# Patient Record
Sex: Male | Born: 1940 | Race: White | Hispanic: No | Marital: Married | Smoking: Former smoker
Health system: Southern US, Community
[De-identification: ages and names within clinical notes are randomized; demographics above are authoritative.]

## PROBLEM LIST (undated history)

## (undated) DIAGNOSIS — M1990 Unspecified osteoarthritis, unspecified site: Secondary | ICD-10-CM

## (undated) DIAGNOSIS — H269 Unspecified cataract: Secondary | ICD-10-CM

## (undated) DIAGNOSIS — I219 Acute myocardial infarction, unspecified: Secondary | ICD-10-CM

## (undated) DIAGNOSIS — K635 Polyp of colon: Secondary | ICD-10-CM

## (undated) DIAGNOSIS — K5790 Diverticulosis of intestine, part unspecified, without perforation or abscess without bleeding: Secondary | ICD-10-CM

## (undated) DIAGNOSIS — I25810 Atherosclerosis of coronary artery bypass graft(s) without angina pectoris: Secondary | ICD-10-CM

## (undated) DIAGNOSIS — Z9289 Personal history of other medical treatment: Secondary | ICD-10-CM

## (undated) DIAGNOSIS — N529 Male erectile dysfunction, unspecified: Secondary | ICD-10-CM

## (undated) DIAGNOSIS — I255 Ischemic cardiomyopathy: Secondary | ICD-10-CM

## (undated) DIAGNOSIS — I38 Endocarditis, valve unspecified: Secondary | ICD-10-CM

## (undated) DIAGNOSIS — K219 Gastro-esophageal reflux disease without esophagitis: Secondary | ICD-10-CM

## (undated) DIAGNOSIS — K449 Diaphragmatic hernia without obstruction or gangrene: Secondary | ICD-10-CM

## (undated) DIAGNOSIS — E785 Hyperlipidemia, unspecified: Secondary | ICD-10-CM

## (undated) DIAGNOSIS — K250 Acute gastric ulcer with hemorrhage: Secondary | ICD-10-CM

## (undated) DIAGNOSIS — I10 Essential (primary) hypertension: Secondary | ICD-10-CM

## (undated) DIAGNOSIS — I517 Cardiomegaly: Secondary | ICD-10-CM

## (undated) DIAGNOSIS — E1121 Type 2 diabetes mellitus with diabetic nephropathy: Secondary | ICD-10-CM

## (undated) DIAGNOSIS — I252 Old myocardial infarction: Secondary | ICD-10-CM

## (undated) DIAGNOSIS — J449 Chronic obstructive pulmonary disease, unspecified: Secondary | ICD-10-CM

## (undated) HISTORY — DX: Unspecified osteoarthritis, unspecified site: M19.90

## (undated) HISTORY — DX: Unspecified cataract: H26.9

## (undated) HISTORY — DX: Gastro-esophageal reflux disease without esophagitis: K21.9

## (undated) HISTORY — DX: Male erectile dysfunction, unspecified: N52.9

## (undated) HISTORY — DX: Atherosclerosis of coronary artery bypass graft(s) without angina pectoris: I25.810

## (undated) HISTORY — DX: Essential (primary) hypertension: I10

## (undated) HISTORY — DX: Type 2 diabetes mellitus with diabetic nephropathy: E11.21

## (undated) HISTORY — DX: Diverticulosis of intestine, part unspecified, without perforation or abscess without bleeding: K57.90

## (undated) HISTORY — DX: Acute myocardial infarction, unspecified: I21.9

## (undated) HISTORY — DX: Personal history of other medical treatment: Z92.89

## (undated) HISTORY — DX: Old myocardial infarction: I25.2

## (undated) HISTORY — DX: Cardiomegaly: I51.7

## (undated) HISTORY — DX: Ischemic cardiomyopathy: I25.5

## (undated) HISTORY — DX: Hyperlipidemia, unspecified: E78.5

## (undated) HISTORY — DX: Chronic obstructive pulmonary disease, unspecified: J44.9

## (undated) HISTORY — DX: Diaphragmatic hernia without obstruction or gangrene: K44.9

---

## 1998-01-11 HISTORY — PX: CORONARY ARTERY BYPASS GRAFT: SHX141

## 1998-11-13 ENCOUNTER — Inpatient Hospital Stay (HOSPITAL_COMMUNITY): Admission: AD | Admit: 1998-11-13 | Discharge: 1998-11-20 | Payer: Self-pay | Admitting: *Deleted

## 1998-11-13 ENCOUNTER — Encounter: Payer: Self-pay | Admitting: *Deleted

## 1998-11-14 ENCOUNTER — Encounter: Payer: Self-pay | Admitting: Thoracic Surgery (Cardiothoracic Vascular Surgery)

## 1998-11-15 ENCOUNTER — Encounter: Payer: Self-pay | Admitting: Thoracic Surgery (Cardiothoracic Vascular Surgery)

## 1998-11-16 ENCOUNTER — Encounter: Payer: Self-pay | Admitting: Thoracic Surgery (Cardiothoracic Vascular Surgery)

## 1998-11-17 ENCOUNTER — Encounter: Payer: Self-pay | Admitting: Cardiothoracic Surgery

## 1998-12-16 ENCOUNTER — Encounter (HOSPITAL_COMMUNITY): Admission: RE | Admit: 1998-12-16 | Discharge: 1999-03-16 | Payer: Self-pay | Admitting: *Deleted

## 2000-07-16 ENCOUNTER — Emergency Department (HOSPITAL_COMMUNITY): Admission: EM | Admit: 2000-07-16 | Discharge: 2000-07-16 | Payer: Self-pay | Admitting: Emergency Medicine

## 2002-03-21 ENCOUNTER — Other Ambulatory Visit: Admission: RE | Admit: 2002-03-21 | Discharge: 2002-03-21 | Payer: Self-pay | Admitting: Family Medicine

## 2007-02-03 ENCOUNTER — Ambulatory Visit (HOSPITAL_BASED_OUTPATIENT_CLINIC_OR_DEPARTMENT_OTHER): Admission: RE | Admit: 2007-02-03 | Discharge: 2007-02-03 | Payer: Self-pay | Admitting: Urology

## 2008-01-12 LAB — HM COLONOSCOPY

## 2008-05-03 ENCOUNTER — Ambulatory Visit: Payer: Self-pay | Admitting: Gastroenterology

## 2008-05-11 DIAGNOSIS — K449 Diaphragmatic hernia without obstruction or gangrene: Secondary | ICD-10-CM

## 2008-05-11 DIAGNOSIS — K635 Polyp of colon: Secondary | ICD-10-CM

## 2008-05-11 HISTORY — DX: Diaphragmatic hernia without obstruction or gangrene: K44.9

## 2008-05-11 HISTORY — DX: Polyp of colon: K63.5

## 2008-05-24 ENCOUNTER — Encounter: Payer: Self-pay | Admitting: Gastroenterology

## 2008-05-24 ENCOUNTER — Ambulatory Visit: Payer: Self-pay | Admitting: Gastroenterology

## 2008-05-27 ENCOUNTER — Encounter: Payer: Self-pay | Admitting: Gastroenterology

## 2008-09-24 ENCOUNTER — Emergency Department (HOSPITAL_COMMUNITY): Admission: EM | Admit: 2008-09-24 | Discharge: 2008-09-25 | Payer: Self-pay | Admitting: Emergency Medicine

## 2010-05-26 NOTE — Op Note (Signed)
Donald Rios, Donald Rios                ACCOUNT NO.:  [id number]   MEDICAL RECORD NO.:  [id number]          PATIENT TYPE:  AMB   LOCATION:  [facility]                         FACILITY:  [facility]   PHYSICIAN:  Mark C. Vernie Ammons, M.D.  DATE OF BIRTH:  1940/12/27   DATE OF PROCEDURE:  02/03/2007  DATE OF DISCHARGE:                               OPERATIVE REPORT   PREOPERATIVE DIAGNOSIS:  Organic erectile dysfunction.   POSTOPERATIVE DIAGNOSIS:  Organic erectile dysfunction.   PROCEDURE:  Three-piece inflatable penile prosthesis placement.   SURGEON:  Mark C. Vernie Ammons, M.D.   ANESTHESIA:  General.   DRAIN:  A 16-French Foley catheter.   BLOOD LOSS:  Minimal.   SPECIMENS:  None.   COMPLICATIONS:  None.   INDICATIONS:  The patient is a 70 year old white male who has organic  erectile dysfunction and has tried the oral agents without improvement.  I have discussed all other treatment options with him and he has elected  to proceed with penile prosthesis.  He understands the risks,  complications and limitations.   DESCRIPTION OF OPERATION:  After informed consent, the patient was  brought to the major OR, placed on the table and administered general  anesthesia in the supine position.  His genitalia was scrubbed for 10  minutes and then prepped with Betadine.  He received Unasyn and  gentamicin prior to the incision.  Official time-out was then performed  and he was sterilely draped.  A 16-French Foley catheter was placed in  the bladder and the bladder was drained.   A midline median raphe scrotal incision was then made and carried down  to expose the corpus cavernosum on both right and left sides.  The ring  retractor was then placed surrounding the scrotum and the supplied hooks  were then used to retract the scrotal skin and exposed the corpora.  Corporotomy was then made on the left side and extended proximally and  distally with curved Mayo scissors.  I then dilated the left corpus  cavernosum with Hagar dilators starting at 12 and progressing up to 15  both proximally and distally without difficulty.  I then irrigated the  corpus and then measured this.  A stay suture was placed on the two  edges of the corporotomy and this was used to determine the location to  measure 2.  He measured 9 cm proximally and 11 cm distally.  The corpus  cavernosum was then again irrigated with antibiotic solution and  attention was directed to the right corpus cavernosum.  This was entered  and dilated as well as measured in an identical fashion.  The  measurements were identical on the right side as they were the left.   Attention was then directed to the external inguinal ring on the right-  hand side.  I could easily palpate the cord and manipulated this  laterally and then drained the bladder completely through the Foley  catheter.  A clamp was then placed against my finger and I used this to  pierce the medial aspect of the floor of the inguinal ring.  I then  used  my index finger to widen the opening and sweep the bladder medially and  clear a pocket against the symphysis pubis.  I then chose the 75 mL  reservoir and placed this in that location after irrigating the location  with antibiotic solution.  I then filled the reservoir with 75 mL of  sterile saline.   Attention was then directed to the penis again and a Titan OTR  prosthesis was chosen that had cylinders of 16 cm length.  I then  applied a 1 cm followed by a 3 cm rear tip extender on each of the two  cylinders prior to implantation.  The cylinders and pump had been  soaking in antibiotic solution.  Using a Mellody Dance needle and the furlough  inserter, the string affixed to the distal aspect of the prosthesis was  then used to place the Grayson needle through the distal aspect of the  glans on both right and left sides and then I inserted the proximal  aspect of the prosthesis into each of the corporotomies and down into   good proximal position.  The strings affixed to the distal aspect of the  prosthesis were then used to direct the remainder of the prosthesis into  the corpora distally.  I then inflated the prosthesis to its full  capacity and noted no kinking of the device, no bulging and good  straight erection with both of the cylinders being palpated just beneath  the glans in the equidistant in the corpus cavernosum.  I then deflated  the device and closed the corporotomies with running 2-0 Vicryl suture.  I also used the holding sutures and tied these to reinforce the  corporotomy closure.  No bleeding was noted from the corporotomies at  the end of the procedure with the device deflated.  I then developed a  scrotal pocket.   The scrotal pocket was developed on the right-hand side as the patient  is right-handed, and the pump was then placed in the scrotal pocket and  secured in that location with a 3-0 chromic suture by reapproximating  the tissues superior to the pump.  I then placed shod clamps on the  tubing near the external inguinal ring and near the pump and excised the  excess tubing.  I used the supplied connectors to connect the tubings  after irrigating these with sterile saline.  I tested the connection and  I noted it was secure.  I then cycled the device which cycled properly.  I then deflated the device and copiously irrigated the incision with  antibiotic solution.  I closed the incision in three layers using 3-0  chromic suture.  One layer was used to reapproximate the deep scrotal  tissue.  The superficial subcutaneous tissue was then reapproximated and  then the skin was finally closed with running 3-0 chromic.  I applied  collodion to the incision as well and left the Foley catheter indwelling  and connected this to close system drainage.  Fluffed Kerlix and scrotal  support were then applied with the penis directed cephalad and the  patient was awakened and taken to recovery  room in stable satisfactory  condition.  He tolerated the procedure well.  There were no  intraoperative complications.   He will be observed overnight and continued on Unasyn and gentamicin as  well as supplied with oral as well as parenteral pain medication.  Upon  discharge, he will be given a prescription for Tylox #40 and maintained  on Cipro 500 mg b.i.d. for  10 days.  He will follow up in my office in  one week.      Mark C. Vernie Ammons, M.D.  Electronically Signed     MCO/MEDQ  D:  02/03/2007  T:  02/03/2007  Job:  [id number]

## 2010-05-29 NOTE — Discharge Summary (Signed)
. [facility]  Patient:    Donald Rios                        MRN: [id number] Adm. Date:  [id number] Disc. Date: 11/20/98 Attending:  Lenise Herald Rios Dictator:   Donald Rios. CC:         Donald Rios. Donald Rios, M.D.             Donald Herald, M.D.             Elvina Sidle, M.D.                           Discharge Summary  HISTORY OF PRESENT ILLNESS:  [name] is a 70 year old male referred to Dr. Jenne Rios from Dr. Tommi Rios office.  The patient has a history of hypertension x 2 years as well as hyperlipidemia.  He recently underwent a screening physical examination by Dr. Milus Rios including a stress Cardiolite.  This was performed October 29, 1998, at Dr. Loraine Rios office and revealed a markedly positive Bruce  protocol exercise stress test with cinographic evidence of inferolateral scar and peri-infarct ischemia with a moderately depressed ejection fraction of 35%. The patient has no history of coronary artery disease.  He worked daily and has no congestive heart failure or anginal symptoms.  He has no significant family history of coronary artery disease.  There is remote tobacco use having quit cigarettes 13 years ago.  Dr. Milus Rios placed the patient on Toprol XL for his blood pressure as well as Lipitor.  He was admitted this hospitalization under the direction of Dr. Loraine Rios service for cardiac catheterization and further evaluation and treatment pending those results.  MEDICATIONS ON ADMISSION: 1. Lipitor 10 mg q.d. 2. Toprol XL 25 mg a day.  ALLERGIES:  No known drug allergies.  SOCIAL HISTORY, FAMILY HISTORY, REVIEW OF SYSTEMS, AND PHYSICAL EXAMINATION: Please see the History and Physical done at the time of admission.  HOSPITAL COURSE:  The patient was admitted for cardiac catheterization on a routine basis on November 13, 1998,  He was found to have a left ventricular function of 35 to 40%, inferior  akinesis, moderate global hypokinesis, and no significant mitral regurgitation.  The patient was noted to have a 60% left main coronary lesion, n 80% LAD ostial lesion, a 90% LAD mid artery lesion, a normal circumflex, a right coronary artery with 90% proximal PDA disease and evidence of less than 50% lesions in several regions of the artery.  A CVTS consultation was obtained with Donald Rios, M.D. who evaluated the patient and his studies and agreed with recommendations for surgical revascularization.  PROCEDURE:  On November 14, 1998, the patient underwent the following procedure:  Coronary artery bypass graft x 5.  The following grafts were placed. 1. Left internal mammary artery sequentially to the diagonal and LAD. 2. Saphenous vein graft sequentially to posterior descending and posterolateral. 3. Free right internal mammary artery to the obtuse marginal #2. Cross clamp time 72 minutes, pump time 136 minutes.  The patient tolerated the procedure well, required no inotropic agents.  He was noted intraoperatively to  have good targets and good conduit.  POSTOPERATIVE HOSPITAL COURSE:  The patient has done quite well.  He tolerated  routine extubation.  He hemodynamics have remained stable, although he did have a postoperative sinus tachycardia that did require several adjustments in medications.  His chest tubes  and other routine lines and catheters were discontinued in a routine manner.  He advanced to the cardiac telemetry unit on  postoperative day #2.  He tolerated routine advancement in postoperative cardiac rehabilitation phase 1 modalities.  His incisions were healing quite well without signs of infection.  He was tolerating diet.  His oxygen was weaned, and he maintained good saturations on room air.  He did have some mild difficulty with  some bronchospasm, but this normalized with routine use of both albuterol and Atrovent hand-held nebulizers with  conversion to a metered dose inhaler at the ime of discharge.  Overall, the patient was evaluated on November 20, 1998, and felt to be quite stable for discharge at that time.  DISCHARGE MEDICATIONS: 1. Combivent 2 puffs q.i.d. 2. Digoxin 0.125 mg q.d. 3. Enteric-coated aspirin 325 mg q.d. 4. Lopressor 50 mg b.i.d. 5. Darvocet-N 100 one to two q.4-6h. p.r.n.  FOLLOWUP:  It was arranged for patient to see Dr. Dorris Rios in three weeks, Dr. Jenne Rios in two weeks.  FINAL DIAGNOSIS: 1. Coronary artery disease.  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 2. Hypertension. 3. Hypercholesterolemia.  DISCHARGE LABORATORY DATA:   Laboratory values at the time of discharge were felt to be stable.  Most recent hemoglobin and hematocrit dated 19 November 1998 were 10 and 30, respectively, with a normal platelet and normal white blood cell count.  Electrolytes were normal also on 19 November 1998 with the exception of a mildly  elevated nonfasting glucose of 175.  BUN and creatinine were 15 and 0.9.  INSTRUCTIONS:  The patient received written instructions regarding medications,  activity, diet, wound care, and followup. DD:  01/14/99 TD:  01/14/99 Job: [id number]

## 2010-05-29 NOTE — Op Note (Signed)
. [facility]  Patient:    Donald Rios                        MRN: [id number] Proc. Date: 11/14/98 Adm. Date:  [id number] Attending:  Lenise Herald H CC:         Lenise Herald, M.D.             Elvina Sidle, M.D.                           Operative Report  PREOPERATIVE DIAGNOSIS:  Left main coronary disease.  POSTOPERATIVE DIAGNOSIS:  Left main coronary disease.  OPERATION PERFORMED:  Median sternotomy, coronary artery bypass grafting x 5 (sequential left internal mammary artery to first diagonal and left anterior descending, free right internal mammary artery to obtuse marginal 2, sequential  saphenous vein graft to posterior descending and posterolateral).  SURGEON:  Salvatore Decent. Dorris Fetch, M.D.  ASSISTANT:  Lynnda Shields, PA-C.  ANESTHESIA:  General.  FINDINGS:  Good quality target vessels, good quality conduits, overall preserved left ventricular function.  CLINICAL NOTE:  Mr. Bowns is a 70 year old gentleman who presented to Dr. Milus Glazier for physical examination.  He underwent an exercise tolerance test with a stress Cardiolite test which revealed significant inferior ischemia, inferior  scarring and an ejection fraction of 35 to 40%.  The patient was referred for cardiac catheterization which revealed an 80% distal left main stenosis as well as significant disease in the LAD system and significant disease in the right coronary system as well.  Of note, the patient had a dual system with a bifurcating vessel one of which gave off several large septal branches and the other gave off several small diagonal branches.  The patient was referred for coronary artery bypass grafting.  The indications, risks, and benefits of the procedure were discussed in detail with the patient.  The patient understood the risks and agreed to proceed.  DESCRIPTION OF PROCEDURE:  Mr. Ksiazek was brought to the preop holding area on November 14, 1998.  Lines were placed to monitor arterial central venous and pulmonary arterial pressure.  EKG leads were placed for continuous telemetry. he patient was taken to the operating room, anesthetized and intubated.  A Foley catheter was placed.  Intravenous antibiotics were administered.  The chest, abdomen and legs were prepped and draped in the usual fashion.  A median sternotomy was performed.  Simultaneously, an incision was made in the  medial aspect of the left leg and the greater saphenous vein was harvested from the ankle to the midcalf.  The greater saphenous vein was of good quality.  The left internal mammary artery was harvested in the standard fashion.  It was a good quality condiut, approximately 2 to 2.5 mm in diameter.  The patient was given 000 units of heparin prior to dividing the distal end of the mammary artery.  The right internal mammary artery then was harvested in the same fashion.  The remainder f the heparin was given before dividing the distal end of the right internal mammary artery.  It was likewise a good graft approximately 2 mm in diameter.  There was excellent flow through the cut end of the right mammary.  After ensuring that all branches had been clipped, the right internal mammary artery then was divided proximally.  A right ankle clamp was placed across the proximal mammary artery.  The graft was divided  and placed in a papaverine heparinized saline solution. he proximal stump then was ligated with a 2-0 silk suture ligature.  The pericardium was opened.  The ascending aorta was palpated.  It was normal size diameter and there was no palpable atherosclerotic disease.  The aorta was cannulated via concentric 2-0 Ethibond nonpledgeted pursestring sutures.  The dual stage venous cannula was place via pursestring suture in the right atrial appendage.  Cardiopulmonary bypass was instituted and the patient was cooled to 32 degrees Celsius.  The  coronary arteries were inspected and anastomotic sites chosen.  The conduits were inspected and cut to length.  A foam pad was placed n the pericardium to protect the left phrenic nerve.  A temperature probe was placed in the myocardial septum and a cardioplegia cannula was placed in the ascending  aorta.  The aorta was crossclamped.  The left ventricle was emptied via the aortic root  vent.  Cardiac arrest then was achieved with a combination of cold antegrade cardioplegia and topical iced saline.  After achieving a complete diastolic arrest and myocardial septal temperature of 90 degrees, the following distal anastomoses were performed.  First a reversed saphenous vein was placed sequentially to the posterior descending coronary artery and posterolateral branches of the right coronary artery.  The posterior descending was a 1.5 mm good quality vessel as was the posterolateral  branch.  The anastomosis to the posterior descending coronary artery was a side-to-side anastomosis performed off a side branch of the vein graft.  The anastomosis was performed with a running 7-0 Prolene suture.  An end-to-side anastomosis then was performed to the posterolateral branch also with a running 7-0 Prolene suture.  Both anastomoses were probed proximally and distally with 1.5 m probes prior to tying the sutures.  There was good flow down the vein graft. Additional cardioplegia was administered down the vein graft.  Next the free right internal mammary artery was prepared by spatulating its distal end.  It was anastomosed end-to-side to the second obtuse marginal branch of the left circumflex coronary artery.  This was compromised by the 80% distal left main stenosis.  This anastomosis was performed with a running 8-0 Prolene suture and  again was probed proximally and distally prior to tying the suture.  The second  obtuse marginal was a 1.5 mm good quality vessel.  Additional  cardioplegia was administered via the vein graft and via the aortic root.  Next, the left internal mammary artery was brought through a window in the  pericardium anterior to the left phrenic nerve and was prepared to be sequentially grafted to the first diagonal and LAD.  This was actually a dual LAD system with the first diagonal being a large branch which gave off several smaller diagonals and the LAD proper being an equal sized vessel that gave off septal perforators. It did not run completely to the apex of the heart.  There was a very small branch of the LAD which did run in the normal course along the ventricular septum to the apex of the heart.  This branch was not graftable to its extremely small size. The LAD then was anastomosed sequentially to the diagonal and LAD.  A side-to-side anastomosis was performed to the diagonal and an end-to-side anastomosis was perfomred to the LAD.  Both anastomoses were performed with running 8-0 Prolene  sutures.  Both were probed proximally and distally prior to tying the sutures. At completion of the mammary to LAD anastomosis, the bulldog clamp was  removed from the mammary artery.  Immediate and rapid septal rewarming was noted.  Both anastomoses were inspected for hemostasis.  The mammary pedicle was tacked to the epicardial surface of the heart with 6-0 Prolene sutures.  The aortic crossclamp was removed.  Total crossclamp time was 72 minutes.  The patient resumed contractions spontaneously and did not require defibrillation.  A partial occlusion clamp was placed on the ascending aorta.  The cardioplegia cannula was removed.  The proximal anastomoses were performed to 4.0 mm punch aortotomies.  The right internal mammary artery anastomosis was performed with  running 7-0 Prolene suture and the vein graft with a running 6-0 Prolene suture. As the vein graft proximal was completed, a bulldog clamp was removed from the ree right  internal mammary artery graft.  There was backbleeding into the aorta which allowed evacuation of all the air before removal of the partial occlusion clamp. All proximal and distal anastomoses were inspected for hemostasis.  Epicardial pacing wires were placed on the right ventricle and right atrium.  The patient as rewarmed.  Core temperature reached 37 degrees Celsius.  The patient was weaned  from bypass at that time.  He was in sinus tachycardia on no inotropic support t the time of separation from bypass.  Initial cardiac output was 6 liters per minute.  The patient remained hemodynamically stable throughout the postbypass period.  A test dose of protamine was administered and was well tolerated.  The atrial and aortic cannulae were removed.  The remainder of the protamine was administered without incident.  An addition dose of antibiotics was given.  The chest was irrigtaed with 1 liter of warm normal saline containing 1 gm of vancomycin. Hemostasis was achieved.  The chest was irrigated with 1 liter of warm normal saline containing 1 gm of vancomycin.  Bilateral pleural and two mediastinal chest tubes were placed through separate subcostal incisions.  The mediastinal fat was reapproximated over the heart to prevent adherence to the sternum.  The sternum was closed with stainless steel wires.  The pectoralis fascia was closed with a running #1 Vicryl suture.  Both the chest and leg subcutaneous tissues was closed with  running 2-0 Vicryl suture and the skin was closed with 3-0 Vicryl subcuticular suture.  All sponge, needle and instrument counts were correct at the completion of the procedure.  There were no intraoperative complications.  The patient was taken from the operating room to the surgical intensive care unit intubated in stable  condition. DD:  11/14/98 TD:  11/17/98 Job: [id number]

## 2010-05-29 NOTE — Cardiovascular Report (Signed)
[place]  Patient:    Donald Rios                        MRN: [id number] Proc. Date: 11/13/98 Adm. Date:  [id number] Attending:  Darlin Priestly CC:         Dr. Faustino Congress                        Cardiac Catheterization  PROCEDURES PERFORMED: 1. Left heart catheterization. 2. Coronary angiography. 3. Left ventriculogram.  COMPLICATIONS:  None.  INDICATIONS:  [name] is a 70 year old white male, patient of Dr. Faustino Congress, with a history of hypertension and hyperlipidemia.  He recently underwent a screening physical examination by Dr. Faustino Congress, including a stress Cardiolite  which revealed inferolateral scar with peri-infarct ischemia and a mildly depressed EF at 35%.  He was subsequently referred for cardiac catheterization.  DESCRIPTION OF PROCEDURE:  After giving informed written consent, the patient was brought to the cardiac catheterization laboratory where his right and left groins were shaved, prepped, and draped in the usual sterile fashion.  ECG monitoring as established.  Using the modified Seldinger technique, a 6-French arterial sheath was inserted into the right femoral artery.  Six French diagnostic catheters were then used to perform diagnostic angiography. This revealed a medium sized left main with a 60% distal stenotic lesion.  The LAD was a medium sized vessel which coursed the apex and gave rise to two diagonal branches.  The LAD was noted to have an 80% ostial stenotic lesion and a 90% mid vessel stenotic lesion after the takeoff of the first diagonal.  The first diagonal was a large vessel which bifurcated distally and had no significant disease.  The second diagonal was a small vessel with no significant disease.  The left circumflex was a medium sized vessel which coursed in the AV groove and gave rise to one large bifurcating obtuse marginal.  The circumflex proper had o significant disease.  The  first OM was a medium sized vessel with no significant disease.  The right coronary artery was a medium sized vessel which bifurcated into the PDA and posterolateral branch and was noted to be dominant.  The RCA had a 30% early mid and a 30% late mid stenotic lesion.  The PDA was a medium sized vessel with no significant disease.  The PLA was a large vessel with a 90% ostial and a 30% mid vessel stenotic lesion.  Left ventriculogram revealed moderately depressed LV systolic function with an estimated ejection fraction of 35% to 40%.  There is inferior akinesis and moderate global hypokinesis.  There is no significant mitral regurgitation.  HEMODYNAMICS:  Systemic pressure 163/75, LV systemic pressure 163/8, LV EDP 15.   CONCLUSIONS: 1. 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 2. Moderately depressed left ventricular systolic function with wall motion    abnormalities, as noted above.  DISCUSSION:  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  Other than a history of hypertension and hyperlipidemia, he has no other known risk factors.  I believe he would be best managed by coronary artery bypass graft surgery secondary to his left main disease.  We will begin him on IV heparin and ask CVTS to see him in consultation. DD:  11/13/98 TD:  11/15/98 Job: [id number]

## 2010-07-27 ENCOUNTER — Encounter: Payer: Self-pay | Admitting: Family Medicine

## 2010-07-27 DIAGNOSIS — I340 Nonrheumatic mitral (valve) insufficiency: Secondary | ICD-10-CM | POA: Insufficient documentation

## 2010-07-27 DIAGNOSIS — E119 Type 2 diabetes mellitus without complications: Secondary | ICD-10-CM | POA: Insufficient documentation

## 2010-07-27 DIAGNOSIS — I517 Cardiomegaly: Secondary | ICD-10-CM | POA: Insufficient documentation

## 2010-07-27 DIAGNOSIS — E782 Mixed hyperlipidemia: Secondary | ICD-10-CM | POA: Insufficient documentation

## 2010-07-27 DIAGNOSIS — K449 Diaphragmatic hernia without obstruction or gangrene: Secondary | ICD-10-CM | POA: Insufficient documentation

## 2010-07-27 DIAGNOSIS — I10 Essential (primary) hypertension: Secondary | ICD-10-CM | POA: Insufficient documentation

## 2010-07-27 DIAGNOSIS — I071 Rheumatic tricuspid insufficiency: Secondary | ICD-10-CM | POA: Insufficient documentation

## 2010-07-27 DIAGNOSIS — I25810 Atherosclerosis of coronary artery bypass graft(s) without angina pectoris: Secondary | ICD-10-CM | POA: Insufficient documentation

## 2010-07-27 DIAGNOSIS — K5790 Diverticulosis of intestine, part unspecified, without perforation or abscess without bleeding: Secondary | ICD-10-CM | POA: Insufficient documentation

## 2010-07-27 DIAGNOSIS — H9190 Unspecified hearing loss, unspecified ear: Secondary | ICD-10-CM | POA: Insufficient documentation

## 2010-07-27 DIAGNOSIS — K219 Gastro-esophageal reflux disease without esophagitis: Secondary | ICD-10-CM | POA: Insufficient documentation

## 2010-07-27 DIAGNOSIS — M109 Gout, unspecified: Secondary | ICD-10-CM | POA: Insufficient documentation

## 2010-08-07 DIAGNOSIS — Z9289 Personal history of other medical treatment: Secondary | ICD-10-CM

## 2010-08-07 HISTORY — DX: Personal history of other medical treatment: Z92.89

## 2010-09-07 HISTORY — PX: TRANSTHORACIC ECHOCARDIOGRAM: SHX275

## 2010-10-01 LAB — POCT I-STAT 4, (NA,K, GLUC, HGB,HCT)
Glucose, Bld: 119 — ABNORMAL HIGH
HCT: 46
Hemoglobin: 15.6
Operator id: 268271
Potassium: 4.4
Sodium: 141

## 2011-01-13 DIAGNOSIS — M109 Gout, unspecified: Secondary | ICD-10-CM | POA: Diagnosis not present

## 2011-01-13 DIAGNOSIS — E119 Type 2 diabetes mellitus without complications: Secondary | ICD-10-CM | POA: Diagnosis not present

## 2011-01-13 DIAGNOSIS — J449 Chronic obstructive pulmonary disease, unspecified: Secondary | ICD-10-CM | POA: Diagnosis not present

## 2011-01-18 DIAGNOSIS — N390 Urinary tract infection, site not specified: Secondary | ICD-10-CM | POA: Diagnosis not present

## 2011-02-02 DIAGNOSIS — E782 Mixed hyperlipidemia: Secondary | ICD-10-CM | POA: Diagnosis not present

## 2011-02-02 DIAGNOSIS — Z79899 Other long term (current) drug therapy: Secondary | ICD-10-CM | POA: Diagnosis not present

## 2011-02-05 DIAGNOSIS — E119 Type 2 diabetes mellitus without complications: Secondary | ICD-10-CM | POA: Diagnosis not present

## 2011-02-05 DIAGNOSIS — I2510 Atherosclerotic heart disease of native coronary artery without angina pectoris: Secondary | ICD-10-CM | POA: Diagnosis not present

## 2011-05-18 DIAGNOSIS — E785 Hyperlipidemia, unspecified: Secondary | ICD-10-CM | POA: Diagnosis not present

## 2011-05-18 DIAGNOSIS — R5381 Other malaise: Secondary | ICD-10-CM | POA: Diagnosis not present

## 2011-05-18 DIAGNOSIS — R5383 Other fatigue: Secondary | ICD-10-CM | POA: Diagnosis not present

## 2011-05-18 DIAGNOSIS — E119 Type 2 diabetes mellitus without complications: Secondary | ICD-10-CM | POA: Diagnosis not present

## 2011-06-28 ENCOUNTER — Encounter: Payer: Self-pay | Admitting: Dietician

## 2011-06-28 ENCOUNTER — Encounter: Payer: Medicare Other | Attending: Family Medicine | Admitting: Dietician

## 2011-06-28 DIAGNOSIS — E119 Type 2 diabetes mellitus without complications: Secondary | ICD-10-CM | POA: Insufficient documentation

## 2011-06-28 DIAGNOSIS — Z713 Dietary counseling and surveillance: Secondary | ICD-10-CM | POA: Insufficient documentation

## 2011-06-28 NOTE — Progress Notes (Signed)
  Medical Nutrition Therapy:  Appt start time: 1630 end time:  1730.   Assessment:  Primary concerns today: Learn why my blood glucose might be going low in the morning after breakfast and dropping to 76 at that time. Gives a history of type 2 diabetes for about 4-6 years.  DM education was provided by his MD and the resources that he has read and on-line self-education.  Notes that he limits himself to 45 gm of Carb per meal as a rule.  MEDICATIONS: Med review completed   BLOOD GLUCOSE: Checking fasting levels 2-3-4 days per week.  He is unsure of his current A1C level.  HYPOGLYCEMIA:  Reports some recent episodes of getting tired and somewhat weak mid morning while chopping wood. At one episode, he checked his blood glucose and found it to be 76.  He has found that if he eats lunch early, this takes away the tired feeling and he has the energy he needs to go about his afternoon.  DIETARY INTAKE:  Usual eating pattern includes 3 meals and rare snacks per day.  Everyday foods include meat/protein, vegetables, some fruit.  Avoided foods include bread and sweets.    24-hr recall:  B ( AM): 6:30 Oatmeal (1-1.5 packagespackage of instant and add a few raisins), coffee (1/2 and 1/2 nonfat)  OR cheerios (regular 1 cup  with skim milk 4 oz) OR sausage biscuit prepared sausage with biscuit OR waffle 1 regular with made at home, frozen mix with syrup (sugar free).  Every 2 weeks has eggs, bacon, and grits (get these at the restaurant). Snk ( AM): none  L ( PM): 12:00 PM- roast beef (3 oz), frito chips 5-6 and 2 bottles of water.  Uses leftovers and always has on hand, pork skins with pimento cheese on these and have water. Snk ( PM): none D ( PM): 6:00 PM Peppers, onions, chicken. Steak, salad and baked beans, or garlic bread.water or if out to dinner will have unsweetened tea. Snk ( PM): none Beverages:coffee, water, diet coke and bottled green tea,  occ beer. Tends to eat out Saturday and Sunday  nights and will bring back the leftovers.  Usual physical activity: working around the house and will walk on the treadmill 2-3 days per week for 20-30 minutes.  Estimated energy needs:HT: 68.5 in  WT: 159.1 lb  BMI: 23.9 kg/m2   1800-2000 calories (depending on activity level) 195-200 g carbohydrates 130-135 g protein 46-48 g fat  Progress Towards Goal(s):  In progress.   Nutritional Diagnosis:  [place]-2.1 Inpaired nutrition utilization As related to glucose.  As evidenced by diagnosis of type 2 diabetes, use of Glumetza, having episodes of low glucose with increased activity..    Intervention:  Nutrition Review of the carb restricted diet along with the other factors that influence blood glucose and how they along with diet influence blood glucose levels. Advised that on the days that he is doing more physical activity/labor, he Christohper Dube need to add another 15 gm of carbohydrate along with some protein to his breakfast or he Shayonna Ocampo need to plan to have a snack at the mid-morning time during his work to include at least 15 gm of CHO and some protein.  Handouts given during visit include:  Living Well with Diabetes  Carb Counting Reference  Controlling Blood Glucose  Monitoring/Evaluation:  Dietary intake, exercise, blood glucose levesl, and body weight as needed.

## 2011-06-28 NOTE — Patient Instructions (Addendum)
   On mornings you are going out to work, you might add a slice of whole wheat or whole grain toast, with the PB OR carry a pack of crackers for a mid-morning snack and plan to stop and have the snack.  (15 grams + protein).  Always [place] the higher fiber crackers, breads.  When at the house, check the blood glucose levels to make sure it is a low glucose reaction and not the caffeine from too much coffee.  Continue to stay as active as possible.  Use the stairs, keep up the outside work.  Continue to monitor your diet as you are doing.  Keep the carbs at about 45 grams of carb per meal and a snack as need of 15 grams of carb and a protein.

## 2011-06-29 ENCOUNTER — Encounter: Payer: Self-pay | Admitting: Dietician

## 2011-08-11 DIAGNOSIS — E782 Mixed hyperlipidemia: Secondary | ICD-10-CM | POA: Diagnosis not present

## 2011-08-11 DIAGNOSIS — E119 Type 2 diabetes mellitus without complications: Secondary | ICD-10-CM | POA: Diagnosis not present

## 2011-08-16 DIAGNOSIS — M6281 Muscle weakness (generalized): Secondary | ICD-10-CM | POA: Diagnosis not present

## 2011-08-16 DIAGNOSIS — I10 Essential (primary) hypertension: Secondary | ICD-10-CM | POA: Diagnosis not present

## 2011-08-16 DIAGNOSIS — E119 Type 2 diabetes mellitus without complications: Secondary | ICD-10-CM | POA: Diagnosis not present

## 2011-08-16 DIAGNOSIS — M353 Polymyalgia rheumatica: Secondary | ICD-10-CM | POA: Diagnosis not present

## 2011-08-30 DIAGNOSIS — Z125 Encounter for screening for malignant neoplasm of prostate: Secondary | ICD-10-CM | POA: Diagnosis not present

## 2011-08-30 DIAGNOSIS — E291 Testicular hypofunction: Secondary | ICD-10-CM | POA: Diagnosis not present

## 2011-09-23 DIAGNOSIS — M353 Polymyalgia rheumatica: Secondary | ICD-10-CM | POA: Diagnosis not present

## 2011-09-23 DIAGNOSIS — M109 Gout, unspecified: Secondary | ICD-10-CM | POA: Diagnosis not present

## 2011-12-06 DIAGNOSIS — E1139 Type 2 diabetes mellitus with other diabetic ophthalmic complication: Secondary | ICD-10-CM | POA: Diagnosis not present

## 2012-02-01 DIAGNOSIS — J019 Acute sinusitis, unspecified: Secondary | ICD-10-CM | POA: Diagnosis not present

## 2012-02-10 DIAGNOSIS — E119 Type 2 diabetes mellitus without complications: Secondary | ICD-10-CM | POA: Diagnosis not present

## 2012-02-10 DIAGNOSIS — Z79899 Other long term (current) drug therapy: Secondary | ICD-10-CM | POA: Diagnosis not present

## 2012-02-10 DIAGNOSIS — E782 Mixed hyperlipidemia: Secondary | ICD-10-CM | POA: Diagnosis not present

## 2012-02-15 DIAGNOSIS — E782 Mixed hyperlipidemia: Secondary | ICD-10-CM | POA: Diagnosis not present

## 2012-02-15 DIAGNOSIS — I10 Essential (primary) hypertension: Secondary | ICD-10-CM | POA: Diagnosis not present

## 2012-02-15 DIAGNOSIS — Z951 Presence of aortocoronary bypass graft: Secondary | ICD-10-CM | POA: Diagnosis not present

## 2012-04-11 ENCOUNTER — Ambulatory Visit (INDEPENDENT_AMBULATORY_CARE_PROVIDER_SITE_OTHER): Payer: Medicare Other | Admitting: Family Medicine

## 2012-04-11 ENCOUNTER — Encounter: Payer: Self-pay | Admitting: Family Medicine

## 2012-04-11 VITALS — BP 120/70 | HR 58 | Temp 97.7°F | Resp 14 | Wt 165.0 lb

## 2012-04-11 DIAGNOSIS — E785 Hyperlipidemia, unspecified: Secondary | ICD-10-CM | POA: Diagnosis not present

## 2012-04-11 DIAGNOSIS — I10 Essential (primary) hypertension: Secondary | ICD-10-CM

## 2012-04-11 DIAGNOSIS — M109 Gout, unspecified: Secondary | ICD-10-CM

## 2012-04-11 DIAGNOSIS — E119 Type 2 diabetes mellitus without complications: Secondary | ICD-10-CM | POA: Diagnosis not present

## 2012-04-11 LAB — HEMOGLOBIN A1C
Hgb A1c MFr Bld: 6 % — ABNORMAL HIGH (ref <5.7)
Mean Plasma Glucose: 126 mg/dL — ABNORMAL HIGH (ref <117)

## 2012-04-11 LAB — HEPATIC FUNCTION PANEL
ALT: 14 U/L (ref 0–53)
AST: 19 U/L (ref 0–37)
Bilirubin, Direct: 0.2 mg/dL (ref 0.0–0.3)
Indirect Bilirubin: 0.9 mg/dL (ref 0.0–0.9)
Total Bilirubin: 1.1 mg/dL (ref 0.3–1.2)

## 2012-04-11 LAB — LIPID PANEL
HDL: 51 mg/dL (ref >39)
Total CHOL/HDL Ratio: 2.4 Ratio
Triglycerides: 66 mg/dL (ref <150)

## 2012-04-11 LAB — BASIC METABOLIC PANEL
Chloride: 107 mEq/L (ref 96–112)
Creat: 1.06 mg/dL (ref 0.50–1.35)
Potassium: 4.8 mEq/L (ref 3.5–5.3)

## 2012-04-11 LAB — URIC ACID: Uric Acid, Serum: 4.6 mg/dL (ref 4.0–7.8)

## 2012-04-11 NOTE — Progress Notes (Signed)
Subjective:     Patient ID: Donald Rios, male   DOB: Nov 08, 1940, 72 y.o.   MRN: [id number]  HPI Patient is here for followup of his multiple medical problems.  #1 is diabetes mellitus type 2. He is currently taking metformin 500 mg by mouth every morning. He reports fasting blood sugars of 80-110, and two-hour postprandial sugars less than 130.  He denies any hypoglycemic episodes.  He is strictly adhering to a low carbohydrate diet.  #2 is hypertension- he is currently on Norvasc 10 mg by mouth daily, Lotensin 40 mg by mouth daily, Catapres 0.2 mg twice a day, and Lopressor 50 mg by mouth twice a day.  He denies any chest pain shortness of breath or dyspnea on exertion. He is followed by Dr. [place] Hilty at [facility] heart [facility] vascular.  He does have past medical history significant for mitral regurgitation.  He denies any palpitations.  #3 is hyperlipidemia-he is currently on Zocor40 mg by mouth daily. He denies any myalgias or right upper quadrant pain.  #4  gout he is currently on allopurinol 300 mg by mouth daily. He has not had a flare in 6 months.  He seldom needs to use colchicine.  He also has a well-circumscribed plaque on his knee medial left knee. It has a red raised serpiginous border with central scale. He described the counter cortisone cream without relief.  He has been there for approximately 2-3.PMH Current Outpatient Prescriptions on File Prior to Visit  Medication Sig Dispense Refill  . amLODipine (NORVASC) 10 MG tablet Take 10 mg by mouth daily.        Marland Kitchen aspirin 81 MG tablet Take 81 mg by mouth daily.        . benazepril (LOTENSIN) 40 MG tablet Take 40 mg by mouth daily.        . calcium carbonate (OS-CAL) 600 MG TABS Take 600 mg by mouth 2 (two) times daily with a meal.        . metFORMIN (GLUMETZA) 500 MG (MOD) 24 hr tablet Take 500 mg by mouth daily with breakfast.        . metoprolol (LOPRESSOR) 50 MG tablet Take 50 mg by mouth 2 (two) times daily.        .  simvastatin (ZOCOR) 80 MG tablet Take 80 mg by mouth at bedtime. Having some muscular soreness and has decreased the dose to 40 mg at night.       No current facility-administered medications on file prior to visit.   History   Social History  . Marital Status: Married    Spouse Name: N/A    Number of Children: N/A  . Years of Education: N/A   Occupational History  . Not on file.   Social History Main Topics  . Smoking status: Former Smoker    Quit date: 11/04/1985  . Smokeless tobacco: Never Used  . Alcohol Use: Yes  . Drug Use: Yes  . Sexually Active: Not on file   Other Topics Concern  . Not on file   Social History Narrative  . No narrative on file    Review of Systems Review of systems is otherwise negative    Objective:   Physical Exam  Constitutional: He appears well-developed and well-nourished.  HENT:  Head: Normocephalic.  Eyes: Conjunctivae are normal. Pupils are equal, round, and reactive to light.  Neck: Normal range of motion. Neck supple. No JVD present. No thyromegaly present.  Cardiovascular: Normal rate and regular rhythm.  Murmur (1/6 NEAR MITRAL VALVE.) heard. Pulmonary/Chest: Effort normal and breath sounds normal. No respiratory distress. He has no wheezes. He has no rales.  Abdominal: Soft. Bowel sounds are normal. He exhibits no distension. There is no tenderness. There is no rebound and no guarding.  Lymphadenopathy:    He has no cervical adenopathy.   he has a faint right carotid bruit.  There is a 5 cm, well-circumscribed red plaque with white scale on his medial left knee.  Clinically appears to be T. Corporis.  KOH performed in the office is negative     Assessment:     Diabetes mellitus type 2 Hypertension Hyperlipidemia Gout Tinea corporis    Plan:    1. Gout Well-controlled continue allopurinol 300 mg by mouth daily.  Ensure uric acid level is less than 6. - Uric Acid  2. Type II or unspecified type diabetes mellitus  without mention of complication, not stated as uncontrolled Appears well-controlled. Goal A1c is less than 6.5 - Hemoglobin A1c  3. Essential hypertension, benign Controlled.  Continue current medicines at current doses. - Basic Metabolic Panel  4. Other and unspecified hyperlipidemia Goal LDL is less than 70.  Titrate meds to achieve that - Basic Metabolic Panel - Hepatic Function Panel - Lipid Panel  #4 tinea corporis-lotrimin BID x 14 days. Biopsy if persistent.

## 2012-05-26 ENCOUNTER — Other Ambulatory Visit: Payer: Self-pay | Admitting: Family Medicine

## 2012-05-26 MED ORDER — METFORMIN HCL 500 MG PO TABS: 500.0000 mg | ORAL_TABLET | Freq: Every day | ORAL | Status: DC

## 2012-05-26 MED ORDER — METFORMIN HCL ER (MOD) 500 MG PO TB24: 500.0000 mg | ORAL_TABLET | Freq: Every day | ORAL | Status: DC

## 2012-05-26 NOTE — Telephone Encounter (Signed)
Refilled med

## 2012-05-26 NOTE — Telephone Encounter (Signed)
Rx Refilled  

## 2012-08-11 ENCOUNTER — Telehealth: Payer: Self-pay | Admitting: Internal Medicine

## 2012-08-11 NOTE — Telephone Encounter (Signed)
Message forwarded to J. Elkins, RN.  

## 2012-08-11 NOTE — Telephone Encounter (Signed)
Needing a lab order sent to him before his appt on 08/18/12 with Dr.Hilty

## 2012-08-14 ENCOUNTER — Encounter: Payer: Self-pay | Admitting: *Deleted

## 2012-08-14 ENCOUNTER — Other Ambulatory Visit: Payer: Self-pay | Admitting: *Deleted

## 2012-08-14 DIAGNOSIS — E785 Hyperlipidemia, unspecified: Secondary | ICD-10-CM

## 2012-08-14 DIAGNOSIS — Z79899 Other long term (current) drug therapy: Secondary | ICD-10-CM

## 2012-08-14 DIAGNOSIS — E119 Type 2 diabetes mellitus without complications: Secondary | ICD-10-CM

## 2012-08-14 NOTE — Telephone Encounter (Signed)
La orders placed & patient notified. Lab slips mailed to patient.

## 2012-08-17 ENCOUNTER — Telehealth: Payer: Self-pay | Admitting: Family Medicine

## 2012-08-17 DIAGNOSIS — E119 Type 2 diabetes mellitus without complications: Secondary | ICD-10-CM | POA: Diagnosis not present

## 2012-08-17 DIAGNOSIS — E785 Hyperlipidemia, unspecified: Secondary | ICD-10-CM | POA: Diagnosis not present

## 2012-08-17 DIAGNOSIS — Z79899 Other long term (current) drug therapy: Secondary | ICD-10-CM | POA: Diagnosis not present

## 2012-08-17 LAB — HEMOGLOBIN A1C
Hgb A1c MFr Bld: 5.6 % (ref <5.7)
Mean Plasma Glucose: 114 mg/dL (ref <117)

## 2012-08-17 LAB — COMPREHENSIVE METABOLIC PANEL
CO2: 26 mEq/L (ref 19–32)
Creat: 1.07 mg/dL (ref 0.50–1.35)
Glucose, Bld: 109 mg/dL — ABNORMAL HIGH (ref 70–99)
Total Bilirubin: 1.3 mg/dL — ABNORMAL HIGH (ref 0.3–1.2)

## 2012-08-17 MED ORDER — BENAZEPRIL HCL 40 MG PO TABS: 40.0000 mg | ORAL_TABLET | Freq: Every day | ORAL | Status: DC

## 2012-08-17 MED ORDER — AMLODIPINE BESYLATE 10 MG PO TABS: 10.0000 mg | ORAL_TABLET | Freq: Every day | ORAL | Status: DC

## 2012-08-17 NOTE — Telephone Encounter (Signed)
Amlodipine Besylate 10 mg tab 1 QD #90 Benazepril HCL 40 mg tab 1 QD #90

## 2012-08-17 NOTE — Telephone Encounter (Signed)
Rx Refilled  

## 2012-08-18 ENCOUNTER — Ambulatory Visit (INDEPENDENT_AMBULATORY_CARE_PROVIDER_SITE_OTHER): Payer: Medicare Other | Admitting: Internal Medicine

## 2012-08-18 ENCOUNTER — Encounter: Payer: Self-pay | Admitting: Internal Medicine

## 2012-08-18 VITALS — BP 120/74 | HR 55 | Ht 68.5 in | Wt 164.3 lb

## 2012-08-18 DIAGNOSIS — I25810 Atherosclerosis of coronary artery bypass graft(s) without angina pectoris: Secondary | ICD-10-CM | POA: Diagnosis not present

## 2012-08-18 DIAGNOSIS — I10 Essential (primary) hypertension: Secondary | ICD-10-CM

## 2012-08-18 DIAGNOSIS — I079 Rheumatic tricuspid valve disease, unspecified: Secondary | ICD-10-CM

## 2012-08-18 DIAGNOSIS — E119 Type 2 diabetes mellitus without complications: Secondary | ICD-10-CM

## 2012-08-18 DIAGNOSIS — E785 Hyperlipidemia, unspecified: Secondary | ICD-10-CM

## 2012-08-18 DIAGNOSIS — I340 Nonrheumatic mitral (valve) insufficiency: Secondary | ICD-10-CM

## 2012-08-18 DIAGNOSIS — I071 Rheumatic tricuspid insufficiency: Secondary | ICD-10-CM

## 2012-08-18 DIAGNOSIS — I059 Rheumatic mitral valve disease, unspecified: Secondary | ICD-10-CM

## 2012-08-18 LAB — NMR LIPOPROFILE WITH LIPIDS
HDL Particle Number: 39.8 umol/L (ref >=30.5)
HDL Size: 9.2 nm (ref >=9.2)
LDL (calc): 58 mg/dL (ref <100)
LDL Size: 19.9 nm — ABNORMAL LOW (ref >20.5)
LP-IR Score: 39 (ref <=45)
Large HDL-P: 7.1 umol/L (ref >=4.8)
Small LDL Particle Number: 672 nmol/L — ABNORMAL HIGH (ref <=527)

## 2012-08-18 MED ORDER — CLONIDINE HCL 0.2 MG PO TABS: 0.2000 mg | ORAL_TABLET | Freq: Two times a day (BID) | ORAL | Status: DC

## 2012-08-18 NOTE — Progress Notes (Signed)
OFFICE NOTE  Chief Complaint:  Routine followup  Primary Care Physician: Leo Grosser, MD  HPI:  Donald Rios  is a pleasant 72 year old gentleman who has a history of CABG in 2000 with a LIMA to the LAD, free IMA to the OM2 and sequential graft to the PLA and PLD. He has done pretty well. He had a prior MI so his EF is about 45-50% with inferior wall motion abnormality. He has got very borderline diabetes and has lost some weight and is taking low-dose metformin but is really not bothered by that. His cholesterol profile actually is excellent. I did obtain recent laboratory work including a CMP that showed an A1c of 5.6, down from 6.0 in January. NMR lipid profile showed LDL particle number of 1035 which is near goal, a calculated LDLC of 79, HDL 44, triglycerides 57, overall a very favorable profile and shows excellent control. He had a repeat lipid profile yesterday, which is pending. He did reduce his dose of simvastatin to 40 mg daily and has noted a marked difference in muscle aches. Otherwise he denies any chest pain, worsening shortness of breath, palpitations, presyncope or syncopal symptoms.  PMHx:  Past Medical History  Diagnosis Date  . Asthma   . 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 Mild tricuspid regurgitation   . Mitral regurgitation   . Atrial enlargement, left   . Hearing loss   . Diverticulosis   . ED (erectile dysfunction)   . Hiatal hernia   . Gout   . Diabetes mellitus   . GERD (gastroesophageal reflux disease)   . Hyperlipidemia   . Hypertension   . History of MI (myocardial infarction)   . Ischemic cardiomyopathy     EF 45%, with inferior wall motion abnormality   . History of nuclear stress test 08/07/2010    dipyridamole; EKG negative for ischemia, low risk scan     Past Surgical History  Procedure Laterality Date  . Coronary artery bypass graft  2000    LIMA to LAD, free IMA to OM2, sequential graft to PLA & PLD  . Transthoracic  echocardiogram  09/07/2010    EF 45-50%; LV systolic function mildly reduced; LA mildly dilated; mild-mod MR & mild-mod TR; aortic root sclerosis/calcification;     FAMHx:  Family History  Problem Relation Age of Onset  . Stroke Father   . Stroke Maternal Grandmother   . Stroke Maternal Grandfather   . Hypertension Father   . Diabetes Brother     SOCHx:   reports that he quit smoking about 26 years ago. He has never used smokeless tobacco. He reports that  drinks alcohol. He reports that he uses illicit drugs.  ALLERGIES:  No Known Allergies  ROS: A comprehensive review of systems was negative except for: Integument/breast: positive for rash  HOME MEDS: Current Outpatient Prescriptions  Medication Sig Dispense Refill  . allopurinol (ZYLOPRIM) 300 MG tablet Take 300 mg by mouth daily.      Marland Kitchen amLODipine (NORVASC) 10 MG tablet Take 1 tablet (10 mg total) by mouth daily.  90 tablet  1  . aspirin 81 MG tablet Take 81 mg by mouth daily.        . benazepril (LOTENSIN) 40 MG tablet Take 1 tablet (40 mg total) by mouth daily.  90 tablet  1  . cloNIDine (CATAPRES) 0.2 MG tablet Take 0.2 mg by mouth 2 (two) times daily.      . metFORMIN (GLUCOPHAGE) 500 MG  tablet Take 1 tablet (500 mg total) by mouth daily.  30 tablet  5  . metoprolol (LOPRESSOR) 50 MG tablet Take 75 mg by mouth 2 (two) times daily.       . simvastatin (ZOCOR) 80 MG tablet Take 40 mg by mouth at bedtime. Having some muscular soreness and has decreased the dose to 40 mg at night.       No current facility-administered medications for this visit.    LABS/IMAGING: Results for orders placed in visit on 08/14/12 (from the past 48 hour(s))  HEMOGLOBIN A1C     Status: None   Collection Time    08/17/12  8:08 AM      Result Value Range   Hemoglobin A1C 5.6  <5.7 %   Comment:                                                                            According to the ADA Clinical Practice Recommendations for 2011, when      HbA1c is used as a screening test:             >=6.5%   Diagnostic of Diabetes Mellitus                (if abnormal result is confirmed)           5.7-6.4%   Increased risk of developing Diabetes Mellitus           References:Diagnosis and Classification of Diabetes Mellitus,Diabetes     Care,2011,34(Suppl 1):S62-S69 and Standards of Medical Care in             Diabetes - 2011,Diabetes Care,2011,34 (Suppl 1):S11-S61.         Mean Plasma Glucose 114  <117 mg/dL  COMPREHENSIVE METABOLIC PANEL     Status: Abnormal   Collection Time    08/17/12  8:10 AM      Result Value Range   Sodium 141  135 - 145 mEq/L   Potassium 4.9  3.5 - 5.3 mEq/L   Chloride 109  96 - 112 mEq/L   CO2 26  19 - 32 mEq/L   Glucose, Bld 109 (*) 70 - 99 mg/dL   BUN 20  6 - 23 mg/dL   Creat 1.61  0.96 - 0.45 mg/dL   Total Bilirubin 1.3 (*) 0.3 - 1.2 mg/dL   Alkaline Phosphatase 63  39 - 117 U/L   AST 16  0 - 37 U/L   ALT 13  0 - 53 U/L   Total Protein 6.4  6.0 - 8.3 g/dL   Albumin 4.2  3.5 - 5.2 g/dL   Calcium 9.5  8.4 - 40.9 mg/dL   No results found.  VITALS: BP 120/74  Pulse 55  Ht 5' 8.5" (1.74 m)  Wt 164 lb 4.8 oz (74.526 kg)  BMI 24.62 kg/m2  EXAM: General appearance: alert and no distress Neck: no adenopathy, no carotid bruit, no JVD, supple, symmetrical, trachea midline and thyroid not enlarged, symmetric, no tenderness/mass/nodules Lungs: clear to auscultation bilaterally Heart: regular rate and rhythm, S1, S2 normal, no murmur, click, rub or gallop Abdomen: soft, non-tender; bowel sounds normal; no masses,  no organomegaly Extremities: extremities normal, atraumatic, no  cyanosis or edema Pulses: 2+ and symmetric Skin: Skin color, texture, turgor normal. No rashes or lesions Neurologic: Grossly normal  EKG: Sinus bradycardia at 55, with repolarization changes previously seen inferiorly and laterally with T wave inversions  ASSESSMENT: 1. Coronary disease status post three-vessel CABG in  2000 2. History of inferior MI 3. Ischemic cardiomyopathy EF 45% 4. Hypertension 5. Dyslipidemia 6. Diabetes type 2 with a recent A1c of 5.6  PLAN: 1.   Donald Rios is doing very well and continues to exercise several times a week. He recently had blood work which reveals a much improved A1c. He is also changing his dietary habits which could result in lower cluster all. He did however decrease the dose of simvastatin to 40 mg due to complaints about muscle aches which he says have remarkably improved. He was previously on 40 mg daily. However, at the 80 mg doses particle number was close to 1000. A repeat lipid profile is pending.  We'll plan to see him back in 6 months or sooner if necessary.  Chrystie Nose, MD, [facility] Attending Cardiologist [facility]  HILTY,Kenneth C 08/18/2012, 8:30 AM

## 2012-08-18 NOTE — Telephone Encounter (Signed)
[employer]

## 2012-08-18 NOTE — Patient Instructions (Addendum)
Your physician wants you to follow-up in:  6 months. You will receive a reminder letter in the mail two months in advance. If you don't receive a letter, please call our office to schedule the follow-up appointment.   

## 2012-08-18 NOTE — Telephone Encounter (Signed)
?   OK to Refill  

## 2012-08-18 NOTE — Telephone Encounter (Signed)
ok 

## 2012-08-21 ENCOUNTER — Telehealth: Payer: Self-pay | Admitting: Family Medicine

## 2012-08-21 NOTE — Telephone Encounter (Signed)
Metoprolol Tartrate 50 mg tab 1 1/2 BID #270

## 2012-08-22 MED ORDER — METOPROLOL TARTRATE 50 MG PO TABS: 75.0000 mg | ORAL_TABLET | Freq: Two times a day (BID) | ORAL | Status: DC

## 2012-08-22 NOTE — Telephone Encounter (Signed)
Rx Refilled  

## 2012-10-23 ENCOUNTER — Encounter: Payer: Self-pay | Admitting: Internal Medicine

## 2012-10-24 ENCOUNTER — Telehealth: Payer: Self-pay | Admitting: Family Medicine

## 2012-10-24 MED ORDER — ALLOPURINOL 300 MG PO TABS: 300.0000 mg | ORAL_TABLET | Freq: Every day | ORAL | Status: DC

## 2012-10-24 NOTE — Telephone Encounter (Signed)
Allopurinol 300mg   QD

## 2012-11-02 ENCOUNTER — Ambulatory Visit (INDEPENDENT_AMBULATORY_CARE_PROVIDER_SITE_OTHER): Payer: Medicare Other | Admitting: Family Medicine

## 2012-11-02 ENCOUNTER — Encounter: Payer: Self-pay | Admitting: Family Medicine

## 2012-11-02 VITALS — BP 112/60 | HR 78 | Temp 97.1°F | Resp 18 | Wt 170.0 lb

## 2012-11-02 DIAGNOSIS — L538 Other specified erythematous conditions: Secondary | ICD-10-CM

## 2012-11-02 DIAGNOSIS — Z125 Encounter for screening for malignant neoplasm of prostate: Secondary | ICD-10-CM | POA: Diagnosis not present

## 2012-11-02 DIAGNOSIS — E119 Type 2 diabetes mellitus without complications: Secondary | ICD-10-CM | POA: Diagnosis not present

## 2012-11-02 DIAGNOSIS — E785 Hyperlipidemia, unspecified: Secondary | ICD-10-CM

## 2012-11-02 DIAGNOSIS — Z23 Encounter for immunization: Secondary | ICD-10-CM

## 2012-11-02 DIAGNOSIS — L304 Erythema intertrigo: Secondary | ICD-10-CM

## 2012-11-02 MED ORDER — CLOTRIMAZOLE-BETAMETHASONE 1-0.05 % EX CREA: TOPICAL_CREAM | Freq: Two times a day (BID) | CUTANEOUS | Status: DC

## 2012-11-02 NOTE — Progress Notes (Signed)
Subjective:    Patient ID: Donald Rios, male    DOB: 07-Apr-1940, 72 y.o.   MRN: [id number]  HPI This is a very pleasant 72 year old Caucasian man with history of coronary artery disease status post bypass graft, hypertension, hyperlipidemia, and diet-controlled diabetes mellitus type 2. He presents today for followup of his medical conditions. I reviewed his lab work from August when he saw his cardiologist. His hemoglobin A1c was excellent at 5.6. His LDL C. was 58 particle number less than 1000. Otherwise his labs were normal. He denies any chest pain, shortness of breath, dyspnea on exertion. He denies any polyuria, polydipsia, or blurred vision. He denies any neuropathy in the feet. He is taking aspirin on a daily basis. He is sitting on exam. He denies any myalgia or right quadrant pain. He was having substantial myalgia but she was unsure whether it was due to the statin or his PMR. He reduced his Zocor to 40 mg per day. On 40 mg a day his myalgia improved dramatically. Furthermore his cholesterol is still excellent at the lower dose. Past Medical History  Diagnosis Date  . Asthma   . 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 Mild tricuspid regurgitation   . Mitral regurgitation   . Atrial enlargement, left   . Hearing loss   . Diverticulosis   . ED (erectile dysfunction)   . Hiatal hernia   . Gout   . Diabetes mellitus   . GERD (gastroesophageal reflux disease)   . Hyperlipidemia   . Hypertension   . History of MI (myocardial infarction)   . Ischemic cardiomyopathy     EF 45%, with inferior wall motion abnormality   . History of nuclear stress test 08/07/2010    dipyridamole; EKG negative for ischemia, low risk scan    Past Surgical History  Procedure Laterality Date  . Coronary artery bypass graft  2000    LIMA to LAD, free IMA to OM2, sequential graft to PLA & PLD  . Transthoracic echocardiogram  09/07/2010    EF 45-50%; LV systolic function mildly reduced; LA  mildly dilated; mild-mod MR & mild-mod TR; aortic root sclerosis/calcification;    Current Outpatient Prescriptions on File Prior to Visit  Medication Sig Dispense Refill  . allopurinol (ZYLOPRIM) 300 MG tablet Take 1 tablet (300 mg total) by mouth daily.  30 tablet  2  . amLODipine (NORVASC) 10 MG tablet Take 1 tablet (10 mg total) by mouth daily.  90 tablet  1  . aspirin 81 MG tablet Take 81 mg by mouth daily.        . benazepril (LOTENSIN) 40 MG tablet Take 1 tablet (40 mg total) by mouth daily.  90 tablet  1  . cloNIDine (CATAPRES) 0.2 MG tablet Take 1 tablet (0.2 mg total) by mouth 2 (two) times daily.  180 tablet  1  . metFORMIN (GLUCOPHAGE) 500 MG tablet Take 1 tablet (500 mg total) by mouth daily.  30 tablet  5  . metoprolol (LOPRESSOR) 50 MG tablet Take 1.5 tablets (75 mg total) by mouth 2 (two) times daily.  270 tablet  1  . simvastatin (ZOCOR) 80 MG tablet Take 40 mg by mouth at bedtime. Having some muscular soreness and has decreased the dose to 40 mg at night.       No current facility-administered medications on file prior to visit.   No Known Allergies History   Social History  . Marital Status: Married    Spouse  Name: N/A    Number of Children: N/A  . Years of Education: N/A   Occupational History  . Not on file.   Social History Main Topics  . Smoking status: Former Smoker    Quit date: 11/04/1985  . Smokeless tobacco: Never Used  . Alcohol Use: Yes  . Drug Use: Yes  . Sexual Activity: Not on file   Other Topics Concern  . Not on file   Social History Narrative  . No narrative on file      Review of Systems  All other systems reviewed and are negative.       Objective:   Physical Exam  Vitals reviewed. Constitutional: He appears well-developed and well-nourished. No distress.  Eyes: Conjunctivae are normal. Pupils are equal, round, and reactive to light. No scleral icterus.  Neck: Neck supple. No JVD present. No thyromegaly present.   Cardiovascular: Normal rate, regular rhythm, normal heart sounds and intact distal pulses.  Exam reveals no gallop and no friction rub.   No murmur heard. Pulmonary/Chest: Effort normal and breath sounds normal. No respiratory distress. He has no wheezes. He has no rales. He exhibits no tenderness.  Abdominal: Soft. Bowel sounds are normal. He exhibits no distension and no mass. There is no tenderness. There is no rebound and no guarding.  Lymphadenopathy:    He has no cervical adenopathy.  Skin: He is not diaphoretic.   patient does have an intertriginous rash between his gluteal folds consistent of an erythematous patch with white scale.         Assessment & Plan:  1. Need for prophylactic vaccination and inoculation against influenza - Flu Vaccine QUAD 36+ mos PF IM (Fluarix)  2. Intertrigo - clotrimazole-betamethasone (LOTRISONE) cream; Apply topically 2 (two) times daily.  Dispense: 45 g; Refill: 0  3. Prostate cancer screening - PSA, Medicare  4. Type II or unspecified type diabetes mellitus without mention of complication, not stated as uncontrolled most recent hemoglobin A1c was excellent. Recheck hemoglobin A1c in 6 months. His diabetes seems well controlled.   5. HLD (hyperlipidemia) His LDL is less than his goal of 70. The cholesterol seems well controlled. Recheck fasting lipid panel in 6 months.

## 2012-11-23 DIAGNOSIS — T1500XA Foreign body in cornea, unspecified eye, initial encounter: Secondary | ICD-10-CM | POA: Diagnosis not present

## 2012-11-28 ENCOUNTER — Telehealth: Payer: Self-pay | Admitting: *Deleted

## 2012-11-28 MED ORDER — METFORMIN HCL 500 MG PO TABS: 500.0000 mg | ORAL_TABLET | Freq: Every day | ORAL | Status: DC

## 2012-11-28 NOTE — Telephone Encounter (Signed)
Metformin 500mg  take one tablet by mouth daily #60  Med refilled

## 2012-11-29 DIAGNOSIS — E119 Type 2 diabetes mellitus without complications: Secondary | ICD-10-CM | POA: Diagnosis not present

## 2012-11-29 DIAGNOSIS — T1500XA Foreign body in cornea, unspecified eye, initial encounter: Secondary | ICD-10-CM | POA: Diagnosis not present

## 2012-11-30 ENCOUNTER — Telehealth: Payer: Self-pay | Admitting: Family Medicine

## 2012-11-30 NOTE — Telephone Encounter (Signed)
Donald Rios called today stating that Dr Tanya Nones gave him a cream for his yeast infection and it is not helping it seems to be aggravating it more causing a rash. Rett best call back number is [phone]

## 2012-12-01 MED ORDER — MOMETASONE FUROATE 0.1 % EX OINT: TOPICAL_OINTMENT | Freq: Every day | CUTANEOUS | Status: DC

## 2012-12-01 NOTE — Telephone Encounter (Signed)
Patient aware.

## 2012-12-01 NOTE — Telephone Encounter (Signed)
I thought it was intertrigo.  Try elocon ointment qd for 1 week and if no better NTBS.

## 2012-12-15 ENCOUNTER — Encounter: Payer: Self-pay | Admitting: Family Medicine

## 2012-12-15 ENCOUNTER — Ambulatory Visit (INDEPENDENT_AMBULATORY_CARE_PROVIDER_SITE_OTHER): Payer: Medicare Other | Admitting: Family Medicine

## 2012-12-15 VITALS — BP 110/64 | HR 76 | Temp 97.6°F | Resp 16 | Ht 68.5 in | Wt 168.0 lb

## 2012-12-15 DIAGNOSIS — L98499 Non-pressure chronic ulcer of skin of other sites with unspecified severity: Secondary | ICD-10-CM | POA: Diagnosis not present

## 2012-12-15 DIAGNOSIS — D485 Neoplasm of uncertain behavior of skin: Secondary | ICD-10-CM | POA: Diagnosis not present

## 2012-12-15 NOTE — Progress Notes (Signed)
Subjective:    Patient ID: Donald Rios, male    DOB: Nov 24, 1940, 72 y.o.   MRN: [id number]  HPI  At the patient's last visit, he showed me an intertriginous rash between the gluteal clefts near the rectum. I believe the patient to have intertrigo and prescribe Lotrisone cream to be applied twice a day for 2 weeks. The patient called back after 6 weeks with no improvement. I then called out the patient Elocon ointment to be applied daily for one week and the patient experienced no improvement in his symptoms. He has tenderness and pain in that area. He is here today for reevaluation.  He continues to have throughout on both gluteal folds near the rectum. On the right gluteal fold there are two, 4 mm ulcerations within the purpura. There is surrounding erythema consistent with possible intertrigo. Past Medical History  Diagnosis Date  . Asthma   . 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 Mild tricuspid regurgitation   . Mitral regurgitation   . Atrial enlargement, left   . Hearing loss   . Diverticulosis   . ED (erectile dysfunction)   . Hiatal hernia   . Gout   . Diabetes mellitus   . GERD (gastroesophageal reflux disease)   . Hyperlipidemia   . Hypertension   . History of MI (myocardial infarction)   . Ischemic cardiomyopathy     EF 45%, with inferior wall motion abnormality   . History of nuclear stress test 08/07/2010    dipyridamole; EKG negative for ischemia, low risk scan    Current Outpatient Prescriptions on File Prior to Visit  Medication Sig Dispense Refill  . allopurinol (ZYLOPRIM) 300 MG tablet Take 1 tablet (300 mg total) by mouth daily.  30 tablet  2  . amLODipine (NORVASC) 10 MG tablet Take 1 tablet (10 mg total) by mouth daily.  90 tablet  1  . aspirin 81 MG tablet Take 81 mg by mouth daily.        . benazepril (LOTENSIN) 40 MG tablet Take 1 tablet (40 mg total) by mouth daily.  90 tablet  1  . cloNIDine (CATAPRES) 0.2 MG tablet Take 1 tablet (0.2 mg  total) by mouth 2 (two) times daily.  180 tablet  1  . metFORMIN (GLUCOPHAGE) 500 MG tablet Take 1 tablet (500 mg total) by mouth daily.  30 tablet  1  . metoprolol (LOPRESSOR) 50 MG tablet Take 1.5 tablets (75 mg total) by mouth 2 (two) times daily.  270 tablet  1  . simvastatin (ZOCOR) 80 MG tablet Take 40 mg by mouth at bedtime. Having some muscular soreness and has decreased the dose to 40 mg at night.      . clotrimazole-betamethasone (LOTRISONE) cream Apply topically 2 (two) times daily.  45 g  0  . mometasone (ELOCON) 0.1 % ointment Apply topically daily. X 1 week  45 g  0   No current facility-administered medications on file prior to visit.   No Known Allergies History   Social History  . Marital Status: Married    Spouse Name: N/A    Number of Children: N/A  . Years of Education: N/A   Occupational History  . Not on file.   Social History Main Topics  . Smoking status: Former Smoker    Quit date: 11/04/1985  . Smokeless tobacco: Never Used  . Alcohol Use: Yes  . Drug Use: Yes  . Sexual Activity: Not on file  Other Topics Concern  . Not on file   Social History Narrative  . No narrative on file     Review of Systems  All other systems reviewed and are negative.       Objective:   Physical Exam  Vitals reviewed. Cardiovascular: Normal rate and regular rhythm.   Pulmonary/Chest: Effort normal and breath sounds normal.  Skin: Rash noted. There is erythema.   please see the description in the history of present illness. They are to 4 cm x 3 cm purpura on each side of the gluteal cleft. On the right gluteal cleft there are 2 small superficial ulcerations approximate 4 mm in diameter.        Assessment & Plan:  Neoplasm of uncertain behavior of skin - Plan: Pathology  Believe the patient has intertrigo but he is still conventional therapy. Therefore I performed a shave biopsy of one of the ulcerated lesions to rule out malignancy. The area was  anesthetized with 0.1% lidocaine and a shave biopsy was sent to pathology. Hemostasis was achieved with Drysol. The wound was then dressed. Wound care was discussed. Further treatment will depend on the biopsy results.

## 2012-12-19 LAB — PATHOLOGY

## 2013-01-20 ENCOUNTER — Other Ambulatory Visit: Payer: Self-pay | Admitting: Family Medicine

## 2013-01-25 ENCOUNTER — Other Ambulatory Visit: Payer: Self-pay | Admitting: Family Medicine

## 2013-01-25 NOTE — Telephone Encounter (Signed)
Medication refilled per protocol. 

## 2013-01-26 ENCOUNTER — Encounter: Payer: Self-pay | Admitting: Family Medicine

## 2013-01-26 ENCOUNTER — Ambulatory Visit (INDEPENDENT_AMBULATORY_CARE_PROVIDER_SITE_OTHER): Payer: Medicare Other | Admitting: Family Medicine

## 2013-01-26 VITALS — BP 110/68 | HR 80 | Temp 97.8°F | Resp 16 | Ht 68.5 in | Wt 169.0 lb

## 2013-01-26 DIAGNOSIS — D485 Neoplasm of uncertain behavior of skin: Secondary | ICD-10-CM

## 2013-01-26 DIAGNOSIS — Z23 Encounter for immunization: Secondary | ICD-10-CM

## 2013-01-26 DIAGNOSIS — L304 Erythema intertrigo: Secondary | ICD-10-CM

## 2013-01-26 DIAGNOSIS — L538 Other specified erythematous conditions: Secondary | ICD-10-CM | POA: Diagnosis not present

## 2013-01-26 NOTE — Progress Notes (Signed)
Subjective:    Patient ID: Donald Rios, male    DOB: 1940-07-17, 73 y.o.   MRN: [id number]  HPI  12/15/12 At the patient's last visit, he showed me an intertriginous rash between the gluteal clefts near the rectum. I believe the patient to have intertrigo and prescribe Lotrisone cream to be applied twice a day for 2 weeks. The patient called back after 6 weeks with no improvement. I then called out the patient Elocon ointment to be applied daily for one week and the patient experienced no improvement in his symptoms. He has tenderness and pain in that area. He is here today for reevaluation.  He continues to have throughout on both gluteal folds near the rectum. On the right gluteal fold there are two, 4 mm ulcerations within the purpura. There is surrounding erythema consistent with possible intertrigo.  At that time, my plan was: Believe the patient has intertrigo but he has failed conventional therapy. Therefore I performed a shave biopsy of one of the ulcerated lesions to rule out malignancy. The area was anesthetized with 0.1% lidocaine and a shave biopsy was sent to pathology. Hemostasis was achieved with Drysol. The wound was then dressed. Wound care was discussed. Further treatment will depend on the biopsy results.  The biopsy revealed: No visits with results within 1 Month(s) from this visit. Latest known visit with results is:  Office Visit on 12/15/2012  Component Date Value Range Status  . Report 12/15/2012    Final   Comment: FINAL DIAGNOSIS:                          A. Skin-Biopsy, right gluteal cleft:                                 Benign skin with focal ulceration and granulation tissue.                               Negative for fungal hyphae.                                Negative for atypia or malignancy.                                See comment.                            COMMENT:                          Original and recut sections were evaluated. A PAS stain  with good positive control was                          performed and is negative for fungal hyphae.                           Coralee North, MD, [place]                          Electronically Signed  [address] [place], [place] [address]                          CLINICAL HISTORY:                          238.2                          SOURCE OF SPECIMEN:                          A: Skin-Biopsy, right gluteal cleft                          GROSS DESCRIPTION:                          A. Received in a single container of formalin labeled with two patient identifiers,                          patient name and date of birth, as well as "right gluteal cleft" is a shave biopsy                          measuring 1.0 x 0.9 x 0.1 cm displaying a well circumscribed, flat and light tan lesion                          measuring 0.5 x 0.5 cm.  The surgical margin is inked blue and the specimen is                          quadrisected and submitted entirely in cassette A.  [place]                          Grossing performed at [facility], [address], [place], [place]. CLIA# [id number], Medical Director, Jackolyn Confer, M.D., Ph.D., Phone [phone]. This report contains confidential medical information. If you have received this                          report, in error, please contact [phone]   at that point I started the patient on DuoDERM dressings, and instructed him to change the dressing every 5 days.  The patient did this for more than 2 weeks. He did notice some improvement in the rash and irritation in his gluteal cleft but the lesion did not totally resolve. He is here today for reevaluation. In my estimation the lesions look worse they're now proximally 6 cm x 3 cm. They're essentially purpura with chronic irritation. I see no scale or sign of infection.  Past  Medical History  Diagnosis Date  . Asthma   . 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 Mild tricuspid regurgitation   .  Mitral regurgitation   . Atrial enlargement, left   . Hearing loss   . Diverticulosis   . ED (erectile dysfunction)   . Hiatal hernia   . Gout   . Diabetes mellitus   . GERD (gastroesophageal reflux disease)   . Hyperlipidemia   . Hypertension   . History of MI (myocardial infarction)   . Ischemic cardiomyopathy     EF 45%, with inferior wall motion abnormality   . History of nuclear stress test 08/07/2010    dipyridamole; EKG negative for ischemia, low risk scan    Current Outpatient Prescriptions on File Prior to Visit  Medication Sig Dispense Refill  . allopurinol (ZYLOPRIM) 300 MG tablet TAKE 1 TABLET (300 MG TOTAL) BY MOUTH DAILY.  90 tablet  1  . amLODipine (NORVASC) 10 MG tablet Take 1 tablet (10 mg total) by mouth daily.  90 tablet  1  . aspirin 81 MG tablet Take 81 mg by mouth daily.        . benazepril (LOTENSIN) 40 MG tablet Take 1 tablet (40 mg total) by mouth daily.  90 tablet  1  . cloNIDine (CATAPRES) 0.2 MG tablet Take 1 tablet (0.2 mg total) by mouth 2 (two) times daily.  180 tablet  1  . metFORMIN (GLUCOPHAGE) 500 MG tablet TAKE 1 TABLET (500 MG TOTAL) BY MOUTH DAILY.  60 tablet  3  . metoprolol (LOPRESSOR) 50 MG tablet Take 1.5 tablets (75 mg total) by mouth 2 (two) times daily.  270 tablet  1  . simvastatin (ZOCOR) 80 MG tablet Take 40 mg by mouth at bedtime. Having some muscular soreness and has decreased the dose to 40 mg at night.      . clotrimazole-betamethasone (LOTRISONE) cream Apply topically 2 (two) times daily.  45 g  0  . mometasone (ELOCON) 0.1 % ointment Apply topically daily. X 1 week  45 g  0   No current facility-administered medications on file prior to visit.   No Known Allergies History   Social History  . Marital Status: Married    Spouse Name: N/A    Number of Children: N/A  . Years of Education: N/A    Occupational History  . Not on file.   Social History Main Topics  . Smoking status: Former Smoker    Quit date: 11/04/1985  . Smokeless tobacco: Never Used  . Alcohol Use: Yes  . Drug Use: Yes  . Sexual Activity: Not on file   Other Topics Concern  . Not on file   Social History Narrative  . No narrative on file     Review of Systems  All other systems reviewed and are negative.       Objective:   Physical Exam  Vitals reviewed. Cardiovascular: Normal rate and regular rhythm.   Pulmonary/Chest: Effort normal and breath sounds normal.  Skin: Rash noted. There is erythema.   please see the description in the history of present illness. They are to 6cm x 3 cm purpura on each side of the gluteal cleft.        Assessment & Plan:  1. Need for prophylactic vaccination against Streptococcus pneumoniae (pneumococcus) - Pneumococcal conjugate vaccine 13-valent IM  2. Intertrigo - Ambulatory referral to Dermatology  3. Neoplasm of uncertain behavior of skin - Ambulatory referral to Dermatology  Clinically I believe the patient developed intertrigo and now has chronic irritation and inflammation due to moisture and pressure. However I am not sure as the patient has failed  conventional therapy including topical antifungals, topical corticosteroids, and occlusive dressings.  I recommended he continue DuoDERM, changing the dressing every 5 days.  However I am also going to consult dermatology for second opinion.  Patient is amenable to this plan.

## 2013-02-01 ENCOUNTER — Telehealth: Payer: Self-pay | Admitting: Internal Medicine

## 2013-02-01 NOTE — Telephone Encounter (Signed)
Need a lab order sent before his appt on 02/15/13    Thanks

## 2013-02-01 NOTE — Telephone Encounter (Signed)
No labs were ordered at last OV in August 2014. Called patient and left message with this info with his wife.   Also noted: Dr. Dennard Schaumann ordered labs to be done sometime in March(?) per OV in EPIC (lipid included)

## 2013-02-01 NOTE — Telephone Encounter (Signed)
No labs ordered at last OV.  Message forwarded to J. Arelia Sneddon, RN to review and order if appropriate.

## 2013-02-06 ENCOUNTER — Encounter: Payer: Self-pay | Admitting: Family Medicine

## 2013-02-06 ENCOUNTER — Other Ambulatory Visit: Payer: Self-pay | Admitting: Family Medicine

## 2013-02-06 MED ORDER — AMOXICILLIN 875 MG PO TABS: 875.0000 mg | ORAL_TABLET | Freq: Two times a day (BID) | ORAL | Status: DC

## 2013-02-14 ENCOUNTER — Ambulatory Visit (INDEPENDENT_AMBULATORY_CARE_PROVIDER_SITE_OTHER): Payer: Medicare Other | Admitting: Internal Medicine

## 2013-02-14 ENCOUNTER — Encounter (HOSPITAL_COMMUNITY): Payer: Self-pay | Admitting: *Deleted

## 2013-02-14 ENCOUNTER — Encounter: Payer: Self-pay | Admitting: Internal Medicine

## 2013-02-14 VITALS — BP 128/66 | HR 57 | Ht 68.5 in | Wt 164.1 lb

## 2013-02-14 DIAGNOSIS — I25810 Atherosclerosis of coronary artery bypass graft(s) without angina pectoris: Secondary | ICD-10-CM | POA: Diagnosis not present

## 2013-02-14 DIAGNOSIS — Z79899 Other long term (current) drug therapy: Secondary | ICD-10-CM

## 2013-02-14 DIAGNOSIS — E785 Hyperlipidemia, unspecified: Secondary | ICD-10-CM

## 2013-02-14 DIAGNOSIS — Z951 Presence of aortocoronary bypass graft: Secondary | ICD-10-CM

## 2013-02-14 DIAGNOSIS — I252 Old myocardial infarction: Secondary | ICD-10-CM | POA: Diagnosis not present

## 2013-02-14 DIAGNOSIS — E119 Type 2 diabetes mellitus without complications: Secondary | ICD-10-CM

## 2013-02-14 DIAGNOSIS — I2510 Atherosclerotic heart disease of native coronary artery without angina pectoris: Secondary | ICD-10-CM

## 2013-02-14 DIAGNOSIS — I10 Essential (primary) hypertension: Secondary | ICD-10-CM

## 2013-02-14 DIAGNOSIS — R9431 Abnormal electrocardiogram [ECG] [EKG]: Secondary | ICD-10-CM

## 2013-02-14 NOTE — Progress Notes (Signed)
OFFICE NOTE  Chief Complaint:  Routine followup  Primary Care Physician: Odette Fraction, MD  HPI:  Donald Rios  is a pleasant 73 year old gentleman who has a history of CABG in 2000 with a LIMA to the LAD, free IMA to the OM2 and sequential graft to the PLA and PLD. He has done pretty well. He had a prior MI so his EF is about 45-50% with inferior wall motion abnormality. He has got very borderline diabetes and has lost some weight and is taking low-dose metformin but is really not bothered by that. His cholesterol profile actually is excellent. I did obtain recent laboratory work including a CMP that showed an A1c of 5.6, down from 6.0 in January. NMR lipid profile showed LDL particle number of 1035 which is near goal, a calculated LDLC of 79, HDL 44, triglycerides 57, overall a very favorable profile and shows excellent control. He did reduce his dose of simvastatin to 40 mg daily and has noted a marked difference in muscle aches. Otherwise he denies any chest pain, worsening shortness of breath, palpitations, presyncope or syncopal symptoms.  PMHx:  Past Medical History  Diagnosis Date  . Asthma   . 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 Mild tricuspid regurgitation   . Mitral regurgitation   . Atrial enlargement, left   . Hearing loss   . Diverticulosis   . ED (erectile dysfunction)   . Hiatal hernia   . Gout   . Diabetes mellitus   . GERD (gastroesophageal reflux disease)   . Hyperlipidemia   . Hypertension   . History of MI (myocardial infarction)   . Ischemic cardiomyopathy     EF 45%, with inferior wall motion abnormality   . History of nuclear stress test 08/07/2010    dipyridamole; EKG negative for ischemia, low risk scan     Past Surgical History  Procedure Laterality Date  . Coronary artery bypass graft  2000    LIMA to LAD, free IMA to OM2, sequential graft to PLA & PLD  . Transthoracic echocardiogram  09/07/2010    EF 02-54%; LV systolic  function mildly reduced; LA mildly dilated; mild-mod MR & mild-mod TR; aortic root sclerosis/calcification;     FAMHx:  Family History  Problem Relation Age of Onset  . Stroke Father   . Stroke Maternal Grandmother   . Stroke Maternal Grandfather   . Hypertension Father   . Diabetes Brother     SOCHx:   reports that he quit smoking about 27 years ago. He has never used smokeless tobacco. He reports that he drinks alcohol. He reports that he uses illicit drugs.  ALLERGIES:  No Known Allergies  ROS: A comprehensive review of systems was negative except for: Ears, nose, mouth, throat, and face: positive for nasal congestion  HOME MEDS: Current Outpatient Prescriptions  Medication Sig Dispense Refill  . allopurinol (ZYLOPRIM) 300 MG tablet TAKE 1 TABLET (300 MG TOTAL) BY MOUTH DAILY.  90 tablet  1  . amLODipine (NORVASC) 10 MG tablet Take 1 tablet (10 mg total) by mouth daily.  90 tablet  1  . amoxicillin (AMOXIL) 875 MG tablet Take 1 tablet (875 mg total) by mouth 2 (two) times daily.  20 tablet  0  . aspirin 81 MG tablet Take 81 mg by mouth daily.        . benazepril (LOTENSIN) 40 MG tablet Take 1 tablet (40 mg total) by mouth daily.  90 tablet  1  .  cloNIDine (CATAPRES) 0.2 MG tablet Take 1 tablet (0.2 mg total) by mouth 2 (two) times daily.  180 tablet  1  . metFORMIN (GLUCOPHAGE) 500 MG tablet TAKE 1 TABLET (500 MG TOTAL) BY MOUTH DAILY.  60 tablet  3  . metoprolol (LOPRESSOR) 50 MG tablet Take 1.5 tablets (75 mg total) by mouth 2 (two) times daily.  270 tablet  1  . simvastatin (ZOCOR) 80 MG tablet Take 40 mg by mouth at bedtime. Having some muscular soreness and has decreased the dose to 40 mg at night.       No current facility-administered medications for this visit.    LABS/IMAGING: No results found for this or any previous visit (from the past 48 hour(s)). No results found.  VITALS: BP 128/66  Pulse 57  Ht 5' 8.5" (1.74 m)  Wt 164 lb 1.6 oz (74.435 kg)  BMI 24.59  kg/m2  EXAM: General appearance: alert and no distress Neck: no adenopathy, no carotid bruit, no JVD, supple, symmetrical, trachea midline and thyroid not enlarged, symmetric, no tenderness/mass/nodules Lungs: clear to auscultation bilaterally Heart: regular rate and rhythm, S1, S2 normal, no murmur, click, rub or gallop Abdomen: soft, non-tender; bowel sounds normal; no masses,  no organomegaly Extremities: extremities normal, atraumatic, no cyanosis or edema Pulses: 2+ and symmetric Skin: Skin color, texture, turgor normal. No rashes or lesions Neurologic: Grossly normal  EKG: Sinus bradycardia at 57, more prounounced repolarization changes inferiorly and laterally with T wave inversions, ?ischemia  ASSESSMENT: 1. Coronary disease status post three-vessel CABG in 2000 2. History of inferior MI 3. Ischemic cardiomyopathy EF 45% 4. Hypertension 5. Dyslipidemia 6. Diabetes type 2 with a recent A1c of 5.6 7. Abnormal EKG with inferior and lateral TWI's, ST depression  PLAN: 1.   [name] is doing very well and continues to exercise several times a week. He denies any chest pain with exertion, his EKG does show worsening EKG changes in the inferior and lateral leads with T-wave inversions concerning for possible ischemia. His last stress test was in 2012 and his bypass grafts are now 72 years old. I would like to repeat a nuclear stress test to reevaluate for possible bypass graft obstruction.  In addition he is due for repeat lipid profile and CMP. I will adjust his cholesterol medication accordingly contacted with the results of the stress test when they're available.  Pixie Casino, MD, [facility] Attending Cardiologist [facility] 02/14/2013, 12:51 PM

## 2013-02-14 NOTE — Patient Instructions (Signed)
Your physician has requested that you have a lexiscan myoview. For further information please visit [email or web address]. Please follow instruction sheet, as given.  Your physician recommends that you return for lab work at your earliest convenience.   Your physician wants you to follow-up in: 6 months. You will receive a reminder letter in the mail two months in advance. If you don't receive a letter, please call our office to schedule the follow-up appointment. Please have fasting blood work prior to this visit.

## 2013-02-17 ENCOUNTER — Other Ambulatory Visit: Payer: Self-pay | Admitting: Family Medicine

## 2013-02-19 ENCOUNTER — Other Ambulatory Visit: Payer: Self-pay | Admitting: Family Medicine

## 2013-02-19 MED ORDER — CLONIDINE HCL 0.2 MG PO TABS: 0.2000 mg | ORAL_TABLET | Freq: Two times a day (BID) | ORAL | Status: DC

## 2013-02-19 NOTE — Telephone Encounter (Signed)
Rx Refilled  

## 2013-02-21 DIAGNOSIS — L259 Unspecified contact dermatitis, unspecified cause: Secondary | ICD-10-CM | POA: Diagnosis not present

## 2013-02-22 ENCOUNTER — Ambulatory Visit (HOSPITAL_COMMUNITY)
Admission: RE | Admit: 2013-02-22 | Discharge: 2013-02-22 | Disposition: A | Discharge status: Home / Self Care | Payer: Medicare Other | Source: Ambulatory Visit | Attending: Internal Medicine | Admitting: Internal Medicine

## 2013-02-22 DIAGNOSIS — I059 Rheumatic mitral valve disease, unspecified: Secondary | ICD-10-CM | POA: Diagnosis not present

## 2013-02-22 DIAGNOSIS — I252 Old myocardial infarction: Secondary | ICD-10-CM

## 2013-02-22 DIAGNOSIS — Z8249 Family history of ischemic heart disease and other diseases of the circulatory system: Secondary | ICD-10-CM | POA: Insufficient documentation

## 2013-02-22 DIAGNOSIS — Z87891 Personal history of nicotine dependence: Secondary | ICD-10-CM | POA: Insufficient documentation

## 2013-02-22 DIAGNOSIS — E119 Type 2 diabetes mellitus without complications: Secondary | ICD-10-CM | POA: Insufficient documentation

## 2013-02-22 DIAGNOSIS — Z951 Presence of aortocoronary bypass graft: Secondary | ICD-10-CM | POA: Insufficient documentation

## 2013-02-22 DIAGNOSIS — I2510 Atherosclerotic heart disease of native coronary artery without angina pectoris: Secondary | ICD-10-CM | POA: Insufficient documentation

## 2013-02-22 DIAGNOSIS — E785 Hyperlipidemia, unspecified: Secondary | ICD-10-CM | POA: Insufficient documentation

## 2013-02-22 DIAGNOSIS — R9431 Abnormal electrocardiogram [ECG] [EKG]: Secondary | ICD-10-CM

## 2013-02-22 DIAGNOSIS — J449 Chronic obstructive pulmonary disease, unspecified: Secondary | ICD-10-CM | POA: Diagnosis not present

## 2013-02-22 DIAGNOSIS — I10 Essential (primary) hypertension: Secondary | ICD-10-CM | POA: Insufficient documentation

## 2013-02-22 DIAGNOSIS — J4489 Other specified chronic obstructive pulmonary disease: Secondary | ICD-10-CM | POA: Insufficient documentation

## 2013-02-22 MED ORDER — REGADENOSON 0.4 MG/5ML IV SOLN
0.4000 mg | Freq: Once | INTRAVENOUS | Status: AC
  Administered 2013-02-22: 0.4 mg via INTRAVENOUS

## 2013-02-22 MED ORDER — TECHNETIUM TC 99M SESTAMIBI GENERIC - CARDIOLITE
10.3000 | Freq: Once | INTRAVENOUS | Status: AC | PRN
  Administered 2013-02-22: 10 via INTRAVENOUS

## 2013-02-22 MED ORDER — TECHNETIUM TC 99M SESTAMIBI GENERIC - CARDIOLITE
31.0000 | Freq: Once | INTRAVENOUS | Status: AC | PRN
  Administered 2013-02-22: 31 via INTRAVENOUS

## 2013-02-22 NOTE — Procedures (Addendum)
[place] [address] [place] [address] [phone]  Cardiology Nuclear Med Study  Donald Rios is a 73 y.o. male     MRN : [id number]     DOB: [birth year]  Procedure Date: 02/22/2013  Nuclear Med Background Indication for Stress Test:  Graft Patency History:  Asthma, COPD and CAD;MI;CABG X4-2000;TVR;MVR;LAE;ICM Cardiac Risk Factors: Family History - CAD, History of Smoking, Hypertension, Lipids and NIDDM  Symptoms:  Patient denies symptoms at this time.   Nuclear Pre-Procedure Caffeine/Decaff Intake:  7:00pm NPO After: 5:00am   IV Site: R Forearm  IV 0.9% NS with Angio Cath:  22g  Chest Size (in):  38"  IV Started by: Azucena Cecil, RN  Height: 5' 8.5" (1.74 m)  Cup Size: n/a  BMI:  Body mass index is 24.57 kg/(m^2). Weight:  164 lb (74.39 kg)   Tech Comments:  n/a    Nuclear Med Study 1 or 2 day study: 1 day  Stress Test Type:  [place] Authorizing Provider:  Lyman Bishop, MD   Resting Radionuclide: Technetium 40m Sestamibi  Resting Radionuclide Dose: 10.3 mCi   Stress Radionuclide:  Technetium 59m Sestamibi  Stress Radionuclide Dose: 31.0 mCi           Stress Protocol Rest HR: 55 Stress HR: 71  Rest BP: 129/57 Stress BP:137/65  Exercise Time (min): n/a METS: n/a          Dose of Adenosine (mg):  n/a Dose of Lexiscan: 0.4 mg  Dose of Atropine (mg): n/a Dose of Dobutamine: n/a mcg/kg/min (at max HR)  Stress Test Technologist: Mellody Memos, CCT Nuclear Technologist: Imagene Riches, CNMT   Rest Procedure:  Myocardial perfusion imaging was performed at rest 45 minutes following the intravenous administration of Technetium 79m Sestamibi. Stress Procedure:  The patient received IV Lexiscan 0.4 mg over 15-seconds.  Technetium 29m Sestamibi injected at 30-seconds.  There were no significant changes with Lexiscan.  Quantitative spect images were obtained after a 45 minute delay.  Transient Ischemic  Dilatation (Normal <1.22):  1.08 Lung/Heart Ratio (Normal <0.45):  0.30 QGS EDV:  60 ml QGS ESV:  49 ml LV Ejection Fraction: 49%  Rest ECG: NSR with non-specific ST-T wave changes  Stress ECG: No significant change from baseline ECG  QPS Raw Data Images:  Normal; no motion artifact; normal heart/lung ratio. Stress Images:  decreased uptake in the basal to mid inferolateral walls Rest Images:  decreased uptake in the basal to mid inferolateral walls Subtraction (SDS):  No evidence of ischemia.  Impression Exercise Capacity:  Lexiscan with no exercise. BP Response:  Normal blood pressure response. Clinical Symptoms:  No significant symptoms noted. ECG Impression:  No significant ST segment change suggestive of ischemia. Comparison with Prior Nuclear Study: No significant change from previous study  Overall Impression:  Intermediate risk stress nuclear study with moderate-sized inferolateral scar. No ischemia.  LV Wall Motion:  Inferolateral hypokinesis - EF 49%.  Pixie Casino, MD, [facility] Board Certified in Nuclear Cardiology Attending Cardiologist [place], MD  02/22/2013 1:19 PM

## 2013-02-26 ENCOUNTER — Telehealth: Payer: Self-pay | Admitting: Family Medicine

## 2013-02-26 ENCOUNTER — Encounter: Payer: Self-pay | Admitting: Family Medicine

## 2013-02-26 DIAGNOSIS — I10 Essential (primary) hypertension: Secondary | ICD-10-CM

## 2013-02-26 MED ORDER — CLONIDINE HCL 0.2 MG PO TABS: 0.2000 mg | ORAL_TABLET | Freq: Two times a day (BID) | ORAL | Status: DC

## 2013-02-26 NOTE — Telephone Encounter (Signed)
Medication refill for one time only.  Patient needs to be seen.  Letter sent for patient to call and schedule 

## 2013-03-02 ENCOUNTER — Other Ambulatory Visit: Payer: Self-pay | Admitting: Family Medicine

## 2013-03-02 ENCOUNTER — Telehealth: Payer: Self-pay | Admitting: Family Medicine

## 2013-03-02 DIAGNOSIS — I10 Essential (primary) hypertension: Secondary | ICD-10-CM

## 2013-03-02 MED ORDER — CLONIDINE HCL 0.2 MG PO TABS: 0.2000 mg | ORAL_TABLET | Freq: Two times a day (BID) | ORAL | Status: DC

## 2013-03-02 NOTE — Telephone Encounter (Signed)
Done

## 2013-03-02 NOTE — Telephone Encounter (Signed)
Call back number [phone] Pt is suppose to have this prescription for 90 days not 30 days and he would like it changed  cloNIDine (CATAPRES) 0.2 MG tablet  Pharmacy is [facility] on [address]

## 2013-03-14 DIAGNOSIS — L259 Unspecified contact dermatitis, unspecified cause: Secondary | ICD-10-CM | POA: Diagnosis not present

## 2013-03-26 ENCOUNTER — Other Ambulatory Visit: Payer: Self-pay | Admitting: Family Medicine

## 2013-03-26 ENCOUNTER — Other Ambulatory Visit: Payer: Medicare Other

## 2013-03-26 DIAGNOSIS — E119 Type 2 diabetes mellitus without complications: Secondary | ICD-10-CM

## 2013-03-26 DIAGNOSIS — E785 Hyperlipidemia, unspecified: Secondary | ICD-10-CM | POA: Diagnosis not present

## 2013-03-26 LAB — COMPLETE METABOLIC PANEL WITH GFR
ALK PHOS: 56 U/L (ref 39–117)
ALT: 9 U/L (ref 0–53)
AST: 14 U/L (ref 0–37)
Albumin: 4.2 g/dL (ref 3.5–5.2)
BILIRUBIN TOTAL: 0.8 mg/dL (ref 0.2–1.2)
BUN: 20 mg/dL (ref 6–23)
CO2: 26 mEq/L (ref 19–32)
Calcium: 9.3 mg/dL (ref 8.4–10.5)
Chloride: 109 mEq/L (ref 96–112)
Creat: 1.04 mg/dL (ref 0.50–1.35)
GFR, EST NON AFRICAN AMERICAN: 72 mL/min
GFR, Est African American: 83 mL/min
Glucose, Bld: 113 mg/dL — ABNORMAL HIGH (ref 70–99)
Potassium: 4.5 mEq/L (ref 3.5–5.3)
Sodium: 141 mEq/L (ref 135–145)
Total Protein: 6.1 g/dL (ref 6.0–8.3)

## 2013-03-26 LAB — LIPID PANEL
CHOL/HDL RATIO: 2.4 ratio
CHOLESTEROL: 128 mg/dL (ref 0–200)
HDL: 54 mg/dL (ref >39)
LDL Cholesterol: 59 mg/dL (ref 0–99)
Triglycerides: 73 mg/dL (ref <150)
VLDL: 15 mg/dL (ref 0–40)

## 2013-03-26 LAB — HEMOGLOBIN A1C
Hgb A1c MFr Bld: 5.8 % — ABNORMAL HIGH (ref <5.7)
MEAN PLASMA GLUCOSE: 120 mg/dL — AB (ref <117)

## 2013-03-26 NOTE — Telephone Encounter (Signed)
Refill appropriate and filled per protocol. 

## 2013-03-30 ENCOUNTER — Encounter: Payer: Self-pay | Admitting: Family Medicine

## 2013-03-30 ENCOUNTER — Ambulatory Visit (INDEPENDENT_AMBULATORY_CARE_PROVIDER_SITE_OTHER): Payer: Medicare Other | Admitting: Family Medicine

## 2013-03-30 VITALS — BP 132/78 | HR 68 | Temp 97.0°F | Resp 16 | Ht 68.5 in | Wt 167.0 lb

## 2013-03-30 DIAGNOSIS — E119 Type 2 diabetes mellitus without complications: Secondary | ICD-10-CM | POA: Diagnosis not present

## 2013-03-30 DIAGNOSIS — I10 Essential (primary) hypertension: Secondary | ICD-10-CM

## 2013-03-30 DIAGNOSIS — I2510 Atherosclerotic heart disease of native coronary artery without angina pectoris: Secondary | ICD-10-CM | POA: Diagnosis not present

## 2013-03-30 MED ORDER — SIMVASTATIN 80 MG PO TABS: 40.0000 mg | ORAL_TABLET | Freq: Every day | ORAL | Status: DC

## 2013-03-30 MED ORDER — METFORMIN HCL 500 MG PO TABS: ORAL_TABLET | ORAL | Status: DC

## 2013-03-30 MED ORDER — AMLODIPINE BESYLATE 10 MG PO TABS: ORAL_TABLET | ORAL | Status: DC

## 2013-03-30 MED ORDER — CLONIDINE HCL 0.2 MG PO TABS: ORAL_TABLET | ORAL | Status: DC

## 2013-03-30 MED ORDER — ALLOPURINOL 300 MG PO TABS: ORAL_TABLET | ORAL | Status: DC

## 2013-03-30 MED ORDER — BENAZEPRIL HCL 40 MG PO TABS: ORAL_TABLET | ORAL | Status: DC

## 2013-03-30 MED ORDER — METOPROLOL TARTRATE 50 MG PO TABS: ORAL_TABLET | ORAL | Status: DC

## 2013-03-30 MED ORDER — SIMVASTATIN 40 MG PO TABS: 40.0000 mg | ORAL_TABLET | Freq: Every day | ORAL | Status: DC

## 2013-03-30 NOTE — Progress Notes (Signed)
Subjective:    Patient ID: Donald Rios, male    DOB: [birth year], 73 y.o.   MRN: [id number]  HPI Patient is here for followup of his multiple medical conditions. He has a history of coronary artery disease, diabetes mellitus type 2, hyperlipidemia, and hypertension. He denies any chest pain, shortness of breath, dyspnea on exertion. He denies any edema. He denies any symptoms of claudication or peripheral vascular disease. He denies any blurry vision or neuropathy in the feet. He is very compliant with the low carbohydrate diet and getting regular daily aerobic exercise. He denies any myalgias or right upper quadrant pain on his statin medication. He gets his eyes examined on a regular basis. He is currently taking an aspirin. His most recent labwork as listed below: Appointment on 03/26/2013  Component Date Value Ref Range Status  . Sodium 03/26/2013 141  135 - 145 mEq/L Final  . Potassium 03/26/2013 4.5  3.5 - 5.3 mEq/L Final  . Chloride 03/26/2013 109  96 - 112 mEq/L Final  . CO2 03/26/2013 26  19 - 32 mEq/L Final  . Glucose, Bld 03/26/2013 113* 70 - 99 mg/dL Final  . BUN 03/26/2013 20  6 - 23 mg/dL Final  . Creat 03/26/2013 1.04  0.50 - 1.35 mg/dL Final  . Total Bilirubin 03/26/2013 0.8  0.2 - 1.2 mg/dL Final  . Alkaline Phosphatase 03/26/2013 56  39 - 117 U/L Final  . AST 03/26/2013 14  0 - 37 U/L Final  . ALT 03/26/2013 9  0 - 53 U/L Final  . Total Protein 03/26/2013 6.1  6.0 - 8.3 g/dL Final  . Albumin 03/26/2013 4.2  3.5 - 5.2 g/dL Final  . Calcium 03/26/2013 9.3  8.4 - 10.5 mg/dL Final  . GFR, Est African American 03/26/2013 83   Final  . GFR, Est Non African American 03/26/2013 72   Final   Comment:                            The estimated GFR is a calculation valid for adults (>=92 years old)                          that uses the CKD-EPI algorithm to adjust for age and sex. It is                            not to be used for children, pregnant women, hospitalized patients,                              patients on dialysis, or with rapidly changing kidney function.                          According to the NKDEP, eGFR >89 is normal, 60-89 shows mild                          impairment, 30-59 shows moderate impairment, 15-29 shows severe                          impairment and <15 is ESRD.                             Marland Kitchen  Cholesterol 03/26/2013 128  0 - 200 mg/dL Final   Comment: ATP III Classification:                                < 200        mg/dL        Desirable                               200 - 239     mg/dL        Borderline High                               >= 240        mg/dL        High                             . Triglycerides 03/26/2013 73  <150 mg/dL Final  . HDL 03/26/2013 54  >39 mg/dL Final  . Total CHOL/HDL Ratio 03/26/2013 2.4   Final  . VLDL 03/26/2013 15  0 - 40 mg/dL Final  . LDL Cholesterol 03/26/2013 59  0 - 99 mg/dL Final   Comment:                            Total Cholesterol/HDL Ratio:CHD Risk                                                 Coronary Heart Disease Risk Table                                                                 Men       Women                                   1/2 Average Risk              3.4        3.3                                       Average Risk              5.0        4.4                                    2X Average Risk              9.6        7.1  3X Average Risk             23.4       11.0                          Use the calculated Patient Ratio above and the CHD Risk table                           to determine the patient's CHD Risk.                          ATP III Classification (LDL):                                < 100        mg/dL         Optimal                               100 - 129     mg/dL         Near or Above Optimal                               130 - 159     mg/dL         Borderline High                               160 - 189     mg/dL          High                                > 190        mg/dL         Very High                             . Hemoglobin A1C 03/26/2013 5.8* <5.7 % Final   Comment:                                                                                                 According to the ADA Clinical Practice Recommendations for 2011, when                          HbA1c is used as a screening test:                                                       >=6.5%  Diagnostic of Diabetes Mellitus                                     (if abnormal result is confirmed)                                                     5.7-6.4%   Increased risk of developing Diabetes Mellitus                                                     References:Diagnosis and Classification of Diabetes Mellitus,Diabetes                          RWER,1540,08(QPYPP 1):S62-S69 and Standards of Medical Care in                                  Diabetes - 2011,Diabetes Care,2011,34 (Suppl 1):S11-S61.                             . Mean Plasma Glucose 03/26/2013 120* <117 mg/dL Final   Past Medical History  Diagnosis Date  . Asthma   . 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 Mild tricuspid regurgitation   . Mitral regurgitation   . Atrial enlargement, left   . Hearing loss   . Diverticulosis   . ED (erectile dysfunction)   . Hiatal hernia   . Gout   . Diabetes mellitus   . GERD (gastroesophageal reflux disease)   . Hyperlipidemia   . Hypertension   . History of MI (myocardial infarction)   . Ischemic cardiomyopathy     EF 45%, with inferior wall motion abnormality   . History of nuclear stress test 08/07/2010    dipyridamole; EKG negative for ischemia, low risk scan    Past Surgical History  Procedure Laterality Date  . Coronary artery bypass graft  2000    LIMA to LAD, free IMA to OM2, sequential graft to PLA & PLD  . Transthoracic echocardiogram  09/07/2010    EF 50-93%; LV systolic function mildly reduced; LA  mildly dilated; mild-mod MR & mild-mod TR; aortic root sclerosis/calcification;    Current Outpatient Prescriptions on File Prior to Visit  Medication Sig Dispense Refill  . aspirin 81 MG tablet Take 81 mg by mouth daily.         No current facility-administered medications on file prior to visit.   No Known Allergies History   Social History  . Marital Status: Married    Spouse Name: N/A    Number of Children: N/A  . Years of Education: N/A   Occupational History  . Not on file.   Social History Main Topics  . Smoking status: Former Smoker    Quit date: 11/04/1985  . Smokeless tobacco: Never Used  . Alcohol Use: Yes  . Drug Use: Yes  . Sexual Activity: Not on file   Other Topics Concern  . Not on file  Social History Narrative  . No narrative on file      Review of Systems  All other systems reviewed and are negative.       Objective:   Physical Exam  Vitals reviewed. Constitutional: He appears well-developed and well-nourished. No distress.  HENT:  Mouth/Throat: Oropharynx is clear and moist.  Eyes: Conjunctivae and EOM are normal. Pupils are equal, round, and reactive to light. No scleral icterus.  Neck: Neck supple. No JVD present. No thyromegaly present.  Cardiovascular: Normal rate, regular rhythm, normal heart sounds and intact distal pulses.  Exam reveals no gallop and no friction rub.   No murmur heard. Pulmonary/Chest: Effort normal and breath sounds normal. No respiratory distress. He has no wheezes. He has no rales. He exhibits no tenderness.  Abdominal: Soft. Bowel sounds are normal. He exhibits no distension and no mass. There is no tenderness. There is no rebound and no guarding.  Musculoskeletal: He exhibits no edema.  Lymphadenopathy:    He has no cervical adenopathy.  Skin: Skin is warm. No rash noted. He is not diaphoretic. No erythema. No pallor.   he does have a cerumen impaction in his right ear which we removed with  irrigation        Assessment & Plan:  1. Type II or unspecified type diabetes mellitus without mention of complication, not stated as uncontrolled Diabetes is well controlled. I recommended the patient discontinue metformin. I will check a urine microalbumin. - Microalbumin, urine  2. HTN (hypertension) Pressure is outstanding. Continue current medications at the present dosages.  3. CAD (coronary artery disease) Patient is asymptomatic. He is getting regular aerobic exercise. His cholesterol is well below the goal LDL of 70. He is taking a daily aspirin. He denies any angina. Continue current medications at the present dosages.

## 2013-05-06 ENCOUNTER — Inpatient Hospital Stay (HOSPITAL_COMMUNITY)
Admission: EM | Admit: 2013-05-06 | Discharge: 2013-05-08 | DRG: 379 | Disposition: A | Discharge status: Home / Self Care | Payer: Medicare Other | Attending: Internal Medicine | Admitting: Internal Medicine

## 2013-05-06 ENCOUNTER — Encounter (HOSPITAL_COMMUNITY): Payer: Self-pay | Admitting: Emergency Medicine

## 2013-05-06 DIAGNOSIS — Z833 Family history of diabetes mellitus: Secondary | ICD-10-CM

## 2013-05-06 DIAGNOSIS — Z823 Family history of stroke: Secondary | ICD-10-CM | POA: Diagnosis not present

## 2013-05-06 DIAGNOSIS — K5730 Diverticulosis of large intestine without perforation or abscess without bleeding: Secondary | ICD-10-CM | POA: Diagnosis not present

## 2013-05-06 DIAGNOSIS — J45909 Unspecified asthma, uncomplicated: Secondary | ICD-10-CM | POA: Diagnosis not present

## 2013-05-06 DIAGNOSIS — K219 Gastro-esophageal reflux disease without esophagitis: Secondary | ICD-10-CM

## 2013-05-06 DIAGNOSIS — I079 Rheumatic tricuspid valve disease, unspecified: Secondary | ICD-10-CM | POA: Diagnosis not present

## 2013-05-06 DIAGNOSIS — K8020 Calculus of gallbladder without cholecystitis without obstruction: Secondary | ICD-10-CM | POA: Diagnosis not present

## 2013-05-06 DIAGNOSIS — Z7982 Long term (current) use of aspirin: Secondary | ICD-10-CM

## 2013-05-06 DIAGNOSIS — Z860100 Personal history of colon polyps, unspecified: Secondary | ICD-10-CM

## 2013-05-06 DIAGNOSIS — Z8249 Family history of ischemic heart disease and other diseases of the circulatory system: Secondary | ICD-10-CM | POA: Diagnosis not present

## 2013-05-06 DIAGNOSIS — I071 Rheumatic tricuspid insufficiency: Secondary | ICD-10-CM

## 2013-05-06 DIAGNOSIS — M109 Gout, unspecified: Secondary | ICD-10-CM | POA: Diagnosis present

## 2013-05-06 DIAGNOSIS — I059 Rheumatic mitral valve disease, unspecified: Secondary | ICD-10-CM | POA: Diagnosis not present

## 2013-05-06 DIAGNOSIS — E119 Type 2 diabetes mellitus without complications: Secondary | ICD-10-CM | POA: Diagnosis not present

## 2013-05-06 DIAGNOSIS — K449 Diaphragmatic hernia without obstruction or gangrene: Secondary | ICD-10-CM | POA: Diagnosis present

## 2013-05-06 DIAGNOSIS — E785 Hyperlipidemia, unspecified: Secondary | ICD-10-CM | POA: Diagnosis present

## 2013-05-06 DIAGNOSIS — K920 Hematemesis: Secondary | ICD-10-CM | POA: Insufficient documentation

## 2013-05-06 DIAGNOSIS — I252 Old myocardial infarction: Secondary | ICD-10-CM

## 2013-05-06 DIAGNOSIS — I2589 Other forms of chronic ischemic heart disease: Secondary | ICD-10-CM | POA: Diagnosis present

## 2013-05-06 DIAGNOSIS — I10 Essential (primary) hypertension: Secondary | ICD-10-CM | POA: Diagnosis present

## 2013-05-06 DIAGNOSIS — K250 Acute gastric ulcer with hemorrhage: Secondary | ICD-10-CM | POA: Diagnosis not present

## 2013-05-06 DIAGNOSIS — H9190 Unspecified hearing loss, unspecified ear: Secondary | ICD-10-CM | POA: Diagnosis present

## 2013-05-06 DIAGNOSIS — Z79899 Other long term (current) drug therapy: Secondary | ICD-10-CM

## 2013-05-06 DIAGNOSIS — K5790 Diverticulosis of intestine, part unspecified, without perforation or abscess without bleeding: Secondary | ICD-10-CM

## 2013-05-06 DIAGNOSIS — I25810 Atherosclerosis of coronary artery bypass graft(s) without angina pectoris: Secondary | ICD-10-CM | POA: Diagnosis not present

## 2013-05-06 DIAGNOSIS — I340 Nonrheumatic mitral (valve) insufficiency: Secondary | ICD-10-CM

## 2013-05-06 DIAGNOSIS — I517 Cardiomegaly: Secondary | ICD-10-CM

## 2013-05-06 DIAGNOSIS — Z87891 Personal history of nicotine dependence: Secondary | ICD-10-CM | POA: Diagnosis not present

## 2013-05-06 DIAGNOSIS — I2510 Atherosclerotic heart disease of native coronary artery without angina pectoris: Secondary | ICD-10-CM | POA: Diagnosis present

## 2013-05-06 HISTORY — DX: Acute gastric ulcer with hemorrhage: K25.0

## 2013-05-06 HISTORY — DX: Endocarditis, valve unspecified: I38

## 2013-05-06 HISTORY — DX: Polyp of colon: K63.5

## 2013-05-06 LAB — CBC WITH DIFFERENTIAL/PLATELET
BASOS ABS: 0 10*3/uL (ref 0.0–0.1)
Basophils Absolute: 0 10*3/uL (ref 0.0–0.1)
Basophils Relative: 0 % (ref 0–1)
Basophils Relative: 0 % (ref 0–1)
Eosinophils Absolute: 0 10*3/uL (ref 0.0–0.7)
Eosinophils Absolute: 0 10*3/uL (ref 0.0–0.7)
Eosinophils Relative: 0 % (ref 0–5)
Eosinophils Relative: 0 % (ref 0–5)
HCT: 39 % (ref 39.0–52.0)
HCT: 36.3 % — ABNORMAL LOW (ref 39.0–52.0)
Hemoglobin: 12.5 g/dL — ABNORMAL LOW (ref 13.0–17.0)
Hemoglobin: 11.8 g/dL — ABNORMAL LOW (ref 13.0–17.0)
LYMPHS PCT: 15 % (ref 12–46)
Lymphocytes Relative: 11 % — ABNORMAL LOW (ref 12–46)
Lymphs Abs: 1.5 10*3/uL (ref 0.7–4.0)
Lymphs Abs: 1.5 10*3/uL (ref 0.7–4.0)
MCH: 30.3 pg (ref 26.0–34.0)
MCH: 30.2 pg (ref 26.0–34.0)
MCHC: 32.1 g/dL (ref 30.0–36.0)
MCHC: 32.5 g/dL (ref 30.0–36.0)
MCV: 94.4 fL (ref 78.0–100.0)
MCV: 92.8 fL (ref 78.0–100.0)
MONO ABS: 0.4 10*3/uL (ref 0.1–1.0)
MONOS PCT: 3 % (ref 3–12)
Monocytes Absolute: 0.4 K/uL (ref 0.1–1.0)
Monocytes Relative: 3 % (ref 3–12)
NEUTROS ABS: 8.5 10*3/uL — AB (ref 1.7–7.7)
Neutro Abs: 12.1 K/uL — ABNORMAL HIGH (ref 1.7–7.7)
Neutrophils Relative %: 86 % — ABNORMAL HIGH (ref 43–77)
Neutrophils Relative %: 82 % — ABNORMAL HIGH (ref 43–77)
Platelets: 224 10*3/uL (ref 150–400)
Platelets: 211 10*3/uL (ref 150–400)
RBC: 4.13 MIL/uL — ABNORMAL LOW (ref 4.22–5.81)
RBC: 3.91 MIL/uL — AB (ref 4.22–5.81)
RDW: 13.6 % (ref 11.5–15.5)
RDW: 13.5 % (ref 11.5–15.5)
WBC: 14.1 10*3/uL — ABNORMAL HIGH (ref 4.0–10.5)
WBC: 10.4 10*3/uL (ref 4.0–10.5)

## 2013-05-06 LAB — COMPREHENSIVE METABOLIC PANEL
ALT: 11 U/L (ref 0–53)
AST: 15 U/L (ref 0–37)
CO2: 23 mEq/L (ref 19–32)
Calcium: 9.2 mg/dL (ref 8.4–10.5)
Creatinine, Ser: 1.13 mg/dL (ref 0.50–1.35)
Sodium: 143 mEq/L (ref 137–147)
Total Protein: 6.2 g/dL (ref 6.0–8.3)

## 2013-05-06 LAB — COMPREHENSIVE METABOLIC PANEL WITH GFR
Albumin: 3.6 g/dL (ref 3.5–5.2)
Alkaline Phosphatase: 58 U/L (ref 39–117)
BUN: 32 mg/dL — ABNORMAL HIGH (ref 6–23)
Chloride: 107 meq/L (ref 96–112)
GFR calc Af Amer: 74 mL/min — ABNORMAL LOW (ref >90)
GFR calc non Af Amer: 64 mL/min — ABNORMAL LOW (ref >90)
Glucose, Bld: 165 mg/dL — ABNORMAL HIGH (ref 70–99)
Potassium: 4.7 meq/L (ref 3.7–5.3)
Total Bilirubin: 0.6 mg/dL (ref 0.3–1.2)

## 2013-05-06 LAB — URINALYSIS, ROUTINE W REFLEX MICROSCOPIC
Bilirubin Urine: NEGATIVE
Glucose, UA: NEGATIVE mg/dL
Hgb urine dipstick: NEGATIVE
Ketones, ur: 15 mg/dL — AB
Leukocytes, UA: NEGATIVE
Nitrite: NEGATIVE
Protein, ur: NEGATIVE mg/dL
Specific Gravity, Urine: 1.024 (ref 1.005–1.030)
Urobilinogen, UA: 0.2 mg/dL (ref 0.0–1.0)
pH: 5 (ref 5.0–8.0)

## 2013-05-06 LAB — LIPASE, BLOOD: Lipase: 46 U/L (ref 11–59)

## 2013-05-06 LAB — POC OCCULT BLOOD, ED: Fecal Occult Bld: NEGATIVE

## 2013-05-06 MED ORDER — PANTOPRAZOLE SODIUM 40 MG IV SOLR
80.0000 mg | Freq: Once | INTRAVENOUS | Status: AC
  Administered 2013-05-07: 80 mg via INTRAVENOUS
  Filled 2013-05-06: qty 80

## 2013-05-06 NOTE — ED Provider Notes (Signed)
CSN: [id number]     Arrival date & time 05/06/13  1825 History   First MD Initiated Contact with Patient 05/06/13 2022     Chief Complaint  Patient presents with  . Hemoptysis  . Abdominal Pain     (Consider location/radiation/quality/duration/timing/severity/associated sxs/prior Treatment) The history is provided by the patient.   history of present illness: 73 year old male who presents with chief complaint of hemoptysis. Onset of symptoms was this evening just prior to arrival. Patient reports that he had "gas". He took some Gas-X with minimal relief. He then became very nauseous and felt diaphoretic. He had a bowel movement that was normal but it did not relieve his symptoms. He then had a large episode of emesis that he states was "all blood". It is described as bright red. Patient had significant relief of symptoms after vomiting. At time of examination he denies any nausea or abdominal pain. No lightheadedness, dizziness, chest pain, shortness of breath. He has not had any melena or hematochezia.  Past Medical History  Diagnosis Date  . Asthma   . 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 Mild tricuspid regurgitation   . Mitral regurgitation   . Atrial enlargement, left   . Hearing loss   . Diverticulosis   . ED (erectile dysfunction)   . Hiatal hernia   . Gout   . Diabetes mellitus   . GERD (gastroesophageal reflux disease)   . Hyperlipidemia   . Hypertension   . History of MI (myocardial infarction)   . Ischemic cardiomyopathy     EF 45%, with inferior wall motion abnormality   . History of nuclear stress test 08/07/2010    dipyridamole; EKG negative for ischemia, low risk scan    Past Surgical History  Procedure Laterality Date  . Coronary artery bypass graft  2000    LIMA to LAD, free IMA to OM2, sequential graft to PLA & PLD  . Transthoracic echocardiogram  09/07/2010    EF 79-15%; LV systolic function mildly reduced; LA mildly dilated; mild-mod MR &  mild-mod TR; aortic root sclerosis/calcification;    Family History  Problem Relation Age of Onset  . Stroke Father   . Stroke Maternal Grandmother   . Stroke Maternal Grandfather   . Hypertension Father   . Diabetes Brother    History  Substance Use Topics  . Smoking status: Former Smoker    Quit date: 11/04/1985  . Smokeless tobacco: Never Used  . Alcohol Use: Yes    Review of Systems  Constitutional: Negative for fever and chills.  HENT: Negative.   Eyes: Negative.   Respiratory: Negative for cough and shortness of breath.   Cardiovascular: Negative for chest pain and palpitations.  Gastrointestinal: Positive for nausea and vomiting. Negative for diarrhea, constipation and blood in stool.  Genitourinary: Negative.   Musculoskeletal: Negative.   Skin: Negative.   Neurological: Negative.   All other systems reviewed and are negative.     Allergies  Review of patient's allergies indicates no known allergies.  Home Medications   Prior to Admission medications   Medication Sig Start Date End Date Taking? Authorizing Provider  allopurinol (ZYLOPRIM) 300 MG tablet Take 300 mg by mouth daily.   Yes Historical Provider, MD  amLODipine (NORVASC) 10 MG tablet Take 10 mg by mouth daily.   Yes Historical Provider, MD  aspirin 81 MG tablet Take 81 mg by mouth daily.   Yes Historical Provider, MD  benazepril (LOTENSIN) 40 MG tablet Take 40  mg by mouth daily.   Yes Historical Provider, MD  cloNIDine (CATAPRES) 0.2 MG tablet Take 0.2 mg by mouth 2 (two) times daily.   Yes Historical Provider, MD  metFORMIN (GLUCOPHAGE) 500 MG tablet Take 500 mg by mouth daily.   Yes Historical Provider, MD  metoprolol (LOPRESSOR) 50 MG tablet Take 75 mg by mouth 2 (two) times daily.   Yes Historical Provider, MD  simvastatin (ZOCOR) 40 MG tablet Take 40 mg by mouth daily.   Yes Historical Provider, MD   BP 138/71  Pulse 120  Temp(Src) 97.7 F (36.5 C) (Oral)  Resp 19  Ht 5\' 8"  (1.727 m)  Wt  160 lb (72.576 kg)  BMI 24.33 kg/m2  SpO2 98% Physical Exam  Nursing note and vitals reviewed. Constitutional: He is oriented to person, place, and time. He appears well-developed and well-nourished. No distress.  HENT:  Head: Normocephalic and atraumatic.  Eyes: Conjunctivae are normal.  Neck: Neck supple.  Cardiovascular: Normal rate, regular rhythm, normal heart sounds and intact distal pulses.   Pulmonary/Chest: Effort normal and breath sounds normal. He has no wheezes. He has no rales.  Abdominal: Soft. He exhibits no distension. There is no tenderness.  Musculoskeletal: Normal range of motion.  Neurological: He is alert and oriented to person, place, and time.  Skin: Skin is warm and dry.    ED Course  Procedures (including critical care time) Labs Review Labs Reviewed  CBC WITH DIFFERENTIAL - Abnormal; Notable for the following:    WBC 14.1 (*)    RBC 4.13 (*)    Hemoglobin 12.5 (*)    Neutrophils Relative % 86 (*)    Neutro Abs 12.1 (*)    Lymphocytes Relative 11 (*)    All other components within normal limits  COMPREHENSIVE METABOLIC PANEL - Abnormal; Notable for the following:    Glucose, Bld 165 (*)    BUN 32 (*)    GFR calc non Af Amer 64 (*)    GFR calc Af Amer 74 (*)    All other components within normal limits  URINALYSIS, ROUTINE W REFLEX MICROSCOPIC - Abnormal; Notable for the following:    Ketones, ur 15 (*)    All other components within normal limits  CBC WITH DIFFERENTIAL - Abnormal; Notable for the following:    RBC 3.91 (*)    Hemoglobin 11.8 (*)    HCT 36.3 (*)    Neutrophils Relative % 82 (*)    Neutro Abs 8.5 (*)    All other components within normal limits  LIPASE, BLOOD  PROTIME-INR  APTT  POC OCCULT BLOOD, ED  TYPE AND SCREEN    Imaging Review No results found.   EKG Interpretation None      MDM   Final diagnoses:  Bloody emesis    73 year old male with history of coronary artery disease, hypertension, diabetes, GERD,  hyperlipidemia who presents for an episode of hemoptysis. AF VSS. No abnormal pain or tenderness to palpation on exam. Brown stool, Hemoccult negative.  Labs remarkable for hemoglobin of 12.5 down from previous of 15. This was repeated in the emergency department and continued to trend down to 11.8. Patient also had leukocytosis of 14 and elevated BUN of 32 concerning for GI bleed. Patient given protonic 80 mg IV in the emergency department.  With worsening anemia, hospitalist was consult it and will admit for further management.  Renaldo Reel, MD 05/06/13 2340

## 2013-05-06 NOTE — H&P (Signed)
[facility] History and Physical  Patient: Donald Rios  BMW:[id number]  DOB: [birth year]  DOS: the patient was seen and examined on 05/06/2013 PCP: Odette Fraction, MD  Chief Complaint: Hematemesis  HPI: Donald Rios is a 73 y.o. male with Past medical history of coronary artery disease, hiatal hernia, gout, diabetes, hypertension, 45% EF, The patient presented with the complaint of bloody vomiting. He mentions that he was at his baseline during the day. In the afternoon at around 5 PM he started having complaint of abdominal discomfort with a sense that his abdomen is bloating with gas. He was diaphoretic and weak with that. He had a bowel movement after which she felt better in his abdomen, but continues to remain weak and diaphoretic. After a few minutes of that he had a sudden sense of nausea which was followed by an episode of vomiting, he found that there was bright red blood in his vomiting. After the vomiting he felt better but due to his bed red blood he presented to the ED. At present he denies any complaint of fever, chills, heartburn, nausea, vomiting, abdominal pain, abdominal discomfort, burning urination, active bleeding, leg swelling. He denies any similar episode in the past. He denies any weight loss, lymphadenopathy. He mentions he has easy bruising. But no significant uncontrolled bleeding. He denies any outside food.  The patient is coming from home. And at his baseline independent for most of his ADL.  Review of Systems: as mentioned in the history of present illness.  A Comprehensive review of the other systems is negative.  Past Medical History  Diagnosis Date  . Asthma   . 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 Mild tricuspid regurgitation   . Mitral regurgitation   . Atrial enlargement, left   . Hearing loss   . Diverticulosis   . ED (erectile dysfunction)   . Hiatal hernia   . Gout   . Diabetes mellitus   . GERD (gastroesophageal  reflux disease)   . Hyperlipidemia   . Hypertension   . History of MI (myocardial infarction)   . Ischemic cardiomyopathy     EF 45%, with inferior wall motion abnormality   . History of nuclear stress test 08/07/2010    dipyridamole; EKG negative for ischemia, low risk scan    Past Surgical History  Procedure Laterality Date  . Coronary artery bypass graft  2000    LIMA to LAD, free IMA to OM2, sequential graft to PLA & PLD  . Transthoracic echocardiogram  09/07/2010    EF 27-25%; LV systolic function mildly reduced; LA mildly dilated; mild-mod MR & mild-mod TR; aortic root sclerosis/calcification;    Social History:  reports that he quit smoking about 27 years ago. He has never used smokeless tobacco. He reports that he drinks alcohol. He reports that he uses illicit drugs.  No Known Allergies  Family History  Problem Relation Age of Onset  . Stroke Father   . Stroke Maternal Grandmother   . Stroke Maternal Grandfather   . Hypertension Father   . Diabetes Brother     Prior to Admission medications   Medication Sig Start Date End Date Taking? Authorizing Provider  allopurinol (ZYLOPRIM) 300 MG tablet Take 300 mg by mouth daily.   Yes Historical Provider, MD  amLODipine (NORVASC) 10 MG tablet Take 10 mg by mouth daily.   Yes Historical Provider, MD  aspirin 81 MG tablet Take 81 mg by mouth daily.   Yes Historical  Provider, MD  benazepril (LOTENSIN) 40 MG tablet Take 40 mg by mouth daily.   Yes Historical Provider, MD  cloNIDine (CATAPRES) 0.2 MG tablet Take 0.2 mg by mouth 2 (two) times daily.   Yes Historical Provider, MD  metFORMIN (GLUCOPHAGE) 500 MG tablet Take 500 mg by mouth daily.   Yes Historical Provider, MD  metoprolol (LOPRESSOR) 50 MG tablet Take 75 mg by mouth 2 (two) times daily.   Yes Historical Provider, MD  simvastatin (ZOCOR) 40 MG tablet Take 40 mg by mouth daily.   Yes Historical Provider, MD    Physical Exam: Filed Vitals:   05/06/13 2307 05/06/13 2308  05/06/13 2309 05/06/13 2315  BP: 132/58 148/65 138/71 128/61  Pulse: 91 105 120 88  Temp:      TempSrc:      Resp:    17  Height:      Weight:      SpO2:    98%    General: Alert, Awake and Oriented to Time, Place and Person. Appear in mild distress Eyes: PERRL ENT: Oral Mucosa clear moist. Neck: No JVD Cardiovascular: S1 and S2 Present, no Murmur, Peripheral Pulses Present Respiratory: Bilateral Air entry equal and Decreased, Clear to Auscultation,  No Crackles, no wheezes Abdomen: Bowel Sound Present, Soft and Non tender Skin: No Rash Extremities: No Pedal edema, no calf tenderness Neurologic: Grossly no focal neuro deficit.  Labs on Admission:  CBC:  Recent Labs Lab 05/06/13 1850 05/06/13 2207  WBC 14.1* 10.4  NEUTROABS 12.1* 8.5*  HGB 12.5* 11.8*  HCT 39.0 36.3*  MCV 94.4 92.8  PLT 224 211    CMP     Component Value Date/Time   NA 143 05/06/2013 1850   K 4.7 05/06/2013 1850   CL 107 05/06/2013 1850   CO2 23 05/06/2013 1850   GLUCOSE 165* 05/06/2013 1850   BUN 32* 05/06/2013 1850   CREATININE 1.13 05/06/2013 1850   CREATININE 1.04 03/26/2013 0846   CALCIUM 9.2 05/06/2013 1850   PROT 6.2 05/06/2013 1850   ALBUMIN 3.6 05/06/2013 1850   AST 15 05/06/2013 1850   ALT 11 05/06/2013 1850   ALKPHOS 58 05/06/2013 1850   BILITOT 0.6 05/06/2013 1850   GFRNONAA 64* 05/06/2013 1850   GFRNONAA 72 03/26/2013 0846   GFRAA 74* 05/06/2013 1850   GFRAA 83 03/26/2013 0846     Recent Labs Lab 05/06/13 1850  LIPASE 46   No results found for this basename: AMMONIA,  in the last 168 hours  No results found for this basename: CKTOTAL, CKMB, CKMBINDEX, TROPONINI,  in the last 168 hours BNP (last 3 results) No results found for this basename: PROBNP,  in the last 8760 hours  Radiological Exams on Admission: No results found.  EKG: Independently reviewed. unchanged from previous tracings, normal sinus rhythm, nonspecific ST and T waves changes.  Assessment/Plan Principal  Problem:   Upper GI bleed 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   Hiatal hernia   Gout   DM2 (diabetes mellitus, type 2)   GERD (gastroesophageal reflux disease)   Hypertension   1. Upper GI bleed The patient is presenting with complaints of bright red blood and vomiting. This is only one episode. He does not have any significant abdominal discomfort. But he was significantly diaphoretic and weak prior to this episode. His Hemoccult is negative which suggests he does not appear to have chronic ongoing occult blood loss. Would check his INR and APTT and lactic  acid. Would also check his x-ray of abdomen. Hemoglobin has dropped from his baseline therefore I would continue to monitor his CBC every 6 hours. Protonix 80 mg bolus followed by Protonix 40 mg IV every 12 hours. GI consultation will be considered if his hemoglobin drops down further or he becomes hemodynamically unstable or he has further GI bleeding. Patient will be kept n.p.o. except medications. I would hold his aspirin at present.  2. Coronary artery disease At present holding aspirin due to ongoing GI bleed EKG does not show any acute ischemia Troponin negative  3. Diabetes mellitus Holding metformin and placing the patient on sliding scale  4. Gout Continue allopurinol  DVT Prophylaxis: mechanical compression device Nutrition: N.p.o. except medications  Code Status: Full  Family Communication: Wife was present at bedside, opportunity was given to ask question and all questions were answered satisfactorily at the time of interview. Disposition: Admitted to inpatient in telemetry unit.  Author: Berle Mull, MD [facility] Pager: [phone] 05/06/2013, 11:55 PM    If 7PM-7AM, please contact night-coverage [email or web address] Password [email or web address]

## 2013-05-06 NOTE — ED Notes (Signed)
Admitting MD at bedside.

## 2013-05-06 NOTE — ED Notes (Signed)
Pt presents to department for evaluation of hemoptysis and abdominal pain. Onset this evening. States diffuse abdominal pain and gas. Pt also reports becoming sweaty and vomiting bright red blood. Denies pain at the time. Pt is alert and oriented x4.

## 2013-05-06 NOTE — ED Provider Notes (Signed)
I saw and evaluated the patient, reviewed the resident's note and I agree with the findings and plan.   EKG Interpretation None      Pt had some gas and lower abd pain earlier today, had eaten only some trail mix for lunch, was going to go eat dinner with family, had an episode of intense nausea and diaphoresis.  He vomited once, significant blood when he vomited, didn't eat anything red that he knows of.  Here, pt is hemodynamically stable, not dizzy, Hgb is anemic, dropped a little further on recheck after a few hours.  Abd is soft, no guard or rebound. hemoccult is neg, but concern for possible UGI bleed, will consult for admission and can have GI evaluated pt in the AM and watch serial H&H's.    Saddie Benders. Orabelle Rylee, MD 05/06/13 2321

## 2013-05-07 ENCOUNTER — Inpatient Hospital Stay (HOSPITAL_COMMUNITY): Payer: Medicare Other

## 2013-05-07 ENCOUNTER — Encounter (HOSPITAL_COMMUNITY): Payer: Self-pay | Admitting: Emergency Medicine

## 2013-05-07 ENCOUNTER — Encounter (HOSPITAL_COMMUNITY)
Admission: EM | Disposition: A | Discharge status: Home / Self Care | Payer: Self-pay | Source: Home / Self Care | Attending: Internal Medicine

## 2013-05-07 DIAGNOSIS — I25810 Atherosclerosis of coronary artery bypass graft(s) without angina pectoris: Secondary | ICD-10-CM

## 2013-05-07 DIAGNOSIS — I059 Rheumatic mitral valve disease, unspecified: Secondary | ICD-10-CM | POA: Diagnosis not present

## 2013-05-07 DIAGNOSIS — E119 Type 2 diabetes mellitus without complications: Secondary | ICD-10-CM | POA: Diagnosis not present

## 2013-05-07 DIAGNOSIS — K920 Hematemesis: Secondary | ICD-10-CM | POA: Diagnosis not present

## 2013-05-07 DIAGNOSIS — K8020 Calculus of gallbladder without cholecystitis without obstruction: Secondary | ICD-10-CM | POA: Diagnosis not present

## 2013-05-07 DIAGNOSIS — I517 Cardiomegaly: Secondary | ICD-10-CM | POA: Diagnosis not present

## 2013-05-07 DIAGNOSIS — I079 Rheumatic tricuspid valve disease, unspecified: Secondary | ICD-10-CM | POA: Diagnosis not present

## 2013-05-07 DIAGNOSIS — K5730 Diverticulosis of large intestine without perforation or abscess without bleeding: Secondary | ICD-10-CM

## 2013-05-07 DIAGNOSIS — J45909 Unspecified asthma, uncomplicated: Secondary | ICD-10-CM | POA: Diagnosis not present

## 2013-05-07 DIAGNOSIS — K250 Acute gastric ulcer with hemorrhage: Secondary | ICD-10-CM | POA: Diagnosis not present

## 2013-05-07 HISTORY — PX: ESOPHAGOGASTRODUODENOSCOPY: SHX5428

## 2013-05-07 LAB — COMPREHENSIVE METABOLIC PANEL
ALBUMIN: 3.1 g/dL — AB (ref 3.5–5.2)
ALK PHOS: 49 U/L (ref 39–117)
ALT: 10 U/L (ref 0–53)
AST: 13 U/L (ref 0–37)
BUN: 45 mg/dL — ABNORMAL HIGH (ref 6–23)
CO2: 22 mEq/L (ref 19–32)
Calcium: 8.8 mg/dL (ref 8.4–10.5)
Chloride: 110 mEq/L (ref 96–112)
Creatinine, Ser: 0.92 mg/dL (ref 0.50–1.35)
GFR calc Af Amer: >90 mL/min (ref >90)
GFR calc non Af Amer: 83 mL/min — ABNORMAL LOW (ref >90)
Glucose, Bld: 102 mg/dL — ABNORMAL HIGH (ref 70–99)
POTASSIUM: 4.2 meq/L (ref 3.7–5.3)
SODIUM: 142 meq/L (ref 137–147)
Total Bilirubin: 0.6 mg/dL (ref 0.3–1.2)
Total Protein: 5.4 g/dL — ABNORMAL LOW (ref 6.0–8.3)

## 2013-05-07 LAB — CBC WITH DIFFERENTIAL/PLATELET
BASOS PCT: 0 % (ref 0–1)
Basophils Absolute: 0 10*3/uL (ref 0.0–0.1)
EOS PCT: 1 % (ref 0–5)
Eosinophils Absolute: 0.1 10*3/uL (ref 0.0–0.7)
HCT: 33.6 % — ABNORMAL LOW (ref 39.0–52.0)
Hemoglobin: 11 g/dL — ABNORMAL LOW (ref 13.0–17.0)
Lymphocytes Relative: 25 % (ref 12–46)
Lymphs Abs: 1.8 10*3/uL (ref 0.7–4.0)
MCH: 30.6 pg (ref 26.0–34.0)
MCHC: 32.7 g/dL (ref 30.0–36.0)
MCV: 93.3 fL (ref 78.0–100.0)
Monocytes Absolute: 0.6 10*3/uL (ref 0.1–1.0)
Monocytes Relative: 9 % (ref 3–12)
NEUTROS PCT: 65 % (ref 43–77)
Neutro Abs: 4.8 10*3/uL (ref 1.7–7.7)
Platelets: 188 10*3/uL (ref 150–400)
RBC: 3.6 MIL/uL — ABNORMAL LOW (ref 4.22–5.81)
RDW: 13.6 % (ref 11.5–15.5)
WBC: 7.4 10*3/uL (ref 4.0–10.5)

## 2013-05-07 LAB — GLUCOSE, CAPILLARY
GLUCOSE-CAPILLARY: 116 mg/dL — AB (ref 70–99)
GLUCOSE-CAPILLARY: 142 mg/dL — AB (ref 70–99)
Glucose-Capillary: 112 mg/dL — ABNORMAL HIGH (ref 70–99)
Glucose-Capillary: 87 mg/dL (ref 70–99)

## 2013-05-07 LAB — PROTIME-INR
INR: 1.05 (ref 0.00–1.49)
Prothrombin Time: 13.5 seconds (ref 11.6–15.2)

## 2013-05-07 LAB — TROPONIN I: Troponin I: <0.3 ng/mL (ref <0.30)

## 2013-05-07 LAB — LACTIC ACID, PLASMA: LACTIC ACID, VENOUS: 1.1 mmol/L (ref 0.5–2.2)

## 2013-05-07 LAB — CBG MONITORING, ED: Glucose-Capillary: 110 mg/dL — ABNORMAL HIGH (ref 70–99)

## 2013-05-07 LAB — TYPE AND SCREEN
ABO/RH(D): A POS
Antibody Screen: NEGATIVE

## 2013-05-07 LAB — ABO/RH: ABO/RH(D): A POS

## 2013-05-07 LAB — APTT: aPTT: 25 s (ref 24–37)

## 2013-05-07 SURGERY — EGD (ESOPHAGOGASTRODUODENOSCOPY)
Anesthesia: Moderate Sedation

## 2013-05-07 MED ORDER — AMLODIPINE BESYLATE 10 MG PO TABS
10.0000 mg | ORAL_TABLET | Freq: Every day | ORAL | Status: DC
  Administered 2013-05-07: 10 mg via ORAL
  Filled 2013-05-07 (×2): qty 1

## 2013-05-07 MED ORDER — PANTOPRAZOLE SODIUM 40 MG PO TBEC
40.0000 mg | DELAYED_RELEASE_TABLET | Freq: Every day | ORAL | Status: DC
  Administered 2013-05-07 – 2013-05-08 (×2): 40 mg via ORAL
  Filled 2013-05-07 (×2): qty 1

## 2013-05-07 MED ORDER — INSULIN ASPART 100 UNIT/ML ~~LOC~~ SOLN
0.0000 [IU] | Freq: Three times a day (TID) | SUBCUTANEOUS | Status: DC
  Administered 2013-05-08: 1 [IU] via SUBCUTANEOUS

## 2013-05-07 MED ORDER — MIDAZOLAM HCL 10 MG/2ML IJ SOLN
INTRAMUSCULAR | Status: DC | PRN
  Administered 2013-05-07 (×2): 2 mg via INTRAVENOUS

## 2013-05-07 MED ORDER — BUTAMBEN-TETRACAINE-BENZOCAINE 2-2-14 % EX AERO
INHALATION_SPRAY | CUTANEOUS | Status: DC | PRN
  Administered 2013-05-07: 2 via TOPICAL

## 2013-05-07 MED ORDER — ONDANSETRON HCL 4 MG PO TABS: 4.0000 mg | ORAL_TABLET | Freq: Four times a day (QID) | ORAL | Status: DC | PRN

## 2013-05-07 MED ORDER — PANTOPRAZOLE SODIUM 40 MG IV SOLR
40.0000 mg | Freq: Two times a day (BID) | INTRAVENOUS | Status: DC
  Filled 2013-05-07 (×3): qty 40

## 2013-05-07 MED ORDER — METOPROLOL TARTRATE 50 MG PO TABS
75.0000 mg | ORAL_TABLET | Freq: Two times a day (BID) | ORAL | Status: DC
  Administered 2013-05-07 (×2): 75 mg via ORAL
  Filled 2013-05-07 (×5): qty 1

## 2013-05-07 MED ORDER — FENTANYL CITRATE 0.05 MG/ML IJ SOLN
INTRAMUSCULAR | Status: DC | PRN
Start: 2013-05-07 — End: 2013-05-07
  Administered 2013-05-07 (×2): 25 ug via INTRAVENOUS

## 2013-05-07 MED ORDER — SODIUM CHLORIDE 0.9 % IV SOLN: INTRAVENOUS | Status: DC

## 2013-05-07 MED ORDER — ONDANSETRON HCL 4 MG/2ML IJ SOLN: 4.0000 mg | Freq: Four times a day (QID) | INTRAMUSCULAR | Status: DC | PRN

## 2013-05-07 MED ORDER — SODIUM CHLORIDE 0.9 % IJ SOLN: 3.0000 mL | Freq: Two times a day (BID) | INTRAMUSCULAR | Status: DC

## 2013-05-07 MED ORDER — CLONIDINE HCL 0.2 MG PO TABS
0.2000 mg | ORAL_TABLET | Freq: Two times a day (BID) | ORAL | Status: DC
  Administered 2013-05-07 (×2): 0.2 mg via ORAL
  Filled 2013-05-07 (×4): qty 1

## 2013-05-07 MED ORDER — MIDAZOLAM HCL 5 MG/ML IJ SOLN
INTRAMUSCULAR | Status: AC
  Filled 2013-05-07: qty 2

## 2013-05-07 MED ORDER — BENAZEPRIL HCL 40 MG PO TABS
40.0000 mg | ORAL_TABLET | Freq: Every day | ORAL | Status: DC
  Administered 2013-05-07: 40 mg via ORAL
  Filled 2013-05-07 (×2): qty 1

## 2013-05-07 MED ORDER — SODIUM CHLORIDE 0.9 % IV SOLN
INTRAVENOUS | Status: DC
  Administered 2013-05-07 (×2): via INTRAVENOUS

## 2013-05-07 MED ORDER — SIMVASTATIN 40 MG PO TABS
40.0000 mg | ORAL_TABLET | Freq: Every day | ORAL | Status: DC
  Administered 2013-05-07: 40 mg via ORAL
  Filled 2013-05-07 (×2): qty 1

## 2013-05-07 MED ORDER — PANTOPRAZOLE SODIUM 40 MG IV SOLR
40.0000 mg | Freq: Two times a day (BID) | INTRAVENOUS | Status: DC
  Filled 2013-05-07: qty 40

## 2013-05-07 MED ORDER — FENTANYL CITRATE 0.05 MG/ML IJ SOLN
INTRAMUSCULAR | Status: AC
  Filled 2013-05-07: qty 2

## 2013-05-07 MED ORDER — PANTOPRAZOLE SODIUM 40 MG IV SOLR
40.0000 mg | Freq: Once | INTRAVENOUS | Status: DC
  Filled 2013-05-07: qty 40

## 2013-05-07 NOTE — Op Note (Signed)
[facility] [place], [id number]   ENDOSCOPY PROCEDURE REPORT  PATIENT: Donald, Rios  MR#: [id number] BIRTHDATE: 1940/04/29 , 72  yrs. old GENDER: Male ENDOSCOPIST: Gatha Mayer, MD, [facility] PROCEDURE DATE:  05/07/2013 PROCEDURE:  EGD w/ biopsy for H.pylori ASA CLASS:     Class III INDICATIONS:  Hematemesis. MEDICATIONS: Fentanyl 50 mcg IV and Versed 4 mg IV TOPICAL ANESTHETIC: Cetacaine Spray  DESCRIPTION OF PROCEDURE: After the risks benefits and alternatives of the procedure were thoroughly explained, informed consent was obtained.  The Pentax Gastroscope [id number] endoscope was introduced through the mouth and advanced to the second portion of the duodenum. Without limitations.  The instrument was slowly withdrawn as the mucosa was fully examined.        STOMACH: A medium sized (8-10 mm) non-bleeding round ulcer with surrounding edema and a pigmented changes w/o protuberances or visible vessels was seen in the cardia.  The remainder of the upper endoscopy exam was otherwise normal.  BX x 2 fropm antrum for CLOtest to look for H. pylori was performed. The scope was then withdrawn from the patient and the procedure completed.  COMPLICATIONS: There were no complications. ENDOSCOPIC IMPRESSION: 1.   Medium sized non-bleeding ulcer was found in the cardia - looks benign 2.   The remainder of the upper endoscopy exam was otherwise normal  RECOMMENDATIONS: 1.  PPI qam  - always 2.  Rx CLO if positive - I will check this - if negative then would do a serology 3. Home late tomorrow or 4/29 depending upon course 4.  Outpatient f/u Dr. Oretha Caprice in 4-6 weeks - will most likely need s repeat EGD to show healing 5. Am inclined to leave on 81 mg ASA for cardio-protective effects in him  eSigned:  Gatha Mayer, MD, [facility] 05/07/2013 4:47 PM

## 2013-05-07 NOTE — Consult Note (Signed)
[facility] Consult: 8:53 AM 05/07/2013  LOS: 1 day    Referring Provider: Dr Clearance Coots. Primary Care Physician:  Odette Fraction, MD Primary Gastroenterologist:  Dr. Ardis Hughs  Reason for Consultation:   Hematemesis   HPI: Donald Rios is a 73 y.o. male.  Past medical history of coronary artery disease, hiatal hernia, gout, type 2 DM (oral agents), hypertension, 45% EF, GI hx of hyperplastic polyp, reflux esophagitis and HH on studies in 2010.    Presented 4/26 with abdominal bloating/discomfort associated with sweating, weakness.  Only the discomfort improved after BM. Developed nausea, vomiting shortly post BM and from the start there was hematemesis.  Hgb has gone from 12.5 to 11.o since last PMs admission.  The BUN has gone from 32 to 45 since admission. Creatinine not elevated.   No recurrent hematemesis or nausea.   Had 2 brown BMs yesterday, before the vomiting.  One BM this AM was tarry.   Has not been significantly hypotensive but was tachycardic overnight (108/89 and rate 120 are the outliers)  Takes 81 ASA but no PPI. Said he never took a PPI, was not informed to do so.  Rare heartburn he treats prn with antacids.  No dysphagia, no early satiety.  No abd pain.  No bloody stools, no constipation or diarrhea.   Drinks about one beer per day a few times a week.   No NSAIDs     Past Medical History  Diagnosis Date  . Asthma   . 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 Atrial enlargement, left   . Hearing loss   . Diverticulosis   . ED (erectile dysfunction)   . Hiatal hernia 05/2008    EGD with HH and reflux esophagitis.   . Gout   . Diabetes mellitus   . GERD (gastroesophageal reflux disease)   . Hyperlipidemia   . Hypertension   . History of MI (myocardial infarction)   .  Ischemic cardiomyopathy     EF 45%, with inferior wall motion abnormality   . History of nuclear stress test 08/07/2010    dipyridamole; EKG negative for ischemia, low risk scan   . Valvular regurgitation     mitral and tricuspid (mild)  . Hyperplastic colon polyp 05/2008    Past Surgical History  Procedure Laterality Date  . Coronary artery bypass graft  2000    LIMA to LAD, free IMA to OM2, sequential graft to PLA & PLD  . Transthoracic echocardiogram  09/07/2010    EF [id number]; LV systolic function mildly reduced; LA mildly dilated; mild-mod MR & mild-mod TR; aortic root sclerosis/calcification;     Prior to Admission medications   Medication Sig Start Date End Date Taking? Authorizing Provider  allopurinol (ZYLOPRIM) 300 MG tablet Take 300 mg by mouth daily.   Yes Historical Provider, MD  amLODipine (NORVASC) 10 MG tablet Take 10 mg by mouth daily.   Yes Historical Provider, MD  aspirin 81 MG tablet Take 81 mg by mouth daily.   Yes Historical Provider, MD  benazepril (LOTENSIN)  40 MG tablet Take 40 mg by mouth daily.   Yes Historical Provider, MD  cloNIDine (CATAPRES) 0.2 MG tablet Take 0.2 mg by mouth 2 (two) times daily.   Yes Historical Provider, MD  metFORMIN (GLUCOPHAGE) 500 MG tablet Take 500 mg by mouth daily.   Yes Historical Provider, MD  metoprolol (LOPRESSOR) 50 MG tablet Take 75 mg by mouth 2 (two) times daily.   Yes Historical Provider, MD  simvastatin (ZOCOR) 40 MG tablet Take 40 mg by mouth daily.   Yes Historical Provider, MD    Scheduled Meds: . amLODipine  10 mg Oral Daily  . benazepril  40 mg Oral Daily  . cloNIDine  0.2 mg Oral BID  . insulin aspart  0-9 Units Subcutaneous TID WC  . metoprolol  75 mg Oral BID  . pantoprazole (PROTONIX) IV  40 mg Intravenous Q12H  . pantoprazole (PROTONIX) IV  40 mg Intravenous Once  . simvastatin  40 mg Oral q1800  . sodium chloride  3 mL Intravenous Q12H   Infusions: . sodium chloride 50 mL/hr at 05/07/13 0137   PRN  Meds: ondansetron (ZOFRAN) IV, ondansetron   Allergies as of 05/06/2013  . (No Known Allergies)    Family History  Problem Relation Age of Onset  . Stroke Father   . Stroke Maternal Grandmother   . Stroke Maternal Grandfather   . Hypertension Father   . Diabetes Brother     History   Social History  . Marital Status: Married    Spouse Name: N/A    Number of Children: N/A  . Years of Education: N/A   Occupational History  . 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     Social History Main Topics  . Smoking status: Former Smoker    Quit date: 11/04/1985  . Smokeless tobacco: Never Used  . Alcohol Use: Yes  . Drug Use: Yes  . Sexual Activity: Not on file     REVIEW OF SYSTEMS: Constitutional:  Stable weight. No exertional weakness.  Able to saw and hand chop wood without issue ENT:  No nose bleeds Pulm:  + cough, sputum is not purulent.  No DOE unless he runs for moderate distances. CV:  No palpitations, no LE edema. No chest pain.  Occasional dependent pedal/ankle edema.  GU:  No hematuria, no frequency, no nocturia GI:  Per HPI Heme:  easy bruising. But no significant uncontrolled bleeding. Transfusions:  None ever.  MS:  Says his gout was triggered by a diuretic.  No gout flares for > 2 years.  Neuro:  No headaches, no peripheral tingling or numbness Derm:  No itching, no rash or sores.  Endocrine:  No sweats or chills.  No polyuria or dysuria Immunization:  Flu and pneumovax up to date.  Travel:  None beyond local counties in last few months.    PHYSICAL EXAM: Vital signs in last 24 hours: Filed Vitals:   05/07/13 0639  BP: 138/72  Pulse: 100  Temp: 98.9 F (37.2 C)  Resp: 16   Wt Readings from Last 3 Encounters:  05/07/13 72.848 kg (160 lb 9.6 oz)  03/30/13 75.751 kg (167 lb)  02/22/13 74.39 kg (164 lb)   General: Healthy and comfortable WM who appears his age.   Head:  No asymmetry or swellilng  Eyes:  No icterus or  pallor Ears:  Slightly HOH  Nose:  No discharge or congestion Mouth:  Clear, moist.  Good dentition. Neck:  No mass or  JVD.  No TMG Lungs:  Clear, + cough.  No dyspnea Heart: RRR.  No MRG Abdomen:  Soft, ND, NT.  No bruits, mass, hernias, organomegaly.   Rectal: deferred   Musc/Skeltl: no joint swelling or deformity Extremities:  No CCE  Neurologic:  Oriented x 3.  Fully engaged and alert.  No tremor or limb weakness Skin:  No telangectasia, sores or rash Tattoos:  none Nodes:  No cervical adenopathy Heme:  A few purpura on forearms.    Psych:  Pleasant, relaxed, appropriate.      LAB RESULTS:  Recent Labs  05/06/13 1850 05/06/13 2207 05/07/13 0426  WBC 14.1* 10.4 7.4  HGB 12.5* 11.8* 11.0*  HCT 39.0 36.3* 33.6*  PLT 224 211 188   BMET Lab Results  Component Value Date   NA 142 05/07/2013   NA 143 05/06/2013   NA 141 03/26/2013   K 4.2 05/07/2013   K 4.7 05/06/2013   K 4.5 03/26/2013   CL 110 05/07/2013   CL 107 05/06/2013   CL 109 03/26/2013   CO2 22 05/07/2013   CO2 23 05/06/2013   CO2 26 03/26/2013   GLUCOSE 102* 05/07/2013   GLUCOSE 165* 05/06/2013   GLUCOSE 113* 03/26/2013   BUN 45* 05/07/2013   BUN 32* 05/06/2013   BUN 20 03/26/2013   CREATININE 0.92 05/07/2013   CREATININE 1.13 05/06/2013   CREATININE 1.04 03/26/2013   CALCIUM 8.8 05/07/2013   CALCIUM 9.2 05/06/2013   CALCIUM 9.3 03/26/2013   LFT  Recent Labs  05/06/13 1850 05/07/13 0426  PROT 6.2 5.4*  ALBUMIN 3.6 3.1*  AST 15 13  ALT 11 10  ALKPHOS 58 49  BILITOT 0.6 0.6   PT/INR Lab Results  Component Value Date   INR 1.05 05/06/2013   Lipase     Component Value Date/Time   LIPASE 46 05/06/2013 1850    RADIOLOGY STUDIES: Dg Abd Acute W/chest 05/07/2013  COMPARISON:  None.  FINDINGS: The lungs are well-aerated and clear. There is no evidence of focal opacification, pleural effusion or pneumothorax. The cardiomediastinal silhouette is normal in size. The patient is status post median sternotomy, with  evidence of prior CABG.  The visualized bowel gas pattern is unremarkable. Scattered stool and air are seen within the colon; there is no evidence of small bowel dilatation to suggest obstruction. No free intra-abdominal air is identified on the provided upright view. Stones are seen layering dependently within the gallbladder. 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  No acute osseous abnormalities are seen; the sacroiliac joints are unremarkable in appearance.  IMPRESSION: 1. Unremarkable bowel gas pattern; no free intra-abdominal air seen. 2. No acute cardiopulmonary process identified. 3. Cholelithiasis noted.   Electronically Signed   By: Garald Balding M.D.   On: 05/07/2013 00:50    ENDOSCOPIC STUDIES: 05/2008  EGD  Dr Ardis Hughs INDICATIONS: chronic GERD symptoms (takes TUMS periodically), brother and cousin had esophageal cancer ENDOSCOPIC IMPRESSION:  1) Hiatal hernia, small  2) Reflux appearing esophagitis, biopsied  3) Otherwise normal examination  RECOMMENDATIONS:  Erosive esophagitis, I doubt underlying neoplasia but biopsies were taken. Rather than intermittent TUMS, I recommend that he take daily or at least as needed over the counter prilosec (best taken 20-30 min prior to breakfast meal). Pathology: 2. ESOPHAGUS, BIOPSY: - GASTROESOPHAGEAL JUNCTION MUCOSA WITH MILD INFLAMMATION CONSISTENT WITH GASTROESOPHAGEAL REFLUX. - THERE IS NO EVIDENCE OF INTESTINAL METAPLASIA, DYSPLASIA, OR MALIGNANCY.   2010  Colonoscopy   Unable to find report in  Epic. Pt recalls diverticulosis.  But 05/2008 pathology report: 1. COLON, ASCENDING, BIOPSY: - HYPERPLASTIC POLYP. - THERE IS NO EVIDENCE OF MALIGNANCY.     IMPRESSION:   *  Single episode of hematemesis without significant anemia, or drop in Hgb. Rule out MW tear, r/o recurrent GERD or PUD.   *  Hx CAD.  CABG in 2000.  No sxs to suggest Angina or symptomatic heart failure. Troponin I normal x one.   *  Hx HP colon polyp and  diverticulosis.   *  Incidental gallstones on KUB. LFTs and Lipase normal.     PLAN:     *  Clears before noon.  EGD late this afternoon.  PO Protonix. Stop tele monitor.    Vena Rua  05/07/2013, 8:53 AM Pager: [phone]     [facility] Attending  I have also seen and assessed the patient and agree with the above note. EGD to investigate hematemesis.  The risks and benefits as well as alternatives of endoscopic procedure(s) have been discussed and reviewed. All questions answered. The patient agrees to proceed.  Gatha Mayer, MD, [facility] [phone] (pager) 05/07/2013 4:17 PM'

## 2013-05-07 NOTE — Progress Notes (Signed)
Pt heart rate up to 150s nonsustained when up to use bathroom.  Pt asymptomatic, heart rate now 95.  Will continue to monitor.

## 2013-05-07 NOTE — Care Management Note (Unsigned)
    Page 1 of 1   05/07/2013     3:02:02 PM CARE MANAGEMENT NOTE 05/07/2013  Patient:  Donald Rios, Donald Rios   Account Number:  [id number]  Date Initiated:  05/07/2013  Documentation initiated by:  GRAVES-BIGELOW,[employer]ion [employer]  Subjective/Objective Assessment:   Pt initiated for  Upper GI bleed; reports as bright red blood vomiting. Per MD plan GI consulted, EGD today and to Continue  PPI.     Action/Plan:   CM will continue to monitor for disposition needs.   Anticipated DC Date:  05/09/2013   Anticipated DC Plan:  [place]  CM consult      Choice offered to / List presented to:             Status of service:  In process, will continue to follow Medicare Important Message given?   (If response is "NO", the following Medicare IM given date fields will be blank) Date Medicare IM given:   Date Additional Medicare IM given:    Discharge Disposition:    Per UR Regulation:  Reviewed for med. necessity/level of care/duration of stay  If discussed at [place] of Stay Meetings, dates discussed:    Comments:

## 2013-05-07 NOTE — Progress Notes (Signed)
Patient ID: Donald Rios  male  CBJ:[id number]    DOB: April 26, 1940    DOA: 05/06/2013  PCP: Odette Fraction, MD  Assessment/Plan: Principal Problem:   Upper GI bleed; reports as bright red blood vomiting - GI consulted, EGD today - Continue IV PPI - CBC in a.m.   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 - No chest pain or cardiac symptoms, troponins negative, hold aspirin due to GI bleed - Otherwise continue amlodipine, benazepril, clonidine, beta blocker, statin    Gout - Continue allopurinol    DM2 (diabetes mellitus, type 2) - Continue sliding scale insulin    Hiatal hernia with GERD (gastroesophageal reflux disease) - Continue PPI    Hypertension: Stable   DVT Prophylaxis:SCDs  Code Status:Full code  Family Communication:Discussed with patient and his wife at the bedside  Disposition:Hopefully tomorrow stable  Consultants:  Gastroenterology  Procedures:  None  Antibiotics:  None    Subjective: No further bleeding since hospitalization, no nausea vomiting   Objective: Weight change:   Intake/Output Summary (Last 24 hours) at 05/07/13 1156 Last data filed at 05/07/13 0900  Gross per 24 hour  Intake    240 ml  Output    300 ml  Net    -60 ml   Blood pressure 143/61, pulse 100, temperature 98.9 F (37.2 C), temperature source Oral, resp. rate 16, height 5\' 8"  (1.727 m), weight 72.848 kg (160 lb 9.6 oz), SpO2 98.00%.  Physical Exam: General: Alert and awake, oriented x3, not in any acute distress. CVS: S1-S2 clear, no murmur rubs or gallops Chest: clear to auscultation bilaterally, no wheezing, rales or rhonchi Abdomen: soft nontender, nondistended, normal bowel sounds  Extremities: no cyanosis, clubbing or edema noted bilaterally Neuro: Cranial nerves II-XII intact, no focal neurological deficits  Lab Results: Basic Metabolic Panel:  Recent Labs Lab 05/06/13 1850 05/07/13 0426  NA 143 142  K 4.7 4.2  CL  107 110  CO2 23 22  GLUCOSE 165* 102*  BUN 32* 45*  CREATININE 1.13 0.92  CALCIUM 9.2 8.8   Liver Function Tests:  Recent Labs Lab 05/06/13 1850 05/07/13 0426  AST 15 13  ALT 11 10  ALKPHOS 58 49  BILITOT 0.6 0.6  PROT 6.2 5.4*  ALBUMIN 3.6 3.1*    Recent Labs Lab 05/06/13 1850  LIPASE 46   No results found for this basename: AMMONIA,  in the last 168 hours CBC:  Recent Labs Lab 05/06/13 2207 05/07/13 0426  WBC 10.4 7.4  NEUTROABS 8.5* 4.8  HGB 11.8* 11.0*  HCT 36.3* 33.6*  MCV 92.8 93.3  PLT 211 188   Cardiac Enzymes:  Recent Labs Lab 05/06/13 2207  TROPONINI <0.30   BNP: No components found with this basename: POCBNP,  CBG:  Recent Labs Lab 05/07/13 0112 05/07/13 0733 05/07/13 1141  GLUCAP 110* 116* 112*     Micro Results: No results found for this or any previous visit (from the past 240 hour(s)).  Studies/Results: Dg Abd Acute W/chest  05/07/2013   CLINICAL DATA:  Hematemesis.  History of asthma.  EXAM: ACUTE ABDOMEN SERIES (ABDOMEN 2 VIEW & CHEST 1 VIEW)  COMPARISON:  None.  FINDINGS: The lungs are well-aerated and clear. There is no evidence of focal opacification, pleural effusion or pneumothorax. The cardiomediastinal silhouette is normal in size. The patient is status post median sternotomy, with evidence of prior CABG.  The visualized bowel gas pattern is unremarkable. Scattered stool and air are seen  within the colon; there is no evidence of small bowel dilatation to suggest obstruction. No free intra-abdominal air is identified on the provided upright view. Stones are seen layering dependently within the gallbladder. 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  No acute osseous abnormalities are seen; the sacroiliac joints are unremarkable in appearance.  IMPRESSION: 1. Unremarkable bowel gas pattern; no free intra-abdominal air seen. 2. No acute cardiopulmonary process identified. 3. Cholelithiasis noted.   Electronically Signed    By: Garald Balding M.D.   On: 05/07/2013 00:50    Medications: Scheduled Meds: . amLODipine  10 mg Oral Daily  . benazepril  40 mg Oral Daily  . cloNIDine  0.2 mg Oral BID  . insulin aspart  0-9 Units Subcutaneous TID WC  . metoprolol  75 mg Oral BID  . pantoprazole  40 mg Oral Q0600  . simvastatin  40 mg Oral q1800  . sodium chloride  3 mL Intravenous Q12H      LOS: 1 day   Sofia Jaquith Krystal Eaton M.D. [facility] 05/07/2013, 11:56 AM Pager: [phone]  If 7PM-7AM, please contact night-coverage [email or web address] Password [email or web address]  **Disclaimer: This note was dictated with voice recognition software. Similar sounding words can inadvertently be transcribed and this note may contain transcription errors which may not have been corrected upon publication of note.**

## 2013-05-07 NOTE — Progress Notes (Signed)
UR Completed Liliana Brentlinger Graves-Bigelow, RN,BSN [phone]  

## 2013-05-08 ENCOUNTER — Encounter (HOSPITAL_COMMUNITY): Payer: Self-pay | Admitting: Internal Medicine

## 2013-05-08 DIAGNOSIS — K219 Gastro-esophageal reflux disease without esophagitis: Secondary | ICD-10-CM

## 2013-05-08 DIAGNOSIS — K250 Acute gastric ulcer with hemorrhage: Secondary | ICD-10-CM | POA: Diagnosis not present

## 2013-05-08 DIAGNOSIS — E119 Type 2 diabetes mellitus without complications: Secondary | ICD-10-CM | POA: Diagnosis not present

## 2013-05-08 DIAGNOSIS — K920 Hematemesis: Secondary | ICD-10-CM | POA: Diagnosis not present

## 2013-05-08 DIAGNOSIS — I25810 Atherosclerosis of coronary artery bypass graft(s) without angina pectoris: Secondary | ICD-10-CM | POA: Diagnosis not present

## 2013-05-08 LAB — GLUCOSE, CAPILLARY: GLUCOSE-CAPILLARY: 125 mg/dL — AB (ref 70–99)

## 2013-05-08 LAB — CBC
HCT: 32 % — ABNORMAL LOW (ref 39.0–52.0)
HEMOGLOBIN: 10.3 g/dL — AB (ref 13.0–17.0)
MCH: 30.2 pg (ref 26.0–34.0)
MCHC: 32.2 g/dL (ref 30.0–36.0)
MCV: 93.8 fL (ref 78.0–100.0)
Platelets: 199 10*3/uL (ref 150–400)
RBC: 3.41 MIL/uL — ABNORMAL LOW (ref 4.22–5.81)
RDW: 14.2 % (ref 11.5–15.5)
WBC: 10.2 10*3/uL (ref 4.0–10.5)

## 2013-05-08 LAB — CLOTEST (H. PYLORI), BIOPSY: HELICOBACTER SCREEN: NEGATIVE

## 2013-05-08 MED ORDER — ATORVASTATIN CALCIUM 20 MG PO TABS
20.0000 mg | ORAL_TABLET | Freq: Every day | ORAL | Status: DC
  Filled 2013-05-08: qty 1

## 2013-05-08 MED ORDER — CLONIDINE HCL 0.2 MG PO TABS
0.2000 mg | ORAL_TABLET | Freq: Two times a day (BID) | ORAL | Status: DC
  Administered 2013-05-08: 0.2 mg via ORAL
  Filled 2013-05-08 (×2): qty 1

## 2013-05-08 MED ORDER — PANTOPRAZOLE SODIUM 40 MG PO TBEC: 40.0000 mg | DELAYED_RELEASE_TABLET | Freq: Every day | ORAL | Status: DC

## 2013-05-08 MED ORDER — AMLODIPINE BESYLATE 10 MG PO TABS
10.0000 mg | ORAL_TABLET | Freq: Every day | ORAL | Status: DC
  Administered 2013-05-08: 10 mg via ORAL
  Filled 2013-05-08: qty 1

## 2013-05-08 NOTE — Discharge Instructions (Signed)
Peptic Ulcer A peptic ulcer is a sore in the lining of in your esophagus (esophageal ulcer), stomach (gastric ulcer), or in the first part of your small intestine (duodenal ulcer). The ulcer causes erosion into the deeper tissue. CAUSES  Normally, the lining of the stomach and the small intestine protects itself from the acid that digests food. The protective lining can be damaged by:  An infection caused by a bacterium called Helicobacter pylori (H. pylori).  Regular use of nonsteroidal anti-inflammatory drugs (NSAIDs), such as ibuprofen or aspirin.  Smoking tobacco. Other risk factors include being older than 50, drinking alcohol excessively, and having a family history of ulcer disease.  SYMPTOMS   Burning pain or gnawing in the area between the chest and the belly button.  Heartburn.  Nausea and vomiting.  Bloating. The pain can be worse on an empty stomach and at night. If the ulcer results in bleeding, it can cause:  Black, tarry stools.  Vomiting of bright red blood.  Vomiting of coffee ground looking materials. DIAGNOSIS  A diagnosis is usually made based upon your history and an exam. Other tests and procedures may be performed to find the cause of the ulcer. Finding a cause will help determine the best treatment. Tests and procedures may include:  Blood tests, stool tests, or breath tests to check for the bacterium H. pylori.  An upper gastrointestinal (GI) series of the esophagus, stomach, and small intestine.  An endoscopy to examine the esophagus, stomach, and small intestine.  A biopsy. TREATMENT  Treatment may include:  Eliminating the cause of the ulcer, such as smoking, NSAIDs, or alcohol.  Medicines to reduce the amount of acid in your digestive tract.  Antibiotic medicines if the ulcer is caused by the H. pylori bacterium.  An upper endoscopy to treat a bleeding ulcer.  Surgery if the bleeding is severe or if the ulcer created a hole somewhere in the  digestive system. HOME CARE INSTRUCTIONS   Avoid tobacco, alcohol, and caffeine. Smoking can increase the acid in the stomach, and continued smoking will impair the healing of ulcers.  Avoid foods and drinks that seem to cause discomfort or aggravate your ulcer.  Only take medicines as directed by your caregiver. Do not substitute over-the-counter medicines for prescription medicines without talking to your caregiver.  Keep any follow-up appointments and tests as directed. SEEK MEDICAL CARE IF:   Your do not improve within 7 days of starting treatment.  You have ongoing indigestion or heartburn. SEEK IMMEDIATE MEDICAL CARE IF:   You have sudden, sharp, or persistent abdominal pain.  You have bloody or dark black, tarry stools.  You vomit blood or vomit that looks like coffee grounds.  You become light headed, weak, or feel faint.  You become sweaty or clammy. MAKE SURE YOU:   Understand these instructions.  Will watch your condition.  Will get help right away if you are not doing well or get worse. Document Released: 12/26/1999 Document Revised: 09/22/2011 Document Reviewed: 07/28/2011 [facility] Patient Information 2014 [place], [place].   Diets for Diabetes, Food Labeling Look at food labels to help you decide how much of a product you can eat. You will want to check the amount of total carbohydrate in a serving to see how the food fits into your meal plan. In the list of ingredients, the ingredient present in the largest amount by weight must be listed first, followed by the other ingredients in descending order. STANDARD OF IDENTITY Most products have a  list of ingredients. However, foods that the Food and Drug Administration (FDA) has given a standard of identity do not need a list of ingredients. A standard of identity means that a food must contain certain ingredients if it is called a particular name. Examples are mayonnaise, peanut butter, ketchup, jelly, and  cheese. LABELING TERMS There are many terms found on food labels. Some of these terms have specific definitions. Some terms are regulated by the FDA, and the FDA has clearly specified how they can be used. Others are not regulated or well-defined and can be misleading and confusing. SPECIFICALLY DEFINED TERMS Nutritive Sweetener.  A sweetener that contains calories,such as table sugar or honey. Nonnutritive Sweetener.  A sweetener with few or no calories,such as saccharin, aspartame, sucralose, and cyclamate. LABELING TERMS REGULATED BY THE FDA Free.  The product contains only a tiny or small amount of fat, cholesterol, sodium, sugar, or calories. For example, a "fat-free" product will contain less than 0.5 g of fat per serving. Low.  A food described as "low" in fat, saturated fat, cholesterol, sodium, or calories could be eaten fairly often without exceeding dietary guidelines. For example, "low in fat" means no more than 3 g of fat per serving. Lean.  "Lean" and "extra lean" are U.S. Department of Agriculture Architect(USDA) terms for use on meat and poultry products. "Lean" means the product contains less than 10 g of fat, 4 g of saturated fat, and 95 mg of cholesterol per serving. "Lean" is not as low in fat as a product labeled "low." Extra Lean.  "Extra lean" means the product contains less than 5 g of fat, 2 g of saturated fat, and 95 mg of cholesterol per serving. While "extra lean" has less fat than "lean," it is still higher in fat than a product labeled "low." Reduced, Less, Fewer.  A diet product that contains 25% less of a nutrient or calories than the regular version. For example, hot dogs might be labeled "25% less fat than our regular hot dogs." Light/Lite.  A diet product that contains  fewer calories or  the fat of the original. For example, "light in sodium" means a product with  the usual sodium. More.  One serving contains at least 10% more of the daily value of a  vitamin, mineral, or fiber than usual. Good Source Of.  One serving contains 10% to 19% of the daily value for a particular vitamin, mineral, or fiber. Excellent Source Of.  One serving contains 20% or more of the daily value for a particular nutrient. Other terms used might be "high in" or "rich in." Enriched or Fortified.  The product contains added vitamins, minerals, or protein. Nutrition labeling must be used on enriched or fortified foods. Imitation.  The product has been altered so that it is lower in protein, vitamins, or minerals than the usual food,such as imitation peanut butter. Total Fat.  The number listed is the total of all fat found in a serving of the product. Under total fat, food labels must list saturated fat and trans fat, which are associated with raising bad cholesterol and an increased risk of heart blood vessel disease. Saturated Fat.  Mainly fats from animal-based sources. Some examples are red meat, cheese, cream, whole milk, and coconut oil. Trans Fat.  Found in some fried snack foods, packaged foods, and fried restaurant foods. It is recommended you eat as close to 0 g of trans fat as possible, since it raises bad cholesterol and lowers good  cholesterol. Polyunsaturated and Monounsaturated Fats.  More healthful fats. These fats are from plant sources. Total Carbohydrate.  The number of carbohydrate grams in a serving of the product. Under total carbohydrate are listed the other carbohydrate sources, such as dietary fiber and sugars. Dietary Fiber.  A carbohydrate from plant sources. Sugars.  Sugars listed on the label contain all naturally occurring sugars as well as added sugars. LABELING TERMS NOT REGULATED BY THE FDA Sugarless.  Table sugar (sucrose) has not been added. However, the manufacturer may use another form of sugar in place of sucrose to sweeten the product. For example, sugar alcohols are used to sweeten foods. Sugar alcohols are a form  of sugar but are not table sugar. If a product contains sugar alcohols in place of sucrose, it can still be labeled "sugarless." Low Salt, Salt-Free, Unsalted, No Salt, No Salt Added, Without Added Salt.  Food that is usually processed with salt has been made without salt. However, the food may contain sodium-containing additives, such as preservatives, leavening agents, or flavorings. Natural.  This term has no legal meaning. Organic.  Foods that are certified as organic have been inspected and approved by the USDA to ensure they are produced without pesticides, fertilizers containing synthetic ingredients, bioengineering, or ionizing radiation. Document Released: 12/31/2002 Document Revised: 03/22/2011 Document Reviewed: 07/18/2008 [facility] Patient Information 2014 [place], [place].   Diabetes Meal Planning Guide The diabetes meal planning guide is a tool to help you plan your meals and snacks. It is important for people with diabetes to manage their blood glucose (sugar) levels. Choosing the right foods and the right amounts throughout your day will help control your blood glucose. Eating right can even help you improve your blood pressure and reach or maintain a healthy weight. CARBOHYDRATE COUNTING MADE EASY When you eat carbohydrates, they turn to sugar. This raises your blood glucose level. Counting carbohydrates can help you control this level so you feel better. When you plan your meals by counting carbohydrates, you can have more flexibility in what you eat and balance your medicine with your food intake. Carbohydrate counting simply means adding up the total amount of carbohydrate grams in your meals and snacks. Try to eat about the same amount at each meal. Foods with carbohydrates are listed below. Each portion below is 1 carbohydrate serving or 15 grams of carbohydrates. Ask your dietician how many grams of carbohydrates you should eat at each meal or snack. Grains and Starches  1  slice bread.   English muffin or hotdog/hamburger bun.   cup cold cereal (unsweetened).   cup cooked pasta or rice.   cup starchy vegetables (corn, potatoes, peas, beans, winter squash).  1 tortilla (6 inches).   bagel.  1 waffle or pancake (size of a CD).   cup cooked cereal.  4 to 6 small crackers. *Whole grain is recommended. Fruit  1 cup fresh unsweetened berries, melon, papaya, pineapple.  1 small fresh fruit.   banana or mango.   cup fruit juice (4 oz unsweetened).   cup canned fruit in natural juice or water.  2 tbs dried fruit.  12 to 15 grapes or cherries. Milk and Yogurt  1 cup fat-free or 1% milk.  1 cup soy milk.  6 oz light yogurt with sugar-free sweetener.  6 oz low-fat soy yogurt.  6 oz plain yogurt. Vegetables  1 cup raw or  cup cooked is counted as 0 carbohydrates or a "free" food.  If you eat 3 or more servings  at 1 meal, count them as 1 carbohydrate serving. Other Carbohydrates   oz chips or pretzels.   cup ice cream or frozen yogurt.   cup sherbet or sorbet.  2 inch square cake, no frosting.  1 tbs honey, sugar, jam, jelly, or syrup.  2 small cookies.  3 squares of graham crackers.  3 cups popcorn.  6 crackers.  1 cup broth-based soup.  Count 1 cup casserole or other mixed foods as 2 carbohydrate servings.  Foods with less than 20 calories in a serving may be counted as 0 carbohydrates or a "free" food. You may want to purchase a book or computer software that lists the carbohydrate gram counts of different foods. In addition, the nutrition facts panel on the labels of the foods you eat are a good source of this information. The label will tell you how big the serving size is and the total number of carbohydrate grams you will be eating per serving. Divide this number by 15 to obtain the number of carbohydrate servings in a portion. Remember, 1 carbohydrate serving equals 15 grams of carbohydrate. SERVING  SIZES Measuring foods and serving sizes helps you make sure you are getting the right amount of food. The list below tells how big or small some common serving sizes are.  1 oz.........4 stacked dice.  3 oz........Marland KitchenDeck of cards.  1 tsp.......Marland KitchenTip of little finger.  1 tbs......Marland KitchenMarland KitchenThumb.  2 tbs.......Marland KitchenGolf ball.   cup......Marland KitchenHalf of a fist.  1 cup.......Marland KitchenA fist. SAMPLE DIABETES MEAL PLAN Below is a sample meal plan that includes foods from the grain and starches, dairy, vegetable, fruit, and meat groups. A dietician can individualize a meal plan to fit your calorie needs and tell you the number of servings needed from each food group. However, controlling the total amount of carbohydrates in your meal or snack is more important than making sure you include all of the food groups at every meal. You may interchange carbohydrate containing foods (dairy, starches, and fruits). The meal plan below is an example of a 2000 calorie diet using carbohydrate counting. This meal plan has 17 carbohydrate servings. Breakfast  1 cup oatmeal (2 carb servings).   cup light yogurt (1 carb serving).  1 cup blueberries (1 carb serving).   cup almonds. Snack  1 large apple (2 carb servings).  1 low-fat string cheese stick. Lunch  Chicken breast salad.  1 cup spinach.   cup chopped tomatoes.  2 oz chicken breast, sliced.  2 tbs low-fat [place] dressing.  12 whole-wheat crackers (2 carb servings).  12 to 15 grapes (1 carb serving).  1 cup low-fat milk (1 carb serving). Snack  1 cup carrots.   cup hummus (1 carb serving). Dinner  3 oz broiled salmon.  1 cup brown rice (3 carb servings). Snack  1  cups steamed broccoli (1 carb serving) drizzled with 1 tsp olive oil and lemon juice.  1 cup light pudding (2 carb servings). DIABETES MEAL PLANNING WORKSHEET Your dietician can use this worksheet to help you decide how many servings of foods and what types of foods are right for  you.  BREAKFAST Food Group and Servings / Carb Servings Grain/Starches __________________________________ Dairy __________________________________________ Vegetable ______________________________________ Fruit ___________________________________________ Meat __________________________________________ Fat ____________________________________________ LUNCH Food Group and Servings / Carb Servings Grain/Starches ___________________________________ Dairy ___________________________________________ Fruit ____________________________________________ Meat ___________________________________________ Fat _____________________________________________ Wonda [employer] Food Group and Servings / Carb Servings Grain/Starches ___________________________________ Dairy ___________________________________________ Fruit ____________________________________________ Meat ___________________________________________ Fat _____________________________________________ SNACKS Food Group and Servings / Carb  Servings Grain/Starches ___________________________________ Dairy ___________________________________________ Vegetable _______________________________________ Fruit ____________________________________________ Meat ___________________________________________ Fat _____________________________________________ DAILY TOTALS Starches _________________________ Vegetable ________________________ Fruit ____________________________ Dairy ____________________________ Meat ____________________________ Fat ______________________________ Document Released: 09/24/2004 Document Revised: 03/22/2011 Document Reviewed: 08/05/2008 ExitCare Patient Information 2014 [place], [place].

## 2013-05-08 NOTE — Progress Notes (Signed)
Daily Rounding Note  05/08/2013, 9:12 AM  LOS: 2 days   SUBJECTIVE:       No further nausea/vomiting.  Brown stool.  Tolerating solids.  No complaints  OBJECTIVE:         Vital signs in last 24 hours:    Temp:  [97.6 F (36.4 C)-98.8 F (37.1 C)] 98.2 F (36.8 C) (04/28 0500) Pulse Rate:  [63-82] 70 (04/28 0500) Resp:  [14-19] 16 (04/28 0500) BP: (97-175)/(30-88) 97/30 mmHg (04/28 0500) SpO2:  [92 %-100 %] 98 % (04/28 0500)   General: looks well, NAD   Heart: RRR Chest: clear bil Abdomen: soft, NT, ND.  Active BS  Extremities: no CCE Neuro/Psych:  Pleasant, no gross deficits.  Oriented x 3.   Intake/Output from previous day: 04/27 0701 - 04/28 0700 In: 720 [P.O.:720] Out: 500 [Urine:500]  Intake/Output this shift:    Lab Results:  Recent Labs  05/06/13 2207 05/07/13 0426 05/08/13 0544  WBC 10.4 7.4 10.2  HGB 11.8* 11.0* 10.3*  HCT 36.3* 33.6* 32.0*  PLT 211 188 199   BMET  Recent Labs  05/06/13 1850 05/07/13 0426  NA 143 142  K 4.7 4.2  CL 107 110  CO2 23 22  GLUCOSE 165* 102*  BUN 32* 45*  CREATININE 1.13 0.92  CALCIUM 9.2 8.8   LFT  Recent Labs  05/06/13 1850 05/07/13 0426  PROT 6.2 5.4*  ALBUMIN 3.6 3.1*  AST 15 13  ALT 11 10  ALKPHOS 58 49  BILITOT 0.6 0.6   PT/INR  Recent Labs  05/06/13 2323  LABPROT 13.5  INR 1.05    Studies/Results: Dg Abd Acute W/chest  05/07/2013   CLINICAL DATA:  Hematemesis.  History of asthma.  EXAM: ACUTE ABDOMEN SERIES (ABDOMEN 2 VIEW & CHEST 1 VIEW)  COMPARISON:  None.  FINDINGS: The lungs are well-aerated and clear. There is no evidence of focal opacification, pleural effusion or pneumothorax. The cardiomediastinal silhouette is normal in size. The patient is status post median sternotomy, with evidence of prior CABG.  The visualized bowel gas pattern is unremarkable. Scattered stool and air are seen within the colon; there is no evidence  of small bowel dilatation to suggest obstruction. No free intra-abdominal air is identified on the provided upright view. Stones are seen layering dependently within the gallbladder. 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  No acute osseous abnormalities are seen; the sacroiliac joints are unremarkable in appearance.  IMPRESSION: 1. Unremarkable bowel gas pattern; no free intra-abdominal air seen. 2. No acute cardiopulmonary process identified. 3. Cholelithiasis noted.   Electronically Signed   By: Garald Balding M.D.   On: 05/07/2013 00:50    ASSESMENT:   * Single episode of hematemesis without significant anemia, or drop in Hgb.  2010 EGD with GERD, HH. Not on PPI at home. EGD 05/07/13: benign appearing, non-bleeding ulcer in gastric cardia.  Now on daily PPI.   * Hx CAD. CABG in 2000. No sxs to suggest Angina or symptomatic heart failure. Troponin I normal x one.  * Hx HP colon polyp and diverticulosis.  * Incidental gallstones on KUB. LFTs and Lipase normal.     PLAN   *  Has ROV with Dr Ardis Hughs at GI for 6/16 at 1:45 PM.  *  Daily PPI.  Will add abx if clo/H pylori is +.  This d/w pt.  *  Ok to take 52 ASA daily.   *  Will sign off, ok to discharge.    Charlynne Cousins Jian Hodgman  05/08/2013, 9:12 AM Pager: [phone]

## 2013-05-08 NOTE — Discharge Summary (Signed)
Physician Discharge Summary  Patient ID: DEVRIN MONFORTE MRN: [id number] DOB/AGE: January 05, 1941 73 y.o.  Admit date: 05/06/2013 Discharge date: 05/08/2013  Primary Care Physician:  Jenna Luo TOM, MD  Discharge Diagnoses:    . Upper GI bleed due to acute gastric ulcer  . Acute gastric ulcer with hemorrhage . 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 Hiatal hernia . DM2 (diabetes mellitus, type 2) . Gout . GERD (gastroesophageal reflux disease) . Hypertension  Consults: Gastroenterology, Dr. Carlean Purl  Recommendations for Outpatient Follow-up:  Patient was recommended to continue daily PPI, patient will be called by gastroenterology for biopsy results.  Allergies:  No Known Allergies   Discharge Medications:   Medication List         allopurinol 300 MG tablet  Commonly known as:  ZYLOPRIM  Take 300 mg by mouth daily.     amLODipine 10 MG tablet  Commonly known as:  NORVASC  Take 10 mg by mouth daily.     aspirin 81 MG tablet  Take 81 mg by mouth daily.     benazepril 40 MG tablet  Commonly known as:  LOTENSIN  Take 40 mg by mouth daily.     cloNIDine 0.2 MG tablet  Commonly known as:  CATAPRES  Take 0.2 mg by mouth 2 (two) times daily.     metFORMIN 500 MG tablet  Commonly known as:  GLUCOPHAGE  Take 500 mg by mouth daily.     metoprolol 50 MG tablet  Commonly known as:  LOPRESSOR  Take 75 mg by mouth 2 (two) times daily.     pantoprazole 40 MG tablet  Commonly known as:  PROTONIX  Take 1 tablet (40 mg total) by mouth daily.     simvastatin 40 MG tablet  Commonly known as:  ZOCOR  Take 40 mg by mouth daily.         Brief H and P: For complete details please refer to admission H and P, but in briefJames W Valone is a 73 y.o. male with Past medical history of coronary artery disease, hiatal hernia, gout, diabetes, hypertension, 45% EF,  The patient presented with the complaint of bloody vomiting. He mentioned that he was at his baseline during  the day. In the afternoon at around 5 PM he started having complaint of abdominal discomfort with a sense that his abdomen is bloating with gas. He was diaphoretic and weak with that. He had a bowel movement after which she felt better in his abdomen, but continues to remain weak and diaphoretic. After a few minutes of that he had a sudden sense of nausea which was followed by an episode of vomiting, he found that there was bright red blood in his vomiting. After the vomiting he felt better but due to his bed red blood he presented to the ED.   Hospital Course:  Upper GI bleed with bright red blood vomiting due to acute gastric ulcer with hemorrhage: Patient was admitted and based on IV Protonix, gastroenterology was consulted. Stool occult test was negative. Patient was seen by Dr. Carlean Purl, who recommended EGD. EGD showed medium sized non-in ulcer, found in cardia, looks benign otherwise normal. Patient was recommended daily PPI indefinitely. Pathology/CLO test is still pending which will be followed up by gastroenterology. He has appointment with Dr. Ardis Hughs in May.    DM2 (diabetes mellitus, type 2)- remained stable    GERD (gastroesophageal reflux disease) continue PPI indefinitely    Hypertension: Continue oral antihypertensives.   Day of  Discharge BP 126/60  Pulse 81  Temp(Src) 98.2 F (36.8 C) (Oral)  Resp 16  Ht 5\' 8"  (1.727 m)  Wt 72.848 kg (160 lb 9.6 oz)  BMI 24.42 kg/m2  SpO2 98%  Physical Exam: General: Alert and awake oriented x3 not in any acute distress. HEENT: anicteric sclera, pupils reactive to light and accommodation CVS: S1-S2 clear no murmur rubs or gallops Chest: clear to auscultation bilaterally, no wheezing rales or rhonchi Abdomen: soft nontender, nondistended, normal bowel sounds Extremities: no cyanosis, clubbing or edema noted bilaterally Neuro: Cranial nerves II-XII intact, no focal neurological deficits   The results of significant diagnostics from this  hospitalization (including imaging, microbiology, ancillary and laboratory) are listed below for reference.    LAB RESULTS: Basic Metabolic Panel:  Recent Labs Lab 05/06/13 1850 05/07/13 0426  NA 143 142  K 4.7 4.2  CL 107 110  CO2 23 22  GLUCOSE 165* 102*  BUN 32* 45*  CREATININE 1.13 0.92  CALCIUM 9.2 8.8   Liver Function Tests:  Recent Labs Lab 05/06/13 1850 05/07/13 0426  AST 15 13  ALT 11 10  ALKPHOS 58 49  BILITOT 0.6 0.6  PROT 6.2 5.4*  ALBUMIN 3.6 3.1*    Recent Labs Lab 05/06/13 1850  LIPASE 46   No results found for this basename: AMMONIA,  in the last 168 hours CBC:  Recent Labs Lab 05/07/13 0426 05/08/13 0544  WBC 7.4 10.2  NEUTROABS 4.8  --   HGB 11.0* 10.3*  HCT 33.6* 32.0*  MCV 93.3 93.8  PLT 188 199   Cardiac Enzymes:  Recent Labs Lab 05/06/13 2207  TROPONINI <0.30   BNP: No components found with this basename: POCBNP,  CBG:  Recent Labs Lab 05/07/13 2011 05/08/13 0729  GLUCAP 142* 125*    Significant Diagnostic Studies:  Dg Abd Acute W/chest  05/07/2013   CLINICAL DATA:  Hematemesis.  History of asthma.  EXAM: ACUTE ABDOMEN SERIES (ABDOMEN 2 VIEW & CHEST 1 VIEW)  COMPARISON:  None.  FINDINGS: The lungs are well-aerated and clear. There is no evidence of focal opacification, pleural effusion or pneumothorax. The cardiomediastinal silhouette is normal in size. The patient is status post median sternotomy, with evidence of prior CABG.  The visualized bowel gas pattern is unremarkable. Scattered stool and air are seen within the colon; there is no evidence of small bowel dilatation to suggest obstruction. No free intra-abdominal air is identified on the provided upright view. Stones are seen layering dependently within the gallbladder. 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  No acute osseous abnormalities are seen; the sacroiliac joints are unremarkable in appearance.  IMPRESSION: 1. Unremarkable bowel gas pattern; no  free intra-abdominal air seen. 2. No acute cardiopulmonary process identified. 3. Cholelithiasis noted.   Electronically Signed   By: Garald Balding M.D.   On: 05/07/2013 00:50       Disposition and Follow-up:     Discharge Orders   Future Appointments Provider Department Dept Phone   06/26/2013 1:45 PM Milus Banister, MD [facility] [phone]   10/01/2013 8:00 AM Susy Frizzle, MD [facility] [phone]   Future Orders Complete By Expires   Diet Carb Modified  As directed    Discharge instructions  As directed    Increase activity slowly  As directed        DISPOSITION: Home  DIET: Carb modified  DISCHARGE FOLLOW-UP Follow-up Information   Follow up with Milus Banister, MD On  06/26/2013. ( at 1:45PM for hospital follow-up)    Specialty:  Gastroenterology   Contact information:   [address] [place] [address] [phone]       Follow up with [facility] TOM, MD. Schedule an appointment as soon as possible for a visit in 2 weeks. (for hospital follow-up)    Specialty:  Family Medicine   Contact information:   [address] [place] [address] [phone]       Time spent on Discharge: 40 Minutes  Signed:   Mendel Corning M.D. [facility] 05/08/2013, 11:43 AM Pager: [phone]   **Disclaimer: This note was dictated with voice recognition software. Similar sounding words can inadvertently be transcribed and this note may contain transcription errors which may not have been corrected upon publication of note.**

## 2013-05-09 ENCOUNTER — Encounter: Payer: Self-pay | Admitting: Internal Medicine

## 2013-05-09 NOTE — Progress Notes (Signed)
Agree w/ Donald Rios 

## 2013-05-14 DIAGNOSIS — L8990 Pressure ulcer of unspecified site, unspecified stage: Secondary | ICD-10-CM | POA: Diagnosis not present

## 2013-05-18 ENCOUNTER — Encounter: Payer: Self-pay | Admitting: Family Medicine

## 2013-05-18 ENCOUNTER — Ambulatory Visit (INDEPENDENT_AMBULATORY_CARE_PROVIDER_SITE_OTHER): Payer: Medicare Other | Admitting: Family Medicine

## 2013-05-18 VITALS — BP 118/68 | HR 60 | Temp 97.6°F | Resp 18 | Wt 168.0 lb

## 2013-05-18 DIAGNOSIS — Z09 Encounter for follow-up examination after completed treatment for conditions other than malignant neoplasm: Secondary | ICD-10-CM

## 2013-05-18 DIAGNOSIS — I2510 Atherosclerotic heart disease of native coronary artery without angina pectoris: Secondary | ICD-10-CM

## 2013-05-18 NOTE — Progress Notes (Signed)
Subjective:    Patient ID: Donald Rios, male    DOB: [birth year], 73 y.o.   MRN: [id number]  HPI Patient was recently admitted to the hospital with an upper GI bleed. I have included his discharge summary below for my reference: Admit date: 05/06/2013  Discharge date: 05/08/2013  Primary Care Physician: Jenna Luo TOM, MD  Discharge Diagnoses:  . Upper GI bleed due to acute gastric ulcer  . Acute gastric ulcer with hemorrhage . 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 Hiatal hernia . DM2 (diabetes mellitus, type 2) . Gout . GERD (gastroesophageal reflux disease) . Hypertension  Consults: Gastroenterology, Dr. Carlean Purl  Recommendations for Outpatient Follow-up:  Patient was recommended to continue daily PPI, patient will be called by gastroenterology for biopsy results.  Allergies:  No Known Allergies  Discharge Medications:    Medication List         allopurinol 300 MG tablet    Commonly known as: ZYLOPRIM    Take 300 mg by mouth daily.    amLODipine 10 MG tablet    Commonly known as: NORVASC    Take 10 mg by mouth daily.    aspirin 81 MG tablet    Take 81 mg by mouth daily.    benazepril 40 MG tablet    Commonly known as: LOTENSIN    Take 40 mg by mouth daily.    cloNIDine 0.2 MG tablet    Commonly known as: CATAPRES    Take 0.2 mg by mouth 2 (two) times daily.    metFORMIN 500 MG tablet    Commonly known as: GLUCOPHAGE    Take 500 mg by mouth daily.    metoprolol 50 MG tablet    Commonly known as: LOPRESSOR    Take 75 mg by mouth 2 (two) times daily.    pantoprazole 40 MG tablet    Commonly known as: PROTONIX    Take 1 tablet (40 mg total) by mouth daily.    simvastatin 40 MG tablet    Commonly known as: ZOCOR    Take 40 mg by mouth daily.     Brief H and P:  For complete details please refer to admission H and P, but in briefJames W Rios is a 73 y.o. male with Past medical history of coronary artery disease, hiatal hernia, gout, diabetes,  hypertension, 45% EF,  The patient presented with the complaint of bloody vomiting. He mentioned that he was at his baseline during the day. In the afternoon at around 5 PM he started having complaint of abdominal discomfort with a sense that his abdomen is bloating with gas. He was diaphoretic and weak with that. He had a bowel movement after which she felt better in his abdomen, but continues to remain weak and diaphoretic. After a few minutes of that he had a sudden sense of nausea which was followed by an episode of vomiting, he found that there was bright red blood in his vomiting. After the vomiting he felt better but due to his bed red blood he presented to the ED.  Hospital Course:  Upper GI bleed with bright red blood vomiting due to acute gastric ulcer with hemorrhage: Patient was admitted and based on IV Protonix, gastroenterology was consulted. Stool occult test was negative. Patient was seen by Dr. Carlean Purl, who recommended EGD. EGD showed medium sized non-in ulcer, found in cardia, looks benign otherwise normal. Patient was recommended daily PPI indefinitely. Pathology/CLO test is still pending which will be followed  up by gastroenterology. He has appointment with Dr. Ardis Hughs in May.  DM2 (diabetes mellitus, type 2)- remained stable  GERD (gastroesophageal reflux disease) continue PPI indefinitely  Hypertension: Continue oral antihypertensives.  Day of Discharge  BP 126/60  Pulse 81  Temp(Src) 98.2 F (36.8 C) (Oral)  Resp 16  Ht 5\' 8"  (1.727 m)  Wt 72.848 kg (160 lb 9.6 oz)  BMI 24.42 kg/m2  SpO2 98%  Physical Exam:  General: Alert and awake oriented x3 not in any acute distress.  HEENT: anicteric sclera, pupils reactive to light and accommodation  CVS: S1-S2 clear no murmur rubs or gallops  Chest: clear to auscultation bilaterally, no wheezing rales or rhonchi  Abdomen: soft nontender, nondistended, normal bowel sounds  Extremities: no cyanosis, clubbing or edema noted  bilaterally  Neuro: Cranial nerves II-XII intact, no focal neurological deficits  The results of significant diagnostics from this hospitalization (including imaging, microbiology, ancillary and laboratory) are listed below for reference.   LAB RESULTS:  Basic Metabolic Panel:  Recent Labs  Lab  05/06/13 1850  05/07/13 0426   NA  143  142   K  4.7  4.2   CL  107  110   CO2  23  22   GLUCOSE  165*  102*   BUN  32*  45*   CREATININE  1.13  0.92   CALCIUM  9.2  8.8   Liver Function Tests:  Recent Labs  Lab  05/06/13 1850  05/07/13 0426   AST  15  13   ALT  11  10   ALKPHOS  58  49   BILITOT  0.6  0.6   PROT  6.2  5.4*   ALBUMIN  3.6  3.1*   Recent Labs  Lab  05/06/13 1850   LIPASE  46   No results found for this basename: AMMONIA, in the last 168 hours  CBC:  Recent Labs  Lab  05/07/13 0426  05/08/13 0544   WBC  7.4  10.2   NEUTROABS  4.8  --   HGB  11.0*  10.3*   HCT  33.6*  32.0*   MCV  93.3  93.8   PLT  188  199   Cardiac Enzymes:  Recent Labs  Lab  05/06/13 2207   TROPONINI  <0.30   BNP:  No components found with this basename: POCBNP,  CBG:  Recent Labs  Lab  05/07/13 2011  05/08/13 0729   GLUCAP  142*  125*   Significant Diagnostic Studies:  Dg Abd Acute W/chest  05/07/2013 CLINICAL DATA: Hematemesis. History of asthma. EXAM: ACUTE ABDOMEN SERIES (ABDOMEN 2 VIEW & CHEST 1 VIEW) COMPARISON: None. FINDINGS: The lungs are well-aerated and clear. There is no evidence of focal opacification, pleural effusion or pneumothorax. The cardiomediastinal silhouette is normal in size. The patient is status post median sternotomy, with evidence of prior CABG. The visualized bowel gas pattern is unremarkable. Scattered stool and air are seen within the colon; there is no evidence of small bowel dilatation to suggest obstruction. No free intra-abdominal air is identified on the provided upright view. Stones are seen layering dependently within the gallbladder. An apparent  penile prosthesis is partially imaged. No acute osseous abnormalities are seen; the sacroiliac joints are unremarkable in appearance. IMPRESSION: 1. Unremarkable bowel gas pattern; no free intra-abdominal air seen. 2. No acute cardiopulmonary process identified. 3. Cholelithiasis noted. Electronically Signed By: Garald Balding M.D. On: 05/07/2013 00:50  Disposition and Follow-up:  Discharge Orders    Future Appointments  Provider  Department  Dept Phone    06/26/2013 1:45 PM  Milus Banister, MD  [facility]  [phone]    10/01/2013 8:00 AM  Susy Frizzle, MD  [facility]  [phone]    Future Orders  Complete By  Expires    Diet Carb Modified  As directed     Discharge instructions  As directed     Increase activity slowly  As directed        patient has done extremely well since discharge. In fact he's been cutting down trees and clearing brush on his property. He denies any fatigue or chest pain or shortness of breath or dyspnea on exertion. He denies any melena or hematochezia. He is had no further nausea or vomiting or hematemesis. He is still taking his aspirin. He is also still taking his PPI been recommended to be continued indefinitely. He is scheduled to see GI later this month. His CLOtest was Urease negative. Past Medical History  Diagnosis Date  . Asthma   . 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 Atrial enlargement, left   . Hearing loss   . Diverticulosis   . ED (erectile dysfunction)   . Hiatal hernia 05/2008    EGD with HH and reflux esophagitis.   . Gout   . Diabetes mellitus   . GERD (gastroesophageal reflux disease)   . Hyperlipidemia   . Hypertension   . History of MI (myocardial infarction)   . Ischemic cardiomyopathy     EF 45%, with inferior wall motion abnormality   . History of nuclear stress test 08/07/2010    dipyridamole; EKG negative for ischemia, low risk scan   . Valvular regurgitation      mitral and tricuspid (mild)  . Hyperplastic colon polyp 05/2008  . Acute gastric ulcer with hemorrhage 05/06/2013   Past Surgical History  Procedure Laterality Date  . Coronary artery bypass graft  2000    LIMA to LAD, free IMA to OM2, sequential graft to PLA & PLD  . Transthoracic echocardiogram  09/07/2010    EF 85-46%; LV systolic function mildly reduced; LA mildly dilated; mild-mod MR & mild-mod TR; aortic root sclerosis/calcification;   . Esophagogastroduodenoscopy N/A 05/07/2013    Procedure: ESOPHAGOGASTRODUODENOSCOPY (EGD);  Surgeon: Gatha Mayer, MD;  Location: [facility] ENDOSCOPY;  Service: Endoscopy;  Laterality: N/A;   Current Outpatient Prescriptions on File Prior to Visit  Medication Sig Dispense Refill  . allopurinol (ZYLOPRIM) 300 MG tablet Take 300 mg by mouth daily.      Marland Kitchen amLODipine (NORVASC) 10 MG tablet Take 10 mg by mouth daily.      Marland Kitchen aspirin 81 MG tablet Take 81 mg by mouth daily.      . benazepril (LOTENSIN) 40 MG tablet Take 40 mg by mouth daily.      . cloNIDine (CATAPRES) 0.2 MG tablet Take 0.2 mg by mouth 2 (two) times daily.      . metoprolol (LOPRESSOR) 50 MG tablet Take 75 mg by mouth 2 (two) times daily.      . pantoprazole (PROTONIX) 40 MG tablet Take 1 tablet (40 mg total) by mouth daily.  90 tablet  3  . simvastatin (ZOCOR) 40 MG tablet Take 40 mg by mouth daily.       No current facility-administered medications on file prior to visit.   No Known Allergies History   Social History  .  Marital Status: Married    Spouse Name: N/A    Number of Children: N/A  . Years of Education: N/A   Occupational History  . Not on file.   Social History Main Topics  . Smoking status: Former Smoker    Quit date: 11/04/1985  . Smokeless tobacco: Never Used  . Alcohol Use: Yes  . Drug Use: Yes  . Sexual Activity: Not on file   Other Topics Concern  . Not on file   Social History Narrative  . No narrative on file      Review of Systems  All other systems  reviewed and are negative.      Objective:   Physical Exam  Vitals reviewed. Constitutional: He appears well-developed and well-nourished. No distress.  Eyes: Conjunctivae are normal. No scleral icterus.  Neck: Neck supple. No JVD present. No thyromegaly present.  Cardiovascular: Normal rate, regular rhythm, normal heart sounds and intact distal pulses.   No murmur heard. Pulmonary/Chest: Breath sounds normal. No respiratory distress. He has no wheezes. He has no rales. He exhibits no tenderness.  Abdominal: Soft. Bowel sounds are normal. He exhibits no distension. There is no tenderness. There is no rebound and no guarding.  Musculoskeletal: He exhibits no edema.  Lymphadenopathy:    He has no cervical adenopathy.  Skin: He is not diaphoretic.          Assessment & Plan:  1. Hospital discharge follow-up Is doing well post hospital as a Cardiologic standpoint he has been stable for some time. His blood pressure, cholesterol, and diabetes had been well controlled. He has been asymptomatic. Therefore I believe the risk of him taking an aspirin recently after an upper GI bleed outweighs its short-term benefit. I recommended he temporarily hold his aspirin for one to 2 more weeks to allow the ulcer to heal. Afterwards I would like him to resume his aspirin.  I'll recheck a CBC to ensure that he has had no further drop in his hemoglobin. Otherwise followup according to his regular schedule. - CBC with Differential - COMPLETE METABOLIC PANEL WITH GFR

## 2013-05-19 LAB — CBC WITH DIFFERENTIAL/PLATELET
BASOS ABS: 0 10*3/uL (ref 0.0–0.1)
BASOS PCT: 0 % (ref 0–1)
EOS PCT: 3 % (ref 0–5)
Eosinophils Absolute: 0.2 10*3/uL (ref 0.0–0.7)
HCT: 30.1 % — ABNORMAL LOW (ref 39.0–52.0)
Hemoglobin: 9.7 g/dL — ABNORMAL LOW (ref 13.0–17.0)
Lymphocytes Relative: 25 % (ref 12–46)
Lymphs Abs: 1.6 10*3/uL (ref 0.7–4.0)
MCH: 29.3 pg (ref 26.0–34.0)
MCHC: 32.2 g/dL (ref 30.0–36.0)
MCV: 90.9 fL (ref 78.0–100.0)
Monocytes Absolute: 0.6 10*3/uL (ref 0.1–1.0)
Monocytes Relative: 9 % (ref 3–12)
Neutro Abs: 4 10*3/uL (ref 1.7–7.7)
Neutrophils Relative %: 63 % (ref 43–77)
Platelets: 288 10*3/uL (ref 150–400)
RBC: 3.31 MIL/uL — ABNORMAL LOW (ref 4.22–5.81)
RDW: 14.1 % (ref 11.5–15.5)
WBC: 6.4 10*3/uL (ref 4.0–10.5)

## 2013-05-19 LAB — COMPLETE METABOLIC PANEL WITH GFR
ALK PHOS: 53 U/L (ref 39–117)
ALT: 10 U/L (ref 0–53)
AST: 16 U/L (ref 0–37)
Albumin: 3.9 g/dL (ref 3.5–5.2)
BILIRUBIN TOTAL: 0.6 mg/dL (ref 0.2–1.2)
BUN: 16 mg/dL (ref 6–23)
CO2: 25 mEq/L (ref 19–32)
CREATININE: 1.11 mg/dL (ref 0.50–1.35)
Calcium: 8.4 mg/dL (ref 8.4–10.5)
Chloride: 107 mEq/L (ref 96–112)
GFR, Est African American: 77 mL/min
GFR, Est Non African American: 66 mL/min
Glucose, Bld: 116 mg/dL — ABNORMAL HIGH (ref 70–99)
Potassium: 4.3 mEq/L (ref 3.5–5.3)
Sodium: 143 mEq/L (ref 135–145)
Total Protein: 5.5 g/dL — ABNORMAL LOW (ref 6.0–8.3)

## 2013-05-22 ENCOUNTER — Telehealth: Payer: Self-pay | Admitting: Family Medicine

## 2013-05-22 DIAGNOSIS — D539 Nutritional anemia, unspecified: Secondary | ICD-10-CM

## 2013-05-22 MED ORDER — FERROUS SULFATE 325 (65 FE) MG PO TABS: 325.0000 mg | ORAL_TABLET | Freq: Two times a day (BID) | ORAL | Status: DC

## 2013-05-22 NOTE — Telephone Encounter (Signed)
Message copied by Olena Mater on Tue May 22, 2013  9:08 AM ------      Message from: Jenna Luo      Created: Mon May 21, 2013  7:09 AM       hgb dropped slightly.  Stop aspirin and repeat cbc this Thursday.  Start feso4 325 pobid to help replace lost blood. ------

## 2013-05-22 NOTE — Telephone Encounter (Signed)
Spoke to wife about lab results.  Aware of Iron supplement needed.  Rx to pharmacy.  Need repeat CBC in one week, order placed.

## 2013-05-28 ENCOUNTER — Other Ambulatory Visit: Payer: Medicare Other

## 2013-05-28 DIAGNOSIS — D539 Nutritional anemia, unspecified: Secondary | ICD-10-CM | POA: Diagnosis not present

## 2013-05-28 LAB — CBC WITH DIFFERENTIAL/PLATELET
Basophils Absolute: 0 10*3/uL (ref 0.0–0.1)
Basophils Relative: 0 % (ref 0–1)
Eosinophils Absolute: 0.3 10*3/uL (ref 0.0–0.7)
Eosinophils Relative: 6 % — ABNORMAL HIGH (ref 0–5)
HEMATOCRIT: 31.9 % — AB (ref 39.0–52.0)
Hemoglobin: 10.2 g/dL — ABNORMAL LOW (ref 13.0–17.0)
LYMPHS ABS: 1.2 10*3/uL (ref 0.7–4.0)
LYMPHS PCT: 24 % (ref 12–46)
MCH: 28.6 pg (ref 26.0–34.0)
MCHC: 32 g/dL (ref 30.0–36.0)
MCV: 89.4 fL (ref 78.0–100.0)
Monocytes Absolute: 0.5 10*3/uL (ref 0.1–1.0)
Monocytes Relative: 10 % (ref 3–12)
Neutro Abs: 2.9 10*3/uL (ref 1.7–7.7)
Neutrophils Relative %: 60 % (ref 43–77)
Platelets: 269 10*3/uL (ref 150–400)
RBC: 3.57 MIL/uL — AB (ref 4.22–5.81)
RDW: 14.4 % (ref 11.5–15.5)
WBC: 4.9 10*3/uL (ref 4.0–10.5)

## 2013-06-26 ENCOUNTER — Ambulatory Visit: Payer: Medicare Other | Admitting: Gastroenterology

## 2013-06-28 ENCOUNTER — Encounter: Payer: Self-pay | Admitting: Physician Assistant

## 2013-06-28 ENCOUNTER — Ambulatory Visit (INDEPENDENT_AMBULATORY_CARE_PROVIDER_SITE_OTHER): Payer: Medicare Other | Admitting: Physician Assistant

## 2013-06-28 VITALS — BP 110/52 | HR 68 | Temp 98.1°F | Resp 14 | Ht 68.5 in | Wt 161.0 lb

## 2013-06-28 DIAGNOSIS — K250 Acute gastric ulcer with hemorrhage: Secondary | ICD-10-CM

## 2013-06-28 DIAGNOSIS — L259 Unspecified contact dermatitis, unspecified cause: Secondary | ICD-10-CM | POA: Diagnosis not present

## 2013-06-28 DIAGNOSIS — L239 Allergic contact dermatitis, unspecified cause: Secondary | ICD-10-CM

## 2013-06-28 DIAGNOSIS — I2510 Atherosclerotic heart disease of native coronary artery without angina pectoris: Secondary | ICD-10-CM | POA: Diagnosis not present

## 2013-06-28 LAB — CBC WITH DIFFERENTIAL/PLATELET
Basophils Absolute: 0 10*3/uL (ref 0.0–0.1)
Basophils Relative: 0 % (ref 0–1)
EOS ABS: 0.1 10*3/uL (ref 0.0–0.7)
Eosinophils Relative: 1 % (ref 0–5)
HEMATOCRIT: 41.4 % (ref 39.0–52.0)
Hemoglobin: 13.3 g/dL (ref 13.0–17.0)
Lymphocytes Relative: 26 % (ref 12–46)
Lymphs Abs: 1.5 10*3/uL (ref 0.7–4.0)
MCH: 28.9 pg (ref 26.0–34.0)
MCHC: 32.1 g/dL (ref 30.0–36.0)
MCV: 90 fL (ref 78.0–100.0)
MONOS PCT: 11 % (ref 3–12)
Monocytes Absolute: 0.6 10*3/uL (ref 0.1–1.0)
NEUTROS PCT: 62 % (ref 43–77)
Neutro Abs: 3.7 10*3/uL (ref 1.7–7.7)
Platelets: 192 10*3/uL (ref 150–400)
RBC: 4.6 MIL/uL (ref 4.22–5.81)
RDW: 14.9 % (ref 11.5–15.5)
WBC: 5.9 10*3/uL (ref 4.0–10.5)

## 2013-06-28 MED ORDER — PREDNISONE 20 MG PO TABS: ORAL_TABLET | ORAL | Status: DC

## 2013-06-28 NOTE — Progress Notes (Signed)
Patient ID: RYKIN ROUTE MRN: [id number], DOB: [birth year], 73 y.o. Date of Encounter: 06/28/2013, 3:06 PM    Chief Complaint:  Chief Complaint  Patient presents with  . Right >left foot swelling    Subsequently got bitten by something on right foot     HPI: 73 y.o. year old white male says that for many years he has had some swelling but it would always resolve when he would lay down to sleep at night. He thinks that since he was in the hospital in May with a bleeding ulcer that since then he has been having a little bit more swelling than prior to that.  Says yesterday morning he was outside watering some plants. Was wearing crocs. He felt something bite his right foot. However did not see what it was. Since then has developed redness and swelling of the right foot.So here for evaluation of this.    Home Meds:   Outpatient Prescriptions Prior to Visit  Medication Sig Dispense Refill  . allopurinol (ZYLOPRIM) 300 MG tablet Take 300 mg by mouth daily.      Marland Kitchen amLODipine (NORVASC) 10 MG tablet Take 10 mg by mouth daily.      Marland Kitchen aspirin 81 MG tablet Take 81 mg by mouth daily.      . benazepril (LOTENSIN) 40 MG tablet Take 40 mg by mouth daily.      . cloNIDine (CATAPRES) 0.2 MG tablet Take 0.2 mg by mouth 2 (two) times daily.      . ferrous sulfate 325 (65 FE) MG tablet Take 1 tablet (325 mg total) by mouth 2 (two) times daily with a meal.  60 tablet  2  . metoprolol (LOPRESSOR) 50 MG tablet Take 75 mg by mouth 2 (two) times daily.      . pantoprazole (PROTONIX) 40 MG tablet Take 1 tablet (40 mg total) by mouth daily.  90 tablet  3  . simvastatin (ZOCOR) 40 MG tablet Take 40 mg by mouth daily.       No facility-administered medications prior to visit.    Allergies: No Known Allergies    Review of Systems: See HPI for pertinent ROS. All other ROS negative.    Physical Exam: Blood pressure 110/52, pulse 68, temperature 98.1 F (36.7 C), temperature source Oral, resp. rate 14,  height 5' 8.5" (1.74 m), weight 161 lb (73.029 kg)., Body mass index is 24.12 kg/(m^2). General: WNWD WM.  Appears in no acute distress. Neck: Supple. No thyromegaly. No lymphadenopathy. Lungs: Clear bilaterally to auscultation without wheezes, rales, or rhonchi. Breathing is unlabored. Heart: Regular rhythm. No murmurs, rubs, or gallops. Msk:  Strength and tone normal for age. Extremities/Skin: LeftFoot: No swelling.  Right Foot: Entire Dorsal Surface of the Right Foot is swollen and pink in color. Swelling up to level of medial and lateral malleolus, so that these are not well visualized. Inferior to the medial malleolus is a approx 1/2 cm pink blister--he says this is site where he felt the "bite"/sting. Plantar  Surface of the foot is normal. Pink color and swelling only extend to level of ankle, then resolves.   Neuro: Alert and oriented X 3. Moves all extremities spontaneously. Gait is normal. CNII-XII grossly in tact. Psych:  Responds to questions appropriately with a normal affect.     ASSESSMENT AND PLAN:  73 y.o. year old male with  1. 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 Can use prednisone without having to void without significant hyperglycemia. Told him  to take this as directed. Told him to take Benadryl with this. Told him to followup if the site worsens or if it is not improving over the next 48 hours. - predniSONE (DELTASONE) 20 MG tablet; Take 3 daily for 2 days, then 2 daily for 2 days, then 1 daily for 2 days.  Dispense: 12 tablet; Refill: 0  2. H/O Acute gastric ulcer with hemorrhage He asks to  get a followup H/H while he is here. Says he is eating PPI and iron as directed. - CBC with Differential   Signed, [address] [place], [place], [facility] 06/28/2013 3:06 PM

## 2013-07-10 ENCOUNTER — Ambulatory Visit (INDEPENDENT_AMBULATORY_CARE_PROVIDER_SITE_OTHER): Payer: Medicare Other | Admitting: Gastroenterology

## 2013-07-10 ENCOUNTER — Encounter: Payer: Self-pay | Admitting: Gastroenterology

## 2013-07-10 VITALS — BP 112/50 | HR 60 | Ht 66.5 in | Wt 162.0 lb

## 2013-07-10 DIAGNOSIS — I2510 Atherosclerotic heart disease of native coronary artery without angina pectoris: Secondary | ICD-10-CM

## 2013-07-10 DIAGNOSIS — K257 Chronic gastric ulcer without hemorrhage or perforation: Secondary | ICD-10-CM | POA: Diagnosis not present

## 2013-07-10 NOTE — Progress Notes (Signed)
Review of pertinent gastrointestinal problems: 1. Gastric ulcer: hematemesis; Hb 9.7, EGD [facility] as inpatient, gastric ulcer noted (3mm, cardia), CLOtest negative.  Was on 81 ASA daily without PPI> 2. Routine risk for colon cancer:  Screening colonoscopy [facility] 05/2008 found diverticulosis, single small HP polyp, recommened recall at 10 years.  HPI: This is a   very pleasant 73 year old man whom I have not seen in several years however he was seen by one of my partners about 6-8 weeks ago when he was hospitalized for GI bleeding.  Was hospitalized for 2 days for hematesis, underwent EGD (see above).    He has had constipation since hospitalization (started iron BID and PPI).   Past Medical History  Diagnosis Date  . Asthma   . 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 Atrial enlargement, left   . Hearing loss   . Diverticulosis   . ED (erectile dysfunction)   . Hiatal hernia 05/2008    EGD with HH and reflux esophagitis.   . Gout   . Diabetes mellitus   . GERD (gastroesophageal reflux disease)   . Hyperlipidemia   . Hypertension   . History of MI (myocardial infarction)   . Ischemic cardiomyopathy     EF 45%, with inferior wall motion abnormality   . History of nuclear stress test 08/07/2010    dipyridamole; EKG negative for ischemia, low risk scan   . Valvular regurgitation     mitral and tricuspid (mild)  . Hyperplastic colon polyp 05/2008  . Acute gastric ulcer with hemorrhage 05/06/2013    Past Surgical History  Procedure Laterality Date  . Coronary artery bypass graft  2000    LIMA to LAD, free IMA to OM2, sequential graft to PLA & PLD  . Transthoracic echocardiogram  09/07/2010    EF 96-29%; LV systolic function mildly reduced; LA mildly dilated; mild-mod MR & mild-mod TR; aortic root sclerosis/calcification;   . Esophagogastroduodenoscopy N/A 05/07/2013    Procedure: ESOPHAGOGASTRODUODENOSCOPY (EGD);  Surgeon: Gatha Mayer, MD;  Location: [facility] ENDOSCOPY;   Service: Endoscopy;  Laterality: N/A;    Current Outpatient Prescriptions  Medication Sig Dispense Refill  . allopurinol (ZYLOPRIM) 300 MG tablet Take 300 mg by mouth daily.      Marland Kitchen amLODipine (NORVASC) 10 MG tablet Take 10 mg by mouth daily.      Marland Kitchen aspirin 81 MG tablet Take 81 mg by mouth daily.      . benazepril (LOTENSIN) 40 MG tablet Take 40 mg by mouth daily.      . cloNIDine (CATAPRES) 0.2 MG tablet Take 0.2 mg by mouth 2 (two) times daily.      . ferrous sulfate 325 (65 FE) MG tablet Take 1 tablet (325 mg total) by mouth 2 (two) times daily with a meal.  60 tablet  2  . metoprolol (LOPRESSOR) 50 MG tablet Take 75 mg by mouth 2 (two) times daily.      . pantoprazole (PROTONIX) 40 MG tablet Take 1 tablet (40 mg total) by mouth daily.  90 tablet  3  . simvastatin (ZOCOR) 40 MG tablet Take 40 mg by mouth daily.       No current facility-administered medications for this visit.    Allergies as of 07/10/2013  . (No Known Allergies)    Family History  Problem Relation Age of Onset  . Stroke Father   . Stroke Maternal Grandmother   . Stroke Maternal Grandfather   . Hypertension Father   .  Diabetes Brother     History   Social History  . Marital Status: Married    Spouse Name: N/A    Number of Children: 1  . Years of Education: N/A   Occupational History  . Not on file.   Social History Main Topics  . Smoking status: Former Smoker    Quit date: 11/04/1985  . Smokeless tobacco: Never Used  . Alcohol Use: Yes  . Drug Use: Yes  . Sexual Activity: Not on file   Other Topics Concern  . Not on file   Social History Narrative  . No narrative on file      Physical Exam: BP 112/50  Pulse 60  Ht 5' 6.5" (1.689 m)  Wt 162 lb (73.483 kg)  BMI 25.76 kg/m2 Constitutional: generally well-appearing Psychiatric: alert and oriented x3 Abdomen: soft, nontender, nondistended, no obvious ascites, no peritoneal signs, normal bowel sounds     Assessment and plan: 73  y.o. male with recent gastric ulcer  His constipation and dark greenish stools are almost certainly from iron supplements. I recommended that he stop the iron supplements since his blood counts have normalized, his RDW is back to normal as well. He will stand Protonix once daily or other PPI for as long as he requires daily aspirin therapy. I recommende EGD at his soonest convenience to check for gastric ulcer healing.d we proceed with

## 2013-07-10 NOTE — Patient Instructions (Signed)
You will be set up for an upper endoscopy (for gastric ulcer). Stop taking iron supplements. Stay on protonix one pill once daily.

## 2013-07-11 ENCOUNTER — Ambulatory Visit (AMBULATORY_SURGERY_CENTER): Payer: Medicare Other | Admitting: Gastroenterology

## 2013-07-11 ENCOUNTER — Encounter: Payer: Self-pay | Admitting: Gastroenterology

## 2013-07-11 VITALS — BP 110/49 | HR 50 | Temp 96.3°F | Resp 35 | Ht 66.5 in | Wt 162.0 lb

## 2013-07-11 DIAGNOSIS — K257 Chronic gastric ulcer without hemorrhage or perforation: Secondary | ICD-10-CM | POA: Diagnosis not present

## 2013-07-11 DIAGNOSIS — I2510 Atherosclerotic heart disease of native coronary artery without angina pectoris: Secondary | ICD-10-CM | POA: Diagnosis not present

## 2013-07-11 DIAGNOSIS — J45909 Unspecified asthma, uncomplicated: Secondary | ICD-10-CM | POA: Diagnosis not present

## 2013-07-11 DIAGNOSIS — K259 Gastric ulcer, unspecified as acute or chronic, without hemorrhage or perforation: Secondary | ICD-10-CM | POA: Diagnosis not present

## 2013-07-11 LAB — GLUCOSE, CAPILLARY
GLUCOSE-CAPILLARY: 126 mg/dL — AB (ref 70–99)
Glucose-Capillary: 106 mg/dL — ABNORMAL HIGH (ref 70–99)

## 2013-07-11 MED ORDER — SODIUM CHLORIDE 0.9 % IV SOLN: 500.0000 mL | INTRAVENOUS | Status: DC

## 2013-07-11 NOTE — Progress Notes (Signed)
A/ox3 pleased with MAC, report to Jane RN 

## 2013-07-11 NOTE — Patient Instructions (Signed)
YOU HAD AN ENDOSCOPIC PROCEDURE TODAY AT THE [facility]: Refer to the procedure report that was given to you for any specific questions about what was found during the examination.  If the procedure report does not answer your questions, please call your gastroenterologist to clarify.  If you requested that your care partner not be given the details of your procedure findings, then the procedure report has been included in a sealed envelope for you to review at your convenience later.  YOU SHOULD EXPECT: Some feelings of bloating in the abdomen. Passage of more gas than usual.  Walking can help get rid of the air that was put into your GI tract during the procedure and reduce the bloating. If you had a lower endoscopy (such as a colonoscopy or flexible sigmoidoscopy) you may notice spotting of blood in your stool or on the toilet paper. If you underwent a bowel prep for your procedure, then you may not have a normal bowel movement for a few days.  DIET: Your first meal following the procedure should be a light meal and then it is ok to progress to your normal diet.  A half-sandwich or bowl of soup is an example of a good first meal.  Heavy or fried foods are harder to digest and may make you feel nauseous or bloated.  Likewise meals heavy in dairy and vegetables can cause extra gas to form and this can also increase the bloating.  Drink plenty of fluids but you should avoid alcoholic beverages for 24 hours.  ACTIVITY: Your care partner should take you home directly after the procedure.  You should plan to take it easy, moving slowly for the rest of the day.  You can resume normal activity the day after the procedure however you should NOT DRIVE or use heavy machinery for 24 hours (because of the sedation medicines used during the test).    SYMPTOMS TO REPORT IMMEDIATELY: A gastroenterologist can be reached at any hour.  During normal business hours, 8:30 AM to 5:00 PM Monday through Friday,  call [phone].  After hours and on weekends, please call the GI answering service at [phone] who will take a message and have the physician on call contact you.   Following lower endoscopy (colonoscopy or flexible sigmoidoscopy):  Excessive amounts of blood in the stool  Significant tenderness or worsening of abdominal pains  Swelling of the abdomen that is new, acute  Fever of 100F or higher  Following upper endoscopy (EGD)  Vomiting of blood or coffee ground material  New chest pain or pain under the shoulder blades  Painful or persistently difficult swallowing  New shortness of breath  Fever of 100F or higher  Black, tarry-looking stools  FOLLOW UP: If any biopsies were taken you will be contacted by phone or by letter within the next 1-3 weeks.  Call your gastroenterologist if you have not heard about the biopsies in 3 weeks.  Our staff will call the home number listed on your records the next business day following your procedure to check on you and address any questions or concerns that you may have at that time regarding the information given to you following your procedure. This is a courtesy call and so if there is no answer at the home number and we have not heard from you through the emergency physician on call, we will assume that you have returned to your regular daily activities without incident.  SIGNATURES/CONFIDENTIALITY: You and/or your care   partner have signed paperwork which will be entered into your electronic medical record.  These signatures attest to the fact that that the information above on your After Visit Summary has been reviewed and is understood.  Full responsibility of the confidentiality of this discharge information lies with you and/or your care-partner.  Stay on Protonix as long as you are taking Aspirin.

## 2013-07-11 NOTE — Op Note (Signed)
[place]  [address] [place], [address]   ENDOSCOPY PROCEDURE REPORT  PATIENT: Donald, Rios  MR#: [id number] BIRTHDATE: [birth year] , 72  yrs. old GENDER: Male ENDOSCOPIST: Milus Banister, MD PROCEDURE DATE:  07/11/2013 PROCEDURE:  EGD, diagnostic ASA CLASS:     Class II INDICATIONS:  Gastric ulcer: hematemesis; Hb 9.7, EGD [facility] as inpatient, gastric ulcer noted (12mm, cardia), CLOtest negative. Was on 81 ASA daily without PPI. MEDICATIONS: MAC sedation, administered by CRNA and Propofol (Diprivan) 120 mg IV TOPICAL ANESTHETIC: none  DESCRIPTION OF PROCEDURE: After the risks benefits and alternatives of the procedure were thoroughly explained, informed consent was obtained.  The LB [id number] endoscope was introduced through the mouth and advanced to the second portion of the duodenum. Without limitations.  The instrument was slowly withdrawn as the mucosa was fully examined.   The upper, middle and distal third of the esophagus were carefully inspected and no abnormalities were noted.  The z-line was well seen at the GEJ.  The endoscope was pushed into the fundus which was normal including a retroflexed view.  The antrum, gastric body, first and second part of the duodenum were unremarkable. Retroflexed views revealed no abnormalities.     The scope was then withdrawn from the patient and the procedure completed.  COMPLICATIONS: There were no complications. ENDOSCOPIC IMPRESSION: Normal EGD The previously noted gastric ulcer has completely healed  RECOMMENDATIONS: Continue recommendations discussed yesterday in office (stay on PPI such as protonix for as long as you take daily aspirin).   eSigned:  Milus Banister, MD 07/11/2013 9:57 AM

## 2013-07-12 ENCOUNTER — Telehealth: Payer: Self-pay | Admitting: *Deleted

## 2013-07-12 NOTE — Telephone Encounter (Signed)
  Follow up Call-  Call back number 07/11/2013  Post procedure Call Back phone  # [phone]  Permission to leave phone message Yes     Patient questions:  Do you have a fever, pain , or abdominal swelling? No. Pain Score  0 *  Have you tolerated food without any problems? Yes.    Have you been able to return to your normal activities? Yes.    Do you have any questions about your discharge instructions: Diet   No. Medications  No. Follow up visit  No.  Do you have questions or concerns about your Care? No.  Actions: * If pain score is 4 or above: No action needed, pain <4.

## 2013-08-23 ENCOUNTER — Encounter: Payer: Self-pay | Admitting: Gastroenterology

## 2013-10-01 ENCOUNTER — Encounter: Payer: Self-pay | Admitting: Family Medicine

## 2013-10-01 ENCOUNTER — Ambulatory Visit (INDEPENDENT_AMBULATORY_CARE_PROVIDER_SITE_OTHER): Payer: Medicare Other | Admitting: Family Medicine

## 2013-10-01 VITALS — BP 142/78 | HR 68 | Temp 98.1°F | Resp 18 | Wt 161.0 lb

## 2013-10-01 DIAGNOSIS — I10 Essential (primary) hypertension: Secondary | ICD-10-CM | POA: Diagnosis not present

## 2013-10-01 DIAGNOSIS — Z23 Encounter for immunization: Secondary | ICD-10-CM | POA: Diagnosis not present

## 2013-10-01 DIAGNOSIS — I2510 Atherosclerotic heart disease of native coronary artery without angina pectoris: Secondary | ICD-10-CM | POA: Diagnosis not present

## 2013-10-01 DIAGNOSIS — E785 Hyperlipidemia, unspecified: Secondary | ICD-10-CM | POA: Diagnosis not present

## 2013-10-01 DIAGNOSIS — E119 Type 2 diabetes mellitus without complications: Secondary | ICD-10-CM

## 2013-10-01 LAB — COMPLETE METABOLIC PANEL WITH GFR
ALT: 10 U/L (ref 0–53)
AST: 15 U/L (ref 0–37)
Albumin: 4.2 g/dL (ref 3.5–5.2)
Alkaline Phosphatase: 64 U/L (ref 39–117)
BUN: 14 mg/dL (ref 6–23)
CALCIUM: 9.5 mg/dL (ref 8.4–10.5)
CO2: 26 meq/L (ref 19–32)
Chloride: 109 mEq/L (ref 96–112)
Creat: 1.13 mg/dL (ref 0.50–1.35)
GFR, Est African American: 75 mL/min
GFR, Est Non African American: 65 mL/min
Glucose, Bld: 123 mg/dL — ABNORMAL HIGH (ref 70–99)
Potassium: 4.1 mEq/L (ref 3.5–5.3)
Sodium: 142 mEq/L (ref 135–145)
Total Bilirubin: 1.3 mg/dL — ABNORMAL HIGH (ref 0.2–1.2)
Total Protein: 6.1 g/dL (ref 6.0–8.3)

## 2013-10-01 LAB — LIPID PANEL
Cholesterol: 121 mg/dL (ref 0–200)
HDL: 55 mg/dL (ref >39)
LDL Cholesterol: 51 mg/dL (ref 0–99)
Total CHOL/HDL Ratio: 2.2 Ratio
Triglycerides: 77 mg/dL (ref <150)
VLDL: 15 mg/dL (ref 0–40)

## 2013-10-01 LAB — HEMOGLOBIN A1C
HEMOGLOBIN A1C: 6.4 % — AB (ref <5.7)
MEAN PLASMA GLUCOSE: 137 mg/dL — AB (ref <117)

## 2013-10-01 MED ORDER — ALPRAZOLAM 0.5 MG PO TABS: 0.5000 mg | ORAL_TABLET | Freq: Every evening | ORAL | Status: DC | PRN

## 2013-10-01 NOTE — Progress Notes (Signed)
Subjective:    Patient ID: Donald Rios, male    DOB: 1940/12/02, 73 y.o.   MRN: [id number]  HPI  Patient is here today for his follow his diabetes, hypertension, and hyperlipidemia. He has a history of coronary artery disease. He is currently taking an aspirin. He denies any chest pain shortness of breath or dyspnea on exertion. He has a faint 1/6 systolic ejection murmur. He does have a history of mitral regurgitation. He denies any syncope or presyncope. His blood sugars in the morning fasting range between 130 and 140. He denies any hypoglycemia. He denies any polyuria, polydipsia, or blurred vision. He denies any myalgias or right upper quadrant pain. He is due for his flu shot.  His diabetic eye exam is due this probably schedule this on his own. Past Medical History  Diagnosis Date  . Asthma   . 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 Atrial enlargement, left   . Hearing loss   . Diverticulosis   . ED (erectile dysfunction)   . Hiatal hernia 05/2008    EGD with HH and reflux esophagitis.   . Gout   . Diabetes mellitus   . GERD (gastroesophageal reflux disease)   . Hyperlipidemia   . Hypertension   . History of MI (myocardial infarction)   . Ischemic cardiomyopathy     EF 45%, with inferior wall motion abnormality   . History of nuclear stress test 08/07/2010    dipyridamole; EKG negative for ischemia, low risk scan   . Valvular regurgitation     mitral and tricuspid (mild)  . Hyperplastic colon polyp 05/2008  . Acute gastric ulcer with hemorrhage 05/06/2013   Past Surgical History  Procedure Laterality Date  . Coronary artery bypass graft  2000    LIMA to LAD, free IMA to OM2, sequential graft to PLA & PLD  . Transthoracic echocardiogram  09/07/2010    EF 71-69%; LV systolic function mildly reduced; LA mildly dilated; mild-mod MR & mild-mod TR; aortic root sclerosis/calcification;   . Esophagogastroduodenoscopy N/A 05/07/2013    Procedure:  ESOPHAGOGASTRODUODENOSCOPY (EGD);  Surgeon: Gatha Mayer, MD;  Location: [facility] ENDOSCOPY;  Service: Endoscopy;  Laterality: N/A;   Current Outpatient Prescriptions on File Prior to Visit  Medication Sig Dispense Refill  . allopurinol (ZYLOPRIM) 300 MG tablet Take 300 mg by mouth daily.      Marland Kitchen amLODipine (NORVASC) 10 MG tablet Take 10 mg by mouth daily.      Marland Kitchen aspirin 81 MG tablet Take 81 mg by mouth daily.      . benazepril (LOTENSIN) 40 MG tablet Take 40 mg by mouth daily.      . cloNIDine (CATAPRES) 0.2 MG tablet Take 0.2 mg by mouth 2 (two) times daily.      . metoprolol (LOPRESSOR) 50 MG tablet Take 75 mg by mouth 2 (two) times daily.      . pantoprazole (PROTONIX) 40 MG tablet Take 1 tablet (40 mg total) by mouth daily.  90 tablet  3  . simvastatin (ZOCOR) 40 MG tablet Take 40 mg by mouth daily.      . ferrous sulfate 325 (65 FE) MG tablet Take 1 tablet (325 mg total) by mouth 2 (two) times daily with a meal.  60 tablet  2   No current facility-administered medications on file prior to visit.   No Known Allergies History   Social History  . Marital Status: Married    Spouse Name: N/A  Number of Children: 1  . Years of Education: N/A   Occupational History  . Not on file.   Social History Main Topics  . Smoking status: Former Smoker    Quit date: 11/04/1985  . Smokeless tobacco: Never Used  . Alcohol Use: 1.2 - 1.8 oz/week    2-3 Cans of beer per week  . Drug Use: No  . Sexual Activity: Not on file   Other Topics Concern  . Not on file   Social History Narrative  . No narrative on file     Review of Systems  All other systems reviewed and are negative.      Objective:   Physical Exam  Vitals reviewed. Constitutional: He appears well-developed and well-nourished.  Neck: Neck supple. No JVD present. No thyromegaly present.  Cardiovascular: Normal rate and regular rhythm.   Murmur heard. Pulmonary/Chest: Effort normal and breath sounds normal. No respiratory  distress. He has no wheezes. He has no rales.  Abdominal: Soft. Bowel sounds are normal. He exhibits no distension. There is no tenderness. There is no rebound and no guarding.  Musculoskeletal: He exhibits no edema.  Lymphadenopathy:    He has no cervical adenopathy.          Assessment & Plan:  Type II or unspecified type diabetes mellitus without mention of complication, not stated as uncontrolled - Plan: COMPLETE METABOLIC PANEL WITH GFR, Hemoglobin A1c, Lipid panel, Microalbumin, urine  Essential hypertension  HLD (hyperlipidemia)   Check the patient's hemoglobin A1c. If his hemoglobin A1c is greater than 6.5, we will resume metformin. Blood pressure is borderline today but patient has been racing around to get here. Usually it ranges between 110 and 120. Patient's diabetic foot exam is normal. His diabetic eye exam is up to date. Check a fasting lipid panel as well as a urine microalbumin. Goal LDL cholesterol is less than 70.  Patient is having occasional insomnia. He frequently wakes up one morning and is unable to go back to sleep. Again the patient Xanax 0.5 mg 1 by mouth each bedtime when necessary insomnia. Counseled the patient to use that medication sparingly.

## 2013-10-02 ENCOUNTER — Encounter: Payer: Self-pay | Admitting: *Deleted

## 2013-10-02 ENCOUNTER — Other Ambulatory Visit: Payer: Medicare Other

## 2013-10-02 DIAGNOSIS — E119 Type 2 diabetes mellitus without complications: Secondary | ICD-10-CM | POA: Diagnosis not present

## 2013-10-02 LAB — MICROALBUMIN, URINE: MICROALB UR: 0.2 mg/dL (ref <2.0)

## 2013-12-11 DIAGNOSIS — E119 Type 2 diabetes mellitus without complications: Secondary | ICD-10-CM | POA: Diagnosis not present

## 2013-12-28 ENCOUNTER — Telehealth: Payer: Self-pay | Admitting: Family Medicine

## 2013-12-28 MED ORDER — AMOXICILLIN-POT CLAVULANATE 875-125 MG PO TABS: 1.0000 | ORAL_TABLET | Freq: Two times a day (BID) | ORAL | Status: DC

## 2013-12-28 NOTE — Telephone Encounter (Signed)
Sure, augmentin 875 pobid for 10 days

## 2013-12-28 NOTE — Telephone Encounter (Signed)
Pt called and antibiotic to pharmacy

## 2013-12-28 NOTE — Telephone Encounter (Signed)
[place]  Patient was calling to see if he could get an appointment for today, and I said dr pickard did not have anything available, would like to know if dr pickard could call in an antibiotic for a sinus infection

## 2014-03-11 ENCOUNTER — Encounter: Payer: Self-pay | Admitting: Family Medicine

## 2014-03-11 ENCOUNTER — Ambulatory Visit (INDEPENDENT_AMBULATORY_CARE_PROVIDER_SITE_OTHER): Payer: Medicare Other | Admitting: Family Medicine

## 2014-03-11 VITALS — BP 118/74 | HR 66 | Temp 97.7°F | Resp 16 | Ht 68.5 in | Wt 165.0 lb

## 2014-03-11 DIAGNOSIS — I10 Essential (primary) hypertension: Secondary | ICD-10-CM

## 2014-03-11 DIAGNOSIS — I872 Venous insufficiency (chronic) (peripheral): Secondary | ICD-10-CM

## 2014-03-11 DIAGNOSIS — I8311 Varicose veins of right lower extremity with inflammation: Secondary | ICD-10-CM

## 2014-03-11 MED ORDER — MOMETASONE FUROATE 0.1 % EX OINT: TOPICAL_OINTMENT | Freq: Every day | CUTANEOUS | Status: DC

## 2014-03-11 MED ORDER — HYDROCHLOROTHIAZIDE 25 MG PO TABS: 25.0000 mg | ORAL_TABLET | Freq: Every day | ORAL | Status: DC

## 2014-03-11 NOTE — Progress Notes (Signed)
Subjective:    Patient ID: Donald Rios, male    DOB: 08/29/1940, 74 y.o.   MRN: [id number]  HPI  Patient has an erythematous eczema-like rash on his medial right ankle and right shin. There is cracked fissures and the skin. It is a large erythematous patch extending from the medial malleolus to midway up the shin. Coincidentally the patient has +1 pitting edema in his legs and also numerous varicosities in his legs and has a long-standing history of venous insufficiency. Past Medical History  Diagnosis Date  . Asthma   . 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 Atrial enlargement, left   . Hearing loss   . Diverticulosis   . ED (erectile dysfunction)   . Hiatal hernia 05/2008    EGD with HH and reflux esophagitis.   . Gout   . Diabetes mellitus   . GERD (gastroesophageal reflux disease)   . Hyperlipidemia   . Hypertension   . History of MI (myocardial infarction)   . Ischemic cardiomyopathy     EF 45%, with inferior wall motion abnormality   . History of nuclear stress test 08/07/2010    dipyridamole; EKG negative for ischemia, low risk scan   . Valvular regurgitation     mitral and tricuspid (mild)  . Hyperplastic colon polyp 05/2008  . Acute gastric ulcer with hemorrhage 05/06/2013   Past Surgical History  Procedure Laterality Date  . Coronary artery bypass graft  2000    LIMA to LAD, free IMA to OM2, sequential graft to PLA & PLD  . Transthoracic echocardiogram  09/07/2010    EF 27-78%; LV systolic function mildly reduced; LA mildly dilated; mild-mod MR & mild-mod TR; aortic root sclerosis/calcification;   . Esophagogastroduodenoscopy N/A 05/07/2013    Procedure: ESOPHAGOGASTRODUODENOSCOPY (EGD);  Surgeon: Gatha Mayer, MD;  Location: [facility] ENDOSCOPY;  Service: Endoscopy;  Laterality: N/A;   Current Outpatient Prescriptions on File Prior to Visit  Medication Sig Dispense Refill  . allopurinol (ZYLOPRIM) 300 MG tablet Take 300 mg by mouth daily.    Marland Kitchen ALPRAZolam  (XANAX) 0.5 MG tablet Take 1 tablet (0.5 mg total) by mouth at bedtime as needed for sleep. 30 tablet 0  . amLODipine (NORVASC) 10 MG tablet Take 10 mg by mouth daily.    Marland Kitchen amoxicillin-clavulanate (AUGMENTIN) 875-125 MG per tablet Take 1 tablet by mouth 2 (two) times daily. 20 tablet 0  . aspirin 81 MG tablet Take 81 mg by mouth daily.    . benazepril (LOTENSIN) 40 MG tablet Take 40 mg by mouth daily.    . cloNIDine (CATAPRES) 0.2 MG tablet Take 0.2 mg by mouth 2 (two) times daily.    . metoprolol (LOPRESSOR) 50 MG tablet Take 75 mg by mouth 2 (two) times daily.    . pantoprazole (PROTONIX) 40 MG tablet Take 1 tablet (40 mg total) by mouth daily. 90 tablet 3  . simvastatin (ZOCOR) 40 MG tablet Take 40 mg by mouth daily.     No current facility-administered medications on file prior to visit.   No Known Allergies History   Social History  . Marital Status: Married    Spouse Name: N/A  . Number of Children: 1  . Years of Education: N/A   Occupational History  . Not on file.   Social History Main Topics  . Smoking status: Former Smoker    Quit date: 11/04/1985  . Smokeless tobacco: Never Used  . Alcohol Use: 1.2 - 1.8 oz/week  2-3 Cans of beer per week  . Drug Use: No  . Sexual Activity: Not on file   Other Topics Concern  . Not on file   Social History Narrative     Review of Systems  All other systems reviewed and are negative.      Objective:   Physical Exam  Cardiovascular: Normal rate and regular rhythm.   Pulmonary/Chest: Effort normal and breath sounds normal.  Musculoskeletal: He exhibits edema.  Skin: Skin is warm. Rash noted. There is erythema.  Vitals reviewed.         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 Replace with hydrochlorothiazide 25 mg by mouth daily. Monitor  for gout exacerbation. I will treat the rash with Elocon ointment applied daily to the rash for 2 weeks. I explained to the patient that the rash is related to the swelling and that we need to control the swelling to help prevent the rash in the future. Patient can also use compression stockings.

## 2014-04-01 ENCOUNTER — Encounter: Payer: Self-pay | Admitting: Family Medicine

## 2014-04-01 ENCOUNTER — Ambulatory Visit (INDEPENDENT_AMBULATORY_CARE_PROVIDER_SITE_OTHER): Payer: Medicare Other | Admitting: Family Medicine

## 2014-04-01 VITALS — BP 100/56 | HR 58 | Temp 97.6°F | Resp 16 | Ht 68.5 in | Wt 160.0 lb

## 2014-04-01 DIAGNOSIS — E785 Hyperlipidemia, unspecified: Secondary | ICD-10-CM

## 2014-04-01 DIAGNOSIS — E119 Type 2 diabetes mellitus without complications: Secondary | ICD-10-CM

## 2014-04-01 DIAGNOSIS — I10 Essential (primary) hypertension: Secondary | ICD-10-CM | POA: Diagnosis not present

## 2014-04-01 DIAGNOSIS — Z125 Encounter for screening for malignant neoplasm of prostate: Secondary | ICD-10-CM

## 2014-04-01 LAB — COMPLETE METABOLIC PANEL WITH GFR
AST: 12 U/L (ref 0–37)
Albumin: 4.2 g/dL (ref 3.5–5.2)
Alkaline Phosphatase: 71 U/L (ref 39–117)
BUN: 29 mg/dL — AB (ref 6–23)
CO2: 23 meq/L (ref 19–32)
CREATININE: 1.32 mg/dL (ref 0.50–1.35)
Calcium: 9.8 mg/dL (ref 8.4–10.5)
Chloride: 105 mEq/L (ref 96–112)
GFR, EST AFRICAN AMERICAN: 62 mL/min
GFR, Est Non African American: 54 mL/min — ABNORMAL LOW
Glucose, Bld: 140 mg/dL — ABNORMAL HIGH (ref 70–99)
Potassium: 4.7 mEq/L (ref 3.5–5.3)
Sodium: 140 mEq/L (ref 135–145)
Total Bilirubin: 0.8 mg/dL (ref 0.2–1.2)
Total Protein: 6.4 g/dL (ref 6.0–8.3)

## 2014-04-01 LAB — LIPID PANEL
CHOLESTEROL: 130 mg/dL (ref 0–200)
HDL: 47 mg/dL (ref >=40)
LDL Cholesterol: 70 mg/dL (ref 0–99)
Total CHOL/HDL Ratio: 2.8 Ratio
Triglycerides: 66 mg/dL (ref <150)
VLDL: 13 mg/dL (ref 0–40)

## 2014-04-01 LAB — HEMOGLOBIN A1C
Hgb A1c MFr Bld: 6 % — ABNORMAL HIGH (ref <5.7)
Mean Plasma Glucose: 126 mg/dL — ABNORMAL HIGH (ref <117)

## 2014-04-01 LAB — MICROALBUMIN, URINE: Microalb, Ur: 0.4 mg/dL (ref <2.0)

## 2014-04-01 NOTE — Progress Notes (Signed)
Subjective:    Patient ID: Donald Rios, male    DOB: 11/03/1940, 74 y.o.   MRN: [id number]  HPI  Patient is a very pleasant 74 year old white male here today for regular medical checkup. He denies any complaints. Please see my last office visit. At that time, I wanted the patient to discontinue amlodipine and replace it with hydrochlorothiazide due to some swelling he was having in his legs. He did start hydrochlorothiazide. The swelling has improved. He is also wearing compression stockings. The patient is still taking amlodipine by mistake. This may explain his low blood pressure today. He is asymptomatic with this number but this is lower than I would prefer his blood pressure to be running. He is due for PSA. The last time I checked the PSA was in October 2014. He denies any symptoms or problems. He denies any chest pain or shortness of breath or dyspnea on exertion. Diabetic foot exam is performed today and is completely normal except for a slightly diminished pulse in the left posterior tibialis. The patient denies any neuropathy. His last diabetic eye exam was in December. He is taking an aspirin every day. His blood sugars are typically running 100-115 which is higher than normal for this patient but still excellent. He did discontinue metformin after his last office visit. At the present time he is managing his sugars with diet alone. Past Medical History  Diagnosis Date  . Asthma   . 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 Atrial enlargement, left   . Hearing loss   . Diverticulosis   . ED (erectile dysfunction)   . Hiatal hernia 05/2008    EGD with HH and reflux esophagitis.   . Gout   . Diabetes mellitus   . GERD (gastroesophageal reflux disease)   . Hyperlipidemia   . Hypertension   . History of MI (myocardial infarction)   . Ischemic cardiomyopathy     EF 45%, with inferior wall motion abnormality   . History of nuclear stress test 08/07/2010    dipyridamole; EKG  negative for ischemia, low risk scan   . Valvular regurgitation     mitral and tricuspid (mild)  . Hyperplastic colon polyp 05/2008  . Acute gastric ulcer with hemorrhage 05/06/2013   Past Surgical History  Procedure Laterality Date  . Coronary artery bypass graft  2000    LIMA to LAD, free IMA to OM2, sequential graft to PLA & PLD  . Transthoracic echocardiogram  09/07/2010    EF 68-12%; LV systolic function mildly reduced; LA mildly dilated; mild-mod MR & mild-mod TR; aortic root sclerosis/calcification;   . Esophagogastroduodenoscopy N/A 05/07/2013    Procedure: ESOPHAGOGASTRODUODENOSCOPY (EGD);  Surgeon: Gatha Mayer, MD;  Location: [facility] ENDOSCOPY;  Service: Endoscopy;  Laterality: N/A;   Current Outpatient Prescriptions on File Prior to Visit  Medication Sig Dispense Refill  . allopurinol (ZYLOPRIM) 300 MG tablet Take 300 mg by mouth daily.    Marland Kitchen ALPRAZolam (XANAX) 0.5 MG tablet Take 1 tablet (0.5 mg total) by mouth at bedtime as needed for sleep. 30 tablet 0  . amLODipine (NORVASC) 10 MG tablet Take 10 mg by mouth daily.    Marland Kitchen aspirin 81 MG tablet Take 81 mg by mouth daily.    . benazepril (LOTENSIN) 40 MG tablet Take 40 mg by mouth daily.    . cloNIDine (CATAPRES) 0.2 MG tablet Take 0.2 mg by mouth 2 (two) times daily.    . hydrochlorothiazide (HYDRODIURIL) 25 MG  tablet Take 1 tablet (25 mg total) by mouth daily. 90 tablet 3  . metoprolol (LOPRESSOR) 50 MG tablet Take 75 mg by mouth 2 (two) times daily.    . mometasone (ELOCON) 0.1 % ointment Apply topically daily. 45 g 0  . pantoprazole (PROTONIX) 40 MG tablet Take 1 tablet (40 mg total) by mouth daily. 90 tablet 3  . simvastatin (ZOCOR) 40 MG tablet Take 40 mg by mouth daily.     No current facility-administered medications on file prior to visit.   No Known Allergies History   Social History  . Marital Status: Married    Spouse Name: N/A  . Number of Children: 1  . Years of Education: N/A   Occupational History  . Not on  file.   Social History Main Topics  . Smoking status: Former Smoker    Quit date: 11/04/1985  . Smokeless tobacco: Never Used  . Alcohol Use: 1.2 - 1.8 oz/week    2-3 Cans of beer per week  . Drug Use: No  . Sexual Activity: Not on file   Other Topics Concern  . Not on file   Social History Narrative     Review of Systems  All other systems reviewed and are negative.      Objective:   Physical Exam  Constitutional: He appears well-developed and well-nourished.  Neck: Neck supple. No JVD present. No thyromegaly present.  Cardiovascular: Normal rate, regular rhythm, normal heart sounds and intact distal pulses.   No murmur heard. Pulmonary/Chest: Effort normal and breath sounds normal. No respiratory distress. He has no wheezes. He has no rales. He exhibits no tenderness.  Abdominal: Soft. Bowel sounds are normal. He exhibits no distension and no mass. There is no tenderness. There is no rebound and no guarding.  Musculoskeletal: He exhibits no edema.  Lymphadenopathy:    He has no cervical adenopathy.  Skin: No rash noted.  Vitals reviewed.         Assessment & Plan:  Benign essential HTN  HLD (hyperlipidemia)  Diabetes mellitus type II, controlled - Plan: COMPLETE METABOLIC PANEL WITH GFR, Lipid panel, Hemoglobin A1c, Microalbumin, urine  Prostate cancer screening - Plan: PSA, Medicare  Discontinue amlodipine. Continue hydrochlorothiazide. Otherwise blood pressure is excellent. I will check a fasting lipid panel. Goal LDL cholesterol is less than 70 given his history of coronary artery disease. I will also check a hemoglobin A1c as well as a urine microalbumin. I'll hemoglobin A1c is less than 6.5. I will also check a PSA to update his prostate cancer screening. The remainder of his preventative care is up-to-date. His diabetic eye exam, diabetic foot exam are both up-to-date.

## 2014-04-02 ENCOUNTER — Encounter: Payer: Self-pay | Admitting: *Deleted

## 2014-04-02 LAB — PSA, MEDICARE: PSA: 1.06 ng/mL (ref <=4.00)

## 2014-04-23 ENCOUNTER — Other Ambulatory Visit: Payer: Self-pay | Admitting: Family Medicine

## 2014-04-24 NOTE — Telephone Encounter (Signed)
Refill appropriate and filled per protocol. 

## 2014-05-15 ENCOUNTER — Other Ambulatory Visit: Payer: Self-pay | Admitting: Family Medicine

## 2014-05-15 MED ORDER — PANTOPRAZOLE SODIUM 40 MG PO TBEC: 40.0000 mg | DELAYED_RELEASE_TABLET | Freq: Every day | ORAL | Status: DC

## 2014-05-20 ENCOUNTER — Telehealth: Payer: Self-pay | Admitting: Family Medicine

## 2014-05-20 NOTE — Telephone Encounter (Signed)
[phone] Pt is wanting to speak to you about his medication pantoprazole (PROTONIX) 40 MG tablet

## 2014-05-20 NOTE — Telephone Encounter (Signed)
Pt was needing a refill on the protonix - this was sent over on 05/15/14 with confirmation from pharm that they received it. If they do not have it inform pt to call us back and i will be glad to resend.

## 2014-05-22 ENCOUNTER — Other Ambulatory Visit: Payer: Self-pay | Admitting: Family Medicine

## 2014-05-22 NOTE — Telephone Encounter (Signed)
Refill appropriate and filled per protocol. 

## 2014-07-08 ENCOUNTER — Other Ambulatory Visit: Payer: Self-pay

## 2014-08-08 ENCOUNTER — Telehealth: Payer: Self-pay | Admitting: Family Medicine

## 2014-08-08 MED ORDER — CLONIDINE HCL 0.2 MG PO TABS: ORAL_TABLET | ORAL | Status: DC

## 2014-08-08 NOTE — Telephone Encounter (Signed)
[phone] Patient lost his clonidine and would like to know if he can get another rx sent to pharmacy  He is also calling pharmacy for them to call us  [facility]

## 2014-08-08 NOTE — Telephone Encounter (Signed)
Medication called/sent to requested pharmacy with ok to refill early

## 2014-10-04 ENCOUNTER — Ambulatory Visit (INDEPENDENT_AMBULATORY_CARE_PROVIDER_SITE_OTHER): Payer: Medicare Other | Admitting: Family Medicine

## 2014-10-04 ENCOUNTER — Encounter: Payer: Self-pay | Admitting: Family Medicine

## 2014-10-04 ENCOUNTER — Other Ambulatory Visit: Payer: Medicare Other

## 2014-10-04 VITALS — BP 110/62 | HR 58 | Temp 98.3°F | Resp 14 | Ht 68.5 in | Wt 162.0 lb

## 2014-10-04 DIAGNOSIS — E785 Hyperlipidemia, unspecified: Secondary | ICD-10-CM

## 2014-10-04 DIAGNOSIS — Z8679 Personal history of other diseases of the circulatory system: Secondary | ICD-10-CM

## 2014-10-04 DIAGNOSIS — Z23 Encounter for immunization: Secondary | ICD-10-CM

## 2014-10-04 DIAGNOSIS — E119 Type 2 diabetes mellitus without complications: Secondary | ICD-10-CM | POA: Diagnosis not present

## 2014-10-04 DIAGNOSIS — I10 Essential (primary) hypertension: Secondary | ICD-10-CM

## 2014-10-04 LAB — COMPLETE METABOLIC PANEL WITH GFR
ALT: 10 U/L (ref 9–46)
AST: 14 U/L (ref 10–35)
Albumin: 4.1 g/dL (ref 3.6–5.1)
Alkaline Phosphatase: 60 U/L (ref 40–115)
BILIRUBIN TOTAL: 0.8 mg/dL (ref 0.2–1.2)
BUN: 25 mg/dL (ref 7–25)
CHLORIDE: 108 mmol/L (ref 98–110)
CO2: 27 mmol/L (ref 20–31)
CREATININE: 1.31 mg/dL — AB (ref 0.70–1.18)
Calcium: 9.2 mg/dL (ref 8.6–10.3)
GFR, Est African American: 62 mL/min (ref >=60)
GFR, Est Non African American: 54 mL/min — ABNORMAL LOW (ref >=60)
Glucose, Bld: 140 mg/dL — ABNORMAL HIGH (ref 70–99)
Potassium: 4.3 mmol/L (ref 3.5–5.3)
Sodium: 141 mmol/L (ref 135–146)
Total Protein: 6.1 g/dL (ref 6.1–8.1)

## 2014-10-04 LAB — CBC WITH DIFFERENTIAL/PLATELET
BASOS ABS: 0 10*3/uL (ref 0.0–0.1)
Basophils Relative: 0 % (ref 0–1)
EOS PCT: 5 % (ref 0–5)
Eosinophils Absolute: 0.3 10*3/uL (ref 0.0–0.7)
HCT: 43.1 % (ref 39.0–52.0)
Hemoglobin: 14.3 g/dL (ref 13.0–17.0)
LYMPHS PCT: 22 % (ref 12–46)
Lymphs Abs: 1.3 10*3/uL (ref 0.7–4.0)
MCH: 31 pg (ref 26.0–34.0)
MCHC: 33.2 g/dL (ref 30.0–36.0)
MCV: 93.3 fL (ref 78.0–100.0)
MONO ABS: 0.4 10*3/uL (ref 0.1–1.0)
MONOS PCT: 7 % (ref 3–12)
MPV: 11.6 fL (ref 8.6–12.4)
Neutro Abs: 4 10*3/uL (ref 1.7–7.7)
Neutrophils Relative %: 66 % (ref 43–77)
Platelets: 188 10*3/uL (ref 150–400)
RBC: 4.62 MIL/uL (ref 4.22–5.81)
RDW: 14.8 % (ref 11.5–15.5)
WBC: 6 10*3/uL (ref 4.0–10.5)

## 2014-10-04 LAB — LIPID PANEL
Cholesterol: 125 mg/dL (ref 125–200)
HDL: 58 mg/dL (ref >=40)
LDL Cholesterol: 57 mg/dL (ref <130)
TRIGLYCERIDES: 50 mg/dL (ref <150)
Total CHOL/HDL Ratio: 2.2 Ratio (ref <=5.0)
VLDL: 10 mg/dL (ref <30)

## 2014-10-04 LAB — HEMOGLOBIN A1C
Hgb A1c MFr Bld: 6 % — ABNORMAL HIGH (ref <5.7)
Mean Plasma Glucose: 126 mg/dL — ABNORMAL HIGH (ref <117)

## 2014-10-04 MED ORDER — ALPRAZOLAM 0.5 MG PO TABS: 0.5000 mg | ORAL_TABLET | Freq: Every evening | ORAL | Status: DC | PRN

## 2014-10-04 NOTE — Addendum Note (Signed)
Addended by: Shary Decamp B on: 10/04/2014 09:20 AM   Modules accepted: Orders

## 2014-10-04 NOTE — Progress Notes (Signed)
Subjective:    Patient ID: Donald Rios, male    DOB: 09-10-1940, 74 y.o.   MRN: [id number]  HPI  04/01/14 Patient is a very pleasant 74 year old white male here today for regular medical checkup. He denies any complaints. Please see my last office visit. At that time, I wanted the patient to discontinue amlodipine and replace it with hydrochlorothiazide due to some swelling he was having in his legs. He did start hydrochlorothiazide. The swelling has improved. He is also wearing compression stockings. The patient is still taking amlodipine by mistake. This may explain his low blood pressure today. He is asymptomatic with this number but this is lower than I would prefer his blood pressure to be running. He is due for PSA. The last time I checked the PSA was in October 2014. He denies any symptoms or problems. He denies any chest pain or shortness of breath or dyspnea on exertion. Diabetic foot exam is performed today and is completely normal except for a slightly diminished pulse in the left posterior tibialis. The patient denies any neuropathy. His last diabetic eye exam was in December. He is taking an aspirin every day. His blood sugars are typically running 100-115 which is higher than normal for this patient but still excellent. He did discontinue metformin after his last office visit. At the present time he is managing his sugars with diet alone.  At that time, my plan was: Discontinue amlodipine. Continue hydrochlorothiazide. Otherwise blood pressure is excellent. I will check a fasting lipid panel. Goal LDL cholesterol is less than 70 given his history of coronary artery disease. I will also check a hemoglobin A1c as well as a urine microalbumin. I'll hemoglobin A1c is less than 6.5. I will also check a PSA to update his prostate cancer screening. The remainder of his preventative care is up-to-date. His diabetic eye exam, diabetic foot exam are both up-to-date.  10/04/14 He is here today for  follow up.  Blood pressure is running slightly low at home. Occasionally he will see his systolic blood pressures in the 80s. I'm concerned he may be overmedicated. He denies any chest pain shortness of breath or dyspnea on exertion. He denies any numbness or tingling or burning in his feet. He denies any vision changes. Diabetic eye exam was performed last November and is up-to-date. Immunizations are up-to-date. He would like to get his flu shot today in office. He denies any myalgias or right upper quadrant pain. He does work [facility] a refill on his sleeping medication. 30 Xanax has lasted this patient over a year. He uses it sparingly only when necessary.  Past Medical History  Diagnosis Date  . Asthma   . 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 Atrial enlargement, left   . Hearing loss   . Diverticulosis   . ED (erectile dysfunction)   . Hiatal hernia 05/2008    EGD with HH and reflux esophagitis.   . Gout   . Diabetes mellitus   . GERD (gastroesophageal reflux disease)   . Hyperlipidemia   . Hypertension   . History of MI (myocardial infarction)   . Ischemic cardiomyopathy     EF 45%, with inferior wall motion abnormality   . History of nuclear stress test 08/07/2010    dipyridamole; EKG negative for ischemia, low risk scan   . Valvular regurgitation     mitral and tricuspid (mild)  . Hyperplastic colon polyp 05/2008  . Acute gastric ulcer with hemorrhage  05/06/2013   Past Surgical History  Procedure Laterality Date  . Coronary artery bypass graft  2000    LIMA to LAD, free IMA to OM2, sequential graft to PLA & PLD  . Transthoracic echocardiogram  09/07/2010    EF 00-86%; LV systolic function mildly reduced; LA mildly dilated; mild-mod MR & mild-mod TR; aortic root sclerosis/calcification;   . Esophagogastroduodenoscopy N/A 05/07/2013    Procedure: ESOPHAGOGASTRODUODENOSCOPY (EGD);  Surgeon: Gatha Mayer, MD;  Location: [facility] ENDOSCOPY;  Service: Endoscopy;  Laterality: N/A;     Current Outpatient Prescriptions on File Prior to Visit  Medication Sig Dispense Refill  . allopurinol (ZYLOPRIM) 300 MG tablet TAKE 1 TABLET (300 MG TOTAL) BY MOUTH DAILY. 90 tablet 3  . ALPRAZolam (XANAX) 0.5 MG tablet Take 1 tablet (0.5 mg total) by mouth at bedtime as needed for sleep. 30 tablet 0  . amLODipine (NORVASC) 10 MG tablet TAKE 1 TABLET (10 MG TOTAL) BY MOUTH DAILY. 90 tablet 3  . aspirin 81 MG tablet Take 81 mg by mouth daily.    . benazepril (LOTENSIN) 40 MG tablet TAKE 1 TABLET (40 MG TOTAL) BY MOUTH DAILY. 90 tablet 3  . cloNIDine (CATAPRES) 0.2 MG tablet TAKE 1 TABLET (0.2 MG TOTAL) BY MOUTH 2 (TWO) TIMES DAILY. 180 tablet 3  . hydrochlorothiazide (HYDRODIURIL) 25 MG tablet Take 1 tablet (25 mg total) by mouth daily. 90 tablet 3  . metoprolol (LOPRESSOR) 50 MG tablet TAKE ONE AND ONE-HALF TABLETS BY MOUTH TWICE DAILY 270 tablet 3  . mometasone (ELOCON) 0.1 % ointment Apply topically daily. 45 g 0  . pantoprazole (PROTONIX) 40 MG tablet Take 1 tablet (40 mg total) by mouth daily. 90 tablet 3  . simvastatin (ZOCOR) 40 MG tablet TAKE 1 TABLET (40 MG TOTAL) BY MOUTH DAILY. 90 tablet 3   No current facility-administered medications on file prior to visit.   No Known Allergies Social History   Social History  . Marital Status: Married    Spouse Name: N/A  . Number of Children: 1  . Years of Education: N/A   Occupational History  . Not on file.   Social History Main Topics  . Smoking status: Former Smoker    Quit date: 11/04/1985  . Smokeless tobacco: Never Used  . Alcohol Use: 1.2 - 1.8 oz/week    2-3 Cans of beer per week  . Drug Use: No  . Sexual Activity: Not on file   Other Topics Concern  . Not on file   Social History Narrative     Review of Systems  All other systems reviewed and are negative.      Objective:   Physical Exam  Constitutional: He appears well-developed and well-nourished.  Neck: Neck supple. No JVD present. No thyromegaly  present.  Cardiovascular: Normal rate, regular rhythm, normal heart sounds and intact distal pulses.   No murmur heard. Pulmonary/Chest: Effort normal and breath sounds normal. No respiratory distress. He has no wheezes. He has no rales. He exhibits no tenderness.  Abdominal: Soft. Bowel sounds are normal. He exhibits no distension and no mass. There is no tenderness. There is no rebound and no guarding.  Musculoskeletal: He exhibits no edema.  Lymphadenopathy:    He has no cervical adenopathy.  Skin: No rash noted.  Vitals reviewed.         Assessment & Plan:  Benign essential HTN - Plan: COMPLETE METABOLIC PANEL WITH GFR, Lipid panel  HLD (hyperlipidemia) - Plan: COMPLETE METABOLIC PANEL WITH GFR,  Lipid panel  Diabetes mellitus type II, controlled - Plan: CBC with Differential/Platelet, COMPLETE METABOLIC PANEL WITH GFR, Lipid panel, Hemoglobin A1c, Microalbumin, urine  History of ASCVD  Diabetic foot exam is performed today and is normal. He will receive his flu shot today. He denies any chest pain or shortness of breath or dyspnea on exertion. There is no signs of unstable angina. Blood sugars at home are running between 100 1210 fasting and less than 152 hours postprandial. The sound excellent. Diabetic eye exam is up-to-date. Immunizations are up-to-date. I will check lab work above. Goal hemoglobin A1c is less than 6.5. Goal LDL cholesterol is less than 70. Decrease clonidine to 0.1 mg by mouth twice a day and monitor blood pressure closely

## 2014-10-05 LAB — MICROALBUMIN, URINE: Microalb, Ur: 24.9 mg/dL — ABNORMAL HIGH (ref <2.0)

## 2014-10-07 ENCOUNTER — Encounter: Payer: Self-pay | Admitting: Family Medicine

## 2014-11-06 ENCOUNTER — Telehealth: Payer: Self-pay | Admitting: Internal Medicine

## 2014-11-07 NOTE — Telephone Encounter (Signed)
Close encounter 

## 2014-11-29 ENCOUNTER — Encounter: Payer: Self-pay | Admitting: Internal Medicine

## 2014-11-29 ENCOUNTER — Ambulatory Visit (INDEPENDENT_AMBULATORY_CARE_PROVIDER_SITE_OTHER): Payer: Medicare Other | Admitting: Internal Medicine

## 2014-11-29 VITALS — BP 120/64 | HR 52 | Ht 66.0 in | Wt 163.1 lb

## 2014-11-29 DIAGNOSIS — I10 Essential (primary) hypertension: Secondary | ICD-10-CM | POA: Diagnosis not present

## 2014-11-29 DIAGNOSIS — E785 Hyperlipidemia, unspecified: Secondary | ICD-10-CM

## 2014-11-29 DIAGNOSIS — I25812 Atherosclerosis of bypass graft of coronary artery of transplanted heart without angina pectoris: Secondary | ICD-10-CM | POA: Diagnosis not present

## 2014-11-29 NOTE — Progress Notes (Signed)
OFFICE NOTE  Chief Complaint:  Routine followup  Primary Care Physician: Odette Fraction, MD  HPI:  Donald Rios  is a pleasant 74 year old gentleman who has a history of CABG in 2000 with a LIMA to the LAD, free IMA to the OM2 and sequential graft to the PLA and PLD. He has done pretty well. He had a prior MI so his EF is about 45-50% with inferior wall motion abnormality. He has got very borderline diabetes and has lost some weight and is taking low-dose metformin but is really not bothered by that. His cholesterol profile actually is excellent. I did obtain recent laboratory work including a CMP that showed an A1c of 5.6, down from 6.0 in January. NMR lipid profile showed LDL particle number of 1035 which is near goal, a calculated LDLC of 79, HDL 44, triglycerides 57, overall a very favorable profile and shows excellent control. He did reduce his dose of simvastatin to 40 mg daily and has noted a marked difference in muscle aches. Otherwise he denies any chest pain, worsening shortness of breath, palpitations, presyncope or syncopal symptoms.  Donald Rios returns today for follow-up. Overall he seems to be doing very well. Last year we performed a nuclear stress test which showed no evidence of ischemia but there was a fixed inferior defect and EF was 49%. He denies any angina and stays fairly active. Blood pressure control is been excellent. He had a repeat cholesterol profile recently which is very favorable with LDL around 50 and total cholesterol about 120. He reports no side effects on pravastatin. He is on daily aspirin.  PMHx:  Past Medical History  Diagnosis Date  . Asthma   . 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 Atrial enlargement, left   . Hearing loss   . Diverticulosis   . ED (erectile dysfunction)   . Hiatal hernia 05/2008    EGD with HH and reflux esophagitis.   . Gout   . Diabetes mellitus   . GERD (gastroesophageal reflux disease)   . Hyperlipidemia    . Hypertension   . History of MI (myocardial infarction)   . Ischemic cardiomyopathy     EF 45%, with inferior wall motion abnormality   . History of nuclear stress test 08/07/2010    dipyridamole; EKG negative for ischemia, low risk scan   . Valvular regurgitation     mitral and tricuspid (mild)  . Hyperplastic colon polyp 05/2008  . Acute gastric ulcer with hemorrhage 05/06/2013  . Diabetic nephropathy [facility])     Past Surgical History  Procedure Laterality Date  . Coronary artery bypass graft  2000    LIMA to LAD, free IMA to OM2, sequential graft to PLA & PLD  . Transthoracic echocardiogram  09/07/2010    EF [id number]; LV systolic function mildly reduced; LA mildly dilated; mild-mod MR & mild-mod TR; aortic root sclerosis/calcification;   . Esophagogastroduodenoscopy N/A 05/07/2013    Procedure: ESOPHAGOGASTRODUODENOSCOPY (EGD);  Surgeon: Gatha Mayer, MD;  Location: [facility] ENDOSCOPY;  Service: Endoscopy;  Laterality: N/A;    FAMHx:  Family History  Problem Relation Age of Onset  . Stroke Father   . Hypertension Father   . Stroke Maternal Grandmother   . Stroke Maternal Grandfather   . Diabetes Brother   . Esophageal cancer Brother   . Stomach cancer Maternal Aunt     SOCHx:   reports that he quit smoking about 29 years ago. He has never used smokeless tobacco.  He reports that he drinks about 1.2 - 1.8 oz of alcohol per week. He reports that he does not use illicit drugs.  ALLERGIES:  No Known Allergies  ROS: A comprehensive review of systems was negative.  HOME MEDS: Current Outpatient Prescriptions  Medication Sig Dispense Refill  . allopurinol (ZYLOPRIM) 300 MG tablet TAKE 1 TABLET (300 MG TOTAL) BY MOUTH DAILY. 90 tablet 3  . ALPRAZolam (XANAX) 0.5 MG tablet Take 1 tablet (0.5 mg total) by mouth at bedtime as needed for sleep. 30 tablet 0  . aspirin 81 MG tablet Take 81 mg by mouth daily.    . benazepril (LOTENSIN) 40 MG tablet TAKE 1 TABLET (40 MG TOTAL) BY MOUTH  DAILY. 90 tablet 3  . cloNIDine (CATAPRES) 0.2 MG tablet TAKE 1 TABLET (0.2 MG TOTAL) BY MOUTH 2 (TWO) TIMES DAILY. 180 tablet 3  . hydrochlorothiazide (HYDRODIURIL) 25 MG tablet Take 1 tablet (25 mg total) by mouth daily. 90 tablet 3  . metoprolol (LOPRESSOR) 50 MG tablet TAKE ONE AND ONE-HALF TABLETS BY MOUTH TWICE DAILY 270 tablet 3  . simvastatin (ZOCOR) 40 MG tablet TAKE 1 TABLET (40 MG TOTAL) BY MOUTH DAILY. 90 tablet 3   No current facility-administered medications for this visit.    LABS/IMAGING: No results found for this or any previous visit (from the past 48 hour(s)). No results found.  VITALS: BP 120/64 mmHg  Pulse 52  Ht 5\' 6"  (1.676 m)  Wt 163 lb 1.6 oz (73.982 kg)  BMI 26.34 kg/m2  EXAM: General appearance: alert and no distress Neck: no adenopathy, no carotid bruit, no JVD, supple, symmetrical, trachea midline and thyroid not enlarged, symmetric, no tenderness/mass/nodules Lungs: clear to auscultation bilaterally Heart: regular rate and rhythm, S1, S2 normal, no murmur, click, rub or gallop Abdomen: soft, non-tender; bowel sounds normal; no masses,  no organomegaly Extremities: extremities normal, atraumatic, no cyanosis or edema Pulses: 2+ and symmetric Skin: Skin color, texture, turgor normal. No rashes or lesions Neurologic: Grossly normal  EKG: Sinus bradycardia 52, inferior infarct with ST and T wave abnormalities laterally (unchanged from prior EKG)  ASSESSMENT: 1. Coronary disease status post three-vessel CABG in 2000 2. History of inferior MI 3. Ischemic cardiomyopathy EF 49% 4. Hypertension 5. Dyslipidemia 6. Diabetes type 2 with a recent A1c of 6.0 (diet controlled)  PLAN: 1.   Donald Rios is doing very well and continues to exercise several times a week. He is asymptomatic and his nuclear stress test last year was reassuring without any evidence of ischemia but there is an inferior infarct which is evident on his EKG. Blood pressure is  well-controlled. His cholesterol is excellent. Overall I feel like he is at very low risk and continues to do well. We'll plan to see him back annually or sooner as necessary. No changes to his medicines at this time.  Pixie Casino, MD, [facility] Attending Cardiologist [facility] 11/29/2014, 8:24 AM

## 2014-11-29 NOTE — Patient Instructions (Signed)
Your physician wants you to follow-up in: 1 year with Dr. Hilty. You will receive a reminder letter in the mail two months in advance. If you don't receive a letter, please call our office to schedule the follow-up appointment.  

## 2015-01-20 DIAGNOSIS — E119 Type 2 diabetes mellitus without complications: Secondary | ICD-10-CM | POA: Diagnosis not present

## 2015-01-20 LAB — HM DIABETES EYE EXAM

## 2015-01-27 ENCOUNTER — Encounter: Payer: Self-pay | Admitting: Family Medicine

## 2015-02-20 ENCOUNTER — Other Ambulatory Visit: Payer: Self-pay | Admitting: Family Medicine

## 2015-03-18 ENCOUNTER — Encounter: Payer: Self-pay | Admitting: *Deleted

## 2015-03-21 ENCOUNTER — Ambulatory Visit: Payer: Medicare Other | Admitting: Family Medicine

## 2015-03-24 ENCOUNTER — Encounter: Payer: Self-pay | Admitting: Family Medicine

## 2015-03-31 ENCOUNTER — Ambulatory Visit (INDEPENDENT_AMBULATORY_CARE_PROVIDER_SITE_OTHER): Payer: Medicare Other | Admitting: Family Medicine

## 2015-03-31 ENCOUNTER — Encounter: Payer: Self-pay | Admitting: Family Medicine

## 2015-03-31 VITALS — BP 112/60 | HR 62 | Temp 98.4°F | Resp 16 | Ht 68.0 in | Wt 166.0 lb

## 2015-03-31 DIAGNOSIS — Z125 Encounter for screening for malignant neoplasm of prostate: Secondary | ICD-10-CM | POA: Diagnosis not present

## 2015-03-31 DIAGNOSIS — Z23 Encounter for immunization: Secondary | ICD-10-CM

## 2015-03-31 DIAGNOSIS — R0989 Other specified symptoms and signs involving the circulatory and respiratory systems: Secondary | ICD-10-CM

## 2015-03-31 DIAGNOSIS — E785 Hyperlipidemia, unspecified: Secondary | ICD-10-CM | POA: Diagnosis not present

## 2015-03-31 DIAGNOSIS — Z8679 Personal history of other diseases of the circulatory system: Secondary | ICD-10-CM

## 2015-03-31 DIAGNOSIS — I10 Essential (primary) hypertension: Secondary | ICD-10-CM | POA: Diagnosis not present

## 2015-03-31 DIAGNOSIS — E119 Type 2 diabetes mellitus without complications: Secondary | ICD-10-CM | POA: Diagnosis not present

## 2015-03-31 LAB — CBC WITH DIFFERENTIAL/PLATELET
BASOS PCT: 0 % (ref 0–1)
Basophils Absolute: 0 10*3/uL (ref 0.0–0.1)
EOS ABS: 0.3 10*3/uL (ref 0.0–0.7)
Eosinophils Relative: 5 % (ref 0–5)
HCT: 44 % (ref 39.0–52.0)
HEMOGLOBIN: 14.3 g/dL (ref 13.0–17.0)
LYMPHS ABS: 1.7 10*3/uL (ref 0.7–4.0)
Lymphocytes Relative: 30 % (ref 12–46)
MCH: 30.6 pg (ref 26.0–34.0)
MCHC: 32.5 g/dL (ref 30.0–36.0)
MCV: 94.2 fL (ref 78.0–100.0)
MONO ABS: 0.4 10*3/uL (ref 0.1–1.0)
MONOS PCT: 8 % (ref 3–12)
MPV: 11.7 fL (ref 8.6–12.4)
Neutro Abs: 3.2 10*3/uL (ref 1.7–7.7)
Neutrophils Relative %: 57 % (ref 43–77)
Platelets: 192 10*3/uL (ref 150–400)
RBC: 4.67 MIL/uL (ref 4.22–5.81)
RDW: 13.9 % (ref 11.5–15.5)
WBC: 5.6 10*3/uL (ref 4.0–10.5)

## 2015-03-31 LAB — COMPLETE METABOLIC PANEL WITH GFR
ALBUMIN: 3.9 g/dL (ref 3.6–5.1)
ALK PHOS: 50 U/L (ref 40–115)
ALT: 9 U/L (ref 9–46)
AST: 13 U/L (ref 10–35)
BILIRUBIN TOTAL: 0.8 mg/dL (ref 0.2–1.2)
BUN: 27 mg/dL — ABNORMAL HIGH (ref 7–25)
CO2: 28 mmol/L (ref 20–31)
Calcium: 8.8 mg/dL (ref 8.6–10.3)
Chloride: 105 mmol/L (ref 98–110)
Creat: 1.22 mg/dL — ABNORMAL HIGH (ref 0.70–1.18)
GFR, EST AFRICAN AMERICAN: 68 mL/min (ref >=60)
GFR, EST NON AFRICAN AMERICAN: 58 mL/min — AB (ref >=60)
GLUCOSE: 130 mg/dL — AB (ref 70–99)
POTASSIUM: 4.4 mmol/L (ref 3.5–5.3)
SODIUM: 141 mmol/L (ref 135–146)
Total Protein: 5.9 g/dL — ABNORMAL LOW (ref 6.1–8.1)

## 2015-03-31 LAB — HEMOGLOBIN A1C
HEMOGLOBIN A1C: 6 % — AB (ref <5.7)
Mean Plasma Glucose: 126 mg/dL — ABNORMAL HIGH (ref <117)

## 2015-03-31 LAB — LIPID PANEL
CHOL/HDL RATIO: 2.3 ratio (ref <=5.0)
CHOLESTEROL: 123 mg/dL — AB (ref 125–200)
HDL: 54 mg/dL (ref >=40)
LDL Cholesterol: 60 mg/dL (ref <130)
TRIGLYCERIDES: 47 mg/dL (ref <150)
VLDL: 9 mg/dL (ref <30)

## 2015-03-31 MED ORDER — ALPRAZOLAM 0.5 MG PO TABS: 0.5000 mg | ORAL_TABLET | Freq: Every evening | ORAL | Status: DC | PRN

## 2015-03-31 NOTE — Progress Notes (Signed)
Subjective:    Patient ID: Donald Rios, male    DOB: Nov 25, 1940, 75 y.o.   MRN: [id number]  HPI   He is here today for follow up.  He denies any chest pain shortness of breath or dyspnea on exertion. He denies any numbness or tingling or burning in his feet. He denies any vision changes. Diabetic eye exam was performed last November and is up-to-date. Immunizations are up-to-date. He would like to get his flu shot today in office. He denies any myalgias or right upper quadrant pain. He does work quest a refill on his sleeping medication. 30 Xanax has lasted this patient over a year. He uses it sparingly only when necessary. He reports fasting blood sugars less than 120 in the morning. Two-hour postprandial sugars are less than 140. He denies any hypoglycemic episodes. He is due for a booster on Pneumovax 23 along with a prostate exam/PSA. He would like to proceed with a PSA today. On examination he has a coincidental finding of a right carotid bruit Past Medical History  Diagnosis Date  . Asthma   . 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 Atrial enlargement, left   . Hearing loss   . Diverticulosis   . ED (erectile dysfunction)   . Hiatal hernia 05/2008    EGD with HH and reflux esophagitis.   . Gout   . Diabetes mellitus   . GERD (gastroesophageal reflux disease)   . Hyperlipidemia   . Hypertension   . History of MI (myocardial infarction)   . Ischemic cardiomyopathy     EF 45%, with inferior wall motion abnormality   . History of nuclear stress test 08/07/2010    dipyridamole; EKG negative for ischemia, low risk scan   . Valvular regurgitation     mitral and tricuspid (mild)  . Hyperplastic colon polyp 05/2008  . Acute gastric ulcer with hemorrhage 05/06/2013  . Diabetic nephropathy [facility])    Past Surgical History  Procedure Laterality Date  . Coronary artery bypass graft  2000    LIMA to LAD, free IMA to OM2, sequential graft to PLA & PLD  . Transthoracic  echocardiogram  09/07/2010    EF Q000111Q; LV systolic function mildly reduced; LA mildly dilated; mild-mod MR & mild-mod TR; aortic root sclerosis/calcification;   . Esophagogastroduodenoscopy N/A 05/07/2013    Procedure: ESOPHAGOGASTRODUODENOSCOPY (EGD);  Surgeon: Gatha Mayer, MD;  Location: [facility] ENDOSCOPY;  Service: Endoscopy;  Laterality: N/A;   Current Outpatient Prescriptions on File Prior to Visit  Medication Sig Dispense Refill  . allopurinol (ZYLOPRIM) 300 MG tablet TAKE 1 TABLET (300 MG TOTAL) BY MOUTH DAILY. 90 tablet 3  . aspirin 81 MG tablet Take 81 mg by mouth daily.    . benazepril (LOTENSIN) 40 MG tablet TAKE 1 TABLET (40 MG TOTAL) BY MOUTH DAILY. 90 tablet 3  . cloNIDine (CATAPRES) 0.2 MG tablet TAKE 1 TABLET (0.2 MG TOTAL) BY MOUTH 2 (TWO) TIMES DAILY. 180 tablet 3  . hydrochlorothiazide (HYDRODIURIL) 25 MG tablet TAKE 1 TABLET (25 MG TOTAL) BY MOUTH DAILY. 90 tablet 4  . metoprolol (LOPRESSOR) 50 MG tablet TAKE ONE AND ONE-HALF TABLETS BY MOUTH TWICE DAILY 270 tablet 3  . simvastatin (ZOCOR) 40 MG tablet TAKE 1 TABLET (40 MG TOTAL) BY MOUTH DAILY. 90 tablet 3   No current facility-administered medications on file prior to visit.   No Known Allergies Social History   Social History  . Marital Status: Married  Spouse Name: N/A  . Number of Children: 1  . Years of Education: N/A   Occupational History  . Not on file.   Social History Main Topics  . Smoking status: Former Smoker    Quit date: 11/04/1985  . Smokeless tobacco: Never Used  . Alcohol Use: 1.2 - 1.8 oz/week    2-3 Cans of beer per week  . Drug Use: No  . Sexual Activity: Not on file   Other Topics Concern  . Not on file   Social History Narrative     Review of Systems  All other systems reviewed and are negative.      Objective:   Physical Exam  Constitutional: He appears well-developed and well-nourished.  Neck: Neck supple. No JVD present. No thyromegaly present.  Cardiovascular:  Normal rate, regular rhythm, normal heart sounds and intact distal pulses.   No murmur heard. Pulmonary/Chest: Effort normal and breath sounds normal. No respiratory distress. He has no wheezes. He has no rales. He exhibits no tenderness.  Abdominal: Soft. Bowel sounds are normal. He exhibits no distension and no mass. There is no tenderness. There is no rebound and no guarding.  Musculoskeletal: He exhibits no edema.  Lymphadenopathy:    He has no cervical adenopathy.  Skin: No rash noted.  Vitals reviewed.         Assessment & Plan:  Benign essential HTN  HLD (hyperlipidemia)  Controlled type 2 diabetes mellitus without complication, without long-term current use of insulin ([place]) - Plan: CBC with Differential/Platelet, COMPLETE METABOLIC PANEL WITH GFR, Lipid panel, Microalbumin, urine, Hemoglobin A1c  History of ASCVD  Prostate cancer screening - Plan: PSA  Right carotid bruit - Plan: Carotid  I'm very happy with his blood pressure. I will make no changes in his blood pressure medication. I will check a fasting lipid panel. Goal LDL cholesterol is less than 70. I will check a CBC, CMP, fasting lipid panel, urine microalbumin, and hemoglobin A1c. However his blood sugars sound well controlled. I will also check a PSA while withdrawing lab work to screen for prostate cancer. I will schedule the patient for carotid Doppler given the right carotid bruit that I have not appreciated on exam but is there today.

## 2015-03-31 NOTE — Addendum Note (Signed)
Addended by: Shary Decamp B on: 03/31/2015 09:32 AM   Modules accepted: Orders

## 2015-04-01 ENCOUNTER — Encounter: Payer: Self-pay | Admitting: Family Medicine

## 2015-04-01 LAB — PSA: PSA: 0.98 ng/mL (ref <=4.00)

## 2015-04-01 LAB — MICROALBUMIN, URINE: MICROALB UR: 0.3 mg/dL

## 2015-04-11 ENCOUNTER — Ambulatory Visit (HOSPITAL_COMMUNITY)
Admission: RE | Admit: 2015-04-11 | Discharge: 2015-04-11 | Disposition: A | Discharge status: Home / Self Care | Payer: Medicare Other | Source: Ambulatory Visit | Attending: Family Medicine | Admitting: Family Medicine

## 2015-04-11 DIAGNOSIS — I10 Essential (primary) hypertension: Secondary | ICD-10-CM | POA: Diagnosis not present

## 2015-04-11 DIAGNOSIS — I255 Ischemic cardiomyopathy: Secondary | ICD-10-CM | POA: Insufficient documentation

## 2015-04-11 DIAGNOSIS — K219 Gastro-esophageal reflux disease without esophagitis: Secondary | ICD-10-CM | POA: Diagnosis not present

## 2015-04-11 DIAGNOSIS — R0989 Other specified symptoms and signs involving the circulatory and respiratory systems: Secondary | ICD-10-CM

## 2015-04-11 DIAGNOSIS — I6523 Occlusion and stenosis of bilateral carotid arteries: Secondary | ICD-10-CM | POA: Insufficient documentation

## 2015-04-11 DIAGNOSIS — E1140 Type 2 diabetes mellitus with diabetic neuropathy, unspecified: Secondary | ICD-10-CM | POA: Insufficient documentation

## 2015-04-11 DIAGNOSIS — E785 Hyperlipidemia, unspecified: Secondary | ICD-10-CM | POA: Diagnosis not present

## 2015-04-15 ENCOUNTER — Encounter: Payer: Self-pay | Admitting: Family Medicine

## 2015-04-29 ENCOUNTER — Encounter: Payer: Self-pay | Admitting: Family Medicine

## 2015-05-20 ENCOUNTER — Other Ambulatory Visit: Payer: Self-pay | Admitting: *Deleted

## 2015-05-20 MED ORDER — SIMVASTATIN 40 MG PO TABS: ORAL_TABLET | ORAL | Status: DC

## 2015-05-20 MED ORDER — ALLOPURINOL 300 MG PO TABS: ORAL_TABLET | ORAL | Status: DC

## 2015-05-20 MED ORDER — BENAZEPRIL HCL 40 MG PO TABS: ORAL_TABLET | ORAL | Status: DC

## 2015-05-20 MED ORDER — METOPROLOL TARTRATE 50 MG PO TABS: 75.0000 mg | ORAL_TABLET | Freq: Two times a day (BID) | ORAL | Status: DC

## 2015-05-20 NOTE — Telephone Encounter (Signed)
Received fax requesting refill on routine medications.   Refill appropriate and filled per protocol.  

## 2015-06-05 ENCOUNTER — Telehealth: Payer: Self-pay | Admitting: Family Medicine

## 2015-06-05 NOTE — Telephone Encounter (Signed)
Requesting a refill on Xanax - Ok to refill??       

## 2015-06-06 MED ORDER — ALPRAZOLAM 0.5 MG PO TABS: 0.5000 mg | ORAL_TABLET | Freq: Every evening | ORAL | Status: DC | PRN

## 2015-06-06 NOTE — Telephone Encounter (Signed)
Medication called/sent to requested pharmacy  

## 2015-06-06 NOTE — Telephone Encounter (Signed)
ok 

## 2015-06-09 ENCOUNTER — Other Ambulatory Visit: Payer: Self-pay | Admitting: Family Medicine

## 2015-09-10 ENCOUNTER — Telehealth: Payer: Self-pay | Admitting: *Deleted

## 2015-09-10 NOTE — Telephone Encounter (Signed)
Received fax requesting refill on Xanax.   Ok to refill??  Last office visit 03/31/2015.  Last refill 06/06/2015, #2 refills.

## 2015-09-11 MED ORDER — ALPRAZOLAM 0.5 MG PO TABS: 0.5000 mg | ORAL_TABLET | Freq: Every evening | ORAL | 2 refills | Status: DC | PRN

## 2015-09-11 NOTE — Telephone Encounter (Signed)
Medication called to pharmacy. 

## 2015-09-11 NOTE — Telephone Encounter (Signed)
ok 

## 2015-10-03 ENCOUNTER — Ambulatory Visit (INDEPENDENT_AMBULATORY_CARE_PROVIDER_SITE_OTHER): Payer: Medicare Other | Admitting: Family Medicine

## 2015-10-03 ENCOUNTER — Encounter: Payer: Self-pay | Admitting: Family Medicine

## 2015-10-03 VITALS — BP 126/64 | HR 58 | Temp 98.6°F | Resp 16 | Ht 68.5 in | Wt 167.0 lb

## 2015-10-03 DIAGNOSIS — I10 Essential (primary) hypertension: Secondary | ICD-10-CM | POA: Diagnosis not present

## 2015-10-03 DIAGNOSIS — E785 Hyperlipidemia, unspecified: Secondary | ICD-10-CM

## 2015-10-03 DIAGNOSIS — Z23 Encounter for immunization: Secondary | ICD-10-CM | POA: Diagnosis not present

## 2015-10-03 DIAGNOSIS — E119 Type 2 diabetes mellitus without complications: Secondary | ICD-10-CM | POA: Diagnosis not present

## 2015-10-03 DIAGNOSIS — Z8679 Personal history of other diseases of the circulatory system: Secondary | ICD-10-CM

## 2015-10-03 LAB — CBC WITH DIFFERENTIAL/PLATELET
BASOS ABS: 0 {cells}/uL (ref 0–200)
BASOS PCT: 0 %
EOS ABS: 335 {cells}/uL (ref 15–500)
Eosinophils Relative: 5 %
HCT: 41.7 % (ref 38.5–50.0)
Hemoglobin: 13.8 g/dL (ref 13.0–17.0)
LYMPHS PCT: 20 %
Lymphs Abs: 1340 cells/uL (ref 850–3900)
MCH: 31.1 pg (ref 27.0–33.0)
MCHC: 33.1 g/dL (ref 32.0–36.0)
MCV: 93.9 fL (ref 80.0–100.0)
MONOS PCT: 7 %
MPV: 11.5 fL (ref 7.5–12.5)
Monocytes Absolute: 469 cells/uL (ref 200–950)
NEUTROS PCT: 68 %
Neutro Abs: 4556 cells/uL (ref 1500–7800)
PLATELETS: 173 10*3/uL (ref 140–400)
RBC: 4.44 MIL/uL (ref 4.20–5.80)
RDW: 14.1 % (ref 11.0–15.0)
WBC: 6.7 10*3/uL (ref 3.8–10.8)

## 2015-10-03 NOTE — Progress Notes (Signed)
Subjective:    Patient ID: Donald Rios, male    DOB: May 11, 1940, 75 y.o.   MRN: [id number]  HPI 03/31/15 He is here today for follow up.  He denies any chest pain shortness of breath or dyspnea on exertion. He denies any numbness or tingling or burning in his feet. He denies any vision changes. Diabetic eye exam was performed last November and is up-to-date. Immunizations are up-to-date. He would like to get his flu shot today in office. He denies any myalgias or right upper quadrant pain. He does work quest a refill on his sleeping medication. 30 Xanax has lasted this patient over a year. He uses it sparingly only when necessary. He reports fasting blood sugars less than 120 in the morning. Two-hour postprandial sugars are less than 140. He denies any hypoglycemic episodes. He is due for a booster on Pneumovax 23 along with a prostate exam/PSA. He would like to proceed with a PSA today. On examination he has a coincidental finding of a right carotid bruit.  At that time, my plan was: I'm very happy with his blood pressure. I will make no changes in his blood pressure medication. I will check a fasting lipid panel. Goal LDL cholesterol is less than 70. I will check a CBC, CMP, fasting lipid panel, urine microalbumin, and hemoglobin A1c. However his blood sugars sound well controlled. I will also check a PSA while withdrawing lab work to screen for prostate cancer. I will schedule the patient for carotid Doppler given the right carotid bruit that I have not appreciated on exam but is there today.  10/03/15 Patient is here today for follow-up.  He would like to receive his flu shot. He denies any chest pain shortness of breath or dyspnea on exertion. At his last visit, I appreciated a right carotid bruit. Today I cannot hear the bruit as well. Fortunately his carotid Doppler showed no significant blockage. He denies any myalgias or right upper quadrant pain. He denies any hyperglycemia or hypoglycemia.  His blood pressure today is well controlled. He denies any orthostatic dizziness. He is due for fasting lab work  Past Medical History:  Diagnosis Date  . Acute gastric ulcer with hemorrhage 05/06/2013  . Asthma   . Atrial enlargement, left   . 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 Diabetes mellitus   . Diabetic nephropathy ([place])   . Diverticulosis   . ED (erectile dysfunction)   . GERD (gastroesophageal reflux disease)   . Gout   . Hearing loss   . Hiatal hernia 05/2008   EGD with HH and reflux esophagitis.   Marland Kitchen History of MI (myocardial infarction)   . History of nuclear stress test 08/07/2010   dipyridamole; EKG negative for ischemia, low risk scan   . Hyperlipidemia   . Hyperplastic colon polyp 05/2008  . Hypertension   . Ischemic cardiomyopathy    EF 45%, with inferior wall motion abnormality   . Valvular regurgitation    mitral and tricuspid (mild)   Past Surgical History:  Procedure Laterality Date  . CORONARY ARTERY BYPASS GRAFT  2000   LIMA to LAD, free IMA to OM2, sequential graft to PLA & PLD  . ESOPHAGOGASTRODUODENOSCOPY N/A 05/07/2013   Procedure: ESOPHAGOGASTRODUODENOSCOPY (EGD);  Surgeon: Gatha Mayer, MD;  Location: [facility] ENDOSCOPY;  Service: Endoscopy;  Laterality: N/A;  . TRANSTHORACIC ECHOCARDIOGRAM  09/07/2010   EF Q000111Q; LV systolic function mildly reduced; LA mildly dilated; mild-mod MR & mild-mod TR;  aortic root sclerosis/calcification;    Current Outpatient Prescriptions on File Prior to Visit  Medication Sig Dispense Refill  . allopurinol (ZYLOPRIM) 300 MG tablet TAKE 1 TABLET (300 MG TOTAL) BY MOUTH DAILY. 90 tablet 3  . ALPRAZolam (XANAX) 0.5 MG tablet Take 1 tablet (0.5 mg total) by mouth at bedtime as needed for sleep. 30 tablet 2  . aspirin 81 MG tablet Take 81 mg by mouth daily.    . benazepril (LOTENSIN) 40 MG tablet TAKE 1 TABLET (40 MG TOTAL) BY MOUTH DAILY. 90 tablet 3  . cloNIDine (CATAPRES) 0.2 MG tablet TAKE 1 TABLET (0.2 MG TOTAL)  BY MOUTH 2 (TWO) TIMES DAILY. 180 tablet 3  . hydrochlorothiazide (HYDRODIURIL) 25 MG tablet TAKE 1 TABLET (25 MG TOTAL) BY MOUTH DAILY. 90 tablet 4  . metoprolol (LOPRESSOR) 50 MG tablet Take 1.5 tablets (75 mg total) by mouth 2 (two) times daily. 270 tablet 3  . pantoprazole (PROTONIX) 40 MG tablet TAKE 1 TABLET (40 MG TOTAL) BY MOUTH DAILY. 90 tablet 4  . simvastatin (ZOCOR) 40 MG tablet TAKE 1 TABLET (40 MG TOTAL) BY MOUTH DAILY. 90 tablet 3   No current facility-administered medications on file prior to visit.    No Known Allergies Social History   Social History  . Marital status: Married    Spouse name: N/A  . Number of children: 1  . Years of education: N/A   Occupational History  . Not on file.   Social History Main Topics  . Smoking status: Former Smoker    Quit date: 11/04/1985  . Smokeless tobacco: Never Used  . Alcohol use 1.2 - 1.8 oz/week    2 - 3 Cans of beer per week  . Drug use: No  . Sexual activity: Not on file   Other Topics Concern  . Not on file   Social History Narrative  . No narrative on file     Review of Systems  All other systems reviewed and are negative.      Objective:   Physical Exam  Constitutional: He appears well-developed and well-nourished.  Neck: Neck supple. No JVD present. No thyromegaly present.  Cardiovascular: Normal rate, regular rhythm, normal heart sounds and intact distal pulses.   No murmur heard. Pulmonary/Chest: Effort normal and breath sounds normal. No respiratory distress. He has no wheezes. He has no rales. He exhibits no tenderness.  Abdominal: Soft. Bowel sounds are normal. He exhibits no distension and no mass. There is no tenderness. There is no rebound and no guarding.  Musculoskeletal: He exhibits no edema.  Lymphadenopathy:    He has no cervical adenopathy.  Skin: No rash noted.  Vitals reviewed.         Assessment & Plan:  Benign essential HTN  HLD (hyperlipidemia)  Controlled type 2  diabetes mellitus without complication, without long-term current use of insulin ([facility]) - Plan: CBC with Differential/Platelet, COMPLETE METABOLIC PANEL WITH GFR, Lipid panel, Hemoglobin A1c, Microalbumin, urine  History of ASCVD  Blood pressure is excellent.  Check fasting lipid panel goal LDL cholesterol is less than 70 check hemoglobin A1c. Goal hemoglobin A1c is less than 6.5. Patient will receive his flu shot today. Marland Kitchen

## 2015-10-03 NOTE — Addendum Note (Signed)
Addended by: Shary Decamp B on: 10/03/2015 11:40 AM   Modules accepted: Orders

## 2015-10-04 LAB — COMPLETE METABOLIC PANEL WITH GFR
ALT: 8 U/L — ABNORMAL LOW (ref 9–46)
AST: 15 U/L (ref 10–35)
Albumin: 3.8 g/dL (ref 3.6–5.1)
Alkaline Phosphatase: 48 U/L (ref 40–115)
BUN: 25 mg/dL (ref 7–25)
CHLORIDE: 106 mmol/L (ref 98–110)
CO2: 25 mmol/L (ref 20–31)
Calcium: 9.2 mg/dL (ref 8.6–10.3)
Creat: 1.14 mg/dL (ref 0.70–1.18)
GFR, EST AFRICAN AMERICAN: 73 mL/min (ref >=60)
GFR, EST NON AFRICAN AMERICAN: 63 mL/min (ref >=60)
Glucose, Bld: 133 mg/dL — ABNORMAL HIGH (ref 70–99)
Potassium: 4.4 mmol/L (ref 3.5–5.3)
Sodium: 142 mmol/L (ref 135–146)
Total Bilirubin: 1.2 mg/dL (ref 0.2–1.2)
Total Protein: 5.6 g/dL — ABNORMAL LOW (ref 6.1–8.1)

## 2015-10-04 LAB — LIPID PANEL
Cholesterol: 106 mg/dL — ABNORMAL LOW (ref 125–200)
HDL: 50 mg/dL
LDL Cholesterol: 46 mg/dL
Total CHOL/HDL Ratio: 2.1 ratio
Triglycerides: 49 mg/dL
VLDL: 10 mg/dL

## 2015-10-04 LAB — MICROALBUMIN, URINE: Microalb, Ur: 0.2 mg/dL

## 2015-10-04 LAB — HEMOGLOBIN A1C
Hgb A1c MFr Bld: 5.8 % — ABNORMAL HIGH
Mean Plasma Glucose: 120 mg/dL

## 2015-10-06 ENCOUNTER — Encounter: Payer: Self-pay | Admitting: Family Medicine

## 2015-10-18 ENCOUNTER — Other Ambulatory Visit: Payer: Self-pay | Admitting: Family Medicine

## 2015-12-17 ENCOUNTER — Telehealth: Payer: Self-pay | Admitting: Family Medicine

## 2015-12-17 NOTE — Telephone Encounter (Signed)
Requesting a refill on Xanax - Ok to refill??       

## 2015-12-18 ENCOUNTER — Other Ambulatory Visit: Payer: Self-pay | Admitting: Family Medicine

## 2015-12-18 NOTE — Telephone Encounter (Signed)
ok 

## 2015-12-19 ENCOUNTER — Ambulatory Visit: Payer: Medicare Other | Admitting: Internal Medicine

## 2015-12-19 ENCOUNTER — Other Ambulatory Visit: Payer: Self-pay

## 2015-12-19 MED ORDER — ALPRAZOLAM 0.5 MG PO TABS: 0.5000 mg | ORAL_TABLET | Freq: Every evening | ORAL | 2 refills | Status: DC | PRN

## 2015-12-19 NOTE — Telephone Encounter (Signed)
Medication called/sent to requested pharmacy  

## 2015-12-23 ENCOUNTER — Ambulatory Visit: Payer: Medicare Other | Admitting: Internal Medicine

## 2015-12-23 ENCOUNTER — Encounter: Payer: Self-pay | Admitting: Internal Medicine

## 2015-12-23 ENCOUNTER — Ambulatory Visit (INDEPENDENT_AMBULATORY_CARE_PROVIDER_SITE_OTHER): Payer: Medicare Other | Admitting: Internal Medicine

## 2015-12-23 VITALS — BP 130/62 | HR 51 | Ht 68.5 in | Wt 166.0 lb

## 2015-12-23 DIAGNOSIS — I255 Ischemic cardiomyopathy: Secondary | ICD-10-CM | POA: Diagnosis not present

## 2015-12-23 DIAGNOSIS — I5022 Chronic systolic (congestive) heart failure: Secondary | ICD-10-CM | POA: Diagnosis not present

## 2015-12-23 DIAGNOSIS — I2589 Other forms of chronic ischemic heart disease: Secondary | ICD-10-CM

## 2015-12-23 DIAGNOSIS — I10 Essential (primary) hypertension: Secondary | ICD-10-CM

## 2015-12-23 DIAGNOSIS — E782 Mixed hyperlipidemia: Secondary | ICD-10-CM | POA: Diagnosis not present

## 2015-12-23 DIAGNOSIS — I25812 Atherosclerosis of bypass graft of coronary artery of transplanted heart without angina pectoris: Secondary | ICD-10-CM

## 2015-12-23 NOTE — Patient Instructions (Signed)
Your physician wants you to follow-up in: 6 months with Dr. Hilty. You will receive a reminder letter in the mail two months in advance. If you don't receive a letter, please call our office to schedule the follow-up appointment.    

## 2015-12-23 NOTE — Progress Notes (Signed)
OFFICE NOTE  Chief Complaint:  Routine followup  Primary Care Physician: Odette Fraction, MD  HPI:  Donald Rios  is a pleasant 75 year old gentleman who has a history of CABG in 2000 with a LIMA to the LAD, free IMA to the OM2 and sequential graft to the PLA and PLD. He has done pretty well. He had a prior MI so his EF is about 45-50% with inferior wall motion abnormality. He has got very borderline diabetes and has lost some weight and is taking low-dose metformin but is really not bothered by that. His cholesterol profile actually is excellent. I did obtain recent laboratory work including a CMP that showed an A1c of 5.6, down from 6.0 in January. NMR lipid profile showed LDL particle number of 1035 which is near goal, a calculated LDLC of 79, HDL 44, triglycerides 57, overall a very favorable profile and shows excellent control. He did reduce his dose of simvastatin to 40 mg daily and has noted a marked difference in muscle aches. Otherwise he denies any chest pain, worsening shortness of breath, palpitations, presyncope or syncopal symptoms.  Donald Rios returns today for follow-up. Overall he seems to be doing very well. Last year we performed a nuclear stress test which showed no evidence of ischemia but there was a fixed inferior defect and EF was 49%. He denies any angina and stays fairly active. Blood pressure control is been excellent. He had a repeat cholesterol profile recently which is very favorable with LDL around 50 and total cholesterol about 120. He reports no side effects on pravastatin. He is on daily aspirin.  12/23/2015  I saw Donald Rios today in follow-up. He seems to be doing really well. Blood work recently showed a hemoglobin A1c of 5.8. He's also had a reduction in LDL cholesterol down to 46, with total cholesterol 106 and HDL of 50. Triglycerides are very low at 49 and correlate with his dietary changes. I think this is excellent control and according to new  guidelines would significantly decrease his risk of future cardiac events.  PMHx:  Past Medical History:  Diagnosis Date  . Acute gastric ulcer with hemorrhage 05/06/2013  . Asthma   . Atrial enlargement, left   . 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 Diabetes mellitus   . Diabetic nephropathy ([place])   . Diverticulosis   . ED (erectile dysfunction)   . GERD (gastroesophageal reflux disease)   . Gout   . Hearing loss   . Hiatal hernia 05/2008   EGD with HH and reflux esophagitis.   Marland Kitchen History of MI (myocardial infarction)   . History of nuclear stress test 08/07/2010   dipyridamole; EKG negative for ischemia, low risk scan   . Hyperlipidemia   . Hyperplastic colon polyp 05/2008  . Hypertension   . Ischemic cardiomyopathy    EF 45%, with inferior wall motion abnormality   . Valvular regurgitation    mitral and tricuspid (mild)    Past Surgical History:  Procedure Laterality Date  . CORONARY ARTERY BYPASS GRAFT  2000   LIMA to LAD, free IMA to OM2, sequential graft to PLA & PLD  . ESOPHAGOGASTRODUODENOSCOPY N/A 05/07/2013   Procedure: ESOPHAGOGASTRODUODENOSCOPY (EGD);  Surgeon: Gatha Mayer, MD;  Location: [facility] ENDOSCOPY;  Service: Endoscopy;  Laterality: N/A;  . TRANSTHORACIC ECHOCARDIOGRAM  09/07/2010   EF Q000111Q; LV systolic function mildly reduced; LA mildly dilated; mild-mod MR & mild-mod TR; aortic root sclerosis/calcification;     FAMHx:  Family History  Problem Relation Age of Onset  . Stroke Father   . Hypertension Father   . Stroke Maternal Grandmother   . Stroke Maternal Grandfather   . Diabetes Brother   . Esophageal cancer Brother   . Stomach cancer Maternal Aunt     SOCHx:   reports that he quit smoking about 30 years ago. He has never used smokeless tobacco. He reports that he drinks about 1.2 - 1.8 oz of alcohol per week . He reports that he does not use drugs.  ALLERGIES:  No Known Allergies  ROS: A comprehensive review of systems was  negative.  HOME MEDS: Current Outpatient Prescriptions  Medication Sig Dispense Refill  . allopurinol (ZYLOPRIM) 300 MG tablet TAKE 1 TABLET (300 MG TOTAL) BY MOUTH DAILY. 90 tablet 3  . ALPRAZolam (XANAX) 0.5 MG tablet Take 1 tablet (0.5 mg total) by mouth at bedtime as needed for sleep. 30 tablet 2  . aspirin 81 MG tablet Take 81 mg by mouth daily.    . benazepril (LOTENSIN) 40 MG tablet TAKE 1 TABLET (40 MG TOTAL) BY MOUTH DAILY. 90 tablet 3  . cloNIDine (CATAPRES) 0.2 MG tablet TAKE 1 TABLET (0.2 MG TOTAL) BY MOUTH 2 (TWO) TIMES DAILY. 180 tablet 2  . hydrochlorothiazide (HYDRODIURIL) 25 MG tablet TAKE 1 TABLET (25 MG TOTAL) BY MOUTH DAILY. 90 tablet 4  . metoprolol (LOPRESSOR) 50 MG tablet Take 1.5 tablets (75 mg total) by mouth 2 (two) times daily. 270 tablet 3  . pantoprazole (PROTONIX) 40 MG tablet TAKE 1 TABLET (40 MG TOTAL) BY MOUTH DAILY. 90 tablet 4  . simvastatin (ZOCOR) 40 MG tablet TAKE 1 TABLET (40 MG TOTAL) BY MOUTH DAILY. 90 tablet 3   No current facility-administered medications for this visit.     LABS/IMAGING: No results found for this or any previous visit (from the past 48 hour(s)). No results found.  VITALS: BP 130/62   Pulse (!) 51   Ht 5' 8.5" (1.74 m)   Wt 166 lb (75.3 kg)   BMI 24.87 kg/m   EXAM: General appearance: alert and no distress Neck: no adenopathy, no carotid bruit, no JVD, supple, symmetrical, trachea midline and thyroid not enlarged, symmetric, no tenderness/mass/nodules Lungs: clear to auscultation bilaterally Heart: regular rate and rhythm, S1, S2 normal, no murmur, click, rub or gallop Abdomen: soft, non-tender; bowel sounds normal; no masses,  no organomegaly Extremities: extremities normal, atraumatic, no cyanosis or edema Pulses: 2+ and symmetric Skin: Skin color, texture, turgor normal. No rashes or lesions Neurologic: Grossly normal  EKG: Sinus bradycardia 51, inferior infarct with ST and T wave abnormalities laterally  (unchanged from prior EKG)  ASSESSMENT: 1. Coronary disease status post three-vessel CABG in 2000 2. History of inferior MI 3. Ischemic cardiomyopathy EF 49% (2015) 4. Hypertension 5. Dyslipidemia 6. Diabetes type 2 with a recent A1c of 5.8 (diet controlled)  PLAN: 1.   Donald Rios is doing very well and continues to exercise several times a week. He is asymptomatic and his nuclear stress test in 2015 that was reassuring without any evidence of ischemia but there is an inferior infarct which is evident on his EKG. Blood pressure is well-controlled. His cholesterol is excellent. Overall I feel like he is at very low risk and continues to do well. We'll plan to see him back annually or sooner as necessary. No changes to his medicines at this time.  Pixie Casino, MD, [facility] Attending Cardiologist [place]  12/23/2015, 1:11 PM

## 2015-12-25 ENCOUNTER — Ambulatory Visit: Payer: Medicare Other | Admitting: Internal Medicine

## 2016-01-22 ENCOUNTER — Ambulatory Visit (HOSPITAL_COMMUNITY)
Admission: EM | Admit: 2016-01-22 | Discharge: 2016-01-22 | Disposition: A | Discharge status: Home / Self Care | Payer: Medicare Other | Attending: Family Medicine | Admitting: Family Medicine

## 2016-01-22 ENCOUNTER — Encounter (HOSPITAL_COMMUNITY): Payer: Self-pay | Admitting: Family Medicine

## 2016-01-22 DIAGNOSIS — J0100 Acute maxillary sinusitis, unspecified: Secondary | ICD-10-CM

## 2016-01-22 DIAGNOSIS — R05 Cough: Secondary | ICD-10-CM

## 2016-01-22 DIAGNOSIS — R059 Cough, unspecified: Secondary | ICD-10-CM

## 2016-01-22 MED ORDER — AMOXICILLIN 875 MG PO TABS: 875.0000 mg | ORAL_TABLET | Freq: Two times a day (BID) | ORAL | 0 refills | Status: DC

## 2016-01-22 MED ORDER — BENZONATATE 100 MG PO CAPS: 200.0000 mg | ORAL_CAPSULE | Freq: Three times a day (TID) | ORAL | 0 refills | Status: DC | PRN

## 2016-01-22 NOTE — ED Triage Notes (Signed)
Pt here for sinus congestion and nasal drainage that started last night. Slight cough.

## 2016-02-03 ENCOUNTER — Encounter: Payer: Self-pay | Admitting: Family Medicine

## 2016-02-03 ENCOUNTER — Ambulatory Visit (INDEPENDENT_AMBULATORY_CARE_PROVIDER_SITE_OTHER): Payer: Medicare Other | Admitting: Family Medicine

## 2016-02-03 VITALS — BP 136/60 | HR 60 | Temp 98.6°F | Resp 14 | Ht 68.5 in | Wt 163.0 lb

## 2016-02-03 DIAGNOSIS — J019 Acute sinusitis, unspecified: Secondary | ICD-10-CM | POA: Diagnosis not present

## 2016-02-03 MED ORDER — PREDNISONE 20 MG PO TABS: ORAL_TABLET | ORAL | 0 refills | Status: DC

## 2016-02-03 NOTE — Progress Notes (Signed)
Subjective:    Patient ID: Donald Rios, male    DOB: [birth year], 76 y.o.   MRN: [id number]  HPI  Patient has been sick for approximately 12 days. He was seen in urgent care and was given amoxicillin for 10 days. Symptoms improved dramatically although he continues to have sinus pressure, postnasal drip, throat congestion, and a nonproductive cough. He denies any chest pain shortness of breath pleurisy hemoptysis or fever Past Medical History:  Diagnosis Date  . Acute gastric ulcer with hemorrhage 05/06/2013  . Asthma   . Atrial enlargement, left   . 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 Diabetes mellitus   . Diabetic nephropathy ([place])   . Diverticulosis   . ED (erectile dysfunction)   . GERD (gastroesophageal reflux disease)   . Gout   . Hearing loss   . Hiatal hernia 05/2008   EGD with HH and reflux esophagitis.   Marland Kitchen History of MI (myocardial infarction)   . History of nuclear stress test 08/07/2010   dipyridamole; EKG negative for ischemia, low risk scan   . Hyperlipidemia   . Hyperplastic colon polyp 05/2008  . Hypertension   . Ischemic cardiomyopathy    EF 45%, with inferior wall motion abnormality   . Valvular regurgitation    mitral and tricuspid (mild)   Past Surgical History:  Procedure Laterality Date  . CORONARY ARTERY BYPASS GRAFT  2000   LIMA to LAD, free IMA to OM2, sequential graft to PLA & PLD  . ESOPHAGOGASTRODUODENOSCOPY N/A 05/07/2013   Procedure: ESOPHAGOGASTRODUODENOSCOPY (EGD);  Surgeon: Gatha Mayer, MD;  Location: [facility] ENDOSCOPY;  Service: Endoscopy;  Laterality: N/A;  . TRANSTHORACIC ECHOCARDIOGRAM  09/07/2010   EF [id number]; LV systolic function mildly reduced; LA mildly dilated; mild-mod MR & mild-mod TR; aortic root sclerosis/calcification;    Current Outpatient Prescriptions on File Prior to Visit  Medication Sig Dispense Refill  . ALPRAZolam (XANAX) 0.5 MG tablet Take 1 tablet (0.5 mg total) by mouth at bedtime as needed for sleep. 30  tablet 2  . aspirin 81 MG tablet Take 81 mg by mouth daily.    . benazepril (LOTENSIN) 40 MG tablet TAKE 1 TABLET (40 MG TOTAL) BY MOUTH DAILY. 90 tablet 3  . cloNIDine (CATAPRES) 0.2 MG tablet TAKE 1 TABLET (0.2 MG TOTAL) BY MOUTH 2 (TWO) TIMES DAILY. 180 tablet 2  . hydrochlorothiazide (HYDRODIURIL) 25 MG tablet TAKE 1 TABLET (25 MG TOTAL) BY MOUTH DAILY. 90 tablet 4  . metoprolol (LOPRESSOR) 50 MG tablet Take 1.5 tablets (75 mg total) by mouth 2 (two) times daily. 270 tablet 3  . pantoprazole (PROTONIX) 40 MG tablet TAKE 1 TABLET (40 MG TOTAL) BY MOUTH DAILY. 90 tablet 4  . simvastatin (ZOCOR) 40 MG tablet TAKE 1 TABLET (40 MG TOTAL) BY MOUTH DAILY. 90 tablet 3  . allopurinol (ZYLOPRIM) 300 MG tablet TAKE 1 TABLET (300 MG TOTAL) BY MOUTH DAILY. 90 tablet 3   No current facility-administered medications on file prior to visit.    No Known Allergies Social History   Social History  . Marital status: Married    Spouse name: N/A  . Number of children: 1  . Years of education: N/A   Occupational History  . Not on file.   Social History Main Topics  . Smoking status: Former Smoker    Quit date: 11/04/1985  . Smokeless tobacco: Never Used  . Alcohol use 1.2 - 1.8 oz/week    2 - 3 Cans  of beer per week  . Drug use: No  . Sexual activity: Not on file   Other Topics Concern  . Not on file   Social History Narrative  . No narrative on file     Review of Systems  All other systems reviewed and are negative.      Objective:   Physical Exam  Constitutional: He appears well-developed and well-nourished.  HENT:  Head: Normocephalic and atraumatic.  Right Ear: External ear normal.  Left Ear: External ear normal.  Nose: Mucosal edema and rhinorrhea present. Right sinus exhibits no maxillary sinus tenderness and no frontal sinus tenderness. Left sinus exhibits no maxillary sinus tenderness and no frontal sinus tenderness.  Mouth/Throat: Oropharynx is clear and moist. No  oropharyngeal exudate.  Eyes: Conjunctivae are normal.  Cardiovascular: Normal rate, regular rhythm and normal heart sounds.   Pulmonary/Chest: Effort normal and breath sounds normal.  Lymphadenopathy:    He has no cervical adenopathy.  Vitals reviewed.         Assessment & Plan:  Acute rhinosinusitis  Patient has rhinosinusitis and has improved dramatically after taking amoxicillin. I recommended tincture of time. Patient would like something to try to improve the head congestion and swelling and postnasal drip and therefore I gave him a prednisone taper pack. Should symptoms worsen, we can switch the patient on Omnicef but at the present time he is clinically improving

## 2016-02-16 DIAGNOSIS — E119 Type 2 diabetes mellitus without complications: Secondary | ICD-10-CM | POA: Diagnosis not present

## 2016-02-16 LAB — HM DIABETES EYE EXAM

## 2016-02-19 ENCOUNTER — Encounter: Payer: Self-pay | Admitting: Family Medicine

## 2016-02-19 ENCOUNTER — Other Ambulatory Visit: Payer: Self-pay | Admitting: Family Medicine

## 2016-02-19 NOTE — Telephone Encounter (Signed)
Medication called to pharmacy. 

## 2016-02-19 NOTE — Telephone Encounter (Signed)
ok 

## 2016-02-19 NOTE — Telephone Encounter (Signed)
Ok to refill 

## 2016-04-05 ENCOUNTER — Ambulatory Visit (INDEPENDENT_AMBULATORY_CARE_PROVIDER_SITE_OTHER): Payer: Medicare Other | Admitting: Family Medicine

## 2016-04-05 ENCOUNTER — Encounter: Payer: Self-pay | Admitting: Family Medicine

## 2016-04-05 VITALS — BP 110/58 | HR 68 | Temp 98.4°F | Resp 16 | Ht 68.5 in | Wt 164.0 lb

## 2016-04-05 DIAGNOSIS — Z8679 Personal history of other diseases of the circulatory system: Secondary | ICD-10-CM | POA: Diagnosis not present

## 2016-04-05 DIAGNOSIS — E119 Type 2 diabetes mellitus without complications: Secondary | ICD-10-CM

## 2016-04-05 DIAGNOSIS — I10 Essential (primary) hypertension: Secondary | ICD-10-CM | POA: Diagnosis not present

## 2016-04-05 DIAGNOSIS — M353 Polymyalgia rheumatica: Secondary | ICD-10-CM

## 2016-04-05 DIAGNOSIS — E7800 Pure hypercholesterolemia, unspecified: Secondary | ICD-10-CM | POA: Diagnosis not present

## 2016-04-05 LAB — CBC WITH DIFFERENTIAL/PLATELET
Basophils Absolute: 0 cells/uL (ref 0–200)
Basophils Relative: 0 %
EOS PCT: 2 %
Eosinophils Absolute: 194 cells/uL (ref 15–500)
HCT: 42.4 % (ref 38.5–50.0)
Hemoglobin: 13.4 g/dL (ref 13.0–17.0)
LYMPHS PCT: 14 %
Lymphs Abs: 1358 cells/uL (ref 850–3900)
MCH: 29.3 pg (ref 27.0–33.0)
MCHC: 31.6 g/dL — ABNORMAL LOW (ref 32.0–36.0)
MCV: 92.8 fL (ref 80.0–100.0)
MPV: 11.3 fL (ref 7.5–12.5)
Monocytes Absolute: 582 cells/uL (ref 200–950)
Monocytes Relative: 6 %
NEUTROS PCT: 78 %
Neutro Abs: 7566 cells/uL (ref 1500–7800)
PLATELETS: 293 10*3/uL (ref 140–400)
RBC: 4.57 MIL/uL (ref 4.20–5.80)
RDW: 14.1 % (ref 11.0–15.0)
WBC: 9.7 10*3/uL (ref 3.8–10.8)

## 2016-04-05 LAB — LIPID PANEL
CHOL/HDL RATIO: 2.5 ratio (ref <5.0)
CHOLESTEROL: 126 mg/dL (ref <200)
HDL: 50 mg/dL (ref >40)
LDL CALC: 64 mg/dL (ref <100)
Triglycerides: 61 mg/dL (ref <150)
VLDL: 12 mg/dL (ref <30)

## 2016-04-05 LAB — COMPLETE METABOLIC PANEL WITH GFR
ALT: 8 U/L — ABNORMAL LOW (ref 9–46)
AST: 12 U/L (ref 10–35)
Albumin: 3.7 g/dL (ref 3.6–5.1)
Alkaline Phosphatase: 52 U/L (ref 40–115)
BUN: 26 mg/dL — AB (ref 7–25)
CHLORIDE: 107 mmol/L (ref 98–110)
CO2: 22 mmol/L (ref 20–31)
Calcium: 9.6 mg/dL (ref 8.6–10.3)
Creat: 1.25 mg/dL — ABNORMAL HIGH (ref 0.70–1.18)
GFR, Est African American: 65 mL/min (ref >=60)
GFR, Est Non African American: 56 mL/min — ABNORMAL LOW (ref >=60)
GLUCOSE: 151 mg/dL — AB (ref 70–99)
POTASSIUM: 4.3 mmol/L (ref 3.5–5.3)
SODIUM: 142 mmol/L (ref 135–146)
Total Bilirubin: 0.7 mg/dL (ref 0.2–1.2)
Total Protein: 6.2 g/dL (ref 6.1–8.1)

## 2016-04-05 MED ORDER — PREDNISONE 10 MG PO TABS: 10.0000 mg | ORAL_TABLET | Freq: Every day | ORAL | 0 refills | Status: DC

## 2016-04-05 NOTE — Progress Notes (Signed)
Subjective:    Patient ID: Donald Rios, male    DOB: 01-06-1941, 76 y.o.   MRN: [id number]  Medication Refill   Here today for recheck of his medical problems. His blood pressure today is well controlled. He denies any chest pain shortness of breath or dyspnea on exertion. He is currently taking simvastatin for hyperlipidemia and given his history of ASCVD. He does report myalgias. They're mainly in his shoulders and in his hips. They're debilitating. He is hardly able to get out of bed in the morning. Takes several hours before he can even hardly walk. He believes his PMR he was diagnosed with in 2011 is starting to flareup again. Recently went to give him prednisone for a sinus infection the pain completely went away. Regarding his diabetes, his fasting blood sugars are all below 130. He denies any polyuria, polydipsia, or blurry vision  Past Medical History:  Diagnosis Date  . Acute gastric ulcer with hemorrhage 05/06/2013  . Asthma   . Atrial enlargement, left   . 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 Diabetes mellitus   . Diabetic nephropathy ([place])   . Diverticulosis   . ED (erectile dysfunction)   . GERD (gastroesophageal reflux disease)   . Gout   . Hearing loss   . Hiatal hernia 05/2008   EGD with HH and reflux esophagitis.   Marland Kitchen History of MI (myocardial infarction)   . History of nuclear stress test 08/07/2010   dipyridamole; EKG negative for ischemia, low risk scan   . Hyperlipidemia   . Hyperplastic colon polyp 05/2008  . Hypertension   . Ischemic cardiomyopathy    EF 45%, with inferior wall motion abnormality   . Valvular regurgitation    mitral and tricuspid (mild)   Past Surgical History:  Procedure Laterality Date  . CORONARY ARTERY BYPASS GRAFT  2000   LIMA to LAD, free IMA to OM2, sequential graft to PLA & PLD  . ESOPHAGOGASTRODUODENOSCOPY N/A 05/07/2013   Procedure: ESOPHAGOGASTRODUODENOSCOPY (EGD);  Surgeon: Gatha Mayer, MD;  Location: [facility]  ENDOSCOPY;  Service: Endoscopy;  Laterality: N/A;  . TRANSTHORACIC ECHOCARDIOGRAM  09/07/2010   EF 50-53%; LV systolic function mildly reduced; LA mildly dilated; mild-mod MR & mild-mod TR; aortic root sclerosis/calcification;    Current Outpatient Prescriptions on File Prior to Visit  Medication Sig Dispense Refill  . allopurinol (ZYLOPRIM) 300 MG tablet TAKE 1 TABLET (300 MG TOTAL) BY MOUTH DAILY. 90 tablet 3  . ALPRAZolam (XANAX) 0.5 MG tablet TAKE 1 TABLET BY MOUTH EVERY NIGHT AT BEDTIME AS NEEDED FOR ANXIETY/ SLEEP 30 tablet 0  . aspirin 81 MG tablet Take 81 mg by mouth daily.    . benazepril (LOTENSIN) 40 MG tablet TAKE 1 TABLET (40 MG TOTAL) BY MOUTH DAILY. 90 tablet 3  . cloNIDine (CATAPRES) 0.2 MG tablet TAKE 1 TABLET (0.2 MG TOTAL) BY MOUTH 2 (TWO) TIMES DAILY. 180 tablet 2  . hydrochlorothiazide (HYDRODIURIL) 25 MG tablet TAKE 1 TABLET (25 MG TOTAL) BY MOUTH DAILY. 90 tablet 4  . metoprolol (LOPRESSOR) 50 MG tablet Take 1.5 tablets (75 mg total) by mouth 2 (two) times daily. 270 tablet 3  . pantoprazole (PROTONIX) 40 MG tablet TAKE 1 TABLET (40 MG TOTAL) BY MOUTH DAILY. 90 tablet 4  . predniSONE (DELTASONE) 20 MG tablet 3 tabs poqday 1-2, 2 tabs poqday 3-4, 1 tab poqday 5-6 12 tablet 0  . simvastatin (ZOCOR) 40 MG tablet TAKE 1 TABLET (40 MG TOTAL) BY  MOUTH DAILY. 90 tablet 3   No current facility-administered medications on file prior to visit.    No Known Allergies Social History   Social History  . Marital status: Married    Spouse name: N/A  . Number of children: 1  . Years of education: N/A   Occupational History  . Not on file.   Social History Main Topics  . Smoking status: Former Smoker    Quit date: 11/04/1985  . Smokeless tobacco: Never Used  . Alcohol use 1.2 - 1.8 oz/week    2 - 3 Cans of beer per week  . Drug use: No  . Sexual activity: Not on file   Other Topics Concern  . Not on file   Social History Narrative  . No narrative on file     Review  of Systems  All other systems reviewed and are negative.      Objective:   Physical Exam  Constitutional: He appears well-developed and well-nourished.  Neck: Neck supple. No JVD present. No thyromegaly present.  Cardiovascular: Normal rate, regular rhythm, normal heart sounds and intact distal pulses.   No murmur heard. Pulmonary/Chest: Effort normal and breath sounds normal. No respiratory distress. He has no wheezes. He has no rales. He exhibits no tenderness.  Abdominal: Soft. Bowel sounds are normal. He exhibits no distension and no mass. There is no tenderness. There is no rebound and no guarding.  Musculoskeletal: He exhibits no edema.  Lymphadenopathy:    He has no cervical adenopathy.  Skin: No rash noted.  Vitals reviewed.         Assessment & Plan:  Controlled type 2 diabetes mellitus without complication, without long-term current use of insulin ([place]) - Plan: CBC with Differential/Platelet, COMPLETE METABOLIC PANEL WITH GFR, Lipid panel, Microalbumin, urine, Hemoglobin A1c  Benign essential HTN  Pure hypercholesterolemia  History of ASCVD  PMR (polymyalgia rheumatica) (HCC) - Plan: predniSONE (DELTASONE) 10 MG tablet  I will check a hemoglobin A1c. Goal hemoglobin A1c is less than 6.5. Also check a fasting lipid panel. Goal LDL cholesterol is less than 70. Blood pressure is at goal. Patient is compliant with his antiplatelet agent. I do believe his PMR is reactivated. I will start patient on prednisone 10 mg a day and recheck in one week to see if his pain is improving.

## 2016-04-06 LAB — HEMOGLOBIN A1C
HEMOGLOBIN A1C: 6.3 % — AB (ref <5.7)
MEAN PLASMA GLUCOSE: 134 mg/dL

## 2016-04-06 LAB — MICROALBUMIN, URINE: Microalb, Ur: 0.3 mg/dL

## 2016-04-07 ENCOUNTER — Encounter: Payer: Self-pay | Admitting: Family Medicine

## 2016-04-15 ENCOUNTER — Other Ambulatory Visit: Payer: Self-pay | Admitting: Family Medicine

## 2016-04-15 MED ORDER — ALPRAZOLAM 0.5 MG PO TABS: 0.5000 mg | ORAL_TABLET | Freq: Every evening | ORAL | 0 refills | Status: DC | PRN

## 2016-04-15 NOTE — Telephone Encounter (Signed)
LRF 02/19/16 #30  LOV 04/05/16  OK refill?

## 2016-04-15 NOTE — Telephone Encounter (Signed)
Rx called in 

## 2016-04-15 NOTE — Telephone Encounter (Signed)
ok 

## 2016-05-07 ENCOUNTER — Encounter: Payer: Self-pay | Admitting: Family Medicine

## 2016-05-07 ENCOUNTER — Ambulatory Visit (INDEPENDENT_AMBULATORY_CARE_PROVIDER_SITE_OTHER): Payer: Medicare Other | Admitting: Family Medicine

## 2016-05-07 VITALS — BP 100/54 | HR 60 | Temp 98.4°F | Resp 14 | Ht 68.5 in | Wt 162.0 lb

## 2016-05-07 DIAGNOSIS — M353 Polymyalgia rheumatica: Secondary | ICD-10-CM

## 2016-05-07 MED ORDER — PREDNISONE 5 MG PO TABS: 10.0000 mg | ORAL_TABLET | Freq: Every day | ORAL | 2 refills | Status: DC

## 2016-05-07 NOTE — Progress Notes (Signed)
Subjective:    Patient ID: Donald Rios, male    DOB: 1940/03/20, 76 y.o.   MRN: [id number]  Medication Refill   03/31/16 Here today for recheck of his medical problems. His blood pressure today is well controlled. He denies any chest pain shortness of breath or dyspnea on exertion. He is currently taking simvastatin for hyperlipidemia and given his history of ASCVD. He does report myalgias. They're mainly in his shoulders and in his hips. They're debilitating. He is hardly able to get out of bed in the morning. Takes several hours before he can even hardly walk. He believes his PMR he was diagnosed with in 2011 is starting to flareup again. Recently went to give him prednisone for a sinus infection the pain completely went away. Regarding his diabetes, his fasting blood sugars are all below 130. He denies any polyuria, polydipsia, or blurry vision At that time, my plan was:  I will check a hemoglobin A1c. Goal hemoglobin A1c is less than 6.5. Also check a fasting lipid panel. Goal LDL cholesterol is less than 70. Blood pressure is at goal. Patient is compliant with his antiplatelet agent. I do believe his PMR is reactivated. I will start patient on prednisone 10 mg a day and recheck in one week to see if his pain is improving.  05/07/16 Patient took the prednisone and felt almost instantaneously better. The pain in his shoulders and in his hips and thighs completely subsided. 2 days after discontinuing prednisone however the pain came back with just as much severity as before. He is here today to discuss options. Pain seems to be limited to his proximal muscles including his shoulders, his neck, hips and thighs. Past Medical History:  Diagnosis Date  . Acute gastric ulcer with hemorrhage 05/06/2013  . Asthma   . Atrial enlargement, left   . 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 Diabetes mellitus   . Diabetic nephropathy ([place])   . Diverticulosis   . ED (erectile dysfunction)   .  GERD (gastroesophageal reflux disease)   . Gout   . Hearing loss   . Hiatal hernia 05/2008   EGD with HH and reflux esophagitis.   Marland Kitchen History of MI (myocardial infarction)   . History of nuclear stress test 08/07/2010   dipyridamole; EKG negative for ischemia, low risk scan   . Hyperlipidemia   . Hyperplastic colon polyp 05/2008  . Hypertension   . Ischemic cardiomyopathy    EF 45%, with inferior wall motion abnormality   . Valvular regurgitation    mitral and tricuspid (mild)   Past Surgical History:  Procedure Laterality Date  . CORONARY ARTERY BYPASS GRAFT  2000   LIMA to LAD, free IMA to OM2, sequential graft to PLA & PLD  . ESOPHAGOGASTRODUODENOSCOPY N/A 05/07/2013   Procedure: ESOPHAGOGASTRODUODENOSCOPY (EGD);  Surgeon: Gatha Mayer, MD;  Location: [facility] ENDOSCOPY;  Service: Endoscopy;  Laterality: N/A;  . TRANSTHORACIC ECHOCARDIOGRAM  09/07/2010   EF 71-06%; LV systolic function mildly reduced; LA mildly dilated; mild-mod MR & mild-mod TR; aortic root sclerosis/calcification;    Current Outpatient Prescriptions on File Prior to Visit  Medication Sig Dispense Refill  . allopurinol (ZYLOPRIM) 300 MG tablet TAKE 1 TABLET (300 MG TOTAL) BY MOUTH DAILY. 90 tablet 3  . ALPRAZolam (XANAX) 0.5 MG tablet Take 1 tablet (0.5 mg total) by mouth at bedtime as needed for anxiety. 30 tablet 0  . aspirin 81 MG tablet Take 81 mg by mouth daily.    Marland Kitchen  benazepril (LOTENSIN) 40 MG tablet TAKE 1 TABLET (40 MG TOTAL) BY MOUTH DAILY. 90 tablet 3  . cloNIDine (CATAPRES) 0.2 MG tablet TAKE 1 TABLET (0.2 MG TOTAL) BY MOUTH 2 (TWO) TIMES DAILY. 180 tablet 2  . hydrochlorothiazide (HYDRODIURIL) 25 MG tablet TAKE 1 TABLET (25 MG TOTAL) BY MOUTH DAILY. 90 tablet 4  . metoprolol (LOPRESSOR) 50 MG tablet Take 1.5 tablets (75 mg total) by mouth 2 (two) times daily. 270 tablet 3  . pantoprazole (PROTONIX) 40 MG tablet TAKE 1 TABLET (40 MG TOTAL) BY MOUTH DAILY. 90 tablet 4  . simvastatin (ZOCOR) 40 MG tablet TAKE 1  TABLET (40 MG TOTAL) BY MOUTH DAILY. 90 tablet 3   No current facility-administered medications on file prior to visit.    No Known Allergies Social History   Social History  . Marital status: Married    Spouse name: N/A  . Number of children: 1  . Years of education: N/A   Occupational History  . Not on file.   Social History Main Topics  . Smoking status: Former Smoker    Quit date: 11/04/1985  . Smokeless tobacco: Never Used  . Alcohol use 1.2 - 1.8 oz/week    2 - 3 Cans of beer per week  . Drug use: No  . Sexual activity: Not on file   Other Topics Concern  . Not on file   Social History Narrative  . No narrative on file     Review of Systems  All other systems reviewed and are negative.      Objective:   Physical Exam  Constitutional: He appears well-developed and well-nourished.  Neck: Neck supple. No JVD present. No thyromegaly present.  Cardiovascular: Normal rate, regular rhythm, normal heart sounds and intact distal pulses.   No murmur heard. Pulmonary/Chest: Effort normal and breath sounds normal. No respiratory distress. He has no wheezes. He has no rales. He exhibits no tenderness.  Abdominal: Soft. Bowel sounds are normal. He exhibits no distension and no mass. There is no tenderness. There is no rebound and no guarding.  Musculoskeletal: He exhibits no edema.  Lymphadenopathy:    He has no cervical adenopathy.  Skin: No rash noted.  Vitals reviewed.         Assessment & Plan:  PMR (polymyalgia rheumatica) (HCC) Resume prednisone 10 mg a day. In 2 weeks, decreased to 7.5 mg a day, and 2 weeks decrease to 5 mg a day, and 2 weeks decrease to 2.5 mg a day, and so on until successfully weaned off the medication hopefully.

## 2016-05-12 ENCOUNTER — Other Ambulatory Visit: Payer: Self-pay | Admitting: Family Medicine

## 2016-05-15 ENCOUNTER — Other Ambulatory Visit: Payer: Self-pay | Admitting: Family Medicine

## 2016-05-25 ENCOUNTER — Telehealth: Payer: Self-pay | Admitting: Family Medicine

## 2016-05-25 NOTE — Telephone Encounter (Signed)
Requesting refill on Xanax - Ok to refill??      LRF - 04/15/16

## 2016-05-25 NOTE — Telephone Encounter (Signed)
ok 

## 2016-05-26 MED ORDER — ALPRAZOLAM 0.5 MG PO TABS: 0.5000 mg | ORAL_TABLET | Freq: Every evening | ORAL | 2 refills | Status: DC | PRN

## 2016-05-26 NOTE — Telephone Encounter (Signed)
Medication called/sent to requested pharmacy  

## 2016-06-12 ENCOUNTER — Other Ambulatory Visit: Payer: Self-pay | Admitting: Family Medicine

## 2016-06-25 ENCOUNTER — Ambulatory Visit (INDEPENDENT_AMBULATORY_CARE_PROVIDER_SITE_OTHER): Payer: Medicare Other | Admitting: Family Medicine

## 2016-06-25 VITALS — BP 120/60 | HR 80 | Temp 97.8°F | Resp 14 | Ht 68.5 in | Wt 164.0 lb

## 2016-06-25 DIAGNOSIS — S76311A Strain of muscle, fascia and tendon of the posterior muscle group at thigh level, right thigh, initial encounter: Secondary | ICD-10-CM

## 2016-06-25 MED ORDER — CYCLOBENZAPRINE HCL 10 MG PO TABS: 10.0000 mg | ORAL_TABLET | Freq: Three times a day (TID) | ORAL | 0 refills | Status: DC | PRN

## 2016-06-25 NOTE — Progress Notes (Signed)
Subjective:    Patient ID: Donald Rios, male    DOB: 01-11-1941, 76 y.o.   MRN: [id number]  HPI  One week ago, the patient strained his right hamstring while pushing a wheelbarrow full of scleral. He now has pain radiating into his right gluteus from the mid hamstring. He has pain with hip extension and knee flexion. It hurts to walk. He denies any neuropathic symptoms. He denies any low back pain. He is tender to palpation over the ischial spine. Past Medical History:  Diagnosis Date  . Acute gastric ulcer with hemorrhage 05/06/2013  . Asthma   . Atrial enlargement, left   . 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 Diabetes mellitus   . Diabetic nephropathy ([place])   . Diverticulosis   . ED (erectile dysfunction)   . GERD (gastroesophageal reflux disease)   . Gout   . Hearing loss   . Hiatal hernia 05/2008   EGD with HH and reflux esophagitis.   Marland Kitchen History of MI (myocardial infarction)   . History of nuclear stress test 08/07/2010   dipyridamole; EKG negative for ischemia, low risk scan   . Hyperlipidemia   . Hyperplastic colon polyp 05/2008  . Hypertension   . Ischemic cardiomyopathy    EF 45%, with inferior wall motion abnormality   . Valvular regurgitation    mitral and tricuspid (mild)   Past Surgical History:  Procedure Laterality Date  . CORONARY ARTERY BYPASS GRAFT  2000   LIMA to LAD, free IMA to OM2, sequential graft to PLA & PLD  . ESOPHAGOGASTRODUODENOSCOPY N/A 05/07/2013   Procedure: ESOPHAGOGASTRODUODENOSCOPY (EGD);  Surgeon: Gatha Mayer, MD;  Location: [facility] ENDOSCOPY;  Service: Endoscopy;  Laterality: N/A;  . TRANSTHORACIC ECHOCARDIOGRAM  09/07/2010   EF 22-48%; LV systolic function mildly reduced; LA mildly dilated; mild-mod MR & mild-mod TR; aortic root sclerosis/calcification;    Current Outpatient Prescriptions on File Prior to Visit  Medication Sig Dispense Refill  . allopurinol (ZYLOPRIM) 300 MG tablet TAKE ONE TABLET BY MOUTH DAILY 90 tablet 2    . ALPRAZolam (XANAX) 0.5 MG tablet Take 1 tablet (0.5 mg total) by mouth at bedtime as needed for anxiety. 30 tablet 2  . aspirin 81 MG tablet Take 81 mg by mouth daily.    . benazepril (LOTENSIN) 40 MG tablet TAKE ONE TABLET BY MOUTH DAILY 90 tablet 2  . cloNIDine (CATAPRES) 0.2 MG tablet TAKE 1 TABLET (0.2 MG TOTAL) BY MOUTH 2 (TWO) TIMES DAILY. 180 tablet 2  . hydrochlorothiazide (HYDRODIURIL) 25 MG tablet TAKE 1 TABLET (25 MG TOTAL) BY MOUTH DAILY. 90 tablet 3  . metoprolol (LOPRESSOR) 50 MG tablet TAKE ONE AND ONE-HALF (1 & 1/2) TABLET BY MOUTH TWO TIMES A DAY 270 tablet 2  . pantoprazole (PROTONIX) 40 MG tablet TAKE 1 TABLET (40 MG TOTAL) BY MOUTH DAILY. 90 tablet 3  . predniSONE (DELTASONE) 5 MG tablet Take 2 tablets (10 mg total) by mouth daily with breakfast. 60 tablet 2  . simvastatin (ZOCOR) 40 MG tablet TAKE ONE TABLET BY MOUTH DAILY 90 tablet 2   No current facility-administered medications on file prior to visit.    No Known Allergies Social History   Social History  . Marital status: Married    Spouse name: N/A  . Number of children: 1  . Years of education: N/A   Occupational History  . Not on file.   Social History Main Topics  . Smoking status: Former Smoker  Quit date: 11/04/1985  . Smokeless tobacco: Never Used  . Alcohol use 1.2 - 1.8 oz/week    2 - 3 Cans of beer per week  . Drug use: No  . Sexual activity: Not on file   Other Topics Concern  . Not on file   Social History Narrative  . No narrative on file     Review of Systems  All other systems reviewed and are negative.      Objective:   Physical Exam  Constitutional: He appears well-developed and well-nourished.  Cardiovascular: Normal rate, regular rhythm and normal heart sounds.   Pulmonary/Chest: Effort normal and breath sounds normal.  Musculoskeletal:       Right upper leg: He exhibits tenderness and bony tenderness. He exhibits no swelling, no edema and no deformity.        Legs: Vitals reviewed.         Assessment & Plan:  Strain of right hamstring  Recommended heat, tincture of time, Flexeril 5-10 mg every 8 hours as needed, and range of motion stretching. Reassess if no better in 2-3 weeks or sooner if worse

## 2016-07-15 ENCOUNTER — Ambulatory Visit (INDEPENDENT_AMBULATORY_CARE_PROVIDER_SITE_OTHER): Payer: Medicare Other | Admitting: Internal Medicine

## 2016-07-15 ENCOUNTER — Encounter: Payer: Self-pay | Admitting: Internal Medicine

## 2016-07-15 VITALS — BP 142/68 | HR 57 | Ht 68.5 in | Wt 163.6 lb

## 2016-07-15 DIAGNOSIS — I10 Essential (primary) hypertension: Secondary | ICD-10-CM | POA: Diagnosis not present

## 2016-07-15 DIAGNOSIS — E782 Mixed hyperlipidemia: Secondary | ICD-10-CM | POA: Diagnosis not present

## 2016-07-15 DIAGNOSIS — I2589 Other forms of chronic ischemic heart disease: Secondary | ICD-10-CM | POA: Diagnosis not present

## 2016-07-15 DIAGNOSIS — I255 Ischemic cardiomyopathy: Secondary | ICD-10-CM | POA: Diagnosis not present

## 2016-07-15 DIAGNOSIS — I5022 Chronic systolic (congestive) heart failure: Secondary | ICD-10-CM

## 2016-07-15 DIAGNOSIS — I25812 Atherosclerosis of bypass graft of coronary artery of transplanted heart without angina pectoris: Secondary | ICD-10-CM

## 2016-07-15 NOTE — Progress Notes (Signed)
OFFICE NOTE  Chief Complaint:  Pulled hamstring recently  Primary Care Physician: Susy Frizzle, MD  HPI:  Donald Rios  is a pleasant 76 year old gentleman who has a history of CABG in 2000 with a LIMA to the LAD, free IMA to the OM2 and sequential graft to the PLA and PLD. He has done pretty well. He had a prior MI so his EF is about 45-50% with inferior wall motion abnormality. He has got very borderline diabetes and has lost some weight and is taking low-dose metformin but is really not bothered by that. His cholesterol profile actually is excellent. I did obtain recent laboratory work including a CMP that showed an A1c of 5.6, down from 6.0 in January. NMR lipid profile showed LDL particle number of 1035 which is near goal, a calculated LDLC of 79, HDL 44, triglycerides 57, overall a very favorable profile and shows excellent control. He did reduce his dose of simvastatin to 40 mg daily and has noted a marked difference in muscle aches. Otherwise he denies any chest pain, worsening shortness of breath, palpitations, presyncope or syncopal symptoms.  Mr. Rho returns today for follow-up. Overall he seems to be doing very well. Last year we performed a nuclear stress test which showed no evidence of ischemia but there was a fixed inferior defect and EF was 49%. He denies any angina and stays fairly active. Blood pressure control is been excellent. He had a repeat cholesterol profile recently which is very favorable with LDL around 50 and total cholesterol about 120. He reports no side effects on pravastatin. He is on daily aspirin.  12/23/2015  I saw Mr. Stankey today in follow-up. He seems to be doing really well. Blood work recently showed a hemoglobin A1c of 5.8. He's also had a reduction in LDL cholesterol down to 46, with total cholesterol 106 and HDL of 50. Triglycerides are very low at 49 and correlate with his dietary changes. I think this is excellent control and according to  new guidelines would significantly decrease his risk of future cardiac events.  07/15/2016  Mr. Agner returns today for follow-up. He recently pulled a hamstring however from a cardiac standpoint is asymptomatic. Denies a chest pain or worsening shortness of breath. He's done some heavy yard work recently without any chest pain. 3 months ago he had a lipid profile which shows excellent control. Total cholesterol 126, triglycerides 61, HL-C 50, LDL-C 64. Blood pressure slightly elevated today however recheck Mrs. to indulging too much yesterday for the Fourth of July.  PMHx:  Past Medical History:  Diagnosis Date  . Acute gastric ulcer with hemorrhage 05/06/2013  . Asthma   . Atrial enlargement, left   . 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 Diabetes mellitus   . Diabetic nephropathy ([place])   . Diverticulosis   . ED (erectile dysfunction)   . GERD (gastroesophageal reflux disease)   . Gout   . Hearing loss   . Hiatal hernia 05/2008   EGD with HH and reflux esophagitis.   Marland Kitchen History of MI (myocardial infarction)   . History of nuclear stress test 08/07/2010   dipyridamole; EKG negative for ischemia, low risk scan   . Hyperlipidemia   . Hyperplastic colon polyp 05/2008  . Hypertension   . Ischemic cardiomyopathy    EF 45%, with inferior wall motion abnormality   . Valvular regurgitation    mitral and tricuspid (mild)    Past Surgical History:  Procedure  Laterality Date  . CORONARY ARTERY BYPASS GRAFT  2000   LIMA to LAD, free IMA to OM2, sequential graft to PLA & PLD  . ESOPHAGOGASTRODUODENOSCOPY N/A 05/07/2013   Procedure: ESOPHAGOGASTRODUODENOSCOPY (EGD);  Surgeon: Gatha Mayer, MD;  Location: [facility] ENDOSCOPY;  Service: Endoscopy;  Laterality: N/A;  . TRANSTHORACIC ECHOCARDIOGRAM  09/07/2010   EF 17-40%; LV systolic function mildly reduced; LA mildly dilated; mild-mod MR & mild-mod TR; aortic root sclerosis/calcification;     FAMHx:  Family History  Problem Relation Age  of Onset  . Stroke Father   . Hypertension Father   . Stroke Maternal Grandmother   . Stroke Maternal Grandfather   . Diabetes Brother   . Esophageal cancer Brother   . Stomach cancer Maternal Aunt     SOCHx:   reports that he quit smoking about 30 years ago. He has never used smokeless tobacco. He reports that he drinks about 1.2 - 1.8 oz of alcohol per week . He reports that he does not use drugs.  ALLERGIES:  No Known Allergies  ROS: Pertinent items noted in HPI and remainder of comprehensive ROS otherwise negative.  HOME MEDS: Current Outpatient Prescriptions  Medication Sig Dispense Refill  . allopurinol (ZYLOPRIM) 300 MG tablet TAKE ONE TABLET BY MOUTH DAILY 90 tablet 2  . ALPRAZolam (XANAX) 0.5 MG tablet Take 1 tablet (0.5 mg total) by mouth at bedtime as needed for anxiety. 30 tablet 2  . aspirin 81 MG tablet Take 81 mg by mouth daily.    . benazepril (LOTENSIN) 40 MG tablet TAKE ONE TABLET BY MOUTH DAILY 90 tablet 2  . cloNIDine (CATAPRES) 0.2 MG tablet TAKE 1 TABLET (0.2 MG TOTAL) BY MOUTH 2 (TWO) TIMES DAILY. 180 tablet 2  . cyclobenzaprine (FLEXERIL) 10 MG tablet Take 1 tablet (10 mg total) by mouth 3 (three) times daily as needed for muscle spasms. 30 tablet 0  . hydrochlorothiazide (HYDRODIURIL) 25 MG tablet TAKE 1 TABLET (25 MG TOTAL) BY MOUTH DAILY. 90 tablet 3  . metoprolol (LOPRESSOR) 50 MG tablet TAKE ONE AND ONE-HALF (1 & 1/2) TABLET BY MOUTH TWO TIMES A DAY 270 tablet 2  . pantoprazole (PROTONIX) 40 MG tablet TAKE 1 TABLET (40 MG TOTAL) BY MOUTH DAILY. 90 tablet 3  . predniSONE (DELTASONE) 2.5 MG tablet Take 2.5 mg by mouth daily with breakfast.    . simvastatin (ZOCOR) 40 MG tablet TAKE ONE TABLET BY MOUTH DAILY 90 tablet 2   No current facility-administered medications for this visit.     LABS/IMAGING: No results found for this or any previous visit (from the past 48 hour(s)). No results found.  VITALS: BP (!) 142/68   Pulse (!) 57   Ht 5' 8.5" (1.74  m)   Wt 163 lb 9.6 oz (74.2 kg)   BMI 24.51 kg/m   EXAM: General appearance: alert and no distress Neck: no carotid bruit, no JVD and thyroid not enlarged, symmetric, no tenderness/mass/nodules Lungs: clear to auscultation bilaterally Heart: regular rate and rhythm, S1, S2 normal, no murmur, click, rub or gallop Abdomen: soft, non-tender; bowel sounds normal; no masses,  no organomegaly Extremities: extremities normal, atraumatic, no cyanosis or edema Pulses: 2+ and symmetric Skin: Skin color, texture, turgor normal. No rashes or lesions Neurologic: Grossly normal Psych: Pleasant  EKG: Sinus bradycardia 57, inferior infarct pattern, PVCs, ST and T wave abdomen minus laterally-personally reviewed, unchanged from prior EKG  ASSESSMENT: 1. Coronary disease status post three-vessel CABG in 2000 2. History of inferior MI  3. Ischemic cardiomyopathy EF 49% (2015) 4. Hypertension 5. Dyslipidemia 6. Diabetes type 2 with a recent A1c of 5.8 (diet controlled)  PLAN: 1.   Mr. Hollern remains adjacent to LAD from a coronary standpoint. Recent lipid profile 3 months ago shows excellent control. Hemoglobin A1c is up somewhat at 6.3 but was better controlled by months ago at 5.8. He is working with his family physician with this. He is asymptomatic and he said no worsening chest pain or shortness of breath. Blood pressure is up slightly today however he says has been well controlled. No changes to his medicines today. Follow-up with me annually or sooner as necessary.  Pixie Casino, MD, [facility] Attending Cardiologist [place] Erandi Lemma 07/15/2016, 9:13 AM

## 2016-07-15 NOTE — Patient Instructions (Signed)
Your physician wants you to follow-up in: ONE YEAR with Dr. Hilty. You will receive a reminder letter in the mail two months in advance. If you don't receive a letter, please call our office to schedule the follow-up appointment.  

## 2016-07-20 ENCOUNTER — Other Ambulatory Visit: Payer: Self-pay | Admitting: Family Medicine

## 2016-10-08 ENCOUNTER — Other Ambulatory Visit: Payer: Medicare Other

## 2016-10-08 ENCOUNTER — Other Ambulatory Visit: Payer: Self-pay | Admitting: Family Medicine

## 2016-10-08 ENCOUNTER — Ambulatory Visit: Payer: Self-pay | Admitting: Family Medicine

## 2016-10-08 DIAGNOSIS — Z79899 Other long term (current) drug therapy: Secondary | ICD-10-CM | POA: Diagnosis not present

## 2016-10-08 DIAGNOSIS — E1165 Type 2 diabetes mellitus with hyperglycemia: Secondary | ICD-10-CM | POA: Diagnosis not present

## 2016-10-08 DIAGNOSIS — I10 Essential (primary) hypertension: Secondary | ICD-10-CM | POA: Diagnosis not present

## 2016-10-08 DIAGNOSIS — E782 Mixed hyperlipidemia: Secondary | ICD-10-CM

## 2016-10-09 LAB — COMPLETE METABOLIC PANEL WITHOUT GFR
AG Ratio: 1.8 (calc) (ref 1.0–2.5)
ALT: 7 U/L — ABNORMAL LOW (ref 9–46)
AST: 11 U/L (ref 10–35)
Albumin: 3.5 g/dL — ABNORMAL LOW (ref 3.6–5.1)
Alkaline phosphatase (APISO): 53 U/L (ref 40–115)
BUN/Creatinine Ratio: 17 (calc) (ref 6–22)
BUN: 21 mg/dL (ref 7–25)
CO2: 26 mmol/L (ref 20–32)
Calcium: 9 mg/dL (ref 8.6–10.3)
Chloride: 106 mmol/L (ref 98–110)
Creat: 1.27 mg/dL — ABNORMAL HIGH (ref 0.70–1.18)
GFR, Est African American: 64 mL/min/1.73m2
GFR, Est Non African American: 55 mL/min/1.73m2 — ABNORMAL LOW
Globulin: 1.9 g/dL (ref 1.9–3.7)
Glucose, Bld: 149 mg/dL — ABNORMAL HIGH (ref 65–99)
Potassium: 4.1 mmol/L (ref 3.5–5.3)
Sodium: 140 mmol/L (ref 135–146)
Total Bilirubin: 0.9 mg/dL (ref 0.2–1.2)
Total Protein: 5.4 g/dL — ABNORMAL LOW (ref 6.1–8.1)

## 2016-10-09 LAB — LIPID PANEL
Cholesterol: 107 mg/dL (ref <200)
HDL: 48 mg/dL (ref >40)
LDL Cholesterol (Calc): 46 mg/dL (calc)
Non-HDL Cholesterol (Calc): 59 mg/dL (calc) (ref <130)
TRIGLYCERIDES: 57 mg/dL (ref <150)
Total CHOL/HDL Ratio: 2.2 (calc) (ref <5.0)

## 2016-10-09 LAB — HEMOGLOBIN A1C
Hgb A1c MFr Bld: 5.7 % of total Hgb — ABNORMAL HIGH (ref <5.7)
Mean Plasma Glucose: 117 (calc)
eAG (mmol/L): 6.5 (calc)

## 2016-10-11 ENCOUNTER — Ambulatory Visit (INDEPENDENT_AMBULATORY_CARE_PROVIDER_SITE_OTHER): Payer: Medicare Other | Admitting: Family Medicine

## 2016-10-11 ENCOUNTER — Encounter: Payer: Self-pay | Admitting: Family Medicine

## 2016-10-11 VITALS — BP 108/60 | HR 60 | Temp 97.7°F | Resp 18 | Wt 164.0 lb

## 2016-10-11 DIAGNOSIS — E119 Type 2 diabetes mellitus without complications: Secondary | ICD-10-CM | POA: Diagnosis not present

## 2016-10-11 DIAGNOSIS — I10 Essential (primary) hypertension: Secondary | ICD-10-CM | POA: Diagnosis not present

## 2016-10-11 DIAGNOSIS — Z23 Encounter for immunization: Secondary | ICD-10-CM | POA: Diagnosis not present

## 2016-10-11 DIAGNOSIS — Z8679 Personal history of other diseases of the circulatory system: Secondary | ICD-10-CM | POA: Diagnosis not present

## 2016-10-11 DIAGNOSIS — E782 Mixed hyperlipidemia: Secondary | ICD-10-CM | POA: Diagnosis not present

## 2016-10-11 DIAGNOSIS — I255 Ischemic cardiomyopathy: Secondary | ICD-10-CM

## 2016-10-11 DIAGNOSIS — I5022 Chronic systolic (congestive) heart failure: Secondary | ICD-10-CM

## 2016-10-11 NOTE — Progress Notes (Signed)
Subjective:    Patient ID: Donald Rios, male    DOB: April 07, 1940, 76 y.o.   MRN: [id number]  Medication Refill   Here today for recheck of his medical problems. His blood pressure today is well controlled. He denies any chest pain shortness of breath or dyspnea on exertion. In his record as documented history of congestive heart failure. Previous ejection fraction is documented 45%. However the patient had a Myoview performed in 2012 that showed an ejection fraction about 50%. He is completely asymptomatic. He denies any orthopnea or paroxysmal nocturnal dyspnea. He has no limitation as far as exercise capacity. Therefore I do not believe the patient would benefit from switching to [facility].  Regarding his diabetes, his most recent hemoglobin A1c was 5.7. He denies any neuropathy in his feet. He denies any numbness or tingling. He denies any polyuria, polydipsia, or blurry vision.  Diabetic foot exam is performed today and is normal although he does have a slightly weaker pulse on the dorsalis pedis in his left foot is still palpable. He also has a history of hyperlipidemia. He is currently on simvastatin. He denies any significant myalgias although he does have stiffness in the joints on his hands bilaterally. His most recent lab work shows an LDL cholesterol of 45 which is well below his goal of 70.  Past Medical History:  Diagnosis Date  . Acute gastric ulcer with hemorrhage 05/06/2013  . Asthma   . Atrial enlargement, left   . 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 Diabetes mellitus   . Diabetic nephropathy ([place])   . Diverticulosis   . ED (erectile dysfunction)   . GERD (gastroesophageal reflux disease)   . Gout   . Hearing loss   . Hiatal hernia 05/2008   EGD with HH and reflux esophagitis.   Marland Kitchen History of MI (myocardial infarction)   . History of nuclear stress test 08/07/2010   dipyridamole; EKG negative for ischemia, low risk scan   . Hyperlipidemia   . Hyperplastic  colon polyp 05/2008  . Hypertension   . Ischemic cardiomyopathy    EF 45%, with inferior wall motion abnormality   . Valvular regurgitation    mitral and tricuspid (mild)   Past Surgical History:  Procedure Laterality Date  . CORONARY ARTERY BYPASS GRAFT  2000   LIMA to LAD, free IMA to OM2, sequential graft to PLA & PLD  . ESOPHAGOGASTRODUODENOSCOPY N/A 05/07/2013   Procedure: ESOPHAGOGASTRODUODENOSCOPY (EGD);  Surgeon: Gatha Mayer, MD;  Location: [facility] ENDOSCOPY;  Service: Endoscopy;  Laterality: N/A;  . TRANSTHORACIC ECHOCARDIOGRAM  09/07/2010   EF 97-67%; LV systolic function mildly reduced; LA mildly dilated; mild-mod MR & mild-mod TR; aortic root sclerosis/calcification;    Current Outpatient Prescriptions on File Prior to Visit  Medication Sig Dispense Refill  . allopurinol (ZYLOPRIM) 300 MG tablet TAKE ONE TABLET BY MOUTH DAILY 90 tablet 2  . ALPRAZolam (XANAX) 0.5 MG tablet Take 1 tablet (0.5 mg total) by mouth at bedtime as needed for anxiety. 30 tablet 2  . aspirin 81 MG tablet Take 81 mg by mouth daily.    . benazepril (LOTENSIN) 40 MG tablet TAKE ONE TABLET BY MOUTH DAILY 90 tablet 2  . cloNIDine (CATAPRES) 0.2 MG tablet TAKE 1 TABLET (0.2 MG TOTAL) BY MOUTH 2 (TWO) TIMES DAILY. 180 tablet 1  . cyclobenzaprine (FLEXERIL) 10 MG tablet Take 1 tablet (10 mg total) by mouth 3 (three) times daily as needed for muscle spasms. [place]  tablet 0  . hydrochlorothiazide (HYDRODIURIL) 25 MG tablet TAKE 1 TABLET (25 MG TOTAL) BY MOUTH DAILY. 90 tablet 3  . metoprolol (LOPRESSOR) 50 MG tablet TAKE ONE AND ONE-HALF (1 & 1/2) TABLET BY MOUTH TWO TIMES A DAY 270 tablet 2  . pantoprazole (PROTONIX) 40 MG tablet TAKE 1 TABLET (40 MG TOTAL) BY MOUTH DAILY. 90 tablet 3  . simvastatin (ZOCOR) 40 MG tablet TAKE ONE TABLET BY MOUTH DAILY 90 tablet 2   No current facility-administered medications on file prior to visit.    No Known Allergies Social History   Social History  . Marital status: Married      Spouse name: N/A  . Number of children: 1  . Years of education: N/A   Occupational History  . Not on file.   Social History Main Topics  . Smoking status: Former Smoker    Quit date: 11/04/1985  . Smokeless tobacco: Never Used  . Alcohol use 1.2 - 1.8 oz/week    2 - 3 Cans of beer per week  . Drug use: No  . Sexual activity: Not on file   Other Topics Concern  . Not on file   Social History Narrative  . No narrative on file     Review of Systems  All other systems reviewed and are negative.      Objective:   Physical Exam  Constitutional: He appears well-developed and well-nourished.  Neck: Neck supple. No JVD present. No thyromegaly present.  Cardiovascular: Normal rate, regular rhythm, normal heart sounds and intact distal pulses.   No murmur heard. Pulmonary/Chest: Effort normal and breath sounds normal. No respiratory distress. He has no wheezes. He has no rales. He exhibits no tenderness.  Abdominal: Soft. Bowel sounds are normal. He exhibits no distension and no mass. There is no tenderness. There is no rebound and no guarding.  Musculoskeletal: He exhibits no edema.  Lymphadenopathy:    He has no cervical adenopathy.  Skin: No rash noted.  Vitals reviewed.         Assessment & Plan:  Chronic systolic congestive heart failure, NYHA class 1 (HCC)  Type 2 diabetes mellitus without complication, without long-term current use of insulin (HCC)  Mixed hyperlipidemia  Benign essential HTN  History of ASCVD  Appointment on 10/08/2016  Component Date Value Ref Range Status  . Hgb A1c MFr Bld 10/08/2016 5.7* <5.7 % of total Hgb Final   Comment: For someone without known diabetes, a hemoglobin  A1c value between 5.7% and 6.4% is consistent with prediabetes and should be confirmed with a  follow-up test. . For someone with known diabetes, a value <7% indicates that their diabetes is well controlled. A1c targets should be individualized based on  duration of diabetes, age, comorbid conditions, and other considerations. . This assay result is consistent with an increased risk of diabetes. . Currently, no consensus exists regarding use of hemoglobin A1c for diagnosis of diabetes for children. .   . Mean Plasma Glucose 10/08/2016 117  (calc) Final  . eAG (mmol/L) 10/08/2016 6.5  (calc) Final  . Cholesterol 10/08/2016 107  <200 mg/dL Final  . HDL 10/08/2016 48  >40 mg/dL Final  . Triglycerides 10/08/2016 57  <150 mg/dL Final  . LDL Cholesterol (Calc) 10/08/2016 46  mg/dL (calc) Final   Comment: Reference range: <100 . Desirable range <100 mg/dL for primary prevention;   <70 mg/dL for patients with CHD or diabetic patients  with > or = 2 CHD risk factors. Marland Kitchen  LDL-C is now calculated using the Martin-Hopkins  calculation, which is a validated novel method providing  better accuracy than the Friedewald equation in the  estimation of LDL-C.  Cresenciano Genre et al. Annamaria Helling. 8413;244(01): 2061-2068  (http://education.[employer].com/faq/FAQ164)   . Total CHOL/HDL Ratio 10/08/2016 2.2  <5.0 (calc) Final  . Non-HDL Cholesterol (Calc) 10/08/2016 59  <130 mg/dL (calc) Final   Comment: For patients with diabetes plus 1 major ASCVD risk  factor, treating to a non-HDL-C goal of <100 mg/dL  (LDL-C of <70 mg/dL) is considered a therapeutic  option.   . Glucose, Bld 10/08/2016 149* 65 - 99 mg/dL Final   Comment: .            Fasting reference interval . For someone without known diabetes, a glucose value >125 mg/dL indicates that they may have diabetes and this should be confirmed with a follow-up test. .   . BUN 10/08/2016 21  7 - 25 mg/dL Final  . Creat 10/08/2016 1.27* 0.70 - 1.18 mg/dL Final   Comment: For patients >34 years of age, the reference limit for Creatinine is approximately 13% higher for people identified as African-American. .   . GFR, Est Non African American 10/08/2016 55* > OR = 60 mL/min/1.34m2 Final  . GFR,  Est African American 10/08/2016 64  > OR = 60 mL/min/1.97m2 Final  . BUN/Creatinine Ratio 10/08/2016 17  6 - 22 (calc) Final  . Sodium 10/08/2016 140  135 - 146 mmol/L Final  . Potassium 10/08/2016 4.1  3.5 - 5.3 mmol/L Final  . Chloride 10/08/2016 106  98 - 110 mmol/L Final  . CO2 10/08/2016 26  20 - 32 mmol/L Final  . Calcium 10/08/2016 9.0  8.6 - 10.3 mg/dL Final  . Total Protein 10/08/2016 5.4* 6.1 - 8.1 g/dL Final  . Albumin 10/08/2016 3.5* 3.6 - 5.1 g/dL Final  . Globulin 10/08/2016 1.9  1.9 - 3.7 g/dL (calc) Final  . AG Ratio 10/08/2016 1.8  1.0 - 2.5 (calc) Final  . Total Bilirubin 10/08/2016 0.9  0.2 - 1.2 mg/dL Final  . Alkaline phosphatase (APISO) 10/08/2016 53  40 - 115 U/L Final  . AST 10/08/2016 11  10 - 35 U/L Final  . ALT 10/08/2016 7* 9 - 46 U/L Final   Blood pressure today is well controlled. Therefore I'll make no changes in his antihypertensives. Even though the patient has a documented history of congestive heart failure, his most recent ejection fraction as above 45%. Furthermore he is asymptomatic. Therefore I see no reason to switch the patient to [facility].  He denies any myalgias. His LDL cholesterol is well controlled. Therefore I will keep the patient on simvastatin. Diabetic foot exam today is normal. Recommended annual diabetic eye exam. Patient received his flu shot. The remainder of his lab work is excellent. Follow-up in 6 months or as needed. His only complaint today is some stiffness in the joints on both hands both the DIP and PIP joints. This is mild. At the present time he has no interest in arthritis drugs nor would I recommend him given his mild chronic kidney disease. We discussed possibly proceeding with x-rays but I suspect osteoarthritis. At the present time he will just simply like to monitor it and if it gets worse then proceed with x-rays

## 2016-10-11 NOTE — Addendum Note (Signed)
Addended by: Vonna Kotyk A on: 10/11/2016 08:46 AM   Modules accepted: Orders

## 2016-12-09 ENCOUNTER — Encounter: Payer: Self-pay | Admitting: Podiatry

## 2016-12-09 ENCOUNTER — Ambulatory Visit (INDEPENDENT_AMBULATORY_CARE_PROVIDER_SITE_OTHER): Payer: Medicare Other | Admitting: Podiatry

## 2016-12-09 VITALS — BP 140/67 | HR 59 | Ht 68.0 in | Wt 157.0 lb

## 2016-12-09 DIAGNOSIS — B351 Tinea unguium: Secondary | ICD-10-CM

## 2016-12-09 DIAGNOSIS — M2042 Other hammer toe(s) (acquired), left foot: Secondary | ICD-10-CM

## 2016-12-09 DIAGNOSIS — M2041 Other hammer toe(s) (acquired), right foot: Secondary | ICD-10-CM | POA: Diagnosis not present

## 2016-12-09 DIAGNOSIS — E1151 Type 2 diabetes mellitus with diabetic peripheral angiopathy without gangrene: Secondary | ICD-10-CM

## 2016-12-09 NOTE — Progress Notes (Signed)
Subjective:  Patient ID: Donald Rios, male    DOB: 05-25-1940,  MRN: [id number]  Chief Complaint  Patient presents with  . Diabetes    diabetic foot exam - last A1C 5.7   76 y.o. male returns for diabetic foot care. Last A1C was 5.7.  Denies numbness and tingling in their feet. Denies cramping in legs and thighs.  Past Medical History:  Diagnosis Date  . Acute gastric ulcer with hemorrhage 05/06/2013  . Asthma   . Atrial enlargement, left   . 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 Diabetes mellitus   . Diabetic nephropathy ([place])   . Diverticulosis   . ED (erectile dysfunction)   . GERD (gastroesophageal reflux disease)   . Gout   . Hearing loss   . Hiatal hernia 05/2008   EGD with HH and reflux esophagitis.   Marland Kitchen History of MI (myocardial infarction)   . History of nuclear stress test 08/07/2010   dipyridamole; EKG negative for ischemia, low risk scan   . Hyperlipidemia   . Hyperplastic colon polyp 05/2008  . Hypertension   . Ischemic cardiomyopathy    EF 45%, with inferior wall motion abnormality   . Valvular regurgitation    mitral and tricuspid (mild)   Past Surgical History:  Procedure Laterality Date  . CORONARY ARTERY BYPASS GRAFT  2000   LIMA to LAD, free IMA to OM2, sequential graft to PLA & PLD  . ESOPHAGOGASTRODUODENOSCOPY N/A 05/07/2013   Procedure: ESOPHAGOGASTRODUODENOSCOPY (EGD);  Surgeon: Gatha Mayer, MD;  Location: [facility] ENDOSCOPY;  Service: Endoscopy;  Laterality: N/A;  . TRANSTHORACIC ECHOCARDIOGRAM  09/07/2010   EF 29-51%; LV systolic function mildly reduced; LA mildly dilated; mild-mod MR & mild-mod TR; aortic root sclerosis/calcification;     Current Outpatient Medications:  .  allopurinol (ZYLOPRIM) 300 MG tablet, TAKE ONE TABLET BY MOUTH DAILY, Disp: 90 tablet, Rfl: 2 .  ALPRAZolam (XANAX) 0.5 MG tablet, Take 1 tablet (0.5 mg total) by mouth at bedtime as needed for anxiety., Disp: 30 tablet, Rfl: 2 .  aspirin 81 MG tablet, Take 81 mg  by mouth daily., Disp: , Rfl:  .  benazepril (LOTENSIN) 40 MG tablet, TAKE ONE TABLET BY MOUTH DAILY, Disp: 90 tablet, Rfl: 2 .  cloNIDine (CATAPRES) 0.2 MG tablet, TAKE 1 TABLET (0.2 MG TOTAL) BY MOUTH 2 (TWO) TIMES DAILY., Disp: 180 tablet, Rfl: 1 .  cyclobenzaprine (FLEXERIL) 10 MG tablet, Take 1 tablet (10 mg total) by mouth 3 (three) times daily as needed for muscle spasms., Disp: 30 tablet, Rfl: 0 .  hydrochlorothiazide (HYDRODIURIL) 25 MG tablet, TAKE 1 TABLET (25 MG TOTAL) BY MOUTH DAILY., Disp: 90 tablet, Rfl: 3 .  metoprolol (LOPRESSOR) 50 MG tablet, TAKE ONE AND ONE-HALF (1 & 1/2) TABLET BY MOUTH TWO TIMES A DAY, Disp: 270 tablet, Rfl: 2 .  pantoprazole (PROTONIX) 40 MG tablet, TAKE 1 TABLET (40 MG TOTAL) BY MOUTH DAILY., Disp: 90 tablet, Rfl: 3 .  simvastatin (ZOCOR) 40 MG tablet, TAKE ONE TABLET BY MOUTH DAILY, Disp: 90 tablet, Rfl: 2  No Known Allergies  Objective:   Vitals:   12/09/16 1012  BP: 140/67  Pulse: (!) 59   General AA&O x3. Normal mood and affect.  Vascular Dorsalis pedis pulses present 1+ bilaterally  Posterior tibial pulses absent bilaterally  Capillary refill normal to all digits. Pedal hair growth diminished.  Neurologic Epicritic sensation present bilaterally. Protective sensation with 5.07 monofilament  present bilaterally. Vibratory sensation present bilaterally.  Dermatologic No open lesions. Interspaces clear of maceration.  Normal skin temperature and turgor. Hyperkeratotic lesions: none bilaterally. Nails: brittle, discoloration yellow, onychomycosis, thickening  Orthopedic: No history of amputation. MMT 5/5 in dorsiflexion, plantarflexion, inversion, and eversion. Normal lower extremity joint ROM without pain or crepitus. Lesser digital contractures bilat.   Assessment & Plan:  Patient was evaluated and treated and all questions answered.  Diabetes with PAD, Onychomycosis -Educated on diabetic footcare. Diabetic risk level 1 -At risk foot  care provided as below. -Will fabricate DM shoes and inserts. Rx filled out.  Procedure: Nail Debridement Rationale: Patient meets criteria for routine foot care due to Class B findings Type of Debridement: manual, sharp debridement. Instrumentation: Nail nipper, rotary burr. Number of Nails: 10  Return in about 3 months (around 03/10/2017) for Diabetic Foot Care.

## 2017-01-06 ENCOUNTER — Encounter: Payer: Self-pay | Admitting: Family Medicine

## 2017-01-06 ENCOUNTER — Ambulatory Visit (INDEPENDENT_AMBULATORY_CARE_PROVIDER_SITE_OTHER): Payer: Medicare Other | Admitting: Family Medicine

## 2017-01-06 VITALS — BP 120/60 | HR 78 | Temp 98.4°F | Resp 14 | Ht 68.5 in | Wt 161.0 lb

## 2017-01-06 DIAGNOSIS — I255 Ischemic cardiomyopathy: Secondary | ICD-10-CM | POA: Diagnosis not present

## 2017-01-06 DIAGNOSIS — M2550 Pain in unspecified joint: Secondary | ICD-10-CM | POA: Diagnosis not present

## 2017-01-06 NOTE — Progress Notes (Signed)
Subjective:    Patient ID: Donald Rios, male    DOB: 02/11/1940, 76 y.o.   MRN: [id number]  Medication Refill   Arthritis   10/11/16 Here today for recheck of his medical problems. His blood pressure today is well controlled. He denies any chest pain shortness of breath or dyspnea on exertion. In his record as documented history of congestive heart failure. Previous ejection fraction is documented 45%. However the patient had a Myoview performed in 2012 that showed an ejection fraction about 50%. He is completely asymptomatic. He denies any orthopnea or paroxysmal nocturnal dyspnea. He has no limitation as far as exercise capacity. Therefore I do not believe the patient would benefit from switching to [facility].  Regarding his diabetes, his most recent hemoglobin A1c was 5.7. He denies any neuropathy in his feet. He denies any numbness or tingling. He denies any polyuria, polydipsia, or blurry vision.  Diabetic foot exam is performed today and is normal although he does have a slightly weaker pulse on the dorsalis pedis in his left foot is still palpable. He also has a history of hyperlipidemia. He is currently on simvastatin. He denies any significant myalgias although he does have stiffness in the joints on his hands bilaterally. His most recent lab work shows an LDL cholesterol of 45 which is well below his goal of 70.  At that time, my plan was: Blood pressure today is well controlled. Therefore I'll make no changes in his antihypertensives. Even though the patient has a documented history of congestive heart failure, his most recent ejection fraction as above 45%. Furthermore he is asymptomatic. Therefore I see no reason to switch the patient to [facility].  He denies any myalgias. His LDL cholesterol is well controlled. Therefore I will keep the patient on simvastatin. Diabetic foot exam today is normal. Recommended annual diabetic eye exam. Patient received his flu shot. The remainder of his lab  work is excellent. Follow-up in 6 months or as needed. His only complaint today is some stiffness in the joints on both hands both the DIP and PIP joints. This is mild. At the present time he has no interest in arthritis drugs nor would I recommend him given his mild chronic kidney disease. We discussed possibly proceeding with x-rays but I suspect osteoarthritis. At the present time he will just simply like to monitor it and if it gets worse then proceed with x-rays   01/06/17 Patient continues to complain of severe pain all over his body. Pain is mainly in the joints on both hands. It is the PIP joints and MCP joints in particular. It is worse first thing in the morning but gradually improves throughout the day. He also has pain in his elbows right greater than left. He is noticed a decrease in his grip strength and is having a difficult time performing movements that require dexterity such as opening a bottle. He also complains of pain in his shoulders and in his knees. He denies any hip pain. 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 He denies any fevers or chills or headaches or blurry vision. He does have chronic pain in both shoulders however he denies any hip or lower back pain. However his joint pains and muscle pain seemed to be symmetric and bilateral that are primarily focused on the small joints in his hands and in his elbows. He does have mild chronic kidney disease with a GFR 54 and also a  history of an acute GI bleed from a gastric ulcer making NSAIDs problematic Past Medical History:  Diagnosis Date  . Acute gastric ulcer with hemorrhage 05/06/2013  . Asthma   . Atrial enlargement, left   . 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 Diabetes mellitus   . Diabetic nephropathy ([place])   . Diverticulosis   . ED (erectile dysfunction)   . GERD (gastroesophageal reflux disease)   . Gout   . Hearing loss   . Hiatal hernia 05/2008   EGD with  HH and reflux esophagitis.   Marland Kitchen History of MI (myocardial infarction)   . History of nuclear stress test 08/07/2010   dipyridamole; EKG negative for ischemia, low risk scan   . Hyperlipidemia   . Hyperplastic colon polyp 05/2008  . Hypertension   . Ischemic cardiomyopathy    EF 45%, with inferior wall motion abnormality   . Valvular regurgitation    mitral and tricuspid (mild)   Past Surgical History:  Procedure Laterality Date  . CORONARY ARTERY BYPASS GRAFT  2000   LIMA to LAD, free IMA to OM2, sequential graft to PLA & PLD  . ESOPHAGOGASTRODUODENOSCOPY N/A 05/07/2013   Procedure: ESOPHAGOGASTRODUODENOSCOPY (EGD);  Surgeon: Gatha Mayer, MD;  Location: [facility] ENDOSCOPY;  Service: Endoscopy;  Laterality: N/A;  . TRANSTHORACIC ECHOCARDIOGRAM  09/07/2010   EF 18-84%; LV systolic function mildly reduced; LA mildly dilated; mild-mod MR & mild-mod TR; aortic root sclerosis/calcification;    Current Outpatient Medications on File Prior to Visit  Medication Sig Dispense Refill  . allopurinol (ZYLOPRIM) 300 MG tablet TAKE ONE TABLET BY MOUTH DAILY 90 tablet 2  . ALPRAZolam (XANAX) 0.5 MG tablet Take 1 tablet (0.5 mg total) by mouth at bedtime as needed for anxiety. 30 tablet 2  . aspirin 81 MG tablet Take 81 mg by mouth daily.    . benazepril (LOTENSIN) 40 MG tablet TAKE ONE TABLET BY MOUTH DAILY 90 tablet 2  . cloNIDine (CATAPRES) 0.2 MG tablet TAKE 1 TABLET (0.2 MG TOTAL) BY MOUTH 2 (TWO) TIMES DAILY. 180 tablet 1  . hydrochlorothiazide (HYDRODIURIL) 25 MG tablet TAKE 1 TABLET (25 MG TOTAL) BY MOUTH DAILY. 90 tablet 3  . metoprolol (LOPRESSOR) 50 MG tablet TAKE ONE AND ONE-HALF (1 & 1/2) TABLET BY MOUTH TWO TIMES A DAY 270 tablet 2  . pantoprazole (PROTONIX) 40 MG tablet TAKE 1 TABLET (40 MG TOTAL) BY MOUTH DAILY. 90 tablet 3  . simvastatin (ZOCOR) 40 MG tablet TAKE ONE TABLET BY MOUTH DAILY 90 tablet 2   No current facility-administered medications on file prior to visit.    No Known  Allergies Social History   Socioeconomic History  . Marital status: Married    Spouse name: Not on file  . Number of children: 1  . Years of education: Not on file  . Highest education level: Not on file  Social Needs  . Financial resource strain: Not on file  . Food insecurity - worry: Not on file  . Food insecurity - inability: Not on file  . Transportation needs - medical: Not on file  . Transportation needs - non-medical: Not on file  Occupational History  . Not on file  Tobacco Use  . Smoking status: Former Smoker    Last attempt to quit: 11/04/1985    Years since quitting: 31.1  . Smokeless tobacco: Never Used  Substance and Sexual Activity  . Alcohol use: Yes    Alcohol/week: 1.2 - 1.8 oz  Types: 2 - 3 Cans of beer per week  . Drug use: No  . Sexual activity: Not on file  Other Topics Concern  . Not on file  Social History Narrative  . Not on file     Review of Systems  Musculoskeletal: Positive for arthritis.  All other systems reviewed and are negative.      Objective:   Physical Exam  Constitutional: He appears well-developed and well-nourished.  Neck: Neck supple. No JVD present. No thyromegaly present.  Cardiovascular: Normal rate, regular rhythm, normal heart sounds and intact distal pulses.  No murmur heard. Pulmonary/Chest: Effort normal and breath sounds normal. No respiratory distress. He has no wheezes. He has no rales. He exhibits no tenderness.  Abdominal: Soft. Bowel sounds are normal. He exhibits no distension and no mass. There is no tenderness. There is no rebound and no guarding.  Musculoskeletal: He exhibits no edema.  Lymphadenopathy:    He has no cervical adenopathy.  Skin: No rash noted.  Vitals reviewed.         Assessment & Plan:  Polyarthralgia - Plan: Sedimentation rate, Rheumatoid factor, CK, ANA  I see no active erythema or warmth or swelling in any joint today. Subjectively he is having pain and decreased range of  motion primarily in his hands. I will evaluate for rheumatoid arthritis with rheumatoid factor. I will evaluate for evidence of PMR with sedimentation rates or other autoimmune diseases with a sedimentation rate and an ANA. I will also check a CK for stent-induced myopathy. If lab work is normal, I will consider Voltaren gel for his hands versus putting him on diclofenac and using it sparingly. We did have a long discussion about the risk of taking NSAID particular GI bleed and also renal toxicity. Await the results of the lab work before making further decision

## 2017-01-07 ENCOUNTER — Other Ambulatory Visit: Payer: Self-pay | Admitting: Family Medicine

## 2017-01-07 LAB — ANA: ANA: NEGATIVE

## 2017-01-07 LAB — CK: CK TOTAL: 47 U/L (ref 44–196)

## 2017-01-07 LAB — RHEUMATOID FACTOR: Rhuematoid fact SerPl-aCnc: <14 IU/mL (ref <14)

## 2017-01-07 LAB — SEDIMENTATION RATE: Sed Rate: 22 mm/h — ABNORMAL HIGH (ref 0–20)

## 2017-01-07 MED ORDER — DICLOFENAC SODIUM 1 % TD GEL: TRANSDERMAL | 5 refills | Status: DC

## 2017-01-22 ENCOUNTER — Other Ambulatory Visit: Payer: Self-pay | Admitting: Family Medicine

## 2017-02-05 ENCOUNTER — Other Ambulatory Visit: Payer: Self-pay | Admitting: Family Medicine

## 2017-02-26 ENCOUNTER — Other Ambulatory Visit: Payer: Self-pay | Admitting: Family Medicine

## 2017-03-08 DIAGNOSIS — E119 Type 2 diabetes mellitus without complications: Secondary | ICD-10-CM | POA: Diagnosis not present

## 2017-03-08 LAB — HM DIABETES EYE EXAM

## 2017-03-11 ENCOUNTER — Ambulatory Visit: Payer: Medicare Other | Admitting: Podiatry

## 2017-03-17 ENCOUNTER — Ambulatory Visit (INDEPENDENT_AMBULATORY_CARE_PROVIDER_SITE_OTHER): Payer: Medicare Other | Admitting: Podiatry

## 2017-03-17 DIAGNOSIS — B351 Tinea unguium: Secondary | ICD-10-CM | POA: Diagnosis not present

## 2017-03-17 DIAGNOSIS — E1151 Type 2 diabetes mellitus with diabetic peripheral angiopathy without gangrene: Secondary | ICD-10-CM

## 2017-03-17 NOTE — Progress Notes (Signed)
  Subjective:  Patient ID: Donald Rios, male    DOB: Apr 07, 1940,  MRN: [id number]  Chief Complaint  Patient presents with  . debride    B/l debride Pt. stated,: diabetes type 2 sugar" controlled" A1C: 5.6 ."   77 y.o. male returns for diabetic foot care. Last A1c was 5.7. Denies numbness and tingling in their feet. Denies cramping in legs and thighs.  Objective:   General AA&O x3. Normal mood and affect.  Vascular Dorsalis pedis pulses present 1+ bilaterally  Posterior tibial pulses absent bilaterally  Capillary refill normal to all digits. Pedal hair growth normal.  Neurologic Epicritic sensation present bilaterally. Protective sensation with 5.07 monofilament  present bilaterally. Vibratory sensation present bilaterally.  Dermatologic No open lesions. Interspaces clear of maceration.  Normal skin temperature and turgor. Hyperkeratotic lesions: None bilaterally. Nails: brittle, onychomycosis, thickening, elongation  Orthopedic: No history of amputation. MMT 5/5 in dorsiflexion, plantarflexion, inversion, and eversion. Normal lower extremity joint ROM without pain or crepitus.    Assessment & Plan:  Patient was evaluated and treated and all questions answered.  Diabetes with PAD, Onychomycosis -Educated on diabetic footcare. Diabetic risk level 1 -At risk foot care provided as below.  Procedure: Nail Debridement Rationale: Patient meets criteria for routine foot care due to Class B findings. Type of Debridement: manual, sharp debridement. Instrumentation: Nail nipper, rotary burr. Number of Nails: 10  No Follow-up on file.

## 2017-03-23 ENCOUNTER — Encounter: Payer: Self-pay | Admitting: *Deleted

## 2017-04-08 ENCOUNTER — Other Ambulatory Visit: Payer: Medicare Other

## 2017-04-08 DIAGNOSIS — E782 Mixed hyperlipidemia: Secondary | ICD-10-CM

## 2017-04-08 DIAGNOSIS — Z79899 Other long term (current) drug therapy: Secondary | ICD-10-CM

## 2017-04-08 DIAGNOSIS — E119 Type 2 diabetes mellitus without complications: Secondary | ICD-10-CM

## 2017-04-08 DIAGNOSIS — Z125 Encounter for screening for malignant neoplasm of prostate: Secondary | ICD-10-CM

## 2017-04-08 DIAGNOSIS — I10 Essential (primary) hypertension: Secondary | ICD-10-CM

## 2017-04-09 LAB — MICROALBUMIN / CREATININE URINE RATIO
Creatinine, Urine: 87 mg/dL (ref 20–320)
Microalb Creat Ratio: 5 mcg/mg creat (ref <30)
Microalb, Ur: 0.4 mg/dL

## 2017-04-09 LAB — HEMOGLOBIN A1C
EAG (MMOL/L): 7 (calc)
Hgb A1c MFr Bld: 6 % of total Hgb — ABNORMAL HIGH (ref <5.7)
Mean Plasma Glucose: 126 (calc)

## 2017-04-09 LAB — COMPLETE METABOLIC PANEL WITH GFR
AG RATIO: 1.9 (calc) (ref 1.0–2.5)
ALBUMIN MSPROF: 3.9 g/dL (ref 3.6–5.1)
ALT: 8 U/L — ABNORMAL LOW (ref 9–46)
AST: 15 U/L (ref 10–35)
Alkaline phosphatase (APISO): 62 U/L (ref 40–115)
BILIRUBIN TOTAL: 1.1 mg/dL (ref 0.2–1.2)
BUN: 18 mg/dL (ref 7–25)
CHLORIDE: 109 mmol/L (ref 98–110)
CO2: 27 mmol/L (ref 20–32)
Calcium: 9.3 mg/dL (ref 8.6–10.3)
Creat: 1.16 mg/dL (ref 0.70–1.18)
GFR, EST AFRICAN AMERICAN: 71 mL/min/{1.73_m2} (ref >=60)
GFR, Est Non African American: 61 mL/min/{1.73_m2} (ref >=60)
GLOBULIN: 2.1 g/dL (ref 1.9–3.7)
GLUCOSE: 125 mg/dL — AB (ref 65–99)
Potassium: 4.7 mmol/L (ref 3.5–5.3)
SODIUM: 143 mmol/L (ref 135–146)
Total Protein: 6 g/dL — ABNORMAL LOW (ref 6.1–8.1)

## 2017-04-09 LAB — CBC WITH DIFFERENTIAL/PLATELET
BASOS ABS: 21 {cells}/uL (ref 0–200)
Basophils Relative: 0.3 %
EOS ABS: 249 {cells}/uL (ref 15–500)
Eosinophils Relative: 3.5 %
HEMATOCRIT: 38.2 % — AB (ref 38.5–50.0)
HEMOGLOBIN: 12.8 g/dL — AB (ref 13.2–17.1)
Lymphs Abs: 1569 cells/uL (ref 850–3900)
MCH: 30.7 pg (ref 27.0–33.0)
MCHC: 33.5 g/dL (ref 32.0–36.0)
MCV: 91.6 fL (ref 80.0–100.0)
MONOS PCT: 7.5 %
MPV: 12 fL (ref 7.5–12.5)
NEUTROS ABS: 4729 {cells}/uL (ref 1500–7800)
Neutrophils Relative %: 66.6 %
Platelets: 204 10*3/uL (ref 140–400)
RBC: 4.17 10*6/uL — ABNORMAL LOW (ref 4.20–5.80)
RDW: 13.3 % (ref 11.0–15.0)
Total Lymphocyte: 22.1 %
WBC: 7.1 10*3/uL (ref 3.8–10.8)
WBCMIX: 533 {cells}/uL (ref 200–950)

## 2017-04-09 LAB — LIPID PANEL
CHOL/HDL RATIO: 2.2 (calc) (ref <5.0)
Cholesterol: 115 mg/dL (ref <200)
HDL: 52 mg/dL (ref >40)
LDL Cholesterol (Calc): 49 mg/dL (calc)
NON-HDL CHOLESTEROL (CALC): 63 mg/dL (ref <130)
TRIGLYCERIDES: 55 mg/dL (ref <150)

## 2017-04-09 LAB — PSA: PSA: 1.1 ng/mL (ref <=4.0)

## 2017-04-11 ENCOUNTER — Encounter: Payer: Self-pay | Admitting: Family Medicine

## 2017-04-11 ENCOUNTER — Ambulatory Visit (INDEPENDENT_AMBULATORY_CARE_PROVIDER_SITE_OTHER): Payer: Medicare Other | Admitting: Family Medicine

## 2017-04-11 VITALS — BP 120/58 | HR 58 | Temp 97.9°F | Resp 14 | Ht 68.5 in | Wt 161.0 lb

## 2017-04-11 DIAGNOSIS — E782 Mixed hyperlipidemia: Secondary | ICD-10-CM | POA: Diagnosis not present

## 2017-04-11 DIAGNOSIS — I10 Essential (primary) hypertension: Secondary | ICD-10-CM

## 2017-04-11 DIAGNOSIS — E119 Type 2 diabetes mellitus without complications: Secondary | ICD-10-CM

## 2017-04-11 MED ORDER — CLOTRIMAZOLE-BETAMETHASONE 1-0.05 % EX CREA: 1.0000 "application " | TOPICAL_CREAM | Freq: Two times a day (BID) | CUTANEOUS | 0 refills | Status: DC

## 2017-04-11 NOTE — Progress Notes (Signed)
Subjective:    Patient ID: Donald Rios, male    DOB: 08-Jul-1940, 77 y.o.   MRN: [id number]  Medication Refill   Here today for recheck of his medical problems. His blood pressure today is well controlled at 120/58.  He denies any chest pain shortness of breath or dyspnea on exertion. In his record as documented history of congestive heart failure. Previous ejection fraction is documented 45%. However the patient had a Myoview performed in 2012 that showed an ejection fraction about 50%. He is completely asymptomatic. He denies any orthopnea or paroxysmal nocturnal dyspnea. He has no limitation as far as exercise capacity. His most recent hemoglobin A1c was 6.0. He denies any neuropathy in his feet. He denies any numbness or tingling. He is now seeing a podiatrist.  He denies any polyuria, polydipsia, or blurry vision.  He also has a history of hyperlipidemia. He is currently on simvastatin. He denies any significant myalgias. His most recent lab work shows an LDL cholesterol of 45 which is well below his goal of 70. Lab on 04/08/2017  Component Date Value Ref Range Status  . WBC 04/08/2017 7.1  3.8 - 10.8 Thousand/uL Final  . RBC 04/08/2017 4.17* 4.20 - 5.80 Million/uL Final  . Hemoglobin 04/08/2017 12.8* 13.2 - 17.1 g/dL Final  . HCT 04/08/2017 38.2* 38.5 - 50.0 % Final  . MCV 04/08/2017 91.6  80.0 - 100.0 fL Final  . MCH 04/08/2017 30.7  27.0 - 33.0 pg Final  . MCHC 04/08/2017 33.5  32.0 - 36.0 g/dL Final  . RDW 04/08/2017 13.3  11.0 - 15.0 % Final  . Platelets 04/08/2017 204  140 - 400 Thousand/uL Final  . MPV 04/08/2017 12.0  7.5 - 12.5 fL Final  . Neutro Abs 04/08/2017 4,729  1,500 - 7,800 cells/uL Final  . Lymphs Abs 04/08/2017 1,569  850 - 3,900 cells/uL Final  . WBC mixed population 04/08/2017 533  200 - 950 cells/uL Final  . Eosinophils Absolute 04/08/2017 249  15 - 500 cells/uL Final  . Basophils Absolute 04/08/2017 21  0 - 200 cells/uL Final  . Neutrophils Relative %  04/08/2017 66.6  % Final  . Total Lymphocyte 04/08/2017 22.1  % Final  . Monocytes Relative 04/08/2017 7.5  % Final  . Eosinophils Relative 04/08/2017 3.5  % Final  . Basophils Relative 04/08/2017 0.3  % Final  . Glucose, Bld 04/08/2017 125* 65 - 99 mg/dL Final   Comment: .            Fasting reference interval . For someone without known diabetes, a glucose value between 100 and 125 mg/dL is consistent with prediabetes and should be confirmed with a follow-up test. .   . BUN 04/08/2017 18  7 - 25 mg/dL Final  . Creat 04/08/2017 1.16  0.70 - 1.18 mg/dL Final   Comment: For patients >4 years of age, the reference limit for Creatinine is approximately 13% higher for people identified as African-American. .   . GFR, Est Non African American 04/08/2017 61  > OR = 60 mL/min/1.53m2 Final  . GFR, Est African American 04/08/2017 71  > OR = 60 mL/min/1.54m2 Final  . BUN/Creatinine Ratio 09/32/3557 NOT APPLICABLE  6 - 22 (calc) Final  . Sodium 04/08/2017 143  135 - 146 mmol/L Final  . Potassium 04/08/2017 4.7  3.5 - 5.3 mmol/L Final  . Chloride 04/08/2017 109  98 - 110 mmol/L Final  . CO2 04/08/2017 27  20 - 32 mmol/L Final  .  Calcium 04/08/2017 9.3  8.6 - 10.3 mg/dL Final  . Total Protein 04/08/2017 6.0* 6.1 - 8.1 g/dL Final  . Albumin 04/08/2017 3.9  3.6 - 5.1 g/dL Final  . Globulin 04/08/2017 2.1  1.9 - 3.7 g/dL (calc) Final  . AG Ratio 04/08/2017 1.9  1.0 - 2.5 (calc) Final  . Total Bilirubin 04/08/2017 1.1  0.2 - 1.2 mg/dL Final  . Alkaline phosphatase (APISO) 04/08/2017 62  40 - 115 U/L Final  . AST 04/08/2017 15  10 - 35 U/L Final  . ALT 04/08/2017 8* 9 - 46 U/L Final  . Hgb A1c MFr Bld 04/08/2017 6.0* <5.7 % of total Hgb Final   Comment: For someone without known diabetes, a hemoglobin  A1c value between 5.7% and 6.4% is consistent with prediabetes and should be confirmed with a  follow-up test. . For someone with known diabetes, a value <7% indicates that their diabetes  is well controlled. A1c targets should be individualized based on duration of diabetes, age, comorbid conditions, and other considerations. . This assay result is consistent with an increased risk of diabetes. . Currently, no consensus exists regarding use of hemoglobin A1c for diagnosis of diabetes for children. .   . Mean Plasma Glucose 04/08/2017 126  (calc) Final  . eAG (mmol/L) 04/08/2017 7.0  (calc) Final  . Cholesterol 04/08/2017 115  <200 mg/dL Final  . HDL 04/08/2017 52  >40 mg/dL Final  . Triglycerides 04/08/2017 55  <150 mg/dL Final  . LDL Cholesterol (Calc) 04/08/2017 49  mg/dL (calc) Final   Comment: Reference range: <100 . Desirable range <100 mg/dL for primary prevention;   <70 mg/dL for patients with CHD or diabetic patients  with > or = 2 CHD risk factors. Marland Kitchen LDL-C is now calculated using the Martin-Hopkins  calculation, which is a validated novel method providing  better accuracy than the Friedewald equation in the  estimation of LDL-C.  Cresenciano Genre et al. Annamaria Helling. 9518;841(66): 2061-2068  (http://education.[facility].com/faq/FAQ164)   . Total CHOL/HDL Ratio 04/08/2017 2.2  <5.0 (calc) Final  . Non-HDL Cholesterol (Calc) 04/08/2017 63  <130 mg/dL (calc) Final   Comment: For patients with diabetes plus 1 major ASCVD risk  factor, treating to a non-HDL-C goal of <100 mg/dL  (LDL-C of <70 mg/dL) is considered a therapeutic  option.   Marland Kitchen PSA 04/08/2017 1.1  < OR = 4.0 ng/mL Final   Comment: The total PSA value from this assay system is  standardized against the WHO standard. The test  result will be approximately 20% lower when compared  to the equimolar-standardized total PSA (Beckman  Coulter). Comparison of serial PSA results should be  interpreted with this fact in mind. . This test was performed using the Siemens  chemiluminescent method. Values obtained from  different assay methods cannot be used interchangeably. PSA levels, regardless of value,  should not be interpreted as absolute evidence of the presence or absence of disease.   . Creatinine, Urine 04/08/2017 87  20 - 320 mg/dL Final  . Microalb, Ur 04/08/2017 0.4  mg/dL Final   Comment: Reference Range Not established   . Microalb Creat Ratio 04/08/2017 5  <30 mcg/mg creat Final   Comment: . The ADA defines abnormalities in albumin excretion as follows: Marland Kitchen Category         Result (mcg/mg creatinine) . Normal                    <30 Microalbuminuria  30-299  Clinical albuminuria   > OR = 300 . The ADA recommends that at least two of three specimens collected within a 3-6 month period be abnormal before considering a patient to be within a diagnostic category.   Abstract on 03/23/2017  Component Date Value Ref Range Status  . HM Diabetic Eye Exam 03/08/2017 No Retinopathy  No Retinopathy Final    Past Medical History:  Diagnosis Date  . Acute gastric ulcer with hemorrhage 05/06/2013  . Asthma   . Atrial enlargement, left   . 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 Diabetes mellitus   . Diabetic nephropathy ([place])   . Diverticulosis   . ED (erectile dysfunction)   . GERD (gastroesophageal reflux disease)   . Gout   . Hearing loss   . Hiatal hernia 05/2008   EGD with HH and reflux esophagitis.   Marland Kitchen History of MI (myocardial infarction)   . History of nuclear stress test 08/07/2010   dipyridamole; EKG negative for ischemia, low risk scan   . Hyperlipidemia   . Hyperplastic colon polyp 05/2008  . Hypertension   . Ischemic cardiomyopathy    EF 45%, with inferior wall motion abnormality   . Valvular regurgitation    mitral and tricuspid (mild)   Past Surgical History:  Procedure Laterality Date  . CORONARY ARTERY BYPASS GRAFT  2000   LIMA to LAD, free IMA to OM2, sequential graft to PLA & PLD  . ESOPHAGOGASTRODUODENOSCOPY N/A 05/07/2013   Procedure: ESOPHAGOGASTRODUODENOSCOPY (EGD);  Surgeon: Gatha Mayer, MD;  Location: [facility] ENDOSCOPY;   Service: Endoscopy;  Laterality: N/A;  . TRANSTHORACIC ECHOCARDIOGRAM  09/07/2010   EF 41-93%; LV systolic function mildly reduced; LA mildly dilated; mild-mod MR & mild-mod TR; aortic root sclerosis/calcification;    Current Outpatient Medications on File Prior to Visit  Medication Sig Dispense Refill  . allopurinol (ZYLOPRIM) 300 MG tablet TAKE ONE TABLET BY MOUTH DAILY 90 tablet 1  . ALPRAZolam (XANAX) 0.5 MG tablet Take 1 tablet (0.5 mg total) by mouth at bedtime as needed for anxiety. 30 tablet 2  . aspirin 81 MG tablet Take 81 mg by mouth daily.    . benazepril (LOTENSIN) 40 MG tablet TAKE ONE TABLET BY MOUTH DAILY 90 tablet 1  . cloNIDine (CATAPRES) 0.2 MG tablet TAKE 1 TABLET (0.2 MG TOTAL) BY MOUTH 2 (TWO) TIMES DAILY. 180 tablet 1  . diclofenac sodium (VOLTAREN) 1 % GEL Apply 2 grams qid to hands, elbow and knees 400 g 5  . hydrochlorothiazide (HYDRODIURIL) 25 MG tablet TAKE 1 TABLET (25 MG TOTAL) BY MOUTH DAILY. 90 tablet 3  . metoprolol tartrate (LOPRESSOR) 50 MG tablet TAKE ONE AND ONE-HALF (1 & 1/2) TABLET BY MOUTH TWO TIMES A DAY 237 tablet 3  . pantoprazole (PROTONIX) 40 MG tablet TAKE 1 TABLET (40 MG TOTAL) BY MOUTH DAILY. 90 tablet 3  . simvastatin (ZOCOR) 40 MG tablet TAKE ONE TABLET BY MOUTH DAILY 90 tablet 1   No current facility-administered medications on file prior to visit.    No Known Allergies Social History   Socioeconomic History  . Marital status: Married    Spouse name: Not on file  . Number of children: 1  . Years of education: Not on file  . Highest education level: Not on file  Occupational History  . Not on file  Social Needs  . Financial resource strain: Not on file  . Food insecurity:    Worry: Not on file  Inability: Not on file  . Transportation needs:    Medical: Not on file    Non-medical: Not on file  Tobacco Use  . Smoking status: Former Smoker    Last attempt to quit: 11/04/1985    Years since quitting: 31.4  . Smokeless tobacco:  Never Used  Substance and Sexual Activity  . Alcohol use: Yes    Alcohol/week: 1.2 - 1.8 oz    Types: 2 - 3 Cans of beer per week  . Drug use: No  . Sexual activity: Not on file  Lifestyle  . Physical activity:    Days per week: Not on file    Minutes per session: Not on file  . Stress: Not on file  Relationships  . Social connections:    Talks on phone: Not on file    Gets together: Not on file    Attends religious service: Not on file    Active member of club or organization: Not on file    Attends meetings of clubs or organizations: Not on file    Relationship status: Not on file  . Intimate partner violence:    Fear of current or ex partner: Not on file    Emotionally abused: Not on file    Physically abused: Not on file    Forced sexual activity: Not on file  Other Topics Concern  . Not on file  Social History Narrative  . Not on file     Review of Systems  All other systems reviewed and are negative.      Objective:   Physical Exam  Constitutional: He appears well-developed and well-nourished.  Neck: Neck supple. No JVD present. No thyromegaly present.  Cardiovascular: Normal rate, regular rhythm, normal heart sounds and intact distal pulses.  No murmur heard. Pulmonary/Chest: Effort normal and breath sounds normal. No respiratory distress. He has no wheezes. He has no rales. He exhibits no tenderness.  Abdominal: Soft. Bowel sounds are normal. He exhibits no distension and no mass. There is no tenderness. There is no rebound and no guarding.  Musculoskeletal: He exhibits no edema.  Lymphadenopathy:    He has no cervical adenopathy.  Skin: No rash noted.  Vitals reviewed.         Assessment & Plan:  Type 2 diabetes mellitus without complication, without long-term current use of insulin (HCC)  Mixed hyperlipidemia  Benign essential HTN   Blood pressure today is well controlled. Therefore I'll make no changes in his antihypertensives. His LDL  cholesterol is well controlled. Therefore I will keep the patient on simvastatin. Diabetic foot exam today is normal.  The remainder of his lab work is excellent. Follow-up in 6 months or as needed. His only complaint today is a rash on both gluteal cheeks in the intertriginous zone right at the crack.  The rash is erythematous, slightly violaceous, and seems to show chronic inflammation.  We have performed a punch biopsy of this in 2014:  A. Skin-Biopsy, right gluteal cleft:    Benign skin with focal ulceration and granulation tissue.   Negative for fungal hyphae.    Negative for atypia or malignancy. We even referred him to a skin doctor who treated him with different anti-inflammatory creams without relief.  I have recommended that he try Lotrisone for possible intertrigo given the persistent inflammation.  There is no ulceration seen today or signs of malignancy.

## 2017-05-08 ENCOUNTER — Other Ambulatory Visit: Payer: Self-pay | Admitting: Family Medicine

## 2017-05-22 ENCOUNTER — Other Ambulatory Visit: Payer: Self-pay | Admitting: Family Medicine

## 2017-05-26 ENCOUNTER — Other Ambulatory Visit: Payer: Self-pay | Admitting: Family Medicine

## 2017-06-17 ENCOUNTER — Ambulatory Visit (INDEPENDENT_AMBULATORY_CARE_PROVIDER_SITE_OTHER): Payer: Medicare Other | Admitting: Podiatry

## 2017-06-17 DIAGNOSIS — E1151 Type 2 diabetes mellitus with diabetic peripheral angiopathy without gangrene: Secondary | ICD-10-CM | POA: Diagnosis not present

## 2017-06-17 DIAGNOSIS — B351 Tinea unguium: Secondary | ICD-10-CM | POA: Diagnosis not present

## 2017-06-17 NOTE — Progress Notes (Signed)
  Subjective:  Patient ID: Donald Rios, male    DOB: [birth year],  MRN: [id number]  Chief Complaint  Patient presents with  . Nail Problem    3 month debride   77 y.o. male returns for diabetic foot care. Denies new issues. Denies numbness and tingling in their feet. Denies cramping in legs and thighs.  Objective:   General AA&O x3. Normal mood and affect.  Vascular Dorsalis pedis pulses present 1+ bilaterally  Posterior tibial pulses absent bilaterally  Capillary refill normal to all digits. Pedal hair growth normal.  Neurologic Epicritic sensation present bilaterally. Protective sensation with 5.07 monofilament  present bilaterally. Vibratory sensation present bilaterally.  Dermatologic No open lesions. Interspaces clear of maceration.  Normal skin temperature and turgor. Hyperkeratotic lesions: None bilaterally. Nails: brittle, onychomycosis, thickening, elongation  Orthopedic: No history of amputation. MMT 5/5 in dorsiflexion, plantarflexion, inversion, and eversion. Normal lower extremity joint ROM without pain or crepitus.    Assessment & Plan:  Patient was evaluated and treated and all questions answered.  Diabetes with PAD, Onychomycosis -Educated on diabetic footcare. Diabetic risk level 1 -At risk foot care provided as below.  Procedure: Nail Debridement Rationale: Patient meets criteria for routine foot care due to class b findings. Type of Debridement: manual, sharp debridement. Instrumentation: Nail nipper, rotary burr. Number of Nails: 10     No follow-ups on file.

## 2017-07-08 ENCOUNTER — Other Ambulatory Visit: Payer: Self-pay | Admitting: *Deleted

## 2017-07-08 NOTE — Telephone Encounter (Signed)
Received fax requesting refill on xanax.   Ok to refill??  Last office visit 04/11/2017.  Last refill 05/26/2016, #2 refills.

## 2017-07-11 MED ORDER — ALPRAZOLAM 0.5 MG PO TABS: 0.5000 mg | ORAL_TABLET | Freq: Every evening | ORAL | 2 refills | Status: DC | PRN

## 2017-08-30 ENCOUNTER — Ambulatory Visit (INDEPENDENT_AMBULATORY_CARE_PROVIDER_SITE_OTHER): Payer: Medicare Other | Admitting: Internal Medicine

## 2017-08-30 ENCOUNTER — Encounter: Payer: Self-pay | Admitting: Internal Medicine

## 2017-08-30 VITALS — BP 134/70 | HR 52 | Ht 68.5 in | Wt 161.6 lb

## 2017-08-30 DIAGNOSIS — I255 Ischemic cardiomyopathy: Secondary | ICD-10-CM

## 2017-08-30 DIAGNOSIS — Z951 Presence of aortocoronary bypass graft: Secondary | ICD-10-CM | POA: Insufficient documentation

## 2017-08-30 DIAGNOSIS — I10 Essential (primary) hypertension: Secondary | ICD-10-CM | POA: Diagnosis not present

## 2017-08-30 DIAGNOSIS — I25812 Atherosclerosis of bypass graft of coronary artery of transplanted heart without angina pectoris: Secondary | ICD-10-CM | POA: Diagnosis not present

## 2017-08-30 DIAGNOSIS — E782 Mixed hyperlipidemia: Secondary | ICD-10-CM

## 2017-08-30 NOTE — Patient Instructions (Addendum)
Dr. Debara Pickett has ordered a Lexiscan Myocardial Perfusion Imaging Study. This is due July 2020  Please arrive 15 minutes prior to your appointment time for registration and insurance purposes.   The test will take approximately 3 to 4 hours to complete; you may bring reading material.  If someone comes with you to your appointment, they will need to remain in the main lobby due to limited space in the testing area.    How to prepare for your Myocardial Perfusion Test:  Do not eat or drink 3 hours prior to your test, except you may have water.  Do not consume products containing caffeine (regular or decaffeinated) 12 hours prior to your test. (ex: coffee, chocolate, sodas, tea).  Do wear comfortable clothes (no dresses or overalls) and walking shoes, tennis shoes preferred (No heels or open toe shoes are allowed).  Do NOT wear cologne, perfume, aftershave, or lotions (deodorant is allowed).  If you use an inhaler, use it the AM of your test and bring it with you.   If you use a nebulizer, use it the AM of your test.   If these instructions are not followed, your test will have to be rescheduled.   Your physician wants you to follow-up in: ONE YEAR with Dr. Debara Pickett. You will receive a reminder letter in the mail two months in advance. If you don't receive a letter, please call our office to schedule the follow-up appointment.
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
## 2017-09-23 ENCOUNTER — Ambulatory Visit (INDEPENDENT_AMBULATORY_CARE_PROVIDER_SITE_OTHER): Payer: Medicare Other | Admitting: Podiatry

## 2017-09-23 DIAGNOSIS — B351 Tinea unguium: Secondary | ICD-10-CM

## 2017-09-23 DIAGNOSIS — E1151 Type 2 diabetes mellitus with diabetic peripheral angiopathy without gangrene: Secondary | ICD-10-CM

## 2017-09-23 NOTE — Progress Notes (Signed)
Subjective:  Patient ID: Donald Rios, male    DOB: [birth year],  MRN: [id number]  Chief Complaint  Patient presents with  . Nail Problem    3 month nail trim    77 y.o. male presents  for diabetic foot care. Doesn't know last AMBS but last A1c 5.9. Denies numbness and tingling in their feet. Denies cramping in legs and thighs.  Review of Systems: Negative except as noted in the HPI. Denies N/V/F/Ch.  Past Medical History:  Diagnosis Date  . Acute gastric ulcer with hemorrhage 05/06/2013  . Asthma   . Atrial enlargement, left   . 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 Diabetes mellitus   . Diabetic nephropathy ([place])   . Diverticulosis   . ED (erectile dysfunction)   . GERD (gastroesophageal reflux disease)   . Gout   . Hearing loss   . Hiatal hernia 05/2008   EGD with HH and reflux esophagitis.   Marland Kitchen History of MI (myocardial infarction)   . History of nuclear stress test 08/07/2010   dipyridamole; EKG negative for ischemia, low risk scan   . Hyperlipidemia   . Hyperplastic colon polyp 05/2008  . Hypertension   . Ischemic cardiomyopathy    EF 45%, with inferior wall motion abnormality   . Valvular regurgitation    mitral and tricuspid (mild)    Current Outpatient Medications:  .  allopurinol (ZYLOPRIM) 300 MG tablet, TAKE ONE TABLET BY MOUTH DAILY, Disp: 90 tablet, Rfl: 3 .  ALPRAZolam (XANAX) 0.5 MG tablet, Take 1 tablet (0.5 mg total) by mouth at bedtime as needed for anxiety., Disp: 30 tablet, Rfl: 2 .  aspirin 81 MG tablet, Take 81 mg by mouth daily., Disp: , Rfl:  .  benazepril (LOTENSIN) 40 MG tablet, TAKE ONE TABLET BY MOUTH DAILY, Disp: 90 tablet, Rfl: 3 .  cloNIDine (CATAPRES) 0.2 MG tablet, TAKE 1 TABLET (0.2 MG TOTAL) BY MOUTH 2 (TWO) TIMES DAILY., Disp: 180 tablet, Rfl: 3 .  clotrimazole-betamethasone (LOTRISONE) cream, APPLY ONE APPLICATION TOPICALLY TWICE A DAY, Disp: 45 g, Rfl: 0 .  diclofenac sodium (VOLTAREN) 1 % GEL, Apply 2 grams qid to  hands, elbow and knees, Disp: 400 g, Rfl: 5 .  hydrochlorothiazide (HYDRODIURIL) 25 MG tablet, TAKE 1 TABLET (25 MG TOTAL) BY MOUTH DAILY., Disp: 90 tablet, Rfl: 2 .  metoprolol tartrate (LOPRESSOR) 50 MG tablet, TAKE ONE AND ONE-HALF (1 & 1/2) TABLET BY MOUTH TWO TIMES A DAY, Disp: 237 tablet, Rfl: 3 .  pantoprazole (PROTONIX) 40 MG tablet, TAKE 1 TABLET (40 MG TOTAL) BY MOUTH DAILY., Disp: 81 tablet, Rfl: 2 .  simvastatin (ZOCOR) 40 MG tablet, TAKE ONE TABLET BY MOUTH DAILY, Disp: 90 tablet, Rfl: 3  Social History   Tobacco Use  Smoking Status Former Smoker  . Last attempt to quit: 11/04/1985  . Years since quitting: 31.9  Smokeless Tobacco Never Used    No Known Allergies Objective:  There were no vitals filed for this visit. There is no height or weight on file to calculate BMI. Constitutional Well developed. Well nourished.  Vascular Dorsalis pedis pulses present 1+ bilaterally  Posterior tibial pulses absent bilaterally  Pedal hair growth absent. Capillary refill normal to all digits.  No cyanosis or clubbing noted.  Neurologic Normal speech. Oriented to person, place, and time. Epicritic sensation to light touch grossly present bilaterally. Protective sensation with 5.07 monofilament  present bilaterally. Vibratory sensation present bilaterally.  Dermatologic Nails elongated, thickened, dystrophic. No open  wounds. No skin lesions.  Orthopedic: Normal joint ROM without pain or crepitus bilaterally. No visible deformities. No bony tenderness.   Assessment:   1. Diabetes mellitus type 2 with peripheral artery disease ([place])   2. Onychomycosis    Plan:  Patient was evaluated and treated and all questions answered.  Diabetes with PAD, Onychomycosis -Educated on diabetic footcare. Diabetic risk level 1 -Nails x10 debrided sharply and manually with large nail nipper and rotary burr.   Procedure: Nail Debridement Rationale: Patient meets criteria for routine foot care  due to PAD Type of Debridement: manual, sharp debridement. Instrumentation: Nail nipper, rotary burr. Number of Nails: 10  Return in about 3 months (around 12/23/2017) for Diabetic Foot Care.

## 2017-10-12 ENCOUNTER — Other Ambulatory Visit: Payer: Medicare Other

## 2017-10-12 DIAGNOSIS — I10 Essential (primary) hypertension: Secondary | ICD-10-CM

## 2017-10-12 DIAGNOSIS — E782 Mixed hyperlipidemia: Secondary | ICD-10-CM

## 2017-10-12 DIAGNOSIS — E119 Type 2 diabetes mellitus without complications: Secondary | ICD-10-CM | POA: Diagnosis not present

## 2017-10-13 LAB — LIPID PANEL
CHOL/HDL RATIO: 2.1 (calc) (ref <5.0)
Cholesterol: 112 mg/dL (ref <200)
HDL: 54 mg/dL (ref >40)
LDL CHOLESTEROL (CALC): 45 mg/dL
NON-HDL CHOLESTEROL (CALC): 58 mg/dL (ref <130)
TRIGLYCERIDES: 52 mg/dL (ref <150)

## 2017-10-13 LAB — CBC WITH DIFFERENTIAL/PLATELET
BASOS PCT: 0.3 %
Basophils Absolute: 29 cells/uL (ref 0–200)
Eosinophils Absolute: 107 cells/uL (ref 15–500)
Eosinophils Relative: 1.1 %
HCT: 40.2 % (ref 38.5–50.0)
HEMOGLOBIN: 13.4 g/dL (ref 13.2–17.1)
Lymphs Abs: 1610 cells/uL (ref 850–3900)
MCH: 30.7 pg (ref 27.0–33.0)
MCHC: 33.3 g/dL (ref 32.0–36.0)
MCV: 92.2 fL (ref 80.0–100.0)
MPV: 12.5 fL (ref 7.5–12.5)
Monocytes Relative: 7.7 %
NEUTROS ABS: 7207 {cells}/uL (ref 1500–7800)
Neutrophils Relative %: 74.3 %
PLATELETS: 186 10*3/uL (ref 140–400)
RBC: 4.36 10*6/uL (ref 4.20–5.80)
RDW: 12.7 % (ref 11.0–15.0)
TOTAL LYMPHOCYTE: 16.6 %
WBC: 9.7 10*3/uL (ref 3.8–10.8)
WBCMIX: 747 {cells}/uL (ref 200–950)

## 2017-10-13 LAB — COMPREHENSIVE METABOLIC PANEL
AG Ratio: 2.4 (calc) (ref 1.0–2.5)
ALKALINE PHOSPHATASE (APISO): 64 U/L (ref 40–115)
ALT: 9 U/L (ref 9–46)
AST: 13 U/L (ref 10–35)
Albumin: 4.3 g/dL (ref 3.6–5.1)
BUN/Creatinine Ratio: 19 (calc) (ref 6–22)
BUN: 23 mg/dL (ref 7–25)
CHLORIDE: 105 mmol/L (ref 98–110)
CO2: 25 mmol/L (ref 20–32)
Calcium: 9.7 mg/dL (ref 8.6–10.3)
Creat: 1.19 mg/dL — ABNORMAL HIGH (ref 0.70–1.18)
GLOBULIN: 1.8 g/dL — AB (ref 1.9–3.7)
GLUCOSE: 132 mg/dL — AB (ref 65–99)
Potassium: 4.8 mmol/L (ref 3.5–5.3)
Sodium: 139 mmol/L (ref 135–146)
Total Bilirubin: 1.6 mg/dL — ABNORMAL HIGH (ref 0.2–1.2)
Total Protein: 6.1 g/dL (ref 6.1–8.1)

## 2017-10-13 LAB — HEMOGLOBIN A1C
HEMOGLOBIN A1C: 6 %{Hb} — AB (ref <5.7)
MEAN PLASMA GLUCOSE: 126 (calc)
eAG (mmol/L): 7 (calc)

## 2017-10-17 ENCOUNTER — Ambulatory Visit (INDEPENDENT_AMBULATORY_CARE_PROVIDER_SITE_OTHER): Payer: Medicare Other | Admitting: Family Medicine

## 2017-10-17 ENCOUNTER — Encounter: Payer: Self-pay | Admitting: Family Medicine

## 2017-10-17 VITALS — BP 100/50 | HR 60 | Temp 97.8°F | Resp 14 | Ht 68.5 in | Wt 152.0 lb

## 2017-10-17 DIAGNOSIS — E782 Mixed hyperlipidemia: Secondary | ICD-10-CM

## 2017-10-17 DIAGNOSIS — I255 Ischemic cardiomyopathy: Secondary | ICD-10-CM

## 2017-10-17 DIAGNOSIS — R17 Unspecified jaundice: Secondary | ICD-10-CM

## 2017-10-17 DIAGNOSIS — Z23 Encounter for immunization: Secondary | ICD-10-CM | POA: Diagnosis not present

## 2017-10-17 DIAGNOSIS — I5022 Chronic systolic (congestive) heart failure: Secondary | ICD-10-CM | POA: Diagnosis not present

## 2017-10-17 DIAGNOSIS — E119 Type 2 diabetes mellitus without complications: Secondary | ICD-10-CM | POA: Diagnosis not present

## 2017-10-17 DIAGNOSIS — L309 Dermatitis, unspecified: Secondary | ICD-10-CM | POA: Diagnosis not present

## 2017-10-17 DIAGNOSIS — Z8679 Personal history of other diseases of the circulatory system: Secondary | ICD-10-CM | POA: Diagnosis not present

## 2017-10-17 DIAGNOSIS — I10 Essential (primary) hypertension: Secondary | ICD-10-CM | POA: Diagnosis not present

## 2017-10-17 NOTE — Progress Notes (Signed)
Subjective:    Patient ID: Donald Rios, male    DOB: Jan 05, 1941, 77 y.o.   MRN: [id number]  Medication Refill    04/2017 Here today for recheck of his medical problems. His blood pressure today is well controlled at 120/58.  He denies any chest pain shortness of breath or dyspnea on exertion. In his record as documented history of congestive heart failure. Previous ejection fraction is documented 45%. However the patient had a Myoview performed in 2012 that showed an ejection fraction about 50%. He is completely asymptomatic. He denies any orthopnea or paroxysmal nocturnal dyspnea. He has no limitation as far as exercise capacity. His most recent hemoglobin A1c was 6.0. He denies any neuropathy in his feet. He denies any numbness or tingling. He is now seeing a podiatrist.  He denies any polyuria, polydipsia, or blurry vision.  He also has a history of hyperlipidemia. He is currently on simvastatin. He denies any significant myalgias. His most recent lab work shows an LDL cholesterol of 45 which is well below his goal of 70.  At that time, my plan was: Blood pressure today is well controlled. Therefore I'll make no changes in his antihypertensives. His LDL cholesterol is well controlled. Therefore I will keep the patient on simvastatin. Diabetic foot exam today is normal.  The remainder of his lab work is excellent. Follow-up in 6 months or as needed. His only complaint today is a rash on both gluteal cheeks in the intertriginous zone right at the crack.  The rash is erythematous, slightly violaceous, and seems to show chronic inflammation.  We have performed a punch biopsy of this in 2014:  A. Skin-Biopsy, right gluteal cleft:    Benign skin with focal ulceration and granulation tissue.   Negative for fungal hyphae.    Negative for atypia or malignancy. We even referred him to a skin doctor who treated him with different anti-inflammatory creams without relief.  I have recommended that  he try Lotrisone for possible intertrigo given the persistent inflammation.  There is no ulceration seen today or signs of malignancy.  10/17/17 Patient is due for a flu shot.  He continues to battle the rash in his intergluteal cleft and onto his buttocks bilaterally.  Today on exam there is a butterfly pattern patch of skin that is erythematous to violaceous in color.  The skin there is indurated and firm suggesting chronic inflammation.  There is no plaque.  There is superficial ulceration in the skin now approximate 1 cm diameter on each side.  He has been applying Lotrisone and there daily since I last saw him.  He states that it hurts.  Otherwise he is here today for his regular follow-up.  Blood pressure is well controlled at 100/50.  He states that is usually a little bit higher than this.  He denies any chest pain shortness of breath or dyspnea on exertion.  He denies any myalgias or right upper quadrant pain.  He denies any polyuria, polydipsia, blurry vision.  He would like to receive his flu shot today.  His bilirubin was slightly elevated on his lab work.  Previously was 1.1.  Today is 1.6.  He denies any gastrointestinal problems.  He still has his gallbladder.  He denies any abdominal discomfort Most recent labs are listed below: Lab on 10/12/2017  Component Date Value Ref Range Status  . Cholesterol 10/12/2017 112  <200 mg/dL Final  . HDL 10/12/2017 54  >40 mg/dL Final  . Triglycerides 10/12/2017  52  <150 mg/dL Final  . LDL Cholesterol (Calc) 10/12/2017 45  mg/dL (calc) Final   Comment: Reference range: <100 . Desirable range <100 mg/dL for primary prevention;   <70 mg/dL for patients with CHD or diabetic patients  with > or = 2 CHD risk factors. Marland Kitchen LDL-C is now calculated using the Martin-Hopkins  calculation, which is a validated novel method providing  better accuracy than the Friedewald equation in the  estimation of LDL-C.  Cresenciano Genre et al. Annamaria Helling. 6144;315(40): 2061-2068    (http://education.[facility].com/faq/FAQ164)   . Total CHOL/HDL Ratio 10/12/2017 2.1  <5.0 (calc) Final  . Non-HDL Cholesterol (Calc) 10/12/2017 58  <130 mg/dL (calc) Final   Comment: For patients with diabetes plus 1 major ASCVD risk  factor, treating to a non-HDL-C goal of <100 mg/dL  (LDL-C of <70 mg/dL) is considered a therapeutic  option.   . Hgb A1c MFr Bld 10/12/2017 6.0* <5.7 % of total Hgb Final   Comment: For someone without known diabetes, a hemoglobin  A1c value between 5.7% and 6.4% is consistent with prediabetes and should be confirmed with a  follow-up test. . For someone with known diabetes, a value <7% indicates that their diabetes is well controlled. A1c targets should be individualized based on duration of diabetes, age, comorbid conditions, and other considerations. . This assay result is consistent with an increased risk of diabetes. . Currently, no consensus exists regarding use of hemoglobin A1c for diagnosis of diabetes for children. .   . Mean Plasma Glucose 10/12/2017 126  (calc) Final  . eAG (mmol/L) 10/12/2017 7.0  (calc) Final  . Glucose, Bld 10/12/2017 132* 65 - 99 mg/dL Final   Comment: .            Fasting reference interval . For someone without known diabetes, a glucose value >125 mg/dL indicates that they may have diabetes and this should be confirmed with a follow-up test. .   . BUN 10/12/2017 23  7 - 25 mg/dL Final  . Creat 10/12/2017 1.19* 0.70 - 1.18 mg/dL Final   Comment: For patients >13 years of age, the reference limit for Creatinine is approximately 13% higher for people identified as African-American. .   Havery Moros Ratio 10/12/2017 19  6 - 22 (calc) Final  . Sodium 10/12/2017 139  135 - 146 mmol/L Final  . Potassium 10/12/2017 4.8  3.5 - 5.3 mmol/L Final  . Chloride 10/12/2017 105  98 - 110 mmol/L Final  . CO2 10/12/2017 25  20 - 32 mmol/L Final  . Calcium 10/12/2017 9.7  8.6 - 10.3 mg/dL Final  . Total  Protein 10/12/2017 6.1  6.1 - 8.1 g/dL Final  . Albumin 10/12/2017 4.3  3.6 - 5.1 g/dL Final  . Globulin 10/12/2017 1.8* 1.9 - 3.7 g/dL (calc) Final  . AG Ratio 10/12/2017 2.4  1.0 - 2.5 (calc) Final  . Total Bilirubin 10/12/2017 1.6* 0.2 - 1.2 mg/dL Final  . Alkaline phosphatase (APISO) 10/12/2017 64  40 - 115 U/L Final  . AST 10/12/2017 13  10 - 35 U/L Final  . ALT 10/12/2017 9  9 - 46 U/L Final  . WBC 10/12/2017 9.7  3.8 - 10.8 Thousand/uL Final  . RBC 10/12/2017 4.36  4.20 - 5.80 Million/uL Final  . Hemoglobin 10/12/2017 13.4  13.2 - 17.1 g/dL Final  . HCT 10/12/2017 40.2  38.5 - 50.0 % Final  . MCV 10/12/2017 92.2  80.0 - 100.0 fL Final  . MCH 10/12/2017 30.7  27.0 -  33.0 pg Final  . MCHC 10/12/2017 33.3  32.0 - 36.0 g/dL Final  . RDW 10/12/2017 12.7  11.0 - 15.0 % Final  . Platelets 10/12/2017 186  140 - 400 Thousand/uL Final  . MPV 10/12/2017 12.5  7.5 - 12.5 fL Final  . Neutro Abs 10/12/2017 7,207  1,500 - 7,800 cells/uL Final  . Lymphs Abs 10/12/2017 1,610  850 - 3,900 cells/uL Final  . WBC mixed population 10/12/2017 747  200 - 950 cells/uL Final  . Eosinophils Absolute 10/12/2017 107  15 - 500 cells/uL Final  . Basophils Absolute 10/12/2017 29  0 - 200 cells/uL Final  . Neutrophils Relative % 10/12/2017 74.3  % Final  . Total Lymphocyte 10/12/2017 16.6  % Final  . Monocytes Relative 10/12/2017 7.7  % Final  . Eosinophils Relative 10/12/2017 1.1  % Final  . Basophils Relative 10/12/2017 0.3  % Final    Past Medical History:  Diagnosis Date  . Acute gastric ulcer with hemorrhage 05/06/2013  . Asthma   . Atrial enlargement, left   . 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 Diabetes mellitus   . Diabetic nephropathy ([place])   . Diverticulosis   . ED (erectile dysfunction)   . GERD (gastroesophageal reflux disease)   . Gout   . Hearing loss   . Hiatal hernia 05/2008   EGD with HH and reflux esophagitis.   Marland Kitchen History of MI (myocardial infarction)   . History of  nuclear stress test 08/07/2010   dipyridamole; EKG negative for ischemia, low risk scan   . Hyperlipidemia   . Hyperplastic colon polyp 05/2008  . Hypertension   . Ischemic cardiomyopathy    EF 45%, with inferior wall motion abnormality   . Valvular regurgitation    mitral and tricuspid (mild)   Past Surgical History:  Procedure Laterality Date  . CORONARY ARTERY BYPASS GRAFT  2000   LIMA to LAD, free IMA to OM2, sequential graft to PLA & PLD  . ESOPHAGOGASTRODUODENOSCOPY N/A 05/07/2013   Procedure: ESOPHAGOGASTRODUODENOSCOPY (EGD);  Surgeon: Gatha Mayer, MD;  Location: [facility] ENDOSCOPY;  Service: Endoscopy;  Laterality: N/A;  . TRANSTHORACIC ECHOCARDIOGRAM  09/07/2010   EF 62-69%; LV systolic function mildly reduced; LA mildly dilated; mild-mod MR & mild-mod TR; aortic root sclerosis/calcification;    Current Outpatient Medications on File Prior to Visit  Medication Sig Dispense Refill  . allopurinol (ZYLOPRIM) 300 MG tablet TAKE ONE TABLET BY MOUTH DAILY 90 tablet 3  . ALPRAZolam (XANAX) 0.5 MG tablet Take 1 tablet (0.5 mg total) by mouth at bedtime as needed for anxiety. 30 tablet 2  . aspirin 81 MG tablet Take 81 mg by mouth daily.    . benazepril (LOTENSIN) 40 MG tablet TAKE ONE TABLET BY MOUTH DAILY 90 tablet 3  . cloNIDine (CATAPRES) 0.2 MG tablet TAKE 1 TABLET (0.2 MG TOTAL) BY MOUTH 2 (TWO) TIMES DAILY. 180 tablet 3  . clotrimazole-betamethasone (LOTRISONE) cream APPLY ONE APPLICATION TOPICALLY TWICE A DAY 45 g 0  . diclofenac sodium (VOLTAREN) 1 % GEL Apply 2 grams qid to hands, elbow and knees 400 g 5  . hydrochlorothiazide (HYDRODIURIL) 25 MG tablet TAKE 1 TABLET (25 MG TOTAL) BY MOUTH DAILY. 90 tablet 2  . metoprolol tartrate (LOPRESSOR) 50 MG tablet TAKE ONE AND ONE-HALF (1 & 1/2) TABLET BY MOUTH TWO TIMES A DAY 237 tablet 3  . pantoprazole (PROTONIX) 40 MG tablet TAKE 1 TABLET (40 MG TOTAL) BY MOUTH DAILY. 81 tablet 2  .  simvastatin (ZOCOR) 40 MG tablet TAKE ONE TABLET BY  MOUTH DAILY 90 tablet 3   No current facility-administered medications on file prior to visit.    No Known Allergies Social History   Socioeconomic History  . Marital status: Married    Spouse name: Not on file  . Number of children: 1  . Years of education: Not on file  . Highest education level: Not on file  Occupational History  . Not on file  Social Needs  . Financial resource strain: Not on file  . Food insecurity:    Worry: Not on file    Inability: Not on file  . Transportation needs:    Medical: Not on file    Non-medical: Not on file  Tobacco Use  . Smoking status: Former Smoker    Last attempt to quit: 11/04/1985    Years since quitting: 31.9  . Smokeless tobacco: Never Used  Substance and Sexual Activity  . Alcohol use: Yes    Alcohol/week: 2.0 - 3.0 standard drinks    Types: 2 - 3 Cans of beer per week  . Drug use: No  . Sexual activity: Not on file  Lifestyle  . Physical activity:    Days per week: Not on file    Minutes per session: Not on file  . Stress: Not on file  Relationships  . Social connections:    Talks on phone: Not on file    Gets together: Not on file    Attends religious service: Not on file    Active member of club or organization: Not on file    Attends meetings of clubs or organizations: Not on file    Relationship status: Not on file  . Intimate partner violence:    Fear of current or ex partner: Not on file    Emotionally abused: Not on file    Physically abused: Not on file    Forced sexual activity: Not on file  Other Topics Concern  . Not on file  Social History Narrative  . Not on file     Review of Systems  All other systems reviewed and are negative.      Objective:   Physical Exam  Constitutional: He appears well-developed and well-nourished.  Neck: Neck supple. No JVD present. No thyromegaly present.  Cardiovascular: Normal rate, regular rhythm, normal heart sounds and intact distal pulses.  No murmur  heard. Pulmonary/Chest: Effort normal and breath sounds normal. No respiratory distress. He has no wheezes. He has no rales. He exhibits no tenderness.  Abdominal: Soft. Bowel sounds are normal. He exhibits no distension and no mass. There is no tenderness. There is no rebound and no guarding.  Musculoskeletal: He exhibits no edema.       Legs: Lymphadenopathy:    He has no cervical adenopathy.  Skin: Rash noted. Rash is macular. There is erythema.  Vitals reviewed.  Faint right carotid bruit.  This is chronic.  Carotid Dopplers in 2017 revealed 1 to 39% ICA stenosis bilaterally.       Assessment & Plan:  Dermatitis - Plan: Ambulatory referral to Dermatology  Elevated bilirubin - Plan: Bilirubin, fractionated(tot/dir/indir)  Type 2 diabetes mellitus without complication, without long-term current use of insulin (HCC)  Mixed hyperlipidemia  Benign essential HTN  Chronic systolic congestive heart failure, NYHA class 1 ([place])  History of ASCVD  I am very happy with his lab work.  Diabetes remains well controlled.  He denies any chest pain shortness of breath  or dyspnea on exertion.  Blood pressure is adequately controlled.  He denies any symptoms of uncontrolled congestive heart failure.  I believe his elevated bilirubin is a fluke.  I have recommended rechecking his bilirubin level in 2 weeks but get a fractionated bilirubin.  If elevation persists, I will proceed with a right upper quadrant ultrasound.  I am concerned by the persistent rash on his gluteus and in the intergluteal cleft.  I recommended discontinuation of Lotrisone as I believe the corticosteroid cream may be causing thinning of the skin leading to the superficial ulceration represented by the black dots on the diagram.  The rash appears to show chronic inflammation.  I recommended a second referral to dermatology.  For comfort he can use a and D ointment daily as a barrier cream.  Cholesterol is excellent.  He received his  flu shot today

## 2017-11-02 DIAGNOSIS — L8990 Pressure ulcer of unspecified site, unspecified stage: Secondary | ICD-10-CM | POA: Diagnosis not present

## 2017-11-08 ENCOUNTER — Other Ambulatory Visit: Payer: Medicare Other

## 2017-11-08 DIAGNOSIS — R17 Unspecified jaundice: Secondary | ICD-10-CM | POA: Diagnosis not present

## 2017-11-09 LAB — BILIRUBIN, FRACTIONATED(TOT/DIR/INDIR)
BILIRUBIN DIRECT: 0.3 mg/dL — AB (ref 0.0–0.2)
Indirect Bilirubin: 0.7 mg/dL (calc) (ref 0.2–1.2)
Total Bilirubin: 1 mg/dL (ref 0.2–1.2)

## 2017-11-18 ENCOUNTER — Other Ambulatory Visit: Payer: Self-pay | Admitting: Family Medicine

## 2017-12-23 ENCOUNTER — Ambulatory Visit: Payer: Medicare Other | Admitting: Podiatry

## 2017-12-23 ENCOUNTER — Encounter: Payer: Self-pay | Admitting: Podiatry

## 2017-12-23 ENCOUNTER — Ambulatory Visit (INDEPENDENT_AMBULATORY_CARE_PROVIDER_SITE_OTHER): Payer: Medicare Other | Admitting: Podiatry

## 2017-12-23 DIAGNOSIS — E1151 Type 2 diabetes mellitus with diabetic peripheral angiopathy without gangrene: Secondary | ICD-10-CM

## 2017-12-23 DIAGNOSIS — M79674 Pain in right toe(s): Secondary | ICD-10-CM

## 2017-12-23 DIAGNOSIS — M79675 Pain in left toe(s): Secondary | ICD-10-CM

## 2017-12-23 DIAGNOSIS — B351 Tinea unguium: Secondary | ICD-10-CM

## 2017-12-23 NOTE — Progress Notes (Addendum)
Subjective: Donald Rios presents today with history of diabetes with cc of painful, mycotic toenails.  Pain is aggravated when wearing enclosed shoe gear and relieved with periodic professional debridement.  Patient voices no new problems on today's visit.  Cletus Gash T. Dennard Schaumann is his PCP and last date seen 10/17/2017.  Current Outpatient Medications:  .  allopurinol (ZYLOPRIM) 300 MG tablet, TAKE ONE TABLET BY MOUTH DAILY, Disp: 90 tablet, Rfl: 3 .  ALPRAZolam (XANAX) 0.5 MG tablet, Take 1 tablet (0.5 mg total) by mouth at bedtime as needed for anxiety., Disp: 30 tablet, Rfl: 2 .  aspirin 81 MG tablet, Take 81 mg by mouth daily., Disp: , Rfl:  .  benazepril (LOTENSIN) 40 MG tablet, TAKE ONE TABLET BY MOUTH DAILY, Disp: 90 tablet, Rfl: 3 .  cloNIDine (CATAPRES) 0.2 MG tablet, TAKE 1 TABLET (0.2 MG TOTAL) BY MOUTH 2 (TWO) TIMES DAILY., Disp: 180 tablet, Rfl: 3 .  clotrimazole-betamethasone (LOTRISONE) cream, APPLY ONE APPLICATION TOPICALLY TWICE A DAY, Disp: 45 g, Rfl: 0 .  diclofenac sodium (VOLTAREN) 1 % GEL, Apply 2 grams qid to hands, elbow and knees, Disp: 400 g, Rfl: 5 .  hydrochlorothiazide (HYDRODIURIL) 25 MG tablet, TAKE 1 TABLET (25 MG TOTAL) BY MOUTH DAILY., Disp: 90 tablet, Rfl: 2 .  metoprolol tartrate (LOPRESSOR) 50 MG tablet, TAKE ONE AND ONE-HALF (1 & 1/2) TABLET BY MOUTH TWO TIMES A DAY, Disp: 270 tablet, Rfl: 2 .  pantoprazole (PROTONIX) 40 MG tablet, TAKE ONE TABLET BY MOUTH DAILY, Disp: 90 tablet, Rfl: 1 .  simvastatin (ZOCOR) 40 MG tablet, TAKE ONE TABLET BY MOUTH DAILY, Disp: 90 tablet, Rfl: 3  No Known Allergies  Objective:  Vascular Examination: Capillary refill time immediate x 10 digits Dorsalis pedis 1/4 b/l Posterior tibial pulses absent b/l Digital hair x 10 digits was absent Skin temperature gradient WNL b/l  Dermatological Examination: Skin with normal turgor, texture and tone b/l  Toenails 1-5 b/l discolored, thick, dystrophic with subungual debris and  pain with palpation to nailbeds due to thickness of nails.  No open wounds b/l.   No interdigital macerations b/l  Musculoskeletal: Muscle strength 5/5 to all muscle groups b/l  Neurological: Sensation with 10 gram monofilament is absent b/l Vibratory sensation absent b/l  Assessment: 1. Painful onychomycosis toenails 1-5 b/l 2. NIDDM with PAD  Plan: 1. Toenails 1-5 b/l were debrided in length and girth without iatrogenic bleeding. 2. Patient to continue soft, supportive shoe gear 3. Patient to report any pedal injuries to medical professional  4. Follow up 3 months. Patient/POA to call should there be a concern in the interim.

## 2017-12-23 NOTE — Patient Instructions (Signed)

## 2018-01-20 ENCOUNTER — Encounter: Payer: Self-pay | Admitting: Family Medicine

## 2018-01-20 ENCOUNTER — Ambulatory Visit (INDEPENDENT_AMBULATORY_CARE_PROVIDER_SITE_OTHER): Payer: Medicare Other | Admitting: Family Medicine

## 2018-01-20 VITALS — BP 120/72 | HR 56 | Temp 97.6°F | Resp 15 | Ht 68.5 in | Wt 159.2 lb

## 2018-01-20 DIAGNOSIS — J209 Acute bronchitis, unspecified: Secondary | ICD-10-CM | POA: Diagnosis not present

## 2018-01-20 DIAGNOSIS — J329 Chronic sinusitis, unspecified: Secondary | ICD-10-CM

## 2018-01-20 MED ORDER — BENZONATATE 100 MG PO CAPS: 100.0000 mg | ORAL_CAPSULE | Freq: Three times a day (TID) | ORAL | 0 refills | Status: DC | PRN

## 2018-01-20 MED ORDER — IPRATROPIUM-ALBUTEROL 0.5-2.5 (3) MG/3ML IN SOLN
3.0000 mL | Freq: Once | RESPIRATORY_TRACT | Status: AC
  Administered 2018-01-20: 3 mL via RESPIRATORY_TRACT

## 2018-01-20 MED ORDER — ALBUTEROL SULFATE HFA 108 (90 BASE) MCG/ACT IN AERS: 2.0000 | INHALATION_SPRAY | RESPIRATORY_TRACT | 0 refills | Status: DC | PRN

## 2018-01-20 MED ORDER — AZITHROMYCIN 250 MG PO TABS: ORAL_TABLET | ORAL | 0 refills | Status: DC

## 2018-01-20 MED ORDER — PREDNISONE 20 MG PO TABS: 40.0000 mg | ORAL_TABLET | Freq: Every day | ORAL | 0 refills | Status: AC

## 2018-01-20 NOTE — Progress Notes (Signed)
Patient ID: Donald Rios, male    DOB: May 03, 1940, 78 y.o.   MRN: [id number]  PCP: Susy Frizzle, MD  Chief Complaint  Patient presents with  . Sinusitis    Patient in with c/o sinus pressure. Onset a few weeks ago    Subjective:   Donald Rios is a 78 y.o. male, presents to clinic with CC of sinus congestion and chest congestion with productive cough, onset 1.5 weeks ago.  Sinus pain and pressure is worsening, more severe, associated with congestion, postnasal drip, scratchy throat.  He has associated worsening cough that is intermittently productive, sometimes associated with some chest tightness and wheeze.  He has taken over-the-counter cough syrups without any improvement.  He denies any fever, sweats, chills, chest pain, nausea, vomiting, diarrhea, rash  Have history of heart disease, CABG, ischemic cardiomyopathy, he also reports history of lung disease  Ports history of chronic sinusitis and he believes that since he was outside working with a leaves and doing yard work that the weather changes and exposure to the plants irritated his sinuses and that is why he has been worse for the past couple weeks  He denies any recent chest pain or pressure associated with his illness, denies any near syncope, weakness, palpitations, orthopnea, PND, lower extremity edema.  Patient Active Problem List   Diagnosis Date Noted  . Hx of CABG 08/30/2017  . 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 Cardiomyopathy, ischemic 12/23/2015  . Chronic systolic congestive heart failure, NYHA class 1 ([place]) 12/23/2015  . Acute gastric ulcer with hemorrhage 05/06/2013  . 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 Mild tricuspid regurgitation   . Mitral regurgitation   . Atrial enlargement, left   . Hearing loss   . Diverticulosis   . Hiatal hernia   . Gout   . DM2 (diabetes mellitus, type 2) ([place])   . GERD  (gastroesophageal reflux disease)   . Mixed hyperlipidemia   . Essential hypertension      Prior to Admission medications   Medication Sig Start Date End Date Taking? Authorizing Provider  allopurinol (ZYLOPRIM) 300 MG tablet TAKE ONE TABLET BY MOUTH DAILY 05/23/17  Yes Susy Frizzle, MD  ALPRAZolam Duanne Moron) 0.5 MG tablet Take 1 tablet (0.5 mg total) by mouth at bedtime as needed for anxiety. 07/11/17  Yes Susy Frizzle, MD  aspirin 81 MG tablet Take 81 mg by mouth daily.   Yes [provider]  benazepril (LOTENSIN) 40 MG tablet TAKE ONE TABLET BY MOUTH DAILY 05/23/17  Yes Susy Frizzle, MD  cloNIDine (CATAPRES) 0.2 MG tablet TAKE 1 TABLET (0.2 MG TOTAL) BY MOUTH 2 (TWO) TIMES DAILY. 05/23/17  Yes Susy Frizzle, MD  clotrimazole-betamethasone (LOTRISONE) cream APPLY ONE APPLICATION TOPICALLY TWICE A DAY 05/26/17  Yes Susy Frizzle, MD  diclofenac sodium (VOLTAREN) 1 % GEL Apply 2 grams qid to hands, elbow and knees 01/07/17  Yes Susy Frizzle, MD  hydrochlorothiazide (HYDRODIURIL) 25 MG tablet TAKE 1 TABLET (25 MG TOTAL) BY MOUTH DAILY. 05/09/17  Yes Susy Frizzle, MD  metoprolol tartrate (LOPRESSOR) 50 MG tablet TAKE ONE AND ONE-HALF (1 & 1/2) TABLET BY MOUTH TWO TIMES A DAY 11/18/17  Yes Susy Frizzle, MD  pantoprazole (PROTONIX) 40 MG tablet TAKE ONE TABLET BY MOUTH DAILY 11/18/17  Yes Susy Frizzle, MD  simvastatin (ZOCOR) 40 MG tablet TAKE ONE TABLET BY MOUTH  DAILY 05/23/17  Yes Susy Frizzle, MD     No Known Allergies   Family History  Problem Relation Age of Onset  . Stroke Father   . Hypertension Father   . Stroke Maternal Grandmother   . Stroke Maternal Grandfather   . Diabetes Brother   . Esophageal cancer Brother   . Stomach cancer Maternal Aunt      Social History   Socioeconomic History  . Marital status: Married    Spouse name: Not on file  . Number of children: 1  . Years of education: Not on file  . Highest education  level: Not on file  Occupational History  . Not on file  Social Needs  . Financial resource strain: Not on file  . Food insecurity:    Worry: Not on file    Inability: Not on file  . Transportation needs:    Medical: Not on file    Non-medical: Not on file  Tobacco Use  . Smoking status: Former Smoker    Last attempt to quit: 11/04/1985    Years since quitting: 32.2  . Smokeless tobacco: Never Used  Substance and Sexual Activity  . Alcohol use: Yes    Alcohol/week: 2.0 - 3.0 standard drinks    Types: 2 - 3 Cans of beer per week  . Drug use: No  . Sexual activity: Not on file  Lifestyle  . Physical activity:    Days per week: Not on file    Minutes per session: Not on file  . Stress: Not on file  Relationships  . Social connections:    Talks on phone: Not on file    Gets together: Not on file    Attends religious service: Not on file    Active member of club or organization: Not on file    Attends meetings of clubs or organizations: Not on file    Relationship status: Not on file  . Intimate partner violence:    Fear of current or ex partner: Not on file    Emotionally abused: Not on file    Physically abused: Not on file    Forced sexual activity: Not on file  Other Topics Concern  . Not on file  Social History Narrative  . Not on file     Review of Systems  Constitutional: Negative.   HENT: Negative.   Eyes: Negative.   Respiratory: Negative.   Cardiovascular: Negative.   Gastrointestinal: Negative.   Endocrine: Negative.   Genitourinary: Negative.   Musculoskeletal: Negative.   Skin: Negative.   Allergic/Immunologic: Negative.   Neurological: Negative.   Hematological: Negative.   Psychiatric/Behavioral: Negative.   All other systems reviewed and are negative.      Objective:    Vitals:   01/20/18 1208  BP: 120/72  Pulse: (!) 56  Resp: 15  Temp: 97.6 F (36.4 C)  TempSrc: Oral  SpO2: 99%  Weight: 159 lb 4 oz (72.2 kg)  Height: 5' 8.5"  (1.74 m)      Physical Exam Vitals signs and nursing note reviewed.  Constitutional:      General: He is not in acute distress.    Appearance: Normal appearance. He is well-developed. He is not toxic-appearing or diaphoretic.  HENT:     Head: Normocephalic and atraumatic.     Jaw: No trismus.     Right Ear: Tympanic membrane, ear canal and external ear normal.     Left Ear: Tympanic membrane, ear canal and external  ear normal.     Nose: Mucosal edema, congestion and rhinorrhea present.     Right Sinus: No maxillary sinus tenderness or frontal sinus tenderness.     Left Sinus: No maxillary sinus tenderness or frontal sinus tenderness.     Mouth/Throat:     Mouth: Mucous membranes are moist. Mucous membranes are not pale, not dry and not cyanotic.     Pharynx: Uvula midline. No oropharyngeal exudate, posterior oropharyngeal erythema or uvula swelling.     Tonsils: No tonsillar exudate or tonsillar abscesses.  Eyes:     General: Lids are normal.        Right eye: No discharge.        Left eye: No discharge.     Conjunctiva/sclera: Conjunctivae normal.     Pupils: Pupils are equal, round, and reactive to light.  Neck:     Musculoskeletal: Normal range of motion and neck supple.     Trachea: Trachea and phonation normal. No tracheal deviation.  Cardiovascular:     Rate and Rhythm: Normal rate and regular rhythm.     Pulses: Normal pulses.          Radial pulses are 2+ on the right side and 2+ on the left side.     Heart sounds: Normal heart sounds. No murmur. No friction rub. No gallop.   Pulmonary:     Effort: Pulmonary effort is normal. No tachypnea, accessory muscle usage or respiratory distress.     Breath sounds: No stridor. Wheezing and rhonchi present. No decreased breath sounds or rales.  Abdominal:     General: Bowel sounds are normal. There is no distension.     Palpations: Abdomen is soft.     Tenderness: There is no abdominal tenderness.  Musculoskeletal: Normal range  of motion.     Right lower leg: No edema.     Left lower leg: No edema.  Skin:    General: Skin is warm and dry.     Capillary Refill: Capillary refill takes less than 2 seconds.     Coloration: Skin is not pale.     Findings: No rash.     Nails: There is no clubbing.   Neurological:     Mental Status: He is alert and oriented to person, place, and time.     Motor: No abnormal muscle tone.     Coordination: Coordination normal.     Gait: Gait normal.  Psychiatric:        Speech: Speech normal.        Behavior: Behavior normal. Behavior is cooperative.           Assessment & Plan:      ICD-10-CM   1. Acute bronchitis, unspecified organism J20.9 predniSONE (DELTASONE) 20 MG tablet    albuterol (PROVENTIL HFA;VENTOLIN HFA) 108 (90 Base) MCG/ACT inhaler    benzonatate (TESSALON) 100 MG capsule    azithromycin (ZITHROMAX) 250 MG tablet    ipratropium-albuterol (DUONEB) 0.5-2.5 (3) MG/3ML nebulizer solution 3 mL  2. Chronic sinusitis, unspecified location J32.9 predniSONE (DELTASONE) 20 MG tablet     On exam diffuse inspiratory and expiratory wheeze in all lung fields, no retractions or increased WOB.  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    Reviewed treatment with Z-Pak, steroids, inhalers cough suppressants Mucinex and symptomatic treatment for  his nasal symptoms.  He believes that since he has been working outside with a leaves that is what is because his continued clear nasal drainage so he was encouraged to avoid this while recovering.  VS repeated his heart rate was 70-80 and pulse ox was 96%  Delsa Grana, PA-C 01/20/18 12:23 PM

## 2018-01-20 NOTE — Patient Instructions (Addendum)
I would do the prescribed medicines and also start using some saline rinses for your nose, steroid nasal spray and an antihistamine like zyrtec, claritin or allegra.  Take the steroids, Zpak and mucinex daily for the cough, and use inhaler as needed for coughing fits, wheeze or shortness of breath.  I prescribed tessalon for cough as well and you can continue other over the counter cough meds as tolerated.  I would want your breathing and coughing to feel significantly improved in the next 5-7 days, and it is normal for some runny nose and cough to linger for a few weeks  F/up if not improving in the next couple weeks.

## 2018-01-26 ENCOUNTER — Encounter: Payer: Self-pay | Admitting: Podiatry

## 2018-01-26 ENCOUNTER — Encounter: Payer: Self-pay | Admitting: Family Medicine

## 2018-02-16 ENCOUNTER — Other Ambulatory Visit: Payer: Self-pay | Admitting: Family Medicine

## 2018-03-24 ENCOUNTER — Encounter: Payer: Self-pay | Admitting: Podiatry

## 2018-03-24 ENCOUNTER — Other Ambulatory Visit: Payer: Self-pay

## 2018-03-24 ENCOUNTER — Ambulatory Visit (INDEPENDENT_AMBULATORY_CARE_PROVIDER_SITE_OTHER): Payer: Medicare Other | Admitting: Podiatry

## 2018-03-24 DIAGNOSIS — E1151 Type 2 diabetes mellitus with diabetic peripheral angiopathy without gangrene: Secondary | ICD-10-CM

## 2018-03-24 DIAGNOSIS — B351 Tinea unguium: Secondary | ICD-10-CM | POA: Diagnosis not present

## 2018-03-24 DIAGNOSIS — M79675 Pain in left toe(s): Secondary | ICD-10-CM | POA: Diagnosis not present

## 2018-03-24 DIAGNOSIS — M79674 Pain in right toe(s): Secondary | ICD-10-CM | POA: Diagnosis not present

## 2018-03-24 NOTE — Patient Instructions (Signed)
Diabetes Mellitus and Foot Care Foot care is an important part of your health, especially when you have diabetes. Diabetes may cause you to have problems because of poor blood flow (circulation) to your feet and legs, which can cause your skin to:  Become thinner and drier.  Break more easily.  Heal more slowly.  Peel and crack. You may also have nerve damage (neuropathy) in your legs and feet, causing decreased feeling in them. This means that you may not notice minor injuries to your feet that could lead to more serious problems. Noticing and addressing any potential problems early is the best way to prevent future foot problems. How to care for your feet Foot hygiene  Wash your feet daily with warm water and mild soap. Do not use hot water. Then, pat your feet and the areas between your toes until they are completely dry. Do not soak your feet as this can dry your skin.  Trim your toenails straight across. Do not dig under them or around the cuticle. File the edges of your nails with an emery board or nail file.  Apply a moisturizing lotion or petroleum jelly to the skin on your feet and to dry, brittle toenails. Use lotion that does not contain alcohol and is unscented. Do not apply lotion between your toes. Shoes and socks  Wear clean socks or stockings every day. Make sure they are not too tight. Do not wear knee-high stockings since they may decrease blood flow to your legs.  Wear shoes that fit properly and have enough cushioning. Always look in your shoes before you put them on to be sure there are no objects inside.  To break in new shoes, wear them for just a few hours a day. This prevents injuries on your feet. Wounds, scrapes, corns, and calluses  Check your feet daily for blisters, cuts, bruises, sores, and redness. If you cannot see the bottom of your feet, use a mirror or ask someone for help.  Do not cut corns or calluses or try to remove them with medicine.  If you  find a minor scrape, cut, or break in the skin on your feet, keep it and the skin around it clean and dry. You may clean these areas with mild soap and water. Do not clean the area with peroxide, alcohol, or iodine.  If you have a wound, scrape, corn, or callus on your foot, look at it several times a day to make sure it is healing and not infected. Check for: ? Redness, swelling, or pain. ? Fluid or blood. ? Warmth. ? Pus or a bad smell. General instructions  Do not cross your legs. This may decrease blood flow to your feet.  Do not use heating pads or hot water bottles on your feet. They may burn your skin. If you have lost feeling in your feet or legs, you may not know this is happening until it is too late.  Protect your feet from hot and cold by wearing shoes, such as at the beach or on hot pavement.  Schedule a complete foot exam at least once a year (annually) or more often if you have foot problems. If you have foot problems, report any cuts, sores, or bruises to your health care provider immediately. Contact a health care provider if:  You have a medical condition that increases your risk of infection and you have any cuts, sores, or bruises on your feet.  You have an injury that is not   healing.  You have redness on your legs or feet.  You feel burning or tingling in your legs or feet.  You have pain or cramps in your legs and feet.  Your legs or feet are numb.  Your feet always feel cold.  You have pain around a toenail. Get help right away if:  You have a wound, scrape, corn, or callus on your foot and: ? You have pain, swelling, or redness that gets worse. ? You have fluid or blood coming from the wound, scrape, corn, or callus. ? Your wound, scrape, corn, or callus feels warm to the touch. ? You have pus or a bad smell coming from the wound, scrape, corn, or callus. ? You have a fever. ? You have a red line going up your leg. Summary  Check your feet every day  for cuts, sores, red spots, swelling, and blisters.  Moisturize feet and legs daily.  Wear shoes that fit properly and have enough cushioning.  If you have foot problems, report any cuts, sores, or bruises to your health care provider immediately.  Schedule a complete foot exam at least once a year (annually) or more often if you have foot problems. This information is not intended to replace advice given to you by your health care provider. Make sure you discuss any questions you have with your health care provider. Document Released: 12/26/1999 Document Revised: 02/09/2017 Document Reviewed: 01/30/2016 Elsevier Interactive Patient Education  2019 [employer]  Onychomycosis/Fungal Toenails  WHAT IS IT? An infection that lies within the keratin of your nail plate that is caused by a fungus.  WHY ME? Fungal infections affect all ages, sexes, races, and creeds.  There may be many factors that predispose you to a fungal infection such as age, coexisting medical conditions such as diabetes, or an autoimmune disease; stress, medications, fatigue, genetics, etc.  Bottom line: fungus thrives in a warm, moist environment and your shoes offer such a location.  IS IT CONTAGIOUS? Theoretically, yes.  You do not want to share shoes, nail clippers or files with someone who has fungal toenails.  Walking around barefoot in the same room or sleeping in the same bed is unlikely to transfer the organism.  It is important to realize, however, that fungus can spread easily from one nail to the next on the same foot.  HOW DO WE TREAT THIS?  There are several ways to treat this condition.  Treatment may depend on many factors such as age, medications, pregnancy, liver and kidney conditions, etc.  It is best to ask your doctor which options are available to you.  1. No treatment.   Unlike many other medical concerns, you can live with this condition.  However for many people this can be a painful condition and  may lead to ingrown toenails or a bacterial infection.  It is recommended that you keep the nails cut short to help reduce the amount of fungal nail. 2. Topical treatment.  These range from herbal remedies to prescription strength nail lacquers.  About 40-50% effective, topicals require twice daily application for approximately 9 to 12 months or until an entirely new nail has grown out.  The most effective topicals are medical grade medications available through physicians offices. 3. Oral antifungal medications.  With an 80-90% cure rate, the most common oral medication requires 3 to 4 months of therapy and stays in your system for a year as the new nail grows out.  Oral antifungal medications do require   blood work to make sure it is a safe drug for you.  A liver function panel will be performed prior to starting the medication and after the first month of treatment.  It is important to have the blood work performed to avoid any harmful side effects.  In general, this medication safe but blood work is required. 4. Laser Therapy.  This treatment is performed by applying a specialized laser to the affected nail plate.  This therapy is noninvasive, fast, and non-painful.  It is not covered by insurance and is therefore, out of pocket.  The results have been very good with a 80-95% cure rate.  [facility] is the only practice in the area to offer this therapy. 5. Permanent Nail Avulsion.  Removing the entire nail so that a new nail will not grow back. 

## 2018-04-03 NOTE — Progress Notes (Signed)
Subjective: Patient presents today with diabetes and cc of painful, discolored, thick toenails which interfere with daily activities. Pain is aggravated when wearing enclosed shoe gear. Pain is getting progressively worse and relieved with periodic professional debridement.  Donald Frizzle, MD is his PCP. Last visit was 10/17/2017.   Current Outpatient Medications:  .  albuterol (PROVENTIL HFA;VENTOLIN HFA) 108 (90 Base) MCG/ACT inhaler, Inhale 2 puffs into the lungs every 4 (four) hours as needed for wheezing or shortness of breath., Disp: 1 Inhaler, Rfl: 0 .  allopurinol (ZYLOPRIM) 300 MG tablet, TAKE ONE TABLET BY MOUTH DAILY, Disp: 90 tablet, Rfl: 3 .  ALPRAZolam (XANAX) 0.5 MG tablet, Take 1 tablet (0.5 mg total) by mouth at bedtime as needed for anxiety., Disp: 30 tablet, Rfl: 2 .  aspirin 81 MG tablet, Take 81 mg by mouth daily., Disp: , Rfl:  .  azithromycin (ZITHROMAX) 250 MG tablet, Take 2 tabs (500 mg) PO q d for 1d, then take 1 tab (250 mg) PO q d for day 2-5, Disp: 6 each, Rfl: 0 .  benazepril (LOTENSIN) 40 MG tablet, TAKE ONE TABLET BY MOUTH DAILY, Disp: 90 tablet, Rfl: 3 .  benzonatate (TESSALON) 100 MG capsule, Take 1 capsule (100 mg total) by mouth 3 (three) times daily as needed for cough., Disp: 30 capsule, Rfl: 0 .  cloNIDine (CATAPRES) 0.2 MG tablet, TAKE 1 TABLET (0.2 MG TOTAL) BY MOUTH 2 (TWO) TIMES DAILY., Disp: 180 tablet, Rfl: 3 .  clotrimazole-betamethasone (LOTRISONE) cream, APPLY ONE APPLICATION TOPICALLY TWICE A DAY, Disp: 45 g, Rfl: 0 .  diclofenac sodium (VOLTAREN) 1 % GEL, Apply 2 grams qid to hands, elbow and knees, Disp: 400 g, Rfl: 5 .  hydrochlorothiazide (HYDRODIURIL) 25 MG tablet, TAKE 1 TABLET (25 MG TOTAL) BY MOUTH DAILY., Disp: 90 tablet, Rfl: 1 .  metoprolol tartrate (LOPRESSOR) 50 MG tablet, TAKE ONE AND ONE-HALF (1 & 1/2) TABLET BY MOUTH TWO TIMES A DAY, Disp: 270 tablet, Rfl: 2 .  pantoprazole (PROTONIX) 40 MG tablet, TAKE ONE TABLET BY MOUTH DAILY,  Disp: 90 tablet, Rfl: 1 .  simvastatin (ZOCOR) 40 MG tablet, TAKE ONE TABLET BY MOUTH DAILY, Disp: 90 tablet, Rfl: 3   No Known Allergies   Objective:  Vascular Examination: Capillary refill time immediate x 10 digits.  Dorsalis pedis pulses 1/4 b/l.  Posterior tibial pulses 0/4 b/l.  Digital hair absent x 10 digits.  Skin temperature gradient WNL  b/l  Dermatological Examination: Skin with normal turgor, texture and tone b/l.  Toenails 1-5 b/l discolored, thick, dystrophic with subungual debris and pain with palpation to nailbeds due to thickness of nails.  Musculoskeletal: Muscle strength 5/5 to all LE muscle groups.  Neurological: Sensation absent with 10 gram monofilament.  Assessment: 1. Painful onychomycosis toenails 1-5 b/l 2. NIDDM with Peripheral arterial disease  Plan: 1. Toenails 1-5 b/l were debrided in length and girth without iatrogenic bleeding. 2. Patient to continue soft, supportive shoe gear daily. 3. Patient to report any pedal injuries to medical professional. immediately. 4. Follow up 3 months. 5. Patient/POA to call should there be a concern in the interim.

## 2018-04-13 ENCOUNTER — Other Ambulatory Visit: Payer: Self-pay

## 2018-04-13 ENCOUNTER — Other Ambulatory Visit: Payer: Medicare Other

## 2018-04-13 DIAGNOSIS — E782 Mixed hyperlipidemia: Secondary | ICD-10-CM | POA: Diagnosis not present

## 2018-04-13 DIAGNOSIS — E119 Type 2 diabetes mellitus without complications: Secondary | ICD-10-CM | POA: Diagnosis not present

## 2018-04-13 DIAGNOSIS — I10 Essential (primary) hypertension: Secondary | ICD-10-CM

## 2018-04-14 LAB — COMPREHENSIVE METABOLIC PANEL
AG Ratio: 2.3 (calc) (ref 1.0–2.5)
ALT: 7 U/L — ABNORMAL LOW (ref 9–46)
AST: 12 U/L (ref 10–35)
Albumin: 3.9 g/dL (ref 3.6–5.1)
Alkaline phosphatase (APISO): 56 U/L (ref 35–144)
BUN/Creatinine Ratio: 19 (calc) (ref 6–22)
BUN: 28 mg/dL — ABNORMAL HIGH (ref 7–25)
CO2: 24 mmol/L (ref 20–32)
Calcium: 9.4 mg/dL (ref 8.6–10.3)
Chloride: 109 mmol/L (ref 98–110)
Creat: 1.46 mg/dL — ABNORMAL HIGH (ref 0.70–1.18)
Globulin: 1.7 g/dL (calc) — ABNORMAL LOW (ref 1.9–3.7)
Glucose, Bld: 133 mg/dL — ABNORMAL HIGH (ref 65–99)
Potassium: 5.2 mmol/L (ref 3.5–5.3)
Sodium: 142 mmol/L (ref 135–146)
Total Bilirubin: 0.9 mg/dL (ref 0.2–1.2)
Total Protein: 5.6 g/dL — ABNORMAL LOW (ref 6.1–8.1)

## 2018-04-14 LAB — CBC WITH DIFFERENTIAL/PLATELET
Absolute Monocytes: 444 cells/uL (ref 200–950)
Basophils Absolute: 30 cells/uL (ref 0–200)
Basophils Relative: 0.5 %
Eosinophils Absolute: 180 cells/uL (ref 15–500)
Eosinophils Relative: 3 %
HCT: 36.8 % — ABNORMAL LOW (ref 38.5–50.0)
Hemoglobin: 12.3 g/dL — ABNORMAL LOW (ref 13.2–17.1)
Lymphs Abs: 1530 cells/uL (ref 850–3900)
MCH: 31.9 pg (ref 27.0–33.0)
MCHC: 33.4 g/dL (ref 32.0–36.0)
MCV: 95.6 fL (ref 80.0–100.0)
MPV: 13.2 fL — ABNORMAL HIGH (ref 7.5–12.5)
Monocytes Relative: 7.4 %
Neutro Abs: 3816 cells/uL (ref 1500–7800)
Neutrophils Relative %: 63.6 %
Platelets: 183 10*3/uL (ref 140–400)
RBC: 3.85 10*6/uL — ABNORMAL LOW (ref 4.20–5.80)
RDW: 12.8 % (ref 11.0–15.0)
Total Lymphocyte: 25.5 %
WBC: 6 10*3/uL (ref 3.8–10.8)

## 2018-04-14 LAB — LIPID PANEL
Cholesterol: 105 mg/dL (ref <200)
HDL: 45 mg/dL (ref >=40)
LDL Cholesterol (Calc): 47 mg/dL (calc)
Non-HDL Cholesterol (Calc): 60 mg/dL (calc) (ref <130)
Total CHOL/HDL Ratio: 2.3 (calc) (ref <5.0)
Triglycerides: 52 mg/dL (ref <150)

## 2018-04-14 LAB — HEMOGLOBIN A1C
Hgb A1c MFr Bld: 5.9 % of total Hgb — ABNORMAL HIGH (ref <5.7)
Mean Plasma Glucose: 123 (calc)
eAG (mmol/L): 6.8 (calc)

## 2018-05-17 ENCOUNTER — Other Ambulatory Visit: Payer: Self-pay | Admitting: Family Medicine

## 2018-05-26 ENCOUNTER — Encounter: Payer: Self-pay | Admitting: Gastroenterology

## 2018-06-01 ENCOUNTER — Other Ambulatory Visit: Payer: Self-pay

## 2018-06-06 ENCOUNTER — Other Ambulatory Visit: Payer: Self-pay

## 2018-06-06 ENCOUNTER — Ambulatory Visit (AMBULATORY_SURGERY_CENTER): Payer: Medicare Other

## 2018-06-06 VITALS — Ht 68.5 in | Wt 156.0 lb

## 2018-06-06 DIAGNOSIS — Z1211 Encounter for screening for malignant neoplasm of colon: Secondary | ICD-10-CM

## 2018-06-06 MED ORDER — PEG 3350-KCL-NA BICARB-NACL 420 G PO SOLR: 4000.0000 mL | Freq: Once | ORAL | 0 refills | Status: AC

## 2018-06-06 NOTE — Progress Notes (Signed)
Denies allergies to eggs or soy products. Denies complication of anesthesia or sedation. Denies use of weight loss medication. Denies use of O2.   Emmi instructions given for colonoscopy.   Pre-Visit was conducted by phone due to Covid 19. Instructions were reviewed and mailed to the patients confirmed home address. Patient was encouraged to call if he had any questions or concerns regarding instructions.

## 2018-06-19 ENCOUNTER — Telehealth: Payer: Self-pay | Admitting: *Deleted

## 2018-06-19 NOTE — Telephone Encounter (Signed)

## 2018-06-20 ENCOUNTER — Other Ambulatory Visit: Payer: Self-pay

## 2018-06-20 ENCOUNTER — Encounter: Payer: Self-pay | Admitting: Gastroenterology

## 2018-06-20 ENCOUNTER — Ambulatory Visit (AMBULATORY_SURGERY_CENTER): Payer: Medicare Other | Admitting: Gastroenterology

## 2018-06-20 ENCOUNTER — Encounter: Payer: Medicare Other | Admitting: Gastroenterology

## 2018-06-20 VITALS — BP 122/64 | HR 52 | Temp 97.8°F | Resp 54 | Ht 68.0 in | Wt 156.0 lb

## 2018-06-20 DIAGNOSIS — Z86010 Personal history of colonic polyps: Secondary | ICD-10-CM | POA: Diagnosis not present

## 2018-06-20 DIAGNOSIS — Z1211 Encounter for screening for malignant neoplasm of colon: Secondary | ICD-10-CM | POA: Diagnosis not present

## 2018-06-20 DIAGNOSIS — K5730 Diverticulosis of large intestine without perforation or abscess without bleeding: Secondary | ICD-10-CM

## 2018-06-20 MED ORDER — SODIUM CHLORIDE 0.9 % IV SOLN: 500.0000 mL | Freq: Once | INTRAVENOUS | Status: DC

## 2018-06-20 NOTE — Progress Notes (Signed)
Pt's states no medical or surgical changes since previsit or office visit. 

## 2018-06-20 NOTE — Patient Instructions (Signed)
YOU HAD AN ENDOSCOPIC PROCEDURE TODAY AT THE [facility]:   Refer to the procedure report that was given to you for any specific questions about what was found during the examination.  If the procedure report does not answer your questions, please call your gastroenterologist to clarify.  If you requested that your care partner not be given the details of your procedure findings, then the procedure report has been included in a sealed envelope for you to review at your convenience later.  YOU SHOULD EXPECT: Some feelings of bloating in the abdomen. Passage of more gas than usual.  Walking can help get rid of the air that was put into your GI tract during the procedure and reduce the bloating. If you had a lower endoscopy (such as a colonoscopy or flexible sigmoidoscopy) you may notice spotting of blood in your stool or on the toilet paper. If you underwent a bowel prep for your procedure, you may not have a normal bowel movement for a few days.  Please Note:  You might notice some irritation and congestion in your nose or some drainage.  This is from the oxygen used during your procedure.  There is no need for concern and it should clear up in a day or so.  SYMPTOMS TO REPORT IMMEDIATELY:   Following lower endoscopy (colonoscopy or flexible sigmoidoscopy):  Excessive amounts of blood in the stool  Significant tenderness or worsening of abdominal pains  Swelling of the abdomen that is new, acute  Fever of 100F or higher  For urgent or emergent issues, a gastroenterologist can be reached at any hour by calling [phone].   DIET:  We do recommend a small meal at first, but then you may proceed to your regular diet.  Drink plenty of fluids but you should avoid alcoholic beverages for 24 hours.  ACTIVITY:  You should plan to take it easy for the rest of today and you should NOT DRIVE or use heavy machinery until tomorrow (because of the sedation medicines used during the test).     FOLLOW UP: Our staff will call the number listed on your records 48-72 hours following your procedure to check on you and address any questions or concerns that you may have regarding the information given to you following your procedure. If we do not reach you, we will leave a message.  We will attempt to reach you two times.  During this call, we will ask if you have developed any symptoms of COVID 19. If you develop any symptoms (ie: fever, flu-like symptoms, shortness of breath, cough etc.) before then, please call [phone].  If you test positive for Covid 19 in the 2 weeks post procedure, please call and report this information to us.    If any biopsies were taken you will be contacted by phone or by letter within the next 1-3 weeks.  Please call us at [phone] if you have not heard about the biopsies in 3 weeks.    SIGNATURES/CONFIDENTIALITY: You and/or your care partner have signed paperwork which will be entered into your electronic medical record.  These signatures attest to the fact that that the information above on your After Visit Summary has been reviewed and is understood.  Full responsibility of the confidentiality of this discharge information lies with you and/or your care-partner. 

## 2018-06-20 NOTE — Op Note (Signed)
[facility] Patient Name: Donald Rios Procedure Date: 06/20/2018 8:23 AM MRN: [id number] Endoscopist: Milus Banister , MD Age: 78 Referring MD:  Date of Birth: 11-30-1940 Gender: Male Account #: [id number] Procedure:                Colonoscopy Indications:              Screening for colorectal malignant neoplasm;                            colonoscopy 2010 single small HP removed Medicines:                Monitored Anesthesia Care Procedure:                Pre-Anesthesia Assessment:                           - Prior to the procedure, a History and Physical                            was performed, and patient medications and                            allergies were reviewed. The patient's tolerance of                            previous anesthesia was also reviewed. The risks                            and benefits of the procedure and the sedation                            options and risks were discussed with the patient.                            All questions were answered, and informed consent                            was obtained. Prior Anticoagulants: The patient has                            taken no previous anticoagulant or antiplatelet                            agents. ASA Grade Assessment: II - A patient with                            mild systemic disease. After reviewing the risks                            and benefits, the patient was deemed in                            satisfactory condition to undergo the procedure.  After obtaining informed consent, the colonoscope                            was passed under direct vision. Throughout the                            procedure, the patient's blood pressure, pulse, and                            oxygen saturations were monitored continuously. The                            Colonoscope was introduced through the anus and                            advanced to the the cecum,  identified by                            appendiceal orifice and ileocecal valve. The                            colonoscopy was performed without difficulty. The                            patient tolerated the procedure well. The quality                            of the bowel preparation was good. The ileocecal                            valve, appendiceal orifice, and rectum were                            photographed. Scope In: 8:36:40 AM Scope Out: 7:78:24 AM Scope Withdrawal Time: 0 hours 6 minutes 43 seconds  Total Procedure Duration: 0 hours 9 minutes 38 seconds  Findings:                 Multiple small and large-mouthed diverticula were                            found in the left colon.                           The exam was otherwise without abnormality on                            direct and retroflexion views. Complications:            No immediate complications. Estimated blood loss:                            None. Estimated Blood Loss:     Estimated blood loss: none. Impression:               - Diverticulosis in the left colon.                           -  The examination was otherwise normal on direct                            and retroflexion views.                           - No polyps or cancers. Recommendation:           - Patient has a contact number available for                            emergencies. The signs and symptoms of potential                            delayed complications were discussed with the                            patient. Return to normal activities tomorrow.                            Written discharge instructions were provided to the                            patient.                           - Resume previous diet.                           - Continue present medications.                           You do not need any further colon cancer screening                            tests (including stool testing). These types of                             tests generally stop around age 77-80. Milus Banister, MD 06/20/2018 8:48:53 AM This report has been signed electronically.

## 2018-06-20 NOTE — Progress Notes (Signed)
To PACU,VSs. Report to [email or web address] 

## 2018-06-22 ENCOUNTER — Telehealth: Payer: Self-pay | Admitting: *Deleted

## 2018-06-22 NOTE — Telephone Encounter (Signed)
  Follow up Call-  Call back number 06/20/2018  Post procedure Call Back phone  # [phone]  Permission to leave phone message Yes  Some recent data might be hidden     Patient questions:  Do you have a fever, pain , or abdominal swelling? No. Pain Score  0 *  Have you tolerated food without any problems? Yes.    Have you been able to return to your normal activities? Yes.    Do you have any questions about your discharge instructions: Diet   No. Medications  No. Follow up visit  No.  Do you have questions or concerns about your Care? No.  Actions: * If pain score is 4 or above: No action needed, pain <4.  1. Have you developed a fever since your procedure? no  2.   Have you had an respiratory symptoms (SOB or cough) since your procedure? no  3.   Have you tested positive for COVID 19 since your procedure no  4.   Have you had any family members/close contacts diagnosed with the COVID 19 since your procedure?  no   If yes to any of these questions please route to Joylene John, RN and Alphonsa Gin, Therapist, sports.

## 2018-06-23 ENCOUNTER — Ambulatory Visit: Payer: Medicare Other | Admitting: Podiatry

## 2018-07-07 ENCOUNTER — Encounter (HOSPITAL_COMMUNITY): Payer: Self-pay | Admitting: Internal Medicine

## 2018-07-12 ENCOUNTER — Telehealth (HOSPITAL_COMMUNITY): Payer: Self-pay | Admitting: *Deleted

## 2018-07-12 NOTE — Telephone Encounter (Signed)
Left message on voicemail per DPR in reference to upcoming appointment scheduled on 07/18/18 at 7:30 with detailed instructions given per Myocardial Perfusion Study Information Sheet for the test. LM to arrive 15 minutes early, and that it is imperative to arrive on time for appointment to keep from having the test rescheduled. If you need to cancel or reschedule your appointment, please call the office within 24 hours of your appointment. Failure to do so may result in a cancellation of your appointment, and a $50 no show fee. Phone number given for call back for any questions.

## 2018-07-18 ENCOUNTER — Other Ambulatory Visit: Payer: Self-pay

## 2018-07-18 ENCOUNTER — Encounter (HOSPITAL_COMMUNITY): Payer: Self-pay

## 2018-07-18 ENCOUNTER — Ambulatory Visit (HOSPITAL_COMMUNITY): Payer: Medicare Other | Attending: Internal Medicine

## 2018-07-18 DIAGNOSIS — Z951 Presence of aortocoronary bypass graft: Secondary | ICD-10-CM | POA: Insufficient documentation

## 2018-07-18 MED ORDER — TECHNETIUM TC 99M TETROFOSMIN IV KIT
9.6000 | PACK | Freq: Once | INTRAVENOUS | Status: AC | PRN
  Administered 2018-07-18: 9.6 via INTRAVENOUS
  Filled 2018-07-18: qty 10

## 2018-07-18 MED ORDER — TECHNETIUM TC 99M TETROFOSMIN IV KIT
32.2000 | PACK | Freq: Once | INTRAVENOUS | Status: AC | PRN
  Administered 2018-07-18: 32.2 via INTRAVENOUS
  Filled 2018-07-18: qty 33

## 2018-07-18 MED ORDER — REGADENOSON 0.4 MG/5ML IV SOLN
0.4000 mg | Freq: Once | INTRAVENOUS | Status: AC
  Administered 2018-07-18: 0.4 mg via INTRAVENOUS

## 2018-07-19 LAB — MYOCARDIAL PERFUSION IMAGING
LV dias vol: 121 mL (ref 62–150)
LV sys vol: 59 mL
Peak HR: 79 {beats}/min
Rest HR: 50 {beats}/min
SDS: 2
SRS: 12
SSS: 14
TID: 1.08

## 2018-07-25 ENCOUNTER — Other Ambulatory Visit: Payer: Self-pay

## 2018-07-25 ENCOUNTER — Ambulatory Visit (INDEPENDENT_AMBULATORY_CARE_PROVIDER_SITE_OTHER): Payer: Medicare Other | Admitting: Family Medicine

## 2018-07-25 ENCOUNTER — Encounter: Payer: Self-pay | Admitting: Family Medicine

## 2018-07-25 VITALS — BP 102/56 | HR 60 | Temp 98.1°F | Resp 14 | Ht 68.5 in | Wt 156.0 lb

## 2018-07-25 DIAGNOSIS — A6920 Lyme disease, unspecified: Secondary | ICD-10-CM | POA: Diagnosis not present

## 2018-07-25 MED ORDER — DOXYCYCLINE HYCLATE 100 MG PO TABS: 100.0000 mg | ORAL_TABLET | Freq: Two times a day (BID) | ORAL | 0 refills | Status: DC

## 2018-07-25 NOTE — Progress Notes (Signed)
Subjective:    Patient ID: Donald Rios, male    DOB: 11-27-1940, 78 y.o.   MRN: [id number]  HPI  Recently, patient found a tick located behind his left knee on the lateral side.  The tick had been adherent for quite some time.  The tick appeared to be a [employer] tick.  Tick was removed.  Now the patient has developed a red ring around the location of the tick bite with central clearing.  The red ring is approximately 4 cm in diameter around the scab where the tick was located.  It has an irregular border but is circular in nature.  Patient denies any fever or chills or nausea or vomiting or joint pain or systemic symptoms. Past Medical History:  Diagnosis Date  . Acute gastric ulcer with hemorrhage 05/06/2013  . Arthritis   . Asthma   . Atrial enlargement, left   . 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 Cataract   . COPD (chronic obstructive pulmonary disease) ([place])   . Diabetes mellitus   . Diabetic nephropathy ([place])   . Diverticulosis   . ED (erectile dysfunction)   . GERD (gastroesophageal reflux disease)   . Gout   . Hearing loss   . Hiatal hernia 05/2008   EGD with HH and reflux esophagitis.   Marland Kitchen History of MI (myocardial infarction)   . History of nuclear stress test 08/07/2010   dipyridamole; EKG negative for ischemia, low risk scan   . Hyperlipidemia   . Hyperplastic colon polyp 05/2008  . Hypertension   . Ischemic cardiomyopathy    EF 45%, with inferior wall motion abnormality   . Myocardial infarction ([place])   . Valvular regurgitation    mitral and tricuspid (mild)   Past Surgical History:  Procedure Laterality Date  . CORONARY ARTERY BYPASS GRAFT  2000   LIMA to LAD, free IMA to OM2, sequential graft to PLA & PLD  . ESOPHAGOGASTRODUODENOSCOPY N/A 05/07/2013   Procedure: ESOPHAGOGASTRODUODENOSCOPY (EGD);  Surgeon: Gatha Mayer, MD;  Location[facility];  Service: Endoscopy;  Laterality: N/A;  . TRANSTHORACIC ECHOCARDIOGRAM  09/07/2010   EF 48-25%;  LV systolic function mildly reduced; LA mildly dilated; mild-mod MR & mild-mod TR; aortic root sclerosis/calcification;    Current Outpatient Medications on File Prior to Visit  Medication Sig Dispense Refill  . allopurinol (ZYLOPRIM) 300 MG tablet TAKE ONE TABLET BY MOUTH DAILY 90 tablet 2  . ALPRAZolam (XANAX) 0.5 MG tablet Take 1 tablet (0.5 mg total) by mouth at bedtime as needed for anxiety. 30 tablet 2  . aspirin 81 MG tablet Take 81 mg by mouth daily.    . benazepril (LOTENSIN) 40 MG tablet TAKE ONE TABLET BY MOUTH DAILY 90 tablet 2  . cloNIDine (CATAPRES) 0.2 MG tablet TAKE ONE TABLET BY MOUTH TWICE A DAY 180 tablet 2  . hydrochlorothiazide (HYDRODIURIL) 25 MG tablet TAKE 1 TABLET (25 MG TOTAL) BY MOUTH DAILY. 90 tablet 1  . metoprolol tartrate (LOPRESSOR) 50 MG tablet TAKE ONE AND ONE-HALF (1 & 1/2) TABLET BY MOUTH TWO TIMES A DAY 270 tablet 2  . pantoprazole (PROTONIX) 40 MG tablet TAKE ONE TABLET BY MOUTH DAILY 90 tablet 0  . simvastatin (ZOCOR) 40 MG tablet TAKE ONE TABLET BY MOUTH DAILY 90 tablet 2  . albuterol (PROVENTIL HFA;VENTOLIN HFA) 108 (90 Base) MCG/ACT inhaler Inhale 2 puffs into the lungs every 4 (four) hours as needed for wheezing or shortness of breath. (Patient not taking:  Reported on 06/06/2018) 1 Inhaler 0   No current facility-administered medications on file prior to visit.    No Known Allergies Social History   Socioeconomic History  . Marital status: Married    Spouse name: Not on file  . Number of children: 1  . Years of education: Not on file  . Highest education level: Not on file  Occupational History  . Not on file  Social Needs  . Financial resource strain: Not on file  . Food insecurity    Worry: Not on file    Inability: Not on file  . Transportation needs    Medical: Not on file    Non-medical: Not on file  Tobacco Use  . Smoking status: Former Smoker    Quit date: 11/04/1985    Years since quitting: 32.7  . Smokeless tobacco: Never Used   Substance and Sexual Activity  . Alcohol use: Yes    Alcohol/week: 2.0 - 3.0 standard drinks    Types: 2 - 3 Cans of beer per week  . Drug use: No  . Sexual activity: Not on file  Lifestyle  . Physical activity    Days per week: Not on file    Minutes per session: Not on file  . Stress: Not on file  Relationships  . Social Herbalist on phone: Not on file    Gets together: Not on file    Attends religious service: Not on file    Active member of club or organization: Not on file    Attends meetings of clubs or organizations: Not on file    Relationship status: Not on file  . Intimate partner violence    Fear of current or ex partner: Not on file    Emotionally abused: Not on file    Physically abused: Not on file    Forced sexual activity: Not on file  Other Topics Concern  . Not on file  Social History Narrative  . Not on file     Review of Systems  All other systems reviewed and are negative.      Objective:   Physical Exam Vitals signs reviewed.  Constitutional:      Appearance: Normal appearance.  Cardiovascular:     Rate and Rhythm: Normal rate and regular rhythm.  Pulmonary:     Effort: Pulmonary effort is normal.     Breath sounds: Normal breath sounds.  Musculoskeletal:     Left knee: He exhibits erythema.       Legs:  Skin:    Findings: Erythema and rash present.  Neurological:     Mental Status: He is alert.           Assessment & Plan:  1. Erythema migrans (Lyme disease) I will treat the patient with doxycycline 100 mg p.o. twice daily for 10 days.

## 2018-08-15 ENCOUNTER — Other Ambulatory Visit: Payer: Self-pay | Admitting: Family Medicine

## 2018-10-12 ENCOUNTER — Other Ambulatory Visit: Payer: Self-pay

## 2018-10-13 ENCOUNTER — Other Ambulatory Visit: Payer: Medicare Other

## 2018-10-13 DIAGNOSIS — E119 Type 2 diabetes mellitus without complications: Secondary | ICD-10-CM

## 2018-10-13 DIAGNOSIS — Z8679 Personal history of other diseases of the circulatory system: Secondary | ICD-10-CM

## 2018-10-13 DIAGNOSIS — I10 Essential (primary) hypertension: Secondary | ICD-10-CM | POA: Diagnosis not present

## 2018-10-13 DIAGNOSIS — E782 Mixed hyperlipidemia: Secondary | ICD-10-CM | POA: Diagnosis not present

## 2018-10-13 DIAGNOSIS — I5022 Chronic systolic (congestive) heart failure: Secondary | ICD-10-CM | POA: Diagnosis not present

## 2018-10-14 LAB — COMPLETE METABOLIC PANEL WITHOUT GFR
AG Ratio: 2.2 (calc) (ref 1.0–2.5)
ALT: 9 U/L (ref 9–46)
AST: 13 U/L (ref 10–35)
Albumin: 3.9 g/dL (ref 3.6–5.1)
Alkaline phosphatase (APISO): 56 U/L (ref 35–144)
BUN: 12 mg/dL (ref 7–25)
CO2: 27 mmol/L (ref 20–32)
Calcium: 9.6 mg/dL (ref 8.6–10.3)
Chloride: 102 mmol/L (ref 98–110)
Creat: 1.03 mg/dL (ref 0.70–1.18)
GFR, Est African American: 81 mL/min/1.73m2 (ref >=60)
GFR, Est Non African American: 70 mL/min/1.73m2 (ref >=60)
Globulin: 1.8 g/dL — ABNORMAL LOW (ref 1.9–3.7)
Glucose, Bld: 129 mg/dL — ABNORMAL HIGH (ref 65–99)
Potassium: 4 mmol/L (ref 3.5–5.3)
Sodium: 137 mmol/L (ref 135–146)
Total Bilirubin: 0.9 mg/dL (ref 0.2–1.2)
Total Protein: 5.7 g/dL — ABNORMAL LOW (ref 6.1–8.1)

## 2018-10-14 LAB — CBC WITH DIFFERENTIAL/PLATELET
Absolute Monocytes: 566 cells/uL (ref 200–950)
Basophils Absolute: 28 cells/uL (ref 0–200)
Basophils Relative: 0.4 %
Eosinophils Absolute: 242 cells/uL (ref 15–500)
Eosinophils Relative: 3.5 %
HCT: 38.2 % — ABNORMAL LOW (ref 38.5–50.0)
Hemoglobin: 12.5 g/dL — ABNORMAL LOW (ref 13.2–17.1)
Lymphs Abs: 1553 cells/uL (ref 850–3900)
MCH: 31.4 pg (ref 27.0–33.0)
MCHC: 32.7 g/dL (ref 32.0–36.0)
MCV: 96 fL (ref 80.0–100.0)
MPV: 12.2 fL (ref 7.5–12.5)
Monocytes Relative: 8.2 %
Neutro Abs: 4513 cells/uL (ref 1500–7800)
Neutrophils Relative %: 65.4 %
Platelets: 178 10*3/uL (ref 140–400)
RBC: 3.98 10*6/uL — ABNORMAL LOW (ref 4.20–5.80)
RDW: 13.4 % (ref 11.0–15.0)
Total Lymphocyte: 22.5 %
WBC: 6.9 10*3/uL (ref 3.8–10.8)

## 2018-10-14 LAB — LIPID PANEL
Cholesterol: 109 mg/dL (ref <200)
HDL: 52 mg/dL (ref >=40)
LDL Cholesterol (Calc): 43 mg/dL (calc)
Non-HDL Cholesterol (Calc): 57 mg/dL (calc) (ref <130)
Total CHOL/HDL Ratio: 2.1 (calc) (ref <5.0)
Triglycerides: 60 mg/dL (ref <150)

## 2018-10-14 LAB — HEMOGLOBIN A1C
Hgb A1c MFr Bld: 5.8 %{Hb} — ABNORMAL HIGH (ref <5.7)
Mean Plasma Glucose: 120 (calc)
eAG (mmol/L): 6.6 (calc)

## 2018-10-17 ENCOUNTER — Other Ambulatory Visit: Payer: Self-pay

## 2018-10-17 ENCOUNTER — Encounter: Payer: Self-pay | Admitting: Family Medicine

## 2018-10-17 ENCOUNTER — Ambulatory Visit (INDEPENDENT_AMBULATORY_CARE_PROVIDER_SITE_OTHER): Payer: Medicare Other | Admitting: Family Medicine

## 2018-10-17 VITALS — BP 134/68 | HR 88 | Temp 97.9°F | Resp 12 | Ht 68.5 in | Wt 152.0 lb

## 2018-10-17 DIAGNOSIS — R634 Abnormal weight loss: Secondary | ICD-10-CM

## 2018-10-17 DIAGNOSIS — E782 Mixed hyperlipidemia: Secondary | ICD-10-CM | POA: Diagnosis not present

## 2018-10-17 DIAGNOSIS — E119 Type 2 diabetes mellitus without complications: Secondary | ICD-10-CM | POA: Diagnosis not present

## 2018-10-17 DIAGNOSIS — Z8679 Personal history of other diseases of the circulatory system: Secondary | ICD-10-CM

## 2018-10-17 DIAGNOSIS — Z23 Encounter for immunization: Secondary | ICD-10-CM | POA: Diagnosis not present

## 2018-10-17 DIAGNOSIS — I10 Essential (primary) hypertension: Secondary | ICD-10-CM | POA: Diagnosis not present

## 2018-10-17 MED ORDER — MIRTAZAPINE 30 MG PO TABS: 30.0000 mg | ORAL_TABLET | Freq: Every day | ORAL | 5 refills | Status: DC

## 2018-10-17 NOTE — Progress Notes (Signed)
Subjective:    Patient ID: Donald Rios, male    DOB: 03-03-1940, 78 y.o.   MRN: [id number]  Medication Refill   Wt Readings from Last 3 Encounters:  10/17/18 152 lb (68.9 kg)  07/25/18 156 lb (70.8 kg)  07/18/18 156 lb (70.8 kg)    Patient is here today for recheck of his chronic medical problems.  However he reports poor appetite.  He also recently got dentures which make it hard for him to chew and as result he has not been eating well.  He has lost weight.  He believes that he is lost close to 10 pounds since early this spring.  He also reports rumbling bowel sounds.  He denies any nausea.  He denies any vomiting.  He denies any melena.  He denies any hematochezia.  He denies any GERD.  He denies any indigestion or stomach pain.  However he states that his stomach is always rumbling.  He denies any fevers or chills.  He denies any chest pain or shortness of breath or dyspnea on exertion. Lab on 10/13/2018  Component Date Value Ref Range Status  . Hgb A1c MFr Bld 10/13/2018 5.8* <5.7 % of total Hgb Final   Comment: For someone without known diabetes, a hemoglobin  A1c value between 5.7% and 6.4% is consistent with prediabetes and should be confirmed with a  follow-up test. . For someone with known diabetes, a value <7% indicates that their diabetes is well controlled. A1c targets should be individualized based on duration of diabetes, age, comorbid conditions, and other considerations. . This assay result is consistent with an increased risk of diabetes. . Currently, no consensus exists regarding use of hemoglobin A1c for diagnosis of diabetes for children. .   . Mean Plasma Glucose 10/13/2018 120  (calc) Final  . eAG (mmol/L) 10/13/2018 6.6  (calc) Final  . Cholesterol 10/13/2018 109  <200 mg/dL Final  . HDL 10/13/2018 52  > OR = 40 mg/dL Final  . Triglycerides 10/13/2018 60  <150 mg/dL Final  . LDL Cholesterol (Calc) 10/13/2018 43  mg/dL (calc) Final   Comment:  Reference range: <100 . Desirable range <100 mg/dL for primary prevention;   <70 mg/dL for patients with CHD or diabetic patients  with > or = 2 CHD risk factors. Marland Kitchen LDL-C is now calculated using the Martin-Hopkins  calculation, which is a validated novel method providing  better accuracy than the Friedewald equation in the  estimation of LDL-C.  Cresenciano Genre et al. Annamaria Helling. [id number]): 2061-2068  (http://education.[facility].com/faq/FAQ164)   . Total CHOL/HDL Ratio 10/13/2018 2.1  <5.0 (calc) Final  . Non-HDL Cholesterol (Calc) 10/13/2018 57  <130 mg/dL (calc) Final   Comment: For patients with diabetes plus 1 major ASCVD risk  factor, treating to a non-HDL-C goal of <100 mg/dL  (LDL-C of <70 mg/dL) is considered a therapeutic  option.   . Glucose, Bld 10/13/2018 129* 65 - 99 mg/dL Final   Comment: .            Fasting reference interval . For someone without known diabetes, a glucose value >125 mg/dL indicates that they may have diabetes and this should be confirmed with a follow-up test. .   . BUN 10/13/2018 12  7 - 25 mg/dL Final  . Creat 10/13/2018 1.03  0.70 - 1.18 mg/dL Final   Comment: For patients >4 years of age, the reference limit for Creatinine is approximately 13% higher for people identified as African-American. Marland Kitchen   Marland Kitchen  GFR, Est Non African American 10/13/2018 70  > OR = 60 mL/min/1.96m2 Final  . GFR, Est African American 10/13/2018 81  > OR = 60 mL/min/1.34m2 Final  . BUN/Creatinine Ratio [id number] NOT APPLICABLE  6 - 22 (calc) Final  . Sodium 10/13/2018 137  135 - 146 mmol/L Final  . Potassium 10/13/2018 4.0  3.5 - 5.3 mmol/L Final  . Chloride 10/13/2018 102  98 - 110 mmol/L Final  . CO2 10/13/2018 27  20 - 32 mmol/L Final  . Calcium 10/13/2018 9.6  8.6 - 10.3 mg/dL Final  . Total Protein 10/13/2018 5.7* 6.1 - 8.1 g/dL Final  . Albumin 10/13/2018 3.9  3.6 - 5.1 g/dL Final  . Globulin 10/13/2018 1.8* 1.9 - 3.7 g/dL (calc) Final  . AG Ratio 10/13/2018  2.2  1.0 - 2.5 (calc) Final  . Total Bilirubin 10/13/2018 0.9  0.2 - 1.2 mg/dL Final  . Alkaline phosphatase (APISO) 10/13/2018 56  35 - 144 U/L Final  . AST 10/13/2018 13  10 - 35 U/L Final  . ALT 10/13/2018 9  9 - 46 U/L Final  . WBC 10/13/2018 6.9  3.8 - 10.8 Thousand/uL Final  . RBC 10/13/2018 3.98* 4.20 - 5.80 Million/uL Final  . Hemoglobin 10/13/2018 12.5* 13.2 - 17.1 g/dL Final  . HCT 10/13/2018 38.2* 38.5 - 50.0 % Final  . MCV 10/13/2018 96.0  80.0 - 100.0 fL Final  . MCH 10/13/2018 31.4  27.0 - 33.0 pg Final  . MCHC 10/13/2018 32.7  32.0 - 36.0 g/dL Final  . RDW 10/13/2018 13.4  11.0 - 15.0 % Final  . Platelets 10/13/2018 178  140 - 400 Thousand/uL Final  . MPV 10/13/2018 12.2  7.5 - 12.5 fL Final  . Neutro Abs 10/13/2018 4,513  1,500 - 7,800 cells/uL Final  . Lymphs Abs 10/13/2018 1,553  850 - 3,900 cells/uL Final  . Absolute Monocytes 10/13/2018 566  200 - 950 cells/uL Final  . Eosinophils Absolute 10/13/2018 242  15 - 500 cells/uL Final  . Basophils Absolute 10/13/2018 28  0 - 200 cells/uL Final  . Neutrophils Relative % 10/13/2018 65.4  % Final  . Total Lymphocyte 10/13/2018 22.5  % Final  . Monocytes Relative 10/13/2018 8.2  % Final  . Eosinophils Relative 10/13/2018 3.5  % Final  . Basophils Relative 10/13/2018 0.4  % Final    Past Medical History:  Diagnosis Date  . Acute gastric ulcer with hemorrhage 05/06/2013  . Arthritis   . Asthma   . Atrial enlargement, left   . 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 Cataract   . COPD (chronic obstructive pulmonary disease) ([place])   . Diabetes mellitus   . Diabetic nephropathy ([place])   . Diverticulosis   . ED (erectile dysfunction)   . GERD (gastroesophageal reflux disease)   . Gout   . Hearing loss   . Hiatal hernia 05/2008   EGD with HH and reflux esophagitis.   Marland Kitchen History of MI (myocardial infarction)   . History of nuclear stress test 08/07/2010   dipyridamole; EKG negative for ischemia, low risk scan   .  Hyperlipidemia   . Hyperplastic colon polyp 05/2008  . Hypertension   . Ischemic cardiomyopathy    EF 45%, with inferior wall motion abnormality   . Myocardial infarction ([place])   . Valvular regurgitation    mitral and tricuspid (mild)   Past Surgical History:  Procedure Laterality Date  . CORONARY ARTERY BYPASS GRAFT  2000  LIMA to LAD, free IMA to OM2, sequential graft to PLA & PLD  . ESOPHAGOGASTRODUODENOSCOPY N/A 05/07/2013   Procedure: ESOPHAGOGASTRODUODENOSCOPY (EGD);  Surgeon: Gatha Mayer, MD;  Location: [facility] ENDOSCOPY;  Service: Endoscopy;  Laterality: N/A;  . TRANSTHORACIC ECHOCARDIOGRAM  09/07/2010   EF [id number]; LV systolic function mildly reduced; LA mildly dilated; mild-mod MR & mild-mod TR; aortic root sclerosis/calcification;    Current Outpatient Medications on File Prior to Visit  Medication Sig Dispense Refill  . allopurinol (ZYLOPRIM) 300 MG tablet TAKE ONE TABLET BY MOUTH DAILY 90 tablet 2  . ALPRAZolam (XANAX) 0.5 MG tablet Take 1 tablet (0.5 mg total) by mouth at bedtime as needed for anxiety. 30 tablet 2  . aspirin 81 MG tablet Take 81 mg by mouth daily.    . benazepril (LOTENSIN) 40 MG tablet TAKE ONE TABLET BY MOUTH DAILY 90 tablet 2  . cloNIDine (CATAPRES) 0.2 MG tablet TAKE ONE TABLET BY MOUTH TWICE A DAY 180 tablet 2  . hydrochlorothiazide (HYDRODIURIL) 25 MG tablet TAKE ONE TABLET BY MOUTH DAILY 90 tablet 2  . metoprolol tartrate (LOPRESSOR) 50 MG tablet TAKE ONE AND ONE-HALF (1 & 1/2) TABLET BY MOUTH TWO TIMES A DAY 270 tablet 3  . simvastatin (ZOCOR) 40 MG tablet TAKE ONE TABLET BY MOUTH DAILY 90 tablet 2  . pantoprazole (PROTONIX) 40 MG tablet TAKE ONE TABLET BY MOUTH DAILY (Patient not taking: Reported on 10/17/2018) 90 tablet 3   No current facility-administered medications on file prior to visit.    No Known Allergies Social History   Socioeconomic History  . Marital status: Married    Spouse name: Not on file  . Number of children: 1  . Years of  education: Not on file  . Highest education level: Not on file  Occupational History  . Not on file  Social Needs  . Financial resource strain: Not on file  . Food insecurity    Worry: Not on file    Inability: Not on file  . Transportation needs    Medical: Not on file    Non-medical: Not on file  Tobacco Use  . Smoking status: Former Smoker    Quit date: 11/04/1985    Years since quitting: 32.9  . Smokeless tobacco: Never Used  Substance and Sexual Activity  . Alcohol use: Yes    Alcohol/week: 2.0 - 3.0 standard drinks    Types: 2 - 3 Cans of beer per week  . Drug use: No  . Sexual activity: Not on file  Lifestyle  . Physical activity    Days per week: Not on file    Minutes per session: Not on file  . Stress: Not on file  Relationships  . Social Herbalist on phone: Not on file    Gets together: Not on file    Attends religious service: Not on file    Active member of club or organization: Not on file    Attends meetings of clubs or organizations: Not on file    Relationship status: Not on file  . Intimate partner violence    Fear of current or ex partner: Not on file    Emotionally abused: Not on file    Physically abused: Not on file    Forced sexual activity: Not on file  Other Topics Concern  . Not on file  Social History Narrative  . Not on file     Review of Systems  All other systems reviewed and  are negative.      Objective:   Physical Exam  Constitutional: He appears well-developed and well-nourished.  Neck: Neck supple. No JVD present. No thyromegaly present.  Cardiovascular: Normal rate, regular rhythm, normal heart sounds and intact distal pulses.  No murmur heard. Pulmonary/Chest: Effort normal and breath sounds normal. No respiratory distress. He has no wheezes. He has no rales. He exhibits no tenderness.  Abdominal: Soft. Bowel sounds are normal. He exhibits no distension and no mass. There is no abdominal tenderness. There is no  rebound and no guarding.  Musculoskeletal:        General: No edema.  Lymphadenopathy:    He has no cervical adenopathy.  Skin: No rash noted.  Vitals reviewed.         Assessment & Plan:  Weight loss - Plan: Lipase, Sedimentation rate  Needs flu shot - Plan: Flu Vaccine QUAD High Dose(Fluad)  Type 2 diabetes mellitus without complication, without long-term current use of insulin (HCC)  Mixed hyperlipidemia  Benign essential HTN  History of ASCVD  Patient's lab work is excellent.  His A1c is well controlled at 5.8.  His LDL cholesterol is well below 70.  His blood pressures well controlled today.  He received his flu shot.  I believe his weight loss is likely due to his poor appetite and inadequate calorie consumption.  Therefore I recommended adding an appetite stimulant such as Remeron 30 mg p.o. nightly and supplementing his diet with 1 can of Glucerna a day.  Meanwhile we will check a lipase as well as a sedimentation rate.

## 2018-10-18 LAB — SEDIMENTATION RATE: Sed Rate: 2 mm/h (ref 0–20)

## 2018-10-18 LAB — LIPASE: Lipase: 46 U/L (ref 7–60)

## 2018-11-18 ENCOUNTER — Other Ambulatory Visit: Payer: Self-pay | Admitting: Family Medicine

## 2018-11-21 ENCOUNTER — Other Ambulatory Visit: Payer: Self-pay

## 2018-11-21 ENCOUNTER — Encounter: Payer: Self-pay | Admitting: Family Medicine

## 2018-11-21 ENCOUNTER — Ambulatory Visit (INDEPENDENT_AMBULATORY_CARE_PROVIDER_SITE_OTHER): Payer: Medicare Other | Admitting: Family Medicine

## 2018-11-21 DIAGNOSIS — E440 Moderate protein-calorie malnutrition: Secondary | ICD-10-CM

## 2018-11-21 DIAGNOSIS — E46 Unspecified protein-calorie malnutrition: Secondary | ICD-10-CM | POA: Insufficient documentation

## 2018-11-21 DIAGNOSIS — I10 Essential (primary) hypertension: Secondary | ICD-10-CM

## 2018-11-21 NOTE — Progress Notes (Signed)
Subjective:    Patient ID: Donald Rios, male    DOB: 07-Aug-1940, 78 y.o.   MRN: [id number]  Patient presents for BP elevated (Pt checking BP 10-15 minutes after waking up) Patient here with concerns about his blood pressure.  States that he checks his blood pressure his heart rate and blood pressure up he typically checks it about 10 to 15 minutes after he wakes up before he has had his morning meds.  When he does take his medication his blood pressure comes down.  He states the first thing in the morning his top number may be in the 150s the bottom number is fine his heart rate may be 85-90.  After he eats breakfast and takes his meds and his blood pressure comes down to normal and his heart rate is in the 80s to 70s and it stays normal when he keeps checking it a couple times later into the evening.  He does not have any chest pain shortness of breath associated no dizziness no headache.  States that he was concerned that his blood pressure changes were related to him not having a good appetite which is why he was checking it so much.  He was seen by his PCP recently who added mirtazapine to help with his appetite at bedtime and also added Glucerna shakes.  He has been drinking 1 shake the typically drinks this before bedtime.  His 24-hour food recall he had a small bowl of cereal in the morning he had a handful of corn chips midday and he had a half a cup of chili and a beer and then he drank Ensure and coffee before bed.  He states that his appetite has not been right as well as his taste buds since he had dental work performed.  His dentures still do not fit and now he is considering getting implants.  He denies any pain with eating feels well otherwise with the exception of feeling fatigued when he is trying to do outdoor work.  Weight 161lbs  August  2019  , Weight 156 July 2020    Oct 152  , today 151  Review Of Systems:  GEN- denies fatigue, fever, weight loss,weakness, recent illness HEENT-  denies eye drainage, change in vision, nasal discharge, CVS- denies chest pain, palpitations RESP- denies SOB, cough, wheeze ABD- denies N/V, change in stools, abd pain GU- denies dysuria, hematuria, dribbling, incontinence MSK- denies joint pain, muscle aches, injury Neuro- denies headache, dizziness, syncope, seizure activity       Objective:    BP 130/64   Pulse 60   Temp (!) 97.5 F (36.4 C) (Oral)   Resp 16   Ht 5' 8.5" (1.74 m)   Wt 151 lb (68.5 kg)   SpO2 97%   BMI 22.63 kg/m  GEN- NAD, alert and oriented x3 HEENT- PERRL, EOMI, non injected sclera, pink conjunctiva, MMM, oropharynx clear Neck- Supple, no thyromegaly CVS- RRR, no murmur RESP-CTAB ABD-NABS,soft,NT,ND EXT- No edema Pulses- Radial 2+        Assessment & Plan:      Problem List Items Addressed This Visit      Unprioritized   Essential hypertension    Try to give patient reassurance today that his blood pressure was normal as well as his heart rate.  The can be arranged with the heart rate.  He is often checking his blood pressure before he is taking his morning meds at that point his beta-blocker has decrease in  his system therefore his heart rate is in the 80s.  He is not having any actual symptoms from this.  Advised him he also does not need to take his blood pressure multiple times a day.  No changes were made to his medications. Follow-up with his PCP in 4 weeks.  If he does become symptomatic or his heart rate does not improve after he takes his blood pressure medications then we can get his cardiologist involved.   His protein calorie malnutrition.  I think that this is multifactorial.  With his age his taste buds have changed he is having problems with his teeth due to his dentures not fitting and he is she is eating very small quantities.  Recommend that he increase his protein supplement to twice a day drinking 1 midday when he typically does not eat hardly anything.  Continue with the  mirtazapine another month and follow-up with his PCP for weight check.  His recent labs were fairly unremarkable with the exception of the low protein albumin      Protein-calorie malnutrition ([place])      Note: This dictation was prepared with Dragon dictation along with smaller phrase technology. Any transcriptional errors that result from this process are unintentional.

## 2018-11-21 NOTE — Patient Instructions (Signed)
Take remeron for another month Increase your protein shake to twice a day  Your blood pressure and heart rate are good, no change to the medications F/U 4 weeks with Dr. Dennard Schaumann to recheck Weight and blood pressure

## 2018-11-21 NOTE — Assessment & Plan Note (Signed)
Try to give patient reassurance today that his blood pressure was normal as well as his heart rate.  The can be arranged with the heart rate.  He is often checking his blood pressure before he is taking his morning meds at that point his beta-blocker has decrease in his system therefore his heart rate is in the 80s.  He is not having any actual symptoms from this.  Advised him he also does not need to take his blood pressure multiple times a day.  No changes were made to his medications. Follow-up with his PCP in 4 weeks.  If he does become symptomatic or his heart rate does not improve after he takes his blood pressure medications then we can get his cardiologist involved.   His protein calorie malnutrition.  I think that this is multifactorial.  With his age his taste buds have changed he is having problems with his teeth due to his dentures not fitting and he is she is eating very small quantities.  Recommend that he increase his protein supplement to twice a day drinking 1 midday when he typically does not eat hardly anything.  Continue with the mirtazapine another month and follow-up with his PCP for weight check.  His recent labs were fairly unremarkable with the exception of the low protein albumin

## 2018-11-24 ENCOUNTER — Other Ambulatory Visit: Payer: Self-pay

## 2018-11-24 ENCOUNTER — Ambulatory Visit
Admission: RE | Admit: 2018-11-24 | Discharge: 2018-11-24 | Disposition: A | Discharge status: Home / Self Care | Payer: Medicare Other | Source: Ambulatory Visit | Attending: Internal Medicine | Admitting: Internal Medicine

## 2018-11-24 ENCOUNTER — Encounter: Payer: Self-pay | Admitting: Internal Medicine

## 2018-11-24 ENCOUNTER — Ambulatory Visit (INDEPENDENT_AMBULATORY_CARE_PROVIDER_SITE_OTHER): Payer: Medicare Other | Admitting: Internal Medicine

## 2018-11-24 VITALS — BP 148/71 | HR 74 | Temp 97.0°F | Ht 68.5 in | Wt 151.0 lb

## 2018-11-24 DIAGNOSIS — Z951 Presence of aortocoronary bypass graft: Secondary | ICD-10-CM | POA: Diagnosis not present

## 2018-11-24 DIAGNOSIS — R634 Abnormal weight loss: Secondary | ICD-10-CM | POA: Diagnosis not present

## 2018-11-24 DIAGNOSIS — Z87891 Personal history of nicotine dependence: Secondary | ICD-10-CM

## 2018-11-24 DIAGNOSIS — I25812 Atherosclerosis of bypass graft of coronary artery of transplanted heart without angina pectoris: Secondary | ICD-10-CM

## 2018-11-24 DIAGNOSIS — E782 Mixed hyperlipidemia: Secondary | ICD-10-CM | POA: Diagnosis not present

## 2018-11-24 DIAGNOSIS — J449 Chronic obstructive pulmonary disease, unspecified: Secondary | ICD-10-CM | POA: Diagnosis not present

## 2018-11-24 DIAGNOSIS — I10 Essential (primary) hypertension: Secondary | ICD-10-CM | POA: Diagnosis not present

## 2018-11-24 NOTE — Patient Instructions (Signed)
Medication Instructions:  Your physician recommends that you continue on your current medications as directed. Please refer to the Current Medication list given to you today.  *If you need a refill on your cardiac medications before your next appointment, please call your pharmacy*  Lab Work: NONE If you have labs (blood work) drawn today and your tests are completely normal, you will receive your results only by: Marland Kitchen MyChart Message (if you have MyChart) OR . A paper copy in the mail If you have any lab test that is abnormal or we need to change your treatment, we will call you to review the results.  Testing/Procedures: CXR at [address] - this is a walk-in - no appointment needed  Follow-Up: At [facility], you and your health needs are our priority.  As part of our continuing mission to provide you with exceptional heart care, we have created designated Provider Care Teams.  These Care Teams include your primary Cardiologist (physician) and Advanced Practice Providers (APPs -  Physician Assistants and Nurse Practitioners) who all work together to provide you with the care you need, when you need it.  Your next appointment:   12 months  The format for your next appointment:   In Person  Provider:   You may see Dr. Debara Pickett or one of the following Advanced Practice Providers on your designated Care Team:    Almyra Deforest, PA-C  Fabian Sharp, [place] or   Roby Lofts, [place]   Other Instructions
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
## 2018-12-06 ENCOUNTER — Other Ambulatory Visit: Payer: Self-pay

## 2018-12-18 ENCOUNTER — Other Ambulatory Visit: Payer: Self-pay

## 2018-12-19 ENCOUNTER — Ambulatory Visit (INDEPENDENT_AMBULATORY_CARE_PROVIDER_SITE_OTHER): Payer: Medicare Other | Admitting: Family Medicine

## 2018-12-19 ENCOUNTER — Encounter: Payer: Self-pay | Admitting: Family Medicine

## 2018-12-19 VITALS — BP 118/62 | HR 60 | Temp 98.2°F | Resp 16 | Ht 68.5 in | Wt 152.0 lb

## 2018-12-19 DIAGNOSIS — E440 Moderate protein-calorie malnutrition: Secondary | ICD-10-CM

## 2018-12-19 DIAGNOSIS — I25812 Atherosclerosis of bypass graft of coronary artery of transplanted heart without angina pectoris: Secondary | ICD-10-CM

## 2018-12-19 NOTE — Progress Notes (Signed)
Subjective:    Patient ID: Donald Rios, male    DOB: 1940-06-17, 78 y.o.   MRN: [id number]  Medication Refill   Wt Readings from Last 3 Encounters:  12/19/18 152 lb (68.9 kg)  11/24/18 151 lb (68.5 kg)  11/21/18 151 lb (68.5 kg)    Patient is here today for recheck of his chronic medical problems.  However he reports poor appetite.  He also recently got dentures which make it hard for him to chew and as result he has not been eating well.  He has lost weight.  He believes that he is lost close to 10 pounds since early this spring.  He also reports rumbling bowel sounds.  He denies any nausea.  He denies any vomiting.  He denies any melena.  He denies any hematochezia.  He denies any GERD.  He denies any indigestion or stomach pain.  However he states that his stomach is always rumbling.  He denies any fevers or chills.  He denies any chest pain or shortness of breath or dyspnea on exertion.  At that time, my plan was: Patient's lab work is excellent.  His A1c is well controlled at 5.8.  His LDL cholesterol is well below 70.  His blood pressures well controlled today.  He received his flu shot.  I believe his weight loss is likely due to his poor appetite and inadequate calorie consumption.  Therefore I recommended adding an appetite stimulant such as Remeron 30 mg p.o. nightly and supplementing his diet with 1 can of Glucerna a day.  Meanwhile we will check a lipase as well as a sedimentation rate.   12/19/18 All of the patient's labs in October were normal including his sed rate.  Patient has not gained any weight since October despite taking Remeron however he has not lost any weight since October.  Therefore he is remained stable for more than 2 months.  He continues to attribute the fact that he is not eating well to his dentures not performing properly.  He is also had a change in his taste buds and therefore his desire and his appetite have been affected.  He is not taking a protein  supplement.  He denies any blood in his stool or melena.  He denies any abdominal pain.  Overall the patient feels comfortable with his weight now.  His prior concern was possible cancer causing weight loss Past Medical History:  Diagnosis Date  . Acute gastric ulcer with hemorrhage 05/06/2013  . Arthritis   . Asthma   . Atrial enlargement, left   . 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 Cataract   . COPD (chronic obstructive pulmonary disease) ([place])   . Diabetes mellitus   . Diabetic nephropathy ([place])   . Diverticulosis   . ED (erectile dysfunction)   . GERD (gastroesophageal reflux disease)   . Gout   . Hearing loss   . Hiatal hernia 05/2008   EGD with HH and reflux esophagitis.   Marland Kitchen History of MI (myocardial infarction)   . History of nuclear stress test 08/07/2010   dipyridamole; EKG negative for ischemia, low risk scan   . Hyperlipidemia   . Hyperplastic colon polyp 05/2008  . Hypertension   . Ischemic cardiomyopathy    EF 45%, with inferior wall motion abnormality   . Myocardial infarction ([place])   . Valvular regurgitation    mitral and tricuspid (mild)   Past Surgical History:  Procedure Laterality Date  .  CORONARY ARTERY BYPASS GRAFT  2000   LIMA to LAD, free IMA to OM2, sequential graft to PLA & PLD  . ESOPHAGOGASTRODUODENOSCOPY N/A 05/07/2013   Procedure: ESOPHAGOGASTRODUODENOSCOPY (EGD);  Surgeon: Gatha Mayer, MD;  Location[facility] ENDOSCOPY;  Service: Endoscopy;  Laterality: N/A;  . TRANSTHORACIC ECHOCARDIOGRAM  09/07/2010   EF Q000111Q; LV systolic function mildly reduced; LA mildly dilated; mild-mod MR & mild-mod TR; aortic root sclerosis/calcification;    Current Outpatient Medications on File Prior to Visit  Medication Sig Dispense Refill  . allopurinol (ZYLOPRIM) 300 MG tablet TAKE ONE TABLET BY MOUTH DAILY 90 tablet 1  . aspirin 81 MG tablet Take 81 mg by mouth daily.    . benazepril (LOTENSIN) 40 MG tablet TAKE ONE TABLET BY MOUTH DAILY 90 tablet 1  .  cloNIDine (CATAPRES) 0.2 MG tablet TAKE ONE TABLET BY MOUTH TWICE A DAY 180 tablet 2  . hydrochlorothiazide (HYDRODIURIL) 25 MG tablet TAKE ONE TABLET BY MOUTH DAILY 90 tablet 2  . metoprolol tartrate (LOPRESSOR) 50 MG tablet TAKE ONE AND ONE-HALF (1 & 1/2) TABLET BY MOUTH TWO TIMES A DAY 270 tablet 3  . mirtazapine (REMERON) 30 MG tablet Take 1 tablet (30 mg total) by mouth at bedtime. For appetite 30 tablet 5  . simvastatin (ZOCOR) 40 MG tablet TAKE ONE TABLET BY MOUTH DAILY 90 tablet 1   No current facility-administered medications on file prior to visit.    No Known Allergies Social History   Socioeconomic History  . Marital status: Married    Spouse name: Not on file  . Number of children: 1  . Years of education: Not on file  . Highest education level: Not on file  Occupational History  . Not on file  Social Needs  . Financial resource strain: Not on file  . Food insecurity    Worry: Not on file    Inability: Not on file  . Transportation needs    Medical: Not on file    Non-medical: Not on file  Tobacco Use  . Smoking status: Former Smoker    Quit date: 11/04/1985    Years since quitting: 33.1  . Smokeless tobacco: Never Used  Substance and Sexual Activity  . Alcohol use: Yes    Alcohol/week: 2.0 - 3.0 standard drinks    Types: 2 - 3 Cans of beer per week  . Drug use: No  . Sexual activity: Not on file  Lifestyle  . Physical activity    Days per week: Not on file    Minutes per session: Not on file  . Stress: Not on file  Relationships  . Social Herbalist on phone: Not on file    Gets together: Not on file    Attends religious service: Not on file    Active member of club or organization: Not on file    Attends meetings of clubs or organizations: Not on file    Relationship status: Not on file  . Intimate partner violence    Fear of current or ex partner: Not on file    Emotionally abused: Not on file    Physically abused: Not on file    Forced  sexual activity: Not on file  Other Topics Concern  . Not on file  Social History Narrative  . Not on file     Review of Systems  All other systems reviewed and are negative.      Objective:   Physical Exam  Constitutional: He  appears well-developed and well-nourished.  Neck: Neck supple. No JVD present. No thyromegaly present.  Cardiovascular: Normal rate, regular rhythm, normal heart sounds and intact distal pulses.  No murmur heard. Pulmonary/Chest: Effort normal and breath sounds normal. No respiratory distress. He has no wheezes. He has no rales. He exhibits no tenderness.  Abdominal: Soft. Bowel sounds are normal. He exhibits no distension and no mass. There is no abdominal tenderness. There is no rebound and no guarding.  Musculoskeletal:        General: No edema.  Lymphadenopathy:    He has no cervical adenopathy.  Skin: No rash noted.  Vitals reviewed.         Assessment & Plan:  Moderate protein-calorie malnutrition ([place]) - Plan: CBC with Differential, COMPLETE METABOLIC PANEL WITH GFR, Sedimentation Rate, TSH Patient does demonstrate protein calorie malnutrition and low total protein levels in the blood.  However I do not feel that he has a cancer fear.  I feel that his change in appetite and change in intake are the causes of this.  I have recommended taking Glucerna and mixing it with a milkshake 2-3 times a week so that he gets more calories and protein.  Otherwise he could use a protein/muscle milk supplement from [facility] store.  I will repeat his lab work to be thorough but I see no evidence today on his exam of any hidden underlying malignancy.  Did recommend that we discontinue Remeron as he is not gaining weight

## 2018-12-20 LAB — COMPLETE METABOLIC PANEL WITH GFR
AG Ratio: 2.1 (calc) (ref 1.0–2.5)
ALT: 15 U/L (ref 9–46)
AST: 17 U/L (ref 10–35)
Albumin: 3.6 g/dL (ref 3.6–5.1)
Alkaline phosphatase (APISO): 54 U/L (ref 35–144)
BUN/Creatinine Ratio: 17 (calc) (ref 6–22)
BUN: 20 mg/dL (ref 7–25)
CO2: 25 mmol/L (ref 20–32)
Calcium: 9.3 mg/dL (ref 8.6–10.3)
Chloride: 108 mmol/L (ref 98–110)
Creat: 1.2 mg/dL — ABNORMAL HIGH (ref 0.70–1.18)
GFR, Est African American: 67 mL/min/{1.73_m2} (ref >=60)
GFR, Est Non African American: 58 mL/min/{1.73_m2} — ABNORMAL LOW (ref >=60)
Globulin: 1.7 g/dL (calc) — ABNORMAL LOW (ref 1.9–3.7)
Glucose, Bld: 140 mg/dL — ABNORMAL HIGH (ref 65–99)
Potassium: 4.3 mmol/L (ref 3.5–5.3)
Sodium: 141 mmol/L (ref 135–146)
Total Bilirubin: 0.6 mg/dL (ref 0.2–1.2)
Total Protein: 5.3 g/dL — ABNORMAL LOW (ref 6.1–8.1)

## 2018-12-20 LAB — CBC WITH DIFFERENTIAL/PLATELET
Absolute Monocytes: 499 cells/uL (ref 200–950)
Basophils Absolute: 29 cells/uL (ref 0–200)
Basophils Relative: 0.5 %
Eosinophils Absolute: 394 cells/uL (ref 15–500)
Eosinophils Relative: 6.8 %
HCT: 35.4 % — ABNORMAL LOW (ref 38.5–50.0)
Hemoglobin: 12 g/dL — ABNORMAL LOW (ref 13.2–17.1)
Lymphs Abs: 1438 cells/uL (ref 850–3900)
MCH: 32.2 pg (ref 27.0–33.0)
MCHC: 33.9 g/dL (ref 32.0–36.0)
MCV: 94.9 fL (ref 80.0–100.0)
MPV: 12.4 fL (ref 7.5–12.5)
Monocytes Relative: 8.6 %
Neutro Abs: 3439 cells/uL (ref 1500–7800)
Neutrophils Relative %: 59.3 %
Platelets: 183 10*3/uL (ref 140–400)
RBC: 3.73 10*6/uL — ABNORMAL LOW (ref 4.20–5.80)
RDW: 13 % (ref 11.0–15.0)
Total Lymphocyte: 24.8 %
WBC: 5.8 10*3/uL (ref 3.8–10.8)

## 2018-12-20 LAB — TSH: TSH: 3.24 mIU/L (ref 0.40–4.50)

## 2018-12-20 LAB — SEDIMENTATION RATE: Sed Rate: 6 mm/h (ref 0–20)

## 2018-12-21 ENCOUNTER — Encounter: Payer: Self-pay | Admitting: *Deleted

## 2019-02-10 ENCOUNTER — Other Ambulatory Visit: Payer: Self-pay | Admitting: Family Medicine

## 2019-02-20 ENCOUNTER — Ambulatory Visit: Payer: Medicare Other | Attending: Internal Medicine

## 2019-02-20 DIAGNOSIS — Z23 Encounter for immunization: Secondary | ICD-10-CM

## 2019-02-20 NOTE — Progress Notes (Signed)
   Covid-19 Vaccination Clinic  Name:  Donald Rios    MRN: [id number] DOB: [birth year]  02/20/2019  [name] was observed post Covid-19 immunization for 15 minutes without incidence. He was provided with Vaccine Information Sheet and instruction to access the V-Safe system.   [name] was instructed to call 911 with any severe reactions post vaccine: Marland Kitchen Difficulty breathing  . Swelling of your face and throat  . A fast heartbeat  . A bad rash all over your body  . Dizziness and weakness    Immunizations Administered    Name Date Dose VIS Date Route   Pfizer COVID-19 Vaccine 02/20/2019  1:02 PM 0.3 mL 12/22/2018 Intramuscular   Manufacturer: [place]   Lot: [id number]   [place]: [id number]

## 2019-02-22 ENCOUNTER — Ambulatory Visit: Payer: Self-pay

## 2019-03-06 DIAGNOSIS — H524 Presbyopia: Secondary | ICD-10-CM | POA: Diagnosis not present

## 2019-03-06 DIAGNOSIS — E119 Type 2 diabetes mellitus without complications: Secondary | ICD-10-CM | POA: Diagnosis not present

## 2019-03-06 DIAGNOSIS — H2513 Age-related nuclear cataract, bilateral: Secondary | ICD-10-CM | POA: Diagnosis not present

## 2019-03-06 LAB — HM DIABETES EYE EXAM

## 2019-03-17 ENCOUNTER — Ambulatory Visit: Payer: Medicare Other | Attending: Family Medicine

## 2019-03-17 DIAGNOSIS — Z23 Encounter for immunization: Secondary | ICD-10-CM

## 2019-03-17 NOTE — Progress Notes (Signed)
   Covid-19 Vaccination Clinic  Name:  AMEEN CHICO    MRN: [id number] DOB: 06-Feb-1940  03/17/2019  Mr. Deorio was observed post Covid-19 immunization for 15 minutes without incident. He was provided with Vaccine Information Sheet and instruction to access the V-Safe system.   Mr. Patriarca was instructed to call 911 with any severe reactions post vaccine: Marland Kitchen Difficulty breathing  . Swelling of face and throat  . A fast heartbeat  . A bad rash all over body  . Dizziness and weakness   Immunizations Administered    Name Date Dose VIS Date Route   Pfizer COVID-19 Vaccine 03/17/2019 12:27 PM 0.3 mL 12/22/2018 Intramuscular   Manufacturer: [place]   Lot: [id number]   [place]: [id number]

## 2019-04-05 ENCOUNTER — Encounter: Payer: Self-pay | Admitting: *Deleted

## 2019-05-15 ENCOUNTER — Other Ambulatory Visit: Payer: Self-pay | Admitting: Family Medicine

## 2019-05-17 ENCOUNTER — Other Ambulatory Visit: Payer: Self-pay | Admitting: Family Medicine

## 2019-08-17 ENCOUNTER — Other Ambulatory Visit: Payer: Self-pay

## 2019-08-17 MED ORDER — SIMVASTATIN 40 MG PO TABS: 40.0000 mg | ORAL_TABLET | Freq: Every day | ORAL | 0 refills | Status: DC

## 2019-08-17 MED ORDER — CLONIDINE HCL 0.2 MG PO TABS: 0.2000 mg | ORAL_TABLET | Freq: Two times a day (BID) | ORAL | 0 refills | Status: DC

## 2019-08-17 MED ORDER — HYDROCHLOROTHIAZIDE 25 MG PO TABS: 25.0000 mg | ORAL_TABLET | Freq: Every day | ORAL | 0 refills | Status: DC

## 2019-08-17 MED ORDER — METOPROLOL TARTRATE 50 MG PO TABS: ORAL_TABLET | ORAL | 0 refills | Status: DC

## 2019-08-23 ENCOUNTER — Other Ambulatory Visit: Payer: Self-pay | Admitting: Family Medicine

## 2019-08-23 MED ORDER — CLONIDINE HCL 0.2 MG PO TABS: 0.2000 mg | ORAL_TABLET | Freq: Two times a day (BID) | ORAL | 0 refills | Status: DC

## 2019-08-23 MED ORDER — BENAZEPRIL HCL 40 MG PO TABS: 40.0000 mg | ORAL_TABLET | Freq: Every day | ORAL | 0 refills | Status: DC

## 2019-08-23 NOTE — Telephone Encounter (Signed)
Medication filled x1 with no refills.   Requires office visit before any further refills can be given.  

## 2019-08-23 NOTE — Telephone Encounter (Signed)
Refill Clonidine, Benazepril  [facility], [place] [facility] [place]

## 2019-09-11 ENCOUNTER — Ambulatory Visit (INDEPENDENT_AMBULATORY_CARE_PROVIDER_SITE_OTHER): Payer: Medicare Other | Admitting: Family Medicine

## 2019-09-11 ENCOUNTER — Other Ambulatory Visit: Payer: Self-pay

## 2019-09-11 VITALS — BP 130/78 | HR 63 | Temp 97.0°F | Ht 68.0 in | Wt 155.0 lb

## 2019-09-11 DIAGNOSIS — R0600 Dyspnea, unspecified: Secondary | ICD-10-CM

## 2019-09-11 DIAGNOSIS — Z8679 Personal history of other diseases of the circulatory system: Secondary | ICD-10-CM | POA: Diagnosis not present

## 2019-09-11 DIAGNOSIS — R0609 Other forms of dyspnea: Secondary | ICD-10-CM

## 2019-09-11 DIAGNOSIS — E119 Type 2 diabetes mellitus without complications: Secondary | ICD-10-CM

## 2019-09-11 DIAGNOSIS — E782 Mixed hyperlipidemia: Secondary | ICD-10-CM | POA: Diagnosis not present

## 2019-09-11 DIAGNOSIS — I10 Essential (primary) hypertension: Secondary | ICD-10-CM

## 2019-09-11 NOTE — Progress Notes (Signed)
Subjective:    Patient ID: Donald Rios, male    DOB: April 06, 1940, 79 y.o.   MRN: [id number]  Patient is a very pleasant 79 year old Caucasian male here today for a checkup.  Of note, he has considerable swelling in his legs.  He has +2 pitting edema in both feet and +1 pitting edema in both legs up to his knees.  He denies any chest pain.  He does have some mild shortness of breath with activity.  He denies any orthopnea or paroxysmal nocturnal dyspnea.  He has a history of a Myoview performed last year that showed an ejection fraction of 51% and no evidence of ischemia.  He denies any change in his functional status however the swelling is quite pronounced today on exam.  He denies any increased salt intake.  He denies any polyuria, polydipsia, blurry vision.  Blood pressure today is well controlled.  Lungs are completely clear to auscultation bilaterally.  He denies any neuropathy in his feet.  He denies any myalgias or right upper quadrant pain.  He denies any angina. Past Medical History:  Diagnosis Date  . Acute gastric ulcer with hemorrhage 05/06/2013  . Arthritis   . Asthma   . Atrial enlargement, left   . 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 Cataract   . COPD (chronic obstructive pulmonary disease) ([place])   . Diabetes mellitus   . Diabetic nephropathy ([place])   . Diverticulosis   . ED (erectile dysfunction)   . GERD (gastroesophageal reflux disease)   . Gout   . Hearing loss   . Hiatal hernia 05/2008   EGD with HH and reflux esophagitis.   Marland Kitchen History of MI (myocardial infarction)   . History of nuclear stress test 08/07/2010   dipyridamole; EKG negative for ischemia, low risk scan   . Hyperlipidemia   . Hyperplastic colon polyp 05/2008  . Hypertension   . Ischemic cardiomyopathy    EF 45%, with inferior wall motion abnormality   . Myocardial infarction ([place])   . Valvular regurgitation    mitral and tricuspid (mild)   Past Surgical History:  Procedure Laterality  Date  . CORONARY ARTERY BYPASS GRAFT  2000   LIMA to LAD, free IMA to OM2, sequential graft to PLA & PLD  . ESOPHAGOGASTRODUODENOSCOPY N/A 05/07/2013   Procedure: ESOPHAGOGASTRODUODENOSCOPY (EGD);  Surgeon: Gatha Mayer, MD;  Location: [facility] ENDOSCOPY;  Service: Endoscopy;  Laterality: N/A;  . TRANSTHORACIC ECHOCARDIOGRAM  09/07/2010   EF 95-28%; LV systolic function mildly reduced; LA mildly dilated; mild-mod MR & mild-mod TR; aortic root sclerosis/calcification;    Current Outpatient Medications on File Prior to Visit  Medication Sig Dispense Refill  . allopurinol (ZYLOPRIM) 300 MG tablet TAKE ONE TABLET BY MOUTH DAILY 90 tablet 1  . aspirin 81 MG tablet Take 81 mg by mouth daily.    . benazepril (LOTENSIN) 40 MG tablet Take 1 tablet (40 mg total) by mouth daily. 30 tablet 0  . cloNIDine (CATAPRES) 0.2 MG tablet Take 1 tablet (0.2 mg total) by mouth 2 (two) times daily. 30 tablet 0  . hydrochlorothiazide (HYDRODIURIL) 25 MG tablet Take 1 tablet (25 mg total) by mouth daily. 90 tablet 0  . metoprolol tartrate (LOPRESSOR) 50 MG tablet TAKE ONE AND ONE-HALF (1 & 1/2) TABLET BY MOUTH TWO TIMES A DAY 270 tablet 0  . mirtazapine (REMERON) 30 MG tablet TAKE ONE TABLET BY MOUTH AT BEDTIME FOR APPETITE 30 tablet 4  . simvastatin (ZOCOR)  40 MG tablet Take 1 tablet (40 mg total) by mouth daily. 90 tablet 0   No current facility-administered medications on file prior to visit.   No Known Allergies Social History   Socioeconomic History  . Marital status: Married    Spouse name: Not on file  . Number of children: 1  . Years of education: Not on file  . Highest education level: Not on file  Occupational History  . Not on file  Tobacco Use  . Smoking status: Former Smoker    Quit date: 11/04/1985    Years since quitting: 33.8  . Smokeless tobacco: Never Used  Substance and Sexual Activity  . Alcohol use: Yes    Alcohol/week: 2.0 - 3.0 standard drinks    Types: 2 - 3 Cans of beer per week  .  Drug use: No  . Sexual activity: Not on file  Other Topics Concern  . Not on file  Social History Narrative  . Not on file   Social Determinants of Health   Financial Resource Strain:   . Difficulty of Paying Living Expenses: Not on file  Food Insecurity:   . Worried About Charity fundraiser in the Last Year: Not on file  . Ran Out of Food in the Last Year: Not on file  Transportation Needs:   . Lack of Transportation (Medical): Not on file  . Lack of Transportation (Non-Medical): Not on file  Physical Activity:   . Days of Exercise per Week: Not on file  . Minutes of Exercise per Session: Not on file  Stress:   . Feeling of Stress : Not on file  Social Connections:   . Frequency of Communication with Friends and Family: Not on file  . Frequency of Social Gatherings with Friends and Family: Not on file  . Attends Religious Services: Not on file  . Active Member of Clubs or Organizations: Not on file  . Attends Archivist Meetings: Not on file  . Marital Status: Not on file  Intimate Partner Violence:   . Fear of Current or Ex-Partner: Not on file  . Emotionally Abused: Not on file  . Physically Abused: Not on file  . Sexually Abused: Not on file     Review of Systems  All other systems reviewed and are negative.      Objective:   Physical Exam Vitals reviewed.  Constitutional:      Appearance: He is well-developed.  Neck:     Thyroid: No thyromegaly.     Vascular: No JVD.  Cardiovascular:     Rate and Rhythm: Normal rate and regular rhythm.     Heart sounds: Normal heart sounds. No murmur heard.   Pulmonary:     Effort: Pulmonary effort is normal. No respiratory distress.     Breath sounds: Normal breath sounds. No wheezing or rales.  Chest:     Chest wall: No tenderness.  Abdominal:     General: Bowel sounds are normal. There is no distension.     Palpations: Abdomen is soft. There is no mass.     Tenderness: There is no abdominal tenderness.  There is no guarding or rebound.  Musculoskeletal:     Cervical back: Neck supple.     Right lower leg: Edema present.     Left lower leg: Edema present.  Lymphadenopathy:     Cervical: No cervical adenopathy.  Skin:    Findings: No rash.  Assessment & Plan:  Type 2 diabetes mellitus without complication, without long-term current use of insulin (HCC) - Plan: Hemoglobin A1c, CBC with Differential/Platelet, COMPLETE METABOLIC PANEL WITH GFR, Lipid panel  History of ASCVD - Plan: ECHOCARDIOGRAM COMPLETE  Mixed hyperlipidemia  Essential hypertension  Dyspnea on exertion - Plan: ECHOCARDIOGRAM COMPLETE Given his history of coronary artery disease, and the significant swelling on exam, I have recommended an echocardiogram to evaluate for any evidence of congestive heart failure.  I will also check a CBC, CMP, fasting lipid panel.  Goal hemoglobin A1c is less than 6.5.  Goal LDL cholesterol is less than 70.  Monitor for any kidney or liver dysfunction.  Blood pressure today is excellent.  If echocardiogram is normal and if lab work is normal I would recommend weaning off the mirtazapine and switching his hydrochlorothiazide to Lasix.

## 2019-09-13 ENCOUNTER — Other Ambulatory Visit: Payer: Self-pay

## 2019-09-13 ENCOUNTER — Other Ambulatory Visit: Payer: Medicare Other

## 2019-09-13 ENCOUNTER — Telehealth: Payer: Self-pay | Admitting: Family Medicine

## 2019-09-13 DIAGNOSIS — E119 Type 2 diabetes mellitus without complications: Secondary | ICD-10-CM | POA: Diagnosis not present

## 2019-09-13 MED ORDER — BENAZEPRIL HCL 40 MG PO TABS: 40.0000 mg | ORAL_TABLET | Freq: Every day | ORAL | 3 refills | Status: DC

## 2019-09-13 NOTE — Progress Notes (Signed)
  Chronic Care Management   Note  09/13/2019 Name: KAELON WEEKES MRN: [id number] DOB: July 14, 1940  LYDON VANSICKLE is a 79 y.o. year old male who is a primary care patient of Susy Frizzle, MD. I reached out to [name] by phone today in response to a referral sent by Mr. Izaan Kingbird Evette Cristal PCP, Susy Frizzle, MD.   Mr. Milstein was given information about Chronic Care Management services today including:  1. CCM service includes personalized support from designated clinical staff supervised by his physician, including individualized plan of care and coordination with other care providers 2. 24/7 contact phone numbers for assistance for urgent and routine care needs. 3. Service will only be billed when office clinical staff spend 20 minutes or more in a month to coordinate care. 4. Only one practitioner may furnish and bill the service in a calendar month. 5. The patient may stop CCM services at any time (effective at the end of the month) by phone call to the office staff.   Patient agreed to services and verbal consent obtained.   Follow up plan:   Carley Perdue [employer] Scheduler

## 2019-09-14 LAB — LIPID PANEL
Cholesterol: 114 mg/dL (ref <200)
HDL: 58 mg/dL (ref >=40)
LDL Cholesterol (Calc): 44 mg/dL (calc)
Non-HDL Cholesterol (Calc): 56 mg/dL (calc) (ref <130)
Total CHOL/HDL Ratio: 2 (calc) (ref <5.0)
Triglycerides: 41 mg/dL (ref <150)

## 2019-09-14 LAB — CBC WITH DIFFERENTIAL/PLATELET
Absolute Monocytes: 390 cells/uL (ref 200–950)
Basophils Absolute: 30 cells/uL (ref 0–200)
Basophils Relative: 0.6 %
Eosinophils Absolute: 260 cells/uL (ref 15–500)
Eosinophils Relative: 5.2 %
HCT: 35.4 % — ABNORMAL LOW (ref 38.5–50.0)
Hemoglobin: 11.7 g/dL — ABNORMAL LOW (ref 13.2–17.1)
Lymphs Abs: 1650 cells/uL (ref 850–3900)
MCH: 31.4 pg (ref 27.0–33.0)
MCHC: 33.1 g/dL (ref 32.0–36.0)
MCV: 94.9 fL (ref 80.0–100.0)
MPV: 12.4 fL (ref 7.5–12.5)
Monocytes Relative: 7.8 %
Neutro Abs: 2670 cells/uL (ref 1500–7800)
Neutrophils Relative %: 53.4 %
Platelets: 154 10*3/uL (ref 140–400)
RBC: 3.73 10*6/uL — ABNORMAL LOW (ref 4.20–5.80)
RDW: 13.6 % (ref 11.0–15.0)
Total Lymphocyte: 33 %
WBC: 5 10*3/uL (ref 3.8–10.8)

## 2019-09-14 LAB — COMPLETE METABOLIC PANEL WITH GFR
AG Ratio: 2.2 (calc) (ref 1.0–2.5)
ALT: 10 U/L (ref 9–46)
AST: 15 U/L (ref 10–35)
Albumin: 4 g/dL (ref 3.6–5.1)
Alkaline phosphatase (APISO): 54 U/L (ref 35–144)
BUN: 15 mg/dL (ref 7–25)
CO2: 28 mmol/L (ref 20–32)
Calcium: 9.2 mg/dL (ref 8.6–10.3)
Chloride: 105 mmol/L (ref 98–110)
Creat: 1.13 mg/dL (ref 0.70–1.18)
GFR, Est African American: 72 mL/min/{1.73_m2} (ref >=60)
GFR, Est Non African American: 62 mL/min/{1.73_m2} (ref >=60)
Globulin: 1.8 g/dL (calc) — ABNORMAL LOW (ref 1.9–3.7)
Glucose, Bld: 116 mg/dL — ABNORMAL HIGH (ref 65–99)
Potassium: 4.6 mmol/L (ref 3.5–5.3)
Sodium: 139 mmol/L (ref 135–146)
Total Bilirubin: 1 mg/dL (ref 0.2–1.2)
Total Protein: 5.8 g/dL — ABNORMAL LOW (ref 6.1–8.1)

## 2019-09-14 LAB — HEMOGLOBIN A1C
Hgb A1c MFr Bld: 5.6 % of total Hgb (ref <5.7)
Mean Plasma Glucose: 114 (calc)
eAG (mmol/L): 6.3 (calc)

## 2019-10-02 ENCOUNTER — Other Ambulatory Visit: Payer: Self-pay

## 2019-10-02 ENCOUNTER — Ambulatory Visit (HOSPITAL_COMMUNITY): Payer: Medicare Other | Attending: Family Medicine

## 2019-10-02 DIAGNOSIS — R0600 Dyspnea, unspecified: Secondary | ICD-10-CM

## 2019-10-02 DIAGNOSIS — Z8679 Personal history of other diseases of the circulatory system: Secondary | ICD-10-CM | POA: Diagnosis not present

## 2019-10-02 DIAGNOSIS — R0609 Other forms of dyspnea: Secondary | ICD-10-CM

## 2019-10-02 LAB — ECHOCARDIOGRAM COMPLETE
Area-P 1/2: 3.48 cm2
MV M vel: 6.02 m/s
MV Peak grad: 144.7 mmHg
S' Lateral: 3.3 cm

## 2019-10-09 DIAGNOSIS — L814 Other melanin hyperpigmentation: Secondary | ICD-10-CM | POA: Diagnosis not present

## 2019-10-09 DIAGNOSIS — D0359 Melanoma in situ of other part of trunk: Secondary | ICD-10-CM | POA: Diagnosis not present

## 2019-10-09 DIAGNOSIS — L304 Erythema intertrigo: Secondary | ICD-10-CM | POA: Diagnosis not present

## 2019-10-09 DIAGNOSIS — R238 Other skin changes: Secondary | ICD-10-CM | POA: Diagnosis not present

## 2019-10-09 DIAGNOSIS — L821 Other seborrheic keratosis: Secondary | ICD-10-CM | POA: Diagnosis not present

## 2019-10-09 DIAGNOSIS — D1801 Hemangioma of skin and subcutaneous tissue: Secondary | ICD-10-CM | POA: Diagnosis not present

## 2019-10-09 DIAGNOSIS — L570 Actinic keratosis: Secondary | ICD-10-CM | POA: Diagnosis not present

## 2019-10-09 DIAGNOSIS — D485 Neoplasm of uncertain behavior of skin: Secondary | ICD-10-CM | POA: Diagnosis not present

## 2019-10-09 DIAGNOSIS — D225 Melanocytic nevi of trunk: Secondary | ICD-10-CM | POA: Diagnosis not present

## 2019-10-18 DIAGNOSIS — L989 Disorder of the skin and subcutaneous tissue, unspecified: Secondary | ICD-10-CM | POA: Diagnosis not present

## 2019-10-18 DIAGNOSIS — C4359 Malignant melanoma of other part of trunk: Secondary | ICD-10-CM | POA: Diagnosis not present

## 2019-10-24 ENCOUNTER — Other Ambulatory Visit: Payer: Self-pay | Admitting: *Deleted

## 2019-10-24 DIAGNOSIS — E440 Moderate protein-calorie malnutrition: Secondary | ICD-10-CM

## 2019-10-24 DIAGNOSIS — E119 Type 2 diabetes mellitus without complications: Secondary | ICD-10-CM

## 2019-10-24 DIAGNOSIS — I10 Essential (primary) hypertension: Secondary | ICD-10-CM

## 2019-10-24 DIAGNOSIS — Z8679 Personal history of other diseases of the circulatory system: Secondary | ICD-10-CM

## 2019-10-24 DIAGNOSIS — E782 Mixed hyperlipidemia: Secondary | ICD-10-CM

## 2019-10-24 NOTE — Chronic Care Management (AMB) (Addendum)
Chronic Care Management Pharmacy  Name: Donald Rios  MRN: [id number] DOB: 09/04/1940  Chief Complaint/ HPI  Donald Rios,  79 y.o. , male presents for their Initial CCM visit with the clinical pharmacist In office.  PCP : Susy Frizzle, MD  Their chronic conditions include: CAD, HTN, HF, Type II DM, HLD, Gout.  Office Visits: 09/11/2019 ([place]) - Checkup Presents with considerable swelling in both legs, denies chest pain, last EF was 51%, his lungs are completely clear and states it has not effected his activity.  ECG ordered to check for congestive HF, goal A1c < 6.5 and LDL < 70.  If ECG was normal plans were to switch HCTZ to Lasix and wean off of the mirtazapine.  ECG came back relatively normal with exception of hypertrophy, he was switched from HCTZ to Lasix 82m daily.  Consult Visit: none recent  Medications: Outpatient Encounter Medications as of 10/25/2019  Medication Sig   allopurinol (ZYLOPRIM) 300 MG tablet TAKE ONE TABLET BY MOUTH DAILY   aspirin 81 MG tablet Take 81 mg by mouth daily.   benazepril (LOTENSIN) 40 MG tablet Take 1 tablet (40 mg total) by mouth daily.   cloNIDine (CATAPRES) 0.2 MG tablet Take 1 tablet (0.2 mg total) by mouth 2 (two) times daily.   hydrochlorothiazide (HYDRODIURIL) 25 MG tablet Take 1 tablet (25 mg total) by mouth daily.   metoprolol tartrate (LOPRESSOR) 50 MG tablet TAKE ONE AND ONE-HALF (1 & 1/2) TABLET BY MOUTH TWO TIMES A DAY   mirtazapine (REMERON) 30 MG tablet TAKE ONE TABLET BY MOUTH AT BEDTIME FOR APPETITE   simvastatin (ZOCOR) 40 MG tablet Take 1 tablet (40 mg total) by mouth daily.   No facility-administered encounter medications on file as of 10/25/2019.     Current Diagnosis/Assessment:  Goals Addressed             This Visit's Progress    Pharmacy Care Plan:       CARE PLAN ENTRY (see longitudinal plan of care for additional care plan information)  Current Barriers:  Chronic Disease Management  support, education, and care coordination needs related to Hypertension, Hyperlipidemia, Diabetes, and Heart Failure   Hypertension BP Readings from Last 3 Encounters:  10/25/19 (!) 150/82  09/11/19 130/78  12/19/18 118/62   Pharmacist Clinical Goal(s): Over the next 180 days, patient will work with PharmD and providers to maintain BP goal <130/80 Current regimen:  Benazepril 4105mdaily Clonidine 0.2m47mwice daily Metoprolol tartrate 73m28me and one-half tablet po bid HCTZ 25mg83merventions: Recommended continued home monitoring Comprehensive medication review Patient self care activities - Over the next 180 days, patient will: Check BP daily, document, and provide at future appointments Ensure daily salt intake < 2300 mg/day  Hyperlipidemia Lab Results  Component Value Date/Time   LDLCALC 44 09/13/2019 09:07 AM   LDLCALC 58 08/17/2012 08:13 AM   Pharmacist Clinical Goal(s): Over the next 180 days, patient will work with PharmD and providers to maintain LDL goal < 70 Current regimen:  Simvastatin 40mg 59mrventions: Reviewed most recent lipid panel Patient self care activities - Over the next 180 days, patient will: Continue to focus on medication adherence by pill count  Diabetes Lab Results  Component Value Date/Time   HGBA1C 5.6 09/13/2019 09:07 AM   HGBA1C 5.8 (H) 10/13/2018 08:41 AM   Pharmacist Clinical Goal(s): Over the next 180 days, patient will work with PharmD and providers to maintain A1c goal <6.5% Current regimen:  No medications  Interventions: Discussed limiting carbohydrates and sweets from diet Counseled on s/sx of hyperglycemia Patient self care activities - Over the next 180 days, patient will: Check blood sugar as needed, document, and provide at future appointments Contact provider with any episodes of hypoglycemia  HF Pharmacist Clinical Goal(s) Over the next 180 days, patient will work with PharmD and providers to optimize medication  and minimize symptoms of HF. Current regimen:  Metoprolol tartrate 84m one-half tablet by mouth twice daily Interventions: Discussed swelling in lower extremities Comprehensive medication review Mentioned Dr. PSamella Parrrecommendation of Lasix instead of HCTZ Patient self care activities - Over the next 180 days, patient will: Continue medication as directed Follow up with PCP next week to discuss medication changes  Initial goal documentation         Heart Failure   Type: Systolic  Last ejection fraction: 51% NYHA Class: I (no actitivty limitation)  Patient has failed these meds in past: none noted Patient is currently controlled on the following medications:  Metoprolol tartrate 557mone and one-half tablet po bid  We discussed He never started the Lasix - reports minimal swelling in his legs, he feels this has gotten better Very active doing yard work and various other activities. Encouraged him to contact providers if swelling returns or worsens.  Plan  Continue current medications  Hypertension   BP goal is:  <130/80  Office blood pressures are  BP Readings from Last 3 Encounters:  10/25/19 (!) 150/82  09/11/19 130/78  12/19/18 118/62   Patient checks BP at home daily Patient home BP readings are ranging: no logs, he reports "normal"  Patient has failed these meds in the past: none noted Patient is currently controlled on the following medications:  Benazepril 4072maily Clonidine 0.2mg5mice daily Metoprolol tartrate 50mg67m and one-half tablet po bid HCTZ 25mg 7mdiscussed He checks his BP daily Denies dizziness, headaches Reports 100% adherence  Plan  Continue current medications     Hyperlipidemia   LDL goal < 70  Lipid Panel     Component Value Date/Time   CHOL 114 09/13/2019 0907   CHOL 128 08/17/2012 0813   TRIG 41 09/13/2019 0907   TRIG 69 08/17/2012 0813   HDL 58 09/13/2019 0907   HDL 56 08/17/2012 0813   LDLCALC 44  09/13/2019 0907   LDLCALC 58 08/17/2012 0813    Hepatic Function Latest Ref Rng & Units 09/13/2019 12/19/2018 10/13/2018  Total Protein 6.1 - 8.1 g/dL 5.8(L) 5.3(L) 5.7(L)  Albumin 3.6 - 5.1 g/dL - - -  AST 10 - 35 U/L 15 17 13   ALT 9 - 46 U/L 10 15 9   Alk Phosphatase 40 - 115 U/L - - -  Total Bilirubin 0.2 - 1.2 mg/dL 1.0 0.6 0.9  Bilirubin, Direct 0.0 - 0.2 mg/dL - - -     The ASCVD Risk score (Goff Mikey Bussing., et al., 2013) failed to calculate for the following reasons:   The valid total cholesterol range is 130 to 320 mg/dL   Patient has failed these meds in past: none noted Patient is currently controlled on the following medications:  Simvastatin 40mg  33miscussed:  LDL is within normal limits, denies myalgias  Plan  Continue current medications Diabetes   A1c goal <6.5%  Recent Relevant Labs: Lab Results  Component Value Date/Time   HGBA1C 5.6 09/13/2019 09:07 AM   HGBA1C 5.8 (H) 10/13/2018 08:41 AM   MICROALBUR 0.4 04/08/2017 08:23 AM   MICROALBUR 0.3  04/05/2016 08:18 AM    Last diabetic Eye exam:  Lab Results  Component Value Date/Time   HMDIABEYEEXA No Retinopathy 03/06/2019 12:00 AM    Last diabetic Foot exam: No results found for: HMDIABFOOTEX   Checking BG: Never  Patient has failed these meds in past: none noted Patient is currently controlled on the following medications: No medications at this time  We discussed:  He is watching what he eats as far as carbohydrates and sweets A1c was well controlled in September Denies symptoms of hyperglycemia  Plan  Continue current medications Gout   Patient has failed these meds in past: none noted Patient is currently controlled on the following medications:  Allopurinol 368m  We discussed:  He takes medication as directed Has not had any gout flares recently  Plan  Continue current medications CAD   Patient has failed these meds in past: none noted Patient is currently controlled on the  following medications:  ASA 8105m We discussed:  Controlling risk factors Denies abnormal bleeding or bruising  Plan  Continue current medications  Vaccines   Reviewed and discussed patient's vaccination history.    Immunization History  Administered Date(s) Administered   Fluad Quad(high Dose 65+) 10/17/2018   Influenza Whole 11/22/2002, 11/07/2003, 10/06/2009   Influenza, High Dose Seasonal PF 10/17/2017   Influenza,inj,Quad PF,6+ Mos 11/02/2012, 10/01/2013, 10/04/2014, 10/03/2015, 10/11/2016   PFIZER SARS-COV-2 Vaccination 02/20/2019, 03/17/2019   Pneumococcal Conjugate-13 01/26/2013   Pneumococcal Polysaccharide-23 10/27/1998, 03/31/2015   Td 03/27/2009   Tdap 05/24/2011   Zoster 01/03/2012    Plan  Recommended patient receive Shingrix vaccine in pharmacy.  Medication Management   Miscellaneous medications: Mirtazapine 3055maily OTC's: ASA 49m60mtient currently uses [facility] #  [phone] reports no specific method to organize medications and promote adherence. Patient denies missed doses of medication.   ChriBeverly MilcharmD Clinical Pharmacist [phone] collaborated with the care management provider regarding care management and care coordination activities outlined in this encounter and have reviewed this encounter including documentation in the note and care plan. I am certifying that I agree with the content of this note and encounter as supervising physician.

## 2019-10-25 ENCOUNTER — Ambulatory Visit: Payer: Medicare Other | Admitting: Pharmacist

## 2019-10-25 ENCOUNTER — Other Ambulatory Visit: Payer: Self-pay

## 2019-10-25 VITALS — BP 150/82 | HR 68 | Temp 98.4°F | Ht 68.0 in | Wt 212.0 lb

## 2019-10-25 DIAGNOSIS — E119 Type 2 diabetes mellitus without complications: Secondary | ICD-10-CM

## 2019-10-25 DIAGNOSIS — I5022 Chronic systolic (congestive) heart failure: Secondary | ICD-10-CM

## 2019-10-25 DIAGNOSIS — I10 Essential (primary) hypertension: Secondary | ICD-10-CM

## 2019-10-25 DIAGNOSIS — E782 Mixed hyperlipidemia: Secondary | ICD-10-CM

## 2019-10-25 NOTE — Patient Instructions (Addendum)
Visit Information Thank you for meeting with me today!  I look forward to working with you to help you meet all of your healthcare goals and answer any questions you may have.  Feel free to contact me anytime!  Goals Addressed            This Visit's Progress   . Pharmacy Care Plan:       CARE PLAN ENTRY (see longitudinal plan of care for additional care plan information)  Current Barriers:  . Chronic Disease Management support, education, and care coordination needs related to Hypertension, Hyperlipidemia, Diabetes, and Heart Failure   Hypertension BP Readings from Last 3 Encounters:  10/25/19 (!) 150/82  09/11/19 130/78  12/19/18 118/62   . Pharmacist Clinical Goal(s): o Over the next 180 days, patient will work with PharmD and providers to maintain BP goal <130/80 . Current regimen:  . Benazepril 40mg  daily . Clonidine 0.2mg  twice daily . Metoprolol tartrate 50mg  one and one-half tablet po bid . HCTZ 25mg  . Interventions: o Recommended continued home monitoring o Comprehensive medication review . Patient self care activities - Over the next 180 days, patient will: o Check BP daily, document, and provide at future appointments o Ensure daily salt intake < 2300 mg/day  Hyperlipidemia Lab Results  Component Value Date/Time   LDLCALC 44 09/13/2019 09:07 AM   LDLCALC 58 08/17/2012 08:13 AM   . Pharmacist Clinical Goal(s): o Over the next 180 days, patient will work with PharmD and providers to maintain LDL goal < 70 . Current regimen:  o Simvastatin 40mg  . Interventions: o Reviewed most recent lipid panel . Patient self care activities - Over the next 180 days, patient will: o Continue to focus on medication adherence by pill count  Diabetes Lab Results  Component Value Date/Time   HGBA1C 5.6 09/13/2019 09:07 AM   HGBA1C 5.8 (H) 10/13/2018 08:41 AM   . Pharmacist Clinical Goal(s): o Over the next 180 days, patient will work with PharmD and providers to maintain  A1c goal <6.5% . Current regimen:  o No medications . Interventions: o Discussed limiting carbohydrates and sweets from diet o Counseled on s/sx of hyperglycemia . Patient self care activities - Over the next 180 days, patient will: o Check blood sugar as needed, document, and provide at future appointments o Contact provider with any episodes of hypoglycemia  HF . Pharmacist Clinical Goal(s) o Over the next 180 days, patient will work with PharmD and providers to optimize medication and minimize symptoms of HF. . Current regimen:  o Metoprolol tartrate 50mg  one-half tablet by mouth twice daily . Interventions: o Discussed swelling in lower extremities o Comprehensive medication review o Mentioned Dr. Samella Parr recommendation of Lasix instead of HCTZ . Patient self care activities - Over the next 180 days, patient will: o Continue medication as directed o Follow up with PCP next week to discuss medication changes  Initial goal documentation        Donald Rios was given information about Chronic Care Management services today including:  1. CCM service includes personalized support from designated clinical staff supervised by his physician, including individualized plan of care and coordination with other care providers 2. 24/7 contact phone numbers for assistance for urgent and routine care needs. 3. Standard insurance, coinsurance, copays and deductibles apply for chronic care management only during months in which we provide at least 20 minutes of these services. Most insurances cover these services at 100%, however patients may be responsible for any copay, coinsurance  and/or deductible if applicable. This service may help you avoid the need for more expensive face-to-face services. 4. Only one practitioner may furnish and bill the service in a calendar month. 5. The patient may stop CCM services at any time (effective at the end of the month) by phone call to the office  staff.  Patient agreed to services and verbal consent obtained.   The patient verbalized understanding of instructions provided today and agreed to receive a mailed copy of patient instruction and/or educational materials. Telephone follow up appointment with pharmacy team member scheduled for:  46 months  Arin Peral, PharmD Clinical Pharmacist [facility] [phone]   Diabetes Mellitus and Nutrition, Adult When you have diabetes (diabetes mellitus), it is very important to have healthy eating habits because your blood sugar (glucose) levels are greatly affected by what you eat and drink. Eating healthy foods in the appropriate amounts, at about the same times every day, can help you:  Control your blood glucose.  Lower your risk of heart disease.  Improve your blood pressure.  Reach or maintain a healthy weight. Every person with diabetes is different, and each person has different needs for a meal plan. Your health care provider may recommend that you work with a diet and nutrition specialist (dietitian) to make a meal plan that is best for you. Your meal plan may vary depending on factors such as:  The calories you need.  The medicines you take.  Your weight.  Your blood glucose, blood pressure, and cholesterol levels.  Your activity level.  Other health conditions you have, such as heart or kidney disease. How do carbohydrates affect me? Carbohydrates, also called carbs, affect your blood glucose level more than any other type of food. Eating carbs naturally raises the amount of glucose in your blood. Carb counting is a method for keeping track of how many carbs you eat. Counting carbs is important to keep your blood glucose at a healthy level, especially if you use insulin or take certain oral diabetes medicines. It is important to know how many carbs you can safely have in each meal. This is different for every person. Your dietitian can help you  calculate how many carbs you should have at each meal and for each snack. Foods that contain carbs include:  Bread, cereal, rice, pasta, and crackers.  Potatoes and corn.  Peas, beans, and lentils.  Milk and yogurt.  Fruit and juice.  Desserts, such as cakes, cookies, ice cream, and candy. How does alcohol affect me? Alcohol can cause a sudden decrease in blood glucose (hypoglycemia), especially if you use insulin or take certain oral diabetes medicines. Hypoglycemia can be a life-threatening condition. Symptoms of hypoglycemia (sleepiness, dizziness, and confusion) are similar to symptoms of having too much alcohol. If your health care provider says that alcohol is safe for you, follow these guidelines:  Limit alcohol intake to no more than 1 drink per day for nonpregnant women and 2 drinks per day for men. One drink equals 12 oz of beer, 5 oz of wine, or 1 oz of hard liquor.  Do not drink on an empty stomach.  Keep yourself hydrated with water, diet soda, or unsweetened iced tea.  Keep in mind that regular soda, juice, and other mixers may contain a lot of sugar and must be counted as carbs. What are tips for following this plan?  Reading food labels  Start by checking the serving size on the "Nutrition Facts" label of  packaged foods and drinks. The amount of calories, carbs, fats, and other nutrients listed on the label is based on one serving of the item. Many items contain more than one serving per package.  Check the total grams (g) of carbs in one serving. You can calculate the number of servings of carbs in one serving by dividing the total carbs by 15. For example, if a food has 30 g of total carbs, it would be equal to 2 servings of carbs.  Check the number of grams (g) of saturated and trans fats in one serving. Choose foods that have low or no amount of these fats.  Check the number of milligrams (mg) of salt (sodium) in one serving. Most people should limit total  sodium intake to less than 2,300 mg per day.  Always check the nutrition information of foods labeled as "low-fat" or "nonfat". These foods may be higher in added sugar or refined carbs and should be avoided.  Talk to your dietitian to identify your daily goals for nutrients listed on the label. Shopping  Avoid buying canned, premade, or processed foods. These foods tend to be high in fat, sodium, and added sugar.  Shop around the outside edge of the grocery store. This includes fresh fruits and vegetables, bulk grains, fresh meats, and fresh dairy. Cooking  Use low-heat cooking methods, such as baking, instead of high-heat cooking methods like deep frying.  Cook using healthy oils, such as olive, canola, or sunflower oil.  Avoid cooking with butter, cream, or high-fat meats. Meal planning  Eat meals and snacks regularly, preferably at the same times every day. Avoid going long periods of time without eating.  Eat foods high in fiber, such as fresh fruits, vegetables, beans, and whole grains. Talk to your dietitian about how many servings of carbs you can eat at each meal.  Eat 4-6 ounces (oz) of lean protein each day, such as lean meat, chicken, fish, eggs, or tofu. One oz of lean protein is equal to: ? 1 oz of meat, chicken, or fish. ? 1 egg. ?  cup of tofu.  Eat some foods each day that contain healthy fats, such as avocado, nuts, seeds, and fish. Lifestyle  Check your blood glucose regularly.  Exercise regularly as told by your health care provider. This may include: ? 150 minutes of moderate-intensity or vigorous-intensity exercise each week. This could be brisk walking, biking, or water aerobics. ? Stretching and doing strength exercises, such as yoga or weightlifting, at least 2 times a week.  Take medicines as told by your health care provider.  Do not use any products that contain nicotine or tobacco, such as cigarettes and e-cigarettes. If you need help quitting, ask  your health care provider.  Work with a Social worker or diabetes educator to identify strategies to manage stress and any emotional and social challenges. Questions to ask a health care provider  Do I need to meet with a diabetes educator?  Do I need to meet with a dietitian?  What number can I call if I have questions?  When are the best times to check my blood glucose? Where to find more information:  American Diabetes Association: diabetes.org  Academy of Nutrition and Dietetics: www.eatright.[employer] of Diabetes and Digestive and Kidney Diseases (NIH): [employer] Summary  A healthy meal plan will help you control your blood glucose and maintain a healthy lifestyle.  Working with a diet and nutrition specialist (dietitian) can help you make a meal  plan that is best for you.  Keep in mind that carbohydrates (carbs) and alcohol have immediate effects on your blood glucose levels. It is important to count carbs and to use alcohol carefully. This information is not intended to replace advice given to you by your health care provider. Make sure you discuss any questions you have with your health care provider. Document Revised: 12/10/2016 Document Reviewed: 02/02/2016 Elsevier Patient Education  2020 [employer].

## 2019-10-26 ENCOUNTER — Other Ambulatory Visit: Payer: Self-pay | Admitting: Family Medicine

## 2019-10-26 MED ORDER — ALLOPURINOL 300 MG PO TABS
300.0000 mg | ORAL_TABLET | Freq: Every day | ORAL | 1 refills | Status: DC
Start: 2019-10-26 — End: 2019-11-02

## 2019-10-26 NOTE — Telephone Encounter (Signed)
5th attempt to get prescription of Allopurinol 300MG  from [facility] [place]

## 2019-10-26 NOTE — Telephone Encounter (Signed)
Med sent.

## 2019-10-29 ENCOUNTER — Ambulatory Visit (INDEPENDENT_AMBULATORY_CARE_PROVIDER_SITE_OTHER): Payer: Medicare Other | Admitting: Family Medicine

## 2019-10-29 ENCOUNTER — Other Ambulatory Visit: Payer: Self-pay

## 2019-10-29 VITALS — BP 142/58 | HR 62 | Temp 97.6°F | Ht 68.5 in | Wt 156.6 lb

## 2019-10-29 DIAGNOSIS — D649 Anemia, unspecified: Secondary | ICD-10-CM

## 2019-10-29 DIAGNOSIS — I10 Essential (primary) hypertension: Secondary | ICD-10-CM | POA: Diagnosis not present

## 2019-10-29 DIAGNOSIS — E782 Mixed hyperlipidemia: Secondary | ICD-10-CM

## 2019-10-29 DIAGNOSIS — E119 Type 2 diabetes mellitus without complications: Secondary | ICD-10-CM

## 2019-10-29 DIAGNOSIS — Z8679 Personal history of other diseases of the circulatory system: Secondary | ICD-10-CM | POA: Diagnosis not present

## 2019-10-29 DIAGNOSIS — Z23 Encounter for immunization: Secondary | ICD-10-CM | POA: Diagnosis not present

## 2019-10-29 DIAGNOSIS — I5022 Chronic systolic (congestive) heart failure: Secondary | ICD-10-CM | POA: Diagnosis not present

## 2019-10-29 NOTE — Patient Instructions (Signed)
° ° ° °  If you have lab work done today you will be contacted with your lab results within the next 2 weeks.  If you have not heard from us then please contact us. The fastest way to get your results is to register for My Chart. ° ° °IF you received an x-ray today, you will receive an invoice from [facility]. Please contact [facility] at [phone] with questions or concerns regarding your invoice.  ° °IF you received labwork today, you will receive an invoice from [facility]. Please contact [facility] at [phone] with questions or concerns regarding your invoice.  ° °Our billing staff will not be able to assist you with questions regarding bills from these companies. ° °You will be contacted with the lab results as soon as they are available. The fastest way to get your results is to activate your My Chart account. Instructions are located on the last page of this paperwork. If you have not heard from us regarding the results in 2 weeks, please contact this office. °  ° ° ° °

## 2019-10-29 NOTE — Progress Notes (Signed)
Subjective:    Patient ID: Donald Rios, male    DOB: 08/04/1940, 79 y.o.   MRN: [id number]  Patient is a very pleasant 79 year old Caucasian male here today for a checkup.  At his last visit, there was considerable swelling noticed in his legs.  At that time we discontinued hydrochlorothiazide and replaced it with Lasix 40 mg a day.  The swelling has improved dramatically in his leg since doing this although he still does have trace bipedal edema.  The swelling does not bother him however.  He denies any orthopnea, dyspnea, or paroxysmal nocturnal dyspnea.  Recently had an echocardiogram that showed an ejection fraction of 65% but he did have grade 2 diastolic dysfunction.  This is most likely source for his leg swelling.  However he is not fluid overloaded on exam today, he is asymptomatic and has no evidence of pulmonary edema.  His most recent lab work is listed below.  Cholesterol is well controlled with an LDL cholesterol well below 70.  A1c is outstanding at 5.6.  Liver and kidney test are normal. Appointment on 10/02/2019  Component Date Value Ref Range Status  . Area-P 1/2 10/02/2019 3.48  cm2 Final  . S' Lateral 10/02/2019 3.30  cm Final  . MV M vel 10/02/2019 6.02  m/s Final  . MV Peak grad 10/02/2019 144.7  mmHg Final  Office Visit on 09/11/2019  Component Date Value Ref Range Status  . Hgb A1c MFr Bld 09/13/2019 5.6  <5.7 % of total Hgb Final   Comment: For the purpose of screening for the presence of diabetes: . <5.7%       Consistent with the absence of diabetes 5.7-6.4%    Consistent with increased risk for diabetes             (prediabetes) > or =6.5%  Consistent with diabetes . This assay result is consistent with a decreased risk of diabetes. . Currently, no consensus exists regarding use of hemoglobin A1c for diagnosis of diabetes in children. . According to American Diabetes Association (ADA) guidelines, hemoglobin A1c <7.0% represents optimal control in  non-pregnant diabetic patients. Different metrics may apply to specific patient populations.  Standards of Medical Care in Diabetes(ADA). .   . Mean Plasma Glucose 09/13/2019 114  (calc) Final  . eAG (mmol/L) 09/13/2019 6.3  (calc) Final  . WBC 09/13/2019 5.0  3.8 - 10.8 Thousand/uL Final  . RBC 09/13/2019 3.73* 4.20 - 5.80 Million/uL Final  . Hemoglobin 09/13/2019 11.7* 13.2 - 17.1 g/dL Final  . HCT 09/13/2019 35.4* 38 - 50 % Final  . MCV 09/13/2019 94.9  80.0 - 100.0 fL Final  . MCH 09/13/2019 31.4  27.0 - 33.0 pg Final  . MCHC 09/13/2019 33.1  32.0 - 36.0 g/dL Final  . RDW 09/13/2019 13.6  11.0 - 15.0 % Final  . Platelets 09/13/2019 154  140 - 400 Thousand/uL Final  . MPV 09/13/2019 12.4  7.5 - 12.5 fL Final  . Neutro Abs 09/13/2019 2,670  1,500 - 7,800 cells/uL Final  . Lymphs Abs 09/13/2019 1,650  850 - 3,900 cells/uL Final  . Absolute Monocytes 09/13/2019 390  200 - 950 cells/uL Final  . Eosinophils Absolute 09/13/2019 260  15.0 - 500.0 cells/uL Final  . Basophils Absolute 09/13/2019 30  0.0 - 200.0 cells/uL Final  . Neutrophils Relative % 09/13/2019 53.4  % Final  . Total Lymphocyte 09/13/2019 33.0  % Final  . Monocytes Relative 09/13/2019 7.8  % Final  . Eosinophils  Relative 09/13/2019 5.2  % Final  . Basophils Relative 09/13/2019 0.6  % Final  . Glucose, Bld 09/13/2019 116* 65 - 99 mg/dL Final   Comment: .            Fasting reference interval . For someone without known diabetes, a glucose value between 100 and 125 mg/dL is consistent with prediabetes and should be confirmed with a follow-up test. .   . BUN 09/13/2019 15  7 - 25 mg/dL Final  . Creat 09/13/2019 1.13  0.70 - 1.18 mg/dL Final   Comment: For patients >68 years of age, the reference limit for Creatinine is approximately 13% higher for people identified as African-American. .   . GFR, Est Non African American 09/13/2019 62  > OR = 60 mL/min/1.42m2 Final  . GFR, Est African American 09/13/2019 72  > OR  = 60 mL/min/1.26m2 Final  . BUN/Creatinine Ratio 98/11/9145 NOT APPLICABLE  6 - 22 (calc) Final  . Sodium 09/13/2019 139  135 - 146 mmol/L Final  . Potassium 09/13/2019 4.6  3.5 - 5.3 mmol/L Final  . Chloride 09/13/2019 105  98 - 110 mmol/L Final  . CO2 09/13/2019 28  20 - 32 mmol/L Final  . Calcium 09/13/2019 9.2  8.6 - 10.3 mg/dL Final  . Total Protein 09/13/2019 5.8* 6.1 - 8.1 g/dL Final  . Albumin 09/13/2019 4.0  3.6 - 5.1 g/dL Final  . Globulin 09/13/2019 1.8* 1.9 - 3.7 g/dL (calc) Final  . AG Ratio 09/13/2019 2.2  1.0 - 2.5 (calc) Final  . Total Bilirubin 09/13/2019 1.0  0.2 - 1.2 mg/dL Final  . Alkaline phosphatase (APISO) 09/13/2019 54  35 - 144 U/L Final  . AST 09/13/2019 15  10 - 35 U/L Final  . ALT 09/13/2019 10  9 - 46 U/L Final  . Cholesterol 09/13/2019 114  <200 mg/dL Final  . HDL 09/13/2019 58  > OR = 40 mg/dL Final  . Triglycerides 09/13/2019 41  <150 mg/dL Final  . LDL Cholesterol (Calc) 09/13/2019 44  mg/dL (calc) Final   Comment: Reference range: <100 . Desirable range <100 mg/dL for primary prevention;   <70 mg/dL for patients with CHD or diabetic patients  with > or = 2 CHD risk factors. Marland Kitchen LDL-C is now calculated using the Martin-Hopkins  calculation, which is a validated novel method providing  better accuracy than the Friedewald equation in the  estimation of LDL-C.  Cresenciano Genre et al. Annamaria Helling. 8295;621(30): 2061-2068  (http://education.[facility].com/faq/FAQ164)   . Total CHOL/HDL Ratio 09/13/2019 2.0  <5.0 (calc) Final  . Non-HDL Cholesterol (Calc) 09/13/2019 56  <130 mg/dL (calc) Final   Comment: For patients with diabetes plus 1 major ASCVD risk  factor, treating to a non-HDL-C goal of <100 mg/dL  (LDL-C of <70 mg/dL) is considered a therapeutic  option.     Past Medical History:  Diagnosis Date  . Acute gastric ulcer with hemorrhage 05/06/2013  . Arthritis   . Asthma   . Atrial enlargement, left   . CAD (coronary artery disease) of bypass  graft   . Cataract   . COPD (chronic obstructive pulmonary disease) ([place])   . Diabetes mellitus   . Diabetic nephropathy ([place])   . Diverticulosis   . ED (erectile dysfunction)   . GERD (gastroesophageal reflux disease)   . Gout   . Hearing loss   . Hiatal hernia 05/2008   EGD with HH and reflux esophagitis.   Marland Kitchen History of MI (myocardial infarction)   .  History of nuclear stress test 08/07/2010   dipyridamole; EKG negative for ischemia, low risk scan   . Hyperlipidemia   . Hyperplastic colon polyp 05/2008  . Hypertension   . Ischemic cardiomyopathy    EF 45%, with inferior wall motion abnormality   . Myocardial infarction ([place])   . Valvular regurgitation    mitral and tricuspid (mild)   Past Surgical History:  Procedure Laterality Date  . CORONARY ARTERY BYPASS GRAFT  2000   LIMA to LAD, free IMA to OM2, sequential graft to PLA & PLD  . ESOPHAGOGASTRODUODENOSCOPY N/A 05/07/2013   Procedure: ESOPHAGOGASTRODUODENOSCOPY (EGD);  Surgeon: Gatha Mayer, MD;  [facility] ENDOSCOPY;  Service: Endoscopy;  Laterality: N/A;  . TRANSTHORACIC ECHOCARDIOGRAM  09/07/2010   EF 19-50%; LV systolic function mildly reduced; LA mildly dilated; mild-mod MR & mild-mod TR; aortic root sclerosis/calcification;    Current Outpatient Medications on File Prior to Visit  Medication Sig Dispense Refill  . allopurinol (ZYLOPRIM) 300 MG tablet Take 1 tablet (300 mg total) by mouth daily. 90 tablet 1  . aspirin 81 MG tablet Take 81 mg by mouth daily.    . benazepril (LOTENSIN) 40 MG tablet Take 1 tablet (40 mg total) by mouth daily. 90 tablet 3  . cloNIDine (CATAPRES) 0.2 MG tablet Take 1 tablet (0.2 mg total) by mouth 2 (two) times daily. 30 tablet 0  . hydrochlorothiazide (HYDRODIURIL) 25 MG tablet Take 1 tablet (25 mg total) by mouth daily. 90 tablet 0  . metoprolol tartrate (LOPRESSOR) 50 MG tablet TAKE ONE AND ONE-HALF (1 & 1/2) TABLET BY MOUTH TWO TIMES A DAY 270 tablet 0  . mirtazapine (REMERON) 30 MG  tablet TAKE ONE TABLET BY MOUTH AT BEDTIME FOR APPETITE 30 tablet 4  . simvastatin (ZOCOR) 40 MG tablet Take 1 tablet (40 mg total) by mouth daily. 90 tablet 0   No current facility-administered medications on file prior to visit.   No Known Allergies Social History   Socioeconomic History  . Marital status: Married    Spouse name: Not on file  . Number of children: 1  . Years of education: Not on file  . Highest education level: Not on file  Occupational History  . Not on file  Tobacco Use  . Smoking status: Former Smoker    Quit date: 11/04/1985    Years since quitting: 34.0  . Smokeless tobacco: Never Used  Substance and Sexual Activity  . Alcohol use: Yes    Alcohol/week: 2.0 - 3.0 standard drinks    Types: 2 - 3 Cans of beer per week  . Drug use: No  . Sexual activity: Not on file  Other Topics Concern  . Not on file  Social History Narrative  . Not on file   Social Determinants of Health   Financial Resource Strain:   . Difficulty of Paying Living Expenses: Not on file  Food Insecurity:   . Worried About Charity fundraiser in the Last Year: Not on file  . Ran Out of Food in the Last Year: Not on file  Transportation Needs:   . Lack of Transportation (Medical): Not on file  . Lack of Transportation (Non-Medical): Not on file  Physical Activity:   . Days of Exercise per Week: Not on file  . Minutes of Exercise per Session: Not on file  Stress:   . Feeling of Stress : Not on file  Social Connections:   . Frequency of Communication with Friends and Family: Not  on file  . Frequency of Social Gatherings with Friends and Family: Not on file  . Attends Religious Services: Not on file  . Active Member of Clubs or Organizations: Not on file  . Attends Archivist Meetings: Not on file  . Marital Status: Not on file  Intimate Partner Violence:   . Fear of Current or Ex-Partner: Not on file  . Emotionally Abused: Not on file  . Physically Abused: Not on  file  . Sexually Abused: Not on file     Review of Systems  All other systems reviewed and are negative.      Objective:   Physical Exam Vitals reviewed.  Constitutional:      Appearance: He is well-developed.  Neck:     Thyroid: No thyromegaly.     Vascular: No JVD.  Cardiovascular:     Rate and Rhythm: Normal rate and regular rhythm.     Heart sounds: Normal heart sounds. No murmur heard.   Pulmonary:     Effort: Pulmonary effort is normal. No respiratory distress.     Breath sounds: Normal breath sounds. No wheezing or rales.  Chest:     Chest wall: No tenderness.  Abdominal:     General: Bowel sounds are normal. There is no distension.     Palpations: Abdomen is soft. There is no mass.     Tenderness: There is no abdominal tenderness. There is no guarding or rebound.  Musculoskeletal:     Cervical back: Neck supple.     Right lower leg: Edema present.     Left lower leg: Edema present.  Lymphadenopathy:     Cervical: No cervical adenopathy.  Skin:    Findings: No rash.           Assessment & Plan:  Anemia, unspecified type - Plan: Vitamin B12, Iron, Fecal Globin By Immunochemistry  Need for immunization against influenza - Plan: Flu Vaccine QUAD High Dose(Fluad)  Type 2 diabetes mellitus without complication, without long-term current use of insulin (HCC)  Mixed hyperlipidemia  History of ASCVD  Essential hypertension  Chronic systolic congestive heart failure, NYHA class 1 ([place])  Most recent echocardiogram reveals grade 2 diastolic dysfunction but his ejection fraction was well-preserved at 65%.  Continue Lasix 40 mg daily to help control leg swelling and prevent pulmonary edema.  Blood pressure today is borderline.  Of asked the patient to check his blood pressure daily for the next week and report the values to me.  If consistently greater than 140/90 uptitrate his medication to achieve better control.  Hemoglobin A1c is outstanding and LDL  cholesterol is excellent.  Patient received his flu shot today.  Encourage the patient to get a booster on his Covid vaccination.

## 2019-11-02 ENCOUNTER — Other Ambulatory Visit: Payer: Self-pay | Admitting: Family Medicine

## 2019-11-02 MED ORDER — ALLOPURINOL 300 MG PO TABS
300.0000 mg | ORAL_TABLET | Freq: Every day | ORAL | 1 refills | Status: DC
Start: 2019-11-02 — End: 2020-05-19

## 2019-11-07 ENCOUNTER — Other Ambulatory Visit: Payer: Self-pay

## 2019-11-07 MED ORDER — MIRTAZAPINE 30 MG PO TABS
ORAL_TABLET | ORAL | 4 refills | Status: DC
Start: 2019-11-07 — End: 2020-04-17

## 2019-11-09 ENCOUNTER — Ambulatory Visit: Payer: Self-pay | Admitting: Pharmacist

## 2019-11-09 NOTE — Chronic Care Management (AMB) (Addendum)
  Chronic Care Management   Follow Up Note   11/09/2019 Name: Donald Rios MRN: [id number] DOB: [birth year]  Referred by: Susy Frizzle, MD Reason for referral : No chief complaint on file.   Donald Rios is a 79 y.o. year old male who is a primary care patient of Pickard, Cammie Mcgee, MD. The CCM team was consulted for assistance with chronic disease management and care coordination needs.    Review of patient status, including review of consultants reports, relevant laboratory and other test results, and collaboration with appropriate care team members and the patient's provider was performed as part of comprehensive patient evaluation and provision of chronic care management services.    SDOH (Social Determinants of Health) assessments performed: No See Care Plan activities for detailed interventions related to [facility])      Outpatient Encounter Medications as of 11/09/2019  Medication Sig   allopurinol (ZYLOPRIM) 300 MG tablet Take 1 tablet (300 mg total) by mouth daily.   aspirin 81 MG tablet Take 81 mg by mouth daily.   benazepril (LOTENSIN) 40 MG tablet Take 1 tablet (40 mg total) by mouth daily.   cloNIDine (CATAPRES) 0.2 MG tablet Take 1 tablet (0.2 mg total) by mouth 2 (two) times daily.   hydrochlorothiazide (HYDRODIURIL) 25 MG tablet Take 1 tablet (25 mg total) by mouth daily.   metoprolol tartrate (LOPRESSOR) 50 MG tablet TAKE ONE AND ONE-HALF (1 & 1/2) TABLET BY MOUTH TWO TIMES A DAY   mirtazapine (REMERON) 30 MG tablet TAKE ONE TABLET BY MOUTH AT BEDTIME FOR APPETITE   simvastatin (ZOCOR) 40 MG tablet Take 1 tablet (40 mg total) by mouth daily.   No facility-administered encounter medications on file as of 11/09/2019.     Objective:  Analyze care gaps and adherence data for STAR measures.  Goals Addressed   None     Care gaps (2)  AWV not performed No depression screening  Adherence - not able to utilize data due to Rx benefit card not on file Reviewed fill  history and patient is adherent to BP medication and statin based on fill history - continue current refill strategy.  Plan:   The patient has been provided with contact information for the care management team and has been advised to call with any health related questions or concerns.   Beverly Milch, PharmD Clinical Pharmacist [place] [phone]  I have collaborated with the care management provider regarding care management and care coordination activities outlined in this encounter and have reviewed this encounter including documentation in the note and care plan. I am certifying that I agree with the content of this note and encounter as supervising physician.

## 2019-11-10 ENCOUNTER — Ambulatory Visit: Payer: Medicare Other | Attending: Internal Medicine

## 2019-11-10 ENCOUNTER — Other Ambulatory Visit: Payer: Self-pay

## 2019-11-10 DIAGNOSIS — Z23 Encounter for immunization: Secondary | ICD-10-CM

## 2019-11-10 NOTE — Progress Notes (Signed)
   Covid-19 Vaccination Clinic  Name:  Donald Rios    MRN: [id number] DOB: 05-25-1940  11/10/2019  [name] was observed post Covid-19 immunization for 15 minutes without incident. He was provided with Vaccine Information Sheet and instruction to access the V-Safe system.   [name] was instructed to call 911 with any severe reactions post vaccine: Marland Kitchen Difficulty breathing  . Swelling of face and throat  . A fast heartbeat  . A bad rash all over body  . Dizziness and weakness

## 2019-11-15 ENCOUNTER — Other Ambulatory Visit: Payer: Self-pay

## 2019-11-15 MED ORDER — CLONIDINE HCL 0.2 MG PO TABS: 0.2000 mg | ORAL_TABLET | Freq: Two times a day (BID) | ORAL | 3 refills | Status: DC

## 2019-11-15 MED ORDER — METOPROLOL TARTRATE 50 MG PO TABS: ORAL_TABLET | ORAL | 1 refills | Status: DC

## 2019-11-15 MED ORDER — SIMVASTATIN 40 MG PO TABS
40.0000 mg | ORAL_TABLET | Freq: Every day | ORAL | 0 refills | Status: DC
Start: 2019-11-15 — End: 2020-02-13

## 2020-01-01 ENCOUNTER — Other Ambulatory Visit: Payer: Self-pay

## 2020-01-01 MED ORDER — CLONIDINE HCL 0.2 MG PO TABS: 0.2000 mg | ORAL_TABLET | Freq: Two times a day (BID) | ORAL | 3 refills | Status: DC

## 2020-01-21 ENCOUNTER — Encounter: Payer: Self-pay | Admitting: Internal Medicine

## 2020-01-21 ENCOUNTER — Telehealth (INDEPENDENT_AMBULATORY_CARE_PROVIDER_SITE_OTHER): Payer: Medicare Other | Admitting: Internal Medicine

## 2020-01-21 VITALS — BP 118/56 | Ht 68.5 in | Wt 150.0 lb

## 2020-01-21 DIAGNOSIS — E782 Mixed hyperlipidemia: Secondary | ICD-10-CM | POA: Diagnosis not present

## 2020-01-21 DIAGNOSIS — Z951 Presence of aortocoronary bypass graft: Secondary | ICD-10-CM

## 2020-01-21 DIAGNOSIS — I255 Ischemic cardiomyopathy: Secondary | ICD-10-CM

## 2020-01-21 DIAGNOSIS — I10 Essential (primary) hypertension: Secondary | ICD-10-CM | POA: Diagnosis not present

## 2020-01-21 DIAGNOSIS — I25812 Atherosclerosis of bypass graft of coronary artery of transplanted heart without angina pectoris: Secondary | ICD-10-CM
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
## 2020-01-21 NOTE — Patient Instructions (Signed)
Medication Instructions:  No Changes In Medications at this time.  *If you need a refill on your cardiac medications before your next appointment, please call your pharmacy*  Follow-Up: At [facility], you and your health needs are our priority.  As part of our continuing mission to provide you with exceptional heart care, we have created designated Provider Care Teams.  These Care Teams include your primary Cardiologist (physician) and Advanced Practice Providers (APPs -  Physician Assistants and Nurse Practitioners) who all work together to provide you with the care you need, when you need it.    Your next appointment:   1 year   The format for your next appointment:   In Person  Provider:   K. Chad Hilty, MD 

## 2020-02-13 ENCOUNTER — Other Ambulatory Visit: Payer: Self-pay

## 2020-02-13 DIAGNOSIS — E782 Mixed hyperlipidemia: Secondary | ICD-10-CM

## 2020-02-13 MED ORDER — HYDROCHLOROTHIAZIDE 25 MG PO TABS: 25.0000 mg | ORAL_TABLET | Freq: Every day | ORAL | 0 refills | Status: DC

## 2020-02-13 MED ORDER — SIMVASTATIN 40 MG PO TABS: 40.0000 mg | ORAL_TABLET | Freq: Every day | ORAL | 0 refills | Status: DC

## 2020-03-06 DIAGNOSIS — E119 Type 2 diabetes mellitus without complications: Secondary | ICD-10-CM | POA: Diagnosis not present

## 2020-03-06 DIAGNOSIS — H524 Presbyopia: Secondary | ICD-10-CM | POA: Diagnosis not present

## 2020-03-06 DIAGNOSIS — H2513 Age-related nuclear cataract, bilateral: Secondary | ICD-10-CM | POA: Diagnosis not present

## 2020-03-06 LAB — HM DIABETES EYE EXAM

## 2020-03-10 ENCOUNTER — Ambulatory Visit (INDEPENDENT_AMBULATORY_CARE_PROVIDER_SITE_OTHER): Payer: Medicare Other | Admitting: Family Medicine

## 2020-03-10 ENCOUNTER — Other Ambulatory Visit: Payer: Self-pay

## 2020-03-10 VITALS — BP 138/76 | HR 65 | Temp 98.3°F | Resp 15 | Ht 68.5 in | Wt 154.0 lb

## 2020-03-10 DIAGNOSIS — I255 Ischemic cardiomyopathy: Secondary | ICD-10-CM

## 2020-03-10 DIAGNOSIS — E119 Type 2 diabetes mellitus without complications: Secondary | ICD-10-CM | POA: Diagnosis not present

## 2020-03-10 MED ORDER — AMLODIPINE BESYLATE 5 MG PO TABS: 5.0000 mg | ORAL_TABLET | Freq: Every day | ORAL | 3 refills | Status: DC

## 2020-03-10 NOTE — Progress Notes (Signed)
Subjective:    Patient ID: Donald Rios, male    DOB: January 29, 1940, 80 y.o.   MRN: [id number]  Patient is a very pleasant 80 year old Caucasian male here today for a checkup.  He has a history of coronary artery disease, diabetes mellitus, as well as PMR.  His blood pressure today is normal here however he states that every morning his blood pressure is typically in the 371 range systolic.  He states that every afternoon his blood pressure also starts to climb into the 062 range systolic.  It seems to fluctuate throughout the day.  He denies any chest pain shortness of breath or dyspnea on exertion.  He denies any polyuria, polydipsia, blurry vision.  Last lab work was checked in September and was excellent except for mild anemia.  He is due to repeat that lab work Past Medical History:  Diagnosis Date  . Acute gastric ulcer with hemorrhage 05/06/2013  . Arthritis   . Asthma   . Atrial enlargement, left   . 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 Cataract   . COPD (chronic obstructive pulmonary disease) ([place])   . Diabetes mellitus   . Diabetic nephropathy ([place])   . Diverticulosis   . ED (erectile dysfunction)   . GERD (gastroesophageal reflux disease)   . Gout   . Hearing loss   . Hiatal hernia 05/2008   EGD with HH and reflux esophagitis.   Marland Kitchen History of MI (myocardial infarction)   . History of nuclear stress test 08/07/2010   dipyridamole; EKG negative for ischemia, low risk scan   . Hyperlipidemia   . Hyperplastic colon polyp 05/2008  . Hypertension   . Ischemic cardiomyopathy    EF 45%, with inferior wall motion abnormality   . Myocardial infarction ([place])   . Valvular regurgitation    mitral and tricuspid (mild)   Past Surgical History:  Procedure Laterality Date  . CORONARY ARTERY BYPASS GRAFT  2000   LIMA to LAD, free IMA to OM2, sequential graft to PLA & PLD  . ESOPHAGOGASTRODUODENOSCOPY N/A 05/07/2013   Procedure: ESOPHAGOGASTRODUODENOSCOPY (EGD);  Surgeon: Gatha Mayer, MD;  Location[facility] ENDOSCOPY;  Service: Endoscopy;  Laterality: N/A;  . TRANSTHORACIC ECHOCARDIOGRAM  09/07/2010   EF 69-48%; LV systolic function mildly reduced; LA mildly dilated; mild-mod MR & mild-mod TR; aortic root sclerosis/calcification;    Current Outpatient Medications on File Prior to Visit  Medication Sig Dispense Refill  . allopurinol (ZYLOPRIM) 300 MG tablet Take 1 tablet (300 mg total) by mouth daily. 90 tablet 1  . aspirin 81 MG tablet Take 81 mg by mouth daily.    . benazepril (LOTENSIN) 40 MG tablet Take 1 tablet (40 mg total) by mouth daily. 90 tablet 3  . cloNIDine (CATAPRES) 0.2 MG tablet Take 1 tablet (0.2 mg total) by mouth 2 (two) times daily. 30 tablet 3  . hydrochlorothiazide (HYDRODIURIL) 25 MG tablet Take 1 tablet (25 mg total) by mouth daily. 90 tablet 0  . metoprolol tartrate (LOPRESSOR) 50 MG tablet TAKE ONE AND ONE-HALF (1 & 1/2) TABLET BY MOUTH TWO TIMES A DAY 270 tablet 1  . mirtazapine (REMERON) 30 MG tablet TAKE ONE TABLET BY MOUTH AT BEDTIME FOR APPETITE 30 tablet 4  . simvastatin (ZOCOR) 40 MG tablet Take 1 tablet (40 mg total) by mouth daily. 90 tablet 0   No current facility-administered medications on file prior to visit.   No Known Allergies Social History   Socioeconomic History  .  Marital status: Married    Spouse name: Not on file  . Number of children: 1  . Years of education: Not on file  . Highest education level: Not on file  Occupational History  . Not on file  Tobacco Use  . Smoking status: Former Smoker    Quit date: 11/04/1985    Years since quitting: 34.3  . Smokeless tobacco: Never Used  Substance and Sexual Activity  . Alcohol use: Yes    Alcohol/week: 2.0 - 3.0 standard drinks    Types: 2 - 3 Cans of beer per week  . Drug use: No  . Sexual activity: Not on file  Other Topics Concern  . Not on file  Social History Narrative  . Not on file   Social Determinants of Health   Financial Resource Strain: Not on  file  Food Insecurity: Not on file  Transportation Needs: Not on file  Physical Activity: Not on file  Stress: Not on file  Social Connections: Not on file  Intimate Partner Violence: Not on file     Review of Systems  All other systems reviewed and are negative.      Objective:   Physical Exam Vitals reviewed.  Constitutional:      Appearance: He is well-developed.  Neck:     Thyroid: No thyromegaly.     Vascular: No JVD.  Cardiovascular:     Rate and Rhythm: Normal rate and regular rhythm.     Heart sounds: Normal heart sounds. No murmur heard.   Pulmonary:     Effort: Pulmonary effort is normal. No respiratory distress.     Breath sounds: Normal breath sounds. No wheezing or rales.  Chest:     Chest wall: No tenderness.  Abdominal:     General: Bowel sounds are normal. There is no distension.     Palpations: Abdomen is soft. There is no mass.     Tenderness: There is no abdominal tenderness. There is no guarding or rebound.  Musculoskeletal:     Cervical back: Neck supple.     Right lower leg: Edema present.     Left lower leg: Edema present.  Lymphadenopathy:     Cervical: No cervical adenopathy.  Skin:    Findings: No rash.           Assessment & Plan:  Type 2 diabetes mellitus without complication, without long-term current use of insulin (HCC) - Plan: Hemoglobin A1c, Lipid panel, Comprehensive metabolic panel, CBC with Differential/Platelet, Microalbumin, urine Blood pressure here is excellent however I am going to add amlodipine 5 mg a day and then recheck blood pressure in 1 week.  If blood pressure is better at home on a start to wean the patient off clonidine and gradually try to transition to amlodipine.  Ultimately I like him to be off clonidine and on amlodipine 10 mg a day.  I think that would better control his blood pressure.  Check an A1c, lipid panel, CBC, CMP.  Goal A1c is less than 6.5, goal LDL cholesterol is less than 70.  Recheck CBC to  monitor anemia

## 2020-03-11 DIAGNOSIS — D229 Melanocytic nevi, unspecified: Secondary | ICD-10-CM | POA: Diagnosis not present

## 2020-03-11 DIAGNOSIS — D225 Melanocytic nevi of trunk: Secondary | ICD-10-CM | POA: Diagnosis not present

## 2020-03-11 DIAGNOSIS — L304 Erythema intertrigo: Secondary | ICD-10-CM | POA: Diagnosis not present

## 2020-03-11 DIAGNOSIS — D485 Neoplasm of uncertain behavior of skin: Secondary | ICD-10-CM | POA: Diagnosis not present

## 2020-03-11 DIAGNOSIS — L905 Scar conditions and fibrosis of skin: Secondary | ICD-10-CM | POA: Diagnosis not present

## 2020-03-11 DIAGNOSIS — L814 Other melanin hyperpigmentation: Secondary | ICD-10-CM | POA: Diagnosis not present

## 2020-03-11 DIAGNOSIS — L821 Other seborrheic keratosis: Secondary | ICD-10-CM | POA: Diagnosis not present

## 2020-03-11 DIAGNOSIS — Z85820 Personal history of malignant melanoma of skin: Secondary | ICD-10-CM | POA: Diagnosis not present

## 2020-03-11 DIAGNOSIS — D1801 Hemangioma of skin and subcutaneous tissue: Secondary | ICD-10-CM | POA: Diagnosis not present

## 2020-03-12 ENCOUNTER — Other Ambulatory Visit: Payer: Medicare Other

## 2020-03-12 ENCOUNTER — Other Ambulatory Visit: Payer: Self-pay

## 2020-03-12 DIAGNOSIS — E119 Type 2 diabetes mellitus without complications: Secondary | ICD-10-CM | POA: Diagnosis not present

## 2020-03-13 ENCOUNTER — Other Ambulatory Visit: Payer: Self-pay | Admitting: *Deleted

## 2020-03-13 ENCOUNTER — Encounter: Payer: Self-pay | Admitting: *Deleted

## 2020-03-13 LAB — COMPREHENSIVE METABOLIC PANEL
AG Ratio: 2.4 (calc) (ref 1.0–2.5)
ALT: 8 U/L — ABNORMAL LOW (ref 9–46)
AST: 14 U/L (ref 10–35)
Albumin: 3.9 g/dL (ref 3.6–5.1)
Alkaline phosphatase (APISO): 56 U/L (ref 35–144)
BUN: 24 mg/dL (ref 7–25)
CO2: 28 mmol/L (ref 20–32)
Calcium: 9.1 mg/dL (ref 8.6–10.3)
Chloride: 105 mmol/L (ref 98–110)
Creat: 1.07 mg/dL (ref 0.70–1.18)
Globulin: 1.6 g/dL (calc) — ABNORMAL LOW (ref 1.9–3.7)
Glucose, Bld: 120 mg/dL — ABNORMAL HIGH (ref 65–99)
Potassium: 4.6 mmol/L (ref 3.5–5.3)
Sodium: 140 mmol/L (ref 135–146)
Total Bilirubin: 0.7 mg/dL (ref 0.2–1.2)
Total Protein: 5.5 g/dL — ABNORMAL LOW (ref 6.1–8.1)

## 2020-03-13 LAB — CBC WITH DIFFERENTIAL/PLATELET
Absolute Monocytes: 681 cells/uL (ref 200–950)
Basophils Absolute: 30 cells/uL (ref 0–200)
Basophils Relative: 0.4 %
Eosinophils Absolute: 303 cells/uL (ref 15–500)
Eosinophils Relative: 4.1 %
HCT: 35.6 % — ABNORMAL LOW (ref 38.5–50.0)
Hemoglobin: 11.9 g/dL — ABNORMAL LOW (ref 13.2–17.1)
Lymphs Abs: 1983 cells/uL (ref 850–3900)
MCH: 32.3 pg (ref 27.0–33.0)
MCHC: 33.4 g/dL (ref 32.0–36.0)
MCV: 96.7 fL (ref 80.0–100.0)
MPV: 12.1 fL (ref 7.5–12.5)
Monocytes Relative: 9.2 %
Neutro Abs: 4403 cells/uL (ref 1500–7800)
Neutrophils Relative %: 59.5 %
Platelets: 178 10*3/uL (ref 140–400)
RBC: 3.68 10*6/uL — ABNORMAL LOW (ref 4.20–5.80)
RDW: 13.7 % (ref 11.0–15.0)
Total Lymphocyte: 26.8 %
WBC: 7.4 10*3/uL (ref 3.8–10.8)

## 2020-03-13 LAB — HEMOGLOBIN A1C
Hgb A1c MFr Bld: 5.6 % of total Hgb (ref <5.7)
Mean Plasma Glucose: 114 mg/dL
eAG (mmol/L): 6.3 mmol/L

## 2020-03-13 LAB — MICROALBUMIN, URINE: Microalb, Ur: 0.5 mg/dL

## 2020-03-13 LAB — LIPID PANEL
Cholesterol: 102 mg/dL (ref <200)
HDL: 53 mg/dL (ref >=40)
LDL Cholesterol (Calc): 37 mg/dL (calc)
Non-HDL Cholesterol (Calc): 49 mg/dL (calc) (ref <130)
Total CHOL/HDL Ratio: 1.9 (calc) (ref <5.0)
Triglycerides: 42 mg/dL (ref <150)

## 2020-03-13 MED ORDER — CLONIDINE HCL 0.2 MG PO TABS: 0.2000 mg | ORAL_TABLET | Freq: Two times a day (BID) | ORAL | 3 refills | Status: DC

## 2020-03-18 ENCOUNTER — Encounter: Payer: Self-pay | Admitting: Family Medicine

## 2020-03-28 ENCOUNTER — Telehealth: Payer: Self-pay | Admitting: Pharmacist

## 2020-03-28 NOTE — Progress Notes (Addendum)
° ° °  Chronic Care Management Pharmacy Assistant   Name: Donald Rios  MRN: [id number] DOB: September 22, 1940  Reason for Encounter:General Disease State Call   Conditions to be addressed/monitored: CAD, HTN, HF, Type II DM, HLD, Gout  Recent office visits:  03/10/20 Dr. Dennard Schaumann For Type 2 DM. Per note: If blood pressure is better at home on a start to wean the patient off clonidine and gradually try to transition to amlodipine.  Ultimately I like him to be off clonidine and on amlodipine 10 mg a day. STARTED Amlodipine Besylate 5 mg daily.  Recent consult visits:  01/21/20 Video Visit Pixie Casino, MD. For Coronary artery disease.  No medication changes.  Hospital visits:  None since 11/09/19  Medications: Outpatient Encounter Medications as of 03/28/2020  Medication Sig   allopurinol (ZYLOPRIM) 300 MG tablet Take 1 tablet (300 mg total) by mouth daily.   amLODipine (NORVASC) 5 MG tablet Take 1 tablet (5 mg total) by mouth daily.   aspirin 81 MG tablet Take 81 mg by mouth daily.   benazepril (LOTENSIN) 40 MG tablet Take 1 tablet (40 mg total) by mouth daily.   cloNIDine (CATAPRES) 0.2 MG tablet Take 1 tablet (0.2 mg total) by mouth 2 (two) times daily.   hydrochlorothiazide (HYDRODIURIL) 25 MG tablet Take 1 tablet (25 mg total) by mouth daily.   metoprolol tartrate (LOPRESSOR) 50 MG tablet TAKE ONE AND ONE-HALF (1 & 1/2) TABLET BY MOUTH TWO TIMES A DAY   mirtazapine (REMERON) 30 MG tablet TAKE ONE TABLET BY MOUTH AT BEDTIME FOR APPETITE   simvastatin (ZOCOR) 40 MG tablet Take 1 tablet (40 mg total) by mouth daily.   No facility-administered encounter medications on file as of 03/28/2020.   GEN CALL: Patient stated he works outside with wood daily but he does not have any regular excersie routine. He stated he doesn't have a good appetite, he stated he's usually not hungry or the foods don't taste good. He stated he has to force hisself to eat. Patient stated he eats healthy most of the  time. He stated he does eat some fried foods. He stated he drinks mostly water but he does drink coffee in the morning. He stated Dr. Dennard Schaumann wanted him to start checking his blood pressure daily. He stated his blood pressures have been around the 120-130's over 40's-50's.  Star Rating Drugs: Benazepril 40 mg 1 tablet daily 02/10/20 90 DS, Hydrochlorothiazide 25 mg 1 tablet daily 02/13/20 90 DS, Simvastatin 40 mg 1 tablet daily 02/13/20 90 DS.   Follow-Up:Pharmacaist Review  Charlann Lange, RMA Clinical Pharmacist Assistant [phone]  10 minutes spent in review, coordination, and documentation.  Reviewed by: Beverly Milch, PharmD Clinical Pharmacist [facility] [phone]

## 2020-04-17 ENCOUNTER — Other Ambulatory Visit: Payer: Self-pay | Admitting: *Deleted

## 2020-04-17 MED ORDER — MIRTAZAPINE 30 MG PO TABS: ORAL_TABLET | ORAL | 4 refills | Status: DC

## 2020-04-18 NOTE — Progress Notes (Deleted)
Chronic Care Management Pharmacy Note  04/18/2020 Name:  Donald Rios MRN:  [id number] DOB:  1940/04/20  Subjective: STEPFON RAWLES is an 80 y.o. year old male who is a primary patient of Pickard, Cammie Mcgee, MD.  The CCM team was consulted for assistance with disease management and care coordination needs.    Engaged with patient by telephone for follow up visit in response to provider referral for pharmacy case management and/or care coordination services.   Consent to Services:  The patient was given the following information about Chronic Care Management services today, agreed to services, and gave verbal consent: 1. CCM service includes personalized support from designated clinical staff supervised by the primary care provider, including individualized plan of care and coordination with other care providers 2. 24/7 contact phone numbers for assistance for urgent and routine care needs. 3. Service will only be billed when office clinical staff spend 20 minutes or more in a month to coordinate care. 4. Only one practitioner may furnish and bill the service in a calendar month. 5.The patient may stop CCM services at any time (effective at the end of the month) by phone call to the office staff. 6. The patient will be responsible for cost sharing (co-pay) of up to 20% of the service fee (after annual deductible is met). Patient agreed to services and consent obtained.  Patient Care Team: Susy Frizzle, MD as PCP - General (Family Medicine) Edythe Clarity, [facility] as Pharmacist (Pharmacist)  Recent office visits: 03/10/20 Dennard Rios) - started on amlodipine 7m daily, then increased up to 152mon 03/18/20  Recent consult visits: 01/21/20 ([place]) - no changes to medication noted, regular follow upp  Hospital visits: None in previous 6 months  Objective:  Lab Results  Component Value Date   CREATININE 1.07 03/12/2020   BUN 24 03/12/2020   GFRNONAA 62 09/13/2019   GFRAA 72 09/13/2019    NA 140 03/12/2020   K 4.6 03/12/2020   CALCIUM 9.1 03/12/2020   CO2 28 03/12/2020   GLUCOSE 120 (H) 03/12/2020    Lab Results  Component Value Date/Time   HGBA1C 5.6 03/12/2020 08:38 AM   HGBA1C 5.6 09/13/2019 09:07 AM   MICROALBUR 0.5 03/12/2020 08:38 AM   MICROALBUR 0.4 04/08/2017 08:23 AM    Last diabetic Eye exam:  Lab Results  Component Value Date/Time   HMDIABEYEEXA No Retinopathy 03/06/2020 12:00 AM    Last diabetic Foot exam: No results found for: HMDIABFOOTEX   Lab Results  Component Value Date   CHOL 102 03/12/2020   HDL 53 03/12/2020   LDLCALC 37 03/12/2020   TRIG 42 03/12/2020   CHOLHDL 1.9 03/12/2020    Hepatic Function Latest Ref Rng & Units 03/12/2020 09/13/2019 12/19/2018  Total Protein 6.1 - 8.1 g/dL 5.5(L) 5.8(L) 5.3(L)  Albumin 3.6 - 5.1 g/dL - - -  AST 10 - 35 U/L _0 ALT 9 - 46 U/L 8(L) 10 15  Alk Phosphatase 40 - 115 U/L - - -  Total Bilirubin 0.2 - 1.2 mg/dL 0.7 1.0 0.6  Bilirubin, Direct 0.0 - 0.2 mg/dL - - -    Lab Results  Component Value Date/Time   TSH 3.24 12/19/2018 09:33 AM    CBC Latest Ref Rng & Units 03/12/2020 09/13/2019 12/19/2018  WBC 3.8 - 10.8 Thousand/uL 7.4 5.0 5.8  Hemoglobin 13.2 - 17.1 g/dL 11.9(L) 11.7(L) 12.0(L)  Hematocrit 38.5 - 50.0 % 35.6(L) 35.4(L) 35.4(L)  Platelets 140 - 400 Thousand/uL 178  154 183    No results found for: VD25OH  Clinical ASCVD: {YES/NO:21197} The ASCVD Risk score Mikey Bussing DC Jr., et al., 2013) failed to calculate for the following reasons:   The valid total cholesterol range is 130 to 320 mg/dL    Depression screen [facility] 2/9 10/29/2019 10/17/2017 04/11/2017  Decreased Interest 0 0 0  Down, Depressed, Hopeless 0 0 0  PHQ - 2 Score 0 0 0  Altered sleeping - - -  Tired, decreased energy - - -  Change in appetite - - -  Feeling bad or failure about yourself  - - -  Trouble concentrating - - -  Moving slowly or fidgety/restless - - -  Suicidal thoughts - - -  PHQ-9 Score - - -  Difficult  doing work/chores - - -     ***Other: (CHADS2VASc if Afib, MMRC or CAT for COPD, ACT, DEXA)  Social History   Tobacco Use  Smoking Status Former Smoker  . Quit date: 11/04/1985  . Years since quitting: 34.4  Smokeless Tobacco Never Used   BP Readings from Last 3 Encounters:  03/10/20 138/76  01/21/20 (!) 118/56  10/29/19 (!) 142/58   Pulse Readings from Last 3 Encounters:  03/10/20 65  10/29/19 62  10/25/19 68   Wt Readings from Last 3 Encounters:  03/10/20 154 lb (69.9 kg)  01/21/20 150 lb (68 kg)  10/29/19 156 lb 9.6 oz (71 kg)   BMI Readings from Last 3 Encounters:  03/10/20 23.08 kg/m  01/21/20 22.48 kg/m  10/29/19 23.46 kg/m    Assessment/Interventions: Review of patient past medical history, allergies, medications, health status, including review of consultants reports, laboratory and other test data, was performed as part of comprehensive evaluation and provision of chronic care management services.   SDOH:  (Social Determinants of Health) assessments and interventions performed: {yes/no:20286}  SDOH Screenings   Alcohol Screen: Not on file  Depression (PHQ2-9): Low Risk   . PHQ-2 Score: 0  Financial Resource Strain: Not on file  Food Insecurity: Not on file  Housing: Not on file  Physical Activity: Not on file  Social Connections: Not on file  Stress: Not on file  Tobacco Use: Medium Risk  . Smoking Tobacco Use: Former Smoker  . Smokeless Tobacco Use: Never Used  Transportation Needs: Not on file    [place]  No Known Allergies  Medications Reviewed Today    Reviewed by Pixie Casino, MD (Physician) on 01/21/20 at 1320  Med List Status: <None>  Medication Order Taking? Sig Documenting Provider Last Dose Status Informant  allopurinol (ZYLOPRIM) 300 MG tablet [id number] Yes Take 1 tablet (300 mg total) by mouth daily. Susy Frizzle, MD Taking Active   aspirin 81 MG tablet [id number] Yes Take 81 mg by mouth daily. [provider] Taking Active Self  benazepril (LOTENSIN) 40 MG tablet [id number] Yes Take 1 tablet (40 mg total) by mouth daily. Susy Frizzle, MD Taking Active   cloNIDine (CATAPRES) 0.2 MG tablet [id number] Yes Take 1 tablet (0.2 mg total) by mouth 2 (two) times daily. Susy Frizzle, MD Taking Active   hydrochlorothiazide (HYDRODIURIL) 25 MG tablet [id number] Yes Take 1 tablet (25 mg total) by mouth daily. Susy Frizzle, MD Taking Active   metoprolol tartrate (LOPRESSOR) 50 MG tablet [id number] Yes TAKE ONE AND ONE-HALF (1 & 1/2) TABLET BY MOUTH TWO TIMES A DAY Susy Frizzle, MD Taking Active   mirtazapine (REMERON) 30 MG tablet [id number] Yes TAKE  ONE TABLET BY MOUTH AT BEDTIME FOR APPETITE Susy Frizzle, MD Taking Active   simvastatin (ZOCOR) 40 MG tablet [id number] Yes Take 1 tablet (40 mg total) by mouth daily. Susy Frizzle, MD Taking Active           Patient Active Problem List   Diagnosis Date Noted  . Protein-calorie malnutrition ([place]) 11/21/2018  . Hx of CABG 08/30/2017  . 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 Cardiomyopathy, ischemic 12/23/2015  . Chronic systolic congestive heart failure, NYHA class 1 ([place]) 12/23/2015  . Acute gastric ulcer with hemorrhage 05/06/2013  . 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 Mild tricuspid regurgitation   . Mitral regurgitation   . Atrial enlargement, left   . Hearing loss   . Diverticulosis   . Hiatal hernia   . Gout   . DM2 (diabetes mellitus, type 2) ([place])   . GERD (gastroesophageal reflux disease)   . Mixed hyperlipidemia   . Essential hypertension     Immunization History  Administered Date(s) Administered  . Fluad Quad(high Dose 65+) 10/17/2018, 10/29/2019  . Influenza Whole 11/22/2002, 11/07/2003, 10/06/2009  . Influenza, High Dose Seasonal PF 10/17/2017  . Influenza,inj,Quad PF,6+ Mos 11/02/2012, 10/01/2013, 10/04/2014, 10/03/2015,  10/11/2016  . PFIZER(Purple Top)SARS-COV-2 Vaccination 02/20/2019, 03/17/2019, 11/10/2019  . Pneumococcal Conjugate-13 01/26/2013  . Pneumococcal Polysaccharide-23 10/27/1998, 03/31/2015  . Td 03/27/2009  . Tdap 05/24/2011  . Zoster 01/03/2012    Conditions to be addressed/monitored:  CAD, HTN, HF, Type II DM, HLD, Gout.  There are no care plans that you recently modified to display for this patient.    Medication Assistance: {MEDASSISTANCEINFO:25044}  Patient's preferred pharmacy is:  [facility] [address], [place] [address] Phone: [phone] Fax: [phone]  [id number] [place] [address] [place]Phone: [phone]: [phone] Uses pill box? {Yes or If no, why not?:20788} Pt endorses ***% compliance  We discussed: {Pharmacy options:24294} Patient decided to: {US Pharmacy Plan:23885}  Care Plan and Follow Up Patient Decision:  {FOLLOWUP:24991}  Plan: {CM FOLLOW UP PLAN:25073}  *** Current Barriers:  . {pharmacybarriers:24917} . ***  Pharmacist Clinical Goal(s):  .Marland KitchenPatient will {PHARMACYGOALCHOICES:24921} through collaboration with PharmD and provider.  . ***  Interventions: . 1:1 collaboration with PSusy Frizzle MD regarding development and update of comprehensive plan of care as evidenced by provider attestation and co-signature . Inter-disciplinary care team collaboration (see longitudinal plan of care) . Comprehensive medication review performed; medication list updated in electronic medical record  Hypertension (BP goal {CHL HP UPSTREAM Pharmacist BP ranges:[phone]}) -{US controlled/uncontrolled:25276} -Current treatment: . Amlodipine 110mdaily . Benazepril 4083maily . Clonidine 0.2mg18mD . HCTZ 25mg6mly . Metoprolol tartrate 50mg 20mand one-half tablets  BID -Medications previously tried: ***  -Current home readings: *** -Current dietary habits: *** -Current exercise habits: *** -{ACTIONS;DENIES/REPORTS:21021675::"Denies"} hypotensive/hypertensive symptoms -Educated on {CCM BP Counseling:25124} -Counseled to monitor BP at home ***, document, and provide log at future appointments -{CCMPHARMDINTERVENTION:25122}  Hyperlipidemia/CAD: (LDL goal < ***) -{US controlled/uncontrolled:25276} -Current treatment: . Simvastatin 40mg d41m -Medications previously tried: ***  -Current dietary patterns: *** -Current exercise habits: *** -Educated on {CCM HLD Counseling:25126} -{CCMPHARMDINTERVENTION:25122}  Diabetes (A1c goal {A1c goals:23924}) -{US controlled/uncontrolled:25276} -Current medications: . *** -Medications previously tried: ***  -Current home glucose readings .  fasting glucose: *** . post prandial glucose: *** -{ACTIONS;DENIES/REPORTS:21021675::"Denies"} hypoglycemic/hyperglycemic symptoms -Current meal patterns:  . breakfast: ***  . lunch: ***  . dinner: *** . snacks: *** . drinks: *** -Current exercise: *** -Educated on {CCM DM COUNSELING:25123} -Counseled to check feet daily and get yearly eye exams -{CCMPHARMDINTERVENTION:25122}  Heart Failure (Goal: manage symptoms and prevent exacerbations) -{US controlled/uncontrolled:25276} -Last ejection fraction: *** (Date: ***) -HF type: {type of heart failure:30421350} -NYHA Class: {CHL HP Upstream Pharm NYHA [phone]} -AHA HF Stage: {CHL HP Upstream Pharm AHA HF [phone]} -Current treatment: . Metoprolol tartrate 59m one and one-half tablets po BID -Medications previously tried: ***  -Current home BP/HR readings: *** -Current dietary habits: *** -Current exercise habits: *** -Educated on {CCM HF Counseling:25125} -{CCMPHARMDINTERVENTION:25122}   Patient Goals/Self-Care Activities . Patient will:  - {pharmacypatientgoals:24919}  Follow Up Plan:  {CM FOLLOW UP PDTOI:71245}

## 2020-04-24 ENCOUNTER — Telehealth: Payer: Self-pay

## 2020-05-10 ENCOUNTER — Other Ambulatory Visit: Payer: Self-pay | Admitting: Family Medicine

## 2020-05-16 ENCOUNTER — Other Ambulatory Visit: Payer: Self-pay | Admitting: *Deleted

## 2020-05-16 MED ORDER — METOPROLOL TARTRATE 50 MG PO TABS: ORAL_TABLET | ORAL | 1 refills | Status: DC

## 2020-05-19 ENCOUNTER — Telehealth: Payer: Self-pay | Admitting: Family Medicine

## 2020-05-19 DIAGNOSIS — E782 Mixed hyperlipidemia: Secondary | ICD-10-CM

## 2020-05-19 DIAGNOSIS — E119 Type 2 diabetes mellitus without complications: Secondary | ICD-10-CM

## 2020-05-19 MED ORDER — HYDROCHLOROTHIAZIDE 25 MG PO TABS: 1.0000 | ORAL_TABLET | Freq: Every day | ORAL | 3 refills | Status: DC

## 2020-05-19 MED ORDER — SIMVASTATIN 40 MG PO TABS: 40.0000 mg | ORAL_TABLET | Freq: Every day | ORAL | 3 refills | Status: DC

## 2020-05-19 MED ORDER — METOPROLOL TARTRATE 50 MG PO TABS: ORAL_TABLET | ORAL | 1 refills | Status: DC

## 2020-05-19 MED ORDER — AMLODIPINE BESYLATE 5 MG PO TABS: 5.0000 mg | ORAL_TABLET | Freq: Every day | ORAL | 3 refills | Status: DC

## 2020-05-19 MED ORDER — BENAZEPRIL HCL 40 MG PO TABS: 40.0000 mg | ORAL_TABLET | Freq: Every day | ORAL | 3 refills | Status: DC

## 2020-05-19 MED ORDER — MIRTAZAPINE 30 MG PO TABS: ORAL_TABLET | ORAL | 4 refills | Status: DC

## 2020-05-19 MED ORDER — ALLOPURINOL 300 MG PO TABS: 300.0000 mg | ORAL_TABLET | Freq: Every day | ORAL | 1 refills | Status: DC

## 2020-05-19 MED ORDER — CLONIDINE HCL 0.2 MG PO TABS: 0.2000 mg | ORAL_TABLET | Freq: Two times a day (BID) | ORAL | 3 refills | Status: DC

## 2020-05-19 NOTE — Telephone Encounter (Signed)
Pt called in stating he is out of most of his meds if not all, and has scheduled an appt to discuss his meds. Pt asked if he could get meds sent to his pharmacy to last him until his appt on 5/12.  Cb#: [phone]

## 2020-05-19 NOTE — Telephone Encounter (Signed)
Spoke with pt, verified meds, sent request to pharmacy

## 2020-05-22 ENCOUNTER — Other Ambulatory Visit: Payer: Self-pay

## 2020-05-22 ENCOUNTER — Ambulatory Visit (INDEPENDENT_AMBULATORY_CARE_PROVIDER_SITE_OTHER): Payer: Medicare Other | Admitting: Family Medicine

## 2020-05-22 ENCOUNTER — Encounter: Payer: Self-pay | Admitting: Family Medicine

## 2020-05-22 DIAGNOSIS — I255 Ischemic cardiomyopathy: Secondary | ICD-10-CM

## 2020-05-22 DIAGNOSIS — I10 Essential (primary) hypertension: Secondary | ICD-10-CM

## 2020-05-22 MED ORDER — AMLODIPINE BESYLATE 5 MG PO TABS: 10.0000 mg | ORAL_TABLET | Freq: Every day | ORAL | 3 refills | Status: DC

## 2020-05-22 NOTE — Progress Notes (Signed)
Subjective:    Patient ID: Donald Rios, male    DOB: Feb 05, 1940, 80 y.o.   MRN: [id number]  There was some miscommunication.  Patient felt that he had to be seen before he get his medicine refilled.  He is upset by this.  This was an oversight on our part.  At his last visit, we try to wean him off clonidine and replace it with amlodipine.  He was able to get to 5 mg of amlodipine and his blood pressure was doing well however he then ran out of amlodipine and reverted back to clonidine.  He also reports swelling in both ankles.  He has +1 pitting edema in both ankles and I explained to the patient that this will likely worsen with amlodipine. Past Medical History:  Diagnosis Date  . Acute gastric ulcer with hemorrhage 05/06/2013  . Arthritis   . Asthma   . Atrial enlargement, left   . 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 Cataract   . COPD (chronic obstructive pulmonary disease) ([place])   . Diabetes mellitus   . Diabetic nephropathy ([place])   . Diverticulosis   . ED (erectile dysfunction)   . GERD (gastroesophageal reflux disease)   . Gout   . Hearing loss   . Hiatal hernia 05/2008   EGD with HH and reflux esophagitis.   Marland Kitchen History of MI (myocardial infarction)   . History of nuclear stress test 08/07/2010   dipyridamole; EKG negative for ischemia, low risk scan   . Hyperlipidemia   . Hyperplastic colon polyp 05/2008  . Hypertension   . Ischemic cardiomyopathy    EF 45%, with inferior wall motion abnormality   . Myocardial infarction ([place])   . Valvular regurgitation    mitral and tricuspid (mild)   Past Surgical History:  Procedure Laterality Date  . CORONARY ARTERY BYPASS GRAFT  2000   LIMA to LAD, free IMA to OM2, sequential graft to PLA & PLD  . ESOPHAGOGASTRODUODENOSCOPY N/A 05/07/2013   Procedure: ESOPHAGOGASTRODUODENOSCOPY (EGD);  Surgeon: Gatha Mayer, MD;  [facility] ENDOSCOPY;  Service: Endoscopy;  Laterality: N/A;  . TRANSTHORACIC ECHOCARDIOGRAM   09/07/2010   EF 87-56%; LV systolic function mildly reduced; LA mildly dilated; mild-mod MR & mild-mod TR; aortic root sclerosis/calcification;    Current Outpatient Medications on File Prior to Visit  Medication Sig Dispense Refill  . allopurinol (ZYLOPRIM) 300 MG tablet Take 1 tablet (300 mg total) by mouth daily. 90 tablet 1  . amLODipine (NORVASC) 5 MG tablet Take 1 tablet (5 mg total) by mouth daily. 90 tablet 3  . aspirin 81 MG tablet Take 81 mg by mouth daily.    . benazepril (LOTENSIN) 40 MG tablet Take 1 tablet (40 mg total) by mouth daily. 90 tablet 3  . cloNIDine (CATAPRES) 0.2 MG tablet Take 1 tablet (0.2 mg total) by mouth 2 (two) times daily. 30 tablet 3  . hydrochlorothiazide (HYDRODIURIL) 25 MG tablet Take 1 tablet (25 mg total) by mouth daily. 90 tablet 3  . metoprolol tartrate (LOPRESSOR) 50 MG tablet TAKE ONE AND ONE-HALF (1 & 1/2) TABLET BY MOUTH TWO TIMES A DAY 270 tablet 1  . mirtazapine (REMERON) 30 MG tablet TAKE ONE TABLET BY MOUTH AT BEDTIME FOR APPETITE 30 tablet 4  . simvastatin (ZOCOR) 40 MG tablet Take 1 tablet (40 mg total) by mouth daily. 90 tablet 3   No current facility-administered medications on file prior to visit.   No Known Allergies  Social History   Socioeconomic History  . Marital status: Married    Spouse name: Not on file  . Number of children: 1  . Years of education: Not on file  . Highest education level: Not on file  Occupational History  . Not on file  Tobacco Use  . Smoking status: Former Smoker    Quit date: 11/04/1985    Years since quitting: 34.5  . Smokeless tobacco: Never Used  Substance and Sexual Activity  . Alcohol use: Yes    Alcohol/week: 2.0 - 3.0 standard drinks    Types: 2 - 3 Cans of beer per week  . Drug use: No  . Sexual activity: Not on file  Other Topics Concern  . Not on file  Social History Narrative  . Not on file   Social Determinants of Health   Financial Resource Strain: Not on file  Food  Insecurity: Not on file  Transportation Needs: Not on file  Physical Activity: Not on file  Stress: Not on file  Social Connections: Not on file  Intimate Partner Violence: Not on file     Review of Systems  All other systems reviewed and are negative.      Objective:   Physical Exam Vitals reviewed.  Constitutional:      Appearance: He is well-developed.  Neck:     Thyroid: No thyromegaly.     Vascular: No JVD.  Cardiovascular:     Rate and Rhythm: Normal rate and regular rhythm.     Heart sounds: Normal heart sounds. No murmur heard.   Pulmonary:     Effort: Pulmonary effort is normal. No respiratory distress.     Breath sounds: Normal breath sounds. No wheezing or rales.  Chest:     Chest wall: No tenderness.  Abdominal:     General: Bowel sounds are normal. There is no distension.     Palpations: Abdomen is soft. There is no mass.     Tenderness: There is no abdominal tenderness. There is no guarding or rebound.  Musculoskeletal:     Cervical back: Neck supple.     Right lower leg: Edema present.     Left lower leg: Edema present.  Lymphadenopathy:     Cervical: No cervical adenopathy.  Skin:    Findings: No rash.           Assessment & Plan:  Benign essential HTN - Plan: amLODipine (NORVASC) 5 MG tablet  Start amlodipine 5 mg a day.  Decrease clonidine to 0.1 mg twice a day.  Do this for 1 week.  At the conclusion of that week increase amlodipine to 10 mg a day and completely stop clonidine.  Monitor blood pressure for 1 week.  If patient's blood pressure stable and he feels good, at that time I will switch his hydrochlorothiazide to Lasix to better manage his swelling.  He will notify me in 2 weeks or sooner depending on how he feels.

## 2020-06-16 ENCOUNTER — Encounter: Payer: Self-pay | Admitting: Family Medicine

## 2020-06-17 DIAGNOSIS — Z85820 Personal history of malignant melanoma of skin: Secondary | ICD-10-CM | POA: Diagnosis not present

## 2020-06-17 DIAGNOSIS — D1801 Hemangioma of skin and subcutaneous tissue: Secondary | ICD-10-CM | POA: Diagnosis not present

## 2020-06-17 DIAGNOSIS — L304 Erythema intertrigo: Secondary | ICD-10-CM | POA: Diagnosis not present

## 2020-06-17 DIAGNOSIS — L821 Other seborrheic keratosis: Secondary | ICD-10-CM | POA: Diagnosis not present

## 2020-06-17 DIAGNOSIS — D485 Neoplasm of uncertain behavior of skin: Secondary | ICD-10-CM | POA: Diagnosis not present

## 2020-06-17 DIAGNOSIS — D225 Melanocytic nevi of trunk: Secondary | ICD-10-CM | POA: Diagnosis not present

## 2020-06-17 DIAGNOSIS — L905 Scar conditions and fibrosis of skin: Secondary | ICD-10-CM | POA: Diagnosis not present

## 2020-06-17 DIAGNOSIS — L249 Irritant contact dermatitis, unspecified cause: Secondary | ICD-10-CM | POA: Diagnosis not present

## 2020-06-18 ENCOUNTER — Telehealth: Payer: Self-pay

## 2020-06-18 NOTE — Progress Notes (Addendum)
Chronic Care Management Pharmacy Assistant   Name: DAKARAI MCGLOCKLIN  MRN: [id number] DOB: 1940/12/11  Reason for Encounter: Disease State For HTN.   Conditions to be addressed/monitored: CAD, HTN, HF, Type II DM, HLD, Gout  Recent office visits:  05/22/20 Dr. Dennard Schaumann For Hypertension. CHANGED Amlodipine to 10 mg daily. Per note: Start amlodipine 5 mg a day.  Decrease clonidine to 0.1 mg twice a day.  Do this for 1 week.  At the conclusion of that week increase amlodipine to 10 mg a day and completely stop clonidine.  Monitor blood pressure for 1 week.   Recent consult visits:  None since 03/28/20  Hospital visits:  None since 03/28/20  Medications: Outpatient Encounter Medications as of 06/18/2020  Medication Sig   allopurinol (ZYLOPRIM) 300 MG tablet Take 1 tablet (300 mg total) by mouth daily.   amLODipine (NORVASC) 5 MG tablet Take 2 tablets (10 mg total) by mouth daily.   aspirin 81 MG tablet Take 81 mg by mouth daily.   benazepril (LOTENSIN) 40 MG tablet Take 1 tablet (40 mg total) by mouth daily.   cloNIDine (CATAPRES) 0.2 MG tablet Take 1 tablet (0.2 mg total) by mouth 2 (two) times daily.   hydrochlorothiazide (HYDRODIURIL) 25 MG tablet Take 1 tablet (25 mg total) by mouth daily.   metoprolol tartrate (LOPRESSOR) 50 MG tablet TAKE ONE AND ONE-HALF (1 & 1/2) TABLET BY MOUTH TWO TIMES A DAY   mirtazapine (REMERON) 30 MG tablet TAKE ONE TABLET BY MOUTH AT BEDTIME FOR APPETITE   simvastatin (ZOCOR) 40 MG tablet Take 1 tablet (40 mg total) by mouth daily.   No facility-administered encounter medications on file as of 06/18/2020.    Reviewed chart prior to disease state call. Spoke with patient regarding BP  Recent Office Vitals: BP Readings from Last 3 Encounters:  05/22/20 136/72  03/10/20 138/76  01/21/20 (!) 118/56   Pulse Readings from Last 3 Encounters:  05/22/20 (!) 54  03/10/20 65  10/29/19 62    Wt Readings from Last 3 Encounters:  05/22/20 154 lb (69.9 kg)   03/10/20 154 lb (69.9 kg)  01/21/20 150 lb (68 kg)     Kidney Function Lab Results  Component Value Date/Time   CREATININE 1.07 03/12/2020 08:38 AM   CREATININE 1.13 09/13/2019 09:07 AM   GFRNONAA 62 09/13/2019 09:07 AM   GFRAA 72 09/13/2019 09:07 AM    BMP Latest Ref Rng & Units 03/12/2020 09/13/2019 12/19/2018  Glucose 65 - 99 mg/dL 120(H) 116(H) 140(H)  BUN 7 - 25 mg/dL 24 15 20   Creatinine 0.70 - 1.18 mg/dL 1.07 1.13 1.20(H)  BUN/Creat Ratio 6 - 22 (calc) NOT APPLICABLE NOT APPLICABLE 17  Sodium 656 - 146 mmol/L 140 139 141  Potassium 3.5 - 5.3 mmol/L 4.6 4.6 4.3  Chloride 98 - 110 mmol/L 105 105 108  CO2 20 - 32 mmol/L 28 28 25   Calcium 8.6 - 10.3 mg/dL 9.1 9.2 9.3    Current antihypertensive regimen:  Amlodipine 5 mg Take 2 tablets (10 mg total) by mouth daily Benazepril 40 mg 1 tablet daily Hydrochlorothiazide 25 mg 1 tablet daily Metoprolol 50 mg ONE AND ONE-HALF (1 & 1/2) TABLET BY MOUTH TWO TIMES A DAY  How often are you checking your Blood Pressure? Patient stated 1-2x per week   Current home BP readings: 06/18/20 101/48  What recent interventions/DTPs have been made by any provider to improve Blood Pressure control since last CPP Visit: Amlodipine 5 mg Take  2 tablets (10 mg total) by mouth daily  Any recent hospitalizations or ED visits since last visit with CPP? Patient stated no.   What diet changes have been made to improve Blood Pressure Control?  Patient stated he eats out a lot. He stated drinks water all day.   What exercise is being done to improve your Blood Pressure Control?  Patient stated he does things out in the yard daily.   Adherence Review: Is the patient currently on ACE/ARB medication? Benazepril 40 mg  Does the patient have >5 day gap between last estimated fill dates? Per misc rpts, no.   Star Rating Drugs: Benazepril 40 mg 90 DS 05/10/20 Simvastatin 40 mg 90 DS 05/19/20  Follow-Up:Pharmacist Review   Charlann Lange, RMA Clinical  Pharmacist Assistant [phone]  10 minutes spent in review, coordination, and documentation.  Reviewed by: Beverly Milch, PharmD Clinical Pharmacist [facility] [phone]

## 2020-06-25 NOTE — Progress Notes (Signed)
Subjective:   Donald Rios is a 80 y.o. male who presents for an Initial Medicare Annual Wellness Visit.  I connected with Donald Rios today by telephone and verified that I am speaking with the correct person using two identifiers. Location patient: home Location provider: work Persons participating in the virtual visit: patient, provider.   I discussed the limitations, risks, security and privacy concerns of performing an evaluation and management service by telephone and the availability of in person appointments. I also discussed with the patient that there may be a patient responsible charge related to this service. The patient expressed understanding and verbally consented to this telephonic visit.    Interactive audio and video telecommunications were attempted between this provider and patient, however failed, due to patient having technical difficulties OR patient did not have access to video capability.  We continued and completed visit with audio only.     Review of Systems    N/A  Cardiac Risk Factors include: advanced age (>2men, >25 women);male gender;hypertension;diabetes mellitus;dyslipidemia     Objective:    Today's Vitals   06/26/20 1520  PainSc: 0-No pain   There is no height or weight on file to calculate BMI.  Advanced Directives 06/26/2020 07/11/2013 05/07/2013  Does Patient Have a Medical Advance Directive? Yes Patient has advance directive, copy not in chart Patient has advance directive, copy not in chart  Type of Advance Directive [name];Living will Living will [name];Living will  Does patient want to make changes to medical advance directive? No - Patient declined - -  Copy of [name] in Chart? Yes - validated most recent copy scanned in chart (See row information) - Copy requested from family  Pre-existing out of facility DNR order (yellow form or pink MOST form) - - No    Current  Medications (verified) Outpatient Encounter Medications as of 06/26/2020  Medication Sig   allopurinol (ZYLOPRIM) 300 MG tablet Take 1 tablet (300 mg total) by mouth daily.   amLODipine (NORVASC) 5 MG tablet Take 2 tablets (10 mg total) by mouth daily.   aspirin 81 MG tablet Take 81 mg by mouth daily.   benazepril (LOTENSIN) 40 MG tablet Take 1 tablet (40 mg total) by mouth daily.   hydrochlorothiazide (HYDRODIURIL) 25 MG tablet Take 1 tablet (25 mg total) by mouth daily.   metoprolol tartrate (LOPRESSOR) 50 MG tablet TAKE ONE AND ONE-HALF (1 & 1/2) TABLET BY MOUTH TWO TIMES A DAY   mirtazapine (REMERON) 30 MG tablet TAKE ONE TABLET BY MOUTH AT BEDTIME FOR APPETITE   simvastatin (ZOCOR) 40 MG tablet Take 1 tablet (40 mg total) by mouth daily.   cloNIDine (CATAPRES) 0.2 MG tablet Take 1 tablet (0.2 mg total) by mouth 2 (two) times daily. (Patient not taking: Reported on 06/26/2020)   No facility-administered encounter medications on file as of 06/26/2020.    Allergies (verified) Patient has no known allergies.   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    History of MI (myocardial infarction)    History of nuclear stress test 08/07/2010   dipyridamole; EKG negative for ischemia, low risk scan    Hyperlipidemia    Hyperplastic colon polyp 05/2008   Hypertension    Ischemic cardiomyopathy    EF 45%, with inferior wall motion abnormality    Myocardial infarction ([place])    Valvular regurgitation    mitral and tricuspid (mild)   Past Surgical  History:  Procedure Laterality Date   CORONARY ARTERY BYPASS GRAFT  2000   LIMA to LAD, free IMA to OM2, sequential graft to PLA & PLD   ESOPHAGOGASTRODUODENOSCOPY N/A 05/07/2013   Procedure: ESOPHAGOGASTRODUODENOSCOPY (EGD);  Surgeon: Gatha Mayer, MD;  Location: [facility] ENDOSCOPY;  Service: Endoscopy;  Laterality: N/A;   TRANSTHORACIC ECHOCARDIOGRAM  09/07/2010   EF 40-98%; LV systolic function mildly reduced; LA mildly dilated; mild-mod MR & mild-mod TR; aortic root sclerosis/calcification;    Family History  Problem Relation Age of Onset   Stroke Father    Hypertension Father    Stroke Maternal Grandmother    Stroke Maternal Grandfather    Diabetes Brother    Esophageal cancer Brother    Stomach cancer Maternal Aunt    Colon cancer Neg Hx    Rectal cancer Neg Hx    Social History   Socioeconomic History   Marital status: Married    Spouse name: Not on file   Number of children: 1   Years of education: Not on file   Highest education level: Not on file  Occupational History   Not on file  Tobacco Use   Smoking status: Former    Pack years: 0.00    Types: Cigarettes    Quit date: 11/04/1985    Years since quitting: 34.6   Smokeless tobacco: Never  Substance and Sexual Activity   Alcohol use: Yes    Alcohol/week: 2.0 - 3.0 standard drinks    Types: 2 - 3 Cans of beer per week   Drug use: No   Sexual activity: Not on file  Other Topics Concern   Not on file  Social History Narrative   Not on file   Social Determinants of Health   Financial Resource Strain: Low Risk    Difficulty of Paying Living Expenses: Not hard at all  Food Insecurity: No Food Insecurity   Worried About Charity fundraiser in the Last Year: Never true   [place] in the Last Year: Never true  Transportation Needs: No Transportation Needs   Lack of Transportation (Medical): No   Lack of Transportation (Non-Medical): No  Physical Activity: Sufficiently Active   Days of Exercise per Week:  7 days   Minutes of Exercise per Session: 30 min  Stress: No Stress Concern Present   Feeling of Stress : Not at all  Social Connections: Moderately Integrated   Frequency of Communication with Friends and Family: More than three times a week   Frequency of Social Gatherings with Friends and Family: Three times a week   Attends Religious Services: 1 to 4 times per year   Active Member of Clubs or Organizations: No   Attends Archivist Meetings: Never   Marital Status: Married    Tobacco Counseling Counseling given: Not Answered   Clinical Intake:  Pre-visit preparation completed: Yes  Pain : No/denies pain Pain Score: 0-No pain     Nutritional Risks:  None Diabetes: Yes CBG done?: No Did pt. bring in CBG monitor from home?: No  How often do you need to have someone help you when you read instructions, pamphlets, or other written materials from your doctor or pharmacy?: 1 - Never  Diabetic?yes Nutrition Risk Assessment:  Has the patient had any N/V/D within the last 2 months?  No  Does the patient have any non-healing wounds?  No  Has the patient had any unintentional weight loss or weight gain?  No   Diabetes:  Is the patient diabetic?  Yes  If diabetic, was a CBG obtained today?  No  Did the patient bring in their glucometer from home?  No  How often do you monitor your CBG's? Patient states does not check glucose at home.   Financial Strains and Diabetes Management:  Are you having any financial strains with the device, your supplies or your medication? No .  Does the patient want to be seen by Chronic Care Management for management of their diabetes?  No  Would the patient like to be referred to a Nutritionist or for Diabetic Management?  No   Diabetic Exams:  Diabetic Eye Exam: Completed 03/06/2020 Diabetic Foot Exam: Completed 10/29/2019   Interpreter Needed?: No  Information entered by :: [place] of Daily Living In your  present state of health, do you have any difficulty performing the following activities: 06/26/2020  Hearing? Y  Vision? N  Difficulty concentrating or making decisions? N  Walking or climbing stairs? N  Dressing or bathing? N  Doing errands, shopping? N  Preparing Food and eating ? N  Using the Toilet? N  In the past six months, have you accidently leaked urine? N  Do you have problems with loss of bowel control? N  Managing your Medications? N  Managing your Finances? N  Housekeeping or managing your Housekeeping? N  Some recent data might be hidden    Patient Care Team: Susy Frizzle, MD as PCP - General (Family Medicine) Edythe Clarity, [facility] as Pharmacist (Pharmacist)  Indicate any recent Medical Services you may have received from other than [facility] providers in the past year (date may be approximate).     Assessment:   This is a routine wellness examination for [place].  Hearing/Vision screen Vision Screening - Comments:: Patient states gets eyes examined once per year. Patient currently wears glasses  Dietary issues and exercise activities discussed: Current Exercise Habits: Home exercise routine, Type of exercise: walking, Time (Minutes): 30, Frequency (Times/Week): 7, Weekly Exercise (Minutes/Week): 210, Intensity: Moderate, Exercise limited by: None identified   Goals Addressed             This Visit's Progress    Blood Pressure < 140/90       Prevent falls         Depression Screen PHQ 2/9 Scores 06/26/2020 10/29/2019 10/17/2017 04/11/2017 01/06/2017 10/11/2016 05/07/2016  PHQ - 2 Score 0 0 0 0 0 0 0  PHQ- 9 Score - - - - - - 0    Fall Risk Fall Risk  06/26/2020 10/29/2019 12/06/2018 10/17/2017 04/11/2017  Falls in the past year? 0 0 0 No No  Comment - - Emmi Telephone Survey: data to providers prior to load - -  Number falls in past yr: 0 0 - - -  Injury with Fall? 0 - - - -  Risk for fall due to : No Fall Risks - - - -  Follow up Falls evaluation  completed;Falls prevention discussed Falls evaluation completed - - -    FALL RISK PREVENTION PERTAINING TO THE HOME:  Any stairs in or around the home? Yes  If so, are there any without handrails? No  Home free of loose throw rugs in walkways, pet beds, electrical cords, etc? Yes  Adequate lighting in your home to reduce risk of falls? Yes   ASSISTIVE DEVICES UTILIZED TO PREVENT FALLS:  Life alert? No  Use of a cane, walker or w/c? No  Grab bars in the bathroom? No  Shower chair or bench in shower? No  Elevated toilet seat or a handicapped toilet? No   Cognitive Function:    Normal cognitive status assessed by direct observation by this Nurse Health Advisor. No abnormalities found.      Immunizations Immunization History  Administered Date(s) Administered   Fluad Quad(high Dose 65+) 10/17/2018, 10/29/2019   Influenza Whole 11/22/2002, 11/07/2003, 10/06/2009   Influenza, High Dose Seasonal PF 10/17/2017   Influenza,inj,Quad PF,6+ Mos 11/02/2012, 10/01/2013, 10/04/2014, 10/03/2015, 10/11/2016   PFIZER(Purple Top)SARS-COV-2 Vaccination 02/20/2019, 03/17/2019, 11/10/2019   Pneumococcal Conjugate-13 01/26/2013   Pneumococcal Polysaccharide-23 10/27/1998, 03/31/2015   Td 03/27/2009   Tdap 05/24/2011   Zoster, Live 01/03/2012    TDAP status: Up to date  Flu Vaccine status: Up to date  Pneumococcal vaccine status: Up to date  Covid-19 vaccine status: Completed vaccines  Qualifies for Shingles Vaccine? Yes   Zostavax completed Yes   Shingrix Completed?: No.    Education has been provided regarding the importance of this vaccine. Patient has been advised to call insurance company to determine out of pocket expense if they have not yet received this vaccine. Advised may also receive vaccine at local pharmacy or Health Dept. Verbalized acceptance and understanding.  Screening Tests Health Maintenance  Topic Date Due   Hepatitis C Screening  Never done   Zoster Vaccines-  Shingrix (1 of 2) Never done   COVID-19 Vaccine (4 - Booster for Pfizer series) 03/10/2020   INFLUENZA VACCINE  08/11/2020   HEMOGLOBIN A1C  09/12/2020   FOOT EXAM  10/28/2020   OPHTHALMOLOGY EXAM  03/06/2021   TETANUS/TDAP  05/23/2021   PNA vac Low Risk Adult  Completed   HPV VACCINES  Aged Out    Health Maintenance  Health Maintenance Due  Topic Date Due   Hepatitis C Screening  Never done   Zoster Vaccines- Shingrix (1 of 2) Never done   COVID-19 Vaccine (4 - Booster for Pfizer series) 03/10/2020    Colorectal cancer screening: No longer required.   Lung Cancer Screening: (Low Dose CT Chest recommended if Age 85-80 years, 30 pack-year currently smoking OR have quit w/in 15years.) does not qualify.   Lung Cancer Screening Referral: N/A   Additional Screening:  Hepatitis C Screening: does qualify;   Vision Screening: Recommended annual ophthalmology exams for early detection of glaucoma and other disorders of the eye. Is the patient up to date with their annual eye exam?  Yes  Who is the provider or what is the name of the office in which the patient attends annual eye exams? Dr. Delman Cheadle  If pt is not established with a provider, would they like to be referred to a provider to establish care? No .   Dental Screening: Recommended annual dental exams for proper oral hygiene  Community Resource Referral / Chronic Care Management: CRR required this visit?  No   CCM required this visit?  No      Plan:  I have personally reviewed and noted the following in the patient's chart:   Medical and social history Use of alcohol, tobacco or illicit drugs  Current medications and supplements including opioid prescriptions. Patient is not currently taking opioid prescriptions. Functional ability and status Nutritional status Physical activity Advanced directives List of other physicians Hospitalizations, surgeries, and ER visits in previous 12 months Vitals Screenings to  include cognitive, depression, and falls Referrals and appointments  In addition, I have reviewed and discussed with patient certain preventive protocols, quality metrics, and best practice recommendations. A written personalized care plan for preventive services as well as general preventive health recommendations were provided to patient.     Ofilia Neas, LPN   06/24/4313   Nurse Notes: None

## 2020-06-26 ENCOUNTER — Ambulatory Visit (INDEPENDENT_AMBULATORY_CARE_PROVIDER_SITE_OTHER): Payer: Medicare Other

## 2020-06-26 ENCOUNTER — Other Ambulatory Visit: Payer: Self-pay

## 2020-06-26 DIAGNOSIS — Z0000 Encounter for general adult medical examination without abnormal findings: Secondary | ICD-10-CM | POA: Diagnosis not present

## 2020-06-26 NOTE — Patient Instructions (Signed)
[name] , Thank you for taking time to come for your Medicare Wellness Visit. I appreciate your ongoing commitment to your health goals. Please review the following plan we discussed and let me know if I can assist you in the future.   Screening recommendations/referrals: Colonoscopy: No longer required Recommended yearly ophthalmology/optometry visit for glaucoma screening and checkup Recommended yearly dental visit for hygiene and checkup  Vaccinations: Influenza vaccine: Up to date, next due fall 2022  Pneumococcal vaccine: Completed series  Tdap vaccine: Up to date, next due 05/23/2021 Shingles vaccine: Currently due for Shingrix, if you would like to receive we recommend that you do so at your local pharmacy     Advanced directives: Please bring in a copy of your advanced medical directives so that we may scan it into your chart.  Conditions/risks identified: None   Next appointment: None   Preventive Care 65 Years and Older, Male Preventive care refers to lifestyle choices and visits with your health care provider that can promote health and wellness. What does preventive care include? A yearly physical exam. This is also called an annual well check. Dental exams once or twice a year. Routine eye exams. Ask your health care provider how often you should have your eyes checked. Personal lifestyle choices, including: Daily care of your teeth and gums. Regular physical activity. Eating a healthy diet. Avoiding tobacco and drug use. Limiting alcohol use. Practicing safe sex. Taking low doses of aspirin every day. Taking vitamin and mineral supplements as recommended by your health care provider. What happens during an annual well check? The services and screenings done by your health care provider during your annual well check will depend on your age, overall health, lifestyle risk factors, and family history of disease. Counseling  Your health care provider may ask you  questions about your: Alcohol use. Tobacco use. Drug use. Emotional well-being. Home and relationship well-being. Sexual activity. Eating habits. History of falls. Memory and ability to understand (cognition). Work and work Statistician. Screening  You may have the following tests or measurements: Height, weight, and BMI. Blood pressure. Lipid and cholesterol levels. These may be checked every 5 years, or more frequently if you are over 10 years old. Skin check. Lung cancer screening. You may have this screening every year starting at age 79 if you have a 30-pack-year history of smoking and currently smoke or have quit within the past 15 years. Fecal occult blood test (FOBT) of the stool. You may have this test every year starting at age 79 Flexible sigmoidoscopy or colonoscopy. You may have a sigmoidoscopy every 5 years or a colonoscopy every 10 years starting at age 79 Prostate cancer screening. Recommendations will vary depending on your family history and other risks. Hepatitis C blood test. Hepatitis B blood test. Sexually transmitted disease (STD) testing. Diabetes screening. This is done by checking your blood sugar (glucose) after you have not eaten for a while (fasting). You may have this done every 1-3 years. Abdominal aortic aneurysm (AAA) screening. You may need this if you are a current or former smoker. Osteoporosis. You may be screened starting at age 79 if you are at high risk. Talk with your health care provider about your test results, treatment options, and if necessary, the need for more tests. Vaccines  Your health care provider may recommend certain vaccines, such as: Influenza vaccine. This is recommended every year. Tetanus, diphtheria, and acellular pertussis (Tdap, Td) vaccine. You may need a Td booster every 10 years.  Zoster vaccine. You may need this after age 52. Pneumococcal 13-valent conjugate (PCV13) vaccine. One dose is recommended after age  79 Pneumococcal polysaccharide (PPSV23) vaccine. One dose is recommended after age 79 Talk to your health care provider about which screenings and vaccines you need and how often you need them. This information is not intended to replace advice given to you by your health care provider. Make sure you discuss any questions you have with your health care provider. Document Released: 01/24/2015 Document Revised: 09/17/2015 Document Reviewed: 10/29/2014 Elsevier Interactive Patient Education  2017 [place] Prevention in the Home Falls can cause injuries. They can happen to people of all ages. There are many things you can do to make your home safe and to help prevent falls. What can I do on the outside of my home? Regularly fix the edges of walkways and driveways and fix any cracks. Remove anything that might make you trip as you walk through a door, such as a raised step or threshold. Trim any bushes or trees on the path to your home. Use bright outdoor lighting. Clear any walking paths of anything that might make someone trip, such as rocks or tools. Regularly check to see if handrails are loose or broken. Make sure that both sides of any steps have handrails. Any raised decks and porches should have guardrails on the edges. Have any leaves, snow, or ice cleared regularly. Use sand or salt on walking paths during winter. Clean up any spills in your garage right away. This includes oil or grease spills. What can I do in the bathroom? Use night lights. Install grab bars by the toilet and in the tub and shower. Do not use towel bars as grab bars. Use non-skid mats or decals in the tub or shower. If you need to sit down in the shower, use a plastic, non-slip stool. Keep the floor dry. Clean up any water that spills on the floor as soon as it happens. Remove soap buildup in the tub or shower regularly. Attach bath mats securely with double-sided non-slip rug tape. Do not have throw  rugs and other things on the floor that can make you trip. What can I do in the bedroom? Use night lights. Make sure that you have a light by your bed that is easy to reach. Do not use any sheets or blankets that are too big for your bed. They should not hang down onto the floor. Have a firm chair that has side arms. You can use this for support while you get dressed. Do not have throw rugs and other things on the floor that can make you trip. What can I do in the kitchen? Clean up any spills right away. Avoid walking on wet floors. Keep items that you use a lot in easy-to-reach places. If you need to reach something above you, use a strong step stool that has a grab bar. Keep electrical cords out of the way. Do not use floor polish or wax that makes floors slippery. If you must use wax, use non-skid floor wax. Do not have throw rugs and other things on the floor that can make you trip. What can I do with my stairs? Do not leave any items on the stairs. Make sure that there are handrails on both sides of the stairs and use them. Fix handrails that are broken or loose. Make sure that handrails are as long as the stairways. Check any carpeting to make sure that  it is firmly attached to the stairs. Fix any carpet that is loose or worn. Avoid having throw rugs at the top or bottom of the stairs. If you do have throw rugs, attach them to the floor with carpet tape. Make sure that you have a light switch at the top of the stairs and the bottom of the stairs. If you do not have them, ask someone to add them for you. What else can I do to help prevent falls? Wear shoes that: Do not have high heels. Have rubber bottoms. Are comfortable and fit you well. Are closed at the toe. Do not wear sandals. If you use a stepladder: Make sure that it is fully opened. Do not climb a closed stepladder. Make sure that both sides of the stepladder are locked into place. Ask someone to hold it for you, if  possible. Clearly mark and make sure that you can see: Any grab bars or handrails. First and last steps. Where the edge of each step is. Use tools that help you move around (mobility aids) if they are needed. These include: Canes. Walkers. Scooters. Crutches. Turn on the lights when you go into a dark area. Replace any light bulbs as soon as they burn out. Set up your furniture so you have a clear path. Avoid moving your furniture around. If any of your floors are uneven, fix them. If there are any pets around you, be aware of where they are. Review your medicines with your doctor. Some medicines can make you feel dizzy. This can increase your chance of falling. Ask your doctor what other things that you can do to help prevent falls. This information is not intended to replace advice given to you by your health care provider. Make sure you discuss any questions you have with your health care provider. Document Released: 10/24/2008 Document Revised: 06/05/2015 Document Reviewed: 02/01/2014 Elsevier Interactive Patient Education  2017 [employer].

## 2020-08-06 ENCOUNTER — Other Ambulatory Visit: Payer: Self-pay | Admitting: *Deleted

## 2020-08-06 DIAGNOSIS — E782 Mixed hyperlipidemia: Secondary | ICD-10-CM

## 2020-08-06 MED ORDER — CLONIDINE HCL 0.2 MG PO TABS: 0.2000 mg | ORAL_TABLET | Freq: Two times a day (BID) | ORAL | 3 refills | Status: DC

## 2020-08-08 ENCOUNTER — Other Ambulatory Visit: Payer: Self-pay | Admitting: *Deleted

## 2020-08-08 DIAGNOSIS — E782 Mixed hyperlipidemia: Secondary | ICD-10-CM

## 2020-08-08 MED ORDER — BENAZEPRIL HCL 40 MG PO TABS: 40.0000 mg | ORAL_TABLET | Freq: Every day | ORAL | 3 refills | Status: DC

## 2020-09-05 ENCOUNTER — Other Ambulatory Visit: Payer: Self-pay

## 2020-09-05 ENCOUNTER — Other Ambulatory Visit: Payer: Medicare Other

## 2020-09-05 DIAGNOSIS — Z136 Encounter for screening for cardiovascular disorders: Secondary | ICD-10-CM | POA: Diagnosis not present

## 2020-09-05 DIAGNOSIS — E119 Type 2 diabetes mellitus without complications: Secondary | ICD-10-CM | POA: Diagnosis not present

## 2020-09-05 DIAGNOSIS — E782 Mixed hyperlipidemia: Secondary | ICD-10-CM | POA: Diagnosis not present

## 2020-09-05 DIAGNOSIS — Z13220 Encounter for screening for lipoid disorders: Secondary | ICD-10-CM

## 2020-09-05 DIAGNOSIS — I10 Essential (primary) hypertension: Secondary | ICD-10-CM

## 2020-09-05 DIAGNOSIS — Z1159 Encounter for screening for other viral diseases: Secondary | ICD-10-CM

## 2020-09-08 ENCOUNTER — Ambulatory Visit (INDEPENDENT_AMBULATORY_CARE_PROVIDER_SITE_OTHER): Payer: Medicare Other | Admitting: Family Medicine

## 2020-09-08 ENCOUNTER — Other Ambulatory Visit: Payer: Self-pay

## 2020-09-08 ENCOUNTER — Encounter: Payer: Self-pay | Admitting: Family Medicine

## 2020-09-08 VITALS — BP 118/64 | HR 60 | Temp 98.4°F | Resp 14 | Ht 68.5 in | Wt 156.0 lb

## 2020-09-08 DIAGNOSIS — I5189 Other ill-defined heart diseases: Secondary | ICD-10-CM | POA: Diagnosis not present

## 2020-09-08 DIAGNOSIS — I255 Ischemic cardiomyopathy: Secondary | ICD-10-CM | POA: Diagnosis not present

## 2020-09-08 DIAGNOSIS — D519 Vitamin B12 deficiency anemia, unspecified: Secondary | ICD-10-CM

## 2020-09-08 DIAGNOSIS — I10 Essential (primary) hypertension: Secondary | ICD-10-CM

## 2020-09-08 DIAGNOSIS — E119 Type 2 diabetes mellitus without complications: Secondary | ICD-10-CM | POA: Diagnosis not present

## 2020-09-08 DIAGNOSIS — Z8679 Personal history of other diseases of the circulatory system: Secondary | ICD-10-CM | POA: Diagnosis not present

## 2020-09-08 DIAGNOSIS — D649 Anemia, unspecified: Secondary | ICD-10-CM

## 2020-09-08 LAB — LIPID PANEL
Cholesterol: 122 mg/dL (ref <200)
HDL: 61 mg/dL (ref >=40)
LDL Cholesterol (Calc): 49 mg/dL (calc)
Non-HDL Cholesterol (Calc): 61 mg/dL (calc) (ref <130)
Total CHOL/HDL Ratio: 2 (calc) (ref <5.0)
Triglycerides: 43 mg/dL (ref <150)

## 2020-09-08 LAB — CBC WITH DIFFERENTIAL/PLATELET
Absolute Monocytes: 490 cells/uL (ref 200–950)
Basophils Absolute: 50 cells/uL (ref 0–200)
Basophils Relative: 0.7 %
Eosinophils Absolute: 298 cells/uL (ref 15–500)
Eosinophils Relative: 4.2 %
HCT: 34.8 % — ABNORMAL LOW (ref 38.5–50.0)
Hemoglobin: 11.2 g/dL — ABNORMAL LOW (ref 13.2–17.1)
Lymphs Abs: 1832 cells/uL (ref 850–3900)
MCH: 31.3 pg (ref 27.0–33.0)
MCHC: 32.2 g/dL (ref 32.0–36.0)
MCV: 97.2 fL (ref 80.0–100.0)
MPV: 12.2 fL (ref 7.5–12.5)
Monocytes Relative: 6.9 %
Neutro Abs: 4430 cells/uL (ref 1500–7800)
Neutrophils Relative %: 62.4 %
Platelets: 197 10*3/uL (ref 140–400)
RBC: 3.58 10*6/uL — ABNORMAL LOW (ref 4.20–5.80)
RDW: 14 % (ref 11.0–15.0)
Total Lymphocyte: 25.8 %
WBC: 7.1 10*3/uL (ref 3.8–10.8)

## 2020-09-08 LAB — HEPATITIS C ANTIBODY
Hepatitis C Ab: NONREACTIVE
SIGNAL TO CUT-OFF: 0.01 (ref <1.00)

## 2020-09-08 LAB — COMPLETE METABOLIC PANEL WITH GFR
AG Ratio: 2.1 (calc) (ref 1.0–2.5)
ALT: 12 U/L (ref 9–46)
AST: 16 U/L (ref 10–35)
Albumin: 4.1 g/dL (ref 3.6–5.1)
Alkaline phosphatase (APISO): 56 U/L (ref 35–144)
BUN: 16 mg/dL (ref 7–25)
CO2: 23 mmol/L (ref 20–32)
Calcium: 9.5 mg/dL (ref 8.6–10.3)
Chloride: 108 mmol/L (ref 98–110)
Creat: 1.08 mg/dL (ref 0.70–1.28)
Globulin: 2 g/dL (calc) (ref 1.9–3.7)
Glucose, Bld: 96 mg/dL (ref 65–99)
Potassium: 4.1 mmol/L (ref 3.5–5.3)
Sodium: 141 mmol/L (ref 135–146)
Total Bilirubin: 0.7 mg/dL (ref 0.2–1.2)
Total Protein: 6.1 g/dL (ref 6.1–8.1)
eGFR: 70 mL/min/{1.73_m2} (ref >=60)

## 2020-09-08 LAB — HEMOGLOBIN A1C
Hgb A1c MFr Bld: 5.4 % of total Hgb (ref <5.7)
Mean Plasma Glucose: 108 mg/dL
eAG (mmol/L): 6 mmol/L

## 2020-09-08 MED ORDER — FUROSEMIDE 40 MG PO TABS: 40.0000 mg | ORAL_TABLET | Freq: Every day | ORAL | 3 refills | Status: DC

## 2020-09-08 NOTE — Progress Notes (Signed)
Subjective:    Patient ID: Donald Rios, male    DOB: [birth year], 80 y.o.   MRN: [id number]  Patient is a very pleasant 80 year old Caucasian male here today for a checkup.  He has a history of coronary artery disease, diabetes mellitus, as well as PMR.  He denies any chest pain shortness of breath or dyspnea on exertion.  He denies any polyuria, polydipsia, blurry vision.  Most recent lab work is listed below.  Hemoglobin A1c is outstanding at 5.4.  Renal function is stable.  LDL cholesterol is well below 70 at 49.  Weight is stable at 156 lbs. labs do show a nonspecific anemia.  He believes that he was told that he had low B12 in the past and he has been taking B12 supplements but there has been no improvements in his hemoglobin.  Wt Readings from Last 3 Encounters:  05/22/20 154 lb (69.9 kg)  03/10/20 154 lb (69.9 kg)  01/21/20 150 lb (68 kg)    Lab on 09/05/2020  Component Date Value Ref Range Status   WBC 09/05/2020 7.1  3.8 - 10.8 Thousand/uL Final   RBC 09/05/2020 3.58 (A) 4.20 - 5.80 Million/uL Final   Hemoglobin 09/05/2020 11.2 (A) 13.2 - 17.1 g/dL Final   HCT 09/05/2020 34.8 (A) 38.5 - 50.0 % Final   MCV 09/05/2020 97.2  80.0 - 100.0 fL Final   MCH 09/05/2020 31.3  27.0 - 33.0 pg Final   MCHC 09/05/2020 32.2  32.0 - 36.0 g/dL Final   RDW 09/05/2020 14.0  11.0 - 15.0 % Final   Platelets 09/05/2020 197  140 - 400 Thousand/uL Final   MPV 09/05/2020 12.2  7.5 - 12.5 fL Final   Neutro Abs 09/05/2020 4,430  1,500 - 7,800 cells/uL Final   Lymphs Abs 09/05/2020 1,832  850 - 3,900 cells/uL Final   Absolute Monocytes 09/05/2020 490  200 - 950 cells/uL Final   Eosinophils Absolute 09/05/2020 298  15 - 500 cells/uL Final   Basophils Absolute 09/05/2020 50  0 - 200 cells/uL Final   Neutrophils Relative % 09/05/2020 62.4  % Final   Total Lymphocyte 09/05/2020 25.8  % Final   Monocytes Relative 09/05/2020 6.9  % Final   Eosinophils Relative 09/05/2020 4.2  % Final   Basophils Relative  09/05/2020 0.7  % Final   Glucose, Bld 09/05/2020 96  65 - 99 mg/dL Final   Comment: .            Fasting reference interval .    BUN 09/05/2020 16  7 - 25 mg/dL Final   Creat 09/05/2020 1.08  0.70 - 1.28 mg/dL Final   eGFR 09/05/2020 70  > OR = 60 mL/min/1.36m Final   Comment: The eGFR is based on the CKD-EPI 2021 equation. To calculate  the new eGFR from a previous Creatinine or Cystatin C result, go to https://www.kidney.org/professionals/ kdoqi/gfr%5Fcalculator    BUN/Creatinine Ratio 040/98/1191NOT APPLICABLE  6 - 22 (calc) Final   Sodium 09/05/2020 141  135 - 146 mmol/L Final   Potassium 09/05/2020 4.1  3.5 - 5.3 mmol/L Final   Chloride 09/05/2020 108  98 - 110 mmol/L Final   CO2 09/05/2020 23  20 - 32 mmol/L Final   Calcium 09/05/2020 9.5  8.6 - 10.3 mg/dL Final   Total Protein 09/05/2020 6.1  6.1 - 8.1 g/dL Final   Albumin 09/05/2020 4.1  3.6 - 5.1 g/dL Final   Globulin 09/05/2020 2.0  1.9 - 3.7 g/dL (calc) Final  AG Ratio 09/05/2020 2.1  1.0 - 2.5 (calc) Final   Total Bilirubin 09/05/2020 0.7  0.2 - 1.2 mg/dL Final   Alkaline phosphatase (APISO) 09/05/2020 56  35 - 144 U/L Final   AST 09/05/2020 16  10 - 35 U/L Final   ALT 09/05/2020 12  9 - 46 U/L Final   Hgb A1c MFr Bld 09/05/2020 5.4  <5.7 % of total Hgb Final   Comment: For the purpose of screening for the presence of diabetes: . <5.7%       Consistent with the absence of diabetes 5.7-6.4%    Consistent with increased risk for diabetes             (prediabetes) > or =6.5%  Consistent with diabetes . This assay result is consistent with a decreased risk of diabetes. . Currently, no consensus exists regarding use of hemoglobin A1c for diagnosis of diabetes in children. . According to American Diabetes Association (ADA) guidelines, hemoglobin A1c <7.0% represents optimal control in non-pregnant diabetic patients. Different metrics may apply to specific patient populations.  Standards of Medical Care in  Diabetes(ADA). .    Mean Plasma Glucose 09/05/2020 108  mg/dL Final   eAG (mmol/L) 09/05/2020 6.0  mmol/L Final   Cholesterol 09/05/2020 122  <200 mg/dL Final   HDL 09/05/2020 61  > OR = 40 mg/dL Final   Triglycerides 09/05/2020 43  <150 mg/dL Final   LDL Cholesterol (Calc) 09/05/2020 49  mg/dL (calc) Final   Comment: Reference range: <100 . Desirable range <100 mg/dL for primary prevention;   <70 mg/dL for patients with CHD or diabetic patients  with > or = 2 CHD risk factors. Marland Kitchen LDL-C is now calculated using the Martin-Hopkins  calculation, which is a validated novel method providing  better accuracy than the Friedewald equation in the  estimation of LDL-C.  Cresenciano Genre et al. Annamaria Helling. 1610;960(45): 2061-2068  (http://education.[facility].com/faq/FAQ164)    Total CHOL/HDL Ratio 09/05/2020 2.0  <5.0 (calc) Final   Non-HDL Cholesterol (Calc) 09/05/2020 61  <130 mg/dL (calc) Final   Comment: For patients with diabetes plus 1 major ASCVD risk  factor, treating to a non-HDL-C goal of <100 mg/dL  (LDL-C of <70 mg/dL) is considered a therapeutic  option.     Past Medical History:  Diagnosis Date   Acute gastric ulcer with hemorrhage 05/06/2013   Arthritis    Asthma    Atrial enlargement, left    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    Cataract    COPD (chronic obstructive pulmonary disease) (HCC)    Diabetes mellitus    Diabetic nephropathy ([place])    Diverticulosis    ED (erectile dysfunction)    GERD (gastroesophageal reflux disease)    Gout    Hearing loss    Hiatal hernia 05/2008   EGD with HH and reflux esophagitis.    History of MI (myocardial infarction)    History of nuclear stress test 08/07/2010   dipyridamole; EKG negative for ischemia, low risk scan    Hyperlipidemia    Hyperplastic colon polyp 05/2008   Hypertension    Ischemic cardiomyopathy    EF 45%, with inferior wall motion abnormality    Myocardial infarction ([place])    Valvular regurgitation     mitral and tricuspid (mild)   Past Surgical History:  Procedure Laterality Date   CORONARY ARTERY BYPASS GRAFT  2000   LIMA to LAD, free IMA to OM2, sequential graft to PLA & PLD   ESOPHAGOGASTRODUODENOSCOPY N/A  05/07/2013   Procedure: ESOPHAGOGASTRODUODENOSCOPY (EGD);  Surgeon: Gatha Mayer, MD;  Location: [facility] ENDOSCOPY;  Service: Endoscopy;  Laterality: N/A;   TRANSTHORACIC ECHOCARDIOGRAM  09/07/2010   EF 25-42%; LV systolic function mildly reduced; LA mildly dilated; mild-mod MR & mild-mod TR; aortic root sclerosis/calcification;    Current Outpatient Medications on File Prior to Visit  Medication Sig Dispense Refill   allopurinol (ZYLOPRIM) 300 MG tablet Take 1 tablet (300 mg total) by mouth daily. 90 tablet 1   amLODipine (NORVASC) 5 MG tablet Take 2 tablets (10 mg total) by mouth daily. 180 tablet 3   aspirin 81 MG tablet Take 81 mg by mouth daily.     benazepril (LOTENSIN) 40 MG tablet Take 1 tablet (40 mg total) by mouth daily. 90 tablet 3   hydrochlorothiazide (HYDRODIURIL) 25 MG tablet Take 1 tablet (25 mg total) by mouth daily. 90 tablet 3   metoprolol tartrate (LOPRESSOR) 50 MG tablet TAKE ONE AND ONE-HALF (1 & 1/2) TABLET BY MOUTH TWO TIMES A DAY 270 tablet 1   mirtazapine (REMERON) 30 MG tablet TAKE ONE TABLET BY MOUTH AT BEDTIME FOR APPETITE 30 tablet 4   simvastatin (ZOCOR) 40 MG tablet Take 1 tablet (40 mg total) by mouth daily. 90 tablet 3   No current facility-administered medications on file prior to visit.   No Known Allergies Social History   Socioeconomic History   Marital status: Married    Spouse name: Not on file   Number of children: 1   Years of education: Not on file   Highest education level: Not on file  Occupational History   Not on file  Tobacco Use   Smoking status: Former    Types: Cigarettes    Quit date: 11/04/1985    Years since quitting: 34.8   Smokeless tobacco: Never  Substance and Sexual Activity   Alcohol use: Yes     Alcohol/week: 2.0 - 3.0 standard drinks    Types: 2 - 3 Cans of beer per week   Drug use: No   Sexual activity: Not on file  Other Topics Concern   Not on file  Social History Narrative   Not on file   Social Determinants of Health   Financial Resource Strain: Low Risk    Difficulty of Paying Living Expenses: Not hard at all  Food Insecurity: No Food Insecurity   Worried About Charity fundraiser in the Last Year: Never true   [place] in the Last Year: Never true  Transportation Needs: No Transportation Needs   Lack of Transportation (Medical): No   Lack of Transportation (Non-Medical): No  Physical Activity: Sufficiently Active   Days of Exercise per Week: 7 days   Minutes of Exercise per Session: 30 min  Stress: No Stress Concern Present   Feeling of Stress : Not at all  Social Connections: Moderately Integrated   Frequency of Communication with Friends and Family: More than three times a week   Frequency of Social Gatherings with Friends and Family: Three times a week   Attends Religious Services: 1 to 4 times per year   Active Member of Clubs or Organizations: No   Attends Archivist Meetings: Never   Marital Status: Married  Human resources officer Violence: Not At Risk   Fear of Current or Ex-Partner: No   Emotionally Abused: No   Physically Abused: No   Sexually Abused: No     Review of Systems  All other systems reviewed and  are negative.     Objective:   Physical Exam Vitals reviewed.  Constitutional:      Appearance: He is well-developed.  Neck:     Thyroid: No thyromegaly.     Vascular: No JVD.  Cardiovascular:     Rate and Rhythm: Normal rate and regular rhythm.     Heart sounds: Normal heart sounds. No murmur heard. Pulmonary:     Effort: Pulmonary effort is normal. No respiratory distress.     Breath sounds: Normal breath sounds. No wheezing or rales.  Chest:     Chest wall: No tenderness.  Abdominal:     General: Bowel sounds are  normal. There is no distension.     Palpations: Abdomen is soft. There is no mass.     Tenderness: There is no abdominal tenderness. There is no guarding or rebound.  Musculoskeletal:     Cervical back: Neck supple.     Right lower leg: Edema present.     Left lower leg: Edema present.  Lymphadenopathy:     Cervical: No cervical adenopathy.  Skin:    Findings: No rash.          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  We will decided to discontinue hydrochlorothiazide and replace with Lasix 40 mg a day in addition to compression hose.  We also discussed discontinuing amlodipine however he likes the way the amlodipine is managing his blood pressure compared to the clonidine and therefore he would like to stay on it.  His diabetes is well controlled as is his cholesterol.  Given his anemia and potential history of B12 deficiency I will check an iron, TIBC, ferritin, B12, and I will check his stool for blood to determine if this is anemia of chronic disease or evidence of bleeding or a vitamin or mineral deficiency

## 2020-09-09 LAB — IRON,TIBC AND FERRITIN PANEL
%SAT: 41 % (calc) (ref 20–48)
Ferritin: 407 ng/mL — ABNORMAL HIGH (ref 24–380)
Iron: 107 ug/dL (ref 50–180)
TIBC: 263 mcg/dL (calc) (ref 250–425)

## 2020-09-09 LAB — VITAMIN B12: Vitamin B-12: 1401 pg/mL — ABNORMAL HIGH (ref 200–1100)

## 2020-09-16 ENCOUNTER — Encounter: Payer: Medicare Other | Admitting: Family Medicine

## 2020-10-02 ENCOUNTER — Telehealth: Payer: Self-pay | Admitting: Pharmacist

## 2020-10-02 NOTE — Progress Notes (Signed)
    Chronic Care Management Pharmacy Assistant   Name: Donald Rios  MRN: [id number] DOB: Dec 17, 1940  Reason for Encounter: Disease State For DM.   Conditions to be addressed/monitored: CAD, HTN, HF, Type II DM, HLD, Gout  Recent office visits:  09/08/20 Dr. Dennard Schaumann For follow-up. STARTED furosemide 40 mg daily.   Recent consult visits:  None since 06/18/20  Hospital visits:  None since 06/18/20  Medications: Outpatient Encounter Medications as of 10/02/2020  Medication Sig   allopurinol (ZYLOPRIM) 300 MG tablet Take 1 tablet (300 mg total) by mouth daily.   amLODipine (NORVASC) 5 MG tablet Take 2 tablets (10 mg total) by mouth daily.   aspirin 81 MG tablet Take 81 mg by mouth daily.   benazepril (LOTENSIN) 40 MG tablet Take 1 tablet (40 mg total) by mouth daily.   furosemide (LASIX) 40 MG tablet Take 1 tablet (40 mg total) by mouth daily.   hydrochlorothiazide (HYDRODIURIL) 25 MG tablet Take 1 tablet (25 mg total) by mouth daily.   metoprolol tartrate (LOPRESSOR) 50 MG tablet TAKE ONE AND ONE-HALF (1 & 1/2) TABLET BY MOUTH TWO TIMES A DAY   mirtazapine (REMERON) 30 MG tablet TAKE ONE TABLET BY MOUTH AT BEDTIME FOR APPETITE   simvastatin (ZOCOR) 40 MG tablet Take 1 tablet (40 mg total) by mouth daily.   vitamin B-12 (CYANOCOBALAMIN) 1000 MCG tablet Take 1,000 mcg by mouth daily.   No facility-administered encounter medications on file as of 10/02/2020.   Recent Relevant Labs: Lab Results  Component Value Date/Time   HGBA1C 5.4 09/05/2020 09:38 AM   HGBA1C 5.6 03/12/2020 08:38 AM   MICROALBUR 0.5 03/12/2020 08:38 AM   MICROALBUR 0.4 04/08/2017 08:23 AM    Kidney Function Lab Results  Component Value Date/Time   CREATININE 1.08 09/05/2020 09:38 AM   CREATININE 1.07 03/12/2020 08:38 AM   GFRNONAA 62 09/13/2019 09:07 AM   GFRAA 72 09/13/2019 09:07 AM    Current antihyperglycemic regimen:  None.   What recent interventions/DTPs have been made to improve glycemic control:   None.  Have there been any recent hospitalizations or ED visits since last visit with CPP? Patient stated no.   Patient denies hypoglycemic symptoms, including None  Patient denies hyperglycemic symptoms, including none  How often are you checking your blood sugar? Patient stated he doesn't check his blood pressure regularly.   What are your blood sugars ranging?  108-2 weeks ago  During the week, how often does your blood glucose drop below 70? Patient stated Never.  Are you checking your feet daily/regularly?  Patient reminded to monitor his feet regularly.   Adherence Review: Is the patient currently on a STATIN medication?  Simvastatin 40 mg   Is the patient currently on ACE/ARB medication? Benazepril 40 mg  Does the patient have >5 day gap between last estimated fill dates? Per misc rpts, no.  Care Gaps:None.   Star Rating Drugs:Benazepril 40 mg 08/08/20 90 DS, Simvastatin 40 mg 08/08/20 90 DS.   Follow-Up:Pharmacist Review  Charlann Lange, [place] Pharmacist Assistant [phone]

## 2020-10-08 DIAGNOSIS — Z85820 Personal history of malignant melanoma of skin: Secondary | ICD-10-CM | POA: Diagnosis not present

## 2020-10-08 DIAGNOSIS — Z08 Encounter for follow-up examination after completed treatment for malignant neoplasm: Secondary | ICD-10-CM | POA: Diagnosis not present

## 2020-10-08 DIAGNOSIS — L821 Other seborrheic keratosis: Secondary | ICD-10-CM | POA: Diagnosis not present

## 2020-10-08 DIAGNOSIS — C4359 Malignant melanoma of other part of trunk: Secondary | ICD-10-CM | POA: Diagnosis not present

## 2020-10-08 DIAGNOSIS — L309 Dermatitis, unspecified: Secondary | ICD-10-CM | POA: Diagnosis not present

## 2020-10-08 DIAGNOSIS — D225 Melanocytic nevi of trunk: Secondary | ICD-10-CM | POA: Diagnosis not present

## 2020-10-08 DIAGNOSIS — L304 Erythema intertrigo: Secondary | ICD-10-CM | POA: Diagnosis not present

## 2020-10-08 DIAGNOSIS — L814 Other melanin hyperpigmentation: Secondary | ICD-10-CM | POA: Diagnosis not present

## 2020-11-06 ENCOUNTER — Ambulatory Visit: Payer: Medicare Other | Attending: Internal Medicine

## 2020-11-06 ENCOUNTER — Other Ambulatory Visit (HOSPITAL_BASED_OUTPATIENT_CLINIC_OR_DEPARTMENT_OTHER): Payer: Self-pay

## 2020-11-06 ENCOUNTER — Other Ambulatory Visit: Payer: Self-pay

## 2020-11-06 DIAGNOSIS — Z23 Encounter for immunization: Secondary | ICD-10-CM

## 2020-11-06 MED ORDER — PFIZER COVID-19 VAC BIVALENT 30 MCG/0.3ML IM SUSP
INTRAMUSCULAR | 0 refills | Status: DC
  Filled 2020-11-06: qty 0.3, 1d supply, fill #0

## 2020-11-06 NOTE — Progress Notes (Signed)
   Covid-19 Vaccination Clinic  Name:  Donald Rios    MRN: [id number] DOB: [birth year]  11/06/2020  Mr. Cullers was observed post Covid-19 immunization for 15 minutes without incident. He was provided with Vaccine Information Sheet and instruction to access the V-Safe system.   Mr. Pitter was instructed to call 911 with any severe reactions post vaccine: Difficulty breathing  Swelling of face and throat  A fast heartbeat  A bad rash all over body  Dizziness and weakness   Immunizations Administered     Name Date Dose VIS Date Route   Pfizer Covid-19 Vaccine Bivalent Booster 11/06/2020  1:39 PM 0.3 mL 09/10/2020 Intramuscular   Manufacturer: [place]   Lot: [id number]   [place]: [phone]

## 2020-12-08 ENCOUNTER — Other Ambulatory Visit: Payer: Self-pay

## 2020-12-08 MED ORDER — FUROSEMIDE 40 MG PO TABS: 40.0000 mg | ORAL_TABLET | Freq: Every day | ORAL | 3 refills | Status: DC

## 2020-12-16 ENCOUNTER — Ambulatory Visit (INDEPENDENT_AMBULATORY_CARE_PROVIDER_SITE_OTHER): Payer: Medicare Other

## 2020-12-16 ENCOUNTER — Other Ambulatory Visit: Payer: Self-pay

## 2020-12-16 DIAGNOSIS — Z23 Encounter for immunization: Secondary | ICD-10-CM

## 2021-01-07 ENCOUNTER — Encounter: Payer: Self-pay | Admitting: Family Medicine

## 2021-01-07 DIAGNOSIS — L304 Erythema intertrigo: Secondary | ICD-10-CM | POA: Diagnosis not present

## 2021-01-07 DIAGNOSIS — D225 Melanocytic nevi of trunk: Secondary | ICD-10-CM | POA: Diagnosis not present

## 2021-01-07 DIAGNOSIS — L814 Other melanin hyperpigmentation: Secondary | ICD-10-CM | POA: Diagnosis not present

## 2021-01-07 DIAGNOSIS — D485 Neoplasm of uncertain behavior of skin: Secondary | ICD-10-CM | POA: Diagnosis not present

## 2021-01-07 DIAGNOSIS — C4359 Malignant melanoma of other part of trunk: Secondary | ICD-10-CM | POA: Diagnosis not present

## 2021-01-07 DIAGNOSIS — Z872 Personal history of diseases of the skin and subcutaneous tissue: Secondary | ICD-10-CM | POA: Diagnosis not present

## 2021-01-07 DIAGNOSIS — L309 Dermatitis, unspecified: Secondary | ICD-10-CM | POA: Diagnosis not present

## 2021-01-07 DIAGNOSIS — L820 Inflamed seborrheic keratosis: Secondary | ICD-10-CM | POA: Diagnosis not present

## 2021-01-07 DIAGNOSIS — Z08 Encounter for follow-up examination after completed treatment for malignant neoplasm: Secondary | ICD-10-CM | POA: Diagnosis not present

## 2021-01-07 DIAGNOSIS — Z85820 Personal history of malignant melanoma of skin: Secondary | ICD-10-CM | POA: Diagnosis not present

## 2021-01-07 DIAGNOSIS — L821 Other seborrheic keratosis: Secondary | ICD-10-CM | POA: Diagnosis not present

## 2021-02-10 ENCOUNTER — Ambulatory Visit (INDEPENDENT_AMBULATORY_CARE_PROVIDER_SITE_OTHER): Payer: Medicare Other | Admitting: Family Medicine

## 2021-02-10 ENCOUNTER — Other Ambulatory Visit: Payer: Self-pay

## 2021-02-10 ENCOUNTER — Encounter: Payer: Self-pay | Admitting: Family Medicine

## 2021-02-10 VITALS — BP 122/62 | HR 58 | Temp 96.8°F | Resp 18 | Ht 68.5 in | Wt 151.0 lb

## 2021-02-10 DIAGNOSIS — E861 Hypovolemia: Secondary | ICD-10-CM

## 2021-02-10 DIAGNOSIS — I9589 Other hypotension: Secondary | ICD-10-CM | POA: Diagnosis not present

## 2021-02-10 DIAGNOSIS — R197 Diarrhea, unspecified: Secondary | ICD-10-CM

## 2021-02-10 DIAGNOSIS — R300 Dysuria: Secondary | ICD-10-CM | POA: Diagnosis not present

## 2021-02-10 DIAGNOSIS — E860 Dehydration: Secondary | ICD-10-CM | POA: Diagnosis not present

## 2021-02-10 LAB — URINALYSIS, ROUTINE W REFLEX MICROSCOPIC
Bilirubin Urine: NEGATIVE
Glucose, UA: NEGATIVE
Hyaline Cast: NONE SEEN /LPF
Ketones, ur: NEGATIVE
Nitrite: NEGATIVE
Protein, ur: NEGATIVE
Specific Gravity, Urine: 1.015 (ref 1.001–1.035)
Squamous Epithelial / HPF: NONE SEEN /HPF (ref <=5)
pH: 5.5 (ref 5.0–8.0)

## 2021-02-10 LAB — MICROSCOPIC MESSAGE

## 2021-02-10 MED ORDER — DIPHENOXYLATE-ATROPINE 2.5-0.025 MG PO TABS: 1.0000 | ORAL_TABLET | Freq: Four times a day (QID) | ORAL | 0 refills | Status: DC | PRN

## 2021-02-10 MED ORDER — SULFAMETHOXAZOLE-TRIMETHOPRIM 800-160 MG PO TABS: 1.0000 | ORAL_TABLET | Freq: Two times a day (BID) | ORAL | 0 refills | Status: DC

## 2021-02-10 NOTE — Progress Notes (Signed)
Subjective:    Patient ID: Donald Rios, male    DOB: 09-Apr-1940, 81 y.o.   MRN: [id number]    Wt Readings from Last 3 Encounters:  02/10/21 151 lb (68.5 kg)  09/08/20 156 lb (70.8 kg)  05/22/20 154 lb (69.9 kg)    1 week ago, the patient developed diarrhea after eating in a restaurant.  He reports loose bowel movements as soon as he eats.  This continues to this day.  He states that he has variable appetite.  He feels extremely weak and tired.  He denies any fever or chills.  He denies any nausea or vomiting.  He denies any abdominal pain.  Specifically he denies any left lower quadrant abdominal pain.  He denies any melena or hematochezia.  His watery diarrhea.  His blood pressure at home has been running just above 100.  Diastolic blood pressure has been 40-50.  This is extremely low.  He denies taking Lasix but he is on the remainder of his medications on the medicine list.  I believe that he is likely dehydrated given the fact he has lost 5 pounds since his last visit and he is also hypotensive relatively.  This could be causing the weakness.  He also reports some mild dysuria.  Past Medical History:  Diagnosis Date   Acute gastric ulcer with hemorrhage 05/06/2013   Arthritis    Asthma    Atrial enlargement, left    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    Cataract    COPD (chronic obstructive pulmonary disease) (HCC)    Diabetes mellitus    Diabetic nephropathy ([place])    Diverticulosis    ED (erectile dysfunction)    GERD (gastroesophageal reflux disease)    Gout    Hearing loss    Hiatal hernia 05/2008   EGD with HH and reflux esophagitis.    History of MI (myocardial infarction)    History of nuclear stress test 08/07/2010   dipyridamole; EKG negative for ischemia, low risk scan    Hyperlipidemia    Hyperplastic colon polyp 05/2008   Hypertension    Ischemic cardiomyopathy    EF 45%, with inferior wall motion abnormality    Myocardial infarction ([place])     Valvular regurgitation    mitral and tricuspid (mild)   Past Surgical History:  Procedure Laterality Date   CORONARY ARTERY BYPASS GRAFT  2000   LIMA to LAD, free IMA to OM2, sequential graft to PLA & PLD   ESOPHAGOGASTRODUODENOSCOPY N/A 05/07/2013   Procedure: ESOPHAGOGASTRODUODENOSCOPY (EGD);  Surgeon: Gatha Mayer, MD;  Location: [facility] ENDOSCOPY;  Service: Endoscopy;  Laterality: N/A;   TRANSTHORACIC ECHOCARDIOGRAM  09/07/2010   EF 01-02%; LV systolic function mildly reduced; LA mildly dilated; mild-mod MR & mild-mod TR; aortic root sclerosis/calcification;    Current Outpatient Medications on File Prior to Visit  Medication Sig Dispense Refill   allopurinol (ZYLOPRIM) 300 MG tablet Take 1 tablet (300 mg total) by mouth daily. 90 tablet 1   amLODipine (NORVASC) 5 MG tablet Take 2 tablets (10 mg total) by mouth daily. 180 tablet 3   aspirin 81 MG tablet Take 81 mg by mouth daily.     benazepril (LOTENSIN) 40 MG tablet Take 1 tablet (40 mg total) by mouth daily. 90 tablet 3   furosemide (LASIX) 40 MG tablet Take 1 tablet (40 mg total) by mouth daily. 30 tablet 3   hydrochlorothiazide (HYDRODIURIL) 25 MG tablet Take 1 tablet (25 mg total) by  mouth daily. 90 tablet 3   metoprolol tartrate (LOPRESSOR) 50 MG tablet TAKE ONE AND ONE-HALF (1 & 1/2) TABLET BY MOUTH TWO TIMES A DAY 270 tablet 1   mirtazapine (REMERON) 30 MG tablet TAKE ONE TABLET BY MOUTH AT BEDTIME FOR APPETITE 30 tablet 4   simvastatin (ZOCOR) 40 MG tablet Take 1 tablet (40 mg total) by mouth daily. 90 tablet 3   vitamin B-12 (CYANOCOBALAMIN) 1000 MCG tablet Take 1,000 mcg by mouth daily.     No current facility-administered medications on file prior to visit.   No Known Allergies Social History   Socioeconomic History   Marital status: Married    Spouse name: Not on file   Number of children: 1   Years of education: Not on file   Highest education level: Not on file  Occupational History   Not on file  Tobacco Use    Smoking status: Former    Types: Cigarettes    Quit date: 11/04/1985    Years since quitting: 35.2   Smokeless tobacco: Never  Substance and Sexual Activity   Alcohol use: Yes    Alcohol/week: 2.0 - 3.0 standard drinks    Types: 2 - 3 Cans of beer per week   Drug use: No   Sexual activity: Not on file  Other Topics Concern   Not on file  Social History Narrative   Not on file   Social Determinants of Health   Financial Resource Strain: Low Risk    Difficulty of Paying Living Expenses: Not hard at all  Food Insecurity: No Food Insecurity   Worried About Charity fundraiser in the Last Year: Never true   [place] in the Last Year: Never true  Transportation Needs: No Transportation Needs   Lack of Transportation (Medical): No   Lack of Transportation (Non-Medical): No  Physical Activity: Sufficiently Active   Days of Exercise per Week: 7 days   Minutes of Exercise per Session: 30 min  Stress: No Stress Concern Present   Feeling of Stress : Not at all  Social Connections: Moderately Integrated   Frequency of Communication with Friends and Family: More than three times a week   Frequency of Social Gatherings with Friends and Family: Three times a week   Attends Religious Services: 1 to 4 times per year   Active Member of Clubs or Organizations: No   Attends Archivist Meetings: Never   Marital Status: Married  Human resources officer Violence: Not At Risk   Fear of Current or Ex-Partner: No   Emotionally Abused: No   Physically Abused: No   Sexually Abused: No     Review of Systems  All other systems reviewed and are negative.     Objective:   Physical Exam Vitals reviewed.  Constitutional:      Appearance: He is well-developed.  Neck:     Thyroid: No thyromegaly.     Vascular: No JVD.  Cardiovascular:     Rate and Rhythm: Normal rate and regular rhythm.     Heart sounds: Normal heart sounds. No murmur heard. Pulmonary:     Effort: Pulmonary effort  is normal. No respiratory distress.     Breath sounds: Normal breath sounds. No wheezing or rales.  Chest:     Chest wall: No tenderness.  Abdominal:     General: Bowel sounds are normal. There is no distension.     Palpations: Abdomen is soft. There is no mass.  Tenderness: There is no abdominal tenderness. There is no guarding or rebound.  Musculoskeletal:     Cervical back: Neck supple.  Lymphadenopathy:     Cervical: No cervical adenopathy.  Skin:    Findings: No rash.          Assessment & Plan:  Dysuria - Plan: Urinalysis, Routine w reflex microscopic  Diarrhea, unspecified type  Dehydration  Hypotension due to hypovolemia I believe the patient likely has a viral gastroenteritis.  I believe this is causing his diarrhea.  I believe he is also likely dehydrated given the weight loss and this is causing fatigue due to the hypotension.  Therefore I recommended that he push fluids.  Temporarily discontinue furosemide.  Discontinue benazepril.  He was not supposed to be taking hydrochlorothiazide as we replaced this with furosemide at his last visit.  I will make sure that he hold all 3 of these medications.  Check CBC CMP.  Use Lomotil as needed for diarrhea.  Obtain urinalysis to rule out UTI.  Urinalysis shows +3 leukocyte esterase and trace blood.  Begin Bactrim double strength tablets twice daily for 1 week.  Recheck in 1 week or sooner if worse

## 2021-02-11 LAB — COMPLETE METABOLIC PANEL WITH GFR
AG Ratio: 1.9 (calc) (ref 1.0–2.5)
ALT: 106 U/L — ABNORMAL HIGH (ref 9–46)
AST: 64 U/L — ABNORMAL HIGH (ref 10–35)
Albumin: 3.7 g/dL (ref 3.6–5.1)
Alkaline phosphatase (APISO): 72 U/L (ref 35–144)
BUN/Creatinine Ratio: 23 (calc) — ABNORMAL HIGH (ref 6–22)
BUN: 35 mg/dL — ABNORMAL HIGH (ref 7–25)
CO2: 25 mmol/L (ref 20–32)
Calcium: 8.8 mg/dL (ref 8.6–10.3)
Chloride: 109 mmol/L (ref 98–110)
Creat: 1.52 mg/dL — ABNORMAL HIGH (ref 0.70–1.28)
Globulin: 1.9 g/dL (calc) (ref 1.9–3.7)
Glucose, Bld: 110 mg/dL — ABNORMAL HIGH (ref 65–99)
Potassium: 5.1 mmol/L (ref 3.5–5.3)
Sodium: 142 mmol/L (ref 135–146)
Total Bilirubin: 0.5 mg/dL (ref 0.2–1.2)
Total Protein: 5.6 g/dL — ABNORMAL LOW (ref 6.1–8.1)
eGFR: 46 mL/min/{1.73_m2} — ABNORMAL LOW (ref >=60)

## 2021-02-11 LAB — CBC WITH DIFFERENTIAL/PLATELET
Absolute Monocytes: 677 cells/uL (ref 200–950)
Basophils Absolute: 27 cells/uL (ref 0–200)
Basophils Relative: 0.4 %
Eosinophils Absolute: 402 cells/uL (ref 15–500)
Eosinophils Relative: 6 %
HCT: 30.8 % — ABNORMAL LOW (ref 38.5–50.0)
Hemoglobin: 10.1 g/dL — ABNORMAL LOW (ref 13.2–17.1)
Lymphs Abs: 1487 cells/uL (ref 850–3900)
MCH: 31.5 pg (ref 27.0–33.0)
MCHC: 32.8 g/dL (ref 32.0–36.0)
MCV: 96 fL (ref 80.0–100.0)
MPV: 13.7 fL — ABNORMAL HIGH (ref 7.5–12.5)
Monocytes Relative: 10.1 %
Neutro Abs: 4107 cells/uL (ref 1500–7800)
Neutrophils Relative %: 61.3 %
Platelets: 200 10*3/uL (ref 140–400)
RBC: 3.21 10*6/uL — ABNORMAL LOW (ref 4.20–5.80)
RDW: 13.6 % (ref 11.0–15.0)
Total Lymphocyte: 22.2 %
WBC: 6.7 10*3/uL (ref 3.8–10.8)

## 2021-02-17 ENCOUNTER — Ambulatory Visit (INDEPENDENT_AMBULATORY_CARE_PROVIDER_SITE_OTHER): Payer: Medicare Other | Admitting: Family Medicine

## 2021-02-17 ENCOUNTER — Other Ambulatory Visit: Payer: Self-pay

## 2021-02-17 VITALS — BP 122/52 | HR 57 | Temp 97.2°F | Resp 18 | Ht 68.5 in | Wt 150.0 lb

## 2021-02-17 DIAGNOSIS — E860 Dehydration: Secondary | ICD-10-CM

## 2021-02-17 DIAGNOSIS — I9589 Other hypotension: Secondary | ICD-10-CM

## 2021-02-17 DIAGNOSIS — E861 Hypovolemia: Secondary | ICD-10-CM

## 2021-02-17 LAB — CBC WITH DIFFERENTIAL/PLATELET
Absolute Monocytes: 521 cells/uL (ref 200–950)
Basophils Absolute: 63 cells/uL (ref 0–200)
Basophils Relative: 0.8 %
Eosinophils Absolute: 269 cells/uL (ref 15–500)
Eosinophils Relative: 3.4 %
HCT: 30.7 % — ABNORMAL LOW (ref 38.5–50.0)
Hemoglobin: 9.9 g/dL — ABNORMAL LOW (ref 13.2–17.1)
Lymphs Abs: 1975 cells/uL (ref 850–3900)
MCH: 31.3 pg (ref 27.0–33.0)
MCHC: 32.2 g/dL (ref 32.0–36.0)
MCV: 97.2 fL (ref 80.0–100.0)
MPV: 12.7 fL — ABNORMAL HIGH (ref 7.5–12.5)
Monocytes Relative: 6.6 %
Neutro Abs: 5072 cells/uL (ref 1500–7800)
Neutrophils Relative %: 64.2 %
Platelets: 250 10*3/uL (ref 140–400)
RBC: 3.16 10*6/uL — ABNORMAL LOW (ref 4.20–5.80)
RDW: 13.9 % (ref 11.0–15.0)
Total Lymphocyte: 25 %
WBC: 7.9 10*3/uL (ref 3.8–10.8)

## 2021-02-17 LAB — COMPLETE METABOLIC PANEL WITH GFR
AG Ratio: 2.1 (calc) (ref 1.0–2.5)
ALT: 24 U/L (ref 9–46)
AST: 16 U/L (ref 10–35)
Albumin: 3.9 g/dL (ref 3.6–5.1)
Alkaline phosphatase (APISO): 64 U/L (ref 35–144)
BUN/Creatinine Ratio: 10 (calc) (ref 6–22)
BUN: 17 mg/dL (ref 7–25)
CO2: 22 mmol/L (ref 20–32)
Calcium: 8.9 mg/dL (ref 8.6–10.3)
Chloride: 110 mmol/L (ref 98–110)
Creat: 1.67 mg/dL — ABNORMAL HIGH (ref 0.70–1.28)
Globulin: 1.9 g/dL (calc) (ref 1.9–3.7)
Glucose, Bld: 103 mg/dL — ABNORMAL HIGH (ref 65–99)
Potassium: 5.3 mmol/L (ref 3.5–5.3)
Sodium: 139 mmol/L (ref 135–146)
Total Bilirubin: 0.5 mg/dL (ref 0.2–1.2)
Total Protein: 5.8 g/dL — ABNORMAL LOW (ref 6.1–8.1)
eGFR: 41 mL/min/{1.73_m2} — ABNORMAL LOW (ref >=60)

## 2021-02-17 NOTE — Progress Notes (Signed)
Subjective:    Patient ID: Donald Rios, male    DOB: Jan 06, 1941, 81 y.o.   MRN: [id number]  02/10/21  1 week ago, the patient developed diarrhea after eating in a restaurant.  He reports loose bowel movements as soon as he eats.  This continues to this day.  He states that he has variable appetite.  He feels extremely weak and tired.  He denies any fever or chills.  He denies any nausea or vomiting.  He denies any abdominal pain.  Specifically he denies any left lower quadrant abdominal pain.  He denies any melena or hematochezia.  His watery diarrhea.  His blood pressure at home has been running just above 100.  Diastolic blood pressure has been 40-50.  This is extremely low.  He denies taking Lasix but he is on the remainder of his medications on the medicine list.  I believe that he is likely dehydrated given the fact he has lost 5 pounds since his last visit and he is also hypotensive relatively.  This could be causing the weakness.  He also reports some mild dysuria.  At that time, my plan was:  I believe the patient likely has a viral gastroenteritis.  I believe this is causing his diarrhea.  I believe he is also likely dehydrated given the weight loss and this is causing fatigue due to the hypotension.  Therefore I recommended that he push fluids.  Temporarily discontinue furosemide.  Discontinue benazepril.  He was not supposed to be taking hydrochlorothiazide as we replaced this with furosemide at his last visit.  I will make sure that he hold all 3 of these medications.  Check CBC CMP.  Use Lomotil as needed for diarrhea.  Obtain urinalysis to rule out UTI.  Urinalysis shows +3 leukocyte esterase and trace blood.  Begin Bactrim double strength tablets twice daily for 1 week.  Recheck in 1 week or sooner if worse  02/17/21 No visits with results within 1 Week(s) from this visit.  Latest known visit with results is:  Office Visit on 02/10/2021  Component Date Value Ref Range Status   Color,  Urine 02/10/2021 YELLOW  YELLOW Final   APPearance 02/10/2021 SLIGHTLY CLOUDY (A)  CLEAR Final   Specific Gravity, Urine 02/10/2021 1.015  1.001 - 1.035 Final   pH 02/10/2021 5.5  5.0 - 8.0 Final   Glucose, UA 02/10/2021 NEGATIVE  NEGATIVE Final   Bilirubin Urine 02/10/2021 NEGATIVE  NEGATIVE Final   Ketones, ur 02/10/2021 NEGATIVE  NEGATIVE Final   Hgb urine dipstick 02/10/2021 TRACE (A)  NEGATIVE Final   Protein, ur 02/10/2021 NEGATIVE  NEGATIVE Final   Nitrite 02/10/2021 NEGATIVE  NEGATIVE Final   Leukocytes,Ua 02/10/2021 3+ (A)  NEGATIVE Final   WBC, UA 02/10/2021 20-40 (A)  0 - 5 /HPF Final   RBC / HPF 02/10/2021 0-2  0 - 2 /HPF Final   Squamous Epithelial / LPF 02/10/2021 NONE SEEN  < OR = 5 /HPF Final   Bacteria, UA 02/10/2021 FEW (A)  NONE SEEN /HPF Final   Hyaline Cast 02/10/2021 NONE SEEN  NONE SEEN /LPF Final   WBC 02/10/2021 6.7  3.8 - 10.8 Thousand/uL Final   RBC 02/10/2021 3.21 (L)  4.20 - 5.80 Million/uL Final   Hemoglobin 02/10/2021 10.1 (L)  13.2 - 17.1 g/dL Final   HCT 02/10/2021 30.8 (L)  38.5 - 50.0 % Final   MCV 02/10/2021 96.0  80.0 - 100.0 fL Final   MCH 02/10/2021 31.5  27.0 -  33.0 pg Final   MCHC 02/10/2021 32.8  32.0 - 36.0 g/dL Final   RDW 02/10/2021 13.6  11.0 - 15.0 % Final   Platelets 02/10/2021 200  140 - 400 Thousand/uL Final   MPV 02/10/2021 13.7 (H)  7.5 - 12.5 fL Final   Neutro Abs 02/10/2021 4,107  1,500 - 7,800 cells/uL Final   Lymphs Abs 02/10/2021 1,487  850 - 3,900 cells/uL Final   Absolute Monocytes 02/10/2021 677  200 - 950 cells/uL Final   Eosinophils Absolute 02/10/2021 402  15 - 500 cells/uL Final   Basophils Absolute 02/10/2021 27  0 - 200 cells/uL Final   Neutrophils Relative % 02/10/2021 61.3  % Final   Total Lymphocyte 02/10/2021 22.2  % Final   Monocytes Relative 02/10/2021 10.1  % Final   Eosinophils Relative 02/10/2021 6.0  % Final   Basophils Relative 02/10/2021 0.4  % Final   Glucose, Bld 02/10/2021 110 (H)  65 - 99 mg/dL Final    Comment: .            Fasting reference interval . For someone without known diabetes, a glucose value between 100 and 125 mg/dL is consistent with prediabetes and should be confirmed with a follow-up test. .    BUN 02/10/2021 35 (H)  7 - 25 mg/dL Final   Creat 02/10/2021 1.52 (H)  0.70 - 1.28 mg/dL Final   eGFR 02/10/2021 46 (L)  > OR = 60 mL/min/1.44m Final   Comment: The eGFR is based on the CKD-EPI 2021 equation. To calculate  the new eGFR from a previous Creatinine or Cystatin C result, go to https://www.kidney.org/professionals/ kdoqi/gfr%5Fcalculator    BUN/Creatinine Ratio 02/10/2021 23 (H)  6 - 22 (calc) Final   Sodium 02/10/2021 142  135 - 146 mmol/L Final   Potassium 02/10/2021 5.1  3.5 - 5.3 mmol/L Final   Chloride 02/10/2021 109  98 - 110 mmol/L Final   CO2 02/10/2021 25  20 - 32 mmol/L Final   Calcium 02/10/2021 8.8  8.6 - 10.3 mg/dL Final   Total Protein 02/10/2021 5.6 (L)  6.1 - 8.1 g/dL Final   Albumin 02/10/2021 3.7  3.6 - 5.1 g/dL Final   Globulin 02/10/2021 1.9  1.9 - 3.7 g/dL (calc) Final   AG Ratio 02/10/2021 1.9  1.0 - 2.5 (calc) Final   Total Bilirubin 02/10/2021 0.5  0.2 - 1.2 mg/dL Final   Alkaline phosphatase (APISO) 02/10/2021 72  35 - 144 U/L Final   AST 02/10/2021 64 (H)  10 - 35 U/L Final   ALT 02/10/2021 106 (H)  9 - 46 U/L Final   Note 02/10/2021    Final   Comment: This urine was analyzed for the presence of WBC,  RBC, bacteria, casts, and other formed elements.  Only those elements seen were reported. . .    I am happy to say that the patient looks much better today.  His color and his skin turgor look much better.  His blood pressure today is back to normal.  He is still holding furosemide and benazepril.  He denies any dysuria.  The diarrhea has subsided.  He is more talkative today.  The weakness is improving.  The lab work above showed elevated liver function test, elevated creatinine all of which I suspected was a sign of a viral  gastroenteritis coupled with a urinary tract infection coupled with dehydration and hypotension.  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    History of MI (myocardial infarction)    History of nuclear stress test 08/07/2010   dipyridamole; EKG negative for ischemia, low risk scan    Hyperlipidemia    Hyperplastic colon polyp 05/2008   Hypertension    Ischemic cardiomyopathy    EF 45%, with inferior wall motion abnormality    Myocardial infarction ([place])    Valvular regurgitation    mitral and tricuspid (mild)   Past Surgical History:  Procedure Laterality Date   CORONARY ARTERY BYPASS GRAFT  2000   LIMA to LAD, free IMA to OM2, sequential graft to PLA & PLD   ESOPHAGOGASTRODUODENOSCOPY N/A 05/07/2013   Procedure: ESOPHAGOGASTRODUODENOSCOPY (EGD);  Surgeon: Gatha Mayer, MD;  Location: [facility] ENDOSCOPY;  Service: Endoscopy;  Laterality: N/A;   TRANSTHORACIC ECHOCARDIOGRAM  09/07/2010   EF 17-49%; LV systolic function mildly reduced; LA mildly dilated; mild-mod MR & mild-mod TR; aortic root sclerosis/calcification;    Current Outpatient Medications on File Prior to Visit  Medication Sig Dispense Refill   allopurinol (ZYLOPRIM) 300 MG tablet Take 1 tablet (300 mg total) by mouth daily. 90 tablet 1   amLODipine (NORVASC) 5 MG tablet Take 2 tablets (10 mg total) by mouth daily. 180 tablet 3   aspirin 81 MG tablet Take 81 mg by mouth daily.     benazepril (LOTENSIN) 40 MG tablet Take 1 tablet (40 mg total) by mouth daily. 90 tablet 3    diphenoxylate-atropine (LOMOTIL) 2.5-0.025 MG tablet Take 1 tablet by mouth 4 (four) times daily as needed for diarrhea or loose stools. 30 tablet 0   furosemide (LASIX) 40 MG tablet Take 1 tablet (40 mg total) by mouth daily. 30 tablet 3   hydrochlorothiazide (HYDRODIURIL) 25 MG tablet Take 1 tablet (25 mg total) by mouth daily. 90 tablet 3   metoprolol tartrate (LOPRESSOR) 50 MG tablet TAKE ONE AND ONE-HALF (1 & 1/2) TABLET BY MOUTH TWO TIMES A DAY 270 tablet 1   mirtazapine (REMERON) 30 MG tablet TAKE ONE TABLET BY MOUTH AT BEDTIME FOR APPETITE 30 tablet 4   simvastatin (ZOCOR) 40 MG tablet Take 1 tablet (40 mg total) by mouth daily. 90 tablet 3   sulfamethoxazole-trimethoprim (BACTRIM DS) 800-160 MG tablet Take 1 tablet by mouth 2 (two) times daily. 14 tablet 0   vitamin B-12 (CYANOCOBALAMIN) 1000 MCG tablet Take 1,000 mcg by mouth daily.     No current facility-administered medications on file prior to visit.   No Known Allergies Social History   Socioeconomic History   Marital status: Married    Spouse name: Not on file   Number of children: 1   Years of education: Not on file   Highest education level: Not on file  Occupational History   Not on file  Tobacco Use   Smoking status: Former    Types: Cigarettes    Quit date: 11/04/1985    Years since quitting: 35.3   Smokeless tobacco: Never  Substance and Sexual Activity   Alcohol use: Yes  Alcohol/week: 2.0 - 3.0 standard drinks    Types: 2 - 3 Cans of beer per week   Drug use: No   Sexual activity: Not on file  Other Topics Concern   Not on file  Social History Narrative   Not on file   Social Determinants of Health   Financial Resource Strain: Low Risk    Difficulty of Paying Living Expenses: Not hard at all  Food Insecurity: No Food Insecurity   Worried About Charity fundraiser in the Last Year: Never true   Ran Out of Food in the Last Year: Never true  Transportation Needs: No Transportation Needs   Lack of  Transportation (Medical): No   Lack of Transportation (Non-Medical): No  Physical Activity: Sufficiently Active   Days of Exercise per Week: 7 days   Minutes of Exercise per Session: 30 min  Stress: No Stress Concern Present   Feeling of Stress : Not at all  Social Connections: Moderately Integrated   Frequency of Communication with Friends and Family: More than three times a week   Frequency of Social Gatherings with Friends and Family: Three times a week   Attends Religious Services: 1 to 4 times per year   Active Member of Clubs or Organizations: No   Attends Archivist Meetings: Never   Marital Status: Married  Human resources officer Violence: Not At Risk   Fear of Current or Ex-Partner: No   Emotionally Abused: No   Physically Abused: No   Sexually Abused: No     Review of Systems  All other systems reviewed and are negative.     Objective:   Physical Exam Vitals reviewed.  Constitutional:      Appearance: He is well-developed.  Neck:     Thyroid: No thyromegaly.     Vascular: No JVD.  Cardiovascular:     Rate and Rhythm: Normal rate and regular rhythm.     Heart sounds: Normal heart sounds. No murmur heard. Pulmonary:     Effort: Pulmonary effort is normal. No respiratory distress.     Breath sounds: Normal breath sounds. No wheezing or rales.  Chest:     Chest wall: No tenderness.  Abdominal:     General: Bowel sounds are normal. There is no distension.     Palpations: Abdomen is soft. There is no mass.     Tenderness: There is no abdominal tenderness. There is no guarding or rebound.  Musculoskeletal:     Cervical back: Neck supple.  Lymphadenopathy:     Cervical: No cervical adenopathy.  Skin:    Findings: No rash.          Assessment & Plan:  Dehydration - Plan: CBC with Differential/Platelet, COMPLETE METABOLIC PANEL WITH GFR  Hypotension due to hypovolemia - Plan: CBC with Differential/Platelet, COMPLETE METABOLIC PANEL WITH GFR Clinically  the patient appears to be back at his baseline.  I will repeat a CBC and a CMP today.  If renal function has returned to normal and liver function test have returned to normal no further work-up is necessary.  I would still hold his benazepril and his furosemide until his blood pressure starts to rise.  Anticipate next week we will start to see systolic blood pressures rising above 140.  Therefore I will tentatively tell the patient to hold his blood pressure medication until next week.  Obviously if his lab work is still abnormal, we will institute a more thorough diagnostic work-up.

## 2021-02-23 ENCOUNTER — Other Ambulatory Visit: Payer: Self-pay

## 2021-02-23 ENCOUNTER — Telehealth: Payer: Self-pay

## 2021-02-23 DIAGNOSIS — E538 Deficiency of other specified B group vitamins: Secondary | ICD-10-CM

## 2021-02-23 DIAGNOSIS — R710 Precipitous drop in hematocrit: Secondary | ICD-10-CM

## 2021-02-23 DIAGNOSIS — N289 Disorder of kidney and ureter, unspecified: Secondary | ICD-10-CM

## 2021-02-24 ENCOUNTER — Other Ambulatory Visit: Payer: Medicare Other

## 2021-02-24 ENCOUNTER — Other Ambulatory Visit: Payer: Self-pay | Admitting: Family Medicine

## 2021-02-24 ENCOUNTER — Other Ambulatory Visit: Payer: Self-pay

## 2021-02-24 DIAGNOSIS — N289 Disorder of kidney and ureter, unspecified: Secondary | ICD-10-CM

## 2021-02-24 DIAGNOSIS — N179 Acute kidney failure, unspecified: Secondary | ICD-10-CM

## 2021-02-24 DIAGNOSIS — R710 Precipitous drop in hematocrit: Secondary | ICD-10-CM

## 2021-02-24 DIAGNOSIS — E538 Deficiency of other specified B group vitamins: Secondary | ICD-10-CM

## 2021-02-24 LAB — IRON: Iron: 132 ug/dL (ref 50–180)

## 2021-02-24 LAB — VITAMIN B12: Vitamin B-12: 1046 pg/mL (ref 200–1100)

## 2021-02-25 DIAGNOSIS — R710 Precipitous drop in hematocrit: Secondary | ICD-10-CM | POA: Diagnosis not present

## 2021-02-26 ENCOUNTER — Ambulatory Visit
Admission: RE | Admit: 2021-02-26 | Discharge: 2021-02-26 | Disposition: A | Discharge status: Home / Self Care | Payer: Medicare Other | Source: Ambulatory Visit | Attending: Family Medicine | Admitting: Family Medicine

## 2021-02-26 DIAGNOSIS — N289 Disorder of kidney and ureter, unspecified: Secondary | ICD-10-CM

## 2021-02-26 DIAGNOSIS — N179 Acute kidney failure, unspecified: Secondary | ICD-10-CM

## 2021-02-26 DIAGNOSIS — N281 Cyst of kidney, acquired: Secondary | ICD-10-CM | POA: Diagnosis not present

## 2021-02-27 LAB — FECAL GLOBIN BY IMMUNOCHEMISTRY
FECAL GLOBIN RESULT:: NOT DETECTED
MICRO NUMBER:: 13018324
SPECIMEN QUALITY:: ADEQUATE

## 2021-03-04 ENCOUNTER — Other Ambulatory Visit: Payer: Self-pay

## 2021-03-04 ENCOUNTER — Ambulatory Visit (INDEPENDENT_AMBULATORY_CARE_PROVIDER_SITE_OTHER): Payer: Medicare Other | Admitting: Internal Medicine

## 2021-03-04 ENCOUNTER — Encounter: Payer: Self-pay | Admitting: Internal Medicine

## 2021-03-04 VITALS — BP 125/56 | HR 70 | Ht 68.05 in | Wt 152.2 lb

## 2021-03-04 DIAGNOSIS — E782 Mixed hyperlipidemia: Secondary | ICD-10-CM | POA: Diagnosis not present

## 2021-03-04 DIAGNOSIS — I10 Essential (primary) hypertension: Secondary | ICD-10-CM | POA: Diagnosis not present

## 2021-03-04 DIAGNOSIS — I25812 Atherosclerosis of bypass graft of coronary artery of transplanted heart without angina pectoris: Secondary | ICD-10-CM

## 2021-03-04 DIAGNOSIS — Z951 Presence of aortocoronary bypass graft: Secondary | ICD-10-CM | POA: Diagnosis not present
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
## 2021-03-04 NOTE — Patient Instructions (Signed)
Medication Instructions:  Your physician recommends that you continue on your current medications as directed. Please refer to the Current Medication list given to you today.  *If you need a refill on your cardiac medications before your next appointment, please call your pharmacy*  Follow-Up: At [facility], you and your health needs are our priority.  As part of our continuing mission to provide you with exceptional heart care, we have created designated Provider Care Teams.  These Care Teams include your primary Cardiologist (physician) and Advanced Practice Providers (APPs -  Physician Assistants and Nurse Practitioners) who all work together to provide you with the care you need, when you need it.  We recommend signing up for the patient portal called "MyChart".  Sign up information is provided on this After Visit Summary.  MyChart is used to connect with patients for Virtual Visits (Telemedicine).  Patients are able to view lab/test results, encounter notes, upcoming appointments, etc.  Non-urgent messages can be sent to your provider as well.   To learn more about what you can do with MyChart, go to [email or web address].    Your next appointment:   12 month(s)  The format for your next appointment:   In Person  Provider:   Dr. Debara Pickett

## 2021-03-05 ENCOUNTER — Other Ambulatory Visit: Payer: Self-pay

## 2021-03-05 MED ORDER — FUROSEMIDE 40 MG PO TABS: 40.0000 mg | ORAL_TABLET | Freq: Every day | ORAL | 3 refills | Status: DC

## 2021-03-10 DIAGNOSIS — E119 Type 2 diabetes mellitus without complications: Secondary | ICD-10-CM | POA: Diagnosis not present

## 2021-03-10 DIAGNOSIS — H5212 Myopia, left eye: Secondary | ICD-10-CM | POA: Diagnosis not present

## 2021-03-10 DIAGNOSIS — H5201 Hypermetropia, right eye: Secondary | ICD-10-CM | POA: Diagnosis not present

## 2021-03-10 DIAGNOSIS — H2513 Age-related nuclear cataract, bilateral: Secondary | ICD-10-CM | POA: Diagnosis not present

## 2021-03-17 ENCOUNTER — Telehealth: Payer: Self-pay

## 2021-03-17 MED ORDER — MIRTAZAPINE 30 MG PO TABS
ORAL_TABLET | ORAL | 4 refills | Status: DC
Start: 2021-03-17 — End: 2021-08-17

## 2021-03-17 NOTE — Telephone Encounter (Signed)
Pharmacy faxed refill request for  ? ?mirtazapine (REMERON) 30 MG tablet [[id number]]  ? ?Dose, Route, Frequency: As Directed  ?Dispense Quantity: 30 tablet Refills: 4   ?     ?Sig: TAKE ONE TABLET BY MOUTH AT BEDTIME FOR APPETITE  ?     ?Start Date: 05/19/20 End Date: --  ?Written Date: 05/19/20 Expiration Date: 05/19/21  ?Original Order:  mirtazapine (REMERON) 30 MG tablet [[id number]]  ? ?

## 2021-03-17 NOTE — Telephone Encounter (Signed)
Rx sent to pharmacy   

## 2021-04-08 DIAGNOSIS — L298 Other pruritus: Secondary | ICD-10-CM | POA: Diagnosis not present

## 2021-04-08 DIAGNOSIS — Z08 Encounter for follow-up examination after completed treatment for malignant neoplasm: Secondary | ICD-10-CM | POA: Diagnosis not present

## 2021-04-08 DIAGNOSIS — Z85820 Personal history of malignant melanoma of skin: Secondary | ICD-10-CM | POA: Diagnosis not present

## 2021-04-08 DIAGNOSIS — Z872 Personal history of diseases of the skin and subcutaneous tissue: Secondary | ICD-10-CM | POA: Diagnosis not present

## 2021-04-08 DIAGNOSIS — L538 Other specified erythematous conditions: Secondary | ICD-10-CM | POA: Diagnosis not present

## 2021-04-08 DIAGNOSIS — R208 Other disturbances of skin sensation: Secondary | ICD-10-CM | POA: Diagnosis not present

## 2021-04-08 DIAGNOSIS — L814 Other melanin hyperpigmentation: Secondary | ICD-10-CM | POA: Diagnosis not present

## 2021-04-08 DIAGNOSIS — L820 Inflamed seborrheic keratosis: Secondary | ICD-10-CM | POA: Diagnosis not present

## 2021-04-08 DIAGNOSIS — C4359 Malignant melanoma of other part of trunk: Secondary | ICD-10-CM | POA: Diagnosis not present

## 2021-04-08 DIAGNOSIS — L821 Other seborrheic keratosis: Secondary | ICD-10-CM | POA: Diagnosis not present

## 2021-04-08 DIAGNOSIS — D225 Melanocytic nevi of trunk: Secondary | ICD-10-CM | POA: Diagnosis not present

## 2021-04-08 DIAGNOSIS — Z09 Encounter for follow-up examination after completed treatment for conditions other than malignant neoplasm: Secondary | ICD-10-CM | POA: Diagnosis not present

## 2021-05-02 ENCOUNTER — Other Ambulatory Visit: Payer: Self-pay | Admitting: Family Medicine

## 2021-05-02 DIAGNOSIS — E119 Type 2 diabetes mellitus without complications: Secondary | ICD-10-CM

## 2021-05-10 ENCOUNTER — Other Ambulatory Visit: Payer: Self-pay | Admitting: Family Medicine

## 2021-05-10 DIAGNOSIS — E782 Mixed hyperlipidemia: Secondary | ICD-10-CM

## 2021-05-10 DIAGNOSIS — E119 Type 2 diabetes mellitus without complications: Secondary | ICD-10-CM

## 2021-05-14 IMAGING — DX DG CHEST 2V
2 series · 2 of 2 positions shown · non-contrast
Comparison: None.

CLINICAL DATA: Weight loss. Former smoker. COPD and coronary artery
disease.

EXAM:
CHEST - 2 VIEW

[dg chest 2 view (1 of 2)]
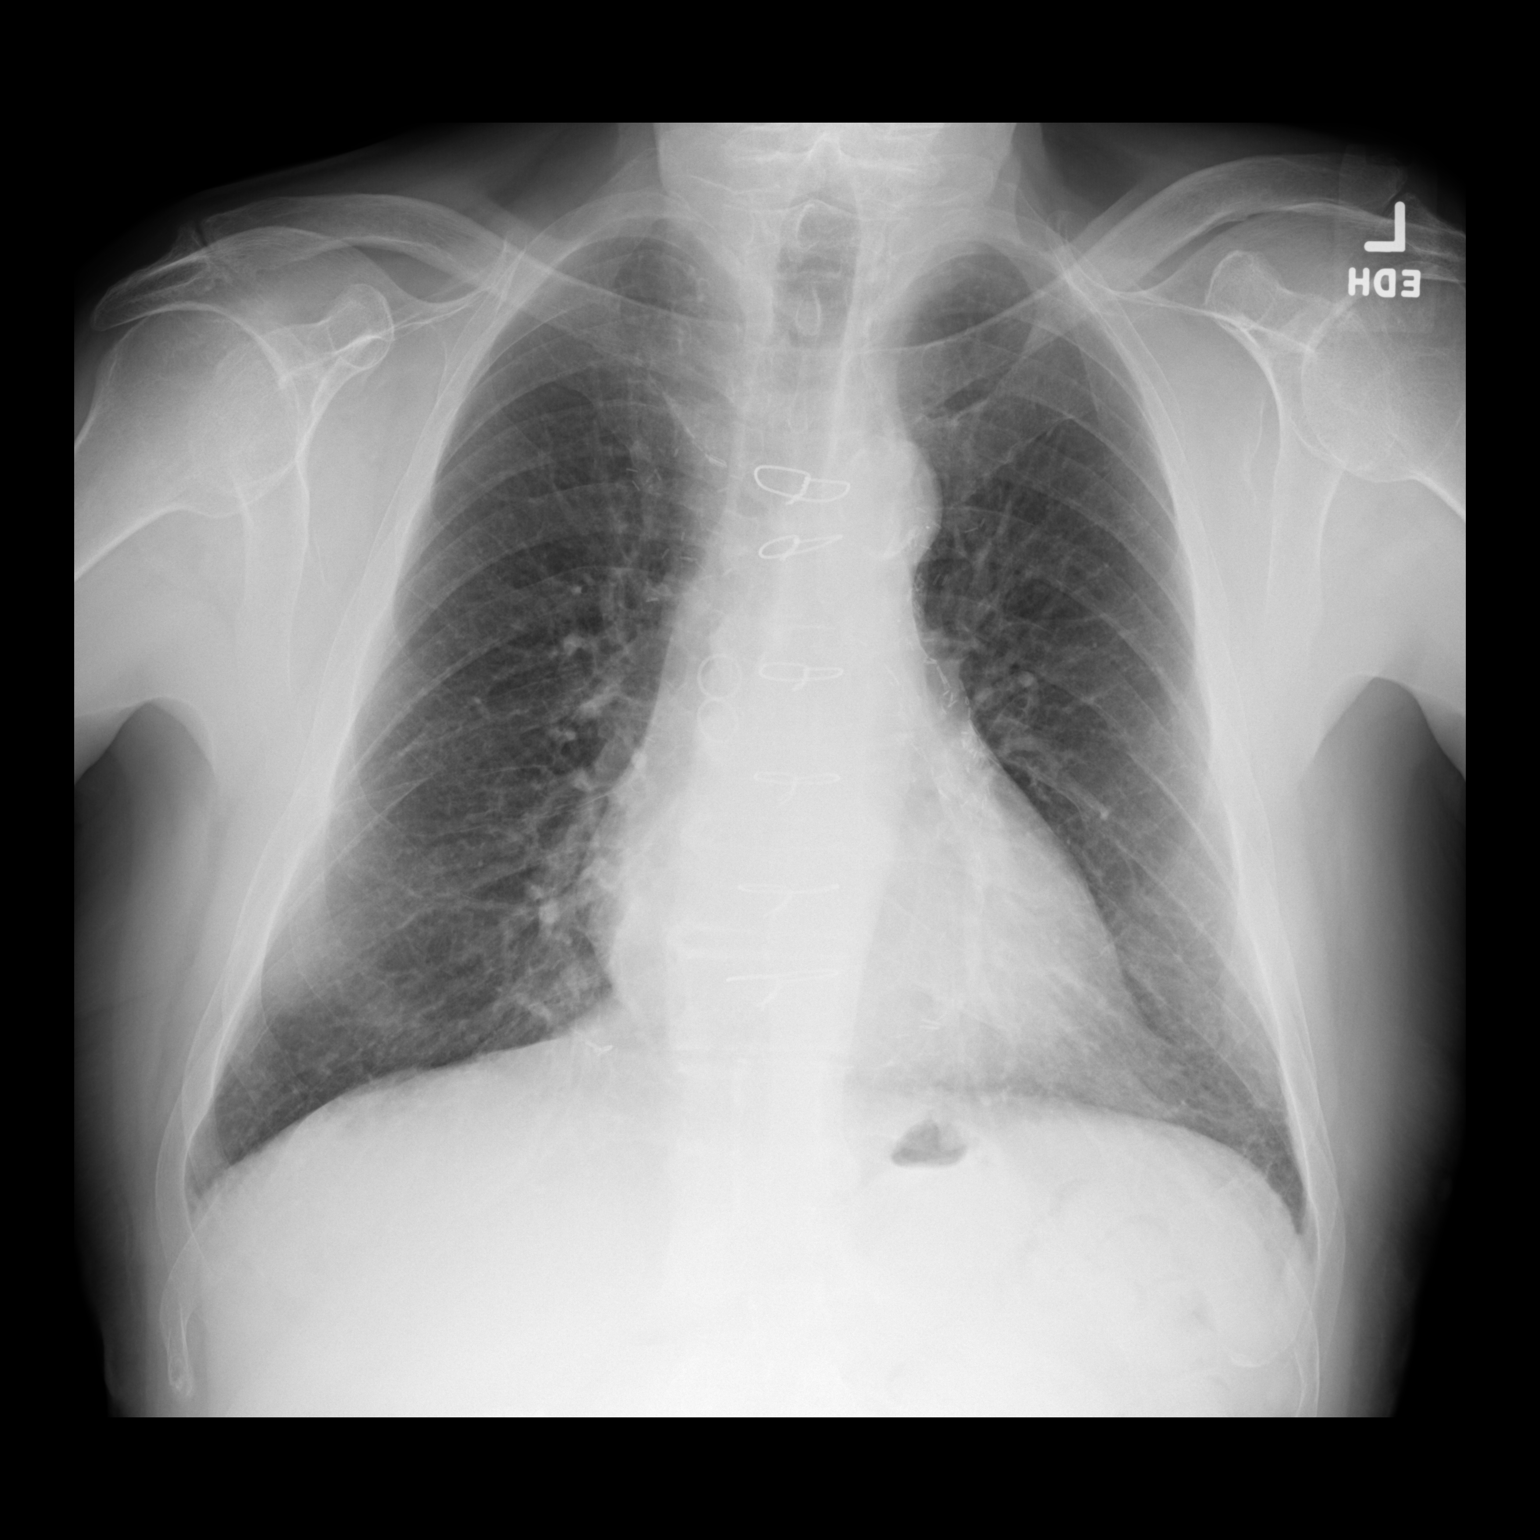

[dg chest 2 view (2 of 2)]
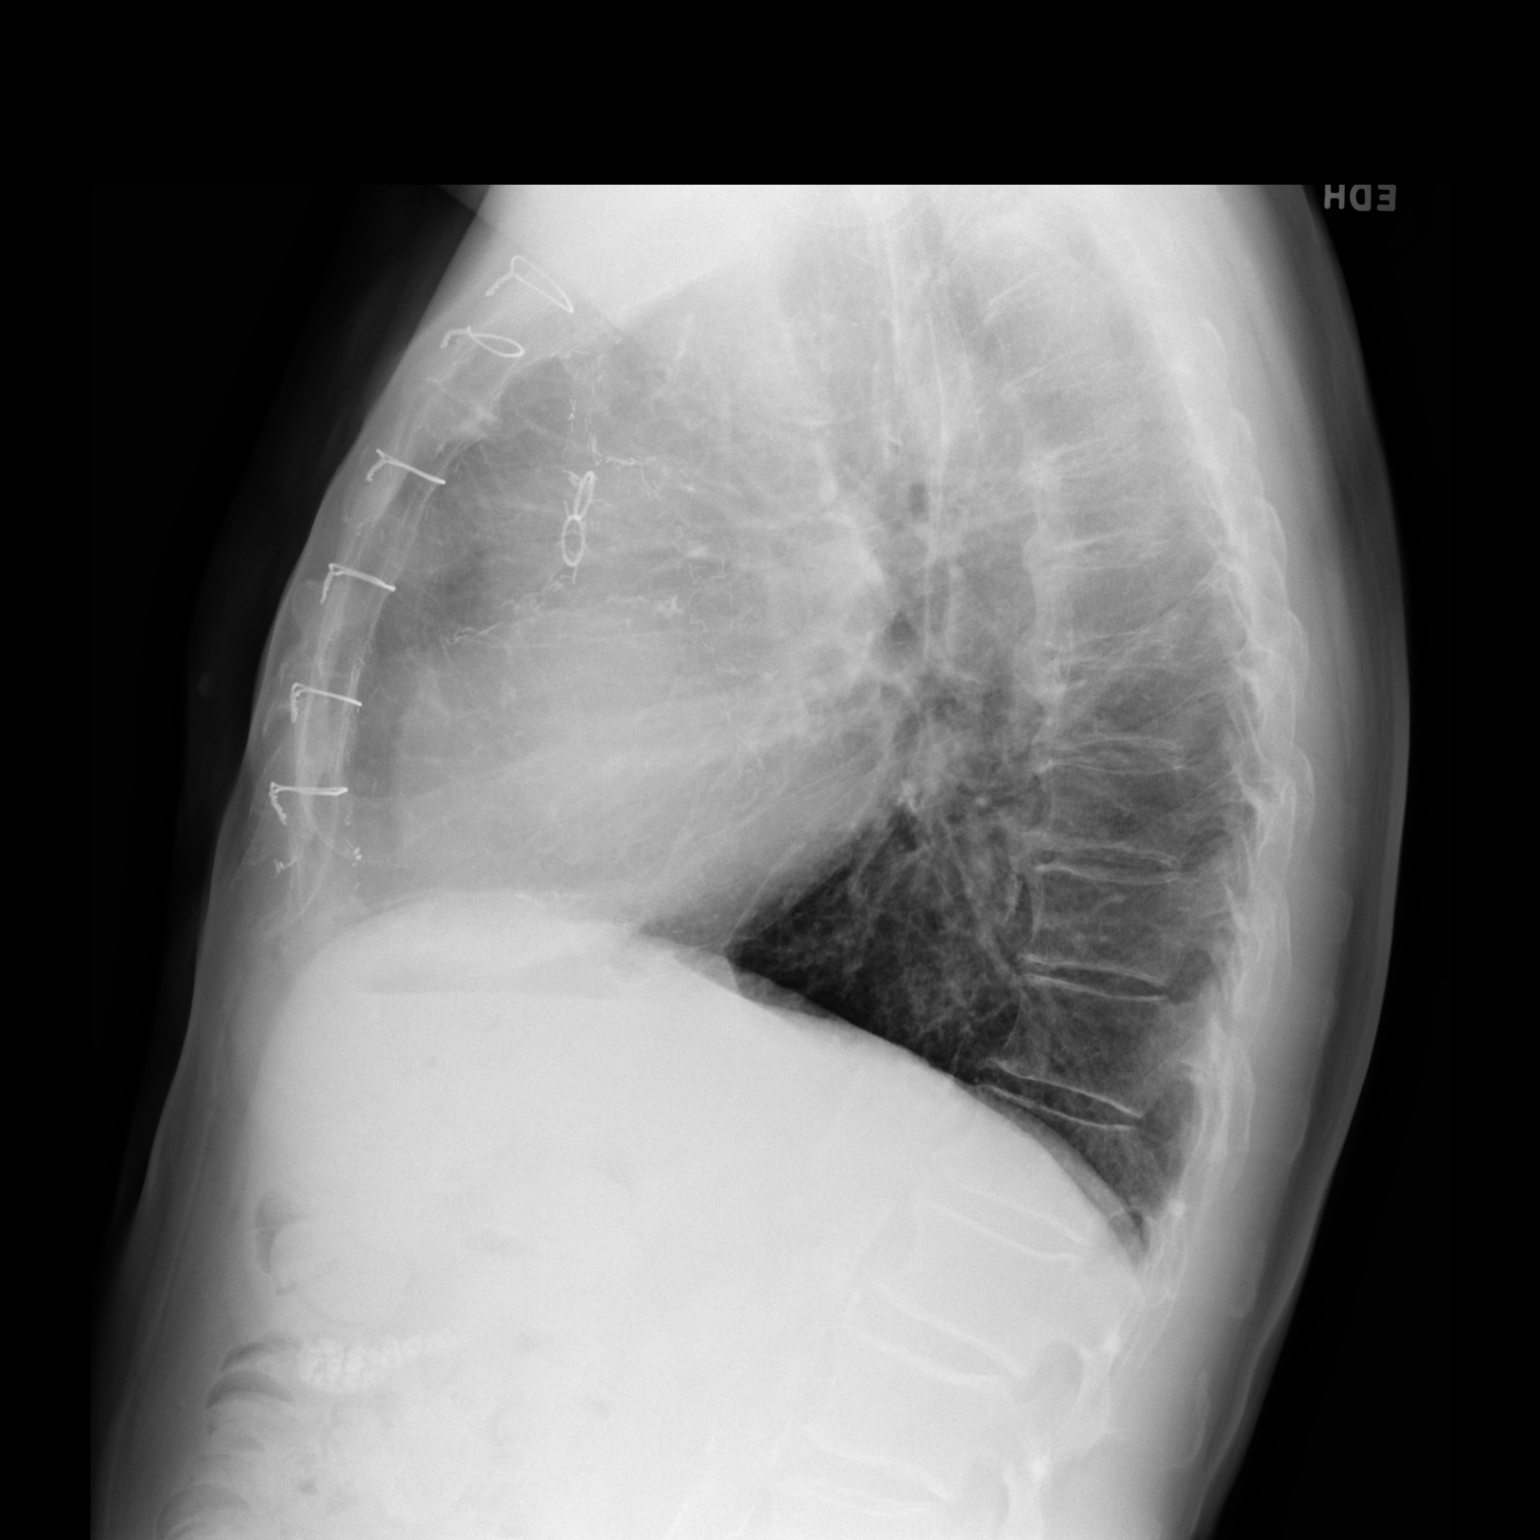

[2 of 2 positions shown; findings below may reference images not displayed]

FINDINGS: Mild cardiomegaly noted. Prior CABG.  Aortic atherosclerosis.

Mild pulmonary hyperinflation noted. No evidence of pulmonary
infiltrate or edema. No evidence of pneumothorax or pleural
effusion.
IMPRESSION: Mild cardiomegaly and probable COPD.  No active lung disease.

## 2021-05-15 ENCOUNTER — Other Ambulatory Visit: Payer: Medicare Other

## 2021-05-15 ENCOUNTER — Other Ambulatory Visit: Payer: Self-pay | Admitting: Family Medicine

## 2021-05-15 DIAGNOSIS — D519 Vitamin B12 deficiency anemia, unspecified: Secondary | ICD-10-CM | POA: Diagnosis not present

## 2021-05-15 DIAGNOSIS — E782 Mixed hyperlipidemia: Secondary | ICD-10-CM | POA: Diagnosis not present

## 2021-05-15 DIAGNOSIS — E119 Type 2 diabetes mellitus without complications: Secondary | ICD-10-CM

## 2021-05-15 NOTE — Telephone Encounter (Signed)
Requested Prescriptions  ?Pending Prescriptions Disp Refills  ?? metoprolol tartrate (LOPRESSOR) 50 MG tablet [Pharmacy Med Name: METOPROLOL TARTRATE 50 MG TAB] 270 tablet 0  ?  Sig: TAKE ONE AND ONE HALF TABLET BY MOUTH TWICE DAILY  ?  ? Cardiovascular:  Beta Blockers Passed - 05/15/2021  6:11 AM  ?  ?  Passed - Last BP in normal range  ?  BP Readings from Last 1 Encounters:  ?03/04/21 (!) 125/56  ?   ?  ?  Passed - Last Heart Rate in normal range  ?  Pulse Readings from Last 1 Encounters:  ?03/04/21 70  ?   ?  ?  Passed - Valid encounter within last 6 months  ?  Recent Outpatient Visits   ?      ? 2 months ago Dehydration  ? [facility] Pickard, Cammie Mcgee, MD  ? 3 months ago Dysuria  ? [facility] Pickard, Cammie Mcgee, MD  ? 8 months ago Type 2 diabetes mellitus without complication, without long-term current use of insulin ([place])  ? [facility] Pickard, Cammie Mcgee, MD  ? 11 months ago Benign essential HTN  ? [facility] Pickard, Cammie Mcgee, MD  ? 1 year ago Type 2 diabetes mellitus without complication, without long-term current use of insulin ([place])  ? [facility] Pickard, Cammie Mcgee, MD  ?  ?  ?Future Appointments   ?        ? In 3 days Pickard, Cammie Mcgee, MD [place]  ? In 6 months Pickard, Cammie Mcgee, MD [place], [facility]  ?  ? ?  ?  ?  ? ?

## 2021-05-16 LAB — IRON,TIBC AND FERRITIN PANEL
%SAT: 61 % — ABNORMAL HIGH (ref 20–48)
Ferritin: 507 ng/mL — ABNORMAL HIGH (ref 24–380)
Iron: 128 ug/dL (ref 50–180)
TIBC: 211 ug/dL — ABNORMAL LOW (ref 250–425)

## 2021-05-16 LAB — HEMOGLOBIN A1C
Hgb A1c MFr Bld: 5.5 % of total Hgb (ref <5.7)
Mean Plasma Glucose: 111 mg/dL
eAG (mmol/L): 6.2 mmol/L

## 2021-05-16 LAB — LIPID PANEL
Cholesterol: 130 mg/dL (ref <200)
HDL: 61 mg/dL (ref >=40)
LDL Cholesterol (Calc): 56 mg/dL (calc)
Non-HDL Cholesterol (Calc): 69 mg/dL (calc) (ref <130)
Total CHOL/HDL Ratio: 2.1 (calc) (ref <5.0)
Triglycerides: 44 mg/dL (ref <150)

## 2021-05-18 ENCOUNTER — Other Ambulatory Visit: Payer: Self-pay | Admitting: Family Medicine

## 2021-05-18 ENCOUNTER — Ambulatory Visit (INDEPENDENT_AMBULATORY_CARE_PROVIDER_SITE_OTHER): Payer: Medicare Other | Admitting: Family Medicine

## 2021-05-18 VITALS — BP 130/58 | HR 62 | Temp 97.9°F | Ht 68.0 in | Wt 154.6 lb

## 2021-05-18 DIAGNOSIS — R0989 Other specified symptoms and signs involving the circulatory and respiratory systems: Secondary | ICD-10-CM

## 2021-05-18 DIAGNOSIS — R710 Precipitous drop in hematocrit: Secondary | ICD-10-CM

## 2021-05-18 DIAGNOSIS — I25812 Atherosclerosis of bypass graft of coronary artery of transplanted heart without angina pectoris: Secondary | ICD-10-CM | POA: Diagnosis not present

## 2021-05-18 DIAGNOSIS — I10 Essential (primary) hypertension: Secondary | ICD-10-CM

## 2021-05-18 LAB — HEMOGLOBIN, FINGERSTICK: POC HEMOGLOBIN: 11.1 g/dL — ABNORMAL LOW (ref 13.0–17.0)

## 2021-05-18 NOTE — Progress Notes (Signed)
? ?Subjective:  ? ? Patient ID: Donald Rios, male    DOB: 12/05/1940, 81 y.o.   MRN: [id number] ?Saw the patient in February and at that time his hemoglobin had dropped to 9.9.  Has been taking an iron supplement since that time and today his fingerstick hemoglobin is 11.1.  His blood pressure is outstanding.  His most recent fasting lab work is below: ?Lab on 05/15/2021  ?Component Date Value Ref Range Status  ? Iron 05/15/2021 128  50 - 180 mcg/dL Final  ? TIBC 05/15/2021 211 (L)  250 - 425 mcg/dL (calc) Final  ? %SAT 05/15/2021 61 (H)  20 - 48 % (calc) Final  ? Ferritin 05/15/2021 507 (H)  24 - 380 ng/mL Final  ? Hgb A1c MFr Bld 05/15/2021 5.5  <5.7 % of total Hgb Final  ? Comment: For the purpose of screening for the presence of ?diabetes: ?. ?<5.7%       Consistent with the absence of diabetes ?5.7-6.4%    Consistent with increased risk for diabetes ?            (prediabetes) ?> or =6.5%  Consistent with diabetes ?Marland Kitchen ?This assay result is consistent with a decreased risk ?of diabetes. ?. ?Currently, no consensus exists regarding use of ?hemoglobin A1c for diagnosis of diabetes in children. ?. ?According to American Diabetes Association (ADA) ?guidelines, hemoglobin A1c <7.0% represents optimal ?control in non-pregnant diabetic patients. Different ?metrics may apply to specific patient populations.  ?Standards of Medical Care in Diabetes(ADA). ?. ?  ? Mean Plasma Glucose 05/15/2021 111  mg/dL Final  ? eAG (mmol/L) 05/15/2021 6.2  mmol/L Final  ? Cholesterol 05/15/2021 130  <200 mg/dL Final  ? HDL 05/15/2021 61  > OR = 40 mg/dL Final  ? Triglycerides 05/15/2021 44  <150 mg/dL Final  ? LDL Cholesterol (Calc) 05/15/2021 56  mg/dL (calc) Final  ? Comment: Reference range: <100 ?Marland Kitchen ?Desirable range <100 mg/dL for primary prevention;   ?<70 mg/dL for patients with CHD or diabetic patients  ?with > or = 2 CHD risk factors. ?. ?LDL-C is now calculated using the Martin-Hopkins  ?calculation, which is a validated novel  method providing  ?better accuracy than the Friedewald equation in the  ?estimation of LDL-C.  ?Cresenciano Genre et al. Annamaria Helling. 4627;035(00): 2061-2068  ?(http://education.[employer].com/faq/FAQ164) ?  ? Total CHOL/HDL Ratio 05/15/2021 2.1  <5.0 (calc) Final  ? Non-HDL Cholesterol (Calc) 05/15/2021 69  <130 mg/dL (calc) Final  ? Comment: For patients with diabetes plus 1 major ASCVD risk  ?factor, treating to a non-HDL-C goal of <100 mg/dL  ?(LDL-C of <70 mg/dL) is considered a therapeutic  ?option. ?  ? ?Overall he is doing well.  He denies any chest pain shortness of breath or dyspnea on exertion.  However today on exam he has a right carotid bruit.  He had carotid Dopplers in 2017 that showed mild bilaterally but nothing significant.  Has not had a follow-up with that in quite some time ? ?Past Medical History:  ?Diagnosis Date  ? Acute gastric ulcer with hemorrhage 05/06/2013  ? Arthritis   ? Asthma   ? Atrial enlargement, left   ? 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   ? Cataract   ? COPD (chronic obstructive pulmonary disease) ([place])   ? Diabetes mellitus   ? Diabetic nephropathy ([place])   ? Diverticulosis   ? ED (erectile dysfunction)   ? GERD (gastroesophageal reflux disease)   ? Gout   ? Hearing  loss   ? Hiatal hernia 05/2008  ? EGD with HH and reflux esophagitis.   ? History of MI (myocardial infarction)   ? History of nuclear stress test 08/07/2010  ? dipyridamole; EKG negative for ischemia, low risk scan   ? Hyperlipidemia   ? Hyperplastic colon polyp 05/2008  ? Hypertension   ? Ischemic cardiomyopathy   ? EF 45%, with inferior wall motion abnormality   ? Myocardial infarction [facility])   ? Valvular regurgitation   ? mitral and tricuspid (mild)  ? ?Past Surgical History:  ?Procedure Laterality Date  ? CORONARY ARTERY BYPASS GRAFT  2000  ? LIMA to LAD, free IMA to OM2, sequential graft to PLA & PLD  ? ESOPHAGOGASTRODUODENOSCOPY N/A 05/07/2013  ? Procedure: ESOPHAGOGASTRODUODENOSCOPY (EGD);  Surgeon: Gatha Mayer, MD;  Location: [facility] ENDOSCOPY;  Service: Endoscopy;  Laterality: N/A;  ? TRANSTHORACIC ECHOCARDIOGRAM  09/07/2010  ? EF 37-85%; LV systolic function mildly reduced; LA mildly dilated; mild-mod MR & mild-mod TR; aortic root sclerosis/calcification;   ? ?Current Outpatient Medications on File Prior to Visit  ?Medication Sig Dispense Refill  ? allopurinol (ZYLOPRIM) 300 MG tablet TAKE ONE TABLET BY MOUTH DAILY 90 tablet 1  ? amLODipine (NORVASC) 5 MG tablet Take 2 tablets (10 mg total) by mouth daily. 180 tablet 3  ? aspirin 81 MG tablet Take 81 mg by mouth daily.    ? benazepril (LOTENSIN) 40 MG tablet Take 1 tablet (40 mg total) by mouth daily. 90 tablet 3  ? furosemide (LASIX) 40 MG tablet Take 1 tablet (40 mg total) by mouth daily. 90 tablet 3  ? metoprolol tartrate (LOPRESSOR) 50 MG tablet TAKE ONE AND ONE HALF TABLET BY MOUTH TWICE DAILY 270 tablet 0  ? mirtazapine (REMERON) 30 MG tablet TAKE ONE TABLET BY MOUTH AT BEDTIME FOR APPETITE 30 tablet 4  ? simvastatin (ZOCOR) 40 MG tablet TAKE ONE TABLET BY MOUTH DAILY 90 tablet 3  ? sulfamethoxazole-trimethoprim (BACTRIM DS) 800-160 MG tablet Take 1 tablet by mouth 2 (two) times daily. 14 tablet 0  ? vitamin B-12 (CYANOCOBALAMIN) 1000 MCG tablet Take 1,000 mcg by mouth daily.    ? diphenoxylate-atropine (LOMOTIL) 2.5-0.025 MG tablet Take 1 tablet by mouth 4 (four) times daily as needed for diarrhea or loose stools. (Patient not taking: Reported on 05/18/2021) 30 tablet 0  ? hydrochlorothiazide (HYDRODIURIL) 25 MG tablet Take 1 tablet (25 mg total) by mouth daily. (Patient not taking: Reported on 05/18/2021) 90 tablet 3  ? ?No current facility-administered medications on file prior to visit.  ? ?No Known Allergies ?Social History  ? ?Socioeconomic History  ? Marital status: Married  ?  Spouse name: Not on file  ? Number of children: 1  ? Years of education: Not on file  ? Highest education level: Not on file  ?Occupational History  ? Not on file  ?Tobacco Use  ?  Smoking status: Former  ?  Types: Cigarettes  ?  Quit date: 11/04/1985  ?  Years since quitting: 35.5  ? Smokeless tobacco: Never  ?Substance and Sexual Activity  ? Alcohol use: Yes  ?  Alcohol/week: 2.0 - 3.0 standard drinks  ?  Types: 2 - 3 Cans of beer per week  ? Drug use: No  ? Sexual activity: Not on file  ?Other Topics Concern  ? Not on file  ?Social History Narrative  ? Not on file  ? ?Social Determinants of Health  ? ?Financial Resource Strain: Low Risk   ?  Difficulty of Paying Living Expenses: Not hard at all  ?Food Insecurity: No Food Insecurity  ? Worried About Charity fundraiser in the Last Year: Never true  ? Ran Out of Food in the Last Year: Never true  ?Transportation Needs: No Transportation Needs  ? Lack of Transportation (Medical): No  ? Lack of Transportation (Non-Medical): No  ?Physical Activity: Sufficiently Active  ? Days of Exercise per Week: 7 days  ? Minutes of Exercise per Session: 30 min  ?Stress: No Stress Concern Present  ? Feeling of Stress : Not at all  ?Social Connections: Moderately Integrated  ? Frequency of Communication with Friends and Family: More than three times a week  ? Frequency of Social Gatherings with Friends and Family: Three times a week  ? Attends Religious Services: 1 to 4 times per year  ? Active Member of Clubs or Organizations: No  ? Attends Archivist Meetings: Never  ? Marital Status: Married  ?Intimate Partner Violence: Not At Risk  ? Fear of Current or Ex-Partner: No  ? Emotionally Abused: No  ? Physically Abused: No  ? Sexually Abused: No  ? ? ? ?Review of Systems  ?All other systems reviewed and are negative. ? ?   ?Objective:  ? Physical Exam ?Vitals reviewed.  ?Constitutional:   ?   Appearance: He is well-developed.  ?Neck:  ?   Thyroid: No thyromegaly.  ?   Vascular: No JVD.  ?Cardiovascular:  ?   Rate and Rhythm: Normal rate and regular rhythm.  ?   Heart sounds: Normal heart sounds. No murmur heard. ?Pulmonary:  ?   Effort: Pulmonary effort  is normal. No respiratory distress.  ?   Breath sounds: Normal breath sounds. No wheezing or rales.  ?Chest:  ?   Chest wall: No tenderness.  ?Abdominal:  ?   General: Bowel sounds are normal. There is no diste

## 2021-05-19 NOTE — Telephone Encounter (Signed)
Requested Prescriptions  ?Pending Prescriptions Disp Refills  ?? amLODipine (NORVASC) 5 MG tablet [Pharmacy Med Name: amLODIPine BESYLATE 5 MG TAB] 180 tablet 3  ?  Sig: TAKE TWO TABLETS BY MOUTH DAILY  ?  ? Cardiovascular: Calcium Channel Blockers 2 Passed - 05/18/2021  6:10 AM  ?  ?  Passed - Last BP in normal range  ?  BP Readings from Last 1 Encounters:  ?05/18/21 (!) 130/58  ?   ?  ?  Passed - Last Heart Rate in normal range  ?  Pulse Readings from Last 1 Encounters:  ?05/18/21 62  ?   ?  ?  Passed - Valid encounter within last 6 months  ?  Recent Outpatient Visits   ?      ? Yesterday Right carotid bruit  ? [facility] Pickard, Cammie Mcgee, MD  ? 3 months ago Dehydration  ? [facility] Pickard, Cammie Mcgee, MD  ? 3 months ago Dysuria  ? [facility] Pickard, Cammie Mcgee, MD  ? 8 months ago Type 2 diabetes mellitus without complication, without long-term current use of insulin ([place])  ? [facility] Pickard, Cammie Mcgee, MD  ? 12 months ago Benign essential HTN  ? [facility] Pickard, Cammie Mcgee, MD  ?  ?  ?Future Appointments   ?        ? In 6 months Pickard, Cammie Mcgee, MD [place], [facility]  ?  ? ?  ?  ?  ? ? ?

## 2021-05-26 ENCOUNTER — Ambulatory Visit
Admission: RE | Admit: 2021-05-26 | Discharge: 2021-05-26 | Disposition: A | Discharge status: Home / Self Care | Payer: Medicare Other | Source: Ambulatory Visit | Attending: Family Medicine | Admitting: Family Medicine

## 2021-05-26 DIAGNOSIS — R0989 Other specified symptoms and signs involving the circulatory and respiratory systems: Secondary | ICD-10-CM

## 2021-05-26 DIAGNOSIS — I6523 Occlusion and stenosis of bilateral carotid arteries: Secondary | ICD-10-CM | POA: Diagnosis not present

## 2021-07-09 NOTE — Patient Instructions (Addendum)
[name] , Thank you for taking time to come for your Medicare Wellness Visit. I appreciate your ongoing commitment to your health goals. Please review the following plan we discussed and let me know if I can assist you in the future.   Screening recommendations/referrals: Colonoscopy: Done 06/20/2018 No further follow up required.  Recommended yearly ophthalmology/optometry visit for glaucoma screening and checkup Recommended yearly dental visit for hygiene and checkup  Vaccinations: Influenza vaccine: Done 12/16/2020 Repeat annually  Pneumococcal vaccine: Done 01/26/2013, 03/31/2015 and 10/27/1998. Tdap vaccine: Done 05/24/2011 Repeat in 10 years  Shingles vaccine: Done 01/03/2012. Available at your local pharmacy.  Covid-19: Done 11/06/2020, 11/11/2019, 03/17/2019, 02/20/2019.  Advanced directives: Please bring a copy of your health care power of attorney and living will to the office to be added to your chart at your convenience.   Conditions/risks identified: Aim for 30 minutes of exercise or brisk walking, 6-8 glasses of water, and 5 servings of fruits and vegetables each day.   Next appointment: Follow up in one year for your annual wellness visit. 2024.  Preventive Care 50 Years and Older, Male  Preventive care refers to lifestyle choices and visits with your health care provider that can promote health and wellness. What does preventive care include? A yearly physical exam. This is also called an annual well check. Dental exams once or twice a year. Routine eye exams. Ask your health care provider how often you should have your eyes checked. Personal lifestyle choices, including: Daily care of your teeth and gums. Regular physical activity. Eating a healthy diet. Avoiding tobacco and drug use. Limiting alcohol use. Practicing safe sex. Taking low doses of aspirin every day. Taking vitamin and mineral supplements as recommended by your health care provider. What happens  during an annual well check? The services and screenings done by your health care provider during your annual well check will depend on your age, overall health, lifestyle risk factors, and family history of disease. Counseling  Your health care provider may ask you questions about your: Alcohol use. Tobacco use. Drug use. Emotional well-being. Home and relationship well-being. Sexual activity. Eating habits. History of falls. Memory and ability to understand (cognition). Work and work Statistician. Screening  You may have the following tests or measurements: Height, weight, and BMI. Blood pressure. Lipid and cholesterol levels. These may be checked every 5 years, or more frequently if you are over 28 years old. Skin check. Lung cancer screening. You may have this screening every year starting at age 52 if you have a 30-pack-year history of smoking and currently smoke or have quit within the past 15 years. Fecal occult blood test (FOBT) of the stool. You may have this test every year starting at age 44. Flexible sigmoidoscopy or colonoscopy. You may have a sigmoidoscopy every 5 years or a colonoscopy every 10 years starting at age 53. Prostate cancer screening. Recommendations will vary depending on your family history and other risks. Hepatitis C blood test. Hepatitis B blood test. Sexually transmitted disease (STD) testing. Diabetes screening. This is done by checking your blood sugar (glucose) after you have not eaten for a while (fasting). You may have this done every 1-3 years. Abdominal aortic aneurysm (AAA) screening. You may need this if you are a current or former smoker. Osteoporosis. You may be screened starting at age 72 if you are at high risk. Talk with your health care provider about your test results, treatment options, and if necessary, the need for more tests. Vaccines  Your health care provider may recommend certain vaccines, such as: Influenza vaccine. This is  recommended every year. Tetanus, diphtheria, and acellular pertussis (Tdap, Td) vaccine. You may need a Td booster every 10 years. Zoster vaccine. You may need this after age 22. Pneumococcal 13-valent conjugate (PCV13) vaccine. One dose is recommended after age 93. Pneumococcal polysaccharide (PPSV23) vaccine. One dose is recommended after age 41. Talk to your health care provider about which screenings and vaccines you need and how often you need them. This information is not intended to replace advice given to you by your health care provider. Make sure you discuss any questions you have with your health care provider. Document Released: 01/24/2015 Document Revised: 09/17/2015 Document Reviewed: 10/29/2014 Elsevier Interactive Patient Education  2017 [place] Prevention in the Home Falls can cause injuries. They can happen to people of all ages. There are many things you can do to make your home safe and to help prevent falls. What can I do on the outside of my home? Regularly fix the edges of walkways and driveways and fix any cracks. Remove anything that might make you trip as you walk through a door, such as a raised step or threshold. Trim any bushes or trees on the path to your home. Use bright outdoor lighting. Clear any walking paths of anything that might make someone trip, such as rocks or tools. Regularly check to see if handrails are loose or broken. Make sure that both sides of any steps have handrails. Any raised decks and porches should have guardrails on the edges. Have any leaves, snow, or ice cleared regularly. Use sand or salt on walking paths during winter. Clean up any spills in your garage right away. This includes oil or grease spills. What can I do in the bathroom? Use night lights. Install grab bars by the toilet and in the tub and shower. Do not use towel bars as grab bars. Use non-skid mats or decals in the tub or shower. If you need to sit down in  the shower, use a plastic, non-slip stool. Keep the floor dry. Clean up any water that spills on the floor as soon as it happens. Remove soap buildup in the tub or shower regularly. Attach bath mats securely with double-sided non-slip rug tape. Do not have throw rugs and other things on the floor that can make you trip. What can I do in the bedroom? Use night lights. Make sure that you have a light by your bed that is easy to reach. Do not use any sheets or blankets that are too big for your bed. They should not hang down onto the floor. Have a firm chair that has side arms. You can use this for support while you get dressed. Do not have throw rugs and other things on the floor that can make you trip. What can I do in the kitchen? Clean up any spills right away. Avoid walking on wet floors. Keep items that you use a lot in easy-to-reach places. If you need to reach something above you, use a strong step stool that has a grab bar. Keep electrical cords out of the way. Do not use floor polish or wax that makes floors slippery. If you must use wax, use non-skid floor wax. Do not have throw rugs and other things on the floor that can make you trip. What can I do with my stairs? Do not leave any items on the stairs. Make sure that there are  handrails on both sides of the stairs and use them. Fix handrails that are broken or loose. Make sure that handrails are as long as the stairways. Check any carpeting to make sure that it is firmly attached to the stairs. Fix any carpet that is loose or worn. Avoid having throw rugs at the top or bottom of the stairs. If you do have throw rugs, attach them to the floor with carpet tape. Make sure that you have a light switch at the top of the stairs and the bottom of the stairs. If you do not have them, ask someone to add them for you. What else can I do to help prevent falls? Wear shoes that: Do not have high heels. Have rubber bottoms. Are comfortable  and fit you well. Are closed at the toe. Do not wear sandals. If you use a stepladder: Make sure that it is fully opened. Do not climb a closed stepladder. Make sure that both sides of the stepladder are locked into place. Ask someone to hold it for you, if possible. Clearly mark and make sure that you can see: Any grab bars or handrails. First and last steps. Where the edge of each step is. Use tools that help you move around (mobility aids) if they are needed. These include: Canes. Walkers. Scooters. Crutches. Turn on the lights when you go into a dark area. Replace any light bulbs as soon as they burn out. Set up your furniture so you have a clear path. Avoid moving your furniture around. If any of your floors are uneven, fix them. If there are any pets around you, be aware of where they are. Review your medicines with your doctor. Some medicines can make you feel dizzy. This can increase your chance of falling. Ask your doctor what other things that you can do to help prevent falls. This information is not intended to replace advice given to you by your health care provider. Make sure you discuss any questions you have with your health care provider. Document Released: 10/24/2008 Document Revised: 06/05/2015 Document Reviewed: 02/01/2014 Elsevier Interactive Patient Education  2017 [employer].

## 2021-07-10 ENCOUNTER — Ambulatory Visit (INDEPENDENT_AMBULATORY_CARE_PROVIDER_SITE_OTHER): Payer: Medicare Other

## 2021-07-10 VITALS — Ht 68.0 in | Wt 154.0 lb

## 2021-07-10 DIAGNOSIS — Z0000 Encounter for general adult medical examination without abnormal findings: Secondary | ICD-10-CM | POA: Diagnosis not present

## 2021-07-10 NOTE — Progress Notes (Signed)
Subjective:   Donald Rios is a 81 y.o. male who presents for Medicare Annual/Subsequent preventive examination. Virtual Visit via Telephone Note  I connected with  Donald Rios on 07/10/21 at 10:30 AM EDT by telephone and verified that I am speaking with the correct person using two identifiers.  Location: Patient: HOME Provider: [facility] Persons participating in the virtual visit: patient/Nurse Health Advisor   I discussed the limitations, risks, security and privacy concerns of performing an evaluation and management service by telephone and the availability of in person appointments. The patient expressed understanding and agreed to proceed.  Interactive audio and video telecommunications were attempted between this nurse and patient, however failed, due to patient having technical difficulties OR patient did not have access to video capability.  We continued and completed visit with audio only.  Some vital signs may be absent or patient reported.   Donald Driver, LPN  Review of Systems     Cardiac Risk Factors include: advanced age (>31mn, >>41women);diabetes mellitus;hypertension;dyslipidemia;sedentary lifestyle;male gender     Objective:    Today's Vitals   07/10/21 1027  Weight: 154 lb (69.9 kg)  Height: '5\' 8"'$  (1.727 m)   Body mass index is 23.42 kg/m.     07/10/2021   10:33 AM 06/26/2020    3:05 PM 07/11/2013    9:00 AM 05/07/2013    1:22 AM  Advanced Directives  Does Patient Have a Medical Advance Directive? Yes Yes Patient has advance directive, copy not in chart Patient has advance directive, copy not in chart  Type of Advance Directive [place] will [place] will Living will [place] will  Does patient want to make changes to medical advance directive?  No - Patient declined    Copy of [place] Chart? No - copy requested Yes - validated most recent copy  scanned in chart (See row information)  Copy requested from family  Pre-existing out of facility DNR order (yellow form or pink MOST form)    No    Current Medications (verified) Outpatient Encounter Medications as of 07/10/2021  Medication Sig   allopurinol (ZYLOPRIM) 300 MG tablet TAKE ONE TABLET BY MOUTH DAILY   amLODipine (NORVASC) 5 MG tablet TAKE TWO TABLETS BY MOUTH DAILY   aspirin 81 MG tablet Take 81 mg by mouth daily.   benazepril (LOTENSIN) 40 MG tablet Take 1 tablet (40 mg total) by mouth daily.   diphenoxylate-atropine (LOMOTIL) 2.5-0.025 MG tablet Take 1 tablet by mouth 4 (four) times daily as needed for diarrhea or loose stools.   furosemide (LASIX) 40 MG tablet Take 1 tablet (40 mg total) by mouth daily.   hydrochlorothiazide (HYDRODIURIL) 25 MG tablet Take 1 tablet (25 mg total) by mouth daily.   metoprolol tartrate (LOPRESSOR) 50 MG tablet TAKE ONE AND ONE HALF TABLET BY MOUTH TWICE DAILY   mirtazapine (REMERON) 30 MG tablet TAKE ONE TABLET BY MOUTH AT BEDTIME FOR APPETITE   simvastatin (ZOCOR) 40 MG tablet TAKE ONE TABLET BY MOUTH DAILY   vitamin B-12 (CYANOCOBALAMIN) 1000 MCG tablet Take 1,000 mcg by mouth daily.   sulfamethoxazole-trimethoprim (BACTRIM DS) 800-160 MG tablet Take 1 tablet by mouth 2 (two) times daily. (Patient not taking: Reported on 07/10/2021)   No facility-administered encounter medications on file as of 07/10/2021.    Allergies (verified) Patient has no known allergies.   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    History of MI (myocardial infarction)    History of nuclear stress test 08/07/2010   dipyridamole; EKG negative for ischemia, low risk scan    Hyperlipidemia    Hyperplastic colon polyp 05/2008   Hypertension    Ischemic cardiomyopathy    EF 45%, with inferior wall motion abnormality    Myocardial infarction ([place])    Valvular regurgitation    mitral and tricuspid (mild)   Past Surgical History:  Procedure Laterality Date   CORONARY ARTERY BYPASS GRAFT  2000   LIMA to LAD, free IMA to OM2, sequential graft to PLA & PLD   ESOPHAGOGASTRODUODENOSCOPY N/A 05/07/2013   Procedure: ESOPHAGOGASTRODUODENOSCOPY (EGD);  Surgeon: Gatha Mayer, MD;  Location[facility] ENDOSCOPY;  Service: Endoscopy;  Laterality: N/A;   TRANSTHORACIC ECHOCARDIOGRAM  09/07/2010   EF 68-34%; LV systolic function mildly reduced; LA mildly dilated; mild-mod MR & mild-mod TR; aortic root sclerosis/calcification;    Family History  Problem Relation Age of Onset   Stroke Father    Hypertension Father    Stroke Maternal Grandmother    Stroke Maternal Grandfather    Diabetes Brother    Esophageal cancer Brother    Stomach cancer Maternal Aunt    Colon cancer Neg Hx    Rectal cancer Neg Hx    Social History   Socioeconomic History   Marital status: Married    Spouse name: Not on file   Number of children: 1   Years of education: Not on file   Highest education level: Not on file  Occupational History   Not on file  Tobacco Use   Smoking status: Former    Types: Cigarettes    Quit date: 11/04/1985    Years since quitting: 35.7   Smokeless tobacco: Never  Substance and Sexual Activity   Alcohol use: Yes    Alcohol/week: 2.0 - 3.0 standard drinks of alcohol    Types: 2 - 3 Cans of beer per week   Drug use: No   Sexual activity: Not on file  Other Topics Concern   Not on file  Social History Narrative   Not on file   Social Determinants of Health   Financial Resource Strain: Low Risk   (07/10/2021)   Overall Financial Resource Strain (CARDIA)    Difficulty of Paying Living Expenses: Not hard at all  Food Insecurity: No Food Insecurity (07/10/2021)   Hunger Vital Sign    Worried About Running Out of Food in the Last Year: Never true    Ran Out of Food in the Last Year: Never true  Transportation Needs: No Transportation Needs (07/10/2021)   PRAPARE - Hydrologist (Medical): No    Lack of Transportation (Non-Medical): No  Physical Activity: Sufficiently Active (07/10/2021)   Exercise Vital Sign    Days of Exercise per Week: 5 days    Minutes of Exercise per Session: 30 min  Stress: No Stress Concern Present (07/10/2021)  [place]    Feeling of Stress : Not at all  Social Connections: Socially Integrated (07/10/2021)   Social Connection and Isolation Panel [NHANES]    Frequency of Communication with Friends and Family: More than three times a week    Frequency of Social Gatherings with Friends and Family: More than three times a week    Attends Religious Services: More than 4 times per year    Active Member of [employer] or Organizations: Yes    Attends Music therapist: More than 4 times per year    Marital Status: Married    Tobacco Counseling Counseling given: Not Answered   Clinical Intake:  Pre-visit preparation completed: Yes  Pain : No/denies pain     BMI - recorded: 23.42 Nutritional Status: BMI of 19-24  Normal Nutritional Risks: None Diabetes: Yes  How often do you need to have someone help you when you read instructions, pamphlets, or other written materials from your doctor or pharmacy?: 1 - Never  Diabetic?diet controlled per pt  Interpreter Needed?: No  Information entered by :: mj Tanicia Wolaver, lpn   Activities of Daily Living    07/10/2021   10:34 AM  In your present state of health, do you have any difficulty performing the following  activities:  Hearing? 1  Vision? 0  Difficulty concentrating or making decisions? 0  Walking or climbing stairs? 0  Dressing or bathing? 0  Doing errands, shopping? 0  Preparing Food and eating ? N  Using the Toilet? N  In the past six months, have you accidently leaked urine? N  Do you have problems with loss of bowel control? N  Managing your Medications? N  Managing your Finances? N  Housekeeping or managing your Housekeeping? N    Patient Care Team: Susy Frizzle, MD as PCP - General (Family Medicine) Debara Pickett Nadean Corwin, MD as PCP - Cardiology (Cardiology) Edythe Clarity, [facility] as Pharmacist (Pharmacist)  Indicate any recent Medical Services you may have received from other than [facility] providers in the past year (date may be approximate).     Assessment:   This is a routine wellness examination for Donald Rios.  Hearing/Vision screen Hearing Screening - Comments:: Some hearing issues.  Vision Screening - Comments:: Glasses/Readers. Dr. Michail Jewels. 02/2021.  Dietary issues and exercise activities discussed: Current Exercise Habits: Home exercise routine, Type of exercise: walking;stretching, Time (Minutes): 30, Frequency (Times/Week): 5, Weekly Exercise (Minutes/Week): 150, Intensity: Mild, Exercise limited by: cardiac condition(s)   Goals Addressed             This Visit's Progress    Prevent falls   On track      Depression Screen    07/10/2021   10:30 AM 06/26/2020    3:07 PM 10/29/2019   11:06 AM 10/17/2017    7:57 AM 04/11/2017    8:01 AM 01/06/2017    9:15 AM 10/11/2016    7:55 AM  PHQ 2/9 Scores  PHQ - 2 Score 0 0 0 0 0 0 0    Fall Risk    07/10/2021   10:33 AM 06/26/2020    3:06 PM 10/29/2019   11:06 AM 12/06/2018   11:12 AM 10/17/2017    7:57 AM  Fall Risk   Falls in the past year? 0 0 0 0 No  Comment    Emmi Telephone Survey: data to providers prior to load   Number falls in past yr: 0 0 0  Injury with Fall? 0 0     Risk for fall due to : No Fall  Risks No Fall Risks     Follow up Falls prevention discussed Falls evaluation completed;Falls prevention discussed Falls evaluation completed      FALL RISK PREVENTION PERTAINING TO THE HOME:  Any stairs in or around the home? Yes  If so, are there any without handrails? No  Home free of loose throw rugs in walkways, pet beds, electrical cords, etc? Yes  Adequate lighting in your home to reduce risk of falls? Yes   ASSISTIVE DEVICES UTILIZED TO PREVENT FALLS:  Life alert? No  Use of a cane, walker or w/c? No  Grab bars in the bathroom? No  Shower chair or bench in shower? No  Elevated toilet seat or a handicapped toilet? No   TIMED UP AND GO:  Was the test performed? No .  Phone visit.   Cognitive Function:        07/10/2021   10:36 AM  6CIT Screen  What Year? 0 points  What month? 0 points  What time? 0 points  Count back from 20 0 points  Months in reverse 0 points  Repeat phrase 0 points  Total Score 0 points    Immunizations Immunization History  Administered Date(s) Administered   Fluad Quad(high Dose 65+) 10/17/2018, 10/29/2019, 12/16/2020   Influenza Whole 11/22/2002, 11/07/2003, 10/06/2009   Influenza, High Dose Seasonal PF 10/17/2017   Influenza,inj,Quad PF,6+ Mos 11/02/2012, 10/01/2013, 10/04/2014, 10/03/2015, 10/11/2016   PFIZER(Purple Top)SARS-COV-2 Vaccination 02/20/2019, 03/17/2019, 11/10/2019   Pfizer Covid-19 Vaccine Bivalent Booster 51yr & up 11/06/2020   Pneumococcal Conjugate-13 01/26/2013   Pneumococcal Polysaccharide-23 10/27/1998, 03/31/2015   Td 03/27/2009   Tdap 05/24/2011   Zoster, Live 01/03/2012    TDAP status: Up to date  Flu Vaccine status: Up to date  Pneumococcal vaccine status: Up to date  Covid-19 vaccine status: Completed vaccines  Qualifies for Shingles Vaccine? Yes   Zostavax completed Yes   Shingrix Completed?: No.    Education has been provided regarding the importance of this vaccine. Patient has been advised to  call insurance company to determine out of pocket expense if they have not yet received this vaccine. Advised may also receive vaccine at local pharmacy or Health Dept. Verbalized acceptance and understanding.  Screening Tests Health Maintenance  Topic Date Due   Zoster Vaccines- Shingrix (1 of 2) Never done   COVID-19 Vaccine (5 - Pfizer series) 03/09/2021   TETANUS/TDAP  05/23/2021   INFLUENZA VACCINE  08/11/2021   FOOT EXAM  09/08/2021   HEMOGLOBIN A1C  11/15/2021   OPHTHALMOLOGY EXAM  03/10/2022   Pneumonia Vaccine 80 Years old  Completed   Hepatitis C Screening  Completed   HPV VACCINES  Aged Out    Health Maintenance  Health Maintenance Due  Topic Date Due   Zoster Vaccines- Shingrix (1 of 2) Never done   COVID-19 Vaccine (5 - Pfizer series) 03/09/2021   TETANUS/TDAP  05/23/2021    Colorectal cancer screening: No longer required.   Lung Cancer Screening: (Low Dose CT Chest recommended if Age 80-80years, 30 pack-year currently smoking OR have quit w/in 15years.) does not qualify.   Additional Screening:  Hepatitis C Screening: does qualify; Completed 09/05/2020.  Vision Screening: Recommended annual ophthalmology exams for early detection of glaucoma and other disorders of the eye. Is the patient up to date with their annual eye exam?  Yes  Who is the provider or what is the  name of the office in which the patient attends annual eye exams? Dr. Michail Jewels If pt is not established with a provider, would they like to be referred to a provider to establish care? No .   Dental Screening: Recommended annual dental exams for proper oral hygiene  Community Resource Referral / Chronic Care Management: CRR required this visit?  No   CCM required this visit?  No      Plan:     I have personally reviewed and noted the following in the patient's chart:   Medical and social history Use of alcohol, tobacco or illicit drugs  Current medications and supplements including opioid  prescriptions. Patient is not currently taking opioid prescriptions. Functional ability and status Nutritional status Physical activity Advanced directives List of other physicians Hospitalizations, surgeries, and ER visits in previous 12 months Vitals Screenings to include cognitive, depression, and falls Referrals and appointments  In addition, I have reviewed and discussed with patient certain preventive protocols, quality metrics, and best practice recommendations. A written personalized care plan for preventive services as well as general preventive health recommendations were provided to patient.     Donald Driver, LPN   [id number]   Nurse Notes: Discussed Shingrix and how to obtain.

## 2021-07-21 DIAGNOSIS — D485 Neoplasm of uncertain behavior of skin: Secondary | ICD-10-CM | POA: Diagnosis not present

## 2021-07-21 DIAGNOSIS — L309 Dermatitis, unspecified: Secondary | ICD-10-CM | POA: Diagnosis not present

## 2021-07-21 DIAGNOSIS — Z08 Encounter for follow-up examination after completed treatment for malignant neoplasm: Secondary | ICD-10-CM | POA: Diagnosis not present

## 2021-07-21 DIAGNOSIS — L570 Actinic keratosis: Secondary | ICD-10-CM | POA: Diagnosis not present

## 2021-07-21 DIAGNOSIS — Z85820 Personal history of malignant melanoma of skin: Secondary | ICD-10-CM | POA: Diagnosis not present

## 2021-07-21 DIAGNOSIS — L814 Other melanin hyperpigmentation: Secondary | ICD-10-CM | POA: Diagnosis not present

## 2021-07-21 DIAGNOSIS — Z872 Personal history of diseases of the skin and subcutaneous tissue: Secondary | ICD-10-CM | POA: Diagnosis not present

## 2021-07-21 DIAGNOSIS — L439 Lichen planus, unspecified: Secondary | ICD-10-CM | POA: Diagnosis not present

## 2021-07-21 DIAGNOSIS — L821 Other seborrheic keratosis: Secondary | ICD-10-CM | POA: Diagnosis not present

## 2021-07-21 DIAGNOSIS — D225 Melanocytic nevi of trunk: Secondary | ICD-10-CM | POA: Diagnosis not present

## 2021-07-21 DIAGNOSIS — Z09 Encounter for follow-up examination after completed treatment for conditions other than malignant neoplasm: Secondary | ICD-10-CM | POA: Diagnosis not present

## 2021-07-31 ENCOUNTER — Ambulatory Visit (INDEPENDENT_AMBULATORY_CARE_PROVIDER_SITE_OTHER): Payer: Medicare Other | Admitting: Family Medicine

## 2021-07-31 VITALS — BP 120/60 | HR 63 | Temp 98.0°F

## 2021-07-31 DIAGNOSIS — E861 Hypovolemia: Secondary | ICD-10-CM

## 2021-07-31 DIAGNOSIS — I9589 Other hypotension: Secondary | ICD-10-CM

## 2021-07-31 DIAGNOSIS — R630 Anorexia: Secondary | ICD-10-CM

## 2021-07-31 DIAGNOSIS — D649 Anemia, unspecified: Secondary | ICD-10-CM | POA: Diagnosis not present

## 2021-07-31 DIAGNOSIS — I25812 Atherosclerosis of bypass graft of coronary artery of transplanted heart without angina pectoris: Secondary | ICD-10-CM | POA: Diagnosis not present

## 2021-07-31 LAB — CBC WITH DIFFERENTIAL/PLATELET
Absolute Monocytes: 398 cells/uL (ref 200–950)
Basophils Absolute: 32 cells/uL (ref 0–200)
Basophils Relative: 0.6 %
Eosinophils Absolute: 419 cells/uL (ref 15–500)
Eosinophils Relative: 7.9 %
HCT: 31.7 % — ABNORMAL LOW (ref 38.5–50.0)
Hemoglobin: 10.4 g/dL — ABNORMAL LOW (ref 13.2–17.1)
Lymphs Abs: 1171 cells/uL (ref 850–3900)
MCH: 31.4 pg (ref 27.0–33.0)
MCHC: 32.8 g/dL (ref 32.0–36.0)
MCV: 95.8 fL (ref 80.0–100.0)
Monocytes Relative: 7.5 %
Neutro Abs: 3281 cells/uL (ref 1500–7800)
Neutrophils Relative %: 61.9 %
RBC: 3.31 10*6/uL — ABNORMAL LOW (ref 4.20–5.80)
RDW: 14.4 % (ref 11.0–15.0)
Total Lymphocyte: 22.1 %
WBC: 5.3 10*3/uL (ref 3.8–10.8)

## 2021-07-31 LAB — BASIC METABOLIC PANEL WITH GFR
BUN: 14 mg/dL (ref 7–25)
CO2: 28 mmol/L (ref 20–32)
Calcium: 9.2 mg/dL (ref 8.6–10.3)
Chloride: 108 mmol/L (ref 98–110)
Creat: 1.11 mg/dL (ref 0.70–1.28)
Glucose, Bld: 117 mg/dL — ABNORMAL HIGH (ref 65–99)
Potassium: 4.1 mmol/L (ref 3.5–5.3)
Sodium: 143 mmol/L (ref 135–146)
eGFR: 68 mL/min/{1.73_m2} (ref >=60)

## 2021-07-31 LAB — IRON: Iron: 137 ug/dL (ref 50–180)

## 2021-07-31 NOTE — Progress Notes (Signed)
Subjective:    Patient ID: Donald Rios, male    DOB: [birth year], 82 y.o.   MRN: [id number]  Wt Readings from Last 3 Encounters:  07/10/21 154 lb (69.9 kg)  05/18/21 154 lb 9.6 oz (70.1 kg)  03/04/21 152 lb 3.2 oz (69 kg)   Please see note from February.  At that time his hemoglobin was 9.9.  We checked his stool for blood and there was no blood in his stool.  He has been taking an iron pill.  He is overdue to recheck his hemoglobin.  He continues to report no appetite but he denies any abdominal pain.  He states that he is getting dehydrated.  At times he feels weak and lightheaded especially when it is hot outside.  His blood pressure today is good at 120/60.  He is taking furosemide due to swelling in his ankles however he does not feel that the furosemide is helping with this.  He has swelling in his ankles primarily due to his amlodipine which she takes for high blood pressure but his blood pressure is actually a little low today.  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    History of MI (myocardial infarction)    History of nuclear stress test 08/07/2010   dipyridamole; EKG negative for ischemia, low risk scan    Hyperlipidemia    Hyperplastic colon polyp 05/2008   Hypertension    Ischemic cardiomyopathy    EF 45%, with inferior wall motion abnormality    Myocardial infarction ([place])    Valvular regurgitation    mitral and tricuspid (mild)   Past Surgical History:  Procedure  Laterality Date   CORONARY ARTERY BYPASS GRAFT  2000   LIMA to LAD, free IMA to OM2, sequential graft to PLA & PLD   ESOPHAGOGASTRODUODENOSCOPY N/A 05/07/2013   Procedure: ESOPHAGOGASTRODUODENOSCOPY (EGD);  Surgeon: Gatha Mayer, MD;  Location[facility] ENDOSCOPY;  Service: Endoscopy;  Laterality: N/A;   TRANSTHORACIC ECHOCARDIOGRAM  09/07/2010   EF 34-19%; LV systolic function mildly reduced; LA mildly dilated; mild-mod MR & mild-mod TR; aortic root sclerosis/calcification;    Current Outpatient Medications on File Prior to Visit  Medication Sig Dispense Refill   allopurinol (ZYLOPRIM) 300 MG tablet TAKE ONE TABLET BY MOUTH DAILY 90 tablet 1   amLODipine (NORVASC) 5 MG tablet TAKE TWO TABLETS BY MOUTH DAILY 180 tablet 3   aspirin 81 MG tablet Take 81 mg by mouth daily.     benazepril (LOTENSIN) 40 MG tablet Take 1 tablet (40 mg total) by mouth daily. 90 tablet 3   diphenoxylate-atropine (LOMOTIL) 2.5-0.025 MG tablet Take 1 tablet by mouth 4 (four) times daily as needed for diarrhea or loose stools. 30 tablet 0   fluticasone (CUTIVATE) 0.05 % cream Apply 1 Application topically daily.     furosemide (LASIX) 40 MG tablet Take 1 tablet (40 mg total) by mouth  daily. 90 tablet 3   metoprolol tartrate (LOPRESSOR) 50 MG tablet TAKE ONE AND ONE HALF TABLET BY MOUTH TWICE DAILY 270 tablet 0   mirtazapine (REMERON) 30 MG tablet TAKE ONE TABLET BY MOUTH AT BEDTIME FOR APPETITE 30 tablet 4   simvastatin (ZOCOR) 40 MG tablet TAKE ONE TABLET BY MOUTH DAILY 90 tablet 3   vitamin B-12 (CYANOCOBALAMIN) 1000 MCG tablet Take 1,000 mcg by mouth daily.     No current facility-administered medications on file prior to visit.     No Known Allergies Social History   Socioeconomic History   Marital status: Married    Spouse name: Not on file   Number of children: 1   Years of education: Not on file   Highest education level: Not on file  Occupational History   Not on file  Tobacco Use   Smoking status:  Former    Types: Cigarettes    Quit date: 11/04/1985    Years since quitting: 35.7   Smokeless tobacco: Never  Substance and Sexual Activity   Alcohol use: Yes    Alcohol/week: 2.0 - 3.0 standard drinks of alcohol    Types: 2 - 3 Cans of beer per week   Drug use: No   Sexual activity: Not on file  Other Topics Concern   Not on file  Social History Narrative   Not on file   Social Determinants of Health   Financial Resource Strain: Low Risk  (07/10/2021)   Overall Financial Resource Strain (CARDIA)    Difficulty of Paying Living Expenses: Not hard at all  Food Insecurity: No Food Insecurity (07/10/2021)   Hunger Vital Sign    Worried About Running Out of Food in the Last Year: Never true    Ran Out of Food in the Last Year: Never true  Transportation Needs: No Transportation Needs (07/10/2021)   PRAPARE - Hydrologist (Medical): No    Lack of Transportation (Non-Medical): No  Physical Activity: Sufficiently Active (07/10/2021)   Exercise Vital Sign    Days of Exercise per Week: 5 days    Minutes of Exercise per Session: 30 min  Stress: No Stress Concern Present (07/10/2021)   [place]    Feeling of Stress : Not at all  Social Connections: [place] (07/10/2021)   Social Connection and Isolation Panel [NHANES]    Frequency of Communication with Friends and Family: More than three times a week    Frequency of Social Gatherings with Friends and Family: More than three times a week    Attends Religious Services: More than 4 times per year    Active Member of [employer] or Organizations: Yes    Attends Archivist Meetings: More than 4 times per year    Marital Status: Married  Human resources officer Violence: Not At Risk (07/10/2021)   Humiliation, Afraid, Rape, and Kick questionnaire    Fear of Current or Ex-Partner: No    Emotionally Abused: No    Physically Abused: No     Sexually Abused: No     Review of Systems  All other systems reviewed and are negative.      Objective:   Physical Exam Vitals reviewed.  Constitutional:      Appearance: He is well-developed.  Neck:     Thyroid: No thyromegaly.     Vascular: No JVD.  Cardiovascular:     Rate and Rhythm: Normal rate and regular rhythm.  Heart sounds: Normal heart sounds. No murmur heard. Pulmonary:     Effort: Pulmonary effort is normal. No respiratory distress.     Breath sounds: Normal breath sounds. No wheezing or rales.  Chest:     Chest wall: No tenderness.  Abdominal:     General: Bowel sounds are normal. There is no distension.     Palpations: Abdomen is soft. There is no mass.     Tenderness: There is no abdominal tenderness. There is no guarding or rebound.  Musculoskeletal:     Cervical back: Neck supple.  Lymphadenopathy:     Cervical: No cervical adenopathy.  Skin:    Findings: No rash.           Assessment & Plan:  Anemia, unspecified type - Plan: CBC with Differential/Platelet, BASIC METABOLIC PANEL WITH GFR  Hypotension due to hypovolemia  Anorexia Firstly suggest the dehydration.  Discontinue furosemide as this is only exacerbating dehydration.  Second discontinue amlodipine as this pill is likely causing his leg swelling.  Of asked the patient to monitor his blood pressure and we will not replace the amlodipine with anything else unless his blood pressure gets above 140/90.  Third list recheck his CBC.  If his hemoglobin is not improving on iron supplement after 3 months, despite a negative stool test, I will consult GI for an EGD.  If GI work-up is negative, I will consult hematology for possible iron infusions.

## 2021-08-03 ENCOUNTER — Other Ambulatory Visit: Payer: Self-pay

## 2021-08-03 DIAGNOSIS — D649 Anemia, unspecified: Secondary | ICD-10-CM

## 2021-08-16 ENCOUNTER — Other Ambulatory Visit: Payer: Self-pay | Admitting: Family Medicine

## 2021-08-17 NOTE — Telephone Encounter (Signed)
Requested Prescriptions  Pending Prescriptions Disp Refills  . mirtazapine (REMERON) 30 MG tablet [Pharmacy Med Name: MIRTAZAPINE 30 MG TABLET] 30 tablet 4    Sig: TAKE ONE TABLET BY MOUTH AT BEDTIME FOR APPETITE     Psychiatry: Antidepressants - mirtazapine Passed - 08/17/2021  3:45 PM      Passed - Valid encounter within last 6 months    Recent Outpatient Visits          3 months ago Right carotid bruit   [place] Dennard Schaumann, Cammie Mcgee, MD   6 months ago Dehydration   [place] Dennard Schaumann, Cammie Mcgee, MD   6 months ago [place] Susy Frizzle, MD   11 months ago Type 2 diabetes mellitus without complication, without long-term current use of insulin ([place])   [place] Pickard, Cammie Mcgee, MD   1 year ago Benign essential HTN   [place], Cammie Mcgee, MD      Future Appointments            In 3 months Pickard, Cammie Mcgee, MD [place]

## 2021-08-21 ENCOUNTER — Telehealth: Payer: Self-pay

## 2021-08-21 NOTE — Telephone Encounter (Signed)
Rec' msg from pt 08/19/21 his bp is 148/68

## 2021-08-22 ENCOUNTER — Other Ambulatory Visit: Payer: Self-pay | Admitting: Family Medicine

## 2021-08-22 DIAGNOSIS — E782 Mixed hyperlipidemia: Secondary | ICD-10-CM

## 2021-08-24 ENCOUNTER — Other Ambulatory Visit: Payer: Self-pay | Admitting: Family Medicine

## 2021-08-24 NOTE — Telephone Encounter (Signed)
Requested Prescriptions  Pending Prescriptions Disp Refills  . metoprolol tartrate (LOPRESSOR) 50 MG tablet [Pharmacy Med Name: METOPROLOL TARTRATE 50 MG TAB] 270 tablet 0    Sig: TAKE 1 AND 1/2 TABLET BY MOUTH TWO TIMES A DAY     Cardiovascular:  Beta Blockers Passed - 08/22/2021  6:40 AM      Passed - Last BP in normal range    BP Readings from Last 1 Encounters:  07/31/21 120/60         Passed - Last Heart Rate in normal range    Pulse Readings from Last 1 Encounters:  07/31/21 63         Passed - Valid encounter within last 6 months    Recent Outpatient Visits          3 months ago Right carotid bruit   [place] Dennard Schaumann, Cammie Mcgee, MD   6 months ago Dehydration   [place] Dennard Schaumann, Cammie Mcgee, MD   6 months ago [place] Susy Frizzle, MD   11 months ago Type 2 diabetes mellitus without complication, without long-term current use of insulin ([place])   [place] Pickard, Cammie Mcgee, MD   1 year ago Benign essential HTN   [place] Pickard, Cammie Mcgee, MD      Future Appointments            In 2 months Pickard, Cammie Mcgee, MD [place]

## 2021-08-25 MED ORDER — DOXAZOSIN MESYLATE 2 MG PO TABS
2.0000 mg | ORAL_TABLET | Freq: Every day | ORAL | 1 refills | Status: DC
Start: 2021-08-25 — End: 2021-10-26

## 2021-08-25 NOTE — Telephone Encounter (Signed)
Called pt this afternoon, aware of Rx (doxazosin '2mg'$ ) sent to pharmacy and per Dr. Dennard Schaumann stay off lasix and amlodipine to help w/bp

## 2021-08-25 NOTE — Addendum Note (Signed)
Addended by: Colman Cater on: 08/25/2021 12:33 PM   Modules accepted: Orders

## 2021-09-05 ENCOUNTER — Other Ambulatory Visit: Payer: Self-pay | Admitting: Family Medicine

## 2021-09-05 DIAGNOSIS — E782 Mixed hyperlipidemia: Secondary | ICD-10-CM

## 2021-09-07 NOTE — Telephone Encounter (Signed)
Requested Prescriptions  Pending Prescriptions Disp Refills  . benazepril (LOTENSIN) 40 MG tablet [Pharmacy Med Name: BENAZEPRIL HCL 40 MG TABLET] 90 tablet 0    Sig: TAKE ONE TABLET BY MOUTH DAILY     Cardiovascular:  ACE Inhibitors Passed - 09/05/2021  6:41 AM      Passed - Cr in normal range and within 180 days    Creat  Date Value Ref Range Status  07/31/2021 1.11 0.70 - 1.28 mg/dL Final   Creatinine, Urine  Date Value Ref Range Status  04/08/2017 87 20 - 320 mg/dL Final         Passed - K in normal range and within 180 days    Potassium  Date Value Ref Range Status  07/31/2021 4.1 3.5 - 5.3 mmol/L Final         Passed - Patient is not pregnant      Passed - Last BP in normal range    BP Readings from Last 1 Encounters:  07/31/21 120/60         Passed - Valid encounter within last 6 months    Recent Outpatient Visits          3 months ago Right carotid bruit   [place] Dennard Schaumann, Cammie Mcgee, MD   6 months ago Dehydration   [place] Dennard Schaumann, Cammie Mcgee, MD   6 months ago [place], Warren T, MD   12 months ago Type 2 diabetes mellitus without complication, without long-term current use of insulin ([place])   [place] Pickard, Cammie Mcgee, MD   1 year ago Benign essential HTN   [place] Pickard, Cammie Mcgee, MD      Future Appointments            In 2 months Pickard, Cammie Mcgee, MD [place]

## 2021-09-21 ENCOUNTER — Telehealth: Payer: Self-pay

## 2021-09-21 DIAGNOSIS — E782 Mixed hyperlipidemia: Secondary | ICD-10-CM

## 2021-09-21 MED ORDER — BENAZEPRIL HCL 40 MG PO TABS: 40.0000 mg | ORAL_TABLET | Freq: Every day | ORAL | 0 refills | Status: DC

## 2021-09-21 NOTE — Telephone Encounter (Signed)
Pharmacy faxed a refill request for benazepril (LOTENSIN) 40 MG tablet [[id number]]    Order Details Dose: 40 mg Route: Oral Frequency: Daily  Dispense Quantity: 90 tablet Refills: 0        Sig: TAKE ONE TABLET BY MOUTH DAILY       Start Date: 09/07/21 End Date: --  Written Date: 09/07/21 Expiration Date: 09/07/22     Associated Diagnoses: Mixed hyperlipidemia [E78.2]  Original Order:  benazepril (LOTENSIN) 40 MG tablet [[id number]]

## 2021-10-26 ENCOUNTER — Other Ambulatory Visit: Payer: Self-pay

## 2021-10-26 NOTE — Telephone Encounter (Signed)
Pharmacy faxed a refill request for doxazosin (CARDURA) 2 MG tablet [[id number]]    Order Details Dose: 2 mg Route: Oral Frequency: Daily  Dispense Quantity: 30 tablet Refills: 1        Sig: Take 1 tablet (2 mg total) by mouth daily.       Start Date: 08/25/21 End Date: --  Written Date: 08/25/21 Expiration Date: 08/25/22  Providers

## 2021-10-27 MED ORDER — DOXAZOSIN MESYLATE 2 MG PO TABS: 2.0000 mg | ORAL_TABLET | Freq: Every day | ORAL | 2 refills | Status: DC

## 2021-10-27 NOTE — Telephone Encounter (Signed)
Requested Prescriptions  Pending Prescriptions Disp Refills  . doxazosin (CARDURA) 2 MG tablet 30 tablet 1    Sig: Take 1 tablet (2 mg total) by mouth daily.     Cardiovascular:  Alpha Blockers Passed - 10/26/2021  4:39 PM      Passed - Last BP in normal range    BP Readings from Last 1 Encounters:  07/31/21 120/60         Passed - Valid encounter within last 6 months    Recent Outpatient Visits          5 months ago Right carotid bruit   [place] Dennard Schaumann, Cammie Mcgee, MD   8 months ago Dehydration   [place] Dennard Schaumann, Cammie Mcgee, MD   8 months ago [place] Susy Frizzle, MD   1 year ago Type 2 diabetes mellitus without complication, without long-term current use of insulin ([place])   [place] Pickard, Cammie Mcgee, MD   1 year ago Benign essential HTN   [place] Pickard, Cammie Mcgee, MD      Future Appointments            In 2 weeks Pickard, Cammie Mcgee, MD [place]

## 2021-11-06 ENCOUNTER — Other Ambulatory Visit: Payer: Self-pay | Admitting: Family Medicine

## 2021-11-06 DIAGNOSIS — E119 Type 2 diabetes mellitus without complications: Secondary | ICD-10-CM

## 2021-11-13 ENCOUNTER — Other Ambulatory Visit: Payer: Medicare Other

## 2021-11-13 DIAGNOSIS — E782 Mixed hyperlipidemia: Secondary | ICD-10-CM

## 2021-11-13 DIAGNOSIS — K250 Acute gastric ulcer with hemorrhage: Secondary | ICD-10-CM | POA: Diagnosis not present

## 2021-11-13 DIAGNOSIS — E119 Type 2 diabetes mellitus without complications: Secondary | ICD-10-CM | POA: Diagnosis not present

## 2021-11-13 DIAGNOSIS — N289 Disorder of kidney and ureter, unspecified: Secondary | ICD-10-CM

## 2021-11-13 DIAGNOSIS — I5022 Chronic systolic (congestive) heart failure: Secondary | ICD-10-CM

## 2021-11-13 DIAGNOSIS — K449 Diaphragmatic hernia without obstruction or gangrene: Secondary | ICD-10-CM | POA: Diagnosis not present

## 2021-11-13 DIAGNOSIS — I10 Essential (primary) hypertension: Secondary | ICD-10-CM

## 2021-11-14 ENCOUNTER — Other Ambulatory Visit: Payer: Self-pay | Admitting: Family Medicine

## 2021-11-14 LAB — LIPID PANEL
Cholesterol: 106 mg/dL (ref <200)
HDL: 51 mg/dL (ref >=40)
LDL Cholesterol (Calc): 41 mg/dL (calc)
Non-HDL Cholesterol (Calc): 55 mg/dL (calc) (ref <130)
Total CHOL/HDL Ratio: 2.1 (calc) (ref <5.0)
Triglycerides: 49 mg/dL (ref <150)

## 2021-11-14 LAB — COMPLETE METABOLIC PANEL WITH GFR
AG Ratio: 1.9 (calc) (ref 1.0–2.5)
ALT: 6 U/L — ABNORMAL LOW (ref 9–46)
AST: 10 U/L (ref 10–35)
Albumin: 3.7 g/dL (ref 3.6–5.1)
Alkaline phosphatase (APISO): 58 U/L (ref 35–144)
BUN: 11 mg/dL (ref 7–25)
CO2: 27 mmol/L (ref 20–32)
Calcium: 8.9 mg/dL (ref 8.6–10.3)
Chloride: 111 mmol/L — ABNORMAL HIGH (ref 98–110)
Creat: 1.14 mg/dL (ref 0.70–1.28)
Globulin: 1.9 g/dL (calc) (ref 1.9–3.7)
Glucose, Bld: 103 mg/dL — ABNORMAL HIGH (ref 65–99)
Potassium: 4.2 mmol/L (ref 3.5–5.3)
Sodium: 145 mmol/L (ref 135–146)
Total Bilirubin: 0.9 mg/dL (ref 0.2–1.2)
Total Protein: 5.6 g/dL — ABNORMAL LOW (ref 6.1–8.1)
eGFR: 65 mL/min/{1.73_m2} (ref >=60)

## 2021-11-14 LAB — CBC WITH DIFFERENTIAL/PLATELET
Absolute Monocytes: 407 cells/uL (ref 200–950)
Basophils Absolute: 10 cells/uL (ref 0–200)
Basophils Relative: 0.2 %
Eosinophils Absolute: 240 cells/uL (ref 15–500)
Eosinophils Relative: 4.9 %
HCT: 35.2 % — ABNORMAL LOW (ref 38.5–50.0)
Hemoglobin: 11.7 g/dL — ABNORMAL LOW (ref 13.2–17.1)
Lymphs Abs: 1333 cells/uL (ref 850–3900)
MCH: 32.8 pg (ref 27.0–33.0)
MCHC: 33.2 g/dL (ref 32.0–36.0)
MCV: 98.6 fL (ref 80.0–100.0)
Monocytes Relative: 8.3 %
Neutro Abs: 2911 cells/uL (ref 1500–7800)
Neutrophils Relative %: 59.4 %
RBC: 3.57 10*6/uL — ABNORMAL LOW (ref 4.20–5.80)
RDW: 13 % (ref 11.0–15.0)
Total Lymphocyte: 27.2 %
WBC: 4.9 10*3/uL (ref 3.8–10.8)

## 2021-11-14 LAB — HEMOGLOBIN A1C
Hgb A1c MFr Bld: 5.5 % of total Hgb (ref <5.7)
Mean Plasma Glucose: 111 mg/dL
eAG (mmol/L): 6.2 mmol/L

## 2021-11-14 LAB — MICROALBUMIN / CREATININE URINE RATIO
Creatinine, Urine: 94 mg/dL (ref 20–320)
Microalb Creat Ratio: 26 mcg/mg creat (ref <30)
Microalb, Ur: 2.4 mg/dL

## 2021-11-16 ENCOUNTER — Ambulatory Visit (INDEPENDENT_AMBULATORY_CARE_PROVIDER_SITE_OTHER): Payer: Medicare Other | Admitting: Family Medicine

## 2021-11-16 VITALS — BP 130/72 | HR 67 | Ht 68.0 in | Wt 150.4 lb

## 2021-11-16 DIAGNOSIS — K449 Diaphragmatic hernia without obstruction or gangrene: Secondary | ICD-10-CM | POA: Diagnosis not present

## 2021-11-16 DIAGNOSIS — Z8679 Personal history of other diseases of the circulatory system: Secondary | ICD-10-CM | POA: Diagnosis not present

## 2021-11-16 DIAGNOSIS — E782 Mixed hyperlipidemia: Secondary | ICD-10-CM

## 2021-11-16 DIAGNOSIS — Z1211 Encounter for screening for malignant neoplasm of colon: Secondary | ICD-10-CM | POA: Diagnosis not present

## 2021-11-16 DIAGNOSIS — I10 Essential (primary) hypertension: Secondary | ICD-10-CM | POA: Diagnosis not present

## 2021-11-16 DIAGNOSIS — I25812 Atherosclerosis of bypass graft of coronary artery of transplanted heart without angina pectoris: Secondary | ICD-10-CM

## 2021-11-16 DIAGNOSIS — E119 Type 2 diabetes mellitus without complications: Secondary | ICD-10-CM

## 2021-11-16 DIAGNOSIS — K250 Acute gastric ulcer with hemorrhage: Secondary | ICD-10-CM

## 2021-11-16 DIAGNOSIS — Z23 Encounter for immunization: Secondary | ICD-10-CM | POA: Diagnosis not present

## 2021-11-16 DIAGNOSIS — L309 Dermatitis, unspecified: Secondary | ICD-10-CM | POA: Diagnosis not present

## 2021-11-16 NOTE — Progress Notes (Signed)
Subjective:    Patient ID: Donald Rios, male    DOB: 01/23/1940, 81 y.o.   MRN: [id number]  Wt Readings from Last 3 Encounters:  11/16/21 150 lb 6.4 oz (68.2 kg)  07/10/21 154 lb (69.9 kg)  05/18/21 154 lb 9.6 oz (70.1 kg)   In May, we did an ultrasound of his carotid arteries due to a bruit.  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  The bruit is still appreciated today on exam but he is on a statin and his LDL cholesterol is well below 70.  He is also taking aspirin.  Therefore we are managing this medically.  He is due for COVID booster.  He is also due for his flu shot.  He denies any chest pain shortness of breath or dyspnea on exertion.  Diabetic foot exam was performed today and shows pitting edema around his ankles and the dorsum of his foot.  The edema extends up to his lower shin.  He has no neuropathy.  He has excellent pulses.  He denies any shortness of breath or orthopnea or paroxysmal nocturnal dyspnea.  Over the summer we are treating the patient for dehydration so I have elected not to treat the minimal edema in his feet at the present time. 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    History of MI (myocardial infarction)    History of nuclear stress test 08/07/2010   dipyridamole; EKG negative for ischemia, low risk scan    Hyperlipidemia    Hyperplastic colon polyp 05/2008   Hypertension    Ischemic cardiomyopathy    EF 45%, with inferior wall motion abnormality    Myocardial infarction ([place])    Valvular  regurgitation    mitral and tricuspid (mild)   Past Surgical History:  Procedure Laterality Date   CORONARY ARTERY BYPASS GRAFT  2000   LIMA to LAD, free IMA to OM2, sequential graft to PLA & PLD   ESOPHAGOGASTRODUODENOSCOPY N/A 05/07/2013   Procedure: ESOPHAGOGASTRODUODENOSCOPY (EGD);  Surgeon: Gatha Mayer, MD;  Location[facility] END[facility]COPY;  Service: Endoscopy;  Laterality: N/A;   TRANSTHORACIC ECHOCARDIOGRAM  09/07/2010   EF 80-32%; LV systolic function mildly reduced; LA mildly dilated; mild-mod MR & mild-mod TR; aortic root sclerosis/calcification;    Current Outpatient Medications on File Prior to Visit  Medication Sig Dispense Refill   allopurinol (ZYLOPRIM) 300 MG tablet TAKE ONE TABLET BY MOUTH DAILY 90 tablet 1   aspirin 81 MG tablet Take 81 mg by mouth daily.     benazepril (LOTENSIN) 40 MG tablet Take 1 tablet (40 mg total) by mouth daily. 90 tablet 0   diphenoxylate-atropine (LOMOTIL) 2.5-0.025 MG tablet Take 1 tablet by mouth 4 (four) times daily as needed for diarrhea or loose stools. 30 tablet 0   doxazosin (CARDURA) 2 MG tablet Take 1 tablet (2 mg total) by mouth daily. 30 tablet 2   fluticasone (CUTIVATE) 0.05 %  cream Apply 1 Application topically daily.     metoprolol tartrate (LOPRESSOR) 50 MG tablet TAKE 1 AND 1/2 TABLET BY MOUTH TWO TIMES A DAY 270 tablet 0   mirtazapine (REMERON) 30 MG tablet TAKE ONE TABLET BY MOUTH AT BEDTIME FOR APPETITE 30 tablet 2   simvastatin (ZOCOR) 40 MG tablet TAKE ONE TABLET BY MOUTH DAILY 90 tablet 3   vitamin B-12 (CYANOCOBALAMIN) 1000 MCG tablet Take 1,000 mcg by mouth daily.     No current facility-administered medications on file prior to visit.     No Known Allergies Social History   Socioeconomic History   Marital status: Married    Spouse name: Not on file   Number of children: 1   Years of education: Not on file   Highest education level: Not on file  Occupational History   Not on file  Tobacco Use   Smoking status:  Former    Types: Cigarettes    Quit date: 11/04/1985    Years since quitting: 36.0   Smokeless tobacco: Never  Substance and Sexual Activity   Alcohol use: Yes    Alcohol/week: 2.0 - 3.0 standard drinks of alcohol    Types: 2 - 3 Cans of beer per week   Drug use: No   Sexual activity: Not on file  Other Topics Concern   Not on file  Social History Narrative   Not on file   Social Determinants of Health   Financial Resource Strain: Low Risk  (07/10/2021)   Overall Financial Resource Strain (CARDIA)    Difficulty of Paying Living Expenses: Not hard at all  Food Insecurity: No Food Insecurity (07/10/2021)   Hunger Vital Sign    Worried About Running Out of Food in the Last Year: Never true    Ran Out of Food in the Last Year: Never true  Transportation Needs: No Transportation Needs (07/10/2021)   PRAPARE - Hydrologist (Medical): No    Lack of Transportation (Non-Medical): No  Physical Activity: Sufficiently Active (07/10/2021)   Exercise Vital Sign    Days of Exercise per Week: 5 days    Minutes of Exercise per Session: 30 min  Stress: No Stress Concern Present (07/10/2021)   [place]    Feeling of Stress : Not at all  Social Connections: [place] (07/10/2021)   Social Connection and Isolation Panel [NHANES]    Frequency of Communication with Friends and Family: More than three times a week    Frequency of Social Gatherings with Friends and Family: More than three times a week    Attends Religious Services: More than 4 times per year    Active Member of [employer] or Organizations: Yes    Attends Archivist Meetings: More than 4 times per year    Marital Status: Married  Human resources officer Violence: Not At Risk (07/10/2021)   Humiliation, Afraid, Rape, and Kick questionnaire    Fear of Current or Ex-Partner: No    Emotionally Abused: No    Physically Abused: No     Sexually Abused: No     Review of Systems  All other systems reviewed and are negative.      Objective:   Physical Exam Vitals reviewed.  Constitutional:      Appearance: He is well-developed.  Neck:     Thyroid: No thyromegaly.     Vascular: No JVD.  Cardiovascular:     Rate and Rhythm:  Normal rate and regular rhythm.     Heart sounds: Normal heart sounds. No murmur heard. Pulmonary:     Effort: Pulmonary effort is normal. No respiratory distress.     Breath sounds: Normal breath sounds. No wheezing or rales.  Chest:     Chest wall: No tenderness.  Abdominal:     General: Bowel sounds are normal. There is no distension.     Palpations: Abdomen is soft. There is no mass.     Tenderness: There is no abdominal tenderness. There is no guarding or rebound.  Musculoskeletal:     Cervical back: Neck supple.  Lymphadenopathy:     Cervical: No cervical adenopathy.  Skin:    Findings: No rash.    Lab on 11/13/2021  Component Date Value Ref Range Status   Hgb A1c MFr Bld 11/13/2021 5.5  <5.7 % of total Hgb Final   Comment: For the purpose of screening for the presence of diabetes: . <5.7%       Consistent with the absence of diabetes 5.7-6.4%    Consistent with increased risk for diabetes             (prediabetes) > or =6.5%  Consistent with diabetes . This assay result is consistent with a decreased risk of diabetes. . Currently, no consensus exists regarding use of hemoglobin A1c for diagnosis of diabetes in children. . According to American Diabetes Association (ADA) guidelines, hemoglobin A1c <7.0% represents optimal control in non-pregnant diabetic patients. Different metrics may apply to specific patient populations.  Standards of Medical Care in Diabetes(ADA). .    Mean Plasma Glucose 11/13/2021 111  mg/dL Final   eAG (mmol/L) 11/13/2021 6.2  mmol/L Final   Cholesterol 11/13/2021 106  <200 mg/dL Final   HDL 11/13/2021 51  > OR = 40 mg/dL Final    Triglycerides 11/13/2021 49  <150 mg/dL Final   LDL Cholesterol (Calc) 11/13/2021 41  mg/dL (calc) Final   Comment: Reference range: <100 . Desirable range <100 mg/dL for primary prevention;   <70 mg/dL for patients with CHD or diabetic patients  with > or = 2 CHD risk factors. Marland Kitchen LDL-C is now calculated using the Martin-Hopkins  calculation, which is a validated novel method providing  better accuracy than the Friedewald equation in the  estimation of LDL-C.  Cresenciano Genre et al. Annamaria Helling. 0932;671(24): 2061-2068  (http://education.[facility].com/faq/FAQ164)    Total CHOL/HDL Ratio 11/13/2021 2.1  <5.0 (calc) Final   Non-HDL Cholesterol (Calc) 11/13/2021 55  <130 mg/dL (calc) Final   Comment: For patients with diabetes plus 1 major ASCVD risk  factor, treating to a non-HDL-C goal of <100 mg/dL  (LDL-C of <70 mg/dL) is considered a therapeutic  option.    Glucose, Bld 11/13/2021 103 (H)  65 - 99 mg/dL Final   Comment: .            Fasting reference interval . For someone without known diabetes, a glucose value between 100 and 125 mg/dL is consistent with prediabetes and should be confirmed with a follow-up test. .    BUN 11/13/2021 11  7 - 25 mg/dL Final   Creat 11/13/2021 1.14  0.70 - 1.28 mg/dL Final   eGFR 11/13/2021 65  > OR = 60 mL/min/1.84m Final   BUN/Creatinine Ratio 11/13/2021 SEE NOTE:  6 - 22 (calc) Final   Comment:    Not Reported: BUN and Creatinine are within    reference range. .    Sodium 11/13/2021 145  135 - 146  mmol/L Final   Potassium 11/13/2021 4.2  3.5 - 5.3 mmol/L Final   Chloride 11/13/2021 111 (H)  98 - 110 mmol/L Final   CO2 11/13/2021 27  20 - 32 mmol/L Final   Calcium 11/13/2021 8.9  8.6 - 10.3 mg/dL Final   Total Protein 11/13/2021 5.6 (L)  6.1 - 8.1 g/dL Final   Albumin 11/13/2021 3.7  3.6 - 5.1 g/dL Final   Globulin 11/13/2021 1.9  1.9 - 3.7 g/dL (calc) Final   AG Ratio 11/13/2021 1.9  1.0 - 2.5 (calc) Final   Total Bilirubin 11/13/2021  0.9  0.2 - 1.2 mg/dL Final   Alkaline phosphatase (APISO) 11/13/2021 58  35 - 144 U/L Final   AST 11/13/2021 10  10 - 35 U/L Final   ALT 11/13/2021 6 (L)  9 - 46 U/L Final   WBC 11/13/2021 4.9  3.8 - 10.8 Thousand/uL Final   RBC 11/13/2021 3.57 (L)  4.20 - 5.80 Million/uL Final   Hemoglobin 11/13/2021 11.7 (L)  13.2 - 17.1 g/dL Final   HCT 11/13/2021 35.2 (L)  38.5 - 50.0 % Final   MCV 11/13/2021 98.6  80.0 - 100.0 fL Final   MCH 11/13/2021 32.8  27.0 - 33.0 pg Final   MCHC 11/13/2021 33.2  32.0 - 36.0 g/dL Final   RDW 11/13/2021 13.0  11.0 - 15.0 % Final   Platelets 11/13/2021 CANCELED   Final   Comment: Unable to report due to significant platelet clumping.  Result canceled by the ancillary.    Neutro Abs 11/13/2021 2,911  1,500 - 7,800 cells/uL Final   Lymphs Abs 11/13/2021 1,333  850 - 3,900 cells/uL Final   Absolute Monocytes 11/13/2021 407  200 - 950 cells/uL Final   Eosinophils Absolute 11/13/2021 240  15 - 500 cells/uL Final   Basophils Absolute 11/13/2021 10  0 - 200 cells/uL Final   Neutrophils Relative % 11/13/2021 59.4  % Final   Total Lymphocyte 11/13/2021 27.2  % Final   Monocytes Relative 11/13/2021 8.3  % Final   Eosinophils Relative 11/13/2021 4.9  % Final   Basophils Relative 11/13/2021 0.2  % Final   Smear Review 11/13/2021    Final   Comment: Giant platelets noted. Review of peripheral smear confirms automated results.    Creatinine, Urine 11/13/2021 94  20 - 320 mg/dL Final   Microalb, Ur 11/13/2021 2.4  mg/dL Final   Comment: Reference Range Not established    Microalb Creat Ratio 11/13/2021 26  <30 mcg/mg creat Final   Comment: . The ADA defines abnormalities in albumin excretion as follows: Marland Kitchen Albuminuria Category        Result (mcg/mg creatinine) . Normal to Mildly increased   <30 Moderately increased         30-299  Severely increased           > OR = 300 . The ADA recommends that at least two of three specimens collected within a 3-6 month  period be abnormal before considering a patient to be within a diagnostic category.           Assessment & Plan:  Mixed hyperlipidemia  Benign essential HTN  Type 2 diabetes mellitus without complication, without long-term current use of insulin (HCC)  History of ASCVD Patient received his flu shot today and I recommended the patient receive the COVID booster.  His diabetes test is excellent at 5.5.  His LDL cholesterol is well below 70 as it is in the low 40s.  His liver and kidney test are normal.  I did encourage the patient to try to take a protein supplement due to the low protein levels of his blood.  His hemoglobin has improved from 10.4-11.7 I would continue the iron supplement.  Otherwise he is doing well with no concerns.  Blood pressure today is excellent.

## 2021-11-16 NOTE — Addendum Note (Signed)
Addended by: Randal Buba K on: 11/16/2021 08:32 AM   Modules accepted: Orders

## 2021-11-16 NOTE — Telephone Encounter (Signed)
Requested Prescriptions  Pending Prescriptions Disp Refills   mirtazapine (REMERON) 30 MG tablet [Pharmacy Med Name: MIRTAZAPINE 30 MG TABLET] 30 tablet 2    Sig: TAKE ONE TABLET BY MOUTH EVERY AT BEDTIME FOR APPETITE     Psychiatry: Antidepressants - mirtazapine Passed - 11/14/2021  6:41 AM      Passed - Valid encounter within last 6 months    Recent Outpatient Visits           6 months ago Right carotid bruit   [place] [facility] Schaumann, Cammie Mcgee, MD   9 months ago Dehydration   [place] [facility] Schaumann, Cammie Mcgee, MD   9 months ago [place] Susy Frizzle, MD   1 year ago Type 2 diabetes mellitus without complication, without long-term current use of insulin ([place])   [place] Pickard, Cammie Mcgee, MD   1 year ago Benign essential HTN   [place], Cammie Mcgee, MD       Future Appointments             In 6 months Pickard, Cammie Mcgee, MD [place]

## 2021-11-18 NOTE — Addendum Note (Signed)
Addended by: Randal Buba K on: 11/18/2021 09:51 AM   Modules accepted: Orders

## 2021-11-20 ENCOUNTER — Other Ambulatory Visit: Payer: Self-pay | Admitting: Family Medicine

## 2021-11-20 DIAGNOSIS — E782 Mixed hyperlipidemia: Secondary | ICD-10-CM

## 2021-11-20 NOTE — Telephone Encounter (Signed)
Requested Prescriptions  Pending Prescriptions Disp Refills   metoprolol tartrate (LOPRESSOR) 50 MG tablet [Pharmacy Med Name: METOPROLOL TARTRATE 50 MG TAB] 270 tablet 0    Sig: TAKE 1 AND 1/2 TABLET BY MOUTH TWO TIMES A DAY     Cardiovascular:  Beta Blockers Failed - 11/20/2021  6:10 AM      Failed - Valid encounter within last 6 months    Recent Outpatient Visits           6 months ago Right carotid bruit   [place] Denn[facility] Schaumann, Cammie Mcgee, MD   9 months ago Dehydration   [place] Denn[facility] Schaumann, Cammie Mcgee, MD   9 months ago [place] Susy Frizzle, MD   1 year ago Type 2 diabetes mellitus without complication, without long-term current use of insulin ([place])   [place] Pickard, Cammie Mcgee, MD   1 year ago Benign essential HTN   [place] Pickard, Cammie Mcgee, MD       Future Appointments             In 5 months Pickard, Cammie Mcgee, MD [place], [facility]            Passed - Last BP in normal range    BP Readings from Last 1 Encounters:  11/16/21 130/72         Passed - Last Heart Rate in normal range    Pulse Readings from Last 1 Encounters:  11/16/21 67

## 2021-12-31 ENCOUNTER — Other Ambulatory Visit (HOSPITAL_BASED_OUTPATIENT_CLINIC_OR_DEPARTMENT_OTHER): Payer: Self-pay

## 2021-12-31 DIAGNOSIS — Z23 Encounter for immunization: Secondary | ICD-10-CM | POA: Diagnosis not present

## 2021-12-31 MED ORDER — COVID-19 MRNA VAC-TRIS(PFIZER) 30 MCG/0.3ML IM SUSY
0.3000 mL | PREFILLED_SYRINGE | Freq: Once | INTRAMUSCULAR | 0 refills | Status: AC
  Filled 2021-12-31: qty 0.3, 1d supply, fill #0

## 2022-01-25 ENCOUNTER — Telehealth: Payer: Self-pay | Admitting: Family Medicine

## 2022-01-25 ENCOUNTER — Other Ambulatory Visit: Payer: Self-pay

## 2022-01-25 DIAGNOSIS — I10 Essential (primary) hypertension: Secondary | ICD-10-CM

## 2022-01-25 MED ORDER — DOXAZOSIN MESYLATE 2 MG PO TABS: 2.0000 mg | ORAL_TABLET | Freq: Every day | ORAL | 2 refills | Status: DC

## 2022-01-25 NOTE — Telephone Encounter (Signed)
Prescription Request  01/25/2022  Is this a "Controlled Substance" medicine? No  LOV: 11/16/2021  What is the name of the medication or equipment?   doxazosin (CARDURA) 2 MG tablet   Have you contacted your pharmacy to request a refill? Yes   Which pharmacy would you like this sent to?  [place] [id number], [place] [place] [address] Phone: [phone] Fax: [phone]    Patient notified that their request is being sent to the clinical staff for review and that they should receive a response within 2 business days.   Please advise pharmacist.

## 2022-01-29 DIAGNOSIS — L821 Other seborrheic keratosis: Secondary | ICD-10-CM | POA: Diagnosis not present

## 2022-01-29 DIAGNOSIS — L814 Other melanin hyperpigmentation: Secondary | ICD-10-CM | POA: Diagnosis not present

## 2022-01-29 DIAGNOSIS — D225 Melanocytic nevi of trunk: Secondary | ICD-10-CM | POA: Diagnosis not present

## 2022-01-29 DIAGNOSIS — Z08 Encounter for follow-up examination after completed treatment for malignant neoplasm: Secondary | ICD-10-CM | POA: Diagnosis not present

## 2022-01-29 DIAGNOSIS — L304 Erythema intertrigo: Secondary | ICD-10-CM | POA: Diagnosis not present

## 2022-01-29 DIAGNOSIS — Z85820 Personal history of malignant melanoma of skin: Secondary | ICD-10-CM | POA: Diagnosis not present

## 2022-01-29 DIAGNOSIS — Z872 Personal history of diseases of the skin and subcutaneous tissue: Secondary | ICD-10-CM | POA: Diagnosis not present

## 2022-01-29 DIAGNOSIS — Z09 Encounter for follow-up examination after completed treatment for conditions other than malignant neoplasm: Secondary | ICD-10-CM | POA: Diagnosis not present

## 2022-02-16 ENCOUNTER — Other Ambulatory Visit: Payer: Self-pay | Admitting: Family Medicine

## 2022-02-16 DIAGNOSIS — E782 Mixed hyperlipidemia: Secondary | ICD-10-CM

## 2022-02-17 NOTE — Telephone Encounter (Signed)
Requested Prescriptions  Pending Prescriptions Disp Refills   metoprolol tartrate (LOPRESSOR) 50 MG tablet [Pharmacy Med Name: METOPROLOL TARTRATE 50 MG TAB] 270 tablet 1    Sig: TAKE 1 AND 1/2 TABLET BY MOUTH TWO TIMES A DAY     Cardiovascular:  Beta Blockers Failed - 02/16/2022  6:10 AM      Failed - Valid encounter within last 6 months    Recent Outpatient Visits           9 months ago Right carotid bruit   [place] Pickard, Cammie Mcgee, MD   1 year ago Dehydration   [place] [facility] Pickard, Cammie Mcgee, MD   1 year ago [place] Susy Frizzle, MD   1 year ago Type 2 diabetes mellitus without complication, without long-term current use of insulin ([place])   [place] Pickard, Cammie Mcgee, MD   1 year ago Benign essential HTN   [place] Pickard, Cammie Mcgee, MD       Future Appointments             In 2 months Pickard, Cammie Mcgee, MD [facility]            Passed - Last BP in normal range    BP Readings from Last 1 Encounters:  11/16/21 130/72         Passed - Last Heart Rate in normal range    Pulse Readings from Last 1 Encounters:  11/16/21 67

## 2022-03-02 ENCOUNTER — Other Ambulatory Visit: Payer: Self-pay | Admitting: Family Medicine

## 2022-03-08 ENCOUNTER — Other Ambulatory Visit: Payer: Self-pay

## 2022-03-08 DIAGNOSIS — E782 Mixed hyperlipidemia: Secondary | ICD-10-CM

## 2022-03-08 NOTE — Telephone Encounter (Signed)
Prescription Request  03/08/2022  Is this a "Controlled Substance" medicine? No  LOV: 11/16/21  What is the name of the medication or equipment? benazepril (LOTENSIN) 40 MG tablet [id number]  Have you contacted your pharmacy to request a refill? Yes   Which pharmacy would you like this sent to?  [place] QI:[id number], [place] [address] Phone: [phone] Fax: [phone]    Patient notified that their request is being sent to the clinical staff for review and that they should receive a response within 2 business days.   Please advise at [facility] [phone]

## 2022-03-09 MED ORDER — BENAZEPRIL HCL 40 MG PO TABS: 40.0000 mg | ORAL_TABLET | Freq: Every day | ORAL | 0 refills | Status: DC

## 2022-03-09 NOTE — Telephone Encounter (Signed)
OV- 11/16/21- has upcoming visit on schedule 05/17/22 Requested Prescriptions  Pending Prescriptions Disp Refills   benazepril (LOTENSIN) 40 MG tablet 90 tablet 0    Sig: Take 1 tablet (40 mg total) by mouth daily.     Cardiovascular:  ACE Inhibitors Failed - 03/08/2022  9:51 AM      Failed - Valid encounter within last 6 months    Recent Outpatient Visits           9 months ago Right carotid bruit   [place] Pickard, Cammie Mcgee, MD   1 year ago Dehydration   [place] Pickard, Cammie Mcgee, MD   1 year ago [place] Susy Frizzle, MD   1 year ago Type 2 diabetes mellitus without complication, without long-term current use of insulin ([place])   [place] Susy Frizzle, MD   1 year ago Benign essential HTN   [place] Susy Frizzle, MD       Future Appointments             In 2 months Pickard, Cammie Mcgee, MD [place]            Passed - Cr in normal range and within 180 days    Creat  Date Value Ref Range Status  11/13/2021 1.14 0.70 - 1.28 mg/dL Final   Creatinine, Urine  Date Value Ref Range Status  11/13/2021 94 20 - 320 mg/dL Final         Passed - K in normal range and within 180 days    Potassium  Date Value Ref Range Status  11/13/2021 4.2 3.5 - 5.3 mmol/L Final         Passed - Patient is not pregnant      Passed - Last BP in normal range    BP Readings from Last 1 Encounters:  11/16/21 130/72

## 2022-03-10 DIAGNOSIS — H2513 Age-related nuclear cataract, bilateral: Secondary | ICD-10-CM | POA: Diagnosis not present

## 2022-03-10 DIAGNOSIS — H524 Presbyopia: Secondary | ICD-10-CM | POA: Diagnosis not present

## 2022-03-10 DIAGNOSIS — E119 Type 2 diabetes mellitus without complications: Secondary | ICD-10-CM | POA: Diagnosis not present

## 2022-03-10 LAB — HM DIABETES EYE EXAM

## 2022-03-16 ENCOUNTER — Encounter: Payer: Self-pay | Admitting: Family Medicine

## 2022-03-16 ENCOUNTER — Other Ambulatory Visit: Payer: Self-pay | Admitting: Family Medicine

## 2022-03-16 NOTE — Telephone Encounter (Signed)
Prescription Request  03/16/2022  LOV: 11/16/2021  What is the name of the medication or equipment?   furosemide (LASIX) 40 MG tablet [id number]  DISCONTINUED   Have you contacted your pharmacy to request a refill? Yes   Which pharmacy would you like this sent to?  [place] QI:[id number], [place] [address] Phone: [phone] Fax: [phone]    Patient notified that their request is being sent to the clinical staff for review and that they should receive a response within 2 business days.   Please advise pharmacist.

## 2022-03-16 NOTE — Telephone Encounter (Signed)
Requested Prescriptions  Pending Prescriptions Disp Refills   furosemide (LASIX) 40 MG tablet 90 tablet 3    Sig: Take 1 tablet (40 mg total) by mouth daily.     Cardiovascular:  Diuretics - Loop Failed - 03/16/2022  1:37 PM      Failed - Cl in normal range and within 180 days    Chloride  Date Value Ref Range Status  11/13/2021 111 (H) 98 - 110 mmol/L Final         Failed - Mg Level in normal range and within 180 days    No results found for: "MG"       Failed - Valid encounter within last 6 months    Recent Outpatient Visits           10 months ago Right carotid bruit   [place] Pickard, Cammie Mcgee, MD   1 year ago Dehydration   [place] Susy Frizzle, MD   1 year ago [place] Susy Frizzle, MD   1 year ago Type 2 diabetes mellitus without complication, without long-term current use of insulin ([place])   [place] Pickard, Cammie Mcgee, MD   1 year ago Benign essential HTN   [place] Pickard, Cammie Mcgee, MD       Future Appointments             In 2 months Pickard, Cammie Mcgee, MD [facility]            Passed - K in normal range and within 180 days    Potassium  Date Value Ref Range Status  11/13/2021 4.2 3.5 - 5.3 mmol/L Final         Passed - Ca in normal range and within 180 days    Calcium  Date Value Ref Range Status  11/13/2021 8.9 8.6 - 10.3 mg/dL Final         Passed - Na in normal range and within 180 days    Sodium  Date Value Ref Range Status  11/13/2021 145 135 - 146 mmol/L Final         Passed - Cr in normal range and within 180 days    Creat  Date Value Ref Range Status  11/13/2021 1.14 0.70 - 1.28 mg/dL Final   Creatinine, Urine  Date Value Ref Range Status  11/13/2021 94 20 - 320 mg/dL Final         Passed - Last BP in normal range    BP Readings from Last 1 Encounters:  11/16/21 130/72

## 2022-04-02 DIAGNOSIS — H2513 Age-related nuclear cataract, bilateral: Secondary | ICD-10-CM | POA: Diagnosis not present

## 2022-04-20 DIAGNOSIS — H2511 Age-related nuclear cataract, right eye: Secondary | ICD-10-CM | POA: Diagnosis not present

## 2022-04-20 DIAGNOSIS — H269 Unspecified cataract: Secondary | ICD-10-CM | POA: Diagnosis not present

## 2022-05-01 ENCOUNTER — Other Ambulatory Visit: Payer: Self-pay | Admitting: Family Medicine

## 2022-05-01 DIAGNOSIS — I10 Essential (primary) hypertension: Secondary | ICD-10-CM

## 2022-05-03 ENCOUNTER — Other Ambulatory Visit: Payer: Self-pay | Admitting: Family Medicine

## 2022-05-03 DIAGNOSIS — E119 Type 2 diabetes mellitus without complications: Secondary | ICD-10-CM

## 2022-05-03 NOTE — Telephone Encounter (Signed)
Requested Prescriptions  Pending Prescriptions Disp Refills   doxazosin (CARDURA) 2 MG tablet [Pharmacy Med Name: DOXAZOSIN MESYLATE 2 MG TAB] 90 tablet 0    Sig: TAKE 1 TABLET BY MOUTH DAILY     Cardiovascular:  Alpha Blockers Failed - 05/01/2022  6:40 AM      Failed - Valid encounter within last 6 months    Recent Outpatient Visits           11 months ago Right carotid bruit   [facility] Pickard, Priscille Heidelberg, MD   1 year ago Dehydration   [facility] Pickard, Priscille Heidelberg, MD   1 year ago Dysuria   [facility] [facility] [name], MD   1 year ago Type 2 diabetes mellitus without complication, without long-term current use of insulin (HCC)   [facility] Pickard, Priscille Heidelberg, MD   1 year ago Benign essential HTN   [facility] Pickard, Priscille Heidelberg, MD       Future Appointments             In 2 weeks Pickard, Priscille Heidelberg, MD [place] [facility]            Passed - Last BP in normal range    BP Readings from Last 1 Encounters:  11/16/21 130/72

## 2022-05-04 DIAGNOSIS — H2512 Age-related nuclear cataract, left eye: Secondary | ICD-10-CM | POA: Diagnosis not present

## 2022-05-04 DIAGNOSIS — H269 Unspecified cataract: Secondary | ICD-10-CM | POA: Diagnosis not present

## 2022-05-14 ENCOUNTER — Other Ambulatory Visit: Payer: Medicare Other

## 2022-05-14 DIAGNOSIS — E782 Mixed hyperlipidemia: Secondary | ICD-10-CM | POA: Diagnosis not present

## 2022-05-14 DIAGNOSIS — E119 Type 2 diabetes mellitus without complications: Secondary | ICD-10-CM

## 2022-05-14 DIAGNOSIS — I10 Essential (primary) hypertension: Secondary | ICD-10-CM | POA: Diagnosis not present

## 2022-05-14 LAB — CBC WITH DIFFERENTIAL/PLATELET
Absolute Monocytes: 602 cells/uL (ref 200–950)
Lymphs Abs: 1594 cells/uL (ref 850–3900)
MCHC: 32.2 g/dL (ref 32.0–36.0)
MCV: 98.6 fL (ref 80.0–100.0)
MPV: 12.4 fL (ref 7.5–12.5)
Neutro Abs: 3853 cells/uL (ref 1500–7800)

## 2022-05-15 ENCOUNTER — Other Ambulatory Visit: Payer: Self-pay | Admitting: Family Medicine

## 2022-05-15 DIAGNOSIS — E782 Mixed hyperlipidemia: Secondary | ICD-10-CM

## 2022-05-15 DIAGNOSIS — E119 Type 2 diabetes mellitus without complications: Secondary | ICD-10-CM

## 2022-05-15 LAB — CBC WITH DIFFERENTIAL/PLATELET
Basophils Absolute: 32 cells/uL (ref 0–200)
Basophils Relative: 0.5 %
Eosinophils Absolute: 320 cells/uL (ref 15–500)
Eosinophils Relative: 5 %
HCT: 34.8 % — ABNORMAL LOW (ref 38.5–50.0)
Hemoglobin: 11.2 g/dL — ABNORMAL LOW (ref 13.2–17.1)
MCH: 31.7 pg (ref 27.0–33.0)
Monocytes Relative: 9.4 %
Neutrophils Relative %: 60.2 %
Platelets: 137 10*3/uL — ABNORMAL LOW (ref 140–400)
RBC: 3.53 10*6/uL — ABNORMAL LOW (ref 4.20–5.80)
RDW: 14.5 % (ref 11.0–15.0)
Total Lymphocyte: 24.9 %
WBC: 6.4 10*3/uL (ref 3.8–10.8)

## 2022-05-15 LAB — COMPLETE METABOLIC PANEL WITH GFR
AG Ratio: 2.3 (calc) (ref 1.0–2.5)
ALT: 9 U/L (ref 9–46)
AST: 13 U/L (ref 10–35)
Albumin: 4.1 g/dL (ref 3.6–5.1)
Alkaline phosphatase (APISO): 64 U/L (ref 35–144)
BUN/Creatinine Ratio: 16 (calc) (ref 6–22)
BUN: 20 mg/dL (ref 7–25)
CO2: 28 mmol/L (ref 20–32)
Calcium: 9.1 mg/dL (ref 8.6–10.3)
Chloride: 108 mmol/L (ref 98–110)
Creat: 1.23 mg/dL — ABNORMAL HIGH (ref 0.70–1.22)
Globulin: 1.8 g/dL (calc) — ABNORMAL LOW (ref 1.9–3.7)
Glucose, Bld: 112 mg/dL — ABNORMAL HIGH (ref 65–99)
Potassium: 4.1 mmol/L (ref 3.5–5.3)
Sodium: 143 mmol/L (ref 135–146)
Total Bilirubin: 1.2 mg/dL (ref 0.2–1.2)
Total Protein: 5.9 g/dL — ABNORMAL LOW (ref 6.1–8.1)
eGFR: 59 mL/min/{1.73_m2} — ABNORMAL LOW (ref >=60)

## 2022-05-15 LAB — LIPID PANEL
Cholesterol: 115 mg/dL (ref <200)
HDL: 57 mg/dL (ref >=40)
LDL Cholesterol (Calc): 46 mg/dL (calc)
Non-HDL Cholesterol (Calc): 58 mg/dL (calc) (ref <130)
Total CHOL/HDL Ratio: 2 (calc) (ref <5.0)
Triglycerides: 42 mg/dL (ref <150)

## 2022-05-15 LAB — HEMOGLOBIN A1C
Hgb A1c MFr Bld: 5.6 % of total Hgb (ref <5.7)
Mean Plasma Glucose: 114 mg/dL
eAG (mmol/L): 6.3 mmol/L

## 2022-05-17 ENCOUNTER — Encounter: Payer: Self-pay | Admitting: Family Medicine

## 2022-05-17 ENCOUNTER — Ambulatory Visit (INDEPENDENT_AMBULATORY_CARE_PROVIDER_SITE_OTHER): Payer: Medicare Other | Admitting: Family Medicine

## 2022-05-17 VITALS — BP 122/58 | HR 74 | Temp 97.8°F | Ht 68.0 in | Wt 153.6 lb

## 2022-05-17 DIAGNOSIS — E119 Type 2 diabetes mellitus without complications: Secondary | ICD-10-CM | POA: Diagnosis not present

## 2022-05-17 DIAGNOSIS — Z8679 Personal history of other diseases of the circulatory system: Secondary | ICD-10-CM | POA: Diagnosis not present

## 2022-05-17 DIAGNOSIS — I10 Essential (primary) hypertension: Secondary | ICD-10-CM | POA: Diagnosis not present

## 2022-05-17 NOTE — Progress Notes (Signed)
Subjective:    Patient ID: Donald Rios, male    DOB: 09-May-1940, 82 y.o.   MRN: [id number]  Wt Readings from Last 3 Encounters:  05/17/22 153 lb 9.6 oz (69.7 kg)  11/16/21 150 lb 6.4 oz (68.2 kg)  07/10/21 154 lb (69.9 kg)   Patient is here today for a checkup.  His blood pressure looks outstanding.  He has a history of prediabetes.  He is been off all diabetes medication now for several years.  His A1c remains outstanding at 5.6 although his fasting blood sugar is mildly elevated at 112.  The today is 1.1 with a GFR over 60.  Slightly depressed today and I encouraged him to drink more water.  His cholesterol is outstanding.  He denies any chest pain shortness of breath or dyspnea on exertion.  He is mildly anemic however this is a chronic finding for the patient.  His hemoglobin typically ranges between 11 and 12 has been in that range for the last 4 years.  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    History of MI (myocardial infarction)    History of nuclear stress test 08/07/2010   dipyridamole; EKG negative for ischemia, low risk scan    Hyperlipidemia    Hyperplastic colon polyp 05/2008   Hypertension    Ischemic cardiomyopathy    EF 45%, with inferior wall motion abnormality    Myocardial infarction (HCC)    Valvular regurgitation    mitral and tricuspid (mild)   Past Surgical History:  Procedure Laterality Date   CORONARY ARTERY BYPASS GRAFT  2000    LIMA to LAD, free IMA to OM2, sequential graft to PLA & PLD   ESOPHAGOGASTRODUODENOSCOPY N/A 05/07/2013   Procedure: ESOPHAGOGASTRODUODENOSCOPY (EGD);  Surgeon: Iva Boop, MD;  [facility];  Service: Endoscopy;  Laterality: N/A;   TRANSTHORACIC ECHOCARDIOGRAM  09/07/2010   EF 45-50%; LV systolic function mildly reduced; LA mildly dilated; mild-mod MR & mild-mod TR; aortic root sclerosis/calcification;    Current Outpatient Medications on File Prior to Visit  Medication Sig Dispense Refill   allopurinol (ZYLOPRIM) 300 MG tablet TAKE 1 TABLET BY MOUTH DAILY 90 tablet 1   aspirin 81 MG tablet Take 81 mg by mouth daily.     benazepril (LOTENSIN) 40 MG tablet Take 1 tablet (40 mg total) by mouth daily. 90 tablet 0   diphenoxylate-atropine (LOMOTIL) 2.5-0.025 MG tablet Take 1 tablet by mouth 4 (four) times daily as needed for diarrhea or loose stools. 30 tablet 0   doxazosin (CARDURA) 2 MG tablet TAKE 1 TABLET BY MOUTH DAILY 90 tablet 0   metoprolol tartrate (LOPRESSOR) 50 MG tablet TAKE 1 AND 1/2 TABLET BY MOUTH TWO TIMES A DAY 270 tablet 1   mirtazapine (REMERON) 30 MG tablet TAKE ONE TABLET BY MOUTH EVERY NIGHT AT BEDTIME FOR APPETITE 30  tablet 2   simvastatin (ZOCOR) 40 MG tablet TAKE ONE TABLET BY MOUTH DAILY 90 tablet 3   No current facility-administered medications on file prior to visit.     No Known Allergies Social History   Socioeconomic History   Marital status: Married    Spouse name: Not on file   Number of children: 1   Years of education: Not on file   Highest education level: Not on file  Occupational History   Not on file  Tobacco Use   Smoking status: Former    Types: Cigarettes    Quit date: 11/04/1985    Years since quitting: 36.5   Smokeless tobacco: Never  Substance and Sexual Activity   Alcohol use: Yes    Alcohol/week: 2.0 - 3.0 standard drinks of alcohol    Types: 2 - 3 Cans of beer per week   Drug use: No   Sexual activity: Not on file   Other Topics Concern   Not on file  Social History Narrative   Not on file   Social Determinants of Health   Financial Resource Strain: Low Risk  (07/10/2021)   Overall Financial Resource Strain (CARDIA)    Difficulty of Paying Living Expenses: Not hard at all  Food Insecurity: No Food Insecurity (07/10/2021)   Hunger Vital Sign    Worried About Running Out of Food in the Last Year: Never true    Ran Out of Food in the Last Year: Never true  Transportation Needs: No Transportation Needs (07/10/2021)   PRAPARE - Administrator, Civil Service (Medical): No    Lack of Transportation (Non-Medical): No  Physical Activity: Sufficiently Active (07/10/2021)   Exercise Vital Sign    Days of Exercise per Week: 5 days    Minutes of Exercise per Session: 30 min  Stress: No Stress Concern Present (07/10/2021)   [employer] of Occupational Health - Occupational Stress Questionnaire    Feeling of Stress : Not at all  Social Connections: Socially Integrated (07/10/2021)   Social Connection and Isolation Panel [NHANES]    Frequency of Communication with Friends and Family: More than three times a week    Frequency of Social Gatherings with Friends and Family: More than three times a week    Attends Religious Services: More than 4 times per year    Active Member of [employer] or Organizations: Yes    Attends Banker Meetings: More than 4 times per year    Marital Status: Married  Catering manager Violence: Not At Risk (07/10/2021)   Humiliation, Afraid, Rape, and Kick questionnaire    Fear of Current or Ex-Partner: No    Emotionally Abused: No    Physically Abused: No    Sexually Abused: No     Review of Systems  All other systems reviewed and are negative.      Objective:   Physical Exam Vitals reviewed.  Constitutional:      Appearance: He is well-developed.  Neck:     Thyroid: No thyromegaly.     Vascular: No JVD.  Cardiovascular:     Rate and Rhythm:  Normal rate and regular rhythm.     Heart sounds: Normal heart sounds. No murmur heard. Pulmonary:     Effort: Pulmonary effort is normal. No respiratory distress.     Breath sounds: Normal breath sounds. No wheezing or rales.  Chest:     Chest wall: No tenderness.  Abdominal:     General: Bowel sounds are  normal. There is no distension.     Palpations: Abdomen is soft. There is no mass.     Tenderness: There is no abdominal tenderness. There is no guarding or rebound.  Musculoskeletal:     Cervical back: Neck supple.  Lymphadenopathy:     Cervical: No cervical adenopathy.  Skin:    Findings: No rash.    Lab on 05/14/2022  Component Date Value Ref Range Status   WBC 05/14/2022 6.4  3.8 - 10.8 Thousand/uL Final   RBC 05/14/2022 3.53 (L)  4.20 - 5.80 Million/uL Final   Hemoglobin 05/14/2022 11.2 (L)  13.2 - 17.1 g/dL Final   HCT 16/10/9602 34.8 (L)  38.5 - 50.0 % Final   MCV 05/14/2022 98.6  80.0 - 100.0 fL Final   MCH 05/14/2022 31.7  27.0 - 33.0 pg Final   MCHC 05/14/2022 32.2  32.0 - 36.0 g/dL Final   RDW 54/09/8117 14.5  11.0 - 15.0 % Final   Platelets 05/14/2022 137 (L)  140 - 400 Thousand/uL Final   MPV 05/14/2022 12.4  7.5 - 12.5 fL Final   Neutro Abs 05/14/2022 3,853  1,500 - 7,800 cells/uL Final   Lymphs Abs 05/14/2022 1,594  850 - 3,900 cells/uL Final   Absolute Monocytes 05/14/2022 602  200 - 950 cells/uL Final   Eosinophils Absolute 05/14/2022 320  15 - 500 cells/uL Final   Basophils Absolute 05/14/2022 32  0 - 200 cells/uL Final   Neutrophils Relative % 05/14/2022 60.2  % Final   Total Lymphocyte 05/14/2022 24.9  % Final   Monocytes Relative 05/14/2022 9.4  % Final   Eosinophils Relative 05/14/2022 5.0  % Final   Basophils Relative 05/14/2022 0.5  % Final   Glucose, Bld 05/14/2022 112 (H)  65 - 99 mg/dL Final   Comment: .            Fasting reference interval . For someone without known diabetes, a glucose value between 100 and 125 mg/dL is consistent  with prediabetes and should be confirmed with a follow-up test. .    BUN 05/14/2022 20  7 - 25 mg/dL Final   Creat 14/78/2956 1.23 (H)  0.70 - 1.22 mg/dL Final   eGFR 21/30/8657 59 (L)  > OR = 60 mL/min/1.71m2 Final   BUN/Creatinine Ratio 05/14/2022 16  6 - 22 (calc) Final   Sodium 05/14/2022 143  135 - 146 mmol/L Final   Potassium 05/14/2022 4.1  3.5 - 5.3 mmol/L Final   Chloride 05/14/2022 108  98 - 110 mmol/L Final   CO2 05/14/2022 28  20 - 32 mmol/L Final   Calcium 05/14/2022 9.1  8.6 - 10.3 mg/dL Final   Total Protein 84/69/6295 5.9 (L)  6.1 - 8.1 g/dL Final   Albumin 28/41/3244 4.1  3.6 - 5.1 g/dL Final   Globulin 01/13/7251 1.8 (L)  1.9 - 3.7 g/dL (calc) Final   AG Ratio 05/14/2022 2.3  1.0 - 2.5 (calc) Final   Total Bilirubin 05/14/2022 1.2  0.2 - 1.2 mg/dL Final   Alkaline phosphatase (APISO) 05/14/2022 64  35 - 144 U/L Final   AST 05/14/2022 13  10 - 35 U/L Final   ALT 05/14/2022 9  9 - 46 U/L Final   Hgb A1c MFr Bld 05/14/2022 5.6  <5.7 % of total Hgb Final   Comment: For the purpose of screening for the presence of diabetes: . <5.7%       Consistent with the absence of diabetes 5.7-6.4%    Consistent  with increased risk for diabetes             (prediabetes) > or =6.5%  Consistent with diabetes . This assay result is consistent with a decreased risk of diabetes. . Currently, no consensus exists regarding use of hemoglobin A1c for diagnosis of diabetes in children. . According to American Diabetes Association (ADA) guidelines, hemoglobin A1c <7.0% represents optimal control in non-pregnant diabetic patients. Different metrics may apply to specific patient populations.  Standards of Medical Care in Diabetes(ADA). .    Mean Plasma Glucose 05/14/2022 114  mg/dL Final   eAG (mmol/L) 30/86/5784 6.3  mmol/L Final   Comment: . This test was performed on the Roche cobas c503 platform. Effective 10/19/21, a change in test platforms from the Abbott Architect to the  Roche cobas c503 may have shifted HbA1c results compared to historical results. Based on laboratory validation testing conducted at [facility], the Roche platform relative to the Abbott platform had an average increase in HbA1c value of < or = 0.3%. This difference is within accepted  variability established by the [facility]. Note that not all individuals will have had a shift in their results and direct comparisons between historical and current results for testing conducted on different platforms is not recommended.    Cholesterol 05/14/2022 115  <200 mg/dL Final   HDL 69/62/9528 57  > OR = 40 mg/dL Final   Triglycerides 41/32/4401 42  <150 mg/dL Final   LDL Cholesterol (Calc) 05/14/2022 46  mg/dL (calc) Final   Comment: Reference range: <100 . Desirable range <100 mg/dL for primary prevention;   <70 mg/dL for patients with CHD or diabetic patients  with > or = 2 CHD risk factors. Marland Kitchen LDL-C is now calculated using the Martin-Hopkins  calculation, which is a validated novel method providing  better accuracy than the Friedewald equation in the  estimation of LDL-C.  Horald Pollen et al. Lenox Ahr. 0272;536(64): 2061-2068  (http://education.[facility].com/faq/FAQ164)    Total CHOL/HDL Ratio 05/14/2022 2.0  <4.0 (calc) Final   Non-HDL Cholesterol (Calc) 05/14/2022 58  <130 mg/dL (calc) Final   Comment: For patients with diabetes plus 1 major ASCVD risk  factor, treating to a non-HDL-C goal of <100 mg/dL  (LDL-C of <34 mg/dL) is considered a therapeutic  option.           Assessment & Plan:  Type 2 diabetes mellitus without complication, without long-term current use of insulin (HCC)  Benign essential HTN  History of ASCVD Very happy with the patient's blood pressure and his lab work.  I did encourage him to try to drink more water describing experiencing significant difficulty in chicken.  Otherwise his lab work looks excellent.  Regular  anticipatory guidance is provided.  Recheck in 6 months

## 2022-05-19 ENCOUNTER — Other Ambulatory Visit: Payer: Self-pay | Admitting: Family Medicine

## 2022-05-19 DIAGNOSIS — I10 Essential (primary) hypertension: Secondary | ICD-10-CM

## 2022-05-19 NOTE — Telephone Encounter (Signed)
Dc'd 08/24/21 Dr. Tanya Nones  Requested Prescriptions  Refused Prescriptions Disp Refills   amLODipine (NORVASC) 5 MG tablet [Pharmacy Med Name: AMLODIPINE BESYLATE 5 MG TAB] 180 tablet 3    Sig: TAKE TWO TABLETS BY MOUTH DAILY     Cardiovascular: Calcium Channel Blockers 2 Failed - 05/19/2022  6:11 AM      Failed - Valid encounter within last 6 months    Recent Outpatient Visits           1 year ago Right carotid bruit   [facility] Pickard, Priscille Heidelberg, MD   1 year ago Dehydration   [facility] Pickard, Priscille Heidelberg, MD   1 year ago Dysuria   [facility] [name], MD   1 year ago Type 2 diabetes mellitus without complication, without long-term current use of insulin (HCC)   [facility] Pickard, Priscille Heidelberg, MD   1 year ago Benign essential HTN   [facility] Pickard, Priscille Heidelberg, MD       Future Appointments             In 6 months Pickard, Priscille Heidelberg, MD [facility]            Passed - Last BP in normal range    BP Readings from Last 1 Encounters:  05/17/22 (!) 122/58         Passed - Last Heart Rate in normal range    Pulse Readings from Last 1 Encounters:  05/17/22 74

## 2022-05-30 ENCOUNTER — Other Ambulatory Visit: Payer: Self-pay | Admitting: Family Medicine

## 2022-05-31 NOTE — Telephone Encounter (Signed)
OV 05/17/22 Requested Prescriptions  Pending Prescriptions Disp Refills   mirtazapine (REMERON) 30 MG tablet [Pharmacy Med Name: MIRTAZAPINE 30 MG TABLET] 30 tablet 2    Sig: TAKE 1 TABLET BY MOUTH EVERY NIGHT AT BEDTIME FOR APPETITE     Psychiatry: Antidepressants - mirtazapine Failed - 05/30/2022  6:40 AM      Failed - Valid encounter within last 6 months    Recent Outpatient Visits           1 year ago Right carotid bruit   [facility] Tanya Nones, Priscille Heidelberg, MD   1 year ago Dehydration   [facility] Pickard, Priscille Heidelberg, MD   1 year ago Dysuria   [facility] [name], MD   1 year ago Type 2 diabetes mellitus without complication, without long-term current use of insulin (HCC)   [facility] [name], MD   2 years ago Benign essential HTN   [facility] Family Medicine Pickard, Priscille Heidelberg, MD       Future Appointments             In 5 months Pickard, Priscille Heidelberg, MD [facility], [facility]

## 2022-06-05 ENCOUNTER — Other Ambulatory Visit: Payer: Self-pay | Admitting: Family Medicine

## 2022-06-05 DIAGNOSIS — E782 Mixed hyperlipidemia: Secondary | ICD-10-CM

## 2022-06-08 NOTE — Telephone Encounter (Signed)
Requested Prescriptions  Pending Prescriptions Disp Refills   benazepril (LOTENSIN) 40 MG tablet [Pharmacy Med Name: BENAZEPRIL HCL 40 MG TABLET] 90 tablet 0    Sig: TAKE 1 TABLET BY MOUTH DAILY     Cardiovascular:  ACE Inhibitors Failed - 06/05/2022  6:40 AM      Failed - Cr in normal range and within 180 days    Creat  Date Value Ref Range Status  05/14/2022 1.23 (H) 0.70 - 1.22 mg/dL Final   Creatinine, Urine  Date Value Ref Range Status  11/13/2021 94 20 - 320 mg/dL Final         Failed - Valid encounter within last 6 months    Recent Outpatient Visits           1 year ago Right carotid bruit   [facility] Pickard, Priscille Heidelberg, MD   1 year ago Dehydration   [facility] Pickard, Priscille Heidelberg, MD   1 year ago Dysuria   [facility] Medicine [name], MD   1 year ago Type 2 diabetes mellitus without complication, without long-term current use of insulin (HCC)   [facility] Tanya Nones, Priscille Heidelberg, MD   2 years ago Benign essential HTN   [facility] Medicine [name], MD       Future Appointments             In 5 months Pickard, Priscille Heidelberg, MD [facility]            Passed - K in normal range and within 180 days    Potassium  Date Value Ref Range Status  05/14/2022 4.1 3.5 - 5.3 mmol/L Final         Passed - Patient is not pregnant      Passed - Last BP in normal range    BP Readings from Last 1 Encounters:  05/17/22 (!) 122/58

## 2022-07-21 NOTE — Progress Notes (Signed)
Subjective:   Donald Rios is a 82 y.o. male who presents for Medicare Annual/Subsequent preventive examination.  Visit Complete: Virtual  I connected with  [name] on 07/22/22 by a audio enabled telemedicine application and verified that I am speaking with the correct person using two identifiers.  Patient Location: Home  Provider Location: Home Office  I discussed the limitations of evaluation and management by telemedicine. The patient expressed understanding and agreed to proceed.  Review of Systems     Cardiac Risk Factors include: advanced age (>38men, >45 women);diabetes mellitus;dyslipidemia;hypertension;male gender     Objective:    Today's Vitals   07/22/22 1229  Weight: 153 lb (69.4 kg)  Height: 5\' 8"  (1.727 m)   Body mass index is 23.26 kg/m.     07/22/2022    1:38 PM 07/10/2021   10:33 AM 06/26/2020    3:05 PM 07/11/2013    9:00 AM 05/07/2013    1:22 AM  Advanced Directives  Does Patient Have a Medical Advance Directive? No Yes Yes Patient has advance directive, copy not in chart Patient has advance directive, copy not in chart  Type of Advance Directive  Healthcare Power of [place];Living will Healthcare Power of [place];Living will Living will Healthcare Power of [place];Living will  Does patient want to make changes to medical advance directive?   No - Patient declined    Copy of Healthcare Power of Attorney in Chart?  No - copy requested Yes - validated most recent copy scanned in chart (See row information)  Copy requested from family  Would patient like information on creating a medical advance directive? Yes (MAU/Ambulatory/Procedural Areas - Information given)      Pre-existing out of facility DNR order (yellow form or pink MOST form)     No    Current Medications (verified) Outpatient Encounter Medications as of 07/22/2022  Medication Sig   allopurinol (ZYLOPRIM) 300 MG tablet TAKE 1 TABLET BY MOUTH DAILY   aspirin 81 MG tablet Take 81 mg  by mouth daily.   benazepril (LOTENSIN) 40 MG tablet TAKE 1 TABLET BY MOUTH DAILY   doxazosin (CARDURA) 2 MG tablet TAKE 1 TABLET BY MOUTH DAILY   metoprolol tartrate (LOPRESSOR) 50 MG tablet TAKE 1 AND 1/2 TABLET BY MOUTH TWO TIMES A DAY   mirtazapine (REMERON) 30 MG tablet TAKE 1 TABLET BY MOUTH EVERY NIGHT AT BEDTIME FOR APPETITE   simvastatin (ZOCOR) 40 MG tablet TAKE ONE TABLET BY MOUTH DAILY   No facility-administered encounter medications on file as of 07/22/2022.    Allergies (verified) Patient has no known allergies.   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    History of MI (myocardial infarction)    History of nuclear stress test 08/07/2010   dipyridamole; EKG negative for ischemia, low risk scan    Hyperlipidemia    Hyperplastic colon polyp 05/2008   Hypertension    Ischemic cardiomyopathy    EF  45%, with inferior wall motion abnormality    Myocardial infarction (HCC)    Valvular regurgitation    mitral and tricuspid (mild)   Past Surgical History:  Procedure Laterality Date   CORONARY ARTERY BYPASS GRAFT  2000   LIMA to LAD, free IMA to OM2, sequential graft to PLA & PLD   ESOPHAGOGASTRODUODENOSCOPY N/A 05/07/2013   Procedure: ESOPHAGOGASTRODUODENOSCOPY (EGD);  Surgeon: Iva Boop, MD;  Location: [facility] ENDOSCOPY;  Service: Endoscopy;  Laterality: N/A;   TRANSTHORACIC ECHOCARDIOGRAM  09/07/2010   EF 45-50%; LV systolic function mildly reduced; LA mildly dilated; mild-mod MR & mild-mod TR; aortic root  sclerosis/calcification;    Family History  Problem Relation Age of Onset   Stroke Father    Hypertension Father    Stroke Maternal Grandmother    Stroke Maternal Grandfather    Diabetes Brother    Esophageal cancer Brother    Stomach cancer Maternal Aunt    Colon cancer Neg Hx    Rectal cancer Neg Hx    Social History   Socioeconomic History   Marital status: Married    Spouse name: Not on file   Number of children: 1   Years of education: Not on file   Highest education level: Not on file  Occupational History   Not on file  Tobacco Use   Smoking status: Former    Current packs/day: 0.00    Types: Cigarettes    Quit date: 11/04/1985    Years since quitting: 36.7   Smokeless tobacco: Never  Substance and Sexual Activity   Alcohol use: Yes    Alcohol/week: 2.0 - 3.0 standard drinks of alcohol    Types: 2 - 3 Cans of beer per week   Drug use: No   Sexual activity: Not on file  Other Topics Concern   Not on file  Social History Narrative   Not on file   Social Determinants of Health   Financial Resource Strain: Low Risk  (07/22/2022)   Overall Financial Resource Strain (CARDIA)    Difficulty of Paying Living Expenses: Not hard at all  Food Insecurity: No Food Insecurity (07/22/2022)   Hunger Vital Sign    Worried About Running Out of Food in the Last Year: Never true    Ran Out of Food in the Last Year: Never true  Transportation Needs: No Transportation Needs (07/22/2022)   PRAPARE - Administrator, Civil Service (Medical): No    Lack of Transportation (Non-Medical): No  Physical Activity: Sufficiently Active (07/10/2021)   Exercise Vital Sign    Days of Exercise per Week: 5 days    Minutes of Exercise per Session: 30 min  Stress: No Stress Concern Present (07/22/2022)   [employer] of Occupational Health - Occupational Stress Questionnaire    Feeling of Stress : Not at all  Social Connections: Socially Integrated (07/22/2022)   Social  Connection and Isolation Panel [NHANES]    Frequency of Communication with Friends and Family: More than three times a week    Frequency of Social Gatherings with Friends and Family: More than three times a week    Attends Religious Services: More than 4 times per year    Active Member of [employer] or Organizations: Yes    Attends Engineer, structural: More than 4 times per year    Marital Status: Married    Tobacco Counseling Counseling given: Not Answered   Clinical Intake:  Pre-visit preparation completed: Yes  Pain : No/denies pain  Diabetes: Yes CBG done?: No Did pt. bring in CBG monitor from home?: No  How often do you need to have someone help you when you read instructions, pamphlets, or other written materials from your doctor or pharmacy?: 1 - Never  Interpreter Needed?: No  Information entered by :: Kandis Fantasia LPN   Activities of Daily Living    07/22/2022    1:34 PM  In your present state of health, do you have any difficulty performing the following activities:  Hearing? 0  Vision? 0  Difficulty concentrating or making decisions? 0  Walking or climbing stairs? 0  Dressing or bathing? 0  Doing errands, shopping? 0  Preparing Food and eating ? N  Using the Toilet? N  In the past six months, have you accidently leaked urine? N  Do you have problems with loss of bowel control? N  Managing your Medications? N  Managing your Finances? N  Housekeeping or managing your Housekeeping? N    Patient Care Team: [name], MD as PCP - General (Family Medicine) Rennis Golden Lisette Abu, MD as PCP - Cardiology (Cardiology) Erroll Luna, [facility] (Inactive) as Pharmacist (Pharmacist) Antony Contras, MD as Consulting Physician (Ophthalmology)  Indicate any recent Medical Services you may have received from other than [facility] providers in the past year (date may be approximate).     Assessment:   This is a routine wellness examination for  Deacon.  Hearing/Vision screen Hearing Screening - Comments:: Hard of hearing  Vision Screening - Comments::  up to date with routine eye exams with Dr. Randon Goldsmith    Dietary issues and exercise activities discussed:     Goals Addressed             This Visit's Progress    COMPLETED: Pharmacy Care Plan:       CARE PLAN ENTRY (see longitudinal plan of care for additional care plan information)  Current Barriers:  Chronic Disease Management support, education, and care coordination needs related to Hypertension, Hyperlipidemia, Diabetes, and Heart Failure   Hypertension BP Readings from Last 3 Encounters:  10/25/19 (!) 150/82  09/11/19 130/78  12/19/18 118/62   Pharmacist Clinical Goal(s): Over the next 180 days, patient will work with PharmD and providers to maintain BP goal <130/80 Current regimen:  Benazepril 40mg  daily Clonidine 0.2mg  twice daily Metoprolol tartrate 50mg  one and one-half tablet po bid HCTZ 25mg  Interventions: Recommended continued home monitoring Comprehensive medication review Patient self care activities - Over the next 180 days, patient will: Check BP daily, document, and provide at future appointments Ensure daily salt intake < 2300 mg/day  Hyperlipidemia Lab Results  Component Value Date/Time   LDLCALC 44 09/13/2019 09:07 AM   LDLCALC 58 08/17/2012 08:13 AM   Pharmacist Clinical Goal(s): Over the next 180 days, patient will work with PharmD and providers to maintain LDL goal < 70 Current regimen:  Simvastatin 40mg  Interventions: Reviewed most recent lipid panel Patient self care activities - Over the next 180 days, patient will: Continue to focus on medication adherence by pill count  Diabetes Lab Results  Component Value Date/Time   HGBA1C 5.6 09/13/2019 09:07 AM   HGBA1C 5.8 (H) 10/13/2018 08:41 AM   Pharmacist Clinical Goal(s): Over the next 180 days, patient will work with PharmD and providers to maintain A1c goal <6.5% Current  regimen:  No medications Interventions: Discussed limiting carbohydrates and sweets from diet Counseled on s/sx of hyperglycemia Patient self care activities - Over the next 180 days, patient will: Check  blood sugar as needed, document, and provide at future appointments Contact provider with any episodes of hypoglycemia  HF Pharmacist Clinical Goal(s) Over the next 180 days, patient will work with PharmD and providers to optimize medication and minimize symptoms of HF. Current regimen:  Metoprolol tartrate 50mg  one-half tablet by mouth twice daily Interventions: Discussed swelling in lower extremities Comprehensive medication review Mentioned Dr. Caren Macadam recommendation of Lasix instead of HCTZ Patient self care activities - Over the next 180 days, patient will: Continue medication as directed Follow up with PCP next week to discuss medication changes  Initial goal documentation      Remain active and independent         Depression Screen    07/22/2022    1:37 PM 05/17/2022    2:01 PM 11/16/2021    7:58 AM 07/10/2021   10:30 AM 06/26/2020    3:07 PM 10/29/2019   11:06 AM 10/17/2017    7:57 AM  PHQ 2/9 Scores  PHQ - 2 Score 0 0 3 0 0 0 0  PHQ- 9 Score   4        Fall Risk    07/22/2022    1:38 PM 05/17/2022    2:01 PM 11/16/2021    7:58 AM 07/10/2021   10:33 AM 06/26/2020    3:06 PM  Fall Risk   Falls in the past year? 0 0 0 0 0  Number falls in past yr: 0 0 0 0 0  Injury with Fall? 0 0 0 0 0  Risk for fall due to : No Fall Risks No Fall Risks No Fall Risks No Fall Risks No Fall Risks  Follow up Falls prevention discussed;Education provided;Falls evaluation completed Falls prevention discussed Falls prevention discussed Falls prevention discussed Falls evaluation completed;Falls prevention discussed    MEDICARE RISK AT HOME:  Medicare Risk at Home - 07/22/22 1338     Any stairs in or around the home? No    If so, are there any without handrails? No    Home free of  loose throw rugs in walkways, pet beds, electrical cords, etc? Yes    Adequate lighting in your home to reduce risk of falls? Yes    Life alert? No    Use of a cane, walker or w/c? No    Grab bars in the bathroom? Yes    Shower chair or bench in shower? No    Elevated toilet seat or a handicapped toilet? No             TIMED UP AND GO:  Was the test performed?  No    Cognitive Function:        07/22/2022    1:39 PM 07/10/2021   10:36 AM  6CIT Screen  What Year? 0 points 0 points  What month? 0 points 0 points  What time? 0 points 0 points  Count back from 20 0 points 0 points  Months in reverse 0 points 0 points  Repeat phrase 0 points 0 points  Total Score 0 points 0 points    Immunizations Immunization History  Administered Date(s) Administered   COVID-19, mRNA, vaccine(Comirnaty)12 years and older 12/31/2021   Fluad Quad(high Dose 65+) 10/17/2018, 10/29/2019, 12/16/2020, 11/16/2021   Influenza Whole 11/22/2002, 11/07/2003, 10/06/2009   Influenza, High Dose Seasonal PF 10/17/2017   Influenza,inj,Quad PF,6+ Mos 11/02/2012, 10/01/2013, 10/04/2014, 10/03/2015, 10/11/2016   PFIZER(Purple Top)SARS-COV-2 Vaccination 02/20/2019, 03/17/2019, 11/10/2019   Pfizer Covid-19 Vaccine Bivalent Booster 14yrs & up 11/06/2020  Pneumococcal Conjugate-13 01/26/2013   Pneumococcal Polysaccharide-23 10/27/1998, 03/31/2015   Td 03/27/2009   Tdap 05/24/2011   Zoster, Live 01/03/2012    TDAP status: Up to date  Pneumococcal vaccine status: Up to date  Covid-19 vaccine status: Information provided on how to obtain vaccines.   Qualifies for Shingles Vaccine? Yes   Zostavax completed Yes   Shingrix Completed?: No.    Education has been provided regarding the importance of this vaccine. Patient has been advised to call insurance company to determine out of pocket expense if they have not yet received this vaccine. Advised may also receive vaccine at local pharmacy or Health Dept.  Verbalized acceptance and understanding.  Screening Tests Health Maintenance  Topic Date Due   COVID-19 Vaccine (6 - 2023-24 season) 08/07/2022 (Originally 05/02/2022)   Zoster Vaccines- Shingrix (1 of 2) 08/17/2022 (Originally 12/15/1991)   INFLUENZA VACCINE  08/12/2022   Diabetic kidney evaluation - Urine ACR  11/14/2022   HEMOGLOBIN A1C  11/14/2022   FOOT EXAM  11/17/2022   OPHTHALMOLOGY EXAM  03/11/2023   Diabetic kidney evaluation - eGFR measurement  05/14/2023   Medicare Annual Wellness (AWV)  07/22/2023   Pneumonia Vaccine 33+ Years old  Completed   HPV VACCINES  Aged Out   DTaP/Tdap/Td  Discontinued   Hepatitis C Screening  Discontinued    Health Maintenance  There are no preventive care reminders to display for this patient.   Colorectal cancer screening: No longer required.   Lung Cancer Screening: (Low Dose CT Chest recommended if Age 69-80 years, 20 pack-year currently smoking OR have quit w/in 15years.) does not qualify.   Lung Cancer Screening Referral: n/a  Additional Screening:  Hepatitis C Screening: does qualify; Completed 09/05/20  Vision Screening: Recommended annual ophthalmology exams for early detection of glaucoma and other disorders of the eye. Is the patient up to date with their annual eye exam?  Yes  Who is the provider or what is the name of the office in which the patient attends annual eye exams? Dr. Randon Goldsmith  If pt is not established with a provider, would they like to be referred to a provider to establish care? No .   Dental Screening: Recommended annual dental exams for proper oral hygiene  Diabetic Foot Exam: Diabetic Foot Exam: Completed 11/16/21  Community Resource Referral / Chronic Care Management: CRR required this visit?  No   CCM required this visit?  No     Plan:     I have personally reviewed and noted the following in the patient's chart:   Medical and social history Use of alcohol, tobacco or illicit drugs  Current  medications and supplements including opioid prescriptions. Patient is not currently taking opioid prescriptions. Functional ability and status Nutritional status Physical activity Advanced directives List of other physicians Hospitalizations, surgeries, and ER visits in previous 12 months Vitals Screenings to include cognitive, depression, and falls Referrals and appointments  In addition, I have reviewed and discussed with patient certain preventive protocols, quality metrics, and best practice recommendations. A written personalized care plan for preventive services as well as general preventive health recommendations were provided to patient.     Kandis Fantasia [place], [place]   02/24/[birth year]   After Visit Summary: (MyChart) Due to this being a telephonic visit, the after visit summary with patients personalized plan was offered to patient via MyChart   Nurse Notes: No concerns

## 2022-07-21 NOTE — Patient Instructions (Incomplete)
Donald Rios , Thank you for taking time to come for your Medicare Wellness Visit. I appreciate your ongoing commitment to your health goals. Please review the following plan we discussed and let me know if I can assist you in the future.   These are the goals we discussed:  Goals      Blood Pressure < 140/90     Prevent falls     Remain active and independent        This is a list of the screening recommended for you and due dates:  Health Maintenance  Topic Date Due   COVID-19 Vaccine (6 - 2023-24 season) 08/07/2022*   Zoster (Shingles) Vaccine (1 of 2) 08/17/2022*   Flu Shot  08/12/2022   Yearly kidney health urinalysis for diabetes  11/14/2022   Hemoglobin A1C  11/14/2022   Complete foot exam   11/17/2022   Eye exam for diabetics  03/11/2023   Yearly kidney function blood test for diabetes  05/14/2023   Medicare Annual Wellness Visit  07/22/2023   Pneumonia Vaccine  Completed   HPV Vaccine  Aged Out   DTaP/Tdap/Td vaccine  Discontinued   Hepatitis C Screening  Discontinued  *Topic was postponed. The date shown is not the original due date.    Advanced directives: Information on Advanced Care Planning can be found at [facility] Advance Health Care Directives Advance Health Care Directives ([email or web address] Please bring a copy of your health care power of attorney and living will to the office to be added to your chart at your convenience.  Conditions/risks identified: Aim for 30 minutes of exercise or brisk walking, 6-8 glasses of water, and 5 servings of fruits and vegetables each day.  Next appointment: Follow up in one year for your annual wellness visit.   Preventive Care 20 Years and Older, Male  Preventive care refers to lifestyle choices and visits with your health care provider that can promote health and wellness. What does preventive care include? A yearly physical exam. This is also called an annual well check. Dental exams once or twice a  year. Routine eye exams. Ask your health care provider how often you should have your eyes checked. Personal lifestyle choices, including: Daily care of your teeth and gums. Regular physical activity. Eating a healthy diet. Avoiding tobacco and drug use. Limiting alcohol use. Practicing safe sex. Taking low doses of aspirin every day. Taking vitamin and mineral supplements as recommended by your health care provider. What happens during an annual well check? The services and screenings done by your health care provider during your annual well check will depend on your age, overall health, lifestyle risk factors, and family history of disease. Counseling  Your health care provider may ask you questions about your: Alcohol use. Tobacco use. Drug use. Emotional well-being. Home and relationship well-being. Sexual activity. Eating habits. History of falls. Memory and ability to understand (cognition). Work and work Astronomer. Screening  You may have the following tests or measurements: Height, weight, and BMI. Blood pressure. Lipid and cholesterol levels. These may be checked every 5 years, or more frequently if you are over 57 years old. Skin check. Lung cancer screening. You may have this screening every year starting at age 36 if you have a 30-pack-year history of smoking and currently smoke or have quit within the past 15 years. Fecal occult blood test (FOBT) of the stool. You may have this test every year starting at age 62. Flexible sigmoidoscopy or colonoscopy.  You may have a sigmoidoscopy every 5 years or a colonoscopy every 10 years starting at age 46. Prostate cancer screening. Recommendations will vary depending on your family history and other risks. Hepatitis C blood test. Hepatitis B blood test. Sexually transmitted disease (STD) testing. Diabetes screening. This is done by checking your blood sugar (glucose) after you have not eaten for a while (fasting). You may  have this done every 1-3 years. Abdominal aortic aneurysm (AAA) screening. You may need this if you are a current or former smoker. Osteoporosis. You may be screened starting at age 17 if you are at high risk. Talk with your health care provider about your test results, treatment options, and if necessary, the need for more tests. Vaccines  Your health care provider may recommend certain vaccines, such as: Influenza vaccine. This is recommended every year. Tetanus, diphtheria, and acellular pertussis (Tdap, Td) vaccine. You may need a Td booster every 10 years. Zoster vaccine. You may need this after age 41. Pneumococcal 13-valent conjugate (PCV13) vaccine. One dose is recommended after age 67. Pneumococcal polysaccharide (PPSV23) vaccine. One dose is recommended after age 10. Talk to your health care provider about which screenings and vaccines you need and how often you need them. This information is not intended to replace advice given to you by your health care provider. Make sure you discuss any questions you have with your health care provider. Document Released: 01/24/2015 Document Revised: 09/17/2015 Document Reviewed: 10/29/2014 Elsevier Interactive Patient Education  2017 [employer].  Fall Prevention in the Home Falls can cause injuries. They can happen to people of all ages. There are many things you can do to make your home safe and to help prevent falls. What can I do on the outside of my home? Regularly fix the edges of walkways and driveways and fix any cracks. Remove anything that might make you trip as you walk through a door, such as a raised step or threshold. Trim any bushes or trees on the path to your home. Use bright outdoor lighting. Clear any walking paths of anything that might make someone trip, such as rocks or tools. Regularly check to see if handrails are loose or broken. Make sure that both sides of any steps have handrails. Any raised decks and porches  should have guardrails on the edges. Have any leaves, snow, or ice cleared regularly. Use sand or salt on walking paths during winter. Clean up any spills in your garage right away. This includes oil or grease spills. What can I do in the bathroom? Use night lights. Install grab bars by the toilet and in the tub and shower. Do not use towel bars as grab bars. Use non-skid mats or decals in the tub or shower. If you need to sit down in the shower, use a plastic, non-slip stool. Keep the floor dry. Clean up any water that spills on the floor as soon as it happens. Remove soap buildup in the tub or shower regularly. Attach bath mats securely with double-sided non-slip rug tape. Do not have throw rugs and other things on the floor that can make you trip. What can I do in the bedroom? Use night lights. Make sure that you have a light by your bed that is easy to reach. Do not use any sheets or blankets that are too big for your bed. They should not hang down onto the floor. Have a firm chair that has side arms. You can use this for support while you  get dressed. Do not have throw rugs and other things on the floor that can make you trip. What can I do in the kitchen? Clean up any spills right away. Avoid walking on wet floors. Keep items that you use a lot in easy-to-reach places. If you need to reach something above you, use a strong step stool that has a grab bar. Keep electrical cords out of the way. Do not use floor polish or wax that makes floors slippery. If you must use wax, use non-skid floor wax. Do not have throw rugs and other things on the floor that can make you trip. What can I do with my stairs? Do not leave any items on the stairs. Make sure that there are handrails on both sides of the stairs and use them. Fix handrails that are broken or loose. Make sure that handrails are as long as the stairways. Check any carpeting to make sure that it is firmly attached to the stairs.  Fix any carpet that is loose or worn. Avoid having throw rugs at the top or bottom of the stairs. If you do have throw rugs, attach them to the floor with carpet tape. Make sure that you have a light switch at the top of the stairs and the bottom of the stairs. If you do not have them, ask someone to add them for you. What else can I do to help prevent falls? Wear shoes that: Do not have high heels. Have rubber bottoms. Are comfortable and fit you well. Are closed at the toe. Do not wear sandals. If you use a stepladder: Make sure that it is fully opened. Do not climb a closed stepladder. Make sure that both sides of the stepladder are locked into place. Ask someone to hold it for you, if possible. Clearly mark and make sure that you can see: Any grab bars or handrails. First and last steps. Where the edge of each step is. Use tools that help you move around (mobility aids) if they are needed. These include: Canes. Walkers. Scooters. Crutches. Turn on the lights when you go into a dark area. Replace any light bulbs as soon as they burn out. Set up your furniture so you have a clear path. Avoid moving your furniture around. If any of your floors are uneven, fix them. If there are any pets around you, be aware of where they are. Review your medicines with your doctor. Some medicines can make you feel dizzy. This can increase your chance of falling. Ask your doctor what other things that you can do to help prevent falls. This information is not intended to replace advice given to you by your health care provider. Make sure you discuss any questions you have with your health care provider. Document Released: 10/24/2008 Document Revised: 06/05/2015 Document Reviewed: 02/01/2014 Elsevier Interactive Patient Education  2017 [employer].

## 2022-07-22 ENCOUNTER — Ambulatory Visit (INDEPENDENT_AMBULATORY_CARE_PROVIDER_SITE_OTHER): Payer: Medicare Other

## 2022-07-22 VITALS — Ht 68.0 in | Wt 153.0 lb

## 2022-07-22 DIAGNOSIS — Z0000 Encounter for general adult medical examination without abnormal findings: Secondary | ICD-10-CM

## 2022-07-30 ENCOUNTER — Encounter: Payer: Self-pay | Admitting: Family Medicine

## 2022-07-30 ENCOUNTER — Ambulatory Visit (INDEPENDENT_AMBULATORY_CARE_PROVIDER_SITE_OTHER): Payer: Medicare Other | Admitting: Family Medicine

## 2022-07-30 VITALS — BP 132/82 | HR 45 | Temp 98.4°F | Ht 68.0 in | Wt 152.6 lb

## 2022-07-30 DIAGNOSIS — Z85820 Personal history of malignant melanoma of skin: Secondary | ICD-10-CM | POA: Diagnosis not present

## 2022-07-30 DIAGNOSIS — R6881 Early satiety: Secondary | ICD-10-CM

## 2022-07-30 DIAGNOSIS — L821 Other seborrheic keratosis: Secondary | ICD-10-CM | POA: Diagnosis not present

## 2022-07-30 DIAGNOSIS — L905 Scar conditions and fibrosis of skin: Secondary | ICD-10-CM | POA: Diagnosis not present

## 2022-07-30 DIAGNOSIS — Z872 Personal history of diseases of the skin and subcutaneous tissue: Secondary | ICD-10-CM | POA: Diagnosis not present

## 2022-07-30 DIAGNOSIS — Z08 Encounter for follow-up examination after completed treatment for malignant neoplasm: Secondary | ICD-10-CM | POA: Diagnosis not present

## 2022-07-30 DIAGNOSIS — K250 Acute gastric ulcer with hemorrhage: Secondary | ICD-10-CM | POA: Diagnosis not present

## 2022-07-30 DIAGNOSIS — R1084 Generalized abdominal pain: Secondary | ICD-10-CM

## 2022-07-30 DIAGNOSIS — R634 Abnormal weight loss: Secondary | ICD-10-CM

## 2022-07-30 DIAGNOSIS — D485 Neoplasm of uncertain behavior of skin: Secondary | ICD-10-CM | POA: Diagnosis not present

## 2022-07-30 DIAGNOSIS — D225 Melanocytic nevi of trunk: Secondary | ICD-10-CM | POA: Diagnosis not present

## 2022-07-30 DIAGNOSIS — Z09 Encounter for follow-up examination after completed treatment for conditions other than malignant neoplasm: Secondary | ICD-10-CM | POA: Diagnosis not present

## 2022-07-30 DIAGNOSIS — L814 Other melanin hyperpigmentation: Secondary | ICD-10-CM | POA: Diagnosis not present

## 2022-07-30 DIAGNOSIS — D649 Anemia, unspecified: Secondary | ICD-10-CM | POA: Diagnosis not present

## 2022-07-30 LAB — CBC WITH DIFFERENTIAL/PLATELET
Eosinophils Absolute: 99 cells/uL (ref 15–500)
Eosinophils Relative: 1.3 %
HCT: 30.1 % — ABNORMAL LOW (ref 38.5–50.0)
Neutro Abs: 5404 cells/uL (ref 1500–7800)

## 2022-07-30 MED ORDER — PANTOPRAZOLE SODIUM 40 MG PO TBEC
40.0000 mg | DELAYED_RELEASE_TABLET | Freq: Every day | ORAL | 3 refills | Status: DC
Start: 2022-07-30 — End: 2023-03-04

## 2022-07-30 NOTE — Progress Notes (Signed)
Wt Readings from Last 3 Encounters:  07/30/22 152 lb 9.6 oz (69.2 kg)  07/22/22 153 lb (69.4 kg)  05/17/22 153 lb 9.6 oz (69.7 kg)     Subjective:    Patient ID: Donald Rios, male    DOB: 10-May-1940, 82 y.o.   MRN: [id number]  Patient presents today complaining of several months of early satiety.  He states that he just does not have energy.  He has no appetite.  I started him on Remeron at his last visit however his weight has not improved.  He is actually 1 pound less.  He reports vague abdominal discomfort all throughout his abdomen.  He denies any nausea.  He denies any vomiting.  He denies any melena or hematochezia.  He denies any diarrhea.  He denies any fevers or chills.  He denies any dysuria.  He does have a history of peptic ulcer disease.  He is not currently on a proton pump inhibitor. 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    History of MI (myocardial infarction)    History of nuclear stress test 08/07/2010   dipyridamole; EKG negative for ischemia, low risk scan    Hyperlipidemia    Hyperplastic colon polyp 05/2008   Hypertension    Ischemic cardiomyopathy    EF 45%, with inferior wall motion abnormality    Myocardial infarction (HCC)    Valvular regurgitation    mitral and tricuspid (mild)   Past Surgical History:  Procedure Laterality Date   CORONARY ARTERY BYPASS GRAFT  2000   LIMA to LAD, free IMA to OM2, sequential graft to PLA & PLD   ESOPHAGOGASTRODUODENOSCOPY N/A 05/07/2013   Procedure: ESOPHAGOGASTRODUODENOSCOPY (EGD);   Surgeon: Iva Boop, MD;  Location: [facility] ENDOSCOPY;  Service: Endoscopy;  Laterality: N/A;   TRANSTHORACIC ECHOCARDIOGRAM  09/07/2010   EF 45-50%; LV systolic function mildly reduced; LA mildly dilated; mild-mod MR & mild-mod TR; aortic root sclerosis/calcification;    Current Outpatient Medications on File Prior to Visit  Medication Sig Dispense Refill   allopurinol (ZYLOPRIM) 300 MG tablet TAKE 1 TABLET BY MOUTH DAILY 90 tablet 1   aspirin 81 MG tablet Take 81 mg by mouth daily.     benazepril (LOTENSIN) 40 MG tablet TAKE 1 TABLET BY MOUTH DAILY 90 tablet 0   doxazosin (CARDURA) 2 MG tablet TAKE 1 TABLET BY MOUTH DAILY 90 tablet 0   metoprolol tartrate (LOPRESSOR) 50 MG tablet TAKE 1 AND 1/2 TABLET BY MOUTH TWO TIMES A DAY 270 tablet 1   mirtazapine (REMERON) 30 MG tablet TAKE 1 TABLET BY MOUTH EVERY NIGHT AT BEDTIME FOR APPETITE 30 tablet 2   simvastatin (ZOCOR) 40 MG tablet TAKE ONE TABLET BY MOUTH DAILY 90 tablet 3   No current facility-administered medications on file prior to visit.     No Known Allergies Social History   Socioeconomic History   Marital status: Married    Spouse name: Not on file  Number of children: 1   Years of education: Not on file   Highest education level: Not on file  Occupational History   Not on file  Tobacco Use   Smoking status: Former    Current packs/day: 0.00    Types: Cigarettes    Quit date: 11/04/1985    Years since quitting: 36.7   Smokeless tobacco: Never  Substance and Sexual Activity   Alcohol use: Yes    Alcohol/week: 2.0 - 3.0 standard drinks of alcohol    Types: 2 - 3 Cans of beer per week   Drug use: No   Sexual activity: Not on file  Other Topics Concern   Not on file  Social History Narrative   Not on file   Social Determinants of Health   Financial Resource Strain: Low Risk  (07/22/2022)   Overall Financial Resource Strain (CARDIA)    Difficulty of Paying Living Expenses: Not hard at all  Food Insecurity: No  Food Insecurity (07/22/2022)   Hunger Vital Sign    Worried About Running Out of Food in the Last Year: Never true    Ran Out of Food in the Last Year: Never true  Transportation Needs: No Transportation Needs (07/22/2022)   PRAPARE - Administrator, Civil Service (Medical): No    Lack of Transportation (Non-Medical): No  Physical Activity: Sufficiently Active (07/10/2021)   Exercise Vital Sign    Days of Exercise per Week: 5 days    Minutes of Exercise per Session: 30 min  Stress: No Stress Concern Present (07/22/2022)   [employer] of Occupational Health - Occupational Stress Questionnaire    Feeling of Stress : Not at all  Social Connections: Socially Integrated (07/22/2022)   Social Connection and Isolation Panel [NHANES]    Frequency of Communication with Friends and Family: More than three times a week    Frequency of Social Gatherings with Friends and Family: More than three times a week    Attends Religious Services: More than 4 times per year    Active Member of [employer] or Organizations: Yes    Attends Banker Meetings: More than 4 times per year    Marital Status: Married  Catering manager Violence: Not At Risk (07/22/2022)   Humiliation, Afraid, Rape, and Kick questionnaire    Fear of Current or Ex-Partner: No    Emotionally Abused: No    Physically Abused: No    Sexually Abused: No     Review of Systems  All other systems reviewed and are negative.      Objective:   Physical Exam Vitals reviewed.  Constitutional:      Appearance: He is well-developed.  Neck:     Thyroid: No thyromegaly.     Vascular: No JVD.  Cardiovascular:     Rate and Rhythm: Normal rate and regular rhythm.     Heart sounds: Normal heart sounds. No murmur heard. Pulmonary:     Effort: Pulmonary effort is normal. No respiratory distress.     Breath sounds: Normal breath sounds. No wheezing or rales.  Chest:     Chest wall: No tenderness.  Abdominal:      General: Bowel sounds are normal. There is no distension.     Palpations: Abdomen is soft. There is no mass.     Tenderness: There is generalized abdominal tenderness. There is no guarding or rebound.  Musculoskeletal:     Cervical back: Neck supple.  Lymphadenopathy:     Cervical: No  cervical adenopathy.  Skin:    Findings: No rash.            Assessment & Plan:  Generalized abdominal pain - Plan: CBC with Differential/Platelet, COMPLETE METABOLIC PANEL WITH GFR, Lipase  Early satiety  Weight loss, unintentional I am concerned by his early satiety and his subjective weight loss.  He states that he is lost more weight at home.  He states that he feels like he falls more than 10 pounds this year.,  Treat the patient empirically for possible gastritis with pantoprazole 40 mg a day given his history of an ulcer.  Meanwhile I will check a CBC a CMP and a lipase.  I will schedule the patient for CT scan of the abdomen and pelvis to evaluate for any malignancy.

## 2022-07-31 LAB — CBC WITH DIFFERENTIAL/PLATELET
Basophils Relative: 0.3 %
MCHC: 32.9 g/dL (ref 32.0–36.0)
MPV: 13.7 fL — ABNORMAL HIGH (ref 7.5–12.5)
Neutrophils Relative %: 71.1 %

## 2022-07-31 LAB — COMPLETE METABOLIC PANEL WITH GFR
AG Ratio: 1.7 (calc) (ref 1.0–2.5)
BUN: 16 mg/dL (ref 7–25)
Total Bilirubin: 0.7 mg/dL (ref 0.2–1.2)
eGFR: 69 mL/min/{1.73_m2} (ref >=60)

## 2022-08-03 ENCOUNTER — Other Ambulatory Visit: Payer: Self-pay

## 2022-08-04 ENCOUNTER — Other Ambulatory Visit: Payer: Medicare Other

## 2022-08-04 ENCOUNTER — Other Ambulatory Visit: Payer: Self-pay | Admitting: Family Medicine

## 2022-08-04 DIAGNOSIS — D649 Anemia, unspecified: Secondary | ICD-10-CM | POA: Insufficient documentation

## 2022-08-04 DIAGNOSIS — I10 Essential (primary) hypertension: Secondary | ICD-10-CM

## 2022-08-04 DIAGNOSIS — D631 Anemia in chronic kidney disease: Secondary | ICD-10-CM | POA: Insufficient documentation

## 2022-08-04 LAB — COMPLETE METABOLIC PANEL WITH GFR
ALT: 10 U/L (ref 9–46)
AST: 14 U/L (ref 10–35)
Albumin: 3.6 g/dL (ref 3.6–5.1)
Calcium: 8.9 mg/dL (ref 8.6–10.3)
Chloride: 105 mmol/L (ref 98–110)
Potassium: 4.5 mmol/L (ref 3.5–5.3)
Total Protein: 5.7 g/dL — ABNORMAL LOW (ref 6.1–8.1)

## 2022-08-04 LAB — CBC WITH DIFFERENTIAL/PLATELET
Hemoglobin: 9.9 g/dL — ABNORMAL LOW (ref 13.2–17.1)
Lymphs Abs: 1376 cells/uL (ref 850–3900)
Monocytes Relative: 9.2 %
RBC: 3.14 10*6/uL — ABNORMAL LOW (ref 4.20–5.80)
Total Lymphocyte: 18.1 %

## 2022-08-04 LAB — B12 AND FOLATE PANEL: Folate: 11.9 ng/mL

## 2022-08-04 LAB — TEST AUTHORIZATION

## 2022-08-05 LAB — COMPLETE METABOLIC PANEL WITH GFR
Alkaline phosphatase (APISO): 59 U/L (ref 35–144)
CO2: 26 mmol/L (ref 20–32)
Creat: 1.08 mg/dL (ref 0.70–1.22)
Globulin: 2.1 g/dL (calc) (ref 1.9–3.7)
Glucose, Bld: 130 mg/dL — ABNORMAL HIGH (ref 65–99)
Sodium: 141 mmol/L (ref 135–146)

## 2022-08-05 LAB — IRON,TIBC AND FERRITIN PANEL
%SAT: 15 % (calc) — ABNORMAL LOW (ref 20–48)
Ferritin: 1188 ng/mL — ABNORMAL HIGH (ref 24–380)
Iron: 26 ug/dL — ABNORMAL LOW (ref 50–180)
TIBC: 173 mcg/dL (calc) — ABNORMAL LOW (ref 250–425)

## 2022-08-05 LAB — CBC WITH DIFFERENTIAL/PLATELET
Absolute Monocytes: 699 cells/uL (ref 200–950)
Basophils Absolute: 23 cells/uL (ref 0–200)
MCH: 31.5 pg (ref 27.0–33.0)
MCV: 95.9 fL (ref 80.0–100.0)
Platelets: 191 10*3/uL (ref 140–400)
RDW: 13.9 % (ref 11.0–15.0)
WBC: 7.6 10*3/uL (ref 3.8–10.8)

## 2022-08-05 LAB — TEST AUTHORIZATION 2

## 2022-08-05 LAB — B12 AND FOLATE PANEL: Vitamin B-12: 461 pg/mL (ref 200–1100)

## 2022-08-05 LAB — LIPASE: Lipase: 28 U/L (ref 7–60)

## 2022-08-05 LAB — TEST AUTHORIZATION

## 2022-08-09 ENCOUNTER — Other Ambulatory Visit: Payer: Self-pay | Admitting: Family Medicine

## 2022-08-09 DIAGNOSIS — D649 Anemia, unspecified: Secondary | ICD-10-CM | POA: Diagnosis not present

## 2022-08-12 ENCOUNTER — Other Ambulatory Visit: Payer: Medicare Other

## 2022-08-12 ENCOUNTER — Telehealth: Payer: Self-pay | Admitting: Family Medicine

## 2022-08-12 DIAGNOSIS — H5789 Other specified disorders of eye and adnexa: Secondary | ICD-10-CM | POA: Diagnosis not present

## 2022-08-12 DIAGNOSIS — H468 Other optic neuritis: Secondary | ICD-10-CM | POA: Diagnosis not present

## 2022-08-12 LAB — CBC WITH DIFFERENTIAL/PLATELET
Absolute Monocytes: 539 cells/uL (ref 200–950)
Basophils Absolute: 19 cells/uL (ref 0–200)
Basophils Relative: 0.3 %
Eosinophils Absolute: 130 cells/uL (ref 15–500)
Eosinophils Relative: 2.1 %
HCT: 29.5 % — ABNORMAL LOW (ref 38.5–50.0)
Hemoglobin: 9.7 g/dL — ABNORMAL LOW (ref 13.2–17.1)
Lymphs Abs: 1004 cells/uL (ref 850–3900)
MCH: 31.2 pg (ref 27.0–33.0)
MCHC: 32.9 g/dL (ref 32.0–36.0)
MCV: 94.9 fL (ref 80.0–100.0)
MPV: 13.1 fL — ABNORMAL HIGH (ref 7.5–12.5)
Monocytes Relative: 8.7 %
Neutro Abs: 4507 cells/uL (ref 1500–7800)
Neutrophils Relative %: 72.7 %
Platelets: 228 10*3/uL (ref 140–400)
RBC: 3.11 10*6/uL — ABNORMAL LOW (ref 4.20–5.80)
RDW: 14.3 % (ref 11.0–15.0)
Total Lymphocyte: 16.2 %
WBC: 6.2 10*3/uL (ref 3.8–10.8)

## 2022-08-12 LAB — SEDIMENTATION RATE: Sed Rate: 51 mm/h — ABNORMAL HIGH (ref 0–20)

## 2022-08-12 NOTE — Telephone Encounter (Signed)
Received call from Angie at [facility] to follow up on patient's appointment today with their provider Dr. Antony Contras. Angie stated patient has swelling in back of left eye. He's requesting stat labs for the following:  ESR, CRP, and CBC.  Please advise with any questions at [phone].

## 2022-08-12 NOTE — Telephone Encounter (Signed)
Angie also stated they will send the patient with an order. Please see previous message in thread.

## 2022-08-13 ENCOUNTER — Inpatient Hospital Stay (HOSPITAL_COMMUNITY): Payer: Medicare Other

## 2022-08-13 ENCOUNTER — Inpatient Hospital Stay (HOSPITAL_COMMUNITY)
Admission: EM | Admit: 2022-08-13 | Discharge: 2022-08-16 | DRG: 547 | Disposition: A | Discharge status: Home / Self Care | Payer: Medicare Other | Attending: Internal Medicine | Admitting: Internal Medicine

## 2022-08-13 ENCOUNTER — Encounter (HOSPITAL_COMMUNITY): Payer: Self-pay | Admitting: Internal Medicine

## 2022-08-13 ENCOUNTER — Other Ambulatory Visit: Payer: Self-pay

## 2022-08-13 DIAGNOSIS — I4891 Unspecified atrial fibrillation: Secondary | ICD-10-CM

## 2022-08-13 DIAGNOSIS — E1121 Type 2 diabetes mellitus with diabetic nephropathy: Secondary | ICD-10-CM | POA: Diagnosis present

## 2022-08-13 DIAGNOSIS — Z823 Family history of stroke: Secondary | ICD-10-CM | POA: Diagnosis not present

## 2022-08-13 DIAGNOSIS — I480 Paroxysmal atrial fibrillation: Secondary | ICD-10-CM | POA: Diagnosis not present

## 2022-08-13 DIAGNOSIS — Z8673 Personal history of transient ischemic attack (TIA), and cerebral infarction without residual deficits: Secondary | ICD-10-CM | POA: Diagnosis not present

## 2022-08-13 DIAGNOSIS — Z833 Family history of diabetes mellitus: Secondary | ICD-10-CM | POA: Diagnosis not present

## 2022-08-13 DIAGNOSIS — E782 Mixed hyperlipidemia: Secondary | ICD-10-CM

## 2022-08-13 DIAGNOSIS — I10 Essential (primary) hypertension: Secondary | ICD-10-CM | POA: Diagnosis present

## 2022-08-13 DIAGNOSIS — K219 Gastro-esophageal reflux disease without esophagitis: Secondary | ICD-10-CM | POA: Diagnosis present

## 2022-08-13 DIAGNOSIS — Z7982 Long term (current) use of aspirin: Secondary | ICD-10-CM

## 2022-08-13 DIAGNOSIS — H469 Unspecified optic neuritis: Secondary | ICD-10-CM | POA: Diagnosis not present

## 2022-08-13 DIAGNOSIS — M315 Giant cell arteritis with polymyalgia rheumatica: Principal | ICD-10-CM | POA: Diagnosis present

## 2022-08-13 DIAGNOSIS — I252 Old myocardial infarction: Secondary | ICD-10-CM | POA: Diagnosis not present

## 2022-08-13 DIAGNOSIS — Z8249 Family history of ischemic heart disease and other diseases of the circulatory system: Secondary | ICD-10-CM

## 2022-08-13 DIAGNOSIS — M316 Other giant cell arteritis: Secondary | ICD-10-CM | POA: Diagnosis not present

## 2022-08-13 DIAGNOSIS — H5462 Unqualified visual loss, left eye, normal vision right eye: Secondary | ICD-10-CM | POA: Diagnosis present

## 2022-08-13 DIAGNOSIS — Z79899 Other long term (current) drug therapy: Secondary | ICD-10-CM | POA: Diagnosis not present

## 2022-08-13 DIAGNOSIS — M109 Gout, unspecified: Secondary | ICD-10-CM | POA: Diagnosis not present

## 2022-08-13 DIAGNOSIS — H9190 Unspecified hearing loss, unspecified ear: Secondary | ICD-10-CM | POA: Diagnosis not present

## 2022-08-13 DIAGNOSIS — I429 Cardiomyopathy, unspecified: Secondary | ICD-10-CM | POA: Diagnosis not present

## 2022-08-13 DIAGNOSIS — Z800 Family history of malignant neoplasm of digestive organs: Secondary | ICD-10-CM

## 2022-08-13 DIAGNOSIS — K257 Chronic gastric ulcer without hemorrhage or perforation: Secondary | ICD-10-CM | POA: Diagnosis present

## 2022-08-13 DIAGNOSIS — I6782 Cerebral ischemia: Secondary | ICD-10-CM | POA: Diagnosis not present

## 2022-08-13 DIAGNOSIS — I2510 Atherosclerotic heart disease of native coronary artery without angina pectoris: Secondary | ICD-10-CM | POA: Diagnosis present

## 2022-08-13 DIAGNOSIS — I5020 Unspecified systolic (congestive) heart failure: Secondary | ICD-10-CM | POA: Diagnosis not present

## 2022-08-13 DIAGNOSIS — I5022 Chronic systolic (congestive) heart failure: Secondary | ICD-10-CM | POA: Diagnosis not present

## 2022-08-13 DIAGNOSIS — J4489 Other specified chronic obstructive pulmonary disease: Secondary | ICD-10-CM | POA: Diagnosis present

## 2022-08-13 DIAGNOSIS — R011 Cardiac murmur, unspecified: Secondary | ICD-10-CM | POA: Diagnosis present

## 2022-08-13 DIAGNOSIS — E785 Hyperlipidemia, unspecified: Secondary | ICD-10-CM | POA: Diagnosis present

## 2022-08-13 DIAGNOSIS — J449 Chronic obstructive pulmonary disease, unspecified: Secondary | ICD-10-CM | POA: Diagnosis not present

## 2022-08-13 DIAGNOSIS — T380X5A Adverse effect of glucocorticoids and synthetic analogues, initial encounter: Secondary | ICD-10-CM | POA: Diagnosis not present

## 2022-08-13 DIAGNOSIS — R7982 Elevated C-reactive protein (CRP): Secondary | ICD-10-CM | POA: Diagnosis present

## 2022-08-13 DIAGNOSIS — Z951 Presence of aortocoronary bypass graft: Secondary | ICD-10-CM | POA: Diagnosis not present

## 2022-08-13 DIAGNOSIS — E119 Type 2 diabetes mellitus without complications: Secondary | ICD-10-CM

## 2022-08-13 DIAGNOSIS — I671 Cerebral aneurysm, nonruptured: Secondary | ICD-10-CM | POA: Diagnosis present

## 2022-08-13 DIAGNOSIS — I6381 Other cerebral infarction due to occlusion or stenosis of small artery: Secondary | ICD-10-CM | POA: Diagnosis not present

## 2022-08-13 DIAGNOSIS — Z87891 Personal history of nicotine dependence: Secondary | ICD-10-CM | POA: Diagnosis not present

## 2022-08-13 DIAGNOSIS — I5031 Acute diastolic (congestive) heart failure: Secondary | ICD-10-CM | POA: Diagnosis not present

## 2022-08-13 DIAGNOSIS — H538 Other visual disturbances: Secondary | ICD-10-CM | POA: Diagnosis not present

## 2022-08-13 DIAGNOSIS — M1990 Unspecified osteoarthritis, unspecified site: Secondary | ICD-10-CM | POA: Diagnosis present

## 2022-08-13 DIAGNOSIS — I255 Ischemic cardiomyopathy: Secondary | ICD-10-CM | POA: Diagnosis present

## 2022-08-13 DIAGNOSIS — I340 Nonrheumatic mitral (valve) insufficiency: Secondary | ICD-10-CM | POA: Diagnosis not present

## 2022-08-13 DIAGNOSIS — R519 Headache, unspecified: Secondary | ICD-10-CM | POA: Diagnosis not present

## 2022-08-13 DIAGNOSIS — I110 Hypertensive heart disease with heart failure: Secondary | ICD-10-CM | POA: Diagnosis not present

## 2022-08-13 DIAGNOSIS — I482 Chronic atrial fibrillation, unspecified: Secondary | ICD-10-CM

## 2022-08-13 DIAGNOSIS — H468 Other optic neuritis: Secondary | ICD-10-CM | POA: Diagnosis not present

## 2022-08-13 LAB — CBC WITH DIFFERENTIAL/PLATELET
Abs Immature Granulocytes: 0.04 10*3/uL (ref 0.00–0.07)
Basophils Absolute: 0 10*3/uL (ref 0.0–0.1)
Basophils Relative: 1 %
Eosinophils Absolute: 0.1 10*3/uL (ref 0.0–0.5)
Eosinophils Relative: 1 %
HCT: 30.7 % — ABNORMAL LOW (ref 39.0–52.0)
Hemoglobin: 9.5 g/dL — ABNORMAL LOW (ref 13.0–17.0)
Immature Granulocytes: 1 %
Lymphocytes Relative: 18 %
Lymphs Abs: 1.3 10*3/uL (ref 0.7–4.0)
MCH: 30.4 pg (ref 26.0–34.0)
MCHC: 30.9 g/dL (ref 30.0–36.0)
MCV: 98.1 fL (ref 80.0–100.0)
Monocytes Absolute: 0.5 10*3/uL (ref 0.1–1.0)
Monocytes Relative: 8 %
Neutro Abs: 5 10*3/uL (ref 1.7–7.7)
Neutrophils Relative %: 71 %
Platelets: 274 10*3/uL (ref 150–400)
RBC: 3.13 MIL/uL — ABNORMAL LOW (ref 4.22–5.81)
RDW: 15.4 % (ref 11.5–15.5)
WBC: 7 10*3/uL (ref 4.0–10.5)
nRBC: 0.4 % — ABNORMAL HIGH (ref 0.0–0.2)

## 2022-08-13 LAB — BASIC METABOLIC PANEL
Anion gap: 11 (ref 5–15)
BUN: 10 mg/dL (ref 8–23)
CO2: 23 mmol/L (ref 22–32)
Calcium: 8.9 mg/dL (ref 8.9–10.3)
Chloride: 106 mmol/L (ref 98–111)
Creatinine, Ser: 1.13 mg/dL (ref 0.61–1.24)
GFR, Estimated: >60 mL/min (ref >60)
Glucose, Bld: 169 mg/dL — ABNORMAL HIGH (ref 70–99)
Potassium: 3.9 mmol/L (ref 3.5–5.1)
Sodium: 140 mmol/L (ref 135–145)

## 2022-08-13 LAB — CBG MONITORING, ED
Glucose-Capillary: 108 mg/dL — ABNORMAL HIGH (ref 70–99)
Glucose-Capillary: 135 mg/dL — ABNORMAL HIGH (ref 70–99)

## 2022-08-13 LAB — SEDIMENTATION RATE: Sed Rate: 41 mm/hr — ABNORMAL HIGH (ref 0–16)

## 2022-08-13 MED ORDER — METOPROLOL TARTRATE 25 MG PO TABS: 75.0000 mg | ORAL_TABLET | Freq: Two times a day (BID) | ORAL | Status: DC

## 2022-08-13 MED ORDER — ACETAMINOPHEN 650 MG RE SUPP: 650.0000 mg | Freq: Four times a day (QID) | RECTAL | Status: DC | PRN

## 2022-08-13 MED ORDER — ACETAMINOPHEN 325 MG PO TABS: 650.0000 mg | ORAL_TABLET | Freq: Four times a day (QID) | ORAL | Status: DC | PRN

## 2022-08-13 MED ORDER — INSULIN ASPART 100 UNIT/ML IJ SOLN
0.0000 [IU] | Freq: Three times a day (TID) | INTRAMUSCULAR | Status: DC
  Administered 2022-08-14: 5 [IU] via SUBCUTANEOUS
  Administered 2022-08-14 – 2022-08-15 (×3): 2 [IU] via SUBCUTANEOUS
  Administered 2022-08-15: 3 [IU] via SUBCUTANEOUS
  Administered 2022-08-16: 2 [IU] via SUBCUTANEOUS

## 2022-08-13 MED ORDER — ONDANSETRON HCL 4 MG PO TABS: 4.0000 mg | ORAL_TABLET | Freq: Four times a day (QID) | ORAL | Status: DC | PRN

## 2022-08-13 MED ORDER — METOPROLOL TARTRATE 50 MG PO TABS
50.0000 mg | ORAL_TABLET | Freq: Two times a day (BID) | ORAL | Status: DC
  Administered 2022-08-13 – 2022-08-14 (×2): 50 mg via ORAL
  Filled 2022-08-13: qty 1
  Filled 2022-08-13: qty 2

## 2022-08-13 MED ORDER — DOCUSATE SODIUM 100 MG PO CAPS
100.0000 mg | ORAL_CAPSULE | Freq: Two times a day (BID) | ORAL | Status: DC
  Administered 2022-08-13 – 2022-08-15 (×2): 100 mg via ORAL
  Filled 2022-08-13 (×5): qty 1

## 2022-08-13 MED ORDER — ROSUVASTATIN CALCIUM 20 MG PO TABS
20.0000 mg | ORAL_TABLET | Freq: Every day | ORAL | Status: DC
  Administered 2022-08-13 – 2022-08-16 (×4): 20 mg via ORAL
  Filled 2022-08-13 (×4): qty 1

## 2022-08-13 MED ORDER — DOXAZOSIN MESYLATE 2 MG PO TABS
2.0000 mg | ORAL_TABLET | Freq: Every day | ORAL | Status: DC
  Administered 2022-08-13 – 2022-08-16 (×4): 2 mg via ORAL
  Filled 2022-08-13 (×4): qty 1

## 2022-08-13 MED ORDER — PANTOPRAZOLE SODIUM 40 MG PO TBEC
40.0000 mg | DELAYED_RELEASE_TABLET | Freq: Every day | ORAL | Status: DC
  Administered 2022-08-14 – 2022-08-16 (×3): 40 mg via ORAL
  Filled 2022-08-13 (×3): qty 1

## 2022-08-13 MED ORDER — MIRTAZAPINE 15 MG PO TABS
15.0000 mg | ORAL_TABLET | Freq: Every day | ORAL | Status: DC
  Administered 2022-08-13 – 2022-08-15 (×3): 15 mg via ORAL
  Filled 2022-08-13 (×4): qty 1

## 2022-08-13 MED ORDER — INSULIN ASPART 100 UNIT/ML IJ SOLN
0.0000 [IU] | Freq: Every day | INTRAMUSCULAR | Status: DC
  Administered 2022-08-14: 4 [IU] via SUBCUTANEOUS

## 2022-08-13 MED ORDER — ONDANSETRON HCL 4 MG/2ML IJ SOLN: 4.0000 mg | Freq: Four times a day (QID) | INTRAMUSCULAR | Status: DC | PRN

## 2022-08-13 MED ORDER — LACTATED RINGERS IV SOLN: INTRAVENOUS | Status: DC

## 2022-08-13 MED ORDER — HEPARIN SODIUM (PORCINE) 5000 UNIT/ML IJ SOLN
5000.0000 [IU] | Freq: Three times a day (TID) | INTRAMUSCULAR | Status: DC
  Administered 2022-08-13 – 2022-08-14 (×2): 5000 [IU] via SUBCUTANEOUS
  Filled 2022-08-13 (×2): qty 1

## 2022-08-13 MED ORDER — ASPIRIN 81 MG PO CHEW
81.0000 mg | CHEWABLE_TABLET | Freq: Every day | ORAL | Status: DC
  Administered 2022-08-14 – 2022-08-16 (×3): 81 mg via ORAL
  Filled 2022-08-13 (×4): qty 1

## 2022-08-13 MED ORDER — SODIUM CHLORIDE 0.9 % IV SOLN
1000.0000 mg | Freq: Every day | INTRAVENOUS | Status: DC
  Administered 2022-08-13 – 2022-08-15 (×3): 1000 mg via INTRAVENOUS
  Filled 2022-08-13 (×7): qty 16

## 2022-08-13 MED ORDER — LORAZEPAM 2 MG/ML IJ SOLN: 1.0000 mg | Freq: Once | INTRAMUSCULAR | Status: DC | PRN

## 2022-08-13 NOTE — Consult Note (Signed)
Neurology Consultation Reason for Consult: vision loss, temporal arteritis Referring Physician: Dr. Thedore Mins  CC: temporal arteritis  History is obtained from: patient and chart review  HPI: Donald Rios is a 82 y.o. male with past medical history significant for DM2, dyslipidemia ,CAD s/p CABG, ischemic cardiomyopathy with EF 45%, gout, cataract, history of GI bleed who presented to the ED 8/2 with complaints of left eye vision loss.  Patient states Wednesday night he began having blurry vision in his left eye, it has gotten worse since. He went to his ophthalmologist Thursday who diagnosed him with disc edema and sent him for labs.  ESR and CRP came back quite high and patient was instructed to go to the ED for evaluation for giant cell arteritis.  Also, in ED patient was in A-fib with RVR, no history of Afib noted.  On exam, patient is alert and oriented with no focal weakness or sensory deficits.  He does have vision loss in his left eye, able to see outlines of shapes and some colors but unable to discern specifics like the amount of fingers being held up or able to read.  Patient denies pain in left eye or when left temple area is touched.  Patient does endorse jaw claudication when chewing food.  He does not have pain when opening and closing jaw without chewing. Patient has already received 1 dose of Solu-Medrol.   ROS: A 14 point ROS was performed and is negative except as noted in the HPI.  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    History of MI  (myocardial infarction)    History of nuclear stress test 08/07/2010   dipyridamole; EKG negative for ischemia, low risk scan    Hyperlipidemia    Hyperplastic colon polyp 05/2008   Hypertension    Ischemic cardiomyopathy    EF 45%, with inferior wall motion abnormality    Myocardial infarction (HCC)    Valvular regurgitation    mitral and tricuspid (mild)    Family History  Problem Relation Age of Onset   Stroke Father    Hypertension Father    Stroke Maternal Grandmother    Stroke Maternal Grandfather    Diabetes Brother    Esophageal cancer Brother    Stomach cancer Maternal Aunt    Colon cancer Neg Hx    Rectal cancer Neg Hx     Social History:  reports that he quit smoking about 36 years ago. His smoking use included cigarettes. He has never used smokeless tobacco. He reports current alcohol use of about 2.0 - 3.0 standard drinks of alcohol per week. He reports that he does not use drugs.  Exam: Current vital signs: BP 122/72 (BP Location: Right Arm)   Pulse 89   Temp 98 F (36.7 C) (Oral)   Resp 14   Ht 5\' 8"  (1.727 m)   Wt 68 kg   SpO2 100%   BMI 22.81 kg/m  Vital  signs in last 24 hours: Temp:  [98 F (36.7 C)-98.4 F (36.9 C)] 98 F (36.7 C) (08/02 1430) Pulse Rate:  [82-89] 89 (08/02 1430) Resp:  [14-20] 14 (08/02 1430) BP: (116-123)/(63-72) 122/72 (08/02 1430) SpO2:  [97 %-100 %] 100 % (08/02 1430) Weight:  [68 kg] 68 kg (08/02 1430)   Physical Exam  Appears well-developed and well-nourished.   Neuro: Mental Status: Patient is awake, alert, oriented to person, place, month, year, and situation. Patient is able to give a clear and coherent history. No signs of aphasia or neglect Cranial Nerves: II: Decreased vision in left eye.  Only able to see outlines of shapes and colors.  Unable to discern number of fingers being held up, unable to read close up for at a distance.+ apd III,IV, VI: EOMI without pain, ptosis or diploplia.  V: Facial sensation  is symmetric to temperature VII: Facial movement is symmetric.  VIII: hearing is intact to voice X: Uvula elevates symmetrically XI: Shoulder shrug is symmetric. XII: tongue is midline without atrophy or fasciculations.  Motor: Tone is normal. Bulk is normal. 5/5 strength was present in all four extremities.  Sensory: Sensation is symmetric to light touch and temperature in the arms and legs. Cerebellar: FNF and HKS are intact bilaterally   I have reviewed labs in epic and the results pertinent to this consultation are: Sed Rate: 51 -> 41 CRP: 56.3   Impression: Donald Rios is a 82 y.o. male with PMH of DM2, dyslipidemia ,CAD s/p CABG, EF 45%, who presented to the ED 8/2 with complaints of left eye vision loss, worsening since Wednesday night. Endorses jaw claudication. 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   Recommendations: - Continue Solu-Medrol 1g daily  - Agree with CT/MRI/MRA to evaluate for stroke  If positive, will order stroke work-up - heparin for afib  Pt seen by Neuro NP/APP and later by MD. Note/plan to be edited by MD as needed.    Lynnae January, DNP, AGACNP-BC [facility] Please use AMION for contact information & EPIC for messaging.  I have seen the patient reviewed the above note.  He had an ophthalmological exam highly concerning for temporal arteritis and presents with jaw claudication.  He also has several weeks of neck pain which could be concerning for polymyalgia rheumatica.  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  He also does have atrial fibrillation as well.  Given the visual involvement, I would favor IV Solu-Medrol for 3 days prior to starting prednisone.  I will also get a temporal artery ultrasound, but he will likely need vascular surgery consultation for temporal artery biopsy.  Ritta Slot, MD [facility] [phone]  If 7pm- 7am, please page neurology on call as listed in AMION.

## 2022-08-13 NOTE — ED Notes (Signed)
Attempted x2 to placed IC catheter. Coworker help requested.

## 2022-08-13 NOTE — Telephone Encounter (Signed)
As requested by office of Dr. Antony Contras, stat lab results faxed to them at [id number] with a confirmation time stamp of 08-13-2022 10:45. Please see previous notes in thread.

## 2022-08-13 NOTE — H&P (Addendum)
TRH H&P   Patient Demographics:    Donald Rios, is a 82 y.o. male  MRN: [id number]   DOB - 10/19/1940  Admit Date - 08/13/2022  Outpatient Primary MD for the patient is [name], MD  Outpatient Specialists: Dr Rennis Golden    Patient coming from:  [facility] ER  Chief Complaint  Patient presents with   Temporal Arteritis      HPI:    Donald Rios  is a 82 y.o. 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  Neurology was consulted and I was requested to admit the patient.  Sides above history which is positive for almost complete left eye vision loss gradually progressive for the last day and a half his review of system is also positive for bilateral jaw claudication while eating food, this is also going on for 2 to 3 days, he does not have any headache, no fever chills or fatigue, no unwanted weight loss, no tingling numbness or focal weakness.  No palpitations.  All other review systems are negative.    Review of  systems:    A full 10 point Review of Systems was done, except as stated above, all other Review of Systems were negative.   With Past History of the following :    Past Medical History:  Diagnosis Date   Acute gastric ulcer with hemorrhage 05/06/2013   Arthritis    Asthma    Atrial enlargement, left    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    Cataract    COPD (chronic obstructive pulmonary disease) (HCC)  Diabetes mellitus    Diabetic nephropathy (HCC)    Diverticulosis    ED (erectile dysfunction)    GERD (gastroesophageal reflux disease)    Gout    Hearing loss    Hiatal hernia 05/2008   EGD with HH and reflux esophagitis.    History of MI (myocardial infarction)    History of nuclear stress test 08/07/2010   dipyridamole; EKG negative for ischemia, low risk scan    Hyperlipidemia    Hyperplastic colon polyp 05/2008   Hypertension    Ischemic cardiomyopathy    EF 45%, with inferior wall motion abnormality    Myocardial infarction (HCC)    Valvular regurgitation    mitral and tricuspid (mild)      Past Surgical History:  Procedure Laterality Date   CORONARY ARTERY BYPASS GRAFT  2000   LIMA to LAD, free IMA to OM2, sequential graft to PLA & PLD   ESOPHAGOGASTRODUODENOSCOPY N/A 05/07/2013   Procedure: ESOPHAGOGASTRODUODENOSCOPY (EGD);  Surgeon: Iva Boop, MD;  Location: [facility] ENDOSCOPY;  Service: Endoscopy;  Laterality: N/A;   TRANSTHORACIC ECHOCARDIOGRAM  09/07/2010   EF 45-50%; LV systolic function mildly reduced; LA mildly dilated; mild-mod MR & mild-mod TR; aortic root sclerosis/calcification;       Social History:     Social History   Tobacco Use   Smoking status: Former    Current packs/day: 0.00    Types: Cigarettes    Quit date: 11/04/1985    Years since quitting: 36.7   Smokeless tobacco: Never  Substance Use Topics   Alcohol use: Yes    Alcohol/week: 2.0 - 3.0 standard drinks of alcohol    Types: 2 - 3 Cans of beer per week          Family History :     Family History  Problem Relation Age of Onset   Stroke Father    Hypertension Father    Stroke Maternal Grandmother    Stroke Maternal Grandfather    Diabetes Brother    Esophageal cancer Brother    Stomach cancer Maternal Aunt    Colon cancer Neg Hx    Rectal cancer Neg Hx        Home Medications:   Prior to Admission medications   Medication Sig Start Date End Date Taking? Authorizing Provider  allopurinol (ZYLOPRIM) 300 MG tablet TAKE 1 TABLET BY MOUTH DAILY 05/03/22   [name], MD  aspirin 81 MG tablet Take 81 mg by mouth daily.    [provider]  benazepril (LOTENSIN) 40 MG tablet TAKE 1 TABLET BY MOUTH DAILY 06/08/22   [name], MD  doxazosin (CARDURA) 2 MG tablet TAKE 1 TABLET BY MOUTH DAILY 08/04/22   [name], MD  metoprolol tartrate (LOPRESSOR) 50 MG tablet TAKE 1 AND 1/2 TABLET BY MOUTH TWO TIMES A DAY 02/17/22   [name], MD  mirtazapine (REMERON) 30 MG tablet TAKE 1 TABLET BY MOUTH EVERY NIGHT AT BEDTIME FOR APPETITE 05/31/22   [name], MD  pantoprazole (PROTONIX) 40 MG tablet Take 1 tablet (40 mg total) by mouth daily. 07/30/22   [name], MD  simvastatin (ZOCOR) 40 MG tablet TAKE ONE TABLET BY MOUTH DAILY 05/17/22   [name], MD     Allergies:    No Known Allergies   Physical Exam:   Vitals  Blood pressure 122/72, pulse 89, temperature 98 F (36.7 C), temperature source Oral, resp. rate 14, height 5\' 8"  (1.727 m),  weight 68 kg, SpO2 100%.   1. General -elderly Caucasian male lying in hospital bed in no apparent discomfort, near total left eye vision loss  2. Normal affect and insight, Not Suicidal or Homicidal, Awake Alert,   3. No F.N deficits, ALL C.Nerves Intact, Strength 5/5 all 4 extremities, Sensation intact all 4 extremities, Plantars down going.  Near-total left eye vision loss  4. Ears and Eyes appear Normal, Conjunctivae clear, PERRLA. Moist Oral  Mucosa.  5. Supple Neck, No JVD, No cervical lymphadenopathy appriciated, No Carotid Bruits.  6. Symmetrical Chest wall movement, Good air movement bilaterally, CTAB.  7. iRRR, No Gallops, Rubs or Murmurs, No Parasternal Heave.  8. Positive Bowel Sounds, Abdomen Soft, No tenderness, No organomegaly appriciated,No rebound -guarding or rigidity.  9.  No Cyanosis, Normal Skin Turgor, No Skin Rash or Bruise.  10. Good muscle tone,  joints appear normal , no effusions, Normal ROM.  11. No Palpable Lymph Nodes in Neck or Axillae      Data Review:   Recent Labs  Lab 08/12/22 1132 08/13/22 1340  WBC 6.2 7.0  HGB 9.7* 9.5*  HCT 29.5* 30.7*  PLT 228 274  MCV 94.9 98.1  MCH 31.2 30.4  MCHC 32.9 30.9  RDW 14.3 15.4  LYMPHSABS 1,004 1.3  MONOABS  --  0.5  EOSABS 130 0.1  BASOSABS 19 0.0    Recent Labs  Lab 08/12/22 1132 08/13/22 1340  NA  --  140  K  --  3.9  CL  --  106  CO2  --  23  ANIONGAP  --  11  GLUCOSE  --  169*  BUN  --  10  CREATININE  --  1.13  CRP 56.3*  --   CALCIUM  --  8.9    Lab Results  Component Value Date   CHOL 115 05/14/2022   HDL 57 05/14/2022   LDLCALC 46 05/14/2022   TRIG 42 05/14/2022   CHOLHDL 2.0 05/14/2022    Recent Labs  Lab 08/12/22 1132 08/13/22 1340  CRP 56.3*  --   CALCIUM  --  8.9    Recent Labs  Lab 08/12/22 1132 08/13/22 1340  WBC 6.2 7.0  PLT 228 274  CRP 56.3*  --   CREATININE  --  1.13    Urinalysis    Component Value Date/Time   COLORURINE YELLOW 02/10/2021 1600   APPEARANCEUR SLIGHTLY CLOUDY (A) 02/10/2021 1600   LABSPEC 1.015 02/10/2021 1600   PHURINE 5.5 02/10/2021 1600   GLUCOSEU NEGATIVE 02/10/2021 1600   HGBUR TRACE (A) 02/10/2021 1600   BILIRUBINUR NEGATIVE 05/06/2013 2202   KETONESUR NEGATIVE 02/10/2021 1600   PROTEINUR NEGATIVE 02/10/2021 1600   UROBILINOGEN 0.2 05/06/2013 2202   NITRITE NEGATIVE 02/10/2021 1600   LEUKOCYTESUR 3+ (A) 02/10/2021 1600      Imaging Results:    No  results found.  My personal review of EKG: Atrial fibrillation, underlying left bundle branch block   Assessment & Plan:   Gradually progressive but near total left eye vision loss ongoing for day and a half, near total vision loss for the last 12 hours.  With elevated ESR and CRP, seen by ophthalmologist found to have disc edema, per ophthalmologist highly suspicious for giant cell arteritis, he also has bilateral jaw claudication for about 2 to 3 days.  At this time neurology has been consulted, he has been started on 1 g IV Solu-Medrol daily, will proceed with MRI brain, MRA head and neck, echocardiogram,  A1c and lipid panel.  Still prudent to rule out stroke.  Continue home dose aspirin for now, head CT will be obtained first to rule out any minimal chance of bleed.  Neurology on board will be seeing the patient shortly.  2.  CAD s/p CABG.  No acute issues no chest pain, continue home dose aspirin, statin and beta-blocker for secondary prevention.  Cardiology to see.  3.  New diagnosis of atrial fibrillation.  [place] vas 2 score of greater than 3.  Currently in rate controlled continue beta-blocker, check TSH and echo, continue above dictated medications, once bleed is ruled out will defer anticoagulation to cardiology.  Await head CT first.  Cardiology has been consulted will see the patient shortly.  4.  Previous history of ischemic cardiomyopathy now resolved last echocardiogram in 2021 shows a preserved EF of 65%.  Continue to monitor.  5.  Dyslipidemia.  Continue him on statin, check lipid panel in the morning along with A1c.  6.  GERD with history of GI bleed.  PPI.  7.  Essential hypertension.  Home dose beta-blocker.    8. DM type II.  Currently in rate control, check A1c, add sliding scale due to high-dose steroids.   DVT Prophylaxis Heparin    AM Labs Ordered, also please review Full Orders  Family Communication: Admission, patients condition and plan of care including tests  being ordered have been discussed with the patient  who indicates understanding and agree with the plan and Code Status.  Code Status full code  Likely DC to to be decided  Condition GUARDED    Consults called: Neurology, [place] cardiology  Admission status: Inpatient  Time spent in minutes : 45 minutes  Signature  -    Susa Raring M.D on 08/13/2022 at 4:14 PM   -  To page go to [email or web address]

## 2022-08-13 NOTE — ED Notes (Signed)
ED TO INPATIENT HANDOFF REPORT  ED Nurse Name and Phone #: Amil Amen [phone]  S Name/Age/Gender Donald Rios 82 y.o. male Room/Bed: 039C/039C  Code Status   Code Status: Full Code  Home/SNF/Other Home Patient oriented to: self, place, time, and situation Is this baseline? 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 Pt states L eye has been having increasingly blurry vision.    Allergies No Known Allergies  Level of Care/Admitting Diagnosis ED Disposition     ED Disposition  Admit   Condition  --   Comment  Hospital Area: [facility] [[id number]]  Level of Care: Telemetry Medical [[id number]]  May admit patient to [facility] or [facility] if equivalent level of care is available:: No  Covid Evaluation: Asymptomatic - no recent exposure (last 10 days) testing not required  Diagnosis: Temporal arteritis [facility]) [[id number]]  Admitting Physician: Mee Hives  Attending Physician: Leroy Sea [[id number]]  Bed request comments: 5W  Certification:: I certify there are rare and unusual circumstances requiring inpatient admission  Estimated Length of Stay: 2          B Medical/Surgery History Past Medical History:  Diagnosis Date   Acute gastric ulcer with hemorrhage 05/06/2013   Arthritis    Asthma    Atrial enlargement, left    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    History of MI (myocardial infarction)    History of nuclear stress test 08/07/2010   dipyridamole; EKG negative for ischemia, low risk scan    Hyperlipidemia     Hyperplastic colon polyp 05/2008   Hypertension    Ischemic cardiomyopathy    EF 45%, with inferior wall motion abnormality    Myocardial infarction (HCC)    Valvular regurgitation    mitral and tricuspid (mild)   Past Surgical History:  Procedure Laterality Date   CORONARY ARTERY BYPASS GRAFT  2000   LIMA to LAD, free IMA to OM2, sequential graft to PLA & PLD   ESOPHAGOGASTRODUODENOSCOPY N/A 05/07/2013   Procedure: ESOPHAGOGASTRODUODENOSCOPY (EGD);  Surgeon: Iva Boop, MD;  Location: [facility] ENDOSCOPY;  Service: Endoscopy;  Laterality: N/A;   TRANSTHORACIC ECHOCARDIOGRAM  09/07/2010   EF 45-50%; LV systolic function mildly reduced; LA mildly dilated; mild-mod MR & mild-mod TR; aortic root sclerosis/calcification;      A IV Location/Drains/Wounds Patient Lines/Drains/Airways Status     Active Line/Drains/Airways     Name Placement date Placement time Site Days   Peripheral IV 05/07/13 Right;Anterior Forearm 05/07/13  1326  Forearm  3385   Peripheral IV 08/13/22 20 G Anterior;Distal;Right;Upper Arm 08/13/22  2126  Arm  less than 1            Intake/Output Last 24 hours No intake or output data in the 24 hours ending 08/13/22 2215  Labs/Imaging Results for orders placed or performed during the hospital encounter of 08/13/22 (from the past 48 hour(s))  Sedimentation rate     Status: Abnormal   Collection Time: 08/13/22  1:40 PM  Result Value Ref Range   Sed Rate 41 (H) 0 - 16 mm/hr    Comment: Performed at [facility], [address], [place], [place] [address]  Basic metabolic panel     Status: Abnormal   Collection Time: 08/13/22  1:40 PM  Result Value Ref Range   Sodium 140 135 - 145 mmol/L   Potassium 3.9 3.5 - 5.1 mmol/L   Chloride 106 98 - 111 mmol/L   CO2 23 22 - 32 mmol/L   Glucose, Bld 169 (H) 70 - 99 mg/dL    Comment: Glucose reference range applies only to samples taken after fasting for at least 8 hours.   BUN 10 8 - 23 mg/dL   Creatinine, Ser  6.04 0.61 - 1.24 mg/dL   Calcium 8.9 8.9 - 54.0 mg/dL   GFR, Estimated >98 >11 mL/min    Comment: (NOTE) Calculated using the CKD-EPI Creatinine Equation (2021)    Anion gap 11 5 - 15    Comment: Performed at [facility], [address] [place], [place] [address]  CBC with Differential     Status: Abnormal   Collection Time: 08/13/22  1:40 PM  Result Value Ref Range   WBC 7.0 4.0 - 10.5 K/uL   RBC 3.13 (L) 4.22 - 5.81 MIL/uL   Hemoglobin 9.5 (L) 13.0 - 17.0 g/dL   HCT 29.5 (L) 62.1 - 30.8 %   MCV 98.1 80.0 - 100.0 fL   MCH 30.4 26.0 - 34.0 pg   MCHC 30.9 30.0 - 36.0 g/dL   RDW 65.7 84.6 - 96.2 %   Platelets 274 150 - 400 K/uL   nRBC 0.4 (H) 0.0 - 0.2 %   Neutrophils Relative % 71 %   Neutro Abs 5.0 1.7 - 7.7 K/uL   Lymphocytes Relative 18 %   Lymphs Abs 1.3 0.7 - 4.0 K/uL   Monocytes Relative 8 %   Monocytes Absolute 0.5 0.1 - 1.0 K/uL   Eosinophils Relative 1 %   Eosinophils Absolute 0.1 0.0 - 0.5 K/uL   Basophils Relative 1 %   Basophils Absolute 0.0 0.0 - 0.1 K/uL   Immature Granulocytes 1 %   Abs Immature Granulocytes 0.04 0.00 - 0.07 K/uL    Comment: Performed at [facility], [address] [place], [place] [address]  CBG monitoring, ED     Status: Abnormal   Collection Time: 08/13/22  6:21 PM  Result Value Ref Range   Glucose-Capillary 108 (H) 70 - 99 mg/dL    Comment: Glucose reference range applies only to samples taken after fasting for at least 8 hours.  CBG monitoring, ED     Status: Abnormal   Collection Time: 08/13/22  9:35 PM  Result Value Ref Range   Glucose-Capillary 135 (H) 70 - 99 mg/dL    Comment: Glucose reference range applies only to samples taken after fasting for at least 8 hours.   CT HEAD WO CONTRAST ( )  Result Date: 08/13/2022 CLINICAL DATA:  Initial evaluation for headache, neuro deficit. EXAM: CT HEAD WITHOUT CONTRAST TECHNIQUE: Contiguous axial images were obtained from the base of the skull through the vertex without  intravenous contrast. RADIATION DOSE REDUCTION: This exam was performed according to the departmental dose-optimization program which includes automated exposure control, adjustment of the mA and/or kV according to patient size and/or use of iterative reconstruction technique. COMPARISON:  Prior brain MRI performed earlier the same day. FINDINGS: Brain: Mild age-related cerebral atrophy with chronic  small vessel ischemic disease. Remote infarcts involving the right basal ganglia, left thalamus, and left cerebellum. No acute intracranial hemorrhage. No acute large vessel territory infarct. No mass lesion or midline shift. No hydrocephalus or extra-axial fluid collection. Vascular: No abnormal hyperdense vessel. Calcified atherosclerosis present at skull base. Skull: Scalp soft tissues demonstrate no acute finding. Calvarium intact. Sinuses/Orbits: Globes and orbital soft tissues within normal limits. Paranasal sinuses and mastoid air cells are largely clear. Other: None. IMPRESSION: 1. No acute intracranial abnormality. 2. Remote infarcts involving the right basal ganglia, left thalamus, and left cerebellum. 3. Mild age-related cerebral atrophy with chronic small vessel ischemic disease. Electronically Signed   By: Rise Mu M.D.   On: 08/13/2022 20:46   MR BRAIN WO CONTRAST  Result Date: 08/13/2022 CLINICAL DATA:  Initial evaluation for headache, neuro deficit. EXAM: MRI HEAD WITHOUT CONTRAST MRA HEAD WITHOUT CONTRAST MRA NECK WITHOUT CONTRAST TECHNIQUE: Multiplanar, multiecho pulse sequences of the brain and surrounding structures were obtained without intravenous contrast. Angiographic images of the Circle of Willis were obtained using MRA technique without intravenous contrast. Angiographic images of the neck were obtained using MRA technique without intravenous contrast. Carotid stenosis measurements (when applicable) are obtained utilizing NASCET criteria, using the distal internal carotid  diameter as the denominator. COMPARISON:  None Available. FINDINGS: MRI HEAD FINDINGS Brain: Cerebral volume within normal limits. Mild chronic microvascular ischemic disease for age. Remote lacunar infarct present at the anterior right basal ganglia. Additional small remote left cerebellar infarct. Associated chronic blood products noted about these chronic infarcts. Additional chronic lacunar infarct present at the left thalamocapsular region. No evidence for acute or subacute ischemia. Gray-white matter differentiation maintained. No acute intracranial hemorrhage. Innumerable punctate chronic micro hemorrhages noted involving both cerebral hemispheres, overall pattern nonspecific. No mass lesion, midline shift or mass effect. No hydrocephalus or extra-axial fluid collection. Pituitary gland and suprasellar region within normal limits. Vascular: Major intracranial vascular flow voids are maintained. Skull and upper cervical spine: Cranial junction within normal limits. Bone marrow signal intensity normal. No scalp soft tissue abnormality. Sinuses/Orbits: Prior bilateral ocular lens replacement. Paranasal sinuses are largely clear. Trace right mastoid effusion noted to left of significance. Other: None. MRA HEAD FINDINGS ANTERIOR CIRCULATION: Visualized distal cervical segments of both internal carotid arteries are patent with antegrade flow. Atheromatous irregularity about the cavernous ICAs bilaterally without hemodynamically significant stenosis. 2 mm outpouching extending anteriorly from the cavernous right ICA consistent with a small aneurysm (series 5, image 77). Additional 3 mm outpouching extending posteriorly from the cavernous left ICA also suspicious for aneurysm (series 5, image 76). 3 mm outpouching extending inferiorly from the supraclinoid left ICA could reflect an additional small aneurysm (series 5, image 87). A1 segments patent bilaterally. Normal anterior communicating complex. Anterior cerebral  arteries widely patent. No M1 stenosis or occlusion. 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 Distal MCA branches perfused and symmetric. POSTERIOR CIRCULATION: Both V4 segments are patent without stenosis. Right vertebral artery dominant. Right PICA patent at its origin. Left PICA origin not seen. Basilar patent without stenosis. Superior cerebellar and posterior cerebral arteries widely patent bilaterally. MRA NECK FINDINGS AORTIC ARCH: Visualized aortic arch within normal limits for caliber with standard branch pattern. No visible stenosis about the origin of the great vessels. RIGHT CAROTID SYSTEM: Right common and internal carotid arteries are patent with antegrade flow. No evidence for dissection. Atheromatous irregularity about the right carotid bulb without hemodynamically significant greater than 50% stenosis. No ulceration. LEFT  CAROTID SYSTEM: Left common and internal carotid arteries are patent with antegrade flow. No evidence for dissection. Atheromatous irregularity about the left carotid bulb without hemodynamically significant greater than 50% stenosis. No ulceration. VERTEBRAL ARTERIES: Both vertebral arteries arise from the subclavian arteries. No visible proximal subclavian artery stenosis. Right vertebral artery dominant. Vertebral arteries are patent with antegrade flow. No dissection or stenosis. IMPRESSION: MRI HEAD IMPRESSION: 1. No acute intracranial abnormality. 2. 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 3. Innumerable punctate chronic micro hemorrhages involving both cerebral hemispheres. Overall pattern is nonspecific, with differential considerations including sequelae of prior cardiac bypass, chronic hypertension, cerebral amyloid angiopathy, prior trauma, or possibly vasculitis. MRA HEAD IMPRESSION: 1. Negative intracranial MRA for large vessel occlusion. No hemodynamically significant  or correctable stenosis. 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 5. 3 mm supraclinoid left ICA aneurysm. MRA NECK IMPRESSION: 1. No acute vascular abnormality within the neck. 2. Mild atheromatous irregularity about both carotid artery systems within the neck without hemodynamically significant greater than 50% stenosis. 3. Patent vertebral arteries within the neck. Right vertebral artery dominant. Electronically Signed   By: Rise Mu M.D.   On: 08/13/2022 20:43   MR ANGIO HEAD WO CONTRAST  Result Date: 08/13/2022 CLINICAL DATA:  Initial evaluation for headache, neuro deficit. EXAM: MRI HEAD WITHOUT CONTRAST MRA HEAD WITHOUT CONTRAST MRA NECK WITHOUT CONTRAST TECHNIQUE: Multiplanar, multiecho pulse sequences of the brain and surrounding structures were obtained without intravenous contrast. Angiographic images of the Circle of Willis were obtained using MRA technique without intravenous contrast. Angiographic images of the neck were obtained using MRA technique without intravenous contrast. Carotid stenosis measurements (when applicable) are obtained utilizing NASCET criteria, using the distal internal carotid diameter as the denominator. COMPARISON:  None Available. FINDINGS: MRI HEAD FINDINGS Brain: Cerebral volume within normal limits. Mild chronic microvascular ischemic disease for age. Remote lacunar infarct present at the anterior right basal ganglia. Additional small remote left cerebellar infarct. Associated chronic blood products noted about these chronic infarcts. Additional chronic lacunar infarct present at the left thalamocapsular region. No evidence for acute or subacute ischemia. Gray-white matter differentiation maintained. No acute intracranial hemorrhage. Innumerable punctate chronic micro hemorrhages noted involving both cerebral hemispheres, overall pattern nonspecific. No mass lesion, midline shift or mass  effect. No hydrocephalus or extra-axial fluid collection. Pituitary gland and suprasellar region within normal limits. Vascular: Major intracranial vascular flow voids are maintained. Skull and upper cervical spine: Cranial junction within normal limits. Bone marrow signal intensity normal. No scalp soft tissue abnormality. Sinuses/Orbits: Prior bilateral ocular lens replacement. Paranasal sinuses are largely clear. Trace right mastoid effusion noted to left of significance. Other: None. MRA HEAD FINDINGS ANTERIOR CIRCULATION: Visualized distal cervical segments of both internal carotid arteries are patent with antegrade flow. Atheromatous irregularity about the cavernous ICAs bilaterally without hemodynamically significant stenosis. 2 mm outpouching extending anteriorly from the cavernous right ICA consistent with a small aneurysm (series 5, image 77). Additional 3 mm outpouching extending posteriorly from the cavernous left ICA also suspicious for aneurysm (series 5, image 76). 3 mm outpouching extending inferiorly from the supraclinoid left ICA could reflect an additional small aneurysm (series 5, image 87). A1 segments patent bilaterally. Normal anterior communicating complex. Anterior cerebral arteries widely patent. No M1 stenosis or occlusion. 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 Distal MCA branches perfused and symmetric.  POSTERIOR CIRCULATION: Both V4 segments are patent without stenosis. Right vertebral artery dominant. Right PICA patent at its origin. Left PICA origin not seen. Basilar patent without stenosis. Superior cerebellar and posterior cerebral arteries widely patent bilaterally. MRA NECK FINDINGS AORTIC ARCH: Visualized aortic arch within normal limits for caliber with standard branch pattern. No visible stenosis about the origin of the great vessels. RIGHT CAROTID SYSTEM: Right common and internal carotid arteries are patent with antegrade flow. No evidence for  dissection. Atheromatous irregularity about the right carotid bulb without hemodynamically significant greater than 50% stenosis. No ulceration. LEFT CAROTID SYSTEM: Left common and internal carotid arteries are patent with antegrade flow. No evidence for dissection. Atheromatous irregularity about the left carotid bulb without hemodynamically significant greater than 50% stenosis. No ulceration. VERTEBRAL ARTERIES: Both vertebral arteries arise from the subclavian arteries. No visible proximal subclavian artery stenosis. Right vertebral artery dominant. Vertebral arteries are patent with antegrade flow. No dissection or stenosis. IMPRESSION: MRI HEAD IMPRESSION: 1. No acute intracranial abnormality. 2. 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 3. Innumerable punctate chronic micro hemorrhages involving both cerebral hemispheres. Overall pattern is nonspecific, with differential considerations including sequelae of prior cardiac bypass, chronic hypertension, cerebral amyloid angiopathy, prior trauma, or possibly vasculitis. MRA HEAD IMPRESSION: 1. Negative intracranial MRA for large vessel occlusion. No hemodynamically significant or correctable stenosis. 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 5. 3 mm supraclinoid left ICA aneurysm. MRA NECK IMPRESSION: 1. No acute vascular abnormality within the neck. 2. Mild atheromatous irregularity about both carotid artery systems within the neck without hemodynamically significant greater than 50% stenosis. 3. Patent vertebral arteries within the neck. Right vertebral artery dominant. Electronically Signed   By: Rise Mu M.D.   On: 08/13/2022 20:43   MR ANGIO NECK WO CONTRAST  Result Date: 08/13/2022 CLINICAL DATA:  Initial evaluation for headache, neuro deficit. EXAM: MRI HEAD WITHOUT CONTRAST MRA HEAD WITHOUT CONTRAST  MRA NECK WITHOUT CONTRAST TECHNIQUE: Multiplanar, multiecho pulse sequences of the brain and surrounding structures were obtained without intravenous contrast. Angiographic images of the Circle of Willis were obtained using MRA technique without intravenous contrast. Angiographic images of the neck were obtained using MRA technique without intravenous contrast. Carotid stenosis measurements (when applicable) are obtained utilizing NASCET criteria, using the distal internal carotid diameter as the denominator. COMPARISON:  None Available. FINDINGS: MRI HEAD FINDINGS Brain: Cerebral volume within normal limits. Mild chronic microvascular ischemic disease for age. Remote lacunar infarct present at the anterior right basal ganglia. Additional small remote left cerebellar infarct. Associated chronic blood products noted about these chronic infarcts. Additional chronic lacunar infarct present at the left thalamocapsular region. No evidence for acute or subacute ischemia. Gray-white matter differentiation maintained. No acute intracranial hemorrhage. Innumerable punctate chronic micro hemorrhages noted involving both cerebral hemispheres, overall pattern nonspecific. No mass lesion, midline shift or mass effect. No hydrocephalus or extra-axial fluid collection. Pituitary gland and suprasellar region within normal limits. Vascular: Major intracranial vascular flow voids are maintained. Skull and upper cervical spine: Cranial junction within normal limits. Bone marrow signal intensity normal. No scalp soft tissue abnormality. Sinuses/Orbits: Prior bilateral ocular lens replacement. Paranasal sinuses are largely clear. Trace right mastoid effusion noted to left of significance. Other: None. MRA HEAD FINDINGS ANTERIOR CIRCULATION: Visualized distal cervical segments of both internal carotid arteries are patent with antegrade flow. Atheromatous irregularity about the cavernous ICAs bilaterally  without hemodynamically  significant stenosis. 2 mm outpouching extending anteriorly from the cavernous right ICA consistent with a small aneurysm (series 5, image 77). Additional 3 mm outpouching extending posteriorly from the cavernous left ICA also suspicious for aneurysm (series 5, image 76). 3 mm outpouching extending inferiorly from the supraclinoid left ICA could reflect an additional small aneurysm (series 5, image 87). A1 segments patent bilaterally. Normal anterior communicating complex. Anterior cerebral arteries widely patent. No M1 stenosis or occlusion. 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 Distal MCA branches perfused and symmetric. POSTERIOR CIRCULATION: Both V4 segments are patent without stenosis. Right vertebral artery dominant. Right PICA patent at its origin. Left PICA origin not seen. Basilar patent without stenosis. Superior cerebellar and posterior cerebral arteries widely patent bilaterally. MRA NECK FINDINGS AORTIC ARCH: Visualized aortic arch within normal limits for caliber with standard branch pattern. No visible stenosis about the origin of the great vessels. RIGHT CAROTID SYSTEM: Right common and internal carotid arteries are patent with antegrade flow. No evidence for dissection. Atheromatous irregularity about the right carotid bulb without hemodynamically significant greater than 50% stenosis. No ulceration. LEFT CAROTID SYSTEM: Left common and internal carotid arteries are patent with antegrade flow. No evidence for dissection. Atheromatous irregularity about the left carotid bulb without hemodynamically significant greater than 50% stenosis. No ulceration. VERTEBRAL ARTERIES: Both vertebral arteries arise from the subclavian arteries. No visible proximal subclavian artery stenosis. Right vertebral artery dominant. Vertebral arteries are patent with antegrade flow. No dissection or stenosis. IMPRESSION: MRI HEAD IMPRESSION: 1. No acute intracranial abnormality. 2. Chronic  ischemic infarcts involving the right basal ganglia, left thalamocapsular region, and left cerebellum. 3. Innumerable punctate chronic micro hemorrhages involving both cerebral hemispheres. Overall pattern is nonspecific, with differential considerations including sequelae of prior cardiac bypass, chronic hypertension, cerebral amyloid angiopathy, prior trauma, or possibly vasculitis. MRA HEAD IMPRESSION: 1. Negative intracranial MRA for large vessel occlusion. No hemodynamically significant or correctable stenosis. 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 5. 3 mm supraclinoid left ICA aneurysm. MRA NECK IMPRESSION: 1. No acute vascular abnormality within the neck. 2. Mild atheromatous irregularity about both carotid artery systems within the neck without hemodynamically significant greater than 50% stenosis. 3. Patent vertebral arteries within the neck. Right vertebral artery dominant. Electronically Signed   By: Rise Mu M.D.   On: 08/13/2022 20:43    Pending Labs Unresulted Labs (From admission, onward)     Start     Ordered   08/14/22 0500  CBC with Differential/Platelet  Daily,   R     Question:  Specimen collection method  Answer:  Lab=Lab collect   08/13/22 1553   08/14/22 0500  Brain natriuretic peptide  Daily,   R     Question:  Specimen collection method  Answer:  Lab=Lab collect   08/13/22 1553   08/14/22 0500  Magnesium  Daily,   R     Question:  Specimen collection method  Answer:  Lab=Lab collect   08/13/22 1553   08/14/22 0500  C-reactive protein  Daily,   R     Question:  Specimen collection method  Answer:  Lab=Lab collect   08/13/22 1553   08/14/22 0500  Comprehensive metabolic panel  Daily,   R     Question:  Specimen collection method  Answer:  Lab=Lab collect   08/13/22 1553   08/14/22 0500  Lipid panel  Tomorrow morning,   R        08/13/22 1613   08/13/22 1614  Hemoglobin A1c  Add-on,    AD        08/13/22 1613   08/13/22 1553  TSH  Once,   R        08/13/22 1553            Vitals/Pain Today's Vitals   08/13/22 1815 08/13/22 1830 08/13/22 1835 08/13/22 2030  BP: 137/83 (!) 137/54  139/71  Pulse: (!) 109 (!) 101  100  Resp: (!) 21 14  (!) 25  Temp:   98.3 F (36.8 C)   TempSrc:   Oral   SpO2: 100% 100%  100%  Weight:      Height:      PainSc:        Isolation Precautions No active isolations  Medications Medications  methylPREDNISolone sodium succinate (SOLU-MEDROL) 1,000 mg in sodium chloride 0.9 % 50 mL IVPB (1,000 mg Intravenous New Bag/Given 08/13/22 2128)  lactated ringers infusion ( Intravenous New Bag/Given 08/13/22 2126)  aspirin chewable tablet 81 mg (81 mg Oral Not Given 08/13/22 1818)  doxazosin (CARDURA) tablet 2 mg (2 mg Oral Given 08/13/22 2043)  pantoprazole (PROTONIX) EC tablet 40 mg (has no administration in time range)  mirtazapine (REMERON) tablet 15 mg (15 mg Oral Given 08/13/22 2123)  rosuvastatin (CRESTOR) tablet 20 mg (20 mg Oral Given 08/13/22 1818)  LORazepam (ATIVAN) injection 1 mg (has no administration in time range)  heparin injection 5,000 Units (5,000 Units Subcutaneous Given 08/13/22 2124)  acetaminophen (TYLENOL) tablet 650 mg (has no administration in time range)    Or  acetaminophen (TYLENOL) suppository 650 mg (has no administration in time range)  docusate sodium (COLACE) capsule 100 mg (100 mg Oral Given 08/13/22 2124)  ondansetron (ZOFRAN) tablet 4 mg (has no administration in time range)    Or  ondansetron (ZOFRAN) injection 4 mg (has no administration in time range)  insulin aspart (novoLOG) injection 0-9 Units ( Subcutaneous Not Given 08/13/22 1825)  insulin aspart (novoLOG) injection 0-5 Units ( Subcutaneous Not Given 08/13/22 2135)  metoprolol tartrate (LOPRESSOR) tablet 50 mg (50 mg Oral Given 08/13/22 1815)    Mobility walks     Focused Assessments Neuro Assessment Handoff:  Swallow screen pass? N/A Cardiac Rhythm:  Normal sinus rhythm       Neuro Assessment: Within Defined Limits Neuro Checks:      Has TPA been given? No If patient is a Neuro Trauma and patient is going to OR before floor call report to 4N Charge nurse: [phone] or [phone]   R Recommendations: See Admitting Provider Note  Report given to:   Additional Notes: A+Ox4, independent

## 2022-08-13 NOTE — Telephone Encounter (Signed)
Patient called to follow up on script provider called into pharmacy regarding issue with swelling behind Left eye. Pharmacy hasn't received script as of yet.   Please see previous messages in thread.

## 2022-08-13 NOTE — Telephone Encounter (Signed)
Also faxed requested CRP results with a confirmation time stamp of 08-13-2022 11:07. Please see previous messages in thread regarding swelling behind Left eye.

## 2022-08-13 NOTE — ED Provider Notes (Addendum)
[facility] AT [facility] Provider Note   CSN: [id number] Arrival date & time: 08/13/22  1301     History  Chief Complaint  Patient presents with   Temporal Arteritis    Donald Rios is a 82 y.o. male.  Patient is a 82 year old male with a history of hypertension, hyperlipidemia and coronary artery disease status post remote bypass surgery who comes in with vision loss.  He states he noticed some vision loss starting on Thursday.  He states he went to sleep and when he woke up he noticed that his vision was hazy.  He went to an ophthalmologist today and was noted to have swelling around his optic disks.  It was also noted that his sed rate was elevated at 51.  He was sent here for concerns of possible temporal arteritis.  He has noted some recent achiness in his left jaw with chewing.  He denies any headaches.  No numbness to his face.  No speech deficits.  No numbness or weakness to his extremities.  No difficulty with his balance.  Also of note, he recently has had some GI issues with loss of appetite and lower hemoglobin.  He is scheduled to have a CT scan of his abdomen pelvis and possible GI follow-up following that.  He is not on anticoagulants.  He does not have a known history of atrial fibrillation.       Home Medications Prior to Admission medications   Medication Sig Start Date End Date Taking? Authorizing Provider  allopurinol (ZYLOPRIM) 300 MG tablet TAKE 1 TABLET BY MOUTH DAILY 05/03/22   [name], MD  aspirin 81 MG tablet Take 81 mg by mouth daily.    [provider]  benazepril (LOTENSIN) 40 MG tablet TAKE 1 TABLET BY MOUTH DAILY 06/08/22   [name], MD  doxazosin (CARDURA) 2 MG tablet TAKE 1 TABLET BY MOUTH DAILY 08/04/22   [name], MD  metoprolol tartrate (LOPRESSOR) 50 MG tablet TAKE 1 AND 1/2 TABLET BY MOUTH TWO TIMES A DAY 02/17/22   [name], MD  mirtazapine (REMERON) 30 MG tablet TAKE 1  TABLET BY MOUTH EVERY NIGHT AT BEDTIME FOR APPETITE 05/31/22   [name], MD  pantoprazole (PROTONIX) 40 MG tablet Take 1 tablet (40 mg total) by mouth daily. 07/30/22   [name], MD  simvastatin (ZOCOR) 40 MG tablet TAKE ONE TABLET BY MOUTH DAILY 05/17/22   [name], MD      Allergies    Patient has no known allergies.    Review of Systems   Review of Systems  Constitutional:  Negative for chills, diaphoresis, fatigue and fever.  HENT:  Negative for congestion, rhinorrhea and sneezing.   Eyes:  Positive for visual disturbance.  Respiratory:  Negative for cough, chest tightness and shortness of breath.   Cardiovascular:  Negative for chest pain and leg swelling.  Gastrointestinal:  Negative for abdominal pain, blood in stool, diarrhea, nausea and vomiting.       Loss of appetite  Genitourinary:  Negative for difficulty urinating, flank pain, frequency and hematuria.  Musculoskeletal:  Negative for arthralgias and back pain.  Skin:  Negative for rash.  Neurological:  Negative for dizziness, speech difficulty, weakness, numbness and headaches.    Physical Exam Updated Vital Signs BP 122/72 (BP Location: Right Arm)   Pulse 89   Temp 98 F (36.7 C) (Oral)   Resp 14   Ht  5\' 8"  (1.727 m)   Wt 68 kg   SpO2 100%   BMI 22.81 kg/m  Physical Exam Constitutional:      Appearance: He is well-developed.  HENT:     Head: Normocephalic and atraumatic.  Eyes:     Extraocular Movements: Extraocular movements intact.     Pupils: Pupils are equal, round, and reactive to light.     Comments: He has some decrease in his peripheral vision circumferentially in the left eye.  Right eye visual fields are intact.  Cardiovascular:     Rate and Rhythm: Normal rate and regular rhythm.     Heart sounds: Murmur (Patient states he has a known murmur) heard.  Pulmonary:     Effort: Pulmonary effort is normal. No respiratory distress.     Breath sounds: Normal breath sounds. No  wheezing or rales.  Chest:     Chest wall: No tenderness.  Abdominal:     General: Bowel sounds are normal.     Palpations: Abdomen is soft.     Tenderness: There is no abdominal tenderness. There is no guarding or rebound.  Musculoskeletal:        General: Normal range of motion.     Cervical back: Normal range of motion and neck supple.  Lymphadenopathy:     Cervical: No cervical adenopathy.  Skin:    General: Skin is warm and dry.     Findings: No rash.  Neurological:     General: No focal deficit present.     Mental Status: He is alert and oriented to person, place, and time.     Comments: Motor 5/5 all extremities Sensation grossly intact to LT all extremities Finger to Nose intact, no pronator drift CN II-XII grossly intact       ED Results / Procedures / Treatments   Labs (all labs ordered are listed, but only abnormal results are displayed) Labs Reviewed  SEDIMENTATION RATE - Abnormal; Notable for the following components:      Result Value   Sed Rate 41 (*)    All other components within normal limits  BASIC METABOLIC PANEL - Abnormal; Notable for the following components:   Glucose, Bld 169 (*)    All other components within normal limits  CBC WITH DIFFERENTIAL/PLATELET - Abnormal; Notable for the following components:   RBC 3.13 (*)    Hemoglobin 9.5 (*)    HCT 30.7 (*)    nRBC 0.4 (*)    All other components within normal limits  TSH    EKG EKG Interpretation Date/Time:  Friday August 13 2022 14:14:06 EDT Ventricular Rate:  91 PR Interval:    QRS Duration:  148 QT Interval:  362 QTC Calculation: 446 R Axis:   -13  Text Interpretation: Atrial fibrillation Left bundle branch block Confirmed by Rolan Bucco ([id number]) on 08/13/2022 3:01:26 PM  Radiology No results found.  Procedures Procedures    Medications Ordered in ED Medications  methylPREDNISolone sodium succinate (SOLU-MEDROL) 1,000 mg in sodium chloride 0.9 % 50 mL IVPB (has no  administration in time range)    ED Course/ Medical Decision Making/ A&P                                 Medical Decision Making Risk Decision regarding hospitalization.   Patient is a 82 year old male who presents with vision loss and some vague left-sided jaw discomfort with chewing.  He is noted to  have an elevated sed rate and on chart review his CRP was also elevated yesterday.  I spoke with Dr. Amada Jupiter who recommends loading the patient with Solu-Medrol 1 g and admit to the hospitalist service.  Neurology will see the patient.  He does have new onset A-fib at a controlled rate.  However he does not have any other stroke symptoms.  His clinical picture is more consistent with temporal arteritis.  His labs are nonconcerning.  He does have some anemia.  Seems to be fairly similar to his recent values.  I spoke with Dr. Thedore Mins who will admit the patient to the hospital for further treatment.  Final Clinical Impression(s) / ED Diagnoses Final diagnoses:  Temporal arteritis (HCC)  New onset atrial fibrillation [facility])    Rx / DC Orders ED Discharge Orders     None         Rolan Bucco, MD 08/13/22 1554    Rolan Bucco, MD 08/13/22 1743

## 2022-08-13 NOTE — Consult Note (Addendum)
Cardiology Consultation   Patient ID: Donald Rios MRN: [id number]; DOB: 09-16-1940  Admit date: 08/13/2022 Date of Consult: 08/13/2022  PCP:  [name], MD   [facility] Providers Cardiologist:  Chrystie Nose, MD     Patient Profile:   Donald Rios is a 82 y.o. male with a hx of CAD status post CABG LIMA to the LAD, free IMA to the OM2 and sequential graft to the PLA and PLD in 2000, ischemic cardiomyopathy with normalization of EF, previously 45%, hyperlipidemia, type 2 diabetes, cataracts, history of GIB who is being seen 08/13/2022 for the evaluation of new onset atrial fibrillation at the request of Dr. Thedore Mins.  History of Present Illness:   Donald Rios is followed by Dr. Rennis Golden for coronary artery disease as above.  Overall has been doing very well since his procedure in 2000 with stable disease and no significant anginal complaints or limitations in ADLs.  At the time of his CABG he had EF around 45 to 50% with inferior wall motion abnormality.  In 2020 patient had nuclear stress test that showed no evidence of ischemia but there was a fixed inferior defect and EF at that time was measured to be 49%.  Subsequent echocardiogram in 2021 showed normalization of EF 60 to 65% without any regional wall motion abnormalities.  He had grade 2 diastolic dysfunction, normal RV function.  Mildly elevated PASP.  He was last seen in February 2023 and found to be doing very well, again with no complaints of chest pain, shortness of breath, or any interference in ADLs.  Currently patient has been evaluated in the emergency department for acute onset of blurry vision thought secondary to temporal arteritis.  He states that he has had recent cataract surgery and had floaters and blurriness going on.  Then on Wednesday, patient started saying he had hazy vision.  He followed up with his ophthalmologist who had concerns in referred him to his primary care provider for additional lab work.   After following up with his primary care provider he had abnormal labs with elevated ESR and CP and was sent to the ED for further evaluation.  Neurology has been consulted.  In the ER he was found to have new onset atrial fibrillation with heart rates around 90-100, thus cardiology has been consulted.  Still, there are some concerns patient might have other ischemic/hemorrhagic etiology so anticoagulation has not been initiated.  Patient evaluated at the bedside and has no significant complaints.  He attests to feeling very well and denied any complaints of chest pain, dizziness, palpitations, peripheral edema, orthopnea, syncope.  He states that he ambulates well at home and without any interference with mobility.    Hemoglobin 9.5. No significant lab abnormalities/imaging thus far.  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    History of MI (myocardial infarction)    History of nuclear stress test 08/07/2010   dipyridamole; EKG  negative for ischemia, low risk scan    Hyperlipidemia    Hyperplastic colon polyp 05/2008   Hypertension    Ischemic cardiomyopathy    EF 45%, with inferior wall motion abnormality    Myocardial infarction (HCC)    Valvular regurgitation    mitral and tricuspid (mild)    Past Surgical History:  Procedure Laterality Date   CORONARY ARTERY BYPASS GRAFT  2000   LIMA to LAD, free IMA to OM2, sequential graft to PLA & PLD   ESOPHAGOGASTRODUODENOSCOPY N/A 05/07/2013   Procedure: ESOPHAGOGASTRODUODENOSCOPY (EGD);  Surgeon: Iva Boop, MD;  Location: [facility] ENDOSCOPY;  Service: Endoscopy;  Laterality: N/A;   TRANSTHORACIC ECHOCARDIOGRAM  09/07/2010   EF 45-50%; LV systolic function mildly reduced; LA mildly dilated; mild-mod MR & mild-mod TR; aortic root sclerosis/calcification;     Inpatient Medications: Scheduled Meds:  aspirin  81 mg Oral Daily   docusate sodium  100 mg Oral BID   doxazosin  2 mg Oral Daily   heparin  5,000 Units Subcutaneous Q8H   insulin aspart  0-5 Units Subcutaneous QHS   insulin aspart  0-9 Units Subcutaneous TID WC   metoprolol tartrate  50 mg Oral BID   mirtazapine  15 mg Oral QHS   [START ON 08/14/2022] pantoprazole  40 mg Oral Daily   rosuvastatin  20 mg Oral Daily   Continuous Infusions:  lactated ringers     methylPREDNISolone (SOLU-MEDROL) injection     PRN Meds: acetaminophen **OR** acetaminophen, LORazepam, ondansetron **OR** ondansetron (ZOFRAN) IV  Allergies:   No Known Allergies  Social History:   Social History   Socioeconomic History   Marital status: Married    Spouse name: Not on file   Number of children: 1   Years of education: Not on file   Highest education level: Not on file  Occupational History   Not on file  Tobacco Use   Smoking status: Former    Current packs/day: 0.00    Types: Cigarettes    Quit date: 11/04/1985    Years since quitting: 36.7   Smokeless tobacco: Never  Substance and Sexual Activity   Alcohol use: Yes    Alcohol/week: 2.0 - 3.0 standard drinks of alcohol    Types: 2 - 3 Cans of beer per week   Drug use: No   Sexual activity: Not on file  Other Topics Concern   Not on file  Social History Narrative   Not on file   Social Determinants of Health   Financial Resource Strain: Low Risk  (07/22/2022)   Overall Financial Resource Strain (CARDIA)    Difficulty of Paying Living Expenses: Not hard at all  Food Insecurity: No Food Insecurity (07/22/2022)   Hunger Vital Sign    Worried About Running Out of Food in the Last Year:  Never true    Ran Out of Food in the Last Year: Never true  Transportation Needs: No Transportation Needs (07/22/2022)   PRAPARE - Administrator, Civil Service (Medical): No    Lack of Transportation (Non-Medical): No  Physical Activity: Sufficiently Active (07/10/2021)   Exercise Vital Sign    Days of Exercise per Week: 5 days    Minutes of Exercise per Session: 30 min  Stress: No Stress Concern Present (07/22/2022)   [employer] of Occupational Health - Occupational Stress Questionnaire    Feeling of Stress : Not at all  Social Connections: Socially Integrated (07/22/2022)   Social Connection and Isolation Panel [NHANES]  Frequency of Communication with Friends and Family: More than three times a week    Frequency of Social Gatherings with Friends and Family: More than three times a week    Attends Religious Services: More than 4 times per year    Active Member of [employer] or Organizations: Yes    Attends Engineer, structural: More than 4 times per year    Marital Status: Married  Catering manager Violence: Not At Risk (07/22/2022)   Humiliation, Afraid, Rape, and Kick questionnaire    Fear of Current or Ex-Partner: No    Emotionally Abused: No    Physically Abused: No    Sexually Abused: No    Family History:   Family History  Problem Relation Age of Onset   Stroke Father    Hypertension Father    Stroke Maternal Grandmother    Stroke Maternal Grandfather    Diabetes Brother    Esophageal cancer Brother    Stomach cancer Maternal Aunt    Colon cancer Neg Hx    Rectal cancer Neg Hx      ROS:  Please see the history of present illness.  All other ROS reviewed and negative.     Physical Exam/Data:   Vitals:   08/13/22 1314 08/13/22 1415 08/13/22 1417 08/13/22 1430  BP: 116/63 123/71  122/72  Pulse: 82 85  89  Resp: 17 20  14   Temp: 98.4 F (36.9 C)  98 F (36.7 C) 98 F (36.7 C)  TempSrc: Oral  Oral Oral  SpO2: 97% 100%  100%  Weight:     68 kg  Height:    5\' 8"  (1.727 m)   No intake or output data in the 24 hours ending 08/13/22 1708    08/13/2022    2:30 PM 07/30/2022    2:14 PM 07/22/2022   12:29 PM  Last 3 Weights  Weight (lbs) 150 lb 152 lb 9.6 oz 153 lb  Weight (kg) 68.04 kg 69.219 kg 69.4 kg     Body mass index is 22.81 kg/m.  General:  Well nourished, well developed, in no acute distress HEENT: normal Neck: no JVD Vascular: No carotid bruits; Distal pulses 2+ bilaterally Cardiac: Irregularly irregular, 3/6 holosystolic murmur appreciated left sternal border Lungs:  clear to auscultation bilaterally, no wheezing, rhonchi or rales  Abd: soft, nontender, no hepatomegaly  Ext: no edema Musculoskeletal:  No deformities, BUE and BLE strength normal and equal Skin: warm and dry  Neuro:  CNs 2-12 intact, no focal abnormalities noted Psych:  Normal affect   EKG:  The EKG was personally reviewed and demonstrates:  atrial fibrillation, heart rate 91.  Left bundle branch block.  Chronic ST-T wave inversions with slight depression inferolaterally  Telemetry:  Telemetry was personally reviewed and demonstrates: Atrial fibrillation heart rate 90-100  Relevant CV Studies: Echocardiogram 10/02/2019  1. Left ventricular ejection fraction, by estimation, is 60 to 65%. The  left ventricle has normal function. The left ventricle has no regional  wall motion abnormalities. There is mild left ventricular hypertrophy.  Left ventricular diastolic parameters  are consistent with Grade II diastolic dysfunction (pseudonormalization).   2. Right ventricular systolic function is normal. The right ventricular  size is normal. There is mildly elevated pulmonary artery systolic  pressure.   3. Left atrial size was severely dilated.   4. Right atrial size was mildly dilated.   5. The mitral valve is normal in structure. Mild mitral valve  regurgitation.   6. The aortic  valve is grossly normal. Aortic valve regurgitation is not   visualized.   Laboratory Data:  High Sensitivity Troponin:  No results for input(s): "TROPONINIHS" in the last 720 hours.   Chemistry Recent Labs  Lab 08/13/22 1340  NA 140  K 3.9  CL 106  CO2 23  GLUCOSE 169*  BUN 10  CREATININE 1.13  CALCIUM 8.9  GFRNONAA >60  ANIONGAP 11    No results for input(s): "PROT", "ALBUMIN", "AST", "ALT", "ALKPHOS", "BILITOT" in the last 168 hours. Lipids No results for input(s): "CHOL", "TRIG", "HDL", "LABVLDL", "LDLCALC", "CHOLHDL" in the last 168 hours.  Hematology Recent Labs  Lab 08/12/22 1132 08/13/22 1340  WBC 6.2 7.0  RBC 3.11* 3.13*  HGB 9.7* 9.5*  HCT 29.5* 30.7*  MCV 94.9 98.1  MCH 31.2 30.4  MCHC 32.9 30.9  RDW 14.3 15.4  PLT 228 274   Thyroid No results for input(s): "TSH", "FREET4" in the last 168 hours.  BNPNo results for input(s): "BNP", "PROBNP" in the last 168 hours.  DDimer No results for input(s): "DDIMER" in the last 168 hours.   Radiology/Studies:  No results found.   Assessment and Plan:   New onset atrial fibrillation Currently decently rate controlled with heart rates 90-100.  Anticoagulation at this time has been deferred until bleed has been ruled out.  Start IV heparin when cleared by neurology.  Patient overall stable with no symptoms.  Euvolemic.  Will plan for 3 weeks anticoagulation with cardioversion if patient does not spontaneously convert otherwise. Change to Lopressor 50 mg twice daily  Echocardiogram pending TSH pending  CAD status post CABG LIMA to the LAD, free IMA to the OM2 and sequential graft to the PLA and PLD in 2000 Ischemic cardiomyopathy with normalization of EF NM study in 2020 showed no ischemia, prior infarct noted. Echocardiogram in 2021 shows normalized EF 65% G2DD up from 45%.  CAD overall very stable disease without any complaints.  Denies any chest pain or shortness of breath.  Continue aspirin, statin, beta-blocker.  Repeat echo as above.  Murmur 3/6 holosystolic murmur  at left sternal border appreciated on exam.  Follow-up with echo results.  Hyperlipidemia LDL 46 3 months ago.  Continue rosuvastatin 20 mg.  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 The patient's score is based upon: CHF History: 0 HTN History: 1 Diabetes History: 1 Stroke History: 0 Vascular Disease History: 1 Age Score: 2 Gender Score: 0     For questions or updates, please contact [facility] Please consult [email or web address] for contact info under    Signed, Abagail Kitchens, PA-C  08/13/2022 5:08 PM   ATTENDING ATTESTATION:  After conducting a review of all available clinical information with the care team, interviewing the patient, and performing a physical exam, I agree with the findings and plan described in this note.   GEN: No acute distress.   HEENT:  MMM, no JVD, no scleral icterus Cardiac: Irregular RRR, 2/6 short systolic murmur, no rubs, or gallops.  Respiratory: Clear to auscultation bilaterally. GI: Soft, nontender, non-distended  MS: No edema; No deformity. Neuro:  Nonfocal; reports left monocular vision deficit  Vasc:  +2 radial pulses  The patient is a 82 year old male with a history of CABG in 2000, ischemic cardiomyopathy with normalization of ejection fraction, hyperlipidemia, type 2 diabetes, and cataracts who  presented today with left monocular visual deficits concerning for temporal arteritis.  An EKG was performed which demonstrated incidental atrial fibrillation which is new.  The patient's rate is relatively controlled.  Agree with Lopressor 50 mg twice daily.  Would hold on anticoagulation until need for temporal artery biopsy is clarified.  If patient ends up in the hospital and can get a sufficient number of doses of anticoagulant in we could consider a TEE cardioversion on Monday.   Otherwise we can cover with heparin when appropriate.  Alverda Skeans, MD Pager [phone]

## 2022-08-13 NOTE — Progress Notes (Signed)
Received heparin consult for atrial fibrillation. Discussed with Dr. Derry Lory - CT with multiple microhemorrhages and ruling out amyloid processes. Will hold off on starting heparin tonight and re-assess over the next 24 hours.  Eldridge Scot, PharmD Clinical Pharmacist 08/13/2022, 9:43 PM

## 2022-08-13 NOTE — Telephone Encounter (Signed)
Patient called back; has a swollen optic nerve. Please see previous messages in thread.

## 2022-08-13 NOTE — ED Triage Notes (Signed)
Pt referred to ED from Ophthalmologist for temporal arteritis. Pt states L eye has been having increasingly blurry vision.

## 2022-08-13 NOTE — ED Provider Triage Note (Signed)
Emergency Medicine Provider Triage Evaluation Note  Donald Rios , a 82 y.o. male  was evaluated in triage.  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  Patient was at the his ophthalmology office today which is [facility] ophthalmology which he was told his ESR was 51 and CRP was 56.3.  Patient's ophthalmologist noted that patient does have disc edema in his left eye.  Patient does have visual field defect on his left eye on the nasal aspect in which he cannot see and that patient has to look laterally as he can see through a tiny hole.  Patient has any eye pain or pain with extraocular movements recent illnesses.  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  Review of Systems  Positive: See HPI Negative: See HPI  Physical Exam  BP 116/63   Pulse 82   Temp 98.4 F (36.9 C) (Oral)   Resp 17   SpO2 97%  Gen:   Awake, no distress   Resp:  Normal effort  MSK:   Moves extremities without difficulty  Other:  Left eye: Visual field defect in the nasal aspect, only able to see on the lateral side of his left eye, no temporal artery tenderness to palpation, jaw claudication  Medical Decision Making  Medically screening exam initiated at 1:29 PM.  Appropriate orders placed.  [name] was informed that the remainder of the evaluation will be completed by another provider, this initial triage assessment does not replace that evaluation, and the importance of remaining in the ED until their evaluation is complete.  Workup initiated, upon reviewing patient's papers from his ophthalmologist it appears ESR is elevated and that he does have visual field defects concerning for GCA.  Charge nurse was notified.  Patient stable at this time.   Jacob, Cicero, PA-C 08/13/22 1332

## 2022-08-14 ENCOUNTER — Inpatient Hospital Stay (HOSPITAL_COMMUNITY): Payer: Medicare Other

## 2022-08-14 DIAGNOSIS — M316 Other giant cell arteritis: Secondary | ICD-10-CM

## 2022-08-14 DIAGNOSIS — I5031 Acute diastolic (congestive) heart failure: Secondary | ICD-10-CM | POA: Diagnosis not present

## 2022-08-14 LAB — SEDIMENTATION RATE: Sed Rate: 50 mm/hr — ABNORMAL HIGH (ref 0–16)

## 2022-08-14 LAB — TSH: TSH: 1.145 u[IU]/mL (ref 0.350–4.500)

## 2022-08-14 LAB — GLUCOSE, CAPILLARY
Glucose-Capillary: 187 mg/dL — ABNORMAL HIGH (ref 70–99)
Glucose-Capillary: 185 mg/dL — ABNORMAL HIGH (ref 70–99)
Glucose-Capillary: 257 mg/dL — ABNORMAL HIGH (ref 70–99)
Glucose-Capillary: 329 mg/dL — ABNORMAL HIGH (ref 70–99)

## 2022-08-14 LAB — HEMOGLOBIN A1C
Hgb A1c MFr Bld: 5.8 % — ABNORMAL HIGH (ref 4.8–5.6)
Mean Plasma Glucose: 119.76 mg/dL

## 2022-08-14 LAB — HEPARIN LEVEL (UNFRACTIONATED): Heparin Unfractionated: 0.13 IU/mL — ABNORMAL LOW (ref 0.30–0.70)

## 2022-08-14 MED ORDER — HEPARIN (PORCINE) 25000 UT/250ML-% IV SOLN
1050.0000 [IU]/h | INTRAVENOUS | Status: DC
  Filled 2022-08-14: qty 250

## 2022-08-14 MED ORDER — DIGOXIN 0.25 MG/ML IJ SOLN
0.2500 mg | Freq: Four times a day (QID) | INTRAMUSCULAR | Status: AC
  Administered 2022-08-14 (×2): 0.25 mg via INTRAVENOUS
  Filled 2022-08-14 (×2): qty 1

## 2022-08-14 MED ORDER — METOPROLOL TARTRATE 50 MG PO TABS
75.0000 mg | ORAL_TABLET | Freq: Four times a day (QID) | ORAL | Status: DC
  Administered 2022-08-14 – 2022-08-15 (×5): 75 mg via ORAL
  Filled 2022-08-14 (×6): qty 1

## 2022-08-14 MED ORDER — HEPARIN (PORCINE) 25000 UT/250ML-% IV SOLN
750.0000 [IU]/h | INTRAVENOUS | Status: DC
  Administered 2022-08-14: 750 [IU]/h via INTRAVENOUS
  Filled 2022-08-14: qty 250

## 2022-08-14 MED ORDER — FUROSEMIDE 10 MG/ML IJ SOLN
20.0000 mg | Freq: Once | INTRAMUSCULAR | Status: AC
  Administered 2022-08-14: 20 mg via INTRAVENOUS
  Filled 2022-08-14: qty 2

## 2022-08-14 MED ORDER — PERFLUTREN LIPID MICROSPHERE
1.0000 mL | INTRAVENOUS | Status: AC | PRN
  Administered 2022-08-14: 3 mL via INTRAVENOUS

## 2022-08-14 MED ORDER — METOPROLOL TARTRATE 50 MG PO TABS
50.0000 mg | ORAL_TABLET | Freq: Four times a day (QID) | ORAL | Status: DC
  Administered 2022-08-14: 50 mg via ORAL
  Filled 2022-08-14: qty 1

## 2022-08-14 MED ORDER — DILTIAZEM HCL 25 MG/5ML IV SOLN: 10.0000 mg | Freq: Four times a day (QID) | INTRAVENOUS | Status: DC | PRN

## 2022-08-14 MED ORDER — SODIUM CHLORIDE 0.9 % IV SOLN: INTRAVENOUS | Status: DC | PRN

## 2022-08-14 NOTE — Plan of Care (Signed)
   Progress Note  Patient Name: Donald Rios Date of Encounter: 08/14/2022  Primary Cardiologist: Chrystie Nose, MD  Patient is not in the room when I went to see him. Telemetry reviewed, in Afib with RVR with HR 110s. Can uptitrate Metoprolol tartarate 50 mg from Q12h to Q6h and continue systemic AC with heparin drip if no CI. Treat underlying pain. Echo today showed LVEF 45-50%. Due to CDM, Afib with RVR and BNP elevation, will benefit from spot dosing with IV Lasix.  Signed, Marjo Bicker, MD  08/14/2022, 1:02 PM

## 2022-08-14 NOTE — Consult Note (Signed)
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 He does not need a biopsy to confirm this, as his duplex findings are highly specific (~99%). Recommend proceeding with treatment of TA as directed by his IM / Neurology teams. Please call for questions.   CHIEF COMPLAINT: possible temporal arteritis  HISTORY OF PRESENT ILLNESS: Donald Rios is a 82 y.o. male admitted to the internal medicine service for treatment of possible temporal arteritis.  The patient has a fairly classic history of the same.  He reports progressive visual loss over the past several days associated with jaw claudication.  He does not have a headache or tenderness about his arteries.  Workup was initiated and revealed significantly elevated inflammatory markers.  Duplex of the temporal arteries was ordered.  Vascular consultation was requested.  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    History of MI (myocardial infarction)    History of nuclear stress test 08/07/2010   dipyridamole; EKG negative for ischemia, low risk scan    Hyperlipidemia    Hyperplastic colon polyp 05/2008   Hypertension    Ischemic cardiomyopathy    EF 45%, with inferior wall motion abnormality    Myocardial infarction (HCC)    Valvular regurgitation    mitral and tricuspid (mild)    Past Surgical History:  Procedure Laterality Date   CORONARY ARTERY BYPASS GRAFT  2000   LIMA to LAD, free IMA to OM2, sequential  graft to PLA & PLD   ESOPHAGOGASTRODUODENOSCOPY N/A 05/07/2013   Procedure: ESOPHAGOGASTRODUODENOSCOPY (EGD);  Surgeon: Iva Boop, MD;  Location: [facility] ENDOSCOPY;  Service: Endoscopy;  Laterality: N/A;   TRANSTHORACIC ECHOCARDIOGRAM  09/07/2010   EF 45-50%; LV systolic function mildly reduced; LA mildly dilated; mild-mod MR & mild-mod TR; aortic root sclerosis/calcification;     Family History  Problem Relation Age of Onset   Stroke Father    Hypertension Father    Stroke Maternal Grandmother    Stroke Maternal Grandfather    Diabetes Brother    Esophageal cancer Brother    Stomach cancer Maternal Aunt    Colon cancer Neg Hx    Rectal cancer Neg Hx     Social History   Socioeconomic History   Marital status: Married    Spouse name: Not on file   Number of children: 1   Years of education: Not on file   Highest education level: Not on file  Occupational History   Not on file  Tobacco Use   Smoking status: Former    Current packs/day: 0.00    Types: Cigarettes    Quit date: 11/04/1985    Years since quitting: 36.8   Smokeless tobacco: Never  Substance and Sexual Activity   Alcohol use: Yes    Alcohol/week: 2.0 - 3.0 standard drinks of alcohol    Types: 2 - 3 Cans of beer per week  Drug use: No   Sexual activity: Not on file  Other Topics Concern   Not on file  Social History Narrative   Not on file   Social Determinants of Health   Financial Resource Strain: Low Risk  (07/22/2022)   Overall Financial Resource Strain (CARDIA)    Difficulty of Paying Living Expenses: Not hard at all  Food Insecurity: No Food Insecurity (08/13/2022)   Hunger Vital Sign    Worried About Running Out of Food in the Last Year: Never true    Ran Out of Food in the Last Year: Never true  Transportation Needs: No Transportation Needs (08/13/2022)   PRAPARE - Administrator, Civil Service (Medical): No    Lack of Transportation (Non-Medical): No  Physical Activity: Sufficiently  Active (07/10/2021)   Exercise Vital Sign    Days of Exercise per Week: 5 days    Minutes of Exercise per Session: 30 min  Stress: No Stress Concern Present (07/22/2022)   [employer] of Occupational Health - Occupational Stress Questionnaire    Feeling of Stress : Not at all  Social Connections: Socially Integrated (07/22/2022)   Social Connection and Isolation Panel [NHANES]    Frequency of Communication with Friends and Family: More than three times a week    Frequency of Social Gatherings with Friends and Family: More than three times a week    Attends Religious Services: More than 4 times per year    Active Member of [employer] or Organizations: Yes    Attends Engineer, structural: More than 4 times per year    Marital Status: Married  Catering manager Violence: Not At Risk (08/13/2022)   Humiliation, Afraid, Rape, and Kick questionnaire    Fear of Current or Ex-Partner: No    Emotionally Abused: No    Physically Abused: No    Sexually Abused: No    No Known Allergies  Current Facility-Administered Medications  Medication Dose Route Frequency Provider Last Rate Last Admin   0.9 %  sodium chloride infusion   Intravenous PRN Leroy Sea, MD 10 mL/hr at 08/14/22 1233 New Bag at 08/14/22 1233   acetaminophen (TYLENOL) tablet 650 mg  650 mg Oral Q6H PRN Leroy Sea, MD       Or   acetaminophen (TYLENOL) suppository 650 mg  650 mg Rectal Q6H PRN Leroy Sea, MD       aspirin chewable tablet 81 mg  81 mg Oral Daily Susa Raring K, MD   81 mg at 08/14/22 1059   digoxin (LANOXIN) 0.25 MG/ML injection 0.25 mg  0.25 mg Intravenous Q6H Susa Raring K, MD   0.25 mg at 08/14/22 1050   docusate sodium (COLACE) capsule 100 mg  100 mg Oral BID Leroy Sea, MD   100 mg at 08/13/22 2124   doxazosin (CARDURA) tablet 2 mg  2 mg Oral Daily Susa Raring K, MD   2 mg at 08/14/22 1059   heparin ADULT infusion 100 units/mL (25000 units/263mL)  750 Units/hr  Intravenous Continuous Susa Raring K, MD 7.5 mL/hr at 08/14/22 1056 750 Units/hr at 08/14/22 1056   insulin aspart (novoLOG) injection 0-5 Units  0-5 Units Subcutaneous QHS Leroy Sea, MD       insulin aspart (novoLOG) injection 0-9 Units  0-9 Units Subcutaneous TID WC Leroy Sea, MD   2 Units at 08/14/22 1221   lactated ringers infusion   Intravenous Continuous Leroy Sea, MD 75 mL/hr at 08/14/22  1241 New Bag at 08/14/22 1241   LORazepam (ATIVAN) injection 1 mg  1 mg Intravenous Once PRN Leroy Sea, MD       methylPREDNISolone sodium succinate (SOLU-MEDROL) 1,000 mg in sodium chloride 0.9 % 50 mL IVPB  1,000 mg Intravenous Daily Rolan Bucco, MD 66 mL/hr at 08/14/22 1235 1,000 mg at 08/14/22 1235   metoprolol tartrate (LOPRESSOR) tablet 50 mg  50 mg Oral BID Yvonna Alanis L, PA-C   50 mg at 08/14/22 1100   mirtazapine (REMERON) tablet 15 mg  15 mg Oral QHS Leroy Sea, MD   15 mg at 08/13/22 2123   ondansetron (ZOFRAN) tablet 4 mg  4 mg Oral Q6H PRN Leroy Sea, MD       Or   ondansetron [facility]) injection 4 mg  4 mg Intravenous Q6H PRN Leroy Sea, MD       pantoprazole (PROTONIX) EC tablet 40 mg  40 mg Oral Daily Leroy Sea, MD   40 mg at 08/14/22 1101   rosuvastatin (CRESTOR) tablet 20 mg  20 mg Oral Daily Susa Raring K, MD   20 mg at 08/14/22 1101    PHYSICAL EXAM Vitals:   08/14/22 0752 08/14/22 1032 08/14/22 1059 08/14/22 1100  BP: (!) 124/59 134/67 139/77   Pulse: (!) 110 (!) 130  (!) 115  Resp: 17 19    Temp: 98.1 F (36.7 C) 98 F (36.7 C)    TempSrc: Oral Oral    SpO2: 100% 100%    Weight:      Height:       Elderly man in no distress Irregular tachycardia Unlabored No palpable temporal pulses.  No temp artery tenderness   PERTINENT LABORATORY AND RADIOLOGIC DATA  Most recent CBC    Latest Ref Rng & Units 08/14/2022    3:17 AM 08/13/2022    1:40 PM 08/12/2022   11:32 AM  CBC  WBC 4.0 - 10.5 K/uL 4.1  7.0   6.2   Hemoglobin 13.0 - 17.0 g/dL 9.4  9.5  9.7   Hematocrit 39.0 - 52.0 % 29.1  30.7  29.5   Platelets 150 - 400 K/uL 183  274  228      Most recent CMP    Latest Ref Rng & Units 08/14/2022    3:17 AM 08/13/2022    1:40 PM 07/30/2022    2:38 PM  CMP  Glucose 70 - 99 mg/dL 952  841  324   BUN 8 - 23 mg/dL 13  10  16    Creatinine 0.61 - 1.24 mg/dL 4.01  0.27  2.53   Sodium 135 - 145 mmol/L 137  140  141   Potassium 3.5 - 5.1 mmol/L 4.3  3.9  4.5   Chloride 98 - 111 mmol/L 104  106  105   CO2 22 - 32 mmol/L 22  23  26    Calcium 8.9 - 10.3 mg/dL 8.7  8.9  8.9   Total Protein 6.5 - 8.1 g/dL 5.6   5.7   Total Bilirubin 0.3 - 1.2 mg/dL 1.2   0.7   Alkaline Phos 38 - 126 U/L 50     AST 15 - 41 U/L 14   14   ALT 0 - 44 U/L 14   10     Renal function Estimated Creatinine Clearance: 48.9 mL/min (by C-G formula based on SCr of 1.12 mg/dL).  Hgb A1c MFr Bld (%)  Date Value  08/14/2022 5.8 (H)  LDL (calc)  Date Value Ref Range Status  08/17/2012 58 <100 mg/dL Final    Comment:    LDL-C is inaccurate if patient is nonfasting.   Reference Range: ---------------- Optimal:            <100 Near/Above Optimal: 100-129 Borderline High:    130-159 High:               160-189 Very High:          >=190       LDL Cholesterol (Calc)  Date Value Ref Range Status  05/14/2022 46 mg/dL (calc) Final    Comment:    Reference range: <100 . Desirable range <100 mg/dL for primary prevention;   <70 mg/dL for patients with CHD or diabetic patients  with > or = 2 CHD risk factors. Marland Kitchen LDL-C is now calculated using the Martin-Hopkins  calculation, which is a validated novel method providing  better accuracy than the Friedewald equation in the  estimation of LDL-C.  Horald Pollen et al. Lenox Ahr. 5621;308(65): 2061-2068  (http://education.[employer].com/faq/FAQ164)    LDL Cholesterol  Date Value Ref Range Status  08/14/2022 52 0 - 99 mg/dL Final    Comment:           Total  Cholesterol/HDL:CHD Risk Coronary Heart Disease Risk Table                     Men   Women  1/2 Average Risk   3.4   3.3  Average Risk       5.0   4.4  2 X Average Risk   9.6   7.1  3 X Average Risk  23.4   11.0        Use the calculated Patient Ratio above and the CHD Risk Table to determine the patient's CHD Risk.        ATP III CLASSIFICATION (LDL):  <100     mg/dL   Optimal  784-696  mg/dL   Near or Above                    Optimal  130-159  mg/dL   Borderline  295-284  mg/dL   High  >132     mg/dL   Very High Performed at [facility], [address] [place], [place] [address]      Vascular Imaging: No compression of bilateral temporal arteries at proximal, mid, or distal segments. Abnormal flow noted throughout. Summary:  Presence of a "halo" sign in the bilateral temporal artery suggests temporal arteritis.   Rande Brunt. Lenell Antu, MD [facility] Phone Number: [phone] 08/14/2022 12:45 PM   Total time spent on preparing this encounter including chart review, data review, collecting history, examining the patient, coordinating care for this new patient, 45 minutes.  Portions of this report may have been transcribed using voice recognition software.  Every effort has been made to ensure accuracy; however, inadvertent computerized transcription errors may still be present.

## 2022-08-14 NOTE — Evaluation (Signed)
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 Upon arrival to ED found to be in Afib with RVR. PMH significant for CAD s/p CABG, ischemic cardiomyopathy with EF 45%, DM II, dyslipidemia, gout, cataract, history of GI bleed   Clinical Impression   PTA, pt lived with wife and was independent. Upon eval, pt performing UB ADL with mod I and LB ADL with mod I, and OOB mobility with up to supervision due to elevated HR as high as 143 bpm. Pt with significantly limited L eye vision and with complaints of visual loss as well as decr depth perception.  Not undershooting/overshooting noted during oral care but will further assess. Pt with significantly decreased peripheral vision and able to see very small amount with L eye central vision. Due to elevated HR, deferred further assessment or education regarding visual compensation. Will continue to follow and recommending OP OT to optimize visual compensatory strategies.      Recommendations for follow up therapy are one component of a multi-disciplinary discharge planning process, led by the attending physician.  Recommendations may be updated based on patient status, additional functional criteria and insurance authorization.   Assistance Recommended at Discharge PRN  Patient can return home with the following Assist for transportation    Functional Status Assessment  Patient has had a recent decline in their functional status and demonstrates the ability to make significant improvements in function in a reasonable and predictable amount of time.  Equipment Recommendations  None recommended by OT    Recommendations for Other Services       Precautions / Restrictions  Precautions Precautions: Other (comment) Precaution Comments: watch HR, L eye low vision      Mobility Bed Mobility Overal bed mobility: Modified Independent                  Transfers Overall transfer level: Modified independent                 General transfer comment: supervision provided due to elevated HR      Balance Overall balance assessment: Mild deficits observed, not formally tested                                         ADL either performed or assessed with clinical judgement   ADL Overall ADL's : Needs assistance/impaired Eating/Feeding: Independent   Grooming: Supervision/safety;Standing Grooming Details (indicate cue type and reason): safety only due to elevated HR Upper Body Bathing: Modified independent;Sitting   Lower Body Bathing: Modified independent;Sit to/from stand   Upper Body Dressing : Modified independent;Sitting   Lower Body Dressing: Modified independent;Sit to/from stand   Toilet Transfer: Supervision/safety;Ambulation Toilet Transfer Details (indicate cue type and reason): safety (elevated HR)         Functional mobility during ADLs: Supervision/safety General ADL Comments: limited by elevated HR     Vision Baseline Vision/History: 0 No visual deficits Ability to See in Adequate Light: 0 Adequate Patient Visual Report: Other (comment) (very limited vision in L eye) Vision Assessment?: Yes Eye Alignment: Within Functional Limits Alignment/Gaze Preference: Within Defined Limits Tracking/Visual Pursuits:  (not tested as nursing and lab entering  room and MD had just reviewed vision exercises with pt for L eye. Will continue to assess.) Visual Fields: Impaired-to be further tested in functional context (Pt with decreasd peripheral vision L eye nasally and temporally, superiorly and inferiorly. WIth R eye closed, pt able to locate therapist's ear and cannot also see shirt and hair simultaneously. Suspect pt  seeing <2x2 square inch area) Additional Comments: no overt overshooting/undershooting with oral care at sink or reaching to grasp therapist's hands, however, pt complaining of difficulty with depth perception consistent with predominantly monocular vision.     Perception Perception Perception Tested?: No   Praxis Praxis Praxis tested?: Not tested    Pertinent Vitals/Pain Pain Assessment Pain Assessment: No/denies pain     Hand Dominance     Extremity/Trunk Assessment Upper Extremity Assessment Upper Extremity Assessment: Overall WFL for tasks assessed   Lower Extremity Assessment Lower Extremity Assessment: Defer to PT evaluation   Cervical / Trunk Assessment Cervical / Trunk Assessment: Normal   Communication Communication Communication: No difficulties   Cognition Arousal/Alertness: Awake/alert Behavior During Therapy: WFL for tasks assessed/performed Overall Cognitive Status: Within Functional Limits for tasks assessed                                 General Comments: Able to provide thorough history, has already scheduled another follow up with opthalmologist for after discharge. Following all commands with max distracting room (TV on, multiple providers present, etc)     General Comments  RN cleared to see. HR 112 at start of session and up to 143 with mobility to sink and oral care.    Exercises     Shoulder Instructions      Home Living Family/patient expects to be discharged to:: Private residence Living Arrangements: Spouse/significant other Available Help at Discharge: Family Type of Home: House Home Access: Stairs to enter Secretary/administrator of Steps: flight (enters from basement with garage and has flight of stairs to main level) Entrance Stairs-Rails: Left Home Layout:  (parks in basement/garage)     Bathroom Shower/Tub: Producer, television/film/video: Standard     Home Equipment: BSC/3in1          Prior  Functioning/Environment Prior Level of Function : Independent/Modified Independent;Driving             Mobility Comments: no AD ADLs Comments: indep in ADL and IADL        OT Problem List: Impaired vision/perception      OT Treatment/Interventions: Self-care/ADL training;Therapeutic exercise;DME and/or AE instruction;Balance training;Therapeutic activities;Visual/perceptual remediation/compensation;Patient/family education    OT Goals(Current goals can be found in the care plan section) Acute Rehab OT Goals Patient Stated Goal: get my vision back OT Goal Formulation: With patient Time For Goal Achievement: 08/28/22 Potential to Achieve Goals: Good  OT Frequency: Min 2X/week    Co-evaluation              AM-PAC OT "6 Clicks" Daily Activity     Outcome Measure Help from another person eating meals?: None Help from another person taking care of personal grooming?: A Little Help from another person toileting, which includes using toliet, bedpan, or urinal?: A Little Help from another person bathing (including washing, rinsing, drying)?: A Little Help from another person to put on and taking off regular upper body clothing?: None Help from another person to put on and taking off regular lower body clothing?: None 6 Click Score: 21  End of Session Nurse Communication: Mobility status (RN present and aware of HR)  Activity Tolerance: Treatment limited secondary to medical complications (Comment) (elevated HR) Patient left: in bed;with call bell/phone within reach;with nursing/sitter in room  OT Visit Diagnosis: Low vision, both eyes (H54.2)                Time: 1040-1059 OT Time Calculation (min): 19 min Charges:  OT General Charges $OT Visit: 1 Visit OT Evaluation $OT Eval Low Complexity: 1 Low  Tyler Deis, OTR/L [facility] Acute Rehabilitation Office: [phone]   Donald Rios 08/14/2022, 1:01 PM

## 2022-08-14 NOTE — Progress Notes (Signed)
ANTICOAGULATION CONSULT NOTE - Follow Up Consult  Pharmacy Consult for IV heparin Indication: afib  No Known Allergies  Patient Measurements: Height: 5\' 8"  (172.7 cm) Weight: 65.7 kg (144 lb 14.4 oz) IBW/kg (Calculated) : 68.4 Heparin Dosing Weight: 68.4 kg  Vital Signs: Temp: 98.1 F (36.7 C) (08/03 1643) Temp Source: Oral (08/03 1643) BP: 122/59 (08/03 1643) Pulse Rate: 93 (08/03 1643)  Labs: Recent Labs    08/12/22 1132 08/13/22 1340 08/14/22 0317 08/14/22 1724  HGB 9.7* 9.5* 9.4*  --   HCT 29.5* 30.7* 29.1*  --   PLT 228 274 183  --   HEPARINUNFRC  --   --   --  0.13*  CREATININE  --  1.13 1.12  --     Estimated Creatinine Clearance: 48.9 mL/min (by C-G formula based on SCr of 1.12 mg/dL).   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    History of MI (myocardial infarction)    History of nuclear stress test 08/07/2010   dipyridamole; EKG negative for ischemia, low risk scan    Hyperlipidemia    Hyperplastic colon polyp 05/2008   Hypertension    Ischemic cardiomyopathy    EF 45%, with inferior wall motion abnormality    Myocardial infarction (HCC)    Valvular regurgitation    mitral and tricuspid (mild)    Medications:  Infusions:   sodium chloride 10 mL/hr at 08/14/22 1233   heparin 750 Units/hr (08/14/22 1056)   methylPREDNISolone (SOLU-MEDROL) injection 1,000 mg (08/14/22 1235)    Assessment: 82 yo male with new onset afib.  Pharmacy asked to start anticoagulation with IV heparin.  Heparin  level 0.13 (subtherapeutic) on heparin 750 units/hr. No bleeding or issues with the infusion per discussion with RN.  Goal of Therapy:  Heparin level 0.3-0.7 units/ml Monitor platelets by anticoagulation protocol: Yes   Plan:  Increase IV heparin to 900 units/hr Check heparin level 8 hrs  Daily heparin level and CBC. F/u plans for DOAC soon  Loralee Pacas, PharmD, BCPS  08/14/2022 6:18 PM   Please check AMION for all [facility] Pharmacy phone numbers After 10:00 PM, call Main Pharmacy [phone]

## 2022-08-14 NOTE — Progress Notes (Signed)
Echocardiogram 2D Echocardiogram has been performed.  Lucendia Herrlich 08/14/2022, 10:17 AM

## 2022-08-14 NOTE — Evaluation (Signed)
Clinical/Bedside Swallow Evaluation Patient Details  Name: Donald Rios MRN: [id number] Date of Birth: [birth year]  Today's Date: 08/14/2022 Time: SLP Start Time (ACUTE ONLY): 1514 SLP Stop Time (ACUTE ONLY): 1538 SLP Time Calculation (min) (ACUTE ONLY): 24 min  Past Medical History:  Past Medical History:  Diagnosis Date   Acute gastric ulcer with hemorrhage 05/06/2013   Arthritis    Asthma    Atrial enlargement, left    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    History of MI (myocardial infarction)    History of nuclear stress test 08/07/2010   dipyridamole; EKG negative for ischemia, low risk scan    Hyperlipidemia    Hyperplastic colon polyp 05/2008   Hypertension    Ischemic cardiomyopathy    EF 45%, with inferior wall motion abnormality    Myocardial infarction (HCC)    Valvular regurgitation    mitral and tricuspid (mild)   Past Surgical History:  Past Surgical History:  Procedure Laterality Date   CORONARY ARTERY BYPASS GRAFT  2000   LIMA to LAD, free IMA to OM2, sequential graft to PLA & PLD   ESOPHAGOGASTRODUODENOSCOPY N/A 05/07/2013   Procedure: ESOPHAGOGASTRODUODENOSCOPY (EGD);  Surgeon: Iva Boop, MD;  Location: [facility] ENDOSCOPY;  Service: Endoscopy;  Laterality: N/A;   TRANSTHORACIC ECHOCARDIOGRAM  09/07/2010   EF 45-50%; LV systolic function mildly reduced; LA mildly dilated; mild-mod MR & mild-mod TR; aortic root sclerosis/calcification;    HPI:  Donald Rios  is a 82 y.o. 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  Neurology was consulted and I was requested to admit the patient.     Sides above history which is positive for almost complete left eye vision loss gradually progressive for the last day and a half his review of system is also positive for bilateral jaw claudication while eating food, this is also going on for 2 to 3 days, he does not have any headache, no fever chills or fatigue, no unwanted weight loss, no tingling numbness or focal weakness  ST consulted for swallow evaluation.    Assessment / Plan / Recommendation  Clinical Impression  Pt seen for clinical swallowing evaluation with normal oropharyngeal swallow noted across all consistencies.  Pt did inform SLP he has odynophagia with mastication of solids intermittently and this began earlier this week when nerve pain began/visual changes.  Pt  has consumed preferred foods that require min mastication for ease of transition/increased satiety.  Swallow function adequate across all phases of the swallow with mastication effective with soft solids and swallow timely without overt s/sx of aspiration present throughout assessment.  Recommend continue current diet with pt preferred foods.  Discussed slow progression to regular foods as pt able/pain subsides.  No ST recommended in acute setting, so ST will s/o.  Thank you for this consult. SLP Visit Diagnosis: Dysphagia, unspecified  (R13.10)    Aspiration Risk  No limitations    Diet Recommendation   Thin;Age appropriate regular (pt preferred)  Medication Administration: Whole meds with liquid    Other  Recommendations Oral Care Recommendations: Oral care BID;Patient independent with oral care    Recommendations for follow up therapy are one component of a multi-disciplinary discharge planning process, led by the attending physician.  Recommendations may be updated based on patient status, additional functional criteria and insurance authorization.  Follow up Recommendations No SLP follow up      Assistance Recommended at Discharge  prn  Functional Status Assessment Patient has had a recent decline in their functional status and demonstrates the ability to make significant improvements in function in a reasonable and predictable amount of time.  Frequency and Duration  (evaluation only)          Prognosis Prognosis for improved oropharyngeal function: Good      Swallow Study   General Date of Onset: 08/13/22 HPI: Anterrio Rios  is a 82 y.o. 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  Neurology was consulted and I was requested to admit the patient.     Sides above history which is positive for almost  complete left eye vision loss gradually progressive for the last day and a half his review of system is also positive for bilateral jaw claudication while eating food, this is also going on for 2 to 3 days, he does not have any headache, no fever chills or fatigue, no unwanted weight loss, no tingling numbness or focal weakness  ST consulted for swallow evaluation. Type of Study: Bedside Swallow Evaluation Previous Swallow Assessment: n/a Diet Prior to this Study: Regular;Thin liquids (Level 0) Temperature Spikes Noted: No Respiratory Status: Room air History of Recent Intubation: No Behavior/Cognition: Alert;Cooperative;Pleasant mood Oral Cavity Assessment: Within Functional Limits Oral Care Completed by SLP: No Oral Cavity - Dentition: Dentures, top;Adequate natural dentition Vision: Functional for self-feeding Self-Feeding Abilities: Able to feed self Patient Positioning: Upright in bed Baseline Vocal Quality: Normal Volitional Cough: Strong Volitional Swallow: Able to elicit    Oral/Motor/Sensory Function Overall Oral Motor/Sensory Function: Within functional limits   Ice Chips Ice chips: Not tested   Thin Liquid Thin Liquid: Within functional limits Presentation: Cup;Self  Fed    Nectar Thick Nectar Thick Liquid: Not tested   Honey Thick Honey Thick Liquid: Not tested   Puree Puree: Within functional limits Presentation: Self Fed   Solid     Solid: Impaired Presentation: Self Fed Oral Phase Impairments: Other (comment) (odynophagia with mastication) Oral Phase Functional Implications: Other (comment) (odynophagia with mastication)      Pat ,M.S., CCC-SLP 08/14/2022,3:57 PM

## 2022-08-14 NOTE — Progress Notes (Signed)
ANTICOAGULATION CONSULT NOTE - Initial Consult  Pharmacy Consult for IV heparin Indication: afib  No Known Allergies  Patient Measurements: Height: 5\' 8"  (172.7 cm) Weight: 65.7 kg (144 lb 14.4 oz) IBW/kg (Calculated) : 68.4 Heparin Dosing Weight: 68.4 kg  Vital Signs: Temp: 98.1 F (36.7 C) (08/03 0752) Temp Source: Oral (08/03 0752) BP: 124/59 (08/03 0752) Pulse Rate: 110 (08/03 0752)  Labs: Recent Labs    08/12/22 1132 08/13/22 1340 08/14/22 0317  HGB 9.7* 9.5* 9.4*  HCT 29.5* 30.7* 29.1*  PLT 228 274 183  CREATININE  --  1.13 1.12    Estimated Creatinine Clearance: 48.9 mL/min (by C-G formula based on SCr of 1.12 mg/dL).   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    History of MI (myocardial infarction)    History of nuclear stress test 08/07/2010   dipyridamole; EKG negative for ischemia, low risk scan    Hyperlipidemia    Hyperplastic colon polyp 05/2008   Hypertension    Ischemic cardiomyopathy    EF 45%, with inferior wall motion abnormality    Myocardial infarction [facility])    Valvular regurgitation    mitral and tricuspid (mild)    Medications:  Infusions:   heparin     lactated ringers 75 mL/hr at 08/13/22 2126   methylPREDNISolone (SOLU-MEDROL) injection Stopped (08/13/22 2247)    Assessment: 82 yo male with new onset afib.  Pharmacy asked to start anticoagulation with IV heparin.  Goal of Therapy:  Heparin level 0.3-0.7 units/ml Monitor platelets by anticoagulation protocol: Yes    Plan:  Start IV heparin without bolus at 750 units/hr Check heparin level 8 hrs after gtt starts Daily heparin level and CBC. F/u plans for DOAC soon  Jenetta Downer, [facility] Clinical Pharmacist  08/14/2022 8:40 AM   [facility] pharmacy phone numbers are listed on [email or web address]

## 2022-08-14 NOTE — Progress Notes (Signed)
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 Upon arrival to ED found to be in Afib with RVR. PMH significant for CAD s/p CABG, ischemic cardiomyopathy with EF 45%, DM II, dyslipidemia, gout, cataract, history of GI bleed  Clinical Impression  Pt was seen for mobility with observation of both his ability to see and navigate his environment as well as HR response to all mobility.  He is demonstrating awareness of limited depth perception with L eye being very restricted to see, and making accommodations with reaching to touch nearby objects.  HR did elevate from initial higher 112 to 143 with standing activity, and so per nsg recommendation returned him to bed rather than a chair.  Follow up to see how HR responds as well as pt adjusts to his environment with gait on next session.  Follow for acute PT goals as are outlined below.        If plan is discharge home, recommend the following: A little help with walking and/or transfers;Assistance with cooking/housework;Assist for transportation;Help with stairs or ramp for entrance   Can travel by private vehicle        Equipment Recommendations None recommended by PT  Recommendations for Other Services       Functional Status Assessment Patient has had a recent decline in their functional status and demonstrates the ability to make significant improvements in function in a reasonable and predictable amount of time.     Precautions / Restrictions Precautions Precautions: Other (comment) Precaution Comments: watch HR, L eye low vision Restrictions Weight Bearing Restrictions: No      Mobility  Bed Mobility Overal bed mobility: Modified Independent                   Transfers Overall transfer level: Modified independent                      Ambulation/Gait Ambulation/Gait assistance: Min guard Gait Distance (Feet): 20 Feet (10 x 2) Assistive device: None   Gait velocity: controlled, reduced Gait velocity interpretation: <1.31 ft/sec, indicative of household ambulator Pre-gait activities: standing balance General Gait Details: walking with reaching for orienting depth by touching nearby items  Stairs            Wheelchair Mobility     Tilt Bed    Modified Rankin (Stroke Patients Only)       Balance Overall balance assessment: Mild deficits observed, not formally tested                                           Pertinent Vitals/Pain Pain Assessment Pain Assessment: No/denies pain    Home Living Family/patient expects to be discharged to:: Private residence Living Arrangements: Spouse/significant other Available Help at Discharge: Family Type of Home: House Home Access: Stairs to enter Entrance Stairs-Rails: Left Entrance Stairs-Number of Steps: flight (enters from basement with garage and has flight of stairs to main level) Alternate Level Stairs-Number of Steps: 18 Home Layout: One level (parks in lower level and walks up) Home Equipment: BSC/3in1      Prior Function Prior Level of  Function : Independent/Modified Independent;Driving             Mobility Comments: no AD needed ADLs Comments: indep in ADL and IADL     Hand Dominance   Dominant Hand: Right    Extremity/Trunk Assessment   Upper Extremity Assessment Upper Extremity Assessment: Overall WFL for tasks assessed    Lower Extremity Assessment Lower Extremity Assessment: Overall WFL for tasks assessed    Cervical / Trunk Assessment Cervical / Trunk Assessment: Normal  Communication   Communication: No difficulties  Cognition Arousal/Alertness: Awake/alert Behavior During Therapy: WFL for tasks  assessed/performed Overall Cognitive Status: Within Functional Limits for tasks assessed                                 General Comments: good attention to tasks, chaotic room during eval        General Comments General comments (skin integrity, edema, etc.): Pt ranged 112 to 143 pulse with session and returned to bed as nursing beginning meds for HR and unfractionated heparin    Exercises     Assessment/Plan    PT Assessment Patient needs continued PT services  PT Problem List Decreased activity tolerance;Decreased balance;Decreased mobility;Cardiopulmonary status limiting activity       PT Treatment Interventions DME instruction;Gait training;Functional mobility training;Therapeutic activities;Therapeutic exercise;Balance training;Neuromuscular re-education;Patient/family education;Stair training    PT Goals (Current goals can be found in the Care Plan section)  Acute Rehab PT Goals Patient Stated Goal: to be independent to walk PT Goal Formulation: With patient Time For Goal Achievement: 08/21/22 Potential to Achieve Goals: Good    Frequency Min 3X/week     Co-evaluation               AM-PAC PT "6 Clicks" Mobility  Outcome Measure Help needed turning from your back to your side while in a flat bed without using bedrails?: None Help needed moving from lying on your back to sitting on the side of a flat bed without using bedrails?: A Little Help needed moving to and from a bed to a chair (including a wheelchair)?: A Little Help needed standing up from a chair using your arms (e.g., wheelchair or bedside chair)?: A Little Help needed to walk in hospital room?: A Little Help needed climbing 3-5 steps with a railing? : A Lot 6 Click Score: 18    End of Session   Activity Tolerance: Treatment limited secondary to medical complications (Comment) Patient left: in bed;with call bell/phone within reach;with nursing/sitter in room Nurse Communication:  Mobility status;Other (comment) (reported HR) PT Visit Diagnosis: Unsteadiness on feet (R26.81);Difficulty in walking, not elsewhere classified (R26.2)    Time: 1040-1059 PT Time Calculation (min) (ACUTE ONLY): 19 min   Charges:   PT Evaluation $PT Eval Moderate Complexity: 1 Mod   PT General Charges $$ ACUTE PT VISIT: 1 Visit        Ivar Drape 08/14/2022, 1:32 PM  Samul Dada, PT PhD Acute Rehab Dept. Number: [facility] [id number] and [facility] [phone]

## 2022-08-14 NOTE — Progress Notes (Signed)
   08/14/22 1032  Assess: MEWS Score  Temp 98 F (36.7 C)  BP 134/67  MAP (mmHg) 84  Pulse Rate (!) 130  ECG Heart Rate (!) 130  Resp 19  SpO2 100 %  Assess: MEWS Score  MEWS Temp 0  MEWS Systolic 0  MEWS Pulse 3  MEWS RR 0  MEWS LOC 0  MEWS Score 3  MEWS Score Color Yellow  Assess: if the MEWS score is Yellow or Red  Were vital signs accurate and taken at a resting state? Yes  Does the patient meet 2 or more of the SIRS criteria? No  MEWS guidelines implemented  Yes, yellow  Treat  MEWS Interventions Considered administering scheduled or prn medications/treatments as ordered  Take Vital Signs  Increase Vital Sign Frequency  Yellow: Q2hr x1, continue Q4hrs until patient remains green for 12hrs  Escalate  MEWS: Escalate Yellow: Discuss with charge nurse and consider notifying provider and/or RRT  Notify: Charge Nurse/RN  Name of Charge Nurse/RN Notified Shanda Bumps  rn  Provider Notification  Provider Name/Title dr Thedore Mins  Date Provider Notified 08/14/22  Time Provider Notified 1150  Method of Notification Page (secure chat)  Notification Reason Other (Comment) (change in mews color)  Provider response No new orders  Date of Provider Response 08/14/22  Time of Provider Response 1207  Assess: SIRS CRITERIA  SIRS Temperature  0  SIRS Pulse 1  SIRS Respirations  0  SIRS WBC 0  SIRS Score Sum  1

## 2022-08-14 NOTE — Progress Notes (Signed)
Subjective: Patient sitting up in bed, no family at bedside. Just returned from ECHO.  Continues to endorse left eye vision loss. However, was able to tell how many fingers were being held up.  Exam: Vitals:   08/14/22 0437 08/14/22 0752  BP:  (!) 124/59  Pulse: 95 (!) 110  Resp:  17  Temp:  98.1 F (36.7 C)  SpO2: 100% 100%   Gen: In bed, NAD Resp: non-labored breathing, no acute distress Abd: soft, nt  Neuro: Mental Status: Patient is awake, alert, oriented to person, place, month, year, and situation. Patient is able to give a clear and coherent history. No signs of aphasia or neglect Cranial Nerves: II: Decreased vision in left eye.  Able to see fingers waving and able to see how many were being held up. Can see some colors and outlines of shapes. Unable to read close up for at a distance.+ apd III,IV, VI: EOMI without pain, ptosis or diploplia.  V: Facial sensation is symmetric to temperature VII: Facial movement is symmetric.  VIII: hearing is intact to voice X: Uvula elevates symmetrically XI: Shoulder shrug is symmetric. XII: tongue is midline without atrophy or fasciculations.  Motor: Tone is normal. Bulk is normal. 5/5 strength was present in all four extremities.  Sensory: Sensation is symmetric to light touch and temperature in the arms and legs. Cerebellar: FNF and HKS are intact bilaterally  Pertinent Imaging:  [place] Temporal Artery:  (Prelim) Positive for HALO sign bilaterally  MRI Head:  No acute abnormality. Chronic infarcts R basal gnaglina, L cerebellum. Multiple punctate chronic micro-hemorrhages involving both hemispheres.   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  MRA Head:  Negative   ECHO: LVEF 45-50%, Global hypokinesis. Mild LVH. Mild to moderate TVR.   Impression: Donald Rios is a 82 y.o. male with PMH of DM2,  dyslipidemia ,CAD s/p CABG, EF 45%, who presented to the ED 8/2 with complaints of left eye vision loss, worsening since Wednesday night. 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  Given the visual involvement, favor IV Solu-Medrol for 3 days prior to starting prednisone.   Recommendations: - Solu-Medrol for 3 doses total, then prednisone 60mg  - Continue heparin for A fib    Pt seen by Neuro NP/APP and later by MD. Note/plan to be edited by MD as needed.    Lynnae January, DNP, AGACNP-BC [facility] Please use AMION for contact information & EPIC for messaging.   I have seen the patient reviewed the above note.  No significant changes in his visual symptoms.  He will need to complete a third dose of Solu-Medrol tomorrow, and then I would start prednisone 60 mg daily with plan to gradually taper down over time and follow-up with rheumatology.  I would favor Protonix for GI prophylaxis while on steroids as well.  Of note, he has a history of polymyalgia rheumatica and has previously seen a rheumatologist.  Okay with anticoagulation for atrial fibrillation, no evidence of TIA or stroke.  Neurology will follow  Ritta Slot, MD [facility] [phone]  If 7pm- 7am, please page neurology on call as listed in AMION.

## 2022-08-14 NOTE — Progress Notes (Signed)
VASCULAR LAB    Temporal artery duplex has been performed.  See CV proc for preliminary results.   , , RVT 08/14/2022, 9:39 AM

## 2022-08-14 NOTE — Progress Notes (Addendum)
PROGRESS NOTE                                                                                                                                                                                                             Patient Demographics:    Donald Rios, is a 82 y.o. male, DOB - 12-31-1940, JYN:[id number]  Outpatient Primary MD for the patient is [name], MD    LOS - 1  Admit date - 08/13/2022    Chief Complaint  Patient presents with   Temporal Arteritis       Brief Narrative (HPI from H&P)   82 y.o. 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  Neurology was consulted and I was requested to admit the patient    Subjective:    Donald Rios today has, No headache, No chest pain, No abdominal pain - No Nausea, No new weakness  tingling or numbness, no shortness of breath, continues to have near total left eye vision loss.   Assessment  & Plan :   Gradually progressive but near total left eye vision loss ongoing for day and a half, near total vision  loss for the last 12 hours.  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  Remote strokes noted and cleared by neuro, carotid ultrasound pending, [facility] also consulted for temporal artery biopsy.   2.  CAD s/p CABG.  No acute issues no chest pain, continue home dose aspirin, statin and beta-blocker for secondary prevention.  Cardiology to see.   3.  New diagnosis of atrial fibrillation.  [place] vas 2 score of greater than 3.  Stable TSH, Echo pending, currently on heparin drip after clearance from neurology, on beta-blocker 2 doses of digoxin for better rate control and monitor, cardiology on board.   4.  Previous history of ischemic cardiomyopathy now resolved last echocardiogram in 2021 shows a preserved EF of 65%.  Continue to monitor.   5.  Dyslipidemia.  Continue him on statin, check lipid panel in the morning along with A1c.   6.  GERD with history of GI bleed.  PPI.   7.  Essential hypertension.  Home dose beta-blocker.     8. DM type II. A1c, sliding scale due to high-dose steroids.   Lab Results  Component Value Date   HGBA1C 5.8 (H) 08/14/2022   CBG (last 3)  Recent Labs    08/13/22 1821 08/13/22 2135 08/14/22 0824  GLUCAP 108* 135* 187*        Condition - Extremely Guarded  Family Communication  :  None  Code Status :  Full  Consults  : Cardiology, neurology, vascular surgery  PUD Prophylaxis : PPI   Procedures  :     MRI brain, MRI head and neck.   MRI - 1. No acute intracranial abnormality. 2. 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 3. Innumerable punctate chronic micro hemorrhages involving both cerebral hemispheres. Overall pattern is nonspecific, with differential considerations including sequelae of prior cardiac bypass,  chronic hypertension, cerebral amyloid angiopathy, prior trauma, or possibly vasculitis.   MRA HEAD IMPRESSION: 1. Negative intracranial MRA for large vessel occlusion. No hemodynamically significant or correctable stenosis. 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 5. 3 mm supraclinoid left ICA aneurysm. MRA NECK IMPRESSION: 1. No acute vascular abnormality within the neck. 2. Mild atheromatous irregularity about both carotid artery systems within the neck without hemodynamically significant greater than 50% stenosis. 3. Patent vertebral arteries within the neck. Right vertebral artery dominant.  Echocardiogram.      Disposition Plan  :    Status is: Inpatient   DVT Prophylaxis  :  Hep gtt  heparin injection 5,000 Units Start: 08/13/22 2200  Lab Results  Component Value Date   PLT 183 08/14/2022    Diet :  Diet Order             Diet Heart Room service appropriate? Yes; Fluid consistency: Thin  Diet effective now                    Inpatient Medications  Scheduled Meds:  aspirin  81 mg Oral  Daily   docusate sodium  100 mg Oral BID   doxazosin  2 mg Oral Daily   heparin  5,000 Units Subcutaneous Q8H   insulin aspart  0-5 Units Subcutaneous QHS   insulin aspart  0-9 Units Subcutaneous TID WC   metoprolol tartrate  50 mg Oral BID   mirtazapine  15 mg Oral QHS   pantoprazole  40 mg Oral Daily   rosuvastatin  20 mg Oral Daily   Continuous Infusions:  lactated ringers 75 mL/hr at 08/13/22 2126   methylPREDNISolone (SOLU-MEDROL) injection Stopped (08/13/22 2247)   PRN Meds:.acetaminophen **OR** acetaminophen, LORazepam, ondansetron **OR** ondansetron (ZOFRAN) IV  Antibiotics  :    Anti-infectives (From admission, onward)    None         Objective:   Vitals:   08/13/22 2317 08/14/22 0320 08/14/22 0437 08/14/22 0752  BP: 131/69 111/75  (!) 124/59  Pulse: 96 (!) 124 95 (!) 110  Resp:  18 20  17   Temp: 98.9 F (37.2 C) 98.5 F (36.9 C)  98.1 F (36.7 C)  TempSrc: Oral Oral  Oral  SpO2: 99% 99% 100% 100%  Weight: 65.7 kg     Height: 5\' 8"  (1.727 m)       Wt Readings from Last 3 Encounters:  08/13/22 65.7 kg  07/30/22 69.2 kg  07/22/22 69.4 kg     Intake/Output Summary (Last 24 hours) at 08/14/2022 0818 Last data filed at 08/14/2022 0600 Gross per 24 hour  Intake 623.5 ml  Output --  Net 623.5 ml     Physical Exam  Awake Alert, No new F.N deficits, near-total left eye vision loss [place].AT,PERRAL Supple Neck, No JVD,   Symmetrical Chest wall movement, Good air movement bilaterally, CTAB iRRR,No Gallops,Rubs or new Murmurs,  +ve B.Sounds, Abd Soft, No tenderness,   No Cyanosis, Clubbing or edema          Data Review:    Recent Labs  Lab 08/12/22 1132 08/13/22 1340 08/14/22 0317  WBC 6.2 7.0 4.1  HGB 9.7* 9.5* 9.4*  HCT 29.5* 30.7* 29.1*  PLT 228 274 183  MCV 94.9 98.1 99.3  MCH 31.2 30.4 32.1  MCHC 32.9 30.9 32.3  RDW 14.3 15.4 15.2  LYMPHSABS 1,004 1.3 0.3*  MONOABS  --  0.5 0.1  EOSABS 130 0.1 0.0  BASOSABS 19 0.0 0.0    Recent Labs  Lab 08/12/22 1132 08/13/22 1340 08/14/22 0317  NA  --  140 137  K  --  3.9 4.3  CL  --  106 104  CO2  --  23 22  ANIONGAP  --  11 11  GLUCOSE  --  169* 177*  BUN  --  10 13  CREATININE  --  1.13 1.12  AST  --   --  14*  ALT  --   --  14  ALKPHOS  --   --  50  BILITOT  --   --  1.2  ALBUMIN  --   --  2.9*  CRP 56.3*  --  5.5*  TSH  --   --  1.145  HGBA1C  --   --  5.8*  BNP  --   --  690.7*  MG  --   --  2.2  CALCIUM  --  8.9 8.7*      Recent Labs  Lab 08/12/22 1132 08/13/22 1340 08/14/22 0317  CRP 56.3*  --  5.5*  TSH  --   --  1.145  HGBA1C  --   --  5.8*  BNP  --   --  690.7*  MG  --   --  2.2  CALCIUM  --  8.9 8.7*    Recent Labs  Lab 08/12/22 1132 08/13/22 1340 08/14/22 0317  WBC 6.2 7.0 4.1  PLT 228 274 183  CRP 56.3*  --  5.5*  CREATININE  --  1.13 1.12     ------------------------------------------------------------------------------------------------------------------ Lab Results  Component Value Date   CHOL 93 08/14/2022   HDL 34 (L) 08/14/2022   LDLCALC 52 08/14/2022   TRIG 36 08/14/2022   CHOLHDL 2.7 08/14/2022    Lab Results  Component Value Date   HGBA1C 5.8 (H) 08/14/2022    Recent Labs    08/14/22 0317  TSH 1.145   ------------------------------------------------------------------------------------------------------------------ Cardiac Enzymes No results for input(s): "CKMB", "TROPONINI", "MYOGLOBIN" in the last 168 hours.  Invalid input(s): "CK"  Micro Results No results found for this or any previous visit (from the past 240 hour(s)).  Radiology Reports CT HEAD WO CONTRAST ( )  Result Date: 08/13/2022 CLINICAL DATA:  Initial evaluation for headache, neuro deficit. EXAM: CT HEAD WITHOUT CONTRAST TECHNIQUE: Contiguous axial images were obtained from the base of the skull through the vertex without intravenous contrast. RADIATION DOSE REDUCTION: This exam was performed according to the departmental dose-optimization program which includes automated exposure control, adjustment of the mA and/or kV according to patient size and/or use of iterative reconstruction technique. COMPARISON:  Prior brain MRI performed earlier the same day. FINDINGS: Brain: Mild age-related cerebral atrophy with chronic small vessel ischemic disease. Remote infarcts involving the right basal ganglia, left thalamus, and left cerebellum. No acute intracranial hemorrhage. No acute large vessel territory infarct. No mass lesion or midline shift. No hydrocephalus or extra-axial fluid collection. Vascular: No abnormal hyperdense vessel. Calcified atherosclerosis present at skull base. Skull: Scalp soft tissues demonstrate no acute finding. Calvarium intact. Sinuses/Orbits: Globes and orbital soft tissues within normal limits. Paranasal sinuses and mastoid  air cells are largely clear. Other: None. IMPRESSION: 1. No acute intracranial abnormality. 2. Remote infarcts involving the right basal ganglia, left thalamus, and left cerebellum. 3. Mild age-related cerebral atrophy with chronic small vessel ischemic disease. Electronically Signed   By: Rise Mu M.D.   On: 08/13/2022 20:46   MR BRAIN WO CONTRAST  Result Date: 08/13/2022 CLINICAL DATA:  Initial evaluation for headache, neuro deficit. EXAM: MRI HEAD WITHOUT CONTRAST MRA HEAD WITHOUT CONTRAST MRA NECK WITHOUT CONTRAST TECHNIQUE: Multiplanar, multiecho pulse sequences of the brain and surrounding structures were obtained without intravenous contrast. Angiographic images of the Circle of Willis were obtained using MRA technique without intravenous contrast. Angiographic images of the neck were obtained using MRA technique without intravenous contrast. Carotid stenosis measurements (when applicable) are obtained utilizing NASCET criteria, using the distal internal carotid diameter as the denominator. COMPARISON:  None Available. FINDINGS: MRI HEAD FINDINGS Brain: Cerebral volume within normal limits. Mild chronic microvascular ischemic disease for age. Remote lacunar infarct present at the anterior right basal ganglia. Additional small remote left cerebellar infarct. Associated chronic blood products noted about these chronic infarcts. Additional chronic lacunar infarct present at the left thalamocapsular region. No evidence for acute or subacute ischemia. Gray-white matter differentiation maintained. No acute intracranial hemorrhage. Innumerable punctate chronic micro hemorrhages noted involving both cerebral hemispheres, overall pattern nonspecific. No mass lesion, midline shift or mass effect. No hydrocephalus or extra-axial fluid collection. Pituitary gland and suprasellar region within normal limits. Vascular: Major intracranial vascular flow voids  are maintained. Skull and upper cervical spine:  Cranial junction within normal limits. Bone marrow signal intensity normal. No scalp soft tissue abnormality. Sinuses/Orbits: Prior bilateral ocular lens replacement. Paranasal sinuses are largely clear. Trace right mastoid effusion noted to left of significance. Other: None. MRA HEAD FINDINGS ANTERIOR CIRCULATION: Visualized distal cervical segments of both internal carotid arteries are patent with antegrade flow. Atheromatous irregularity about the cavernous ICAs bilaterally without hemodynamically significant stenosis. 2 mm outpouching extending anteriorly from the cavernous right ICA consistent with a small aneurysm (series 5, image 77). Additional 3 mm outpouching extending posteriorly from the cavernous left ICA also suspicious for aneurysm (series 5, image 76). 3 mm outpouching extending inferiorly from the supraclinoid left ICA could reflect an additional small aneurysm (series 5, image 87). A1 segments patent bilaterally. Normal anterior communicating complex. Anterior cerebral arteries widely patent. No M1 stenosis or occlusion. 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 Distal MCA branches perfused and symmetric. POSTERIOR CIRCULATION: Both V4 segments are patent without stenosis. Right vertebral artery dominant. Right PICA patent at its origin. Left PICA origin not seen. Basilar patent without stenosis. Superior cerebellar and posterior cerebral arteries widely patent bilaterally. MRA NECK FINDINGS AORTIC ARCH: Visualized aortic arch within normal limits for caliber with standard branch pattern. No visible stenosis about the origin of the great vessels. RIGHT CAROTID SYSTEM: Right common and internal carotid arteries are patent with antegrade flow. No evidence for dissection. Atheromatous irregularity about the right carotid bulb without hemodynamically significant greater than 50% stenosis. No ulceration. LEFT CAROTID SYSTEM: Left common and internal carotid arteries are  patent with antegrade flow. No evidence for dissection. Atheromatous irregularity about the left carotid bulb without hemodynamically significant greater than 50% stenosis. No ulceration. VERTEBRAL ARTERIES: Both vertebral arteries arise from the subclavian arteries. No visible proximal subclavian artery stenosis. Right vertebral artery dominant. Vertebral arteries are patent with antegrade flow. No dissection or stenosis. IMPRESSION: MRI HEAD IMPRESSION: 1. No acute intracranial abnormality. 2. 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 3. Innumerable punctate chronic micro hemorrhages involving both cerebral hemispheres. Overall pattern is nonspecific, with differential considerations including sequelae of prior cardiac bypass, chronic hypertension, cerebral amyloid angiopathy, prior trauma, or possibly vasculitis. MRA HEAD IMPRESSION: 1. Negative intracranial MRA for large vessel occlusion. No hemodynamically significant or correctable stenosis. 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 5. 3 mm supraclinoid left ICA aneurysm. MRA NECK IMPRESSION: 1. No acute vascular abnormality within the neck. 2. Mild atheromatous irregularity about both carotid artery systems within the neck without hemodynamically significant greater than 50% stenosis. 3. Patent vertebral arteries within the neck. Right vertebral artery dominant. Electronically Signed   By: Rise Mu M.D.   On: 08/13/2022 20:43   MR ANGIO HEAD WO CONTRAST  Result Date: 08/13/2022 CLINICAL DATA:  Initial evaluation for headache, neuro deficit. EXAM: MRI HEAD WITHOUT CONTRAST MRA HEAD WITHOUT CONTRAST MRA NECK WITHOUT CONTRAST TECHNIQUE: Multiplanar, multiecho pulse sequences of the brain and surrounding structures were obtained without intravenous contrast. Angiographic images of the Circle of Willis were  obtained using MRA technique without intravenous contrast. Angiographic images of the neck were obtained using MRA technique without intravenous contrast. Carotid stenosis measurements (when applicable) are obtained utilizing NASCET criteria, using the distal internal carotid diameter as the denominator. COMPARISON:  None Available. FINDINGS: MRI HEAD FINDINGS Brain: Cerebral volume within  normal limits. Mild chronic microvascular ischemic disease for age. Remote lacunar infarct present at the anterior right basal ganglia. Additional small remote left cerebellar infarct. Associated chronic blood products noted about these chronic infarcts. Additional chronic lacunar infarct present at the left thalamocapsular region. No evidence for acute or subacute ischemia. Gray-white matter differentiation maintained. No acute intracranial hemorrhage. Innumerable punctate chronic micro hemorrhages noted involving both cerebral hemispheres, overall pattern nonspecific. No mass lesion, midline shift or mass effect. No hydrocephalus or extra-axial fluid collection. Pituitary gland and suprasellar region within normal limits. Vascular: Major intracranial vascular flow voids are maintained. Skull and upper cervical spine: Cranial junction within normal limits. Bone marrow signal intensity normal. No scalp soft tissue abnormality. Sinuses/Orbits: Prior bilateral ocular lens replacement. Paranasal sinuses are largely clear. Trace right mastoid effusion noted to left of significance. Other: None. MRA HEAD FINDINGS ANTERIOR CIRCULATION: Visualized distal cervical segments of both internal carotid arteries are patent with antegrade flow. Atheromatous irregularity about the cavernous ICAs bilaterally without hemodynamically significant stenosis. 2 mm outpouching extending anteriorly from the cavernous right ICA consistent with a small aneurysm (series 5, image 77). Additional 3 mm outpouching extending posteriorly from the cavernous left  ICA also suspicious for aneurysm (series 5, image 76). 3 mm outpouching extending inferiorly from the supraclinoid left ICA could reflect an additional small aneurysm (series 5, image 87). A1 segments patent bilaterally. Normal anterior communicating complex. Anterior cerebral arteries widely patent. No M1 stenosis or occlusion. 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 Distal MCA branches perfused and symmetric. POSTERIOR CIRCULATION: Both V4 segments are patent without stenosis. Right vertebral artery dominant. Right PICA patent at its origin. Left PICA origin not seen. Basilar patent without stenosis. Superior cerebellar and posterior cerebral arteries widely patent bilaterally. MRA NECK FINDINGS AORTIC ARCH: Visualized aortic arch within normal limits for caliber with standard branch pattern. No visible stenosis about the origin of the great vessels. RIGHT CAROTID SYSTEM: Right common and internal carotid arteries are patent with antegrade flow. No evidence for dissection. Atheromatous irregularity about the right carotid bulb without hemodynamically significant greater than 50% stenosis. No ulceration. LEFT CAROTID SYSTEM: Left common and internal carotid arteries are patent with antegrade flow. No evidence for dissection. Atheromatous irregularity about the left carotid bulb without hemodynamically significant greater than 50% stenosis. No ulceration. VERTEBRAL ARTERIES: Both vertebral arteries arise from the subclavian arteries. No visible proximal subclavian artery stenosis. Right vertebral artery dominant. Vertebral arteries are patent with antegrade flow. No dissection or stenosis. IMPRESSION: MRI HEAD IMPRESSION: 1. No acute intracranial abnormality. 2. 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 3. Innumerable punctate chronic micro hemorrhages involving both cerebral hemispheres. Overall pattern is  nonspecific, with differential considerations including sequelae of prior cardiac bypass, chronic hypertension, cerebral amyloid angiopathy, prior trauma, or possibly vasculitis. MRA HEAD IMPRESSION: 1. Negative intracranial MRA for large vessel occlusion. No hemodynamically significant or correctable stenosis. 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 5. 3 mm supraclinoid left ICA aneurysm. MRA NECK IMPRESSION: 1. No acute vascular abnormality within the neck. 2. Mild atheromatous irregularity about both carotid artery systems within the neck without hemodynamically significant greater than 50% stenosis. 3. Patent vertebral arteries within the neck. Right vertebral artery dominant. Electronically Signed   By: Rise Mu M.D.   On: 08/13/2022 20:43   MR ANGIO NECK WO CONTRAST  Result  Date: 08/13/2022 CLINICAL DATA:  Initial evaluation for headache, neuro deficit. EXAM: MRI HEAD WITHOUT CONTRAST MRA HEAD WITHOUT CONTRAST MRA NECK WITHOUT CONTRAST TECHNIQUE: Multiplanar, multiecho pulse sequences of the brain and surrounding structures were obtained without intravenous contrast. Angiographic images of the Circle of Willis were obtained using MRA technique without intravenous contrast. Angiographic images of the neck were obtained using MRA technique without intravenous contrast. Carotid stenosis measurements (when applicable) are obtained utilizing NASCET criteria, using the distal internal carotid diameter as the denominator. COMPARISON:  None Available. FINDINGS: MRI HEAD FINDINGS Brain: Cerebral volume within normal limits. Mild chronic microvascular ischemic disease for age. Remote lacunar infarct present at the anterior right basal ganglia. Additional small remote left cerebellar infarct. Associated chronic blood products noted about these chronic infarcts. Additional chronic lacunar infarct present at the left thalamocapsular  region. No evidence for acute or subacute ischemia. Gray-white matter differentiation maintained. No acute intracranial hemorrhage. Innumerable punctate chronic micro hemorrhages noted involving both cerebral hemispheres, overall pattern nonspecific. No mass lesion, midline shift or mass effect. No hydrocephalus or extra-axial fluid collection. Pituitary gland and suprasellar region within normal limits. Vascular: Major intracranial vascular flow voids are maintained. Skull and upper cervical spine: Cranial junction within normal limits. Bone marrow signal intensity normal. No scalp soft tissue abnormality. Sinuses/Orbits: Prior bilateral ocular lens replacement. Paranasal sinuses are largely clear. Trace right mastoid effusion noted to left of significance. Other: None. MRA HEAD FINDINGS ANTERIOR CIRCULATION: Visualized distal cervical segments of both internal carotid arteries are patent with antegrade flow. Atheromatous irregularity about the cavernous ICAs bilaterally without hemodynamically significant stenosis. 2 mm outpouching extending anteriorly from the cavernous right ICA consistent with a small aneurysm (series 5, image 77). Additional 3 mm outpouching extending posteriorly from the cavernous left ICA also suspicious for aneurysm (series 5, image 76). 3 mm outpouching extending inferiorly from the supraclinoid left ICA could reflect an additional small aneurysm (series 5, image 87). A1 segments patent bilaterally. Normal anterior communicating complex. Anterior cerebral arteries widely patent. No M1 stenosis or occlusion. 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 Distal MCA branches perfused and symmetric. POSTERIOR CIRCULATION: Both V4 segments are patent without stenosis. Right vertebral artery dominant. Right PICA patent at its origin. Left PICA origin not seen. Basilar patent without stenosis. Superior cerebellar and posterior cerebral arteries widely patent bilaterally.  MRA NECK FINDINGS AORTIC ARCH: Visualized aortic arch within normal limits for caliber with standard branch pattern. No visible stenosis about the origin of the great vessels. RIGHT CAROTID SYSTEM: Right common and internal carotid arteries are patent with antegrade flow. No evidence for dissection. Atheromatous irregularity about the right carotid bulb without hemodynamically significant greater than 50% stenosis. No ulceration. LEFT CAROTID SYSTEM: Left common and internal carotid arteries are patent with antegrade flow. No evidence for dissection. Atheromatous irregularity about the left carotid bulb without hemodynamically significant greater than 50% stenosis. No ulceration. VERTEBRAL ARTERIES: Both vertebral arteries arise from the subclavian arteries. No visible proximal subclavian artery stenosis. Right vertebral artery dominant. Vertebral arteries are patent with antegrade flow. No dissection or stenosis. IMPRESSION: MRI HEAD IMPRESSION: 1. No acute intracranial abnormality. 2. 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 3. Innumerable punctate chronic micro hemorrhages involving both cerebral hemispheres. Overall pattern is nonspecific, with differential considerations including sequelae of prior cardiac bypass, chronic hypertension, cerebral amyloid angiopathy, prior trauma, or possibly vasculitis. MRA HEAD IMPRESSION: 1. Negative intracranial MRA for large vessel  occlusion. No hemodynamically significant or correctable stenosis. 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 5. 3 mm supraclinoid left ICA aneurysm. MRA NECK IMPRESSION: 1. No acute vascular abnormality within the neck. 2. Mild atheromatous irregularity about both carotid artery systems within the neck without hemodynamically significant greater than 50% stenosis. 3. Patent vertebral arteries within the neck.  Right vertebral artery dominant. Electronically Signed   By: Rise Mu M.D.   On: 08/13/2022 20:43      Signature  -   Susa Raring M.D on 08/14/2022 at 8:18 AM   -  To page go to [email or web address]

## 2022-08-15 DIAGNOSIS — I482 Chronic atrial fibrillation, unspecified: Secondary | ICD-10-CM

## 2022-08-15 DIAGNOSIS — I429 Cardiomyopathy, unspecified: Secondary | ICD-10-CM | POA: Diagnosis not present

## 2022-08-15 DIAGNOSIS — I4891 Unspecified atrial fibrillation: Secondary | ICD-10-CM | POA: Diagnosis not present

## 2022-08-15 DIAGNOSIS — M316 Other giant cell arteritis: Secondary | ICD-10-CM | POA: Diagnosis not present

## 2022-08-15 LAB — CBC WITH DIFFERENTIAL/PLATELET
Abs Immature Granulocytes: 0.02 10*3/uL (ref 0.00–0.07)
Basophils Absolute: 0 10*3/uL (ref 0.0–0.1)
Basophils Relative: 0 %
Eosinophils Absolute: 0 10*3/uL (ref 0.0–0.5)
Eosinophils Relative: 0 %
HCT: 27.8 % — ABNORMAL LOW (ref 39.0–52.0)
Hemoglobin: 9.1 g/dL — ABNORMAL LOW (ref 13.0–17.0)
Immature Granulocytes: 1 %
Lymphocytes Relative: 12 %
Lymphs Abs: 0.4 10*3/uL — ABNORMAL LOW (ref 0.7–4.0)
MCH: 30.6 pg (ref 26.0–34.0)
MCHC: 32.7 g/dL (ref 30.0–36.0)
MCV: 93.6 fL (ref 80.0–100.0)
Monocytes Absolute: 0.1 10*3/uL (ref 0.1–1.0)
Monocytes Relative: 4 %
Neutro Abs: 2.9 10*3/uL (ref 1.7–7.7)
Neutrophils Relative %: 83 %
Platelets: 168 10*3/uL (ref 150–400)
RBC: 2.97 MIL/uL — ABNORMAL LOW (ref 4.22–5.81)
RDW: 15 % (ref 11.5–15.5)
WBC: 3.5 10*3/uL — ABNORMAL LOW (ref 4.0–10.5)
nRBC: 0.6 % — ABNORMAL HIGH (ref 0.0–0.2)

## 2022-08-15 LAB — COMPREHENSIVE METABOLIC PANEL WITH GFR
ALT: 15 U/L (ref 0–44)
AST: 17 U/L (ref 15–41)
Albumin: 3.1 g/dL — ABNORMAL LOW (ref 3.5–5.0)
Alkaline Phosphatase: 53 U/L (ref 38–126)
Anion gap: 10 (ref 5–15)
BUN: 15 mg/dL (ref 8–23)
CO2: 23 mmol/L (ref 22–32)
Calcium: 8.9 mg/dL (ref 8.9–10.3)
Chloride: 105 mmol/L (ref 98–111)
Creatinine, Ser: 1.05 mg/dL (ref 0.61–1.24)
GFR, Estimated: >60 mL/min (ref >60)
Glucose, Bld: 173 mg/dL — ABNORMAL HIGH (ref 70–99)
Potassium: 3.9 mmol/L (ref 3.5–5.1)
Sodium: 138 mmol/L (ref 135–145)
Total Bilirubin: 1 mg/dL (ref 0.3–1.2)
Total Protein: 5.9 g/dL — ABNORMAL LOW (ref 6.5–8.1)

## 2022-08-15 LAB — HEPARIN LEVEL (UNFRACTIONATED): Heparin Unfractionated: 0.21 [IU]/mL — ABNORMAL LOW (ref 0.30–0.70)

## 2022-08-15 LAB — MAGNESIUM: Magnesium: 2.2 mg/dL (ref 1.7–2.4)

## 2022-08-15 LAB — GLUCOSE, CAPILLARY
Glucose-Capillary: 192 mg/dL — ABNORMAL HIGH (ref 70–99)
Glucose-Capillary: 177 mg/dL — ABNORMAL HIGH (ref 70–99)
Glucose-Capillary: 236 mg/dL — ABNORMAL HIGH (ref 70–99)
Glucose-Capillary: 190 mg/dL — ABNORMAL HIGH (ref 70–99)

## 2022-08-15 LAB — C-REACTIVE PROTEIN: CRP: 3.7 mg/dL — ABNORMAL HIGH (ref <1.0)

## 2022-08-15 LAB — BRAIN NATRIURETIC PEPTIDE: B Natriuretic Peptide: 751.5 pg/mL — ABNORMAL HIGH (ref 0.0–100.0)

## 2022-08-15 MED ORDER — SODIUM CHLORIDE 0.9 % IV SOLN: INTRAVENOUS | Status: DC

## 2022-08-15 MED ORDER — APIXABAN 5 MG PO TABS
5.0000 mg | ORAL_TABLET | Freq: Two times a day (BID) | ORAL | Status: DC
  Administered 2022-08-15 – 2022-08-16 (×3): 5 mg via ORAL
  Filled 2022-08-15 (×3): qty 1

## 2022-08-15 NOTE — Progress Notes (Signed)
During AM assessment patient complained of blurry vision to right eye, stating this was new.  Dr Thedore Mins updated via secure chat (at [phone]) and order was received to give IV steroid early.  Dose originally scheduled for noon, administered 0845 per Dr Thedore Mins.

## 2022-08-15 NOTE — Progress Notes (Signed)
ANTICOAGULATION CONSULT NOTE - Follow Up Consult  Pharmacy Consult for IV heparin >> apixaban Indication: afib  No Known Allergies  Patient Measurements: Height: 5\' 8"  (172.7 cm) Weight: 65.7 kg (144 lb 14.4 oz) IBW/kg (Calculated) : 68.4 Heparin Dosing Weight: 68.4 kg  Vital Signs: Temp: 98.5 F (36.9 C) (08/04 0236) Temp Source: Oral (08/04 0236) BP: 138/72 (08/04 0236) Pulse Rate: 65 (08/04 0236)  Labs: Recent Labs    08/13/22 1340 08/14/22 0317 08/14/22 1724 08/15/22 0241  HGB 9.5* 9.4*  --  9.1*  HCT 30.7* 29.1*  --  27.8*  PLT 274 183  --  168  HEPARINUNFRC  --   --  0.13* 0.21*  CREATININE 1.13 1.12  --  1.05    Estimated Creatinine Clearance: 52.1 mL/min (by C-G formula based on SCr of 1.05 mg/dL).   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    History of MI (myocardial infarction)    History of nuclear stress test 08/07/2010   dipyridamole; EKG negative for ischemia, low risk scan    Hyperlipidemia    Hyperplastic colon polyp 05/2008   Hypertension    Ischemic cardiomyopathy    EF 45%, with inferior wall motion abnormality    Myocardial infarction (HCC)    Valvular regurgitation    mitral and tricuspid (mild)    Medications:  Infusions:   sodium chloride Stopped (08/14/22 1406)   heparin 1,050 Units/hr (08/15/22 0524)   methylPREDNISolone (SOLU-MEDROL) injection Stopped (08/14/22 1335)    Assessment: 82 yo male with new onset afib.  Pharmacy asked to start anticoagulation with IV heparin  and switch to apixaban. Patient is 82 yo, sCr 1.05 (bl~1-1.1), and 65.7 kg  Goal of Therapy:  Monitor platelets by anticoagulation protocol: Yes   Plan:  Discontinue heparin and switch to apixaban 5 mg PO BID Monitor CBC and s/sx of bleeding Follow up copay and eliquis education   Arabella Merles, PharmD. Clinical Pharmacist 08/15/2022 6:21 AM

## 2022-08-15 NOTE — Plan of Care (Signed)
  Problem: Nutrition: Goal: Adequate nutrition will be maintained Outcome: Progressing   

## 2022-08-15 NOTE — Progress Notes (Signed)
Subjective: He had some blurred vision in his right eye this morning.  He complains of floaters, which have been there since April, currently no blurring of his right eye.  Exam: Vitals:   08/15/22 1102 08/15/22 1214  BP: 131/63 126/60  Pulse: 74 73  Resp:  18  Temp:  98.2 F (36.8 C)  SpO2:     Gen: In bed, NAD Resp: non-labored breathing, no acute distress Abd: soft, nt  Neuro: Mental Status: Patient is awake, alert, oriented to person, place, month, year, and situation. Patient is able to give a clear and coherent history. No signs of aphasia or neglect Cranial Nerves: II: Decreased vision in left eye.  Able to see fingers waving and able to see how many were being held up. Can see some colors and outlines of shapes. Unable to read close up for at a distance.+ apd Able to count fingers in all fields in the right eye.  III,IV, VI: EOMI without pain, ptosis or diploplia.  V: Facial sensation is symmetric to temperature VII: Facial movement is symmetric.  VIII: hearing is intact to voice X: Uvula elevates symmetrically XI: Shoulder shrug is symmetric. XII: tongue is midline without atrophy or fasciculations.  Motor: Tone is normal. Bulk is normal. 5/5 strength was present in all four extremities.  Sensory: Sensation is symmetric to light touch and temperature in the arms and legs. Cerebellar: FNF and HKS are intact bilaterally  Pertinent Imaging:  [place] Temporal Artery:  (Prelim) Positive for HALO sign bilaterally  MRI Head:  No acute abnormality. Chronic infarcts R basal gnaglina, L cerebellum. Multiple punctate chronic micro-hemorrhages involving both hemispheres.   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  MRA Head:  Negative   ECHO: LVEF 45-50%, Global hypokinesis. Mild LVH. Mild to moderate TVR.   Impression: Donald Rios is a 82 y.o. male  with PMH of DM2, dyslipidemia ,CAD s/p CABG, EF 45%, who presented to the ED 8/2 with complaints of left eye vision loss, worsening since Wednesday night. 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    He has now received 3 days of IV Solu-Medrol (today is day three) and I would start prednisone 60 mg tomorrow.  He will need follow-up with rheumatology as an outpatient for further management of his immune suppression.  He has a history of polymyalgia rheumatica and has seen a rheumatologist in the past.  Recommendations: - Solu-Medrol for 3 doses total, then prednisone 60mg  - Continue anticoagulation for atrial fibrillation -Neurology will be available on an as-needed basis, please call with further questions or concerns.  Ritta Slot, MD [facility] [phone]  If 7pm- 7am, please page neurology on call as listed in AMION.

## 2022-08-15 NOTE — Progress Notes (Signed)
PROGRESS NOTE                                                                                                                                                                                                             Patient Demographics:    Donald Rios, is a 82 y.o. male, DOB - 01/24/1940, ZOX:[id number]  Outpatient Primary MD for the patient is [name], MD    LOS - 2  Admit date - 08/13/2022    Chief Complaint  Patient presents with   Temporal Arteritis       Brief Narrative (HPI from H&P)   82 y.o. 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  Neurology was consulted and I was requested to admit the patient    Subjective:   Patient in bed, appears comfortable, denies any headache, no fever, no chest pain or pressure, no shortness of breath ,  no abdominal pain. No focal weakness.  Continues to have near total left eye vision loss, some blurriness off-and-on in the right eye.   Assessment  & Plan :   Gradually progressive but near total left eye  vision present on admission, mildly blurry right eye vision developed after hospitalization despite being on high-dose steroids.  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  Remote strokes noted and cleared by neuro, rotted vascular ultrasound with classic bilateral halo sign noted, continue high-dose steroids along with Eliquis for A-fib.  This discussed with neurology again on 08/15/2022, monitor.   2.  CAD s/p CABG.  No acute issues no chest pain, continue home dose aspirin, statin and beta-blocker for secondary prevention.  Cardiology to see.   3.  New diagnosis of atrial fibrillation.  [place] vas 2 score of greater than 3.  Stable TSH, echo noted as below, currently on heparin drip and now on Eliquis after clearance with neurology.   4.  Previous history of ischemic EF 45% with global hypokinesis on echocardiogram. Cards on board, clinically compensated, on beta-blocker, if blood pressure allows low-dose ACE inhibitor, monitor .   5.  Dyslipidemia.  Continue him on statin, check lipid panel in the morning along with A1c.   6.  GERD with history of GI bleed.  PPI.   7.  Essential hypertension.  Home dose beta-blocker.     8. DM type II. A1c, sliding scale due to high-dose steroids.   Lab Results  Component Value Date   HGBA1C 5.8 (H) 08/14/2022   CBG (last 3)  Recent Labs    08/14/22 1640 08/14/22 2129 08/15/22 0801  GLUCAP 257* 329* 192*        Condition - Extremely Guarded  Family Communication  :  None  Code Status :  Full  Consults  : Cardiology, neurology, vascular surgery  PUD Prophylaxis : PPI   Procedures  :     VAS [place] - Presence of a "halo" sign in the bilateral temporal artery suggests temporal arteritis.  *See table(s) above for measurements and observations  MRI brain, MRI head and neck.    MRI - 1. No acute intracranial abnormality. 2. 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 3. Innumerable punctate chronic micro hemorrhages involving both cerebral hemispheres. Overall pattern is nonspecific, with differential considerations including sequelae of prior cardiac bypass, chronic hypertension, cerebral amyloid angiopathy, prior trauma, or possibly vasculitis.   MRA HEAD IMPRESSION: 1. Negative intracranial MRA for large vessel occlusion. No hemodynamically significant or correctable stenosis. 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 5. 3 mm supraclinoid left ICA aneurysm. MRA NECK IMPRESSION: 1. No acute vascular abnormality within the neck. 2. Mild atheromatous irregularity about both carotid artery systems within the neck without hemodynamically significant greater than 50% stenosis. 3. Patent vertebral arteries within the neck. Right vertebral artery dominant.  Echocardiogram. 1. Left ventricular ejection fraction, by estimation, is 45 to 50%. The left ventricle has mildly decreased function. The left ventricle demonstrates global hypokinesis. There is mild left ventricular hypertrophy. Left ventricular diastolic function could not be evaluated. Elevated left ventricular end-diastolic pressure.  2. Right ventricular systolic function is normal. The right ventricular size is normal. There is severely elevated pulmonary  artery systolic pressure. The estimated right ventricular systolic pressure is 62.2 mmHg.  3. Left atrial size was severely dilated.  4. The mitral valve is normal in structure. Mild to moderate mitral valve regurgitation. No evidence of mitral stenosis.  5. The tricuspid valve is abnormal. Tricuspid valve regurgitation is mild to moderate.  6. The aortic valve is tricuspid. Aortic valve regurgitation is not visualized. No aortic stenosis  is present.  7. The inferior vena cava is dilated in size with >50% respiratory variability, suggesting right atrial pressure of 8 mmHg.      Disposition Plan  :    Status is: Inpatient   DVT Prophylaxis  :  Hep gtt   apixaban (ELIQUIS) tablet 5 mg  Lab Results  Component Value Date   PLT 168 08/15/2022    Diet :  Diet Order             Diet Heart Room service appropriate? Yes; Fluid consistency: Thin  Diet effective now                    Inpatient Medications  Scheduled Meds:  apixaban  5 mg Oral BID   aspirin  81 mg Oral Daily   docusate sodium  100 mg Oral BID   doxazosin  2 mg Oral Daily   insulin aspart  0-5 Units Subcutaneous QHS   insulin aspart  0-9 Units Subcutaneous TID WC   metoprolol tartrate  75 mg Oral Q6H   mirtazapine  15 mg Oral QHS   pantoprazole  40 mg Oral Daily   rosuvastatin  20 mg Oral Daily   Continuous Infusions:  sodium chloride 10 mL/hr at 08/15/22 0843   methylPREDNISolone (SOLU-MEDROL) injection 1,000 mg (08/15/22 0845)   PRN Meds:.sodium chloride, acetaminophen **OR** acetaminophen, diltiazem, LORazepam, ondansetron **OR** ondansetron (ZOFRAN) IV  Antibiotics  :    Anti-infectives (From admission, onward)    None         Objective:   Vitals:   08/14/22 1924 08/14/22 2143 08/14/22 2338 08/15/22 0236  BP: 127/62 (!) 122/50 134/65 138/72  Pulse: 74 73 73 65  Resp: 18  18 20   Temp: 97.8 F (36.6 C)  98.2 F (36.8 C) 98.5 F (36.9 C)  TempSrc: Oral  Oral Oral  SpO2: 99%  100% 97%  Weight:      Height:        Wt Readings from Last 3 Encounters:  08/13/22 65.7 kg  07/30/22 69.2 kg  07/22/22 69.4 kg     Intake/Output Summary (Last 24 hours) at 08/15/2022 0935 Last data filed at 08/15/2022 0900 Gross per 24 hour  Intake 1240.64 ml  Output 1700 ml  Net -459.36 ml     Physical Exam  Awake Alert, No new F.N deficits, near-total left eye vision loss, mildly blurry vision in the [place].AT,PERRAL Supple Neck,  No JVD,   Symmetrical Chest wall movement, Good air movement bilaterally, CTAB iRRR,No Gallops,Rubs or new Murmurs,  +ve B.Sounds, Abd Soft, No tenderness,   No Cyanosis, Clubbing or edema          Data Review:    Recent Labs  Lab 08/12/22 1132 08/13/22 1340 08/14/22 0317 08/15/22 0241  WBC 6.2 7.0 4.1 3.5*  HGB 9.7* 9.5* 9.4* 9.1*  HCT 29.5* 30.7* 29.1* 27.8*  PLT 228 274 183 168  MCV 94.9 98.1 99.3 93.6  MCH 31.2 30.4 32.1 30.6  MCHC 32.9 30.9 32.3 32.7  RDW 14.3 15.4 15.2 15.0  LYMPHSABS  1,004 1.3 0.3* 0.4*  MONOABS  --  0.5 0.1 0.1  EOSABS 130 0.1 0.0 0.0  BASOSABS 19 0.0 0.0 0.0    Recent Labs  Lab 08/12/22 1132 08/13/22 1340 08/14/22 0317 08/15/22 0241  NA  --  140 137 138  K  --  3.9 4.3 3.9  CL  --  106 104 105  CO2  --  23 22 23   ANIONGAP  --  11 11 10   GLUCOSE  --  169* 177* 173*  BUN  --  10 13 15   CREATININE  --  1.13 1.12 1.05  AST  --   --  14* 17  ALT  --   --  14 15  ALKPHOS  --   --  50 53  BILITOT  --   --  1.2 1.0  ALBUMIN  --   --  2.9* 3.1*  CRP 56.3*  --  5.5* 3.7*  TSH  --   --  1.145  --   HGBA1C  --   --  5.8*  --   BNP  --   --  690.7* 751.5*  MG  --   --  2.2 2.2  CALCIUM  --  8.9 8.7* 8.9      Recent Labs  Lab 08/12/22 1132 08/13/22 1340 08/14/22 0317 08/15/22 0241  CRP 56.3*  --  5.5* 3.7*  TSH  --   --  1.145  --   HGBA1C  --   --  5.8*  --   BNP  --   --  690.7* 751.5*  MG  --   --  2.2 2.2  CALCIUM  --  8.9 8.7* 8.9    Recent Labs  Lab 08/12/22 1132 08/13/22 1340 08/14/22 0317 08/15/22 0241  WBC 6.2 7.0 4.1 3.5*  PLT 228 274 183 168  CRP 56.3*  --  5.5* 3.7*  CREATININE  --  1.13 1.12 1.05    ------------------------------------------------------------------------------------------------------------------ Lab Results  Component Value Date   CHOL 93 08/14/2022   HDL 34 (L) 08/14/2022   LDLCALC 52 08/14/2022   TRIG 36 08/14/2022   CHOLHDL 2.7 08/14/2022    Lab Results  Component Value Date    HGBA1C 5.8 (H) 08/14/2022    Recent Labs    08/14/22 0317  TSH 1.145   ------------------------------------------------------------------------------------------------------------------ Cardiac Enzymes No results for input(s): "CKMB", "TROPONINI", "MYOGLOBIN" in the last 168 hours.  Invalid input(s): "CK"  Micro Results No results found for this or any previous visit (from the past 240 hour(s)).  Radiology Reports ECHOCARDIOGRAM COMPLETE  Result Date: 08/14/2022    ECHOCARDIOGRAM REPORT   Patient Name:   Donald Rios Date of Exam: 08/14/2022 Medical Rec #:  [id number]      Height:       68.0 in Accession #:    [id number]     Weight:       144.9 lb Date of Birth:  [birth year]      BSA:          1.782 m Patient Age:    81 years       BP:           111/75 mmHg Patient Gender: M              HR:           107 bpm. Exam Location:  Inpatient Procedure: 2D Echo, Cardiac Doppler, Color Doppler and Intracardiac  Opacification Agent Indications:    CHF-Acute Diastolic I50.31  History:        Patient has prior history of Echocardiogram examinations, most                 recent 10/02/2019. Cardiomyopathy and CHF, CAD, Prior CABG; Risk                 Factors:Hypertension, Diabetes and Dyslipidemia.  Sonographer:    Lucendia Herrlich Referring Phys: Effie Shy  [facility] IMPRESSIONS  1. Left ventricular ejection fraction, by estimation, is 45 to 50%. The left ventricle has mildly decreased function. The left ventricle demonstrates global hypokinesis. There is mild left ventricular hypertrophy. Left ventricular diastolic function could not be evaluated. Elevated left ventricular end-diastolic pressure.  2. Right ventricular systolic function is normal. The right ventricular size is normal. There is severely elevated pulmonary artery systolic pressure. The estimated right ventricular systolic pressure is 62.2 mmHg.  3. Left atrial size was severely dilated.  4. The mitral valve is normal in structure. Mild  to moderate mitral valve regurgitation. No evidence of mitral stenosis.  5. The tricuspid valve is abnormal. Tricuspid valve regurgitation is mild to moderate.  6. The aortic valve is tricuspid. Aortic valve regurgitation is not visualized. No aortic stenosis is present.  7. The inferior vena cava is dilated in size with >50% respiratory variability, suggesting right atrial pressure of 8 mmHg. Comparison(s): Changes from prior study are noted. LVEF worsened to 45-50% from 60-65% in 2021. FINDINGS  Left Ventricle: Left ventricular ejection fraction, by estimation, is 45 to 50%. The left ventricle has mildly decreased function. The left ventricle demonstrates global hypokinesis. Definity contrast agent was given IV to delineate the left ventricular  endocardial borders. The left ventricular internal cavity size was normal in size. There is mild left ventricular hypertrophy. Left ventricular diastolic function could not be evaluated due to atrial fibrillation. Left ventricular diastolic function could not be evaluated. Elevated left ventricular end-diastolic pressure. Right Ventricle: The right ventricular size is normal. No increase in right ventricular wall thickness. Right ventricular systolic function is normal. There is severely elevated pulmonary artery systolic pressure. The tricuspid regurgitant velocity is 3.68 m/s, and with an assumed right atrial pressure of 8 mmHg, the estimated right ventricular systolic pressure is 62.2 mmHg. Left Atrium: Left atrial size was severely dilated. Right Atrium: Right atrial size was normal in size. Pericardium: There is no evidence of pericardial effusion. Mitral Valve: The mitral valve is normal in structure. Mild to moderate mitral valve regurgitation. No evidence of mitral valve stenosis. Tricuspid Valve: The tricuspid valve is abnormal. Tricuspid valve regurgitation is mild to moderate. No evidence of tricuspid stenosis. Aortic Valve: The aortic valve is tricuspid. Aortic  valve regurgitation is not visualized. No aortic stenosis is present. Aortic valve peak gradient measures 7.6 mmHg. Pulmonic Valve: The pulmonic valve was normal in structure. Pulmonic valve regurgitation is trivial. No evidence of pulmonic stenosis. Aorta: The aortic root and ascending aorta are structurally normal, with no evidence of dilitation. Venous: The inferior vena cava is dilated in size with greater than 50% respiratory variability, suggesting right atrial pressure of 8 mmHg. IAS/Shunts: No atrial level shunt detected by color flow Doppler.  LEFT VENTRICLE PLAX 2D LVIDd:         4.50 cm   Diastology LVIDs:         3.70 cm   LV e' medial:    6.96 cm/s LV PW:  1.25 cm   LV E/e' medial:  20.0 LV IVS:        1.20 cm   LV e' lateral:   10.90 cm/s LVOT diam:     2.20 cm   LV E/e' lateral: 12.8 LV SV:         56 LV SV Index:   31 LVOT Area:     3.80 cm  RIGHT VENTRICLE            IVC RV S prime:     9.90 cm/s  IVC diam: 2.40 cm TAPSE (M-mode): 1.5 cm LEFT ATRIUM             Index        RIGHT ATRIUM           Index LA diam:        5.90 cm 3.31 cm/m   RA Area:     20.10 cm LA Vol (A2C):   85.5 ml 47.98 ml/m  RA Volume:   52.70 ml  29.57 ml/m LA Vol (A4C):   89.6 ml 50.28 ml/m LA Biplane Vol: 90.0 ml 50.50 ml/m  AORTIC VALVE                 PULMONIC VALVE AV Area (Vmax): 2.59 cm     PR End Diast Vel: 6.81 msec AV Vmax:        138.00 cm/s AV Peak Grad:   7.6 mmHg LVOT Vmax:      94.10 cm/s LVOT Vmean:     63.233 cm/s LVOT VTI:       0.148 m  AORTA Ao Root diam: 3.20 cm Ao Asc diam:  2.20 cm MITRAL VALVE                TRICUSPID VALVE MV Area (PHT): 4.96 cm     TR Peak grad:   54.2 mmHg MV Decel Time: 153 msec     TR Vmax:        368.00 cm/s MR Peak grad: 114.9 mmHg MR Vmax:      536.00 cm/s   SHUNTS MV E velocity: 139.00 cm/s  Systemic VTI:  0.15 m                             Systemic Diam: 2.20 cm Vishnu Priya Mallipeddi Electronically signed by Winfield Rast Mallipeddi Signature Date/Time:  08/14/2022/11:25:17 AM    Final    VAS [place] TEMPORAL ARTERY BILATERAL  Result Date: 08/14/2022  TEMPORAL ARTERY REPORT Patient Name:  Donald Rios  Date of Exam:   08/14/2022 Medical Rec #: [id number]       Accession #:    [id number] Date of Birth: Jun 07, 1940       Patient Gender: M Patient Age:   57 years Exam Location:  [facility] Procedure:      VAS [place] TEMPORAL ARTERY BILATERAL Referring Phys: MCNEILL KIRKPATRICK --------------------------------------------------------------------------------  Indications: Jaw pain, blurred vision , abnormal lab values and Ophthalmologist              noted edema of disc, sent patient for Labs, ESR and CRP elevated.              This study was technically difficult secondary to patient movement,              coughing, and talking. High Risk Factors: Age > 50 yrs.  Comparison Study: No prior study on file Performing Technologist: Candace  Rosezetta Schlatter RVS  Examination Guidelines: Patient in reclined position. 2D, color and spectral doppler sampling in the temporal artery along the hairline and temple in the longitudinal plane. 2D images along the hairline and temple in the transverse plane. Exam is bilateral.  ++-------------------+------------------+ Right Halo POSITIVELeft Halo POSITIVE ++-------------------+------------------+ +--------+----------+-----------+----------+-----------+         Width (cm)Length (cm)Width (cm)Length (cm) +--------+----------+-----------+----------+-----------+ Proximal0.15                 0.13                  +--------+----------+-----------+----------+-----------+ Mid     0.14                 0.10                  +--------+----------+-----------+----------+-----------+ Distal  0.13                 0.06                  +--------+----------+-----------+----------+-----------+ Technically difficult and limited study, see indications above. No compression of bilateral temporal arteries at proximal, mid, or distal segments.  Abnormal flow noted throughout. 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  *See table(s) above for measurements and observations.  Suggest Peripheral Vascular Consult.   Preliminary    CT HEAD WO CONTRAST ( )  Result Date: 08/13/2022 CLINICAL DATA:  Initial evaluation for headache, neuro deficit. EXAM: CT HEAD WITHOUT CONTRAST TECHNIQUE: Contiguous axial images were obtained from the base of the skull through the vertex without intravenous contrast. RADIATION DOSE REDUCTION: This exam was performed according to the departmental dose-optimization program which includes automated exposure control, adjustment of the mA and/or kV according to patient size and/or use of iterative reconstruction technique. COMPARISON:  Prior brain MRI performed earlier the same day. FINDINGS: Brain: Mild age-related cerebral atrophy with chronic small vessel ischemic disease. Remote infarcts involving the right basal ganglia, left thalamus, and left cerebellum. No acute intracranial hemorrhage. No acute large vessel territory infarct. No mass lesion or midline shift. No hydrocephalus or extra-axial fluid collection. Vascular: No abnormal hyperdense vessel. Calcified atherosclerosis present at skull base. Skull: Scalp soft tissues demonstrate no acute finding. Calvarium intact. Sinuses/Orbits: Globes and orbital soft tissues within normal limits. Paranasal sinuses and mastoid air cells are largely clear. Other: None. IMPRESSION: 1. No acute intracranial abnormality. 2. Remote infarcts involving the right basal ganglia, left thalamus, and left cerebellum. 3. Mild age-related cerebral atrophy with chronic small vessel ischemic disease. Electronically Signed   By: Rise Mu M.D.   On: 08/13/2022 20:46   MR BRAIN WO CONTRAST  Result Date: 08/13/2022 CLINICAL DATA:  Initial evaluation for headache, neuro deficit. EXAM: MRI HEAD WITHOUT CONTRAST MRA HEAD WITHOUT CONTRAST  MRA NECK WITHOUT CONTRAST TECHNIQUE: Multiplanar, multiecho pulse sequences of the brain and surrounding structures were obtained without intravenous contrast. Angiographic images of the Circle of Willis were obtained using MRA technique without intravenous contrast. Angiographic images of the neck were obtained using MRA technique without intravenous contrast. Carotid stenosis measurements (when applicable) are obtained utilizing NASCET criteria, using the distal internal carotid diameter as the denominator. COMPARISON:  None Available. FINDINGS: MRI HEAD FINDINGS Brain: Cerebral volume within normal limits. Mild chronic microvascular ischemic disease for age. Remote lacunar infarct present at the anterior right basal ganglia. Additional small remote left cerebellar infarct. Associated chronic blood products noted about these chronic infarcts. Additional chronic lacunar infarct  present at the left thalamocapsular region. No evidence for acute or subacute ischemia. Gray-white matter differentiation maintained. No acute intracranial hemorrhage. Innumerable punctate chronic micro hemorrhages noted involving both cerebral hemispheres, overall pattern nonspecific. No mass lesion, midline shift or mass effect. No hydrocephalus or extra-axial fluid collection. Pituitary gland and suprasellar region within normal limits. Vascular: Major intracranial vascular flow voids are maintained. Skull and upper cervical spine: Cranial junction within normal limits. Bone marrow signal intensity normal. No scalp soft tissue abnormality. Sinuses/Orbits: Prior bilateral ocular lens replacement. Paranasal sinuses are largely clear. Trace right mastoid effusion noted to left of significance. Other: None. MRA HEAD FINDINGS ANTERIOR CIRCULATION: Visualized distal cervical segments of both internal carotid arteries are patent with antegrade flow. Atheromatous irregularity about the cavernous ICAs bilaterally without hemodynamically  significant stenosis. 2 mm outpouching extending anteriorly from the cavernous right ICA consistent with a small aneurysm (series 5, image 77). Additional 3 mm outpouching extending posteriorly from the cavernous left ICA also suspicious for aneurysm (series 5, image 76). 3 mm outpouching extending inferiorly from the supraclinoid left ICA could reflect an additional small aneurysm (series 5, image 87). A1 segments patent bilaterally. Normal anterior communicating complex. Anterior cerebral arteries widely patent. No M1 stenosis or occlusion. 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 Distal MCA branches perfused and symmetric. POSTERIOR CIRCULATION: Both V4 segments are patent without stenosis. Right vertebral artery dominant. Right PICA patent at its origin. Left PICA origin not seen. Basilar patent without stenosis. Superior cerebellar and posterior cerebral arteries widely patent bilaterally. MRA NECK FINDINGS AORTIC ARCH: Visualized aortic arch within normal limits for caliber with standard branch pattern. No visible stenosis about the origin of the great vessels. RIGHT CAROTID SYSTEM: Right common and internal carotid arteries are patent with antegrade flow. No evidence for dissection. Atheromatous irregularity about the right carotid bulb without hemodynamically significant greater than 50% stenosis. No ulceration. LEFT CAROTID SYSTEM: Left common and internal carotid arteries are patent with antegrade flow. No evidence for dissection. Atheromatous irregularity about the left carotid bulb without hemodynamically significant greater than 50% stenosis. No ulceration. VERTEBRAL ARTERIES: Both vertebral arteries arise from the subclavian arteries. No visible proximal subclavian artery stenosis. Right vertebral artery dominant. Vertebral arteries are patent with antegrade flow. No dissection or stenosis. IMPRESSION: MRI HEAD IMPRESSION: 1. No acute intracranial abnormality. 2. Chronic  ischemic infarcts involving the right basal ganglia, left thalamocapsular region, and left cerebellum. 3. Innumerable punctate chronic micro hemorrhages involving both cerebral hemispheres. Overall pattern is nonspecific, with differential considerations including sequelae of prior cardiac bypass, chronic hypertension, cerebral amyloid angiopathy, prior trauma, or possibly vasculitis. MRA HEAD IMPRESSION: 1. Negative intracranial MRA for large vessel occlusion. No hemodynamically significant or correctable stenosis. 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 5. 3 mm supraclinoid left ICA aneurysm. MRA NECK IMPRESSION: 1. No acute vascular abnormality within the neck. 2. Mild atheromatous irregularity about both carotid artery systems within the neck without hemodynamically significant greater than 50% stenosis. 3. Patent vertebral arteries within the neck. Right vertebral artery dominant. Electronically Signed   By: Rise Mu M.D.   On: 08/13/2022 20:43   MR ANGIO HEAD WO CONTRAST  Result Date: 08/13/2022 CLINICAL DATA:  Initial evaluation for headache, neuro deficit. EXAM: MRI HEAD WITHOUT CONTRAST MRA HEAD WITHOUT CONTRAST MRA NECK WITHOUT CONTRAST TECHNIQUE: Multiplanar, multiecho pulse sequences of the brain and surrounding structures were obtained without  intravenous contrast. Angiographic images of the Circle of Willis were obtained using MRA technique without intravenous contrast. Angiographic images of the neck were obtained using MRA technique without intravenous contrast. Carotid stenosis measurements (when applicable) are obtained utilizing NASCET criteria, using the distal internal carotid diameter as the denominator. COMPARISON:  None Available. FINDINGS: MRI HEAD FINDINGS Brain: Cerebral volume within normal limits. Mild chronic microvascular ischemic disease for age. Remote lacunar infarct present at the anterior  right basal ganglia. Additional small remote left cerebellar infarct. Associated chronic blood products noted about these chronic infarcts. Additional chronic lacunar infarct present at the left thalamocapsular region. No evidence for acute or subacute ischemia. Gray-white matter differentiation maintained. No acute intracranial hemorrhage. Innumerable punctate chronic micro hemorrhages noted involving both cerebral hemispheres, overall pattern nonspecific. No mass lesion, midline shift or mass effect. No hydrocephalus or extra-axial fluid collection. Pituitary gland and suprasellar region within normal limits. Vascular: Major intracranial vascular flow voids are maintained. Skull and upper cervical spine: Cranial junction within normal limits. Bone marrow signal intensity normal. No scalp soft tissue abnormality. Sinuses/Orbits: Prior bilateral ocular lens replacement. Paranasal sinuses are largely clear. Trace right mastoid effusion noted to left of significance. Other: None. MRA HEAD FINDINGS ANTERIOR CIRCULATION: Visualized distal cervical segments of both internal carotid arteries are patent with antegrade flow. Atheromatous irregularity about the cavernous ICAs bilaterally without hemodynamically significant stenosis. 2 mm outpouching extending anteriorly from the cavernous right ICA consistent with a small aneurysm (series 5, image 77). Additional 3 mm outpouching extending posteriorly from the cavernous left ICA also suspicious for aneurysm (series 5, image 76). 3 mm outpouching extending inferiorly from the supraclinoid left ICA could reflect an additional small aneurysm (series 5, image 87). A1 segments patent bilaterally. Normal anterior communicating complex. Anterior cerebral arteries widely patent. No M1 stenosis or occlusion. 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 Distal MCA branches perfused and symmetric. POSTERIOR CIRCULATION: Both V4 segments are patent without  stenosis. Right vertebral artery dominant. Right PICA patent at its origin. Left PICA origin not seen. Basilar patent without stenosis. Superior cerebellar and posterior cerebral arteries widely patent bilaterally. MRA NECK FINDINGS AORTIC ARCH: Visualized aortic arch within normal limits for caliber with standard branch pattern. No visible stenosis about the origin of the great vessels. RIGHT CAROTID SYSTEM: Right common and internal carotid arteries are patent with antegrade flow. No evidence for dissection. Atheromatous irregularity about the right carotid bulb without hemodynamically significant greater than 50% stenosis. No ulceration. LEFT CAROTID SYSTEM: Left common and internal carotid arteries are patent with antegrade flow. No evidence for dissection. Atheromatous irregularity about the left carotid bulb without hemodynamically significant greater than 50% stenosis. No ulceration. VERTEBRAL ARTERIES: Both vertebral arteries arise from the subclavian arteries. No visible proximal subclavian artery stenosis. Right vertebral artery dominant. Vertebral arteries are patent with antegrade flow. No dissection or stenosis. IMPRESSION: MRI HEAD IMPRESSION: 1. No acute intracranial abnormality. 2. 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 3. Innumerable punctate chronic micro hemorrhages involving both cerebral hemispheres. Overall pattern is nonspecific, with differential considerations including sequelae of prior cardiac bypass, chronic hypertension, cerebral amyloid angiopathy, prior trauma, or possibly vasculitis. MRA HEAD IMPRESSION: 1. Negative intracranial MRA for large vessel occlusion. No hemodynamically significant or correctable stenosis. 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 5. 3 mm supraclinoid left ICA aneurysm. MRA NECK IMPRESSION:  1. No acute vascular  abnormality within the neck. 2. Mild atheromatous irregularity about both carotid artery systems within the neck without hemodynamically significant greater than 50% stenosis. 3. Patent vertebral arteries within the neck. Right vertebral artery dominant. Electronically Signed   By: Rise Mu M.D.   On: 08/13/2022 20:43   MR ANGIO NECK WO CONTRAST  Result Date: 08/13/2022 CLINICAL DATA:  Initial evaluation for headache, neuro deficit. EXAM: MRI HEAD WITHOUT CONTRAST MRA HEAD WITHOUT CONTRAST MRA NECK WITHOUT CONTRAST TECHNIQUE: Multiplanar, multiecho pulse sequences of the brain and surrounding structures were obtained without intravenous contrast. Angiographic images of the Circle of Willis were obtained using MRA technique without intravenous contrast. Angiographic images of the neck were obtained using MRA technique without intravenous contrast. Carotid stenosis measurements (when applicable) are obtained utilizing NASCET criteria, using the distal internal carotid diameter as the denominator. COMPARISON:  None Available. FINDINGS: MRI HEAD FINDINGS Brain: Cerebral volume within normal limits. Mild chronic microvascular ischemic disease for age. Remote lacunar infarct present at the anterior right basal ganglia. Additional small remote left cerebellar infarct. Associated chronic blood products noted about these chronic infarcts. Additional chronic lacunar infarct present at the left thalamocapsular region. No evidence for acute or subacute ischemia. Gray-white matter differentiation maintained. No acute intracranial hemorrhage. Innumerable punctate chronic micro hemorrhages noted involving both cerebral hemispheres, overall pattern nonspecific. No mass lesion, midline shift or mass effect. No hydrocephalus or extra-axial fluid collection. Pituitary gland and suprasellar region within normal limits. Vascular: Major intracranial vascular flow voids are maintained. Skull and upper cervical spine:  Cranial junction within normal limits. Bone marrow signal intensity normal. No scalp soft tissue abnormality. Sinuses/Orbits: Prior bilateral ocular lens replacement. Paranasal sinuses are largely clear. Trace right mastoid effusion noted to left of significance. Other: None. MRA HEAD FINDINGS ANTERIOR CIRCULATION: Visualized distal cervical segments of both internal carotid arteries are patent with antegrade flow. Atheromatous irregularity about the cavernous ICAs bilaterally without hemodynamically significant stenosis. 2 mm outpouching extending anteriorly from the cavernous right ICA consistent with a small aneurysm (series 5, image 77). Additional 3 mm outpouching extending posteriorly from the cavernous left ICA also suspicious for aneurysm (series 5, image 76). 3 mm outpouching extending inferiorly from the supraclinoid left ICA could reflect an additional small aneurysm (series 5, image 87). A1 segments patent bilaterally. Normal anterior communicating complex. Anterior cerebral arteries widely patent. No M1 stenosis or occlusion. 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 Distal MCA branches perfused and symmetric. POSTERIOR CIRCULATION: Both V4 segments are patent without stenosis. Right vertebral artery dominant. Right PICA patent at its origin. Left PICA origin not seen. Basilar patent without stenosis. Superior cerebellar and posterior cerebral arteries widely patent bilaterally. MRA NECK FINDINGS AORTIC ARCH: Visualized aortic arch within normal limits for caliber with standard branch pattern. No visible stenosis about the origin of the great vessels. RIGHT CAROTID SYSTEM: Right common and internal carotid arteries are patent with antegrade flow. No evidence for dissection. Atheromatous irregularity about the right carotid bulb without hemodynamically significant greater than 50% stenosis. No ulceration. LEFT CAROTID SYSTEM: Left common and internal carotid arteries are  patent with antegrade flow. No evidence for dissection. Atheromatous irregularity about the left carotid bulb without hemodynamically significant greater than 50% stenosis. No ulceration. VERTEBRAL ARTERIES: Both vertebral arteries arise from the subclavian arteries. No visible proximal subclavian artery stenosis. Right vertebral artery dominant. Vertebral arteries are patent with antegrade flow. No dissection or stenosis. IMPRESSION: MRI HEAD IMPRESSION: 1.  No acute intracranial abnormality. 2. 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 3. Innumerable punctate chronic micro hemorrhages involving both cerebral hemispheres. Overall pattern is nonspecific, with differential considerations including sequelae of prior cardiac bypass, chronic hypertension, cerebral amyloid angiopathy, prior trauma, or possibly vasculitis. MRA HEAD IMPRESSION: 1. Negative intracranial MRA for large vessel occlusion. No hemodynamically significant or correctable stenosis. 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 5. 3 mm supraclinoid left ICA aneurysm. MRA NECK IMPRESSION: 1. No acute vascular abnormality within the neck. 2. Mild atheromatous irregularity about both carotid artery systems within the neck without hemodynamically significant greater than 50% stenosis. 3. Patent vertebral arteries within the neck. Right vertebral artery dominant. Electronically Signed   By: Rise Mu M.D.   On: 08/13/2022 20:43      Signature  -   Susa Raring M.D on 08/15/2022 at 9:35 AM   -  To page go to [email or web address]

## 2022-08-15 NOTE — Progress Notes (Signed)
Progress Note  Patient Name: Donald Rios Date of Encounter: 08/15/2022  Primary Cardiologist: Chrystie Nose, MD  Subjective   No acute events overnight. No symptoms of chest pain or SOB.  Inpatient Medications    Scheduled Meds:  apixaban  5 mg Oral BID   aspirin  81 mg Oral Daily   docusate sodium  100 mg Oral BID   doxazosin  2 mg Oral Daily   insulin aspart  0-5 Units Subcutaneous QHS   insulin aspart  0-9 Units Subcutaneous TID WC   metoprolol tartrate  75 mg Oral Q6H   mirtazapine  15 mg Oral QHS   pantoprazole  40 mg Oral Daily   rosuvastatin  20 mg Oral Daily   Continuous Infusions:  sodium chloride Stopped (08/15/22 1104)   methylPREDNISolone (SOLU-MEDROL) injection 1,000 mg (08/15/22 0845)   PRN Meds: sodium chloride, acetaminophen **OR** acetaminophen, diltiazem, LORazepam, ondansetron **OR** ondansetron (ZOFRAN) IV   Vital Signs    Vitals:   08/14/22 2143 08/14/22 2338 08/15/22 0236 08/15/22 1102  BP: (!) 122/50 134/65 138/72 131/63  Pulse: 73 73 65 74  Resp:  18 20   Temp:  98.2 F (36.8 C) 98.5 F (36.9 C)   TempSrc:  Oral Oral   SpO2:  100% 97%   Weight:      Height:        Intake/Output Summary (Last 24 hours) at 08/15/2022 1206 Last data filed at 08/15/2022 1105 Gross per 24 hour  Intake 1420.64 ml  Output 2000 ml  Net -579.36 ml   Filed Weights   08/13/22 1430 08/13/22 2317  Weight: 68 kg 65.7 kg    Telemetry     Personally reviewed, Afib, rate controlled  ECG    Not performed today  Physical Exam   GEN: No acute distress.   Neck: No JVD. Cardiac: RRR, no murmur, rub, or gallop.  Respiratory: Nonlabored. Clear to auscultation bilaterally. GI: Soft, nontender, bowel sounds present. MS: No edema; No deformity. Neuro:  Nonfocal. Psych: Alert and oriented x 3. Normal affect.  Labs    Chemistry Recent Labs  Lab 08/13/22 1340 08/14/22 0317 08/15/22 0241  NA 140 137 138  K 3.9 4.3 3.9  CL 106 104 105  CO2 23 22 23    GLUCOSE 169* 177* 173*  BUN 10 13 15   CREATININE 1.13 1.12 1.05  CALCIUM 8.9 8.7* 8.9  PROT  --  5.6* 5.9*  ALBUMIN  --  2.9* 3.1*  AST  --  14* 17  ALT  --  14 15  ALKPHOS  --  50 53  BILITOT  --  1.2 1.0  GFRNONAA >60 >60 >60  ANIONGAP 11 11 10      Hematology Recent Labs  Lab 08/13/22 1340 08/14/22 0317 08/15/22 0241  WBC 7.0 4.1 3.5*  RBC 3.13* 2.93* 2.97*  HGB 9.5* 9.4* 9.1*  HCT 30.7* 29.1* 27.8*  MCV 98.1 99.3 93.6  MCH 30.4 32.1 30.6  MCHC 30.9 32.3 32.7  RDW 15.4 15.2 15.0  PLT 274 183 168    Cardiac EnzymesNo results for input(s): "TROPONINIHS" in the last 720 hours.  BNP Recent Labs  Lab 08/14/22 0317 08/15/22 0241  BNP 690.7* 751.5*     DDimerNo results for input(s): "DDIMER" in the last 168 hours.   Radiology    ECHOCARDIOGRAM COMPLETE  Result Date: 08/14/2022    ECHOCARDIOGRAM REPORT   Patient Name:   Donald Rios Date of Exam: 08/14/2022 Medical Rec #:  [id number]      Height:       68.0 in Accession #:    [id number]     Weight:       144.9 lb Date of Birth:  [birth year]      BSA:          1.782 m Patient Age:    81 years       BP:           111/75 mmHg Patient Gender: M              HR:           107 bpm. Exam Location:  Inpatient Procedure: 2D Echo, Cardiac Doppler, Color Doppler and Intracardiac            Opacification Agent Indications:    CHF-Acute Diastolic I50.31  History:        Patient has prior history of Echocardiogram examinations, most                 recent 10/02/2019. Cardiomyopathy and CHF, CAD, Prior CABG; Risk                 Factors:Hypertension, Diabetes and Dyslipidemia.  Sonographer:    Lucendia Herrlich Referring Phys: Effie Shy PRASHANT K [facility] IMPRESSIONS  1. Left ventricular ejection fraction, by estimation, is 45 to 50%. The left ventricle has mildly decreased function. The left ventricle demonstrates global hypokinesis. There is mild left ventricular hypertrophy. Left ventricular diastolic function could not be evaluated. Elevated left  ventricular end-diastolic pressure.  2. Right ventricular systolic function is normal. The right ventricular size is normal. There is severely elevated pulmonary artery systolic pressure. The estimated right ventricular systolic pressure is 62.2 mmHg.  3. Left atrial size was severely dilated.  4. The mitral valve is normal in structure. Mild to moderate mitral valve regurgitation. No evidence of mitral stenosis.  5. The tricuspid valve is abnormal. Tricuspid valve regurgitation is mild to moderate.  6. The aortic valve is tricuspid. Aortic valve regurgitation is not visualized. No aortic stenosis is present.  7. The inferior vena cava is dilated in size with >50% respiratory variability, suggesting right atrial pressure of 8 mmHg. Comparison(s): Changes from prior study are noted. LVEF worsened to 45-50% from 60-65% in 2021. FINDINGS  Left Ventricle: Left ventricular ejection fraction, by estimation, is 45 to 50%. The left ventricle has mildly decreased function. The left ventricle demonstrates global hypokinesis. Definity contrast agent was given IV to delineate the left ventricular  endocardial borders. The left ventricular internal cavity size was normal in size. There is mild left ventricular hypertrophy. Left ventricular diastolic function could not be evaluated due to atrial fibrillation. Left ventricular diastolic function could not be evaluated. Elevated left ventricular end-diastolic pressure. Right Ventricle: The right ventricular size is normal. No increase in right ventricular wall thickness. Right ventricular systolic function is normal. There is severely elevated pulmonary artery systolic pressure. The tricuspid regurgitant velocity is 3.68 m/s, and with an assumed right atrial pressure of 8 mmHg, the estimated right ventricular systolic pressure is 62.2 mmHg. Left Atrium: Left atrial size was severely dilated. Right Atrium: Right atrial size was normal in size. Pericardium: There is no evidence of  pericardial effusion. Mitral Valve: The mitral valve is normal in structure. Mild to moderate mitral valve regurgitation. No evidence of mitral valve stenosis. Tricuspid Valve: The tricuspid valve is abnormal. Tricuspid valve regurgitation is mild to moderate. No evidence of tricuspid stenosis. Aortic Valve: The aortic valve  is tricuspid. Aortic valve regurgitation is not visualized. No aortic stenosis is present. Aortic valve peak gradient measures 7.6 mmHg. Pulmonic Valve: The pulmonic valve was normal in structure. Pulmonic valve regurgitation is trivial. No evidence of pulmonic stenosis. Aorta: The aortic root and ascending aorta are structurally normal, with no evidence of dilitation. Venous: The inferior vena cava is dilated in size with greater than 50% respiratory variability, suggesting right atrial pressure of 8 mmHg. IAS/Shunts: No atrial level shunt detected by color flow Doppler.  LEFT VENTRICLE PLAX 2D LVIDd:         4.50 cm   Diastology LVIDs:         3.70 cm   LV e' medial:    6.96 cm/s LV PW:         1.25 cm   LV E/e' medial:  20.0 LV IVS:        1.20 cm   LV e' lateral:   10.90 cm/s LVOT diam:     2.20 cm   LV E/e' lateral: 12.8 LV SV:         56 LV SV Index:   31 LVOT Area:     3.80 cm  RIGHT VENTRICLE            IVC RV S prime:     9.90 cm/s  IVC diam: 2.40 cm TAPSE (M-mode): 1.5 cm LEFT ATRIUM             Index        RIGHT ATRIUM           Index LA diam:        5.90 cm 3.31 cm/m   RA Area:     20.10 cm LA Vol (A2C):   85.5 ml 47.98 ml/m  RA Volume:   52.70 ml  29.57 ml/m LA Vol (A4C):   89.6 ml 50.28 ml/m LA Biplane Vol: 90.0 ml 50.50 ml/m  AORTIC VALVE                 PULMONIC VALVE AV Area (Vmax): 2.59 cm     PR End Diast Vel: 6.81 msec AV Vmax:        138.00 cm/s AV Peak Grad:   7.6 mmHg LVOT Vmax:      94.10 cm/s LVOT Vmean:     63.233 cm/s LVOT VTI:       0.148 m  AORTA Ao Root diam: 3.20 cm Ao Asc diam:  2.20 cm MITRAL VALVE                TRICUSPID VALVE MV Area (PHT): 4.96 cm      TR Peak grad:   54.2 mmHg MV Decel Time: 153 msec     TR Vmax:        368.00 cm/s MR Peak grad: 114.9 mmHg MR Vmax:      536.00 cm/s   SHUNTS MV E velocity: 139.00 cm/s  Systemic VTI:  0.15 m                             Systemic Diam: 2.20 cm  Priya  Electronically signed by Winfield Rast  Signature Date/Time: 08/14/2022/11:25:17 AM    Final    VAS [place] TEMPORAL ARTERY BILATERAL  Result Date: 08/14/2022  TEMPORAL ARTERY REPORT Patient Name:  DONEVAN BILLER  Date of Exam:   08/14/2022 Medical Rec #: [id number]       Accession #:  [id number] Date of Birth: April 02, 1940       Patient Gender: M Patient Age:   29 years Exam Location:  [facility] Procedure:      VAS [place] TEMPORAL ARTERY BILATERAL Referring Phys: MCNEILL KIRKPATRICK --------------------------------------------------------------------------------  Indications: Jaw pain, blurred vision , abnormal lab values and Ophthalmologist              noted edema of disc, sent patient for Labs, ESR and CRP elevated.              This study was technically difficult secondary to patient movement,              coughing, and talking. High Risk Factors: Age > 50 yrs.  Comparison Study: No prior study on file Performing Technologist: Sherren Kerns RVS  Examination Guidelines: Patient in reclined position. 2D, color and spectral doppler sampling in the temporal artery along the hairline and temple in the longitudinal plane. 2D images along the hairline and temple in the transverse plane. Exam is bilateral.  ++-------------------+------------------+ Right Halo POSITIVELeft Halo POSITIVE ++-------------------+------------------+ +--------+----------+-----------+----------+-----------+         Width (cm)Length (cm)Width (cm)Length (cm) +--------+----------+-----------+----------+-----------+ Proximal0.15                 0.13                  +--------+----------+-----------+----------+-----------+ Mid     0.14                  0.10                  +--------+----------+-----------+----------+-----------+ Distal  0.13                 0.06                  +--------+----------+-----------+----------+-----------+ Technically difficult and limited study, see indications above. No compression of bilateral temporal arteries at proximal, mid, or distal segments. Abnormal flow noted throughout. 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  *See table(s) above for measurements and observations.  Suggest Peripheral Vascular Consult.   Preliminary    CT HEAD WO CONTRAST ( )  Result Date: 08/13/2022 CLINICAL DATA:  Initial evaluation for headache, neuro deficit. EXAM: CT HEAD WITHOUT CONTRAST TECHNIQUE: Contiguous axial images were obtained from the base of the skull through the vertex without intravenous contrast. RADIATION DOSE REDUCTION: This exam was performed according to the departmental dose-optimization program which includes automated exposure control, adjustment of the mA and/or kV according to patient size and/or use of iterative reconstruction technique. COMPARISON:  Prior brain MRI performed earlier the same day. FINDINGS: Brain: Mild age-related cerebral atrophy with chronic small vessel ischemic disease. Remote infarcts involving the right basal ganglia, left thalamus, and left cerebellum. No acute intracranial hemorrhage. No acute large vessel territory infarct. No mass lesion or midline shift. No hydrocephalus or extra-axial fluid collection. Vascular: No abnormal hyperdense vessel. Calcified atherosclerosis present at skull base. Skull: Scalp soft tissues demonstrate no acute finding. Calvarium intact. Sinuses/Orbits: Globes and orbital soft tissues within normal limits. Paranasal sinuses and mastoid air cells are largely clear. Other: None. IMPRESSION: 1. No acute intracranial abnormality. 2. Remote infarcts involving the right basal ganglia, left thalamus, and left  cerebellum. 3. Mild age-related cerebral atrophy with chronic small vessel ischemic disease. Electronically Signed   By: Rise Mu M.D.   On: 08/13/2022 20:46   MR BRAIN WO CONTRAST  Result Date: 08/13/2022  CLINICAL DATA:  Initial evaluation for headache, neuro deficit. EXAM: MRI HEAD WITHOUT CONTRAST MRA HEAD WITHOUT CONTRAST MRA NECK WITHOUT CONTRAST TECHNIQUE: Multiplanar, multiecho pulse sequences of the brain and surrounding structures were obtained without intravenous contrast. Angiographic images of the Circle of Willis were obtained using MRA technique without intravenous contrast. Angiographic images of the neck were obtained using MRA technique without intravenous contrast. Carotid stenosis measurements (when applicable) are obtained utilizing NASCET criteria, using the distal internal carotid diameter as the denominator. COMPARISON:  None Available. FINDINGS: MRI HEAD FINDINGS Brain: Cerebral volume within normal limits. Mild chronic microvascular ischemic disease for age. Remote lacunar infarct present at the anterior right basal ganglia. Additional small remote left cerebellar infarct. Associated chronic blood products noted about these chronic infarcts. Additional chronic lacunar infarct present at the left thalamocapsular region. No evidence for acute or subacute ischemia. Gray-white matter differentiation maintained. No acute intracranial hemorrhage. Innumerable punctate chronic micro hemorrhages noted involving both cerebral hemispheres, overall pattern nonspecific. No mass lesion, midline shift or mass effect. No hydrocephalus or extra-axial fluid collection. Pituitary gland and suprasellar region within normal limits. Vascular: Major intracranial vascular flow voids are maintained. Skull and upper cervical spine: Cranial junction within normal limits. Bone marrow signal intensity normal. No scalp soft tissue abnormality. Sinuses/Orbits: Prior bilateral ocular lens replacement.  Paranasal sinuses are largely clear. Trace right mastoid effusion noted to left of significance. Other: None. MRA HEAD FINDINGS ANTERIOR CIRCULATION: Visualized distal cervical segments of both internal carotid arteries are patent with antegrade flow. Atheromatous irregularity about the cavernous ICAs bilaterally without hemodynamically significant stenosis. 2 mm outpouching extending anteriorly from the cavernous right ICA consistent with a small aneurysm (series 5, image 77). Additional 3 mm outpouching extending posteriorly from the cavernous left ICA also suspicious for aneurysm (series 5, image 76). 3 mm outpouching extending inferiorly from the supraclinoid left ICA could reflect an additional small aneurysm (series 5, image 87). A1 segments patent bilaterally. Normal anterior communicating complex. Anterior cerebral arteries widely patent. No M1 stenosis or occlusion. 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 Distal MCA branches perfused and symmetric. POSTERIOR CIRCULATION: Both V4 segments are patent without stenosis. Right vertebral artery dominant. Right PICA patent at its origin. Left PICA origin not seen. Basilar patent without stenosis. Superior cerebellar and posterior cerebral arteries widely patent bilaterally. MRA NECK FINDINGS AORTIC ARCH: Visualized aortic arch within normal limits for caliber with standard branch pattern. No visible stenosis about the origin of the great vessels. RIGHT CAROTID SYSTEM: Right common and internal carotid arteries are patent with antegrade flow. No evidence for dissection. Atheromatous irregularity about the right carotid bulb without hemodynamically significant greater than 50% stenosis. No ulceration. LEFT CAROTID SYSTEM: Left common and internal carotid arteries are patent with antegrade flow. No evidence for dissection. Atheromatous irregularity about the left carotid bulb without hemodynamically significant greater than 50%  stenosis. No ulceration. VERTEBRAL ARTERIES: Both vertebral arteries arise from the subclavian arteries. No visible proximal subclavian artery stenosis. Right vertebral artery dominant. Vertebral arteries are patent with antegrade flow. No dissection or stenosis. IMPRESSION: MRI HEAD IMPRESSION: 1. No acute intracranial abnormality. 2. 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 3. Innumerable punctate chronic micro hemorrhages involving both cerebral hemispheres. Overall pattern is nonspecific, with differential considerations including sequelae of prior cardiac bypass, chronic hypertension, cerebral amyloid angiopathy, prior trauma, or possibly vasculitis. MRA HEAD IMPRESSION: 1. Negative intracranial MRA for large vessel occlusion. No  hemodynamically significant or correctable stenosis. 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 5. 3 mm supraclinoid left ICA aneurysm. MRA NECK IMPRESSION: 1. No acute vascular abnormality within the neck. 2. Mild atheromatous irregularity about both carotid artery systems within the neck without hemodynamically significant greater than 50% stenosis. 3. Patent vertebral arteries within the neck. Right vertebral artery dominant. Electronically Signed   By: Rise Mu M.D.   On: 08/13/2022 20:43   MR ANGIO HEAD WO CONTRAST  Result Date: 08/13/2022 CLINICAL DATA:  Initial evaluation for headache, neuro deficit. EXAM: MRI HEAD WITHOUT CONTRAST MRA HEAD WITHOUT CONTRAST MRA NECK WITHOUT CONTRAST TECHNIQUE: Multiplanar, multiecho pulse sequences of the brain and surrounding structures were obtained without intravenous contrast. Angiographic images of the Circle of Willis were obtained using MRA technique without intravenous contrast. Angiographic images of the neck were obtained using MRA technique without intravenous contrast. Carotid  stenosis measurements (when applicable) are obtained utilizing NASCET criteria, using the distal internal carotid diameter as the denominator. COMPARISON:  None Available. FINDINGS: MRI HEAD FINDINGS Brain: Cerebral volume within normal limits. Mild chronic microvascular ischemic disease for age. Remote lacunar infarct present at the anterior right basal ganglia. Additional small remote left cerebellar infarct. Associated chronic blood products noted about these chronic infarcts. Additional chronic lacunar infarct present at the left thalamocapsular region. No evidence for acute or subacute ischemia. Gray-white matter differentiation maintained. No acute intracranial hemorrhage. Innumerable punctate chronic micro hemorrhages noted involving both cerebral hemispheres, overall pattern nonspecific. No mass lesion, midline shift or mass effect. No hydrocephalus or extra-axial fluid collection. Pituitary gland and suprasellar region within normal limits. Vascular: Major intracranial vascular flow voids are maintained. Skull and upper cervical spine: Cranial junction within normal limits. Bone marrow signal intensity normal. No scalp soft tissue abnormality. Sinuses/Orbits: Prior bilateral ocular lens replacement. Paranasal sinuses are largely clear. Trace right mastoid effusion noted to left of significance. Other: None. MRA HEAD FINDINGS ANTERIOR CIRCULATION: Visualized distal cervical segments of both internal carotid arteries are patent with antegrade flow. Atheromatous irregularity about the cavernous ICAs bilaterally without hemodynamically significant stenosis. 2 mm outpouching extending anteriorly from the cavernous right ICA consistent with a small aneurysm (series 5, image 77). Additional 3 mm outpouching extending posteriorly from the cavernous left ICA also suspicious for aneurysm (series 5, image 76). 3 mm outpouching extending inferiorly from the supraclinoid left ICA could reflect an additional small  aneurysm (series 5, image 87). A1 segments patent bilaterally. Normal anterior communicating complex. Anterior cerebral arteries widely patent. No M1 stenosis or occlusion. 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 Distal MCA branches perfused and symmetric. POSTERIOR CIRCULATION: Both V4 segments are patent without stenosis. Right vertebral artery dominant. Right PICA patent at its origin. Left PICA origin not seen. Basilar patent without stenosis. Superior cerebellar and posterior cerebral arteries widely patent bilaterally. MRA NECK FINDINGS AORTIC ARCH: Visualized aortic arch within normal limits for caliber with standard branch pattern. No visible stenosis about the origin of the great vessels. RIGHT CAROTID SYSTEM: Right common and internal carotid arteries are patent with antegrade flow. No evidence for dissection. Atheromatous irregularity about the right carotid bulb without hemodynamically significant greater than 50% stenosis. No ulceration. LEFT CAROTID SYSTEM: Left common and internal carotid arteries are patent with antegrade flow. No evidence for dissection. Atheromatous irregularity about the left carotid bulb without hemodynamically significant greater than 50% stenosis. No ulceration. VERTEBRAL  ARTERIES: Both vertebral arteries arise from the subclavian arteries. No visible proximal subclavian artery stenosis. Right vertebral artery dominant. Vertebral arteries are patent with antegrade flow. No dissection or stenosis. IMPRESSION: MRI HEAD IMPRESSION: 1. No acute intracranial abnormality. 2. 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 3. Innumerable punctate chronic micro hemorrhages involving both cerebral hemispheres. Overall pattern is nonspecific, with differential considerations including sequelae of prior cardiac bypass, chronic hypertension, cerebral amyloid angiopathy, prior trauma, or possibly  vasculitis. MRA HEAD IMPRESSION: 1. Negative intracranial MRA for large vessel occlusion. No hemodynamically significant or correctable stenosis. 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 5. 3 mm supraclinoid left ICA aneurysm. MRA NECK IMPRESSION: 1. No acute vascular abnormality within the neck. 2. Mild atheromatous irregularity about both carotid artery systems within the neck without hemodynamically significant greater than 50% stenosis. 3. Patent vertebral arteries within the neck. Right vertebral artery dominant. Electronically Signed   By: Rise Mu M.D.   On: 08/13/2022 20:43   MR ANGIO NECK WO CONTRAST  Result Date: 08/13/2022 CLINICAL DATA:  Initial evaluation for headache, neuro deficit. EXAM: MRI HEAD WITHOUT CONTRAST MRA HEAD WITHOUT CONTRAST MRA NECK WITHOUT CONTRAST TECHNIQUE: Multiplanar, multiecho pulse sequences of the brain and surrounding structures were obtained without intravenous contrast. Angiographic images of the Circle of Willis were obtained using MRA technique without intravenous contrast. Angiographic images of the neck were obtained using MRA technique without intravenous contrast. Carotid stenosis measurements (when applicable) are obtained utilizing NASCET criteria, using the distal internal carotid diameter as the denominator. COMPARISON:  None Available. FINDINGS: MRI HEAD FINDINGS Brain: Cerebral volume within normal limits. Mild chronic microvascular ischemic disease for age. Remote lacunar infarct present at the anterior right basal ganglia. Additional small remote left cerebellar infarct. Associated chronic blood products noted about these chronic infarcts. Additional chronic lacunar infarct present at the left thalamocapsular region. No evidence for acute or subacute ischemia. Gray-white matter differentiation maintained. No acute intracranial hemorrhage. Innumerable punctate chronic micro  hemorrhages noted involving both cerebral hemispheres, overall pattern nonspecific. No mass lesion, midline shift or mass effect. No hydrocephalus or extra-axial fluid collection. Pituitary gland and suprasellar region within normal limits. Vascular: Major intracranial vascular flow voids are maintained. Skull and upper cervical spine: Cranial junction within normal limits. Bone marrow signal intensity normal. No scalp soft tissue abnormality. Sinuses/Orbits: Prior bilateral ocular lens replacement. Paranasal sinuses are largely clear. Trace right mastoid effusion noted to left of significance. Other: None. MRA HEAD FINDINGS ANTERIOR CIRCULATION: Visualized distal cervical segments of both internal carotid arteries are patent with antegrade flow. Atheromatous irregularity about the cavernous ICAs bilaterally without hemodynamically significant stenosis. 2 mm outpouching extending anteriorly from the cavernous right ICA consistent with a small aneurysm (series 5, image 77). Additional 3 mm outpouching extending posteriorly from the cavernous left ICA also suspicious for aneurysm (series 5, image 76). 3 mm outpouching extending inferiorly from the supraclinoid left ICA could reflect an additional small aneurysm (series 5, image 87). A1 segments patent bilaterally. Normal anterior communicating complex. Anterior cerebral arteries widely patent. No M1 stenosis or occlusion. 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 Distal MCA branches perfused and symmetric. POSTERIOR CIRCULATION: Both V4 segments are patent without stenosis. Right vertebral artery dominant. Right PICA patent at its origin. Left PICA origin not seen. Basilar patent without stenosis. Superior cerebellar and posterior cerebral arteries  widely patent bilaterally. MRA NECK FINDINGS AORTIC ARCH: Visualized aortic arch within normal limits for caliber with standard branch pattern. No visible stenosis about the origin of the great  vessels. RIGHT CAROTID SYSTEM: Right common and internal carotid arteries are patent with antegrade flow. No evidence for dissection. Atheromatous irregularity about the right carotid bulb without hemodynamically significant greater than 50% stenosis. No ulceration. LEFT CAROTID SYSTEM: Left common and internal carotid arteries are patent with antegrade flow. No evidence for dissection. Atheromatous irregularity about the left carotid bulb without hemodynamically significant greater than 50% stenosis. No ulceration. VERTEBRAL ARTERIES: Both vertebral arteries arise from the subclavian arteries. No visible proximal subclavian artery stenosis. Right vertebral artery dominant. Vertebral arteries are patent with antegrade flow. No dissection or stenosis. IMPRESSION: MRI HEAD IMPRESSION: 1. No acute intracranial abnormality. 2. 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 3. Innumerable punctate chronic micro hemorrhages involving both cerebral hemispheres. Overall pattern is nonspecific, with differential considerations including sequelae of prior cardiac bypass, chronic hypertension, cerebral amyloid angiopathy, prior trauma, or possibly vasculitis. MRA HEAD IMPRESSION: 1. Negative intracranial MRA for large vessel occlusion. No hemodynamically significant or correctable stenosis. 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 5. 3 mm supraclinoid left ICA aneurysm. MRA NECK IMPRESSION: 1. No acute vascular abnormality within the neck. 2. Mild atheromatous irregularity about both carotid artery systems within the neck without hemodynamically significant greater than 50% stenosis. 3. Patent vertebral arteries within the neck. Right vertebral artery dominant. Electronically Signed   By: Rise Mu M.D.   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  He will be started on steroids. -Currently on metoprolol tartarate 75 mg every 6 hours which I will continue. Heparin drip switched to Eliquis 5 mg twice daily.  No indication of aspirin while on Eliquis.  Due to new onset A-fib and new onset cardiomyopathy LVEF 45 to 50%, he will be scheduled for TEE guided DCCV on 08/16/2022.  Can get limited echocardiogram after DCCV in 3 to 4 weeks to document improvement in LVEF.  Follow-up with outpatient cardiology.  Informed consent for TEE guided DCCV The risks, benefits and alternatives for the procedure were discussed and the patient comprehended these risks. Risks include, but are not limited to, cough, aspiration, infection, chest soreness, sore throat, cardiac arrest, possibility of PPM implantation.     Signed, Marjo Bicker, MD  08/15/2022, 12:06 PM

## 2022-08-15 NOTE — Progress Notes (Signed)
ANTICOAGULATION CONSULT NOTE - Follow Up Consult  Pharmacy Consult for IV heparin Indication: afib  No Known Allergies  Patient Measurements: Height: 5\' 8"  (172.7 cm) Weight: 65.7 kg (144 lb 14.4 oz) IBW/kg (Calculated) : 68.4 Heparin Dosing Weight: 68.4 kg  Vital Signs: Temp: 98.5 F (36.9 C) (08/04 0236) Temp Source: Oral (08/04 0236) BP: 138/72 (08/04 0236) Pulse Rate: 65 (08/04 0236)  Labs: Recent Labs    08/12/22 1132 08/13/22 1340 08/14/22 0317 08/14/22 1724 08/15/22 0241  HGB 9.7* 9.5* 9.4*  --   --   HCT 29.5* 30.7* 29.1*  --   --   PLT 228 274 183  --   --   HEPARINUNFRC  --   --   --  0.13* 0.21*  CREATININE  --  1.13 1.12  --  1.05    Estimated Creatinine Clearance: 52.1 mL/min (by C-G formula based on SCr of 1.05 mg/dL).   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    History of MI (myocardial infarction)    History of nuclear stress test 08/07/2010   dipyridamole; EKG negative for ischemia, low risk scan    Hyperlipidemia    Hyperplastic colon polyp 05/2008   Hypertension    Ischemic cardiomyopathy    EF 45%, with inferior wall motion abnormality    Myocardial infarction (HCC)    Valvular regurgitation    mitral and tricuspid (mild)    Medications:  Infusions:   sodium chloride Stopped (08/14/22 1406)   heparin 900 Units/hr (08/15/22 0416)   methylPREDNISolone (SOLU-MEDROL) injection Stopped (08/14/22 1335)    Assessment: 82 yo male with new onset afib.  Pharmacy asked to start  anticoagulation with IV heparin.  Heparin level 0.21 (subtherapeutic) on heparin 900 units/hr. No bleeding or issues with the infusion per discussion with RN.  Goal of Therapy:  Heparin level 0.3-0.7 units/ml Monitor platelets by anticoagulation protocol: Yes   Plan:  Increase IV heparin to 1050 units/hr Check heparin level 8 hrs  Daily heparin level and CBC. F/u plans for DOAC soon  Arabella Merles, PharmD. Clinical Pharmacist 08/15/2022 4:45 AM

## 2022-08-16 ENCOUNTER — Inpatient Hospital Stay (HOSPITAL_COMMUNITY): Payer: Medicare Other | Admitting: Anesthesiology

## 2022-08-16 ENCOUNTER — Other Ambulatory Visit (HOSPITAL_COMMUNITY): Payer: Self-pay

## 2022-08-16 ENCOUNTER — Encounter (HOSPITAL_COMMUNITY): Payer: Self-pay

## 2022-08-16 ENCOUNTER — Encounter (HOSPITAL_COMMUNITY)
Admission: EM | Disposition: A | Discharge status: Home / Self Care | Payer: Self-pay | Source: Home / Self Care | Attending: Internal Medicine

## 2022-08-16 ENCOUNTER — Encounter (HOSPITAL_COMMUNITY): Payer: Self-pay | Admitting: Internal Medicine

## 2022-08-16 ENCOUNTER — Inpatient Hospital Stay (HOSPITAL_COMMUNITY): Payer: Medicare Other

## 2022-08-16 ENCOUNTER — Other Ambulatory Visit: Payer: Self-pay

## 2022-08-16 DIAGNOSIS — I480 Paroxysmal atrial fibrillation: Secondary | ICD-10-CM | POA: Diagnosis not present

## 2022-08-16 DIAGNOSIS — I340 Nonrheumatic mitral (valve) insufficiency: Secondary | ICD-10-CM

## 2022-08-16 DIAGNOSIS — I110 Hypertensive heart disease with heart failure: Secondary | ICD-10-CM

## 2022-08-16 DIAGNOSIS — Z87891 Personal history of nicotine dependence: Secondary | ICD-10-CM

## 2022-08-16 DIAGNOSIS — I4891 Unspecified atrial fibrillation: Secondary | ICD-10-CM

## 2022-08-16 DIAGNOSIS — I5022 Chronic systolic (congestive) heart failure: Secondary | ICD-10-CM

## 2022-08-16 DIAGNOSIS — M316 Other giant cell arteritis: Secondary | ICD-10-CM | POA: Diagnosis not present

## 2022-08-16 DIAGNOSIS — I5020 Unspecified systolic (congestive) heart failure: Secondary | ICD-10-CM

## 2022-08-16 HISTORY — PX: TEE WITHOUT CARDIOVERSION: SHX5443

## 2022-08-16 HISTORY — PX: CARDIOVERSION: SHX1299

## 2022-08-16 LAB — ECHO TEE

## 2022-08-16 LAB — GLUCOSE, CAPILLARY: Glucose-Capillary: 168 mg/dL — ABNORMAL HIGH (ref 70–99)

## 2022-08-16 SURGERY — ECHOCARDIOGRAM, TRANSESOPHAGEAL
Anesthesia: General

## 2022-08-16 MED ORDER — METOPROLOL TARTRATE 50 MG PO TABS
75.0000 mg | ORAL_TABLET | Freq: Two times a day (BID) | ORAL | 0 refills | Status: DC
  Filled 2022-08-16: qty 90, 30d supply, fill #0

## 2022-08-16 MED ORDER — LOSARTAN POTASSIUM 25 MG PO TABS
25.0000 mg | ORAL_TABLET | Freq: Every day | ORAL | 1 refills | Status: DC
Start: 2022-08-16 — End: 2023-02-15
  Filled 2022-08-16: qty 90, 90d supply, fill #0

## 2022-08-16 MED ORDER — LOSARTAN POTASSIUM 25 MG PO TABS
25.0000 mg | ORAL_TABLET | Freq: Every day | ORAL | Status: DC
  Administered 2022-08-16: 25 mg via ORAL
  Filled 2022-08-16: qty 1

## 2022-08-16 MED ORDER — LIDOCAINE 2% (20 MG/ML) 5 ML SYRINGE
INTRAMUSCULAR | Status: DC | PRN
  Administered 2022-08-16: 100 mg via INTRAVENOUS

## 2022-08-16 MED ORDER — BLOOD GLUCOSE MONITOR SYSTEM W/DEVICE KIT
1.0000 | PACK | Freq: Three times a day (TID) | 0 refills | Status: DC
  Filled 2022-08-16: qty 1, 30d supply, fill #0

## 2022-08-16 MED ORDER — BENAZEPRIL HCL 10 MG PO TABS
10.0000 mg | ORAL_TABLET | Freq: Every day | ORAL | 0 refills | Status: DC
  Filled 2022-08-16: qty 30, 30d supply, fill #0

## 2022-08-16 MED ORDER — PREDNISONE 10 MG PO TABS
60.0000 mg | ORAL_TABLET | Freq: Every day | ORAL | 0 refills | Status: DC
Start: 2022-08-16 — End: 2022-08-20
  Filled 2022-08-16: qty 151, 25d supply, fill #0

## 2022-08-16 MED ORDER — INSULIN STARTER KIT- PEN NEEDLES (ENGLISH)
1.0000 | Freq: Once | Status: AC
  Administered 2022-08-16: 1
  Filled 2022-08-16: qty 1

## 2022-08-16 MED ORDER — EMPAGLIFLOZIN 10 MG PO TABS
10.0000 mg | ORAL_TABLET | Freq: Every day | ORAL | 2 refills | Status: DC
Start: 2022-08-16 — End: 2023-05-12
  Filled 2022-08-16: qty 30, 30d supply, fill #0

## 2022-08-16 MED ORDER — EMPAGLIFLOZIN 10 MG PO TABS
10.0000 mg | ORAL_TABLET | Freq: Every day | ORAL | Status: DC
  Administered 2022-08-16: 10 mg via ORAL
  Filled 2022-08-16: qty 1

## 2022-08-16 MED ORDER — ACCU-CHEK SOFTCLIX LANCETS MISC
1.0000 | Freq: Three times a day (TID) | 0 refills | Status: AC
Start: 2022-08-16 — End: 2022-09-15
  Filled 2022-08-16: qty 100, 30d supply, fill #0

## 2022-08-16 MED ORDER — METOPROLOL TARTRATE 50 MG PO TABS: 75.0000 mg | ORAL_TABLET | Freq: Two times a day (BID) | ORAL | Status: DC

## 2022-08-16 MED ORDER — APIXABAN 5 MG PO TABS
5.0000 mg | ORAL_TABLET | Freq: Two times a day (BID) | ORAL | 0 refills | Status: DC
Start: 2022-08-16 — End: 2022-09-20
  Filled 2022-08-16: qty 60, 30d supply, fill #0

## 2022-08-16 MED ORDER — FIASP FLEXTOUCH 100 UNIT/ML ~~LOC~~ SOPN
PEN_INJECTOR | SUBCUTANEOUS | 0 refills | Status: DC
  Filled 2022-08-16: qty 9, 30d supply, fill #0

## 2022-08-16 MED ORDER — BLOOD GLUCOSE TEST VI STRP
1.0000 | ORAL_STRIP | Freq: Three times a day (TID) | 0 refills | Status: DC
  Filled 2022-08-16: qty 50, 17d supply, fill #0

## 2022-08-16 MED ORDER — INSULIN PEN NEEDLE 32G X 4 MM MISC
0 refills | Status: DC
  Filled 2022-08-16: qty 100, 25d supply, fill #0

## 2022-08-16 MED ORDER — PROPOFOL 10 MG/ML IV BOLUS
INTRAVENOUS | Status: DC | PRN
  Administered 2022-08-16 (×2): 10 mg via INTRAVENOUS
  Administered 2022-08-16: 50 mg via INTRAVENOUS
  Administered 2022-08-16 (×2): 20 mg via INTRAVENOUS

## 2022-08-16 MED ORDER — LISINOPRIL 2.5 MG PO TABS: 2.5000 mg | ORAL_TABLET | Freq: Every day | ORAL | Status: DC

## 2022-08-16 MED ORDER — LANCET DEVICE MISC
1.0000 | Freq: Three times a day (TID) | 0 refills | Status: AC
  Filled 2022-08-16: qty 1, 30d supply, fill #0

## 2022-08-16 MED ORDER — LIVING WELL WITH DIABETES BOOK
Freq: Once | Status: AC
  Filled 2022-08-16: qty 1

## 2022-08-16 SURGICAL SUPPLY — 1 items: ELECT DEFIB PAD ADLT CADENCE (PAD) ×2 IMPLANT

## 2022-08-16 NOTE — Transfer of Care (Signed)
Immediate Anesthesia Transfer of Care Note  Patient: Donald Rios  Procedure(s) Performed: TRANSESOPHAGEAL ECHOCARDIOGRAM CARDIOVERSION  Patient Location: PACU  Anesthesia Type:MAC and General  Level of Consciousness: drowsy and patient cooperative  Airway & Oxygen Therapy: Patient Spontanous Breathing and Patient connected to nasal cannula oxygen  Post-op Assessment: Report given to RN and Post -op Vital signs reviewed and stable  Post vital signs: Reviewed and stable  Last Vitals:  Vitals Value Taken Time  BP    Temp    Pulse    Resp    SpO2      Last Pain:  Vitals:   08/16/22 0910  TempSrc:   PainSc: 0-No pain      Patients Stated Pain Goal: 0 (08/15/22 2045)  Complications: No notable events documented.

## 2022-08-16 NOTE — Inpatient Diabetes Management (Signed)
Inpatient Diabetes Program Recommendations  AACE/ADA: New Consensus Statement on Inpatient Glycemic Control (2015)  Target Ranges:  Prepandial:   less than 140 mg/dL      Peak postprandial:   less than 180 mg/dL (1-2 hours)      Critically ill patients:  140 - 180 mg/dL    Educated patient on insulin pen use at home. Reviewed contents of insulin flexpen starter kit. Reviewed all steps if insulin pen including attachment of needle, 2-unit air shot, dialing up dose, giving injection, removing needle, disposal of sharps, storage of unused insulin, disposal of insulin etc. Patient able to provide successful return demonstration. Also reviewed troubleshooting with insulin pen. MD to give patient Rxs for insulin pens and insulin pen needles.  Discussed which type of insulin pt would be discharged on. Reviewed how many times to check his glucose and when.   Thanks,  Christena Deem RN, MSN, BC-ADM Inpatient Diabetes Coordinator Team Pager [phone] (8a-5p)

## 2022-08-16 NOTE — Progress Notes (Signed)
Heart Failure Navigator Progress Note  Assessed for Heart & Vascular TOC clinic readiness.  Patient does not meet criteria due to patient with a [facility] appointment on 08/23/2022. .   Navigator available for reassessment of patient.   Rhae Hammock, BSN, Scientist, clinical (histocompatibility and immunogenetics) Only

## 2022-08-16 NOTE — TOC Benefit Eligibility Note (Addendum)
Pharmacy Patient Advocate Encounter  Insurance verification completed.    The patient is insured through [facility] Medicare Part D  Ran test claim for Eliquis 5 mg and the current 30 day co-pay is $47.00.  Ran test claim for Jardiance 10 mg and the current 30 day co-pay is $11.00.   This test claim was processed through [facility]- copay amounts may vary at other pharmacies due to pharmacy/plan contracts, or as the patient moves through the different stages of their insurance plan.    Donald Rios, CPHT Pharmacy Patient Advocate Specialist [facility] Patient Advocate Team Direct Number: [phone]  Fax: [phone]

## 2022-08-16 NOTE — Anesthesia Preprocedure Evaluation (Signed)
Anesthesia Evaluation  Patient identified by MRN, date of birth, ID band Patient awake    Reviewed: Allergy & Precautions, NPO status , Patient's Chart, lab work & pertinent test results  Airway Mallampati: II  TM Distance: >3 FB Neck ROM: Full    Dental  (+) Dental Advisory Given, Edentulous Upper, Missing   Pulmonary asthma , COPD, former smoker   Pulmonary exam normal breath sounds clear to auscultation       Cardiovascular hypertension, Pt. on medications pulmonary hypertension+ CAD, + Past MI, + CABG and +CHF  + Valvular Problems/Murmurs (Mild-Mod TR) MR  Rhythm:Irregular Rate:Normal + Systolic murmurs Echo 08/14/2022  1. Left ventricular ejection fraction, by estimation, is 45 to 50%. The left ventricle has mildly decreased function. The left ventricle demonstrates global hypokinesis. There is mild left ventricular hypertrophy. Left ventricular diastolic function could not be evaluated. Elevated left ventricular end-diastolic pressure.   2. Right ventricular systolic function is normal. The right ventricular size is normal. There is severely elevated pulmonary artery systolic pressure. The estimated right ventricular systolic pressure is 62.2 mmHg.   3. Left atrial size was severely dilated.   4. The mitral valve is normal in structure. Mild to moderate mitral valve regurgitation. No evidence of mitral stenosis.   5. The tricuspid valve is abnormal. Tricuspid valve regurgitation is mild to moderate.   6. The aortic valve is tricuspid. Aortic valve regurgitation is not visualized. No aortic stenosis is present.   7. The inferior vena cava is dilated in size with >50% respiratory variability, suggesting right atrial pressure of 8 mmHg.     Neuro/Psych  Neuromuscular disease    GI/Hepatic Neg liver ROS, hiatal hernia, PUD,GERD  ,,  Endo/Other  negative endocrine ROSdiabetes    Renal/GU Renal disease     Musculoskeletal  (+)  Arthritis ,    Abdominal   Peds  Hematology  (+) Blood dyscrasia, anemia   Anesthesia Other Findings   Reproductive/Obstetrics                             Anesthesia Physical Anesthesia Plan  ASA: 4  Anesthesia Plan: General   Post-op Pain Management: Minimal or no pain anticipated   Induction: Intravenous  PONV Risk Score and Plan: 2 and TIVA, Propofol infusion and Treatment may vary due to age or medical condition  Airway Management Planned: Natural Airway  Additional Equipment:   Intra-op Plan:   Post-operative Plan:   Informed Consent: I have reviewed the patients History and Physical, chart, labs and discussed the procedure including the risks, benefits and alternatives for the proposed anesthesia with the patient or authorized representative who has indicated his/her understanding and acceptance.     Dental advisory given  Plan Discussed with: CRNA  Anesthesia Plan Comments:        Anesthesia Quick Evaluation

## 2022-08-16 NOTE — Discharge Summary (Signed)
CIANAN MORMAN ZOX:[id number] DOB: 06-02-1940 DOA: 08/13/2022  PCP: [name], MD  Admit date: 08/13/2022  Discharge date: 08/16/2022  Admitted From: Home   Disposition:  Home   Recommendations for Outpatient Follow-up:   Follow up with PCP in 1-2 weeks  PCP Please obtain BMP/CBC, 2 view CXR in 1week,  (see Discharge instructions)   PCP Please follow up on the following pending results:    Home Health: Non   Equipment/Devices: None   Consultations: Neuro, cards Discharge Condition: Stable    CODE STATUS: Full    Diet Recommendation: Heart Healthy Low Carb  Diet Order     None        Chief Complaint  Patient presents with   Temporal Arteritis     Brief history of present illness from the day of admission and additional interim summary    82 y.o. 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  Neurology was consulted and I was requested to admit the patient                                                                  Hospital Course   Gradually progressive but near total left eye vision  present on admission, mildly blurry right eye vision developed after hospitalization despite being on high-dose steroids.  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  Remote strokes noted and cleared by neuro, carotid vascular ultrasound with classic bilateral halo sign noted, continue high-dose steroids along with Eliquis for A-fib.  Case was discussed with neurology again on 08/15/2022, currently symptoms have stabilized, will be discharged on prednisone 60 mg daily with outpatient follow-up with PCP within a week request PCP to arrange for follow-up with his rheumatologist within 7 to 10 days.  He tells me he has been seen by dermatology multiple times in the past due to past history of polymyalgia rheumatica.  Further titration of prednisone will be deferred to neurology outpatient and is rheumatologist.   2.  CAD s/p CABG.  No acute issues no chest pain, continue home dose aspirin, statin and beta-blocker for secondary prevention.  Cardiology to see.   3.  New diagnosis of atrial fibrillation.  [place] vas 2 score of greater than 3.  Stable TSH, echo noted as below, he was initially on heparin drip and now on Eliquis after clearance with neurology.  Beta-blocker dose increased for better rate control, underwent TEE cardioversion on 08/16/2022 which was successful, EF was 30%.  He will continue to follow with PCP and cardiology postdischarge.   4.  Previous history of ischemic EF 45% with global hypokinesis on echocardiogram, TEE shows a EF of 30%. Cards on board, clinically compensated, on beta-blocker and ARB + Jardiance per cards, sometimes 3 will be discharged with close outpatient follow-up with PCP and cardiology.   5.  Dyslipidemia.  Continue him on statin, check lipid panel in the morning along with A1c.   6.  GERD with history of  GI bleed.  PPI.   7.  Essential hypertension.  Blocker dose increased for better rate control, ACE >> ARB, blood pressure stable.     8. DM type II. A1c, sliding scale due to high-dose steroids.  Insulin education and diabetic testing education provided prior to discharge.    Discharge diagnosis     Principal Problem:   Temporal arteritis (HCC) Active Problems:   DM2 (diabetes mellitus, type 2) (HCC)   GERD (gastroesophageal reflux disease)   Mixed hyperlipidemia   Essential hypertension   Hx of CABG   New onset atrial fibrillation (HCC)   Cardiomyopathy [facility])    Discharge instructions    Discharge Instructions     Discharge instructions   Complete by: As directed    Wear a left eye patch while driving.  You can wear it in regular times as well if you have problems with double vision.  Follow with Primary MD [name], MD in 7 days, follow-up with the ophthalmologist and your rheumatologist within 7 to 10 days of discharge  Get CBC, CMP, 2 view Chest X ray -  checked next visit with your primary MD    Activity: As tolerated with Full fall precautions use walker/cane & assistance as needed  Disposition Home    Diet: Heart Healthy Low Carb  Accuchecks 4 times/day, Once in AM empty stomach and then before each meal. Log in all results and show them to your Prim.MD in 3 days. If any glucose reading is under 80 or above 300 call your Prim MD immidiately. Follow Low glucose instructions for glucose under 80 as instructed.   Special Instructions: If you have smoked or chewed Tobacco  in the last 2 yrs please stop smoking, stop any regular Alcohol  and or any Recreational drug use.  On your next visit with your primary care physician please Get Medicines reviewed and adjusted.  Please request your Prim.MD to go over all Hospital Tests and Procedure/Radiological results at the follow up, please get all Hospital records sent to your Prim MD by signing hospital release  before you go home.  If you experience worsening of your admission symptoms, develop shortness of breath, life threatening emergency, suicidal or homicidal thoughts you must seek medical attention immediately by calling 911 or calling your MD immediately  if symptoms less severe.  You Must read complete instructions/literature along with all the possible adverse reactions/side effects  for all the Medicines you take and that have been prescribed to you. Take any new Medicines after you have completely understood and accpet all the possible adverse reactions/side effects.   Increase activity slowly   Complete by: As directed        Discharge Medications   Allergies as of 08/16/2022   No Known Allergies      Medication List     STOP taking these medications    amLODipine 5 MG tablet Commonly known as: NORVASC   aspirin 81 MG tablet   benazepril 40 MG tablet Commonly known as: LOTENSIN       TAKE these medications    Accu-Chek Softclix Lancets lancets Use in the morning, at noon, and at bedtime.   allopurinol 300 MG tablet Commonly known as: ZYLOPRIM TAKE 1 TABLET BY MOUTH DAILY What changed: when to take this   apixaban 5 MG Tabs tablet Commonly known as: ELIQUIS Take 1 tablet (5 mg total) by mouth 2 (two) times daily.   Blood Glucose Monitor System w/Device Kit 1 each by Does not apply route in the morning, at noon, and at bedtime.   BLOOD GLUCOSE TEST STRIPS Strp Use in the morning, at noon, and at bedtime. May substitute to any manufacturer covered by patient's insurance.   doxazosin 2 MG tablet Commonly known as: CARDURA TAKE 1 TABLET BY MOUTH DAILY What changed: when to take this   empagliflozin 10 MG Tabs tablet Commonly known as: JARDIANCE Take 1 tablet (10 mg total) by mouth daily.   Fiasp FlexTouch 100 UNIT/ML FlexTouch Pen Generic drug: insulin aspart Before each meal 3 times a day, 140-199 - 2 units, 200-250 - 4 units, 251-299 - 6 units,  300-349  - 8 units,  350 or above 10 units.   Insulin Pen Needle 32G X 4 MM Misc For 4 times a day insulin injection under the skin, 1 month supply.   Lancet Device Misc 1 each by Does not apply route in the morning, at noon, and at bedtime. May substitute to any manufacturer covered by patient's insurance.   losartan 25 MG tablet Commonly known as: COZAAR Take 1 tablet (25 mg total) by mouth daily.   metoprolol tartrate 50 MG tablet Commonly known as: LOPRESSOR Take 1.5 tablets (75 mg total) by mouth 2 (two) times daily. What changed: See the new instructions.   mirtazapine 30 MG tablet Commonly known as: REMERON TAKE 1 TABLET BY MOUTH EVERY NIGHT AT BEDTIME FOR APPETITE   pantoprazole 40 MG tablet Commonly known as: PROTONIX Take 1 tablet (40 mg total) by mouth daily.   predniSONE 10 MG tablet Commonly known as: DELTASONE Take 6 tablets (60 mg total) by mouth daily. You must follow-up with your rheumatologist before stopping or changing the dose of this medication, follow-up with your rheumatologist within the next 7 to 10 days.   simvastatin 40 MG tablet Commonly known as: ZOCOR TAKE ONE TABLET BY MOUTH DAILY What changed: when to take this          Follow-up Information     [name], MD. Schedule an appointment as soon as possible for a visit in 1 week(s).   Specialty: Family Medicine Why: Get a referral to a rheumatologist within a week of discharge, follow-up with your ophthalmologist in a week as well Contact information: [address] [place] [address] [phone]         [facility]. Schedule an appointment as soon as  possible for a visit in 2 week(s).   Contact information: [address]     [address] [place] [address] [phone]        Jake Bathe, MD. Schedule an appointment as soon as possible for a visit in 1 month(s).   Specialty: Cardiology Contact information: [address] [place] [address] [phone]                 Major procedures and Radiology Reports - PLEASE review detailed and final reports thoroughly  -      ECHO TEE  Result Date: 08/16/2022    TRANSESOPHOGEAL ECHO REPORT   Patient Name:   Donald Rios Date of Exam: 08/16/2022 Medical Rec #:  [id number]      Height:       68.0 in Accession #:    [id number]     Weight:       144.9 lb Date of Birth:  [birth year]      BSA:          1.782 m Patient Age:    81 years       BP:           134/63 mmHg Patient Gender: M              HR:           82 bpm. Exam Location:  Inpatient Procedure: Transesophageal Echo, 3D Echo, Color Doppler and Cardiac Doppler Indications:     Arrhythmia  History:         Patient has prior history of Echocardiogram examinations, most                  recent 08/14/2022. Abnormal ECG, COPD, Mitral Valve Disease; Risk                  Factors:Hypertension and Diabetes.  Sonographer:     Darlys Gales Referring Phys:  [id number] SKAINS Diagnosing Phys: [name] MD PROCEDURE: After discussion of the risks and benefits of a TEE, an informed consent was obtained from the patient. The transesophogeal probe was passed without difficulty through the esophogus of the patient. Sedation performed by different physician. The patient was monitored while under deep sedation. Anesthestetic sedation was provided intravenously by Anesthesiology: 110mg  of Propofol, 100mg  of Lidocaine. The patient's vital signs; including heart rate, blood pressure, and oxygen saturation; remained stable throughout the procedure. The patient developed no complications during the procedure. A successful direct current cardioversion was performed at 200 joules with 1 attempt.  IMPRESSIONS  1. Left ventricular ejection fraction, by estimation, is 25 to 30%. The left ventricle has severely decreased function. The left ventricle demonstrates global hypokinesis.  2. Right ventricular systolic function is normal.  The right ventricular size is normal.  3. Left atrial size was moderately dilated. No left atrial/left atrial appendage thrombus was detected.  4. The mitral valve is normal in structure. Moderate to severe mitral valve regurgitation. No evidence of mitral stenosis.  5. The aortic valve is tricuspid. Aortic valve regurgitation is not visualized. No aortic stenosis is present.  6. The inferior vena cava is normal in size with greater than 50% respiratory variability, suggesting right atrial pressure of 3 mmHg. FINDINGS  Left Ventricle: Left ventricular ejection fraction, by estimation, is 25 to 30%. The left ventricle has severely decreased function. The left ventricle demonstrates global hypokinesis. The left ventricular internal cavity size was normal in size. There is  no left ventricular hypertrophy. Right Ventricle: The right ventricular size is normal. No increase in right ventricular wall thickness. Right ventricular systolic function is normal. Left Atrium: Left atrial size was moderately dilated. No left atrial/left atrial appendage thrombus was detected. Right Atrium: Right atrial size was normal in size. Pericardium: There is no evidence of pericardial effusion. Mitral Valve: The mitral valve is normal in structure. Moderate to severe mitral valve regurgitation. No evidence of mitral valve stenosis. Tricuspid Valve: The tricuspid valve is normal in structure. Tricuspid valve regurgitation is mild . No evidence of tricuspid stenosis. Aortic Valve: The aortic valve is tricuspid. Aortic valve regurgitation is not visualized. No aortic stenosis is present. Pulmonic Valve: The pulmonic valve was normal in structure. Pulmonic valve regurgitation is not visualized. No evidence of pulmonic stenosis. Aorta: The aortic root is normal in size and structure. Venous: The inferior vena cava is normal in size with greater than 50% respiratory variability, suggesting right atrial pressure of 3 mmHg. IAS/Shunts: No atrial  level shunt detected by color flow Doppler. [name] MD Electronically signed by [name] MD Signature Date/Time: 08/16/2022/11:23:05 AM    Final    EP STUDY  Result Date: 08/16/2022 See surgical note for result.  ECHOCARDIOGRAM COMPLETE  Result Date: 08/14/2022    ECHOCARDIOGRAM REPORT   Patient Name:   KAYSHAUN EVETTS Date of Exam: 08/14/2022 Medical Rec #:  [id number]      Height:       68.0 in Accession #:    [id number]     Weight:       144.9 lb Date of Birth:  [birth year]      BSA:          1.782 m Patient Age:    81 years       BP:           111/75 mmHg Patient Gender: M              HR:           107 bpm. Exam Location:  Inpatient Procedure: 2D Echo, Cardiac Doppler, Color Doppler and Intracardiac            Opacification Agent Indications:    CHF-Acute Diastolic I50.31  History:        Patient has prior history of Echocardiogram examinations, most                 recent 10/02/2019. Cardiomyopathy and CHF, CAD, Prior CABG; Risk                 Factors:Hypertension, Diabetes and Dyslipidemia.  Sonographer:    Lucendia Herrlich Referring Phys: Effie Shy  K [facility] IMPRESSIONS  1. Left ventricular ejection fraction, by estimation, is 45 to 50%. The left ventricle has mildly decreased function. The left ventricle demonstrates global hypokinesis. There is mild left ventricular hypertrophy. Left ventricular diastolic function could not be evaluated. Elevated left ventricular end-diastolic pressure.  2. Right ventricular systolic function is normal. The right ventricular size is normal. There is severely elevated pulmonary artery systolic pressure. The estimated right ventricular systolic pressure is 62.2 mmHg.  3. Left atrial size was severely dilated.  4. The mitral valve is normal in structure. Mild to moderate mitral valve regurgitation. No evidence of mitral stenosis.  5. The tricuspid valve is abnormal. Tricuspid valve regurgitation is mild to moderate.  6. The aortic valve is tricuspid. Aortic valve  regurgitation is not visualized. No aortic stenosis is present.  7. The inferior  vena cava is dilated in size with >50% respiratory variability, suggesting right atrial pressure of 8 mmHg. Comparison(s): Changes from prior study are noted. LVEF worsened to 45-50% from 60-65% in 2021. FINDINGS  Left Ventricle: Left ventricular ejection fraction, by estimation, is 45 to 50%. The left ventricle has mildly decreased function. The left ventricle demonstrates global hypokinesis. Definity contrast agent was given IV to delineate the left ventricular  endocardial borders. The left ventricular internal cavity size was normal in size. There is mild left ventricular hypertrophy. Left ventricular diastolic function could not be evaluated due to atrial fibrillation. Left ventricular diastolic function could not be evaluated. Elevated left ventricular end-diastolic pressure. Right Ventricle: The right ventricular size is normal. No increase in right ventricular wall thickness. Right ventricular systolic function is normal. There is severely elevated pulmonary artery systolic pressure. The tricuspid regurgitant velocity is 3.68 m/s, and with an assumed right atrial pressure of 8 mmHg, the estimated right ventricular systolic pressure is 62.2 mmHg. Left Atrium: Left atrial size was severely dilated. Right Atrium: Right atrial size was normal in size. Pericardium: There is no evidence of pericardial effusion. Mitral Valve: The mitral valve is normal in structure. Mild to moderate mitral valve regurgitation. No evidence of mitral valve stenosis. Tricuspid Valve: The tricuspid valve is abnormal. Tricuspid valve regurgitation is mild to moderate. No evidence of tricuspid stenosis. Aortic Valve: The aortic valve is tricuspid. Aortic valve regurgitation is not visualized. No aortic stenosis is present. Aortic valve peak gradient measures 7.6 mmHg. Pulmonic Valve: The pulmonic valve was normal in structure. Pulmonic valve regurgitation is  trivial. No evidence of pulmonic stenosis. Aorta: The aortic root and ascending aorta are structurally normal, with no evidence of dilitation. Venous: The inferior vena cava is dilated in size with greater than 50% respiratory variability, suggesting right atrial pressure of 8 mmHg. IAS/Shunts: No atrial level shunt detected by color flow Doppler.  LEFT VENTRICLE PLAX 2D LVIDd:         4.50 cm   Diastology LVIDs:         3.70 cm   LV e' medial:    6.96 cm/s LV PW:         1.25 cm   LV E/e' medial:  20.0 LV IVS:        1.20 cm   LV e' lateral:   10.90 cm/s LVOT diam:     2.20 cm   LV E/e' lateral: 12.8 LV SV:         56 LV SV Index:   31 LVOT Area:     3.80 cm  RIGHT VENTRICLE            IVC RV S prime:     9.90 cm/s  IVC diam: 2.40 cm TAPSE (M-mode): 1.5 cm LEFT ATRIUM             Index        RIGHT ATRIUM           Index LA diam:        5.90 cm 3.31 cm/m   RA Area:     20.10 cm LA Vol (A2C):   85.5 ml 47.98 ml/m  RA Volume:   52.70 ml  29.57 ml/m LA Vol (A4C):   89.6 ml 50.28 ml/m LA Biplane Vol: 90.0 ml 50.50 ml/m  AORTIC VALVE                 PULMONIC VALVE AV Area (Vmax): 2.59 cm  PR End Diast Vel: 6.81 msec AV Vmax:        138.00 cm/s AV Peak Grad:   7.6 mmHg LVOT Vmax:      94.10 cm/s LVOT Vmean:     63.233 cm/s LVOT VTI:       0.148 m  AORTA Ao Root diam: 3.20 cm Ao Asc diam:  2.20 cm MITRAL VALVE                TRICUSPID VALVE MV Area (PHT): 4.96 cm     TR Peak grad:   54.2 mmHg MV Decel Time: 153 msec     TR Vmax:        368.00 cm/s MR Peak grad: 114.9 mmHg MR Vmax:      536.00 cm/s   SHUNTS MV E velocity: 139.00 cm/s  Systemic VTI:  0.15 m                             Systemic Diam: 2.20 cm Vishnu Priya Mallipeddi Electronically signed by Winfield Rast Mallipeddi Signature Date/Time: 08/14/2022/11:25:17 AM    Final    VAS [place] TEMPORAL ARTERY BILATERAL  Result Date: 08/14/2022  TEMPORAL ARTERY REPORT Patient Name:  NACHMEN GUTERREZ  Date of Exam:   08/14/2022 Medical Rec #: [id number]       Accession  #:    [id number] Date of Birth: 03/23/1940       Patient Gender: M Patient Age:   41 years Exam [facility]  [facility] Procedure:      VAS [place] TEMPORAL ARTERY BILATERAL Referring Phys: MCNEILL KIRKPATRICK --------------------------------------------------------------------------------  Indications: Jaw pain, blurred vision , abnormal lab values and Ophthalmologist              noted edema of disc, sent patient for Labs, ESR and CRP elevated.              This study was technically difficult secondary to patient movement,              coughing, and talking. High Risk Factors: Age > 50 yrs.  Comparison Study: No prior study on file Performing Technologist: Sherren Kerns RVS  Examination Guidelines: Patient in reclined position. 2D, color and spectral doppler sampling in the temporal artery along the hairline and temple in the longitudinal plane. 2D images along the hairline and temple in the transverse plane. Exam is bilateral.  ++-------------------+------------------+ Right Halo POSITIVELeft Halo POSITIVE ++-------------------+------------------+ +--------+----------+-----------+----------+-----------+         Width (cm)Length (cm)Width (cm)Length (cm) +--------+----------+-----------+----------+-----------+ Proximal0.15                 0.13                  +--------+----------+-----------+----------+-----------+ Mid     0.14                 0.10                  +--------+----------+-----------+----------+-----------+ Distal  0.13                 0.06                  +--------+----------+-----------+----------+-----------+ Technically difficult and limited study, see indications above. No compression of bilateral temporal arteries at proximal, mid, or distal segments. Abnormal flow noted throughout. 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  *See table(s) above for measurements  and observations.  Suggest Peripheral Vascular  Consult.   Preliminary    CT HEAD WO CONTRAST ( )  Result Date: 08/13/2022 CLINICAL DATA:  Initial evaluation for headache, neuro deficit. EXAM: CT HEAD WITHOUT CONTRAST TECHNIQUE: Contiguous axial images were obtained from the base of the skull through the vertex without intravenous contrast. RADIATION DOSE REDUCTION: This exam was performed according to the departmental dose-optimization program which includes automated exposure control, adjustment of the mA and/or kV according to patient size and/or use of iterative reconstruction technique. COMPARISON:  Prior brain MRI performed earlier the same day. FINDINGS: Brain: Mild age-related cerebral atrophy with chronic small vessel ischemic disease. Remote infarcts involving the right basal ganglia, left thalamus, and left cerebellum. No acute intracranial hemorrhage. No acute large vessel territory infarct. No mass lesion or midline shift. No hydrocephalus or extra-axial fluid collection. Vascular: No abnormal hyperdense vessel. Calcified atherosclerosis present at skull base. Skull: Scalp soft tissues demonstrate no acute finding. Calvarium intact. Sinuses/Orbits: Globes and orbital soft tissues within normal limits. Paranasal sinuses and mastoid air cells are largely clear. Other: None. IMPRESSION: 1. No acute intracranial abnormality. 2. Remote infarcts involving the right basal ganglia, left thalamus, and left cerebellum. 3. Mild age-related cerebral atrophy with chronic small vessel ischemic disease. Electronically Signed   By: Rise Mu M.D.   On: 08/13/2022 20:46   MR BRAIN WO CONTRAST  Result Date: 08/13/2022 CLINICAL DATA:  Initial evaluation for headache, neuro deficit. EXAM: MRI HEAD WITHOUT CONTRAST MRA HEAD WITHOUT CONTRAST MRA NECK WITHOUT CONTRAST TECHNIQUE: Multiplanar, multiecho pulse sequences of the brain and surrounding structures were obtained without intravenous contrast. Angiographic images of the Circle of Willis were  obtained using MRA technique without intravenous contrast. Angiographic images of the neck were obtained using MRA technique without intravenous contrast. Carotid stenosis measurements (when applicable) are obtained utilizing NASCET criteria, using the distal internal carotid diameter as the denominator. COMPARISON:  None Available. FINDINGS: MRI HEAD FINDINGS Brain: Cerebral volume within normal limits. Mild chronic microvascular ischemic disease for age. Remote lacunar infarct present at the anterior right basal ganglia. Additional small remote left cerebellar infarct. Associated chronic blood products noted about these chronic infarcts. Additional chronic lacunar infarct present at the left thalamocapsular region. No evidence for acute or subacute ischemia. Gray-white matter differentiation maintained. No acute intracranial hemorrhage. Innumerable punctate chronic micro hemorrhages noted involving both cerebral hemispheres, overall pattern nonspecific. No mass lesion, midline shift or mass effect. No hydrocephalus or extra-axial fluid collection. Pituitary gland and suprasellar region within normal limits. Vascular: Major intracranial vascular flow voids are maintained. Skull and upper cervical spine: Cranial junction within normal limits. Bone marrow signal intensity normal. No scalp soft tissue abnormality. Sinuses/Orbits: Prior bilateral ocular lens replacement. Paranasal sinuses are largely clear. Trace right mastoid effusion noted to left of significance. Other: None. MRA HEAD FINDINGS ANTERIOR CIRCULATION: Visualized distal cervical segments of both internal carotid arteries are patent with antegrade flow. Atheromatous irregularity about the cavernous ICAs bilaterally without hemodynamically significant stenosis. 2 mm outpouching extending anteriorly from the cavernous right ICA consistent with a small aneurysm (series 5, image 77). Additional 3 mm outpouching extending posteriorly from the cavernous left  ICA also suspicious for aneurysm (series 5, image 76). 3 mm outpouching extending inferiorly from the supraclinoid left ICA could reflect an additional small aneurysm (series 5, image 87). A1 segments patent bilaterally. Normal anterior communicating complex. Anterior cerebral arteries widely patent. No M1 stenosis or occlusion. 4 mm aneurysm present at the left MCA bifurcation (series 5,  image 95). Distal MCA branches perfused and symmetric. POSTERIOR CIRCULATION: Both V4 segments are patent without stenosis. Right vertebral artery dominant. Right PICA patent at its origin. Left PICA origin not seen. Basilar patent without stenosis. Superior cerebellar and posterior cerebral arteries widely patent bilaterally. MRA NECK FINDINGS AORTIC ARCH: Visualized aortic arch within normal limits for caliber with standard branch pattern. No visible stenosis about the origin of the great vessels. RIGHT CAROTID SYSTEM: Right common and internal carotid arteries are patent with antegrade flow. No evidence for dissection. Atheromatous irregularity about the right carotid bulb without hemodynamically significant greater than 50% stenosis. No ulceration. LEFT CAROTID SYSTEM: Left common and internal carotid arteries are patent with antegrade flow. No evidence for dissection. Atheromatous irregularity about the left carotid bulb without hemodynamically significant greater than 50% stenosis. No ulceration. VERTEBRAL ARTERIES: Both vertebral arteries arise from the subclavian arteries. No visible proximal subclavian artery stenosis. Right vertebral artery dominant. Vertebral arteries are patent with antegrade flow. No dissection or stenosis. IMPRESSION: MRI HEAD IMPRESSION: 1. No acute intracranial abnormality. 2. 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 3. Innumerable punctate chronic micro hemorrhages involving both cerebral hemispheres. Overall pattern is  nonspecific, with differential considerations including sequelae of prior cardiac bypass, chronic hypertension, cerebral amyloid angiopathy, prior trauma, or possibly vasculitis. MRA HEAD IMPRESSION: 1. Negative intracranial MRA for large vessel occlusion. No hemodynamically significant or correctable stenosis. 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 5. 3 mm supraclinoid left ICA aneurysm. MRA NECK IMPRESSION: 1. No acute vascular abnormality within the neck. 2. Mild atheromatous irregularity about both carotid artery systems within the neck without hemodynamically significant greater than 50% stenosis. 3. Patent vertebral arteries within the neck. Right vertebral artery dominant. Electronically Signed   By: Rise Mu M.D.   On: 08/13/2022 20:43   MR ANGIO HEAD WO CONTRAST  Result Date: 08/13/2022 CLINICAL DATA:  Initial evaluation for headache, neuro deficit. EXAM: MRI HEAD WITHOUT CONTRAST MRA HEAD WITHOUT CONTRAST MRA NECK WITHOUT CONTRAST TECHNIQUE: Multiplanar, multiecho pulse sequences of the brain and surrounding structures were obtained without intravenous contrast. Angiographic images of the Circle of Willis were obtained using MRA technique without intravenous contrast. Angiographic images of the neck were obtained using MRA technique without intravenous contrast. Carotid stenosis measurements (when applicable) are obtained utilizing NASCET criteria, using the distal internal carotid diameter as the denominator. COMPARISON:  None Available. FINDINGS: MRI HEAD FINDINGS Brain: Cerebral volume within normal limits. Mild chronic microvascular ischemic disease for age. Remote lacunar infarct present at the anterior right basal ganglia. Additional small remote left cerebellar infarct. Associated chronic blood products noted about these chronic infarcts. Additional chronic lacunar infarct present at the left thalamocapsular  region. No evidence for acute or subacute ischemia. Gray-white matter differentiation maintained. No acute intracranial hemorrhage. Innumerable punctate chronic micro hemorrhages noted involving both cerebral hemispheres, overall pattern nonspecific. No mass lesion, midline shift or mass effect. No hydrocephalus or extra-axial fluid collection. Pituitary gland and suprasellar region within normal limits. Vascular: Major intracranial vascular flow voids are maintained. Skull and upper cervical spine: Cranial junction within normal limits. Bone marrow signal intensity normal. No scalp soft tissue abnormality. Sinuses/Orbits: Prior bilateral ocular lens replacement. Paranasal sinuses are largely clear. Trace right mastoid effusion noted to left of significance. Other: None. MRA HEAD FINDINGS ANTERIOR CIRCULATION: Visualized distal cervical segments of both internal carotid arteries are patent with antegrade  flow. Atheromatous irregularity about the cavernous ICAs bilaterally without hemodynamically significant stenosis. 2 mm outpouching extending anteriorly from the cavernous right ICA consistent with a small aneurysm (series 5, image 77). Additional 3 mm outpouching extending posteriorly from the cavernous left ICA also suspicious for aneurysm (series 5, image 76). 3 mm outpouching extending inferiorly from the supraclinoid left ICA could reflect an additional small aneurysm (series 5, image 87). A1 segments patent bilaterally. Normal anterior communicating complex. Anterior cerebral arteries widely patent. No M1 stenosis or occlusion. 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 Distal MCA branches perfused and symmetric. POSTERIOR CIRCULATION: Both V4 segments are patent without stenosis. Right vertebral artery dominant. Right PICA patent at its origin. Left PICA origin not seen. Basilar patent without stenosis. Superior cerebellar and posterior cerebral arteries widely patent bilaterally.  MRA NECK FINDINGS AORTIC ARCH: Visualized aortic arch within normal limits for caliber with standard branch pattern. No visible stenosis about the origin of the great vessels. RIGHT CAROTID SYSTEM: Right common and internal carotid arteries are patent with antegrade flow. No evidence for dissection. Atheromatous irregularity about the right carotid bulb without hemodynamically significant greater than 50% stenosis. No ulceration. LEFT CAROTID SYSTEM: Left common and internal carotid arteries are patent with antegrade flow. No evidence for dissection. Atheromatous irregularity about the left carotid bulb without hemodynamically significant greater than 50% stenosis. No ulceration. VERTEBRAL ARTERIES: Both vertebral arteries arise from the subclavian arteries. No visible proximal subclavian artery stenosis. Right vertebral artery dominant. Vertebral arteries are patent with antegrade flow. No dissection or stenosis. IMPRESSION: MRI HEAD IMPRESSION: 1. No acute intracranial abnormality. 2. 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 3. Innumerable punctate chronic micro hemorrhages involving both cerebral hemispheres. Overall pattern is nonspecific, with differential considerations including sequelae of prior cardiac bypass, chronic hypertension, cerebral amyloid angiopathy, prior trauma, or possibly vasculitis. MRA HEAD IMPRESSION: 1. Negative intracranial MRA for large vessel occlusion. No hemodynamically significant or correctable stenosis. 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 5. 3 mm supraclinoid left ICA aneurysm. MRA NECK IMPRESSION: 1. No acute vascular abnormality within the neck. 2. Mild atheromatous irregularity about both carotid artery systems within the neck without hemodynamically significant greater than 50% stenosis. 3. Patent vertebral arteries within the neck.  Right vertebral artery dominant. Electronically Signed   By: Rise Mu M.D.   On: 08/13/2022 20:43   MR ANGIO NECK WO CONTRAST  Result Date: 08/13/2022 CLINICAL DATA:  Initial evaluation for headache, neuro deficit. EXAM: MRI HEAD WITHOUT CONTRAST MRA HEAD WITHOUT CONTRAST MRA NECK WITHOUT CONTRAST TECHNIQUE: Multiplanar, multiecho pulse sequences of the brain and surrounding structures were obtained without intravenous contrast. Angiographic images of the Circle of Willis were obtained using MRA technique without intravenous contrast. Angiographic images of the neck were obtained using MRA technique without intravenous contrast. Carotid stenosis measurements (when applicable) are obtained utilizing NASCET criteria, using the distal internal carotid diameter as the denominator. COMPARISON:  None Available. FINDINGS: MRI HEAD FINDINGS Brain: Cerebral volume within normal limits. Mild chronic microvascular ischemic disease for age. Remote lacunar infarct present at the anterior right basal ganglia. Additional small remote left cerebellar infarct. Associated chronic blood products noted about these chronic infarcts. Additional chronic lacunar infarct present at the left thalamocapsular region. No evidence for acute or subacute ischemia. Gray-white matter differentiation maintained. No acute intracranial hemorrhage. Innumerable punctate chronic micro  hemorrhages noted involving both cerebral hemispheres, overall pattern nonspecific. No mass lesion, midline shift or mass effect. No hydrocephalus or extra-axial fluid collection. Pituitary gland and suprasellar region within normal limits. Vascular: Major intracranial vascular flow voids are maintained. Skull and upper cervical spine: Cranial junction within normal limits. Bone marrow signal intensity normal. No scalp soft tissue abnormality. Sinuses/Orbits: Prior bilateral ocular lens replacement. Paranasal sinuses are largely clear. Trace right mastoid  effusion noted to left of significance. Other: None. MRA HEAD FINDINGS ANTERIOR CIRCULATION: Visualized distal cervical segments of both internal carotid arteries are patent with antegrade flow. Atheromatous irregularity about the cavernous ICAs bilaterally without hemodynamically significant stenosis. 2 mm outpouching extending anteriorly from the cavernous right ICA consistent with a small aneurysm (series 5, image 77). Additional 3 mm outpouching extending posteriorly from the cavernous left ICA also suspicious for aneurysm (series 5, image 76). 3 mm outpouching extending inferiorly from the supraclinoid left ICA could reflect an additional small aneurysm (series 5, image 87). A1 segments patent bilaterally. Normal anterior communicating complex. Anterior cerebral arteries widely patent. No M1 stenosis or occlusion. 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 Distal MCA branches perfused and symmetric. POSTERIOR CIRCULATION: Both V4 segments are patent without stenosis. Right vertebral artery dominant. Right PICA patent at its origin. Left PICA origin not seen. Basilar patent without stenosis. Superior cerebellar and posterior cerebral arteries widely patent bilaterally. MRA NECK FINDINGS AORTIC ARCH: Visualized aortic arch within normal limits for caliber with standard branch pattern. No visible stenosis about the origin of the great vessels. RIGHT CAROTID SYSTEM: Right common and internal carotid arteries are patent with antegrade flow. No evidence for dissection. Atheromatous irregularity about the right carotid bulb without hemodynamically significant greater than 50% stenosis. No ulceration. LEFT CAROTID SYSTEM: Left common and internal carotid arteries are patent with antegrade flow. No evidence for dissection. Atheromatous irregularity about the left carotid bulb without hemodynamically significant greater than 50% stenosis. No ulceration. VERTEBRAL ARTERIES: Both vertebral  arteries arise from the subclavian arteries. No visible proximal subclavian artery stenosis. Right vertebral artery dominant. Vertebral arteries are patent with antegrade flow. No dissection or stenosis. IMPRESSION: MRI HEAD IMPRESSION: 1. No acute intracranial abnormality. 2. 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 3. Innumerable punctate chronic micro hemorrhages involving both cerebral hemispheres. Overall pattern is nonspecific, with differential considerations including sequelae of prior cardiac bypass, chronic hypertension, cerebral amyloid angiopathy, prior trauma, or possibly vasculitis. MRA HEAD IMPRESSION: 1. Negative intracranial MRA for large vessel occlusion. No hemodynamically significant or correctable stenosis. 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 5. 3 mm supraclinoid left ICA aneurysm. MRA NECK IMPRESSION: 1. No acute vascular abnormality within the neck. 2. Mild atheromatous irregularity about both carotid artery systems within the neck without hemodynamically significant greater than 50% stenosis. 3. Patent vertebral arteries within the neck. Right vertebral artery dominant. Electronically Signed   By: Rise Mu M.D.   On: 08/13/2022 20:43    Micro Results    No results found for this or any previous visit (from the past 240 hour(s)).  Today   Subjective    Vivi Ferns today has no headache,no chest abdominal pain,no new weakness tingling or numbness, feels much better wants to go home today.    Objective   Blood pressure 137/70, pulse 83, temperature (!) 97.4 F (36.3 C), temperature source Temporal,  resp. rate 14, height 5\' 8"  (1.727 m), weight 65.7 kg, SpO2 100%.   Intake/Output Summary (Last 24 hours) at 08/16/2022 1158 Last data filed at 08/16/2022 1049 Gross per 24 hour  Intake 440 ml  Output 600 ml  Net -160 ml     Exam  Awake Alert, No new F.N deficits, near total vision loss in the left eye [place].AT,PERRAL Supple Neck,   Symmetrical Chest wall movement, Good air movement bilaterally, CTAB iRRR,No Gallops,   +ve B.Sounds, Abd Soft, Non tender,  No Cyanosis, Clubbing or edema    Data Review   Recent Labs  Lab 08/12/22 1132 08/13/22 1340 08/14/22 0317 08/15/22 0241 08/16/22 0710  WBC 6.2 7.0 4.1 3.5* 4.0  HGB 9.7* 9.5* 9.4* 9.1* 9.0*  HCT 29.5* 30.7* 29.1* 27.8* 28.0*  PLT 228 274 183 168 181  MCV 94.9 98.1 99.3 93.6 94.6  MCH 31.2 30.4 32.1 30.6 30.4  MCHC 32.9 30.9 32.3 32.7 32.1  RDW 14.3 15.4 15.2 15.0 15.2  LYMPHSABS 1,004 1.3 0.3* 0.4* 0.3*  MONOABS  --  0.5 0.1 0.1 0.2  EOSABS 130 0.1 0.0 0.0 0.0  BASOSABS 19 0.0 0.0 0.0 0.0    Recent Labs  Lab 08/12/22 1132 08/13/22 1340 08/14/22 0317 08/15/22 0241 08/16/22 0710  NA  --  140 137 138 137  K  --  3.9 4.3 3.9 4.2  CL  --  106 104 105 105  CO2  --  23 22 23 26   ANIONGAP  --  11 11 10 6   GLUCOSE  --  169* 177* 173* 190*  BUN  --  10 13 15 18   CREATININE  --  1.13 1.12 1.05 1.20  AST  --   --  14* 17 20  ALT  --   --  14 15 14   ALKPHOS  --   --  50 53 43  BILITOT  --   --  1.2 1.0 0.6  ALBUMIN  --   --  2.9* 3.1* 2.8*  CRP 56.3*  --  5.5* 3.7* 1.2*  TSH  --   --  1.145  --   --   HGBA1C  --   --  5.8*  --   --   BNP  --   --  690.7* 751.5* 565.9*  MG  --   --  2.2 2.2 2.2  CALCIUM  --  8.9 8.7* 8.9 8.6*    Total Time in preparing paper work, data evaluation and todays exam - 35 minutes  Signature  -    Susa Raring M.D on 08/16/2022 at 11:58 AM   -  To page go to [email or web address]

## 2022-08-16 NOTE — CV Procedure (Signed)
   Transesophageal Echocardiogram  Indications: Atrial fibrillation  Time out performed  During this procedure the patient was administered propofol under anesthesiology supervision to achieve and maintain moderate sedation.  The patient's heart rate, blood pressure, and oxygen saturation are monitored continuously during the procedure.   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  Performed  Procedure Details  The patient was NPO after midnight. Anesthesia was administered at the beside  by Dr.Germeroth with propofol.  Cardioversion was performed with synchronized biphasic defibrillation via AP pads with 200 joules.  1 attempt(s) were performed.  The patient converted to normal sinus rhythm. The patient tolerated the procedure well   IMPRESSION:  Successful cardioversion of atrial fibrillation    [name] 08/16/2022, 10:55 AM

## 2022-08-16 NOTE — TOC CM/SW Note (Signed)
Transition of Care [facility]) - Inpatient Brief Assessment   Patient Details  Name: ZACOREY SASO MRN: [id number] Date of Birth: 1940-04-01  Transition of Care [facility]) CM/SW Contact:    Gala Lewandowsky, RN Phone Number: 08/16/2022, 12:49 PM   Clinical Narrative: Patient presented for temporal arteritis. PTA patient was from home with spouse. Patient is agreeable to outpatient PT and he wants the referral submitted to the [facility] location. Ambulatory referral submitted via Epic to [facility] location. Eliquis co pay card provided to the patient. Spouse to provide transportation home. No further needs identified.    Transition of Care Asessment: Insurance and Status: Insurance coverage has been reviewed Patient has primary care physician: Yes Home environment has been reviewed: reviewed Prior/Current Home Services: No current home services Social Determinants of Health Reivew: SDOH reviewed no interventions necessary Readmission risk has been reviewed: Yes Transition of care needs: no transition of care needs at this time

## 2022-08-16 NOTE — Discharge Instructions (Signed)
Wear a left eye patch while driving.  You can wear it in regular times as well if you have problems with double vision.  Follow with Primary MD [name], MD in 7 days, follow-up with the ophthalmologist and your rheumatologist within 7 to 10 days of discharge  Get CBC, CMP, 2 view Chest X ray -  checked next visit with your primary MD    Activity: As tolerated with Full fall precautions use walker/cane & assistance as needed  Disposition Home    Diet: Heart Healthy Low Carb  Accuchecks 4 times/day, Once in AM empty stomach and then before each meal. Log in all results and show them to your Prim.MD in 3 days. If any glucose reading is under 80 or above 300 call your Prim MD immidiately. Follow Low glucose instructions for glucose under 80 as instructed.   Special Instructions: If you have smoked or chewed Tobacco  in the last 2 yrs please stop smoking, stop any regular Alcohol  and or any Recreational drug use.  On your next visit with your primary care physician please Get Medicines reviewed and adjusted.  Please request your Prim.MD to go over all Hospital Tests and Procedure/Radiological results at the follow up, please get all Hospital records sent to your Prim MD by signing hospital release before you go home.  If you experience worsening of your admission symptoms, develop shortness of breath, life threatening emergency, suicidal or homicidal thoughts you must seek medical attention immediately by calling 911 or calling your MD immediately  if symptoms less severe.  You Must read complete instructions/literature along with all the possible adverse reactions/side effects for all the Medicines you take and that have been prescribed to you. Take any new Medicines after you have completely understood and accpet all the possible adverse reactions/side effects.

## 2022-08-16 NOTE — Progress Notes (Addendum)
Rounding Note    Patient Name: Donald Rios Date of Encounter: 08/16/2022  [facility] Cardiologist: Chrystie Nose, MD   Subjective   No complaints  Inpatient Medications    Scheduled Meds:  apixaban  5 mg Oral BID   aspirin  81 mg Oral Daily   docusate sodium  100 mg Oral BID   doxazosin  2 mg Oral Daily   insulin aspart  0-5 Units Subcutaneous QHS   insulin aspart  0-9 Units Subcutaneous TID WC   metoprolol tartrate  75 mg Oral Q6H   mirtazapine  15 mg Oral QHS   pantoprazole  40 mg Oral Daily   rosuvastatin  20 mg Oral Daily   Continuous Infusions:  sodium chloride Stopped (08/15/22 1104)   sodium chloride     methylPREDNISolone (SOLU-MEDROL) injection 1,000 mg (08/15/22 0845)   PRN Meds: sodium chloride, acetaminophen **OR** acetaminophen, diltiazem, LORazepam, ondansetron **OR** ondansetron (ZOFRAN) IV   Vital Signs    Vitals:   08/15/22 1214 08/15/22 1807 08/15/22 2045 08/16/22 0434  BP: 126/60 113/70 111/72 (!) 115/56  Pulse: 73 65 76 74  Resp: 18  19 18   Temp: 98.2 F (36.8 C)  97.7 F (36.5 C) 97.7 F (36.5 C)  TempSrc: Oral  Oral Oral  SpO2:   97%   Weight:      Height:        Intake/Output Summary (Last 24 hours) at 08/16/2022 0734 Last data filed at 08/15/2022 2300 Gross per 24 hour  Intake 780 ml  Output 1450 ml  Net -670 ml      08/13/2022   11:17 PM 08/13/2022    2:30 PM 07/30/2022    2:14 PM  Last 3 Weights  Weight (lbs) 144 lb 14.4 oz 150 lb 152 lb 9.6 oz  Weight (kg) 65.726 kg 68.04 kg 69.219 kg      Telemetry    A-fib HR- 70s-80s - Personally Reviewed  ECG    No new tracing   Physical Exam   GEN: No acute distress.   Cardiac: Irregularly irregular, no murmurs, rubs, or gallops.  Respiratory: Clear to auscultation bilaterally. GI: Soft, nontender, non-distended  MS: No edema; No deformity. Neuro:  Nonfocal  Psych: Normal affect   Labs     Chemistry Recent Labs  Lab 08/13/22 1340 08/14/22 0317  08/15/22 0241  NA 140 137 138  K 3.9 4.3 3.9  CL 106 104 105  CO2 23 22 23   GLUCOSE 169* 177* 173*  BUN 10 13 15   CREATININE 1.13 1.12 1.05  CALCIUM 8.9 8.7* 8.9  MG  --  2.2 2.2  PROT  --  5.6* 5.9*  ALBUMIN  --  2.9* 3.1*  AST  --  14* 17  ALT  --  14 15  ALKPHOS  --  50 53  BILITOT  --  1.2 1.0  GFRNONAA >60 >60 >60  ANIONGAP 11 11 10     Lipids  Recent Labs  Lab 08/14/22 0317  CHOL 93  TRIG 36  HDL 34*  LDLCALC 52  CHOLHDL 2.7    Hematology Recent Labs  Lab 08/13/22 1340 08/14/22 0317 08/15/22 0241  WBC 7.0 4.1 3.5*  RBC 3.13* 2.93* 2.97*  HGB 9.5* 9.4* 9.1*  HCT 30.7* 29.1* 27.8*  MCV 98.1 99.3 93.6  MCH 30.4 32.1 30.6  MCHC 30.9 32.3 32.7  RDW 15.4 15.2 15.0  PLT 274 183 168   Thyroid  Recent Labs  Lab 08/14/22 0317  TSH 1.145    BNP Recent Labs  Lab 08/14/22 0317 08/15/22 0241  BNP 690.7* 751.5*     Radiology    ECHOCARDIOGRAM COMPLETE  Result Date: 08/14/2022    ECHOCARDIOGRAM REPORT   Patient Name:   JONANTHONY LIBERTINI Date of Exam: 08/14/2022 Medical Rec #:  [id number]      Height:       68.0 in Accession #:    [id number]     Weight:       144.9 lb Date of Birth:  26-Oct-1940      BSA:          1.782 m Patient Age:    82 years       BP:           111/75 mmHg Patient Gender: M              HR:           107 bpm. Exam Location:  Inpatient Procedure: 2D Echo, Cardiac Doppler, Color Doppler and Intracardiac            Opacification Agent Indications:    CHF-Acute Diastolic I50.31  History:        Patient has prior history of Echocardiogram examinations, most                 recent 10/02/2019. Cardiomyopathy and CHF, CAD, Prior CABG; Risk                 Factors:Hypertension, Diabetes and Dyslipidemia.  Sonographer:    Lucendia Herrlich Referring Phys: Effie Shy PRASHANT K [facility] IMPRESSIONS  1. Left ventricular ejection fraction, by estimation, is 45 to 50%. The left ventricle has mildly decreased function. The left ventricle demonstrates global hypokinesis. There  is mild left ventricular hypertrophy. Left ventricular diastolic function could not be evaluated. Elevated left ventricular end-diastolic pressure.  2. Right ventricular systolic function is normal. The right ventricular size is normal. There is severely elevated pulmonary artery systolic pressure. The estimated right ventricular systolic pressure is 62.2 mmHg.  3. Left atrial size was severely dilated.  4. The mitral valve is normal in structure. Mild to moderate mitral valve regurgitation. No evidence of mitral stenosis.  5. The tricuspid valve is abnormal. Tricuspid valve regurgitation is mild to moderate.  6. The aortic valve is tricuspid. Aortic valve regurgitation is not visualized. No aortic stenosis is present.  7. The inferior vena cava is dilated in size with >50% respiratory variability, suggesting right atrial pressure of 8 mmHg. Comparison(s): Changes from prior study are noted. LVEF worsened to 45-50% from 60-65% in 2021. FINDINGS  Left Ventricle: Left ventricular ejection fraction, by estimation, is 45 to 50%. The left ventricle has mildly decreased function. The left ventricle demonstrates global hypokinesis. Definity contrast agent was given IV to delineate the left ventricular  endocardial borders. The left ventricular internal cavity size was normal in size. There is mild left ventricular hypertrophy. Left ventricular diastolic function could not be evaluated due to atrial fibrillation. Left ventricular diastolic function could not be evaluated. Elevated left ventricular end-diastolic pressure. Right Ventricle: The right ventricular size is normal. No increase in right ventricular wall thickness. Right ventricular systolic function is normal. There is severely elevated pulmonary artery systolic pressure. The tricuspid regurgitant velocity is 3.68 m/s, and with an assumed right atrial pressure of 8 mmHg, the estimated right ventricular systolic pressure is 62.2 mmHg. Left Atrium: Left atrial size  was severely dilated. Right Atrium: Right atrial size was normal in  size. Pericardium: There is no evidence of pericardial effusion. Mitral Valve: The mitral valve is normal in structure. Mild to moderate mitral valve regurgitation. No evidence of mitral valve stenosis. Tricuspid Valve: The tricuspid valve is abnormal. Tricuspid valve regurgitation is mild to moderate. No evidence of tricuspid stenosis. Aortic Valve: The aortic valve is tricuspid. Aortic valve regurgitation is not visualized. No aortic stenosis is present. Aortic valve peak gradient measures 7.6 mmHg. Pulmonic Valve: The pulmonic valve was normal in structure. Pulmonic valve regurgitation is trivial. No evidence of pulmonic stenosis. Aorta: The aortic root and ascending aorta are structurally normal, with no evidence of dilitation. Venous: The inferior vena cava is dilated in size with greater than 50% respiratory variability, suggesting right atrial pressure of 8 mmHg. IAS/Shunts: No atrial level shunt detected by color flow Doppler.  LEFT VENTRICLE PLAX 2D LVIDd:         4.50 cm   Diastology LVIDs:         3.70 cm   LV e' medial:    6.96 cm/s LV PW:         1.25 cm   LV E/e' medial:  20.0 LV IVS:        1.20 cm   LV e' lateral:   10.90 cm/s LVOT diam:     2.20 cm   LV E/e' lateral: 12.8 LV SV:         56 LV SV Index:   31 LVOT Area:     3.80 cm  RIGHT VENTRICLE            IVC RV S prime:     9.90 cm/s  IVC diam: 2.40 cm TAPSE (M-mode): 1.5 cm LEFT ATRIUM             Index        RIGHT ATRIUM           Index LA diam:        5.90 cm 3.31 cm/m   RA Area:     20.10 cm LA Vol (A2C):   85.5 ml 47.98 ml/m  RA Volume:   52.70 ml  29.57 ml/m LA Vol (A4C):   89.6 ml 50.28 ml/m LA Biplane Vol: 90.0 ml 50.50 ml/m  AORTIC VALVE                 PULMONIC VALVE AV Area (Vmax): 2.59 cm     PR End Diast Vel: 6.81 msec AV Vmax:        138.00 cm/s AV Peak Grad:   7.6 mmHg LVOT Vmax:      94.10 cm/s LVOT Vmean:     63.233 cm/s LVOT VTI:       0.148 m  AORTA Ao  Root diam: 3.20 cm Ao Asc diam:  2.20 cm MITRAL VALVE                TRICUSPID VALVE MV Area (PHT): 4.96 cm     TR Peak grad:   54.2 mmHg MV Decel Time: 153 msec     TR Vmax:        368.00 cm/s MR Peak grad: 114.9 mmHg MR Vmax:      536.00 cm/s   SHUNTS MV E velocity: 139.00 cm/s  Systemic VTI:  0.15 m                             Systemic Diam: 2.20 cm Vishnu Priya Mallipeddi Electronically signed by  Vishnu Priya Mallipeddi Signature Date/Time: 08/14/2022/11:25:17 AM    Final    VAS [place] TEMPORAL ARTERY BILATERAL  Result Date: 08/14/2022  TEMPORAL ARTERY REPORT Patient Name:  DONIVON EULBERG  Date of Exam:   08/14/2022 Medical Rec #: [id number]       Accession #:    [id number] Date of Birth: 04/18/1940       Patient Gender: M Patient Age:   38 years Exam Location:  [facility] Procedure:      VAS [place] TEMPORAL ARTERY BILATERAL Referring Phys: MCNEILL KIRKPATRICK --------------------------------------------------------------------------------  Indications: Jaw pain, blurred vision , abnormal lab values and Ophthalmologist              noted edema of disc, sent patient for Labs, ESR and CRP elevated.              This study was technically difficult secondary to patient movement,              coughing, and talking. High Risk Factors: Age > 50 yrs.  Comparison Study: No prior study on file Performing Technologist: Sherren Kerns RVS  Examination Guidelines: Patient in reclined position. 2D, color and spectral doppler sampling in the temporal artery along the hairline and temple in the longitudinal plane. 2D images along the hairline and temple in the transverse plane. Exam is bilateral.  ++-------------------+------------------+ Right Halo POSITIVELeft Halo POSITIVE ++-------------------+------------------+ +--------+----------+-----------+----------+-----------+         Width (cm)Length (cm)Width (cm)Length (cm) +--------+----------+-----------+----------+-----------+ Proximal0.15                 0.13                   +--------+----------+-----------+----------+-----------+ Mid     0.14                 0.10                  +--------+----------+-----------+----------+-----------+ Distal  0.13                 0.06                  +--------+----------+-----------+----------+-----------+ Technically difficult and limited study, see indications above. No compression of bilateral temporal arteries at proximal, mid, or distal segments. Abnormal flow noted throughout. 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  *See table(s) above for measurements and observations.  Suggest Peripheral Vascular Consult.   Preliminary     Cardiac Studies  Echo 08/14/22  IMPRESSIONS    1. Left ventricular ejection fraction, by estimation, is 45 to 50%. The  left ventricle has mildly decreased function. The left ventricle  demonstrates global hypokinesis. There is mild left ventricular  hypertrophy. Left ventricular diastolic function  could not be evaluated. Elevated left ventricular end-diastolic pressure.   2. Right ventricular systolic function is normal. The right ventricular  size is normal. There is severely elevated pulmonary artery systolic  pressure. The estimated right ventricular systolic pressure is 62.2 mmHg.   3. Left atrial size was severely dilated.   4. The mitral valve is normal in structure. Mild to moderate mitral valve  regurgitation. No evidence of mitral stenosis.   5. The tricuspid valve is abnormal. Tricuspid valve regurgitation is mild  to moderate.   6. The aortic valve is tricuspid. Aortic valve regurgitation is not  visualized. No aortic stenosis is present.   7. The inferior vena cava is dilated in size with >50%  respiratory  variability, suggesting right atrial pressure of 8 mmHg.   Comparison(s): Changes from prior study are noted. LVEF worsened to 45-50%  from 60-65% in 2021.   Patient Profile     82 y.o. 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 Thedore Mins.   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  His EF also was newly depressed.  He has a history of remote CAD 24 years ago.  Patient was on heparin and has received Eliquis (3 doses).  For TEE guided DC cardioversion today.  Runell Gess, M.D., FACP, [facility], Earl Lagos [facility] [address] [place], [place]  [address]  [phone] 08/16/2022 9:55 AM   Addendum: Patient has successful TEE guided DC cardioversion this morning by Dr. Anne Fu to sinus rhythm.  He is clinically stable.  His EF however was lower than the transthoracic echo done earlier in hospitalization, now at 30% level.  On beta-blocker.  Agree with adding a low-dose ARB.  Will arrange close outpatient follow-up.  Otherwise, stable for discharge.  He is on Eliquis for  admission.  Runell Gess, M.D., FACP, [facility], Earl [place]os [facility] [address] [place], [place]  [address]  [phone] 08/16/2022 1:11 PM

## 2022-08-16 NOTE — Inpatient Diabetes Management (Signed)
Inpatient Diabetes Program Recommendations  AACE/ADA: New Consensus Statement on Inpatient Glycemic Control   Target Ranges:  Prepandial:   less than 140 mg/dL      Peak postprandial:   less than 180 mg/dL (1-2 hours)      Critically ill patients:  140 - 180 mg/dL    Latest Reference Range & Units 05/14/22 08:36 08/14/22 03:17  Hemoglobin A1C 4.8 - 5.6 % 5.6 5.8 (H)    Latest Reference Range & Units 08/15/22 08:01 08/15/22 12:19 08/15/22 16:54 08/15/22 21:14 08/16/22 07:55  Glucose-Capillary 70 - 99 mg/dL 161 (H) 096 (H) 045 (H) 190 (H) 168 (H)   Review of Glycemic Control  Diabetes history: DM2 Outpatient Diabetes medications: None Current orders for Inpatient glycemic control: Novolog 0-9 units TID with meals, Novolog 0-5 units QHS  NOTE: Received page from nurse on 6E. RN reports patient is currently in procedure (cardioversion) and will be discharged today new to insulin and provider has requested that patient be educated. Per chart review patient has DM2 hx, does not take any DM medication outpatient, and most current A1C 5.8% on 08/14/22 indicating an average glucose of 120 mg/dl. Per discharge orders, patient is being discharged on Fiasp 0-10 units TID with meals (dose based on glucose). Asked RN to repage diabetes coordinator when patient arrives back to unit. Ordered Living Well with DM book and insulin pen starter kit.   Thanks, Orlando Penner, RN, MSN, CDCES Diabetes Coordinator Inpatient Diabetes Program [phone] (Team Pager from 8am to 5pm)

## 2022-08-16 NOTE — Interval H&P Note (Signed)
History and Physical Interval Note:  08/16/2022 10:15 AM  Donald Rios  has presented today for surgery, with the diagnosis of afib.  The various methods of treatment have been discussed with the patient and family. After consideration of risks, benefits and other options for treatment, the patient has consented to  Procedure(s): TRANSESOPHAGEAL ECHOCARDIOGRAM (N/A) CARDIOVERSION (N/A) as a surgical intervention.  The patient's history has been reviewed, patient examined, no change in status, stable for surgery.  I have reviewed the patient's chart and labs.  Questions were answered to the patient's satisfaction.     [employer]

## 2022-08-16 NOTE — H&P (View-Only) (Signed)
Rounding Note    Patient Name: Donald Rios Date of Encounter: 08/16/2022  [facility] Cardiologist: Chrystie Nose, MD   Subjective   No complaints  Inpatient Medications    Scheduled Meds:  apixaban  5 mg Oral BID   aspirin  81 mg Oral Daily   docusate sodium  100 mg Oral BID   doxazosin  2 mg Oral Daily   insulin aspart  0-5 Units Subcutaneous QHS   insulin aspart  0-9 Units Subcutaneous TID WC   metoprolol tartrate  75 mg Oral Q6H   mirtazapine  15 mg Oral QHS   pantoprazole  40 mg Oral Daily   rosuvastatin  20 mg Oral Daily   Continuous Infusions:  sodium chloride Stopped (08/15/22 1104)   sodium chloride     methylPREDNISolone (SOLU-MEDROL) injection 1,000 mg (08/15/22 0845)   PRN Meds: sodium chloride, acetaminophen **OR** acetaminophen, diltiazem, LORazepam, ondansetron **OR** ondansetron (ZOFRAN) IV   Vital Signs    Vitals:   08/15/22 1214 08/15/22 1807 08/15/22 2045 08/16/22 0434  BP: 126/60 113/70 111/72 (!) 115/56  Pulse: 73 65 76 74  Resp: 18  19 18   Temp: 98.2 F (36.8 C)  97.7 F (36.5 C) 97.7 F (36.5 C)  TempSrc: Oral  Oral Oral  SpO2:   97%   Weight:      Height:        Intake/Output Summary (Last 24 hours) at 08/16/2022 0734 Last data filed at 08/15/2022 2300 Gross per 24 hour  Intake 780 ml  Output 1450 ml  Net -670 ml      08/13/2022   11:17 PM 08/13/2022    2:30 PM 07/30/2022    2:14 PM  Last 3 Weights  Weight (lbs) 144 lb 14.4 oz 150 lb 152 lb 9.6 oz  Weight (kg) 65.726 kg 68.04 kg 69.219 kg      Telemetry    A-fib HR- 70s-80s - Personally Reviewed  ECG    No new tracing   Physical Exam   GEN: No acute distress.   Cardiac: Irregularly irregular, no murmurs, rubs, or gallops.  Respiratory: Clear to auscultation bilaterally. GI: Soft, nontender, non-distended  MS: No edema; No deformity. Neuro:  Nonfocal  Psych: Normal affect   Labs     Chemistry Recent Labs  Lab 08/13/22 1340 08/14/22 0317  08/15/22 0241  NA 140 137 138  K 3.9 4.3 3.9  CL 106 104 105  CO2 23 22 23   GLUCOSE 169* 177* 173*  BUN 10 13 15   CREATININE 1.13 1.12 1.05  CALCIUM 8.9 8.7* 8.9  MG  --  2.2 2.2  PROT  --  5.6* 5.9*  ALBUMIN  --  2.9* 3.1*  AST  --  14* 17  ALT  --  14 15  ALKPHOS  --  50 53  BILITOT  --  1.2 1.0  GFRNONAA >60 >60 >60  ANIONGAP 11 11 10     Lipids  Recent Labs  Lab 08/14/22 0317  CHOL 93  TRIG 36  HDL 34*  LDLCALC 52  CHOLHDL 2.7    Hematology Recent Labs  Lab 08/13/22 1340 08/14/22 0317 08/15/22 0241  WBC 7.0 4.1 3.5*  RBC 3.13* 2.93* 2.97*  HGB 9.5* 9.4* 9.1*  HCT 30.7* 29.1* 27.8*  MCV 98.1 99.3 93.6  MCH 30.4 32.1 30.6  MCHC 30.9 32.3 32.7  RDW 15.4 15.2 15.0  PLT 274 183 168   Thyroid  Recent Labs  Lab 08/14/22 0317  TSH 1.145    BNP Recent Labs  Lab 08/14/22 0317 08/15/22 0241  BNP 690.7* 751.5*     Radiology    ECHOCARDIOGRAM COMPLETE  Result Date: 08/14/2022    ECHOCARDIOGRAM REPORT   Patient Name:   Donald Rios Date of Exam: 08/14/2022 Medical Rec #:  [id number]      Height:       68.0 in Accession #:    [id number]     Weight:       144.9 lb Date of Birth:  [birth year]      BSA:          1.782 m Patient Age:    81 years       BP:           111/75 mmHg Patient Gender: M              HR:           107 bpm. Exam Location:  Inpatient Procedure: 2D Echo, Cardiac Doppler, Color Doppler and Intracardiac            Opacification Agent Indications:    CHF-Acute Diastolic I50.31  History:        Patient has prior history of Echocardiogram examinations, most                 recent 10/02/2019. Cardiomyopathy and CHF, CAD, Prior CABG; Risk                 Factors:Hypertension, Diabetes and Dyslipidemia.  Sonographer:    Lucendia Herrlich Referring Phys: Effie Shy PRASHANT [facility] IMPRESSIONS  1. Left ventricular ejection fraction, by estimation, is 45 to 50%. The left ventricle has mildly decreased function. The left ventricle demonstrates global hypokinesis. There  is mild left ventricular hypertrophy. Left ventricular diastolic function could not be evaluated. Elevated left ventricular end-diastolic pressure.  2. Right ventricular systolic function is normal. The right ventricular size is normal. There is severely elevated pulmonary artery systolic pressure. The estimated right ventricular systolic pressure is 62.2 mmHg.  3. Left atrial size was severely dilated.  4. The mitral valve is normal in structure. Mild to moderate mitral valve regurgitation. No evidence of mitral stenosis.  5. The tricuspid valve is abnormal. Tricuspid valve regurgitation is mild to moderate.  6. The aortic valve is tricuspid. Aortic valve regurgitation is not visualized. No aortic stenosis is present.  7. The inferior vena cava is dilated in size with >50% respiratory variability, suggesting right atrial pressure of 8 mmHg. Comparison(s): Changes from prior study are noted. LVEF worsened to 45-50% from 60-65% in 2021. FINDINGS  Left Ventricle: Left ventricular ejection fraction, by estimation, is 45 to 50%. The left ventricle has mildly decreased function. The left ventricle demonstrates global hypokinesis. Definity contrast agent was given IV to delineate the left ventricular  endocardial borders. The left ventricular internal cavity size was normal in size. There is mild left ventricular hypertrophy. Left ventricular diastolic function could not be evaluated due to atrial fibrillation. Left ventricular diastolic function could not be evaluated. Elevated left ventricular end-diastolic pressure. Right Ventricle: The right ventricular size is normal. No increase in right ventricular wall thickness. Right ventricular systolic function is normal. There is severely elevated pulmonary artery systolic pressure. The tricuspid regurgitant velocity is 3.68 m/s, and with an assumed right atrial pressure of 8 mmHg, the estimated right ventricular systolic pressure is 62.2 mmHg. Left Atrium: Left atrial size  was severely dilated. Right Atrium: Right atrial size was normal in  size. Pericardium: There is no evidence of pericardial effusion. Mitral Valve: The mitral valve is normal in structure. Mild to moderate mitral valve regurgitation. No evidence of mitral valve stenosis. Tricuspid Valve: The tricuspid valve is abnormal. Tricuspid valve regurgitation is mild to moderate. No evidence of tricuspid stenosis. Aortic Valve: The aortic valve is tricuspid. Aortic valve regurgitation is not visualized. No aortic stenosis is present. Aortic valve peak gradient measures 7.6 mmHg. Pulmonic Valve: The pulmonic valve was normal in structure. Pulmonic valve regurgitation is trivial. No evidence of pulmonic stenosis. Aorta: The aortic root and ascending aorta are structurally normal, with no evidence of dilitation. Venous: The inferior vena cava is dilated in size with greater than 50% respiratory variability, suggesting right atrial pressure of 8 mmHg. IAS/Shunts: No atrial level shunt detected by color flow Doppler.  LEFT VENTRICLE PLAX 2D LVIDd:         4.50 cm   Diastology LVIDs:         3.70 cm   LV e' medial:    6.96 cm/s LV PW:         1.25 cm   LV E/e' medial:  20.0 LV IVS:        1.20 cm   LV e' lateral:   10.90 cm/s LVOT diam:     2.20 cm   LV E/e' lateral: 12.8 LV SV:         56 LV SV Index:   31 LVOT Area:     3.80 cm  RIGHT VENTRICLE            IVC RV S prime:     9.90 cm/s  IVC diam: 2.40 cm TAPSE (M-mode): 1.5 cm LEFT ATRIUM             Index        RIGHT ATRIUM           Index LA diam:        5.90 cm 3.31 cm/m   RA Area:     20.10 cm LA Vol (A2C):   85.5 ml 47.98 ml/m  RA Volume:   52.70 ml  29.57 ml/m LA Vol (A4C):   89.6 ml 50.28 ml/m LA Biplane Vol: 90.0 ml 50.50 ml/m  AORTIC VALVE                 PULMONIC VALVE AV Area (Vmax): 2.59 cm     PR End Diast Vel: 6.81 msec AV Vmax:        138.00 cm/s AV Peak Grad:   7.6 mmHg LVOT Vmax:      94.10 cm/s LVOT Vmean:     63.233 cm/s LVOT VTI:       0.148 m  AORTA Ao  Root diam: 3.20 cm Ao Asc diam:  2.20 cm MITRAL VALVE                TRICUSPID VALVE MV Area (PHT): 4.96 cm     TR Peak grad:   54.2 mmHg MV Decel Time: 153 msec     TR Vmax:        368.00 cm/s MR Peak grad: 114.9 mmHg MR Vmax:      536.00 cm/s   SHUNTS MV E velocity: 139.00 cm/s  Systemic VTI:  0.15 m                             Systemic Diam: 2.20 cm Vishnu Priya Mallipeddi Electronically signed by  Vishnu Priya Mallipeddi Signature Date/Time: 08/14/2022/11:25:17 AM    Final    VAS [place] TEMPORAL ARTERY BILATERAL  Result Date: 08/14/2022  TEMPORAL ARTERY REPORT Patient Name:  DIVYANSH LYMON  Date of Exam:   08/14/2022 Medical Rec #: [id number]       Accession #:    [id number] Date of Birth: 09-11-1940       Patient Gender: M Patient Age:   19 years Exam Location:  [facility] Procedure:      VAS [place] TEMPORAL ARTERY BILATERAL Referring Phys: MCNEILL KIRKPATRICK --------------------------------------------------------------------------------  Indications: Jaw pain, blurred vision , abnormal lab values and Ophthalmologist              noted edema of disc, sent patient for Labs, ESR and CRP elevated.              This study was technically difficult secondary to patient movement,              coughing, and talking. High Risk Factors: Age > 50 yrs.  Comparison Study: No prior study on file Performing Technologist: Sherren Kerns RVS  Examination Guidelines: Patient in reclined position. 2D, color and spectral doppler sampling in the temporal artery along the hairline and temple in the longitudinal plane. 2D images along the hairline and temple in the transverse plane. Exam is bilateral.  ++-------------------+------------------+ Right Halo POSITIVELeft Halo POSITIVE ++-------------------+------------------+ +--------+----------+-----------+----------+-----------+         Width (cm)Length (cm)Width (cm)Length (cm) +--------+----------+-----------+----------+-----------+ Proximal0.15                 0.13                   +--------+----------+-----------+----------+-----------+ Mid     0.14                 0.10                  +--------+----------+-----------+----------+-----------+ Distal  0.13                 0.06                  +--------+----------+-----------+----------+-----------+ Technically difficult and limited study, see indications above. No compression of bilateral temporal arteries at proximal, mid, or distal segments. Abnormal flow noted throughout. 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  *See table(s) above for measurements and observations.  Suggest Peripheral Vascular Consult.   Preliminary     Cardiac Studies  Echo 08/14/22  IMPRESSIONS    1. Left ventricular ejection fraction, by estimation, is 45 to 50%. The  left ventricle has mildly decreased function. The left ventricle  demonstrates global hypokinesis. There is mild left ventricular  hypertrophy. Left ventricular diastolic function  could not be evaluated. Elevated left ventricular end-diastolic pressure.   2. Right ventricular systolic function is normal. The right ventricular  size is normal. There is severely elevated pulmonary artery systolic  pressure. The estimated right ventricular systolic pressure is 62.2 mmHg.   3. Left atrial size was severely dilated.   4. The mitral valve is normal in structure. Mild to moderate mitral valve  regurgitation. No evidence of mitral stenosis.   5. The tricuspid valve is abnormal. Tricuspid valve regurgitation is mild  to moderate.   6. The aortic valve is tricuspid. Aortic valve regurgitation is not  visualized. No aortic stenosis is present.   7. The inferior vena cava is dilated in size with >50%  respiratory  variability, suggesting right atrial pressure of 8 mmHg.   Comparison(s): Changes from prior study are noted. LVEF worsened to 45-50%  from 60-65% in 2021.   Patient Profile     82 y.o. 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 Thedore Mins.   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  His EF also was newly depressed.  He has a history of remote CAD 24 years ago.  Patient was on heparin and has received Eliquis (3 doses).  For TEE guided DC cardioversion today.  Runell Gess, M.D., FACP, [facility], Earl Lagos [facility] [address] [place], [place]  [address]  [phone] 08/16/2022 9:55 AM

## 2022-08-17 ENCOUNTER — Encounter (HOSPITAL_COMMUNITY): Payer: Self-pay | Admitting: Cardiology

## 2022-08-17 ENCOUNTER — Telehealth: Payer: Self-pay

## 2022-08-17 NOTE — Anesthesia Postprocedure Evaluation (Signed)
Anesthesia Post Note  Patient: Donald Rios  Procedure(s) Performed: TRANSESOPHAGEAL ECHOCARDIOGRAM CARDIOVERSION     Patient location during evaluation: PACU Anesthesia Type: MAC Level of consciousness: awake and alert Pain management: pain level controlled Vital Signs Assessment: post-procedure vital signs reviewed and stable Respiratory status: spontaneous breathing Cardiovascular status: stable Anesthetic complications: no   No notable events documented.  Last Vitals:  Vitals:   08/16/22 1135 08/16/22 1158  BP: 137/70 (!) 152/80  Pulse: 83 100  Resp: 14 15  Temp:  36.4 C  SpO2: 100% 97%    Last Pain:  Vitals:   08/16/22 1158  TempSrc: Oral  PainSc:                  Lewie Loron

## 2022-08-17 NOTE — Transitions of Care (Post Inpatient/ED Visit) (Signed)
08/17/2022  Name: Donald Rios MRN: [id number] DOB: 01-19-1940  Today's TOC FU Call Status: Today's TOC FU Call Status:: Successful TOC FU Call Completed TOC FU Call Complete Date: 08/17/22  Transition Care Management Follow-up Telephone Call Date of Discharge: 08/16/22 Discharge Facility: [facility]) Type of Discharge: Inpatient Admission Primary Inpatient Discharge Diagnosis:: "temporal arteritis" How have you been since you were released from the hospital?: Better (Pt states he is doing well-"getting ready to go out to eat with family"-appetite remains decreased-"only really eating soft foods." Having blurry vision-eye appt Thurs. Denies any pain and/or cardiac issues.) Any questions or concerns?: No  Items Reviewed: Did you receive and understand the discharge instructions provided?: Yes Medications obtained,verified, and reconciled?: Yes (Medications Reviewed) Any new allergies since your discharge?: No Dietary orders reviewed?: Yes Type of Diet Ordered:: low salt/heart healthy/carb modified Do you have support at home?: Yes People in Home: spouse, child(ren), adult Name of Support/Comfort Primary Source: Patricia-wife and son helping him out  Medications Reviewed Today: Medications Reviewed Today     Reviewed by Charlyn Minerva, RN (Registered Nurse) on 08/17/22 at 1455  Med List Status: <None>   Medication Order Taking? Sig Documenting Provider Last Dose Status Informant  Accu-Chek Softclix Lancets lancets [id number] Yes Use in the morning, at noon, and at bedtime. Leroy Sea, MD Taking Active   allopurinol (ZYLOPRIM) 300 MG tablet [id number] Yes TAKE 1 TABLET BY MOUTH DAILY  Patient taking differently: Take 300 mg by mouth at bedtime.   [name], MD Taking Active Self, Pharmacy Records  apixaban [facility]) 5 MG TABS tablet [id number] Yes Take 1 tablet (5 mg total) by mouth 2 (two) times daily. Leroy Sea, MD Taking Active   Blood  Glucose Monitoring Suppl (BLOOD GLUCOSE MONITOR SYSTEM) w/Device KIT [id number] Yes 1 each by Does not apply route in the morning, at noon, and at bedtime. Leroy Sea, MD Taking Active   doxazosin (CARDURA) 2 MG tablet [id number] Yes TAKE 1 TABLET BY MOUTH DAILY  Patient taking differently: Take 2 mg by mouth at bedtime.   [name], MD Taking Active Self, Pharmacy Records  empagliflozin (JARDIANCE) 10 MG TABS tablet [id number] Yes Take 1 tablet (10 mg total) by mouth daily. Arty Baumgartner, NP Taking Active   Glucose Blood (BLOOD GLUCOSE TEST STRIPS) STRP [id number] Yes Use in the morning, at noon, and at bedtime. May substitute to any manufacturer covered by patient's insurance. Leroy Sea, MD Taking Active   insulin aspart (FIASP FLEXTOUCH) 100 UNIT/ML FlexTouch Pen [id number] Yes Before each meal 3 times a day, 140-199 - 2 units, 200-250 - 4 units, 251-299 - 6 units,  300-349 - 8 units,  350 or above 10 units. Leroy Sea, MD Taking Active   Insulin Pen Needle 32G X 4 MM MISC [id number] Yes For 4 times a day insulin injection under the skin, 1 month supply. Leroy Sea, MD Taking Active   Lancet Device MISC [id number] Yes 1 each by Does not apply route in the morning, at noon, and at bedtime. May substitute to any manufacturer covered by patient's insurance. Leroy Sea, MD Taking Active   losartan (COZAAR) 25 MG tablet [id number] Yes Take 1 tablet (25 mg total) by mouth daily. Arty Baumgartner, NP Taking Active   metoprolol tartrate (LOPRESSOR) 50 MG tablet [id number] Yes Take 1.5 tablets (75 mg total) by mouth 2 (two) times daily. Leroy Sea, MD Taking Active   mirtazapine (  REMERON) 30 MG tablet [id number] Yes TAKE 1 TABLET BY MOUTH EVERY NIGHT AT BEDTIME FOR APPETITE [name], MD Taking Active Self, Pharmacy Records  pantoprazole (PROTONIX) 40 MG tablet [id number] Yes Take 1 tablet (40 mg total) by mouth daily. [name], MD Taking Active  Self, Pharmacy Records  predniSONE (DELTASONE) 10 MG tablet [id number] Yes Take 6 tablets (60 mg total) by mouth daily. You must follow-up with your rheumatologist before stopping or changing the dose of this medication, follow-up with your rheumatologist within the next 7 to 10 days. Leroy Sea, MD Taking Active   simvastatin (ZOCOR) 40 MG tablet [id number] Yes TAKE ONE TABLET BY MOUTH DAILY  Patient taking differently: Take 40 mg by mouth at bedtime.   [name], MD Taking Active Self, Pharmacy Records            Home Care and Equipment/Supplies: Were Home Health Services Ordered?: NA Any new equipment or medical supplies ordered?: NA  Functional Questionnaire: Do you need assistance with bathing/showering or dressing?: No Do you need assistance with meal preparation?: No Do you need assistance with eating?: No Do you have difficulty maintaining continence: No Do you need assistance with getting out of bed/getting out of a chair/moving?: No Do you have difficulty managing or taking your medications?: No  Follow up appointments reviewed: PCP Follow-up appointment confirmed?: Yes Date of PCP follow-up appointment?: 08/20/22 Follow-up Provider: Dr. Tanya Nones Specialist [facility] Follow-up appointment confirmed?: Yes Date of Specialist follow-up appointment?: 08/23/22 Follow-Up Specialty Provider:: Dr. Lisabeth Devoid, pt will call and make appt with neuro, he has been referred to [facility]pt therapy [facility] to follow up if he doesn't hear from them Do you need transportation to your follow-up appointment?: No Do you understand care options if your condition(s) worsen?: Yes-patient verbalized understanding  SDOH Interventions Today    Flowsheet Row Most Recent Value  SDOH Interventions   Food Insecurity Interventions Intervention Not Indicated  Transportation Interventions Intervention Not Indicated      TOC Interventions Today    Flowsheet Row Most Recent Value  TOC  Interventions   TOC Interventions Discussed/Reviewed TOC Interventions Discussed, Arranged PCP follow up within 7 days/Care Guide scheduled      Interventions Today    Flowsheet Row Most Recent Value  Chronic Disease   Chronic disease during today's visit Hypertension (HTN), Atrial Fibrillation (AFib), Diabetes  General Interventions   General Interventions Discussed/Reviewed General Interventions Discussed, Durable Medical Equipment (DME), Doctor Visits, Referral to Nurse  [discussed RN follow up call for ongoing support & education of chronic conditions-pt agreeable and appt scheduled with assigned RN care coordinator]  Doctor Visits Discussed/Reviewed Doctor Visits Discussed, PCP, Specialist  Durable Medical Equipment (DME) Glucomoter  [pt states son has been helping him with checking blood sugars-cbg today was 142]  PCP/Specialist Visits Compliance with follow-up visit  Education Interventions   Education Provided Provided Education  Provided Verbal Education On Nutrition, Blood Sugar Monitoring, Medication, When to see the doctor, Other  [sx mgmt-bowel mgmt-pt has not had a BM in a few days-he will get something OTC today to take]  Nutrition Interventions   Nutrition Discussed/Reviewed Nutrition Discussed, Decreasing sugar intake, Decreasing fats, Decreasing salt, Adding fruits and vegetables, Fluid intake  Pharmacy Interventions   Pharmacy Dicussed/Reviewed Pharmacy Topics Discussed, Medications and their functions  Safety Interventions   Safety Discussed/Reviewed Safety Discussed       Alessandra Grout [facility] Care Management Care Management Community Coordinator Direct Phone: [phone] Toll Free: [phone] Fax:  [phone]  

## 2022-08-19 ENCOUNTER — Ambulatory Visit
Admission: RE | Admit: 2022-08-19 | Discharge: 2022-08-19 | Disposition: A | Discharge status: Home / Self Care | Payer: Medicare Other | Source: Ambulatory Visit | Attending: Family Medicine | Admitting: Family Medicine

## 2022-08-19 DIAGNOSIS — R634 Abnormal weight loss: Secondary | ICD-10-CM

## 2022-08-19 DIAGNOSIS — H468 Other optic neuritis: Secondary | ICD-10-CM | POA: Diagnosis not present

## 2022-08-19 DIAGNOSIS — R1084 Generalized abdominal pain: Secondary | ICD-10-CM | POA: Diagnosis not present

## 2022-08-19 DIAGNOSIS — R6881 Early satiety: Secondary | ICD-10-CM | POA: Diagnosis not present

## 2022-08-19 DIAGNOSIS — M316 Other giant cell arteritis: Secondary | ICD-10-CM | POA: Diagnosis not present

## 2022-08-19 MED ORDER — IOPAMIDOL (ISOVUE-300) INJECTION 61%
100.0000 mL | Freq: Once | INTRAVENOUS | Status: AC | PRN
  Administered 2022-08-19: 100 mL via INTRAVENOUS

## 2022-08-20 ENCOUNTER — Other Ambulatory Visit: Payer: Self-pay | Admitting: Family Medicine

## 2022-08-20 ENCOUNTER — Ambulatory Visit (INDEPENDENT_AMBULATORY_CARE_PROVIDER_SITE_OTHER): Payer: Medicare Other | Admitting: Family Medicine

## 2022-08-20 ENCOUNTER — Encounter: Payer: Self-pay | Admitting: Family Medicine

## 2022-08-20 VITALS — BP 132/76 | HR 67 | Temp 97.5°F | Ht 68.0 in | Wt 147.4 lb

## 2022-08-20 DIAGNOSIS — E1169 Type 2 diabetes mellitus with other specified complication: Secondary | ICD-10-CM

## 2022-08-20 DIAGNOSIS — M353 Polymyalgia rheumatica: Secondary | ICD-10-CM | POA: Diagnosis not present

## 2022-08-20 DIAGNOSIS — M316 Other giant cell arteritis: Secondary | ICD-10-CM

## 2022-08-20 DIAGNOSIS — I480 Paroxysmal atrial fibrillation: Secondary | ICD-10-CM

## 2022-08-20 DIAGNOSIS — Z7984 Long term (current) use of oral hypoglycemic drugs: Secondary | ICD-10-CM | POA: Diagnosis not present

## 2022-08-20 DIAGNOSIS — I671 Cerebral aneurysm, nonruptured: Secondary | ICD-10-CM

## 2022-08-20 MED ORDER — GLIPIZIDE ER 2.5 MG PO TB24: 2.5000 mg | ORAL_TABLET | Freq: Every day | ORAL | 1 refills | Status: DC

## 2022-08-20 MED ORDER — PREDNISONE 20 MG PO TABS: 60.0000 mg | ORAL_TABLET | Freq: Every day | ORAL | 1 refills | Status: DC

## 2022-08-20 NOTE — Progress Notes (Signed)
Subjective:    Patient ID: Donald Rios, male    DOB: February 23, 1940, 82 y.o.   MRN: [id number]  Admit date: 08/13/2022  Discharge date: 08/16/2022   Admitted From: Home   Disposition:  Home     Recommendations for Outpatient Follow-up:    Follow up with PCP in 1-2 weeks   PCP Please obtain BMP/CBC, 2 view CXR in 1week,  (see Discharge instructions)    PCP Please follow up on the following pending results:      Home Health: Non   Equipment/Devices: None     Consultations: Neuro, cards Discharge Condition: Stable    CODE STATUS: Full    Diet Recommendation: Heart Healthy Low Carb   Diet Order       None              Chief Complaint  Patient presents with  . Temporal Arteritis      Brief history of present illness from the day of admission and additional interim summary     82 y.o. 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  Neurology was consulted and I was requested to admit the patient                                                                   Hospital Course    Gradually progressive but near total left eye vision present on admission, mildly blurry right eye vision developed after hospitalization despite being on high-dose steroids.  With elevated ESR and  CRP, seen by ophthalmologist found to have disc edema, with bilateral jaw claudication, highly suspicious for giant cell arteritis placed on IV Solu-Medrol, neurology on board, stroke ruled out.  Remote strokes noted and cleared by neuro, carotid vascular ultrasound with classic bilateral halo sign noted, continue high-dose steroids along with Eliquis for A-fib.  Case was discussed with neurology again on 08/15/2022, currently symptoms have stabilized, will be discharged on prednisone 60 mg daily with outpatient follow-up with PCP within a week request PCP to arrange for follow-up with his rheumatologist within 7 to 10 days.  He tells me he has been seen by dermatology multiple times in the past due to past history of polymyalgia rheumatica.  Further titration of prednisone will be deferred to neurology outpatient and is rheumatologist.   2.  CAD s/p CABG.  No acute issues no chest pain, continue home dose aspirin, statin and beta-blocker for secondary prevention.  Cardiology to see.   3.  New diagnosis of atrial  fibrillation.  [place] vas 2 score of greater than 3.  Stable TSH, echo noted as below, he was initially on heparin drip and now on Eliquis after clearance with neurology.  Beta-blocker dose increased for better rate control, underwent TEE cardioversion on 08/16/2022 which was successful, EF was 30%.  He will continue to follow with PCP and cardiology postdischarge.   4.  Previous history of ischemic EF 45% with global hypokinesis on echocardiogram, TEE shows a EF of 30%. Cards on board, clinically compensated, on beta-blocker and ARB + Jardiance per cards, sometimes 3 will be discharged with close outpatient follow-up with PCP and cardiology.   5.  Dyslipidemia.  Continue him on statin, check lipid panel in the morning along with A1c.   6.  GERD with history of GI bleed.  PPI.   7.  Essential hypertension.  Blocker dose increased for better rate control, ACE >> ARB, blood pressure stable.     8. DM  type II. A1c, sliding scale due to high-dose steroids.  Insulin education and diabetic testing education provided prior to discharge.     Discharge diagnosis       Principal Problem:   Temporal arteritis (HCC) Active Problems:   DM2 (diabetes mellitus, type 2) (HCC)   GERD (gastroesophageal reflux disease)   Mixed hyperlipidemia   Essential hypertension   Hx of CABG   New onset atrial fibrillation (HCC)   Cardiomyopathy (HCC)  Last week, the patient developed the sudden onset of blurry vision in his left eye.  He went to the ophthalmologist office and the ophthalmologist noticed inflammation of the optic disc.  He was concerned about temporal arteritis.  Due to his elevated sedimentation rate, the patient was admitted to the hospital and started on high-dose steroids.  He is currently taking 60 mg of prednisone daily.  He states that he has completely lost vision in his left eye.  Everything appears gray with occasional flashes of light.  He still has normal vision in his right eye.  He denies any headache.  He denies any jaw pain or claudication today although he states that his tongue does feel tired when he tries to eat.  He denies any tenderness over his temples.  He denies any pain in his shoulders or in his hips.  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  These are findings.  He has not seen neurology to discuss either of these 2 issues.  He is requesting that I schedule him a follow-up with neurology.  He also previously saw a rheumatology for PMR however we were successfully able to wean him off prednisone.  He would like to establish again with his rheumatologist.  Since starting the high-dose steroids, his blood sugars have been over 300.  He is currently only taking Januvia.  He denies any polyuria or polydipsia or abdominal pain today. Past Medical History:  Diagnosis Date  . Acute gastric ulcer with hemorrhage 05/06/2013  . Arthritis   . Asthma    . Atrial enlargement, left   . 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 Cataract   . COPD (chronic obstructive pulmonary disease) (HCC)   . Diabetes mellitus   . Diabetic nephropathy (HCC)   . Diverticulosis   . ED (erectile dysfunction)   . GERD (gastroesophageal reflux disease)   . Gout   . Hearing loss   . Hiatal hernia 05/2008   EGD with HH and reflux  esophagitis.   Marland Kitchen History of MI (myocardial infarction)   . History of nuclear stress test 08/07/2010   dipyridamole; EKG negative for ischemia, low risk scan   . Hyperlipidemia   . Hyperplastic colon polyp 05/2008  . Hypertension   . Ischemic cardiomyopathy    EF 45%, with inferior wall motion abnormality   . Myocardial infarction (HCC)   . Valvular regurgitation    mitral and tricuspid (mild)   Past Surgical History:  Procedure Laterality Date  . CARDIOVERSION N/A 08/16/2022   Procedure: CARDIOVERSION;  Surgeon: Jake Bathe, MD;  Location: [facility] INVASIVE CV LAB;  Service: Cardiovascular;  Laterality: N/A;  . CORONARY ARTERY BYPASS GRAFT  2000   LIMA to LAD, free IMA to OM2, sequential graft to PLA & PLD  . ESOPHAGOGASTRODUODENOSCOPY N/A 05/07/2013   Procedure: ESOPHAGOGASTRODUODENOSCOPY (EGD);  Surgeon: Iva Boop, MD;  Location: [facility] ENDOSCOPY;  Service: Endoscopy;  Laterality: N/A;  . TEE WITHOUT CARDIOVERSION N/A 08/16/2022   Procedure: TRANSESOPHAGEAL ECHOCARDIOGRAM;  Surgeon: Jake Bathe, MD;  Location: [facility] INVASIVE CV LAB;  Service: Cardiovascular;  Laterality: N/A;  . TRANSTHORACIC ECHOCARDIOGRAM  09/07/2010   EF 45-50%; LV systolic function mildly reduced; LA mildly dilated; mild-mod MR & mild-mod TR; aortic root sclerosis/calcification;    Current Outpatient Medications on File Prior to Visit  Medication Sig Dispense Refill  . Accu-Chek Softclix Lancets lancets Use in the morning, at noon, and at bedtime. 100 each 0  . allopurinol (ZYLOPRIM) 300 MG tablet TAKE 1 TABLET BY MOUTH DAILY (Patient taking  differently: Take 300 mg by mouth at bedtime.) 90 tablet 1  . apixaban (ELIQUIS) 5 MG TABS tablet Take 1 tablet (5 mg total) by mouth 2 (two) times daily. 60 tablet 0  . Blood Glucose Monitoring Suppl (BLOOD GLUCOSE MONITOR SYSTEM) w/Device KIT 1 each by Does not apply route in the morning, at noon, and at bedtime. 1 kit 0  . doxazosin (CARDURA) 2 MG tablet TAKE 1 TABLET BY MOUTH DAILY (Patient taking differently: Take 2 mg by mouth at bedtime.) 90 tablet 3  . empagliflozin (JARDIANCE) 10 MG TABS tablet Take 1 tablet (10 mg total) by mouth daily. 30 tablet 2  . Glucose Blood (BLOOD GLUCOSE TEST STRIPS) STRP Use in the morning, at noon, and at bedtime. May substitute to any manufacturer covered by patient's insurance. 100 strip 0  . insulin aspart (FIASP FLEXTOUCH) 100 UNIT/ML FlexTouch Pen Before each meal 3 times a day, 140-199 - 2 units, 200-250 - 4 units, 251-299 - 6 units,  300-349 - 8 units,  350 or above 10 units. 9 mL 0  . Insulin Pen Needle 32G X 4 MM MISC For 4 times a day insulin injection under the skin, 1 month supply. 100 each 0  . Lancet Device MISC 1 each by Does not apply route in the morning, at noon, and at bedtime. May substitute to any manufacturer covered by patient's insurance. 1 each 0  . losartan (COZAAR) 25 MG tablet Take 1 tablet (25 mg total) by mouth daily. 90 tablet 1  . metoprolol tartrate (LOPRESSOR) 50 MG tablet Take 1.5 tablets (75 mg total) by mouth 2 (two) times daily. 90 tablet 0  . mirtazapine (REMERON) 30 MG tablet TAKE 1 TABLET BY MOUTH EVERY NIGHT AT BEDTIME FOR APPETITE 30 tablet 2  . pantoprazole (PROTONIX) 40 MG tablet Take 1 tablet (40 mg total) by mouth daily. 30 tablet 3  . simvastatin (ZOCOR) 40 MG tablet TAKE ONE TABLET BY  MOUTH DAILY (Patient taking differently: Take 40 mg by mouth at bedtime.) 90 tablet 3   No current facility-administered medications on file prior to visit.     No Known Allergies Social History   Socioeconomic History  .  Marital status: Married    Spouse name: Not on file  . Number of children: 1  . Years of education: Not on file  . Highest education level: Not on file  Occupational History  . Not on file  Tobacco Use  . Smoking status: Former    Current packs/day: 0.00    Types: Cigarettes    Quit date: 11/04/1985    Years since quitting: 36.8  . Smokeless tobacco: Never  Substance and Sexual Activity  . Alcohol use: Yes    Alcohol/week: 2.0 - 3.0 standard drinks of alcohol    Types: 2 - 3 Cans of beer per week  . Drug use: No  . Sexual activity: Not on file  Other Topics Concern  . Not on file  Social History Narrative  . Not on file   Social Determinants of Health   Financial Resource Strain: Low Risk  (07/22/2022)   Overall Financial Resource Strain (CARDIA)   . Difficulty of Paying Living Expenses: Not hard at all  Food Insecurity: No Food Insecurity (08/17/2022)   Hunger Vital Sign   . Worried About Programme researcher, broadcasting/film/video in the Last Year: Never true   . Ran Out of Food in the Last Year: Never true  Transportation Needs: No Transportation Needs (08/17/2022)   PRAPARE - Transportation   . Lack of Transportation (Medical): No   . Lack of Transportation (Non-Medical): No  Physical Activity: Sufficiently Active (07/10/2021)   Exercise Vital Sign   . Days of Exercise per Week: 5 days   . Minutes of Exercise per Session: 30 min  Stress: No Stress Concern Present (07/22/2022)   [employer] of Occupational Health - Occupational Stress Questionnaire   . Feeling of Stress : Not at all  Social Connections: Socially Integrated (07/22/2022)   Social Connection and Isolation Panel [NHANES]   . Frequency of Communication with Friends and Family: More than three times a week   . Frequency of Social Gatherings with Friends and Family: More than three times a week   . Attends Religious Services: More than 4 times per year   . Active Member of Clubs or Organizations: Yes   . Attends Tax inspector Meetings: More than 4 times per year   . Marital Status: Married  Catering manager Violence: Not At Risk (08/13/2022)   Humiliation, Afraid, Rape, and Kick questionnaire   . Fear of Current or Ex-Partner: No   . Emotionally Abused: No   . Physically Abused: No   . Sexually Abused: No     Review of Systems  All other systems reviewed and are negative.      Objective:   Physical Exam Vitals reviewed.  Constitutional:      Appearance: He is well-developed.  Neck:     Thyroid: No thyromegaly.     Vascular: No JVD.  Cardiovascular:     Rate and Rhythm: Normal rate. Rhythm irregular.     Heart sounds: Normal heart sounds. No murmur heard. Pulmonary:     Effort: Pulmonary effort is normal. No respiratory distress.     Breath sounds: Normal breath sounds. No wheezing or rales.  Chest:     Chest wall: No tenderness.  Abdominal:     General: Bowel  sounds are normal. There is no distension.     Palpations: Abdomen is soft. There is no mass.     Tenderness: There is no abdominal tenderness. There is no guarding or rebound.  Musculoskeletal:     Cervical back: Neck supple.  Lymphadenopathy:     Cervical: No cervical adenopathy.  Skin:    Findings: No rash.          Assessment & Plan:  Temporal arteritis (HCC) - Plan: Ambulatory referral to Neurology, Ambulatory referral to Rheumatology  Type 2 diabetes mellitus with other specified complication, without long-term current use of insulin (HCC)  Cerebral aneurysm - Plan: Ambulatory referral to Neurology  PMR (polymyalgia rheumatica) (HCC) - Plan: Ambulatory referral to Neurology, Ambulatory referral to Rheumatology  Paroxysmal atrial fibrillation [facility]) Patient needs to see rheumatology and neurology as soon as possible.  Will plan on continuing prednisone 60 mg a day for at least the next 2 weeks.  I will see the patient back in 2 weeks and plan to taper him down to 50 mg if neurology and rheumatology are okay  with that.  He is asymptomatic today other than the damage in the back of his retina which is causing permanent vision loss in his left eye.  His sugars however are significantly elevated and over 300.  Given that he will be on prednisone for several months, I will add glipizide extended release 2.5 mg daily and recheck the patient in 2 weeks.  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  I believe right now the only need is to monitor these.  However I would appreciate neurology's input.  Patient is currently in atrial fibrillation today.  However he is appropriately rate controlled with metoprolol and appropriately anticoagulated with Eliquis so no further treatment is necessary for this.  Recheck in 2 weeks.

## 2022-08-22 ENCOUNTER — Other Ambulatory Visit: Payer: Self-pay | Admitting: Family Medicine

## 2022-08-23 ENCOUNTER — Encounter: Payer: Self-pay | Admitting: Physician Assistant

## 2022-08-23 ENCOUNTER — Ambulatory Visit: Payer: Medicare Other | Attending: Physician Assistant | Admitting: Physician Assistant

## 2022-08-23 VITALS — BP 120/52 | HR 61 | Ht 68.0 in | Wt 150.4 lb

## 2022-08-23 DIAGNOSIS — I25812 Atherosclerosis of bypass graft of coronary artery of transplanted heart without angina pectoris: Secondary | ICD-10-CM | POA: Diagnosis not present

## 2022-08-23 DIAGNOSIS — I10 Essential (primary) hypertension: Secondary | ICD-10-CM | POA: Diagnosis not present

## 2022-08-23 DIAGNOSIS — Z79899 Other long term (current) drug therapy: Secondary | ICD-10-CM | POA: Diagnosis not present

## 2022-08-23 DIAGNOSIS — I4819 Other persistent atrial fibrillation: Secondary | ICD-10-CM | POA: Diagnosis not present

## 2022-08-23 DIAGNOSIS — I5022 Chronic systolic (congestive) heart failure: Secondary | ICD-10-CM

## 2022-08-23 DIAGNOSIS — E782 Mixed hyperlipidemia: Secondary | ICD-10-CM

## 2022-08-23 MED ORDER — SPIRONOLACTONE 25 MG PO TABS
12.5000 mg | ORAL_TABLET | Freq: Every day | ORAL | 3 refills | Status: DC
Start: 2022-08-23 — End: 2022-11-23

## 2022-08-23 NOTE — Patient Instructions (Addendum)
Medication Instructions:  START SPIRONOLACTONE 12.5MG  DAILY (CUT 25MG  TABLET IN 1/2)  *If you need a refill on your cardiac medications before your next appointment, please call your pharmacy*   Lab Work: CBC AND BMP IN 1 WEEK.  If you have labs (blood work) drawn today and your tests are completely normal, you will receive your results only by: MyChart Message (if you have MyChart) OR A paper copy in the mail If you have any lab test that is abnormal or we need to change your treatment, we will call you to review the results.   Testing/Procedures:[address] Your physician has requested that you have an echocardiogram. Echocardiography is a painless test that uses sound waves to create images of your heart. It provides your doctor with information about the size and shape of your heart and how well your heart's chambers and valves are working. This procedure takes approximately one hour. There are no restrictions for this procedure. Please do NOT wear cologne, perfume, aftershave, or lotions (deodorant is allowed). Please arrive 15 minutes prior to your appointment time.  DUE: November 2024   Follow-Up: At [facility], you and your health needs are our priority.  As part of our continuing mission to provide you with exceptional heart care, we have created designated Provider Care Teams.  These Care Teams include your primary Cardiologist (physician) and Advanced Practice Providers (APPs -  Physician Assistants and Nurse Practitioners) who all work together to provide you with the care you need, when you need it.   Your next appointment:   12/20/22 AT 11:30AM  Provider:   Chrystie Nose, MD     Other Instructions ECHO NEEDS TO BE COMPLETED BEFORE APPOINTMENT WITH DR.HILTY.

## 2022-08-23 NOTE — Progress Notes (Unsigned)
Cardiology Office Note:  .   Date:  08/24/2022  ID:  Donald, Rios 1940-11-30, MRN [id number] PCP: [name], MD  [facility] Providers Cardiologist:  Chrystie Nose, MD     History of Present Illness: .   Donald Rios is a 82 y.o. male with past medical history of CAD s/p CABG (LIMA-LAD, free IMA-OM2, sequential vein graft-PLA-PLD 2000, ICM with normalization of EF, HLD, DM II, h/o GIB and recently diagnosed afib.  Patient had mildly in 2020 showed no evidence of ischemia, fixed inferior defect, EF 49%.  Subsequent echocardiogram in 2021 showed normalization of EF to 60 to 65% without regional wall motion abnormality, grade 2 DD, normal RV function, mildly elevated PASP.  Patient was recently admitted to the hospital due to acute onset of blurry vision thought to be secondary to temporal arteritis.  He recently underwent cataract surgery and had floaters and blurriness afterward.  Due to significant hazy vision, he followed up with his ophthalmologist who was concerned and referred him to his PCPs office for additional blood work.  Due to elevated ESR and CRP, he was sent to ED for further evaluation.  In the ER, he was found to have new onset of atrial fibrillation with heart rate of 90-100.  Cardiology service was consulted.  Ultrasound of the temporal artery obtained on 09/10/2022 showed presence of halo sign in bilateral temporal artery suggestive of temporal arteritis.  This was reviewed by vascular surgery.  No biopsy was felt to be needed.  Patient was started on Eliquis 5 mg twice a day.  Repeat echocardiogram obtained on 09/10/2022 showed EF borderline low at 45 to 50%, global hypokinesis, elevated LVEDP, RVSP 60 to 22 mmHg, severe LAE, mild to moderate MR.  Patient ultimately underwent successful TEE DCCV by Dr. [name] on 08/16/2022, TEE showed EF 30%, moderate to severe MR with A2 prolapse, mild to moderate TR, very small PFO.  He underwent synchronized biphasic  defibrillation with 200 J and converted back to sinus rhythm.  Due to low EF, ARB was added with plan for outpatient limited echocardiogram at a later time to reassess ejection fraction.  Patient presents today for follow-up.  EKG shows he has went back into atrial fibrillation.  He denies any obvious chest pain, shortness of breath, fatigue or palpitation.  He is asymptomatic with A-fib.  We discussed various options including leave him in A-fib, referred to A-fib clinic to consider antiarrhythmic therapy and repeat cardioversion, or referred to EP service for consideration of A-fib ablation.  He wished to think about his options and to stay in A-fib for the time being.  He is aware that the longer he stay in A-fib, the harder will be for him to come out of A-fib.  ROS:   He denies chest pain, palpitations, dyspnea, pnd, orthopnea, n, v, dizziness, syncope, edema, weight gain, or early satiety. All other systems reviewed and are otherwise negative except as noted above.    Studies Reviewed: Marland Kitchen   EKG Interpretation Date/Time:  Monday August 23 2022 11:22:28 EDT Ventricular Rate:  67 PR Interval:    QRS Duration:  140 QT Interval:  396 QTC Calculation: 418 R Axis:   -7  Text Interpretation: Atrial fibrillation Left bundle branch block When compared with ECG of 16-Aug-2022 11:39, Atrial fibrillation has replaced Sinus rhythm Confirmed by Azalee Course [id number]) on 08/23/2022 11:46:22 AM     Risk Assessment/Calculations:    CHA2DS2-VASc Score = 6  This indicates a 9.7% annual risk of stroke. The patient's score is based upon: CHF History: 1 HTN History: 1 Diabetes History: 1 Stroke History: 0 Vascular Disease History: 1 Age Score: 2 Gender Score: 0            Physical Exam:   VS:  BP (!) 120/52 (BP Location: Left Arm, Patient Position: Sitting, Cuff Size: Normal)   Pulse 61   Ht 5\' 8"  (1.727 m)   Wt 150 lb 6.4 oz (68.2 kg)   SpO2 98%   BMI 22.87 kg/m    Wt Readings from Last 3  Encounters:  08/23/22 150 lb 6.4 oz (68.2 kg)  08/20/22 147 lb 6.4 oz (66.9 kg)  08/13/22 144 lb 14.4 oz (65.7 kg)    GEN: Well nourished, well developed in no acute distress NECK: No JVD; No carotid bruits CARDIAC: Irregularly irregular, no murmurs, rubs, gallops RESPIRATORY:  Clear to auscultation without rales, wheezing or rhonchi  ABDOMEN: Soft, non-tender, non-distended EXTREMITIES:  No edema; No deformity   ASSESSMENT AND PLAN: .    Persistent atrial fibrillation: He was diagnosed with atrial fibrillation during the recent hospitalization.  Echocardiogram showed EF 45 to 50% with global hypokinesis.  He underwent successful TEE DCCV by Dr. Anne Fu on 08/16/2022, EF was 30% on TEE.  Based on today's EKG, he has went back into atrial fibrillation.  We discussed various options including continue with rate control therapy versus refer him to A-fib clinic to consider antiarrhythmic therapy followed by repeat cardioversion versus A-fib ablation.  He wished to think about his options and the stay in A-fib for the time being as he is completely asymptomatic.  He is aware that the longer he stays in A-fib, the harder it will be for him to come out of A-fib.  CAD: Denies any recent chest pain.  Chronic systolic heart failure: Recently diagnosed with A-fib, TEE at that time showed EF 30%.  Started on spironolactone 12.5 mg daily.  He appears to be euvolemic on exam.  Obtain CBC and a basic metabolic panel in 1 week.  Repeat echocardiogram in November.  Hypertension: Blood pressure stable  Hyperlipidemia: On simvastatin       Dispo: Follow-up with Dr. Rennis Golden after his repeat echocardiogram.  Signed, Azalee Course, PA

## 2022-08-24 ENCOUNTER — Ambulatory Visit: Payer: Self-pay | Admitting: *Deleted

## 2022-08-24 NOTE — Patient Outreach (Signed)
  Care Coordination   Initial Visit Note   08/24/2022 Name: Donald Rios MRN: [id number] DOB: 1940/07/31  Donald Rios is a 82 y.o. year old male who sees Pickard, Priscille Heidelberg, MD for primary care. I spoke with  Donald Rios by phone today.  What matters to the patients health and wellness today?  Difficulty chewing related to pain in jaw, not dentition Eating soft foods and will consider chopping foods with blender    Diabetes- cbg 186 this morning On prednisone Aware prednisone increases his cbg values    Vision floaters  Want to drive - "not confident enough to attempt to drive long distances at this time" Voiced understanding of education of floaters and some home care suggestions   congestive Heart Failure (CHF) weighs after shower  Still has some edema Denies chest pain or palpitations     Goals Addressed             This Visit's Progress    [facility] nurse care coordination services (Afib, DM, floaters)       Interventions Today    Flowsheet Row Most Recent Value  Chronic Disease   Chronic disease during today's visit Diabetes, Congestive Heart Failure (CHF), Hypertension (HTN), Atrial Fibrillation (AFib), Other  General Interventions   General Interventions Discussed/Reviewed General Interventions Discussed, Doctor Visits, Community Resources, Horticulturist, commercial (DME)  Doctor Visits Discussed/Reviewed Doctor Visits Discussed, PCP, Specialist  [pcp & cardiology]  Durable Medical Equipment (DME) BP Cuff, Glucomoter  [cbg this morning was 186, on prednisone and has been as high a 280s]  PCP/Specialist Visits Compliance with follow-up visit  Exercise Interventions   Exercise Discussed/Reviewed Exercise Discussed, Physical Activity, Assistive device use and maintanence  [encouraged to monitor blood pressure and pulse, report low blood pressure (<100/60) and high pulses (+100)]  Physical Activity Discussed/Reviewed Physical Activity Discussed  [walking in the home,  concerned with floaters and not driving at the moment.]  Education Interventions   Education Provided Provided Web-based Education, Provided Education  [web base education on atrial fibrillation/acton plan, CHF/action plan, Sodium, fluids, potassium.  Risk of falls with low blood pressures. Eating in moderation, monitoring for foods that cause increase cbgs, [facility] services (RN, SW, Pharmacy)]  Provided Verbal Education On Nutrition, Eye Care, Labs, Blood Sugar Monitoring, Medication, Community Resources  Labs Reviewed Hgb A1c  Mental Health Interventions   Mental Health Discussed/Reviewed Mental Health Discussed, Coping Strategies  Nutrition Interventions   Nutrition Discussed/Reviewed Nutrition Discussed, Fluid intake, Decreasing sugar intake, Portion sizes, Decreasing salt  Pharmacy Interventions   Pharmacy Dicussed/Reviewed Pharmacy Topics Discussed, Medications and their functions, Affording Medications  Safety Interventions   Safety Discussed/Reviewed Safety Discussed, Fall Risk              SDOH assessments and interventions completed:  Yes  SDOH Interventions Today    Flowsheet Row Most Recent Value  SDOH Interventions   Food Insecurity Interventions Intervention Not Indicated  Transportation Interventions Intervention Not Indicated  Utilities Interventions Intervention Not Indicated  Financial Strain Interventions Intervention Not Indicated  Stress Interventions Intervention Not Indicated  Health Literacy Interventions Intervention Not Indicated        Care Coordination Interventions:  Yes, provided   Follow up plan: Follow up call scheduled for 09/23/22    Encounter Outcome:  Pt. Visit Completed    L. Noelle Penner, RN, BSN, CCM [facility] Care Management Community Coordinator Office number [phone]

## 2022-08-24 NOTE — Telephone Encounter (Signed)
Requested medications are due for refill today.  yes  Requested medications are on the active medications list.  yes  Last refill. 08/16/2022 #90 0 rf (30 day supply)  Future visit scheduled.   Yes   Notes to clinic.  Rx signed by Susa Raring.    Requested Prescriptions  Pending Prescriptions Disp Refills   metoprolol tartrate (LOPRESSOR) 50 MG tablet [Pharmacy Med Name: METOPROLOL TARTRATE 50 MG TAB] 270 tablet     Sig: TAKE 1 AND 1/2 TABLETS BY MOUTH TWO TIMES A DAY     Cardiovascular:  Beta Blockers Failed - 08/22/2022  6:41 AM      Failed - Valid encounter within last 6 months    Recent Outpatient Visits           1 year ago Right carotid bruit   [facility] Pickard, Priscille Heidelberg, MD   1 year ago Dehydration   [facility] Pickard, Priscille Heidelberg, MD   1 year ago Dysuria   [facility] Medicine [name], MD   1 year ago Type 2 diabetes mellitus without complication, without long-term current use of insulin (HCC)   [facility] Medicine [name], MD   2 years ago Benign essential HTN   [facility] Pickard, Priscille Heidelberg, MD       Future Appointments             In 2 months Pickard, Priscille Heidelberg, MD [facility], [facility]   In 3 months [place], Lisette Abu, MD [facility] - Last BP in normal range    BP Readings from Last 1 Encounters:  08/23/22 (!) 120/52         Passed - Last Heart Rate in normal range    Pulse Readings from Last 1 Encounters:  08/23/22 61

## 2022-08-24 NOTE — Patient Instructions (Addendum)
Visit Information  Thank you for taking time to visit with me today. Please don't hesitate to contact me if I can be of assistance to you.   Following are the goals we discussed today:   Goals Addressed             This Visit's Progress    [facility] nurse care coordination services (Afib, DM, floaters)       Interventions Today    Flowsheet Row Most Recent Value  Chronic Disease   Chronic disease during today's visit Diabetes, Congestive Heart Failure (CHF), Hypertension (HTN), Atrial Fibrillation (AFib), Other  General Interventions   General Interventions Discussed/Reviewed General Interventions Discussed, Doctor Visits, Community Resources, Horticulturist, commercial (DME)  Doctor Visits Discussed/Reviewed Doctor Visits Discussed, PCP, Specialist  [pcp & cardiology]  Durable Medical Equipment (DME) BP Cuff, Glucomoter  [cbg this morning was 186, on prednisone and has been as high a 280s]  PCP/Specialist Visits Compliance with follow-up visit  Exercise Interventions   Exercise Discussed/Reviewed Exercise Discussed, Physical Activity, Assistive device use and maintanence  [encouraged to monitor blood pressure and pulse, report low blood pressure (<100/60) and high pulses (+100)]  Physical Activity Discussed/Reviewed Physical Activity Discussed  [walking in the home, concerned with floaters and not driving at the moment.]  Education Interventions   Education Provided Provided Web-based Education, Provided Education  [web base education on atrial fibrillation/acton plan, CHF/action plan, Sodium, fluids, potassium.  Risk of falls with low blood pressures. Eating in moderation, monitoring for foods that cause increase cbgs, [facility] services (RN, SW, Pharmacy)]  Provided Verbal Education On Nutrition, Eye Care, Labs, Blood Sugar Monitoring, Medication, Community Resources  Labs Reviewed Hgb A1c  Mental Health Interventions   Mental Health Discussed/Reviewed Mental Health Discussed, Coping Strategies   Nutrition Interventions   Nutrition Discussed/Reviewed Nutrition Discussed, Fluid intake, Decreasing sugar intake, Portion sizes, Decreasing salt  Pharmacy Interventions   Pharmacy Dicussed/Reviewed Pharmacy Topics Discussed, Medications and their functions, Affording Medications  Safety Interventions   Safety Discussed/Reviewed Safety Discussed, Fall Risk              Our next appointment is by telephone on 09/23/22 at 1100  Please call the care guide team at [phone] if you need to cancel or reschedule your appointment.   If you are experiencing a Mental Health or Behavioral Health Crisis or need someone to talk to, please call the Suicide and Crisis Lifeline: 988 call the [place] National Suicide Prevention Lifeline: 4035859344 or TTY: 2546539376 TTY 7273804272) to talk to a trained counselor call 1-800-273-TALK (toll free, 24 hour hotline) call the [facility]: [phone] call 911   Patient verbalizes understanding of instructions and care plan provided today and agrees to view in MyChart. Active MyChart status and patient understanding of how to access instructions and care plan via MyChart confirmed with patient.     The patient has been provided with contact information for the care management team and has been advised to call with any health related questions or concerns.    L. Noelle Penner, RN, BSN, CCM [facility] Care Management Community Coordinator Office number [phone]

## 2022-08-30 DIAGNOSIS — Z79899 Other long term (current) drug therapy: Secondary | ICD-10-CM | POA: Diagnosis not present

## 2022-09-02 ENCOUNTER — Other Ambulatory Visit: Payer: Self-pay

## 2022-09-02 ENCOUNTER — Telehealth: Payer: Self-pay

## 2022-09-02 ENCOUNTER — Other Ambulatory Visit: Payer: Self-pay | Admitting: Family Medicine

## 2022-09-02 DIAGNOSIS — E782 Mixed hyperlipidemia: Secondary | ICD-10-CM

## 2022-09-02 DIAGNOSIS — E1169 Type 2 diabetes mellitus with other specified complication: Secondary | ICD-10-CM

## 2022-09-02 DIAGNOSIS — I340 Nonrheumatic mitral (valve) insufficiency: Secondary | ICD-10-CM

## 2022-09-02 DIAGNOSIS — I10 Essential (primary) hypertension: Secondary | ICD-10-CM

## 2022-09-02 MED ORDER — BLOOD GLUCOSE TEST VI STRP: 1.0000 | ORAL_STRIP | Freq: Three times a day (TID) | 2 refills | Status: DC

## 2022-09-02 MED ORDER — FUROSEMIDE 20 MG PO TABS
20.0000 mg | ORAL_TABLET | Freq: Every day | ORAL | 3 refills | Status: DC
Start: 2022-09-02 — End: 2022-11-23

## 2022-09-02 NOTE — Telephone Encounter (Signed)
Last OV 08/20/22 Requested Prescriptions  Pending Prescriptions Disp Refills   mirtazapine (REMERON) 30 MG tablet [Pharmacy Med Name: MIRTAZAPINE 30 MG TABLET] 30 tablet 2    Sig: TAKE 1 TABLET BY MOUTH EVERY NIGHT AT BEDTIME FOR APPETITE     Psychiatry: Antidepressants - mirtazapine Failed - 09/02/2022  6:10 AM      Failed - Valid encounter within last 6 months    Recent Outpatient Visits           1 year ago Right carotid bruit   [facility] Pickard, Priscille Heidelberg, MD   1 year ago Dehydration   [facility] Pickard, Priscille Heidelberg, MD   1 year ago Dysuria   [facility] Medicine [name], MD   1 year ago Type 2 diabetes mellitus without complication, without long-term current use of insulin (HCC)   [facility] Medicine Tanya Nones, Priscille Heidelberg, MD   2 years ago Benign essential HTN   [facility] Medicine Pickard, Priscille Heidelberg, MD       Future Appointments             In 2 months Pickard, Priscille Heidelberg, MD [facility], [facility]   In 3 months [place], Lisette Abu, MD [place] [facility]             benazepril (LOTENSIN) 40 MG tablet [Pharmacy Med Name: BENAZEPRIL HCL 40 MG TABLET] 90 tablet 0    Sig: TAKE 1 TABLET BY MOUTH DAILY     Cardiovascular:  ACE Inhibitors Failed - 09/02/2022  6:10 AM      Failed - Valid encounter within last 6 months    Recent Outpatient Visits           1 year ago Right carotid bruit   [facility] Medicine Pickard, Priscille Heidelberg, MD   1 year ago Dehydration   [facility] Family Medicine Pickard, Priscille Heidelberg, MD   1 year ago Dysuria   [facility] [name], MD   1 year ago Type 2 diabetes mellitus without complication, without long-term current use of insulin (HCC)   [facility] Family Medicine Pickard, Priscille Heidelberg, MD   2 years ago Benign essential HTN   [facility] Medicine Pickard, Priscille Heidelberg, MD       Future Appointments              In 2 months Pickard, Priscille Heidelberg, MD [facility], [facility]   In 3 months [place], Lisette Abu, MD [place] [facility] at [facility] - Cr in normal range and within 180 days    Creat  Date Value Ref Range Status  07/30/2022 1.08 0.70 - 1.22 mg/dL Final   Creatinine, Ser  Date Value Ref Range Status  08/30/2022 1.13 0.76 - 1.27 mg/dL Final   Creatinine, Urine  Date Value Ref Range Status  11/13/2021 94 20 - 320 mg/dL Final         Passed - K in normal range and within 180 days    Potassium  Date Value Ref Range Status  08/30/2022 4.7 3.5 - 5.2 mmol/L Final         Passed - Patient is not pregnant      Passed - Last BP in normal range    BP Readings from Last 1 Encounters:  08/23/22 (!) 120/52           

## 2022-09-02 NOTE — Telephone Encounter (Signed)
Pt states he has been having issues with increased swelling to his feet/legs. Pt states his Cardiologist started him on Spironolactone 25 mg 1/2 tablet daily. Pt states he has been taking it for 4 days. Pt states it is not helping. Thank you.

## 2022-09-03 ENCOUNTER — Other Ambulatory Visit (HOSPITAL_COMMUNITY): Payer: Self-pay

## 2022-09-06 ENCOUNTER — Telehealth: Payer: Self-pay | Admitting: Pharmacist

## 2022-09-06 ENCOUNTER — Encounter: Payer: Self-pay | Admitting: Internal Medicine

## 2022-09-06 ENCOUNTER — Ambulatory Visit: Payer: Medicare Other | Attending: Internal Medicine | Admitting: Internal Medicine

## 2022-09-06 VITALS — BP 108/56 | HR 84 | Resp 16 | Ht 67.0 in | Wt 150.0 lb

## 2022-09-06 DIAGNOSIS — I5022 Chronic systolic (congestive) heart failure: Secondary | ICD-10-CM | POA: Diagnosis not present

## 2022-09-06 DIAGNOSIS — E1169 Type 2 diabetes mellitus with other specified complication: Secondary | ICD-10-CM

## 2022-09-06 DIAGNOSIS — M316 Other giant cell arteritis: Secondary | ICD-10-CM | POA: Diagnosis not present

## 2022-09-06 DIAGNOSIS — Z79899 Other long term (current) drug therapy: Secondary | ICD-10-CM | POA: Diagnosis not present

## 2022-09-06 DIAGNOSIS — I4891 Unspecified atrial fibrillation: Secondary | ICD-10-CM

## 2022-09-06 DIAGNOSIS — E782 Mixed hyperlipidemia: Secondary | ICD-10-CM | POA: Diagnosis not present

## 2022-09-06 NOTE — Telephone Encounter (Addendum)
Pending baseline labs and OV note from today, please start Actemra SQ BIV  Dose: 162mg  SQ once weekly  Dx: GCA  ----- Message from Metta Clines sent at 09/06/2022  3:11 PM EDT ----- Regarding: Actemra New Start Have to get labs back first.

## 2022-09-06 NOTE — Progress Notes (Unsigned)
Referring Physician:  [name], MD [address] [place],  [place] [address]  Primary Physician:  [name], MD  History of Present Illness: 09/07/2022 Donald Rios is here today with a chief complaint of incidentally found aneurysmal disease when he presented to the emergency department earlier this month.  He has no family history of aneurysms.  He is non-smoker.  He quit 36 years ago.  He is currently asymptomatic.  Unfortunately, he did lose some of his vision with the neurologic event from earlier this month.  He  Review of Systems:  A 10 point review of systems is negative, except for the pertinent positives and negatives detailed in the HPI.  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    History of MI (myocardial infarction)    History of nuclear stress test 08/07/2010   dipyridamole; EKG negative for ischemia, low risk scan    Hyperlipidemia    Hyperplastic colon polyp 05/2008   Hypertension    Ischemic cardiomyopathy    EF 45%, with inferior wall motion abnormality    Myocardial infarction (HCC)    Valvular regurgitation    mitral and tricuspid (mild)    Past Surgical History: Past Surgical History:  Procedure Laterality Date   CARDIOVERSION N/A 08/16/2022   Procedure: CARDIOVERSION;  Surgeon: Jake Bathe, MD;  Location: [facility] INVASIVE CV LAB;  Service: Cardiovascular;  Laterality: N/A;   CORONARY ARTERY BYPASS GRAFT  2000   LIMA to LAD, free IMA to OM2, sequential graft to PLA &  PLD   ESOPHAGOGASTRODUODENOSCOPY N/A 05/07/2013   Procedure: ESOPHAGOGASTRODUODENOSCOPY (EGD);  Surgeon: Iva Boop, MD;  Location[facility] ENDOSCOPY;  Service: Endoscopy;  Laterality: N/A;   TEE WITHOUT CARDIOVERSION N/A 08/16/2022   Procedure: TRANSESOPHAGEAL ECHOCARDIOGRAM;  Surgeon: Jake Bathe, MD;  Location: [facility] INVASIVE CV LAB;  Service: Cardiovascular;  Laterality: N/A;   TRANSTHORACIC ECHOCARDIOGRAM  09/07/2010   EF 45-50%; LV systolic function mildly reduced; LA mildly dilated; mild-mod MR & mild-mod TR; aortic root sclerosis/calcification;     Allergies: Allergies as of 09/07/2022   (No Known Allergies)    Medications:  Current Outpatient Medications:    Accu-Chek Softclix Lancets lancets, Use in the morning, at noon, and at bedtime., Disp: 100 each, Rfl: 0   allopurinol (ZYLOPRIM) 300 MG tablet, TAKE 1 TABLET BY MOUTH DAILY (Patient taking differently: Take 300 mg by mouth at bedtime.), Disp: 90 tablet, Rfl: 1   apixaban (ELIQUIS) 5 MG TABS tablet, Take 1 tablet (5 mg total) by mouth 2 (two) times daily., Disp: 60 tablet, Rfl: 0   Blood Glucose Monitoring Suppl (BLOOD GLUCOSE MONITOR SYSTEM) w/Device KIT, 1 each by Does not apply route in the morning, at noon, and at bedtime., Disp: 1 kit, Rfl: 0   doxazosin (CARDURA) 2 MG tablet, TAKE 1 TABLET BY MOUTH DAILY (Patient taking differently: Take 2 mg by mouth at bedtime.),  Disp: 90 tablet, Rfl: 3   empagliflozin (JARDIANCE) 10 MG TABS tablet, Take 1 tablet (10 mg total) by mouth daily., Disp: 30 tablet, Rfl: 2   furosemide (LASIX) 20 MG tablet, Take 1 tablet (20 mg total) by mouth daily., Disp: 30 tablet, Rfl: 3   glipiZIDE (GLIPIZIDE XL) 2.5 MG 24 hr tablet, Take 1 tablet (2.5 mg total) by mouth daily with breakfast., Disp: 30 tablet, Rfl: 1   Glucose Blood (BLOOD GLUCOSE TEST STRIPS) STRP, Use in the morning, at noon, and at bedtime. May substitute to any manufacturer covered by patient's insurance., Disp: 100 strip, Rfl: 2    insulin aspart (FIASP FLEXTOUCH) 100 UNIT/ML FlexTouch Pen, Before each meal 3 times a day, 140-199 - 2 units, 200-250 - 4 units, 251-299 - 6 units,  300-349 - 8 units,  350 or above 10 units., Disp: 9 mL, Rfl: 0   Insulin Pen Needle 32G X 4 MM MISC, For 4 times a day insulin injection under the skin, 1 month supply., Disp: 100 each, Rfl: 0   Lancet Device MISC, 1 each by Does not apply route in the morning, at noon, and at bedtime. May substitute to any manufacturer covered by patient's insurance., Disp: 1 each, Rfl: 0   losartan (COZAAR) 25 MG tablet, Take 1 tablet (25 mg total) by mouth daily., Disp: 90 tablet, Rfl: 1   metoprolol tartrate (LOPRESSOR) 50 MG tablet, TAKE 1 AND 1/2 TABLETS BY MOUTH TWO TIMES A DAY, Disp: 90 tablet, Rfl: 0   mirtazapine (REMERON) 30 MG tablet, TAKE 1 TABLET BY MOUTH EVERY NIGHT AT BEDTIME FOR APPETITE, Disp: 90 tablet, Rfl: 1   pantoprazole (PROTONIX) 40 MG tablet, Take 1 tablet (40 mg total) by mouth daily., Disp: 30 tablet, Rfl: 3   predniSONE (DELTASONE) 20 MG tablet, Take 3 tablets (60 mg total) by mouth daily with breakfast., Disp: 100 tablet, Rfl: 1   simvastatin (ZOCOR) 40 MG tablet, TAKE ONE TABLET BY MOUTH DAILY (Patient taking differently: Take 40 mg by mouth at bedtime.), Disp: 90 tablet, Rfl: 3   spironolactone (ALDACTONE) 25 MG tablet, Take 0.5 tablets (12.5 mg total) by mouth daily., Disp: 90 tablet, Rfl: 3  Social History: Social History   Tobacco Use   Smoking status: Former    Current packs/day: 0.00    Types: Cigarettes    Quit date: 11/04/1985    Years since quitting: 36.8   Smokeless tobacco: Never  Vaping Use   Vaping status: Never Used  Substance Use Topics   Alcohol use: Not Currently    Alcohol/week: 2.0 - 3.0 standard drinks of alcohol    Types: 2 - 3 Cans of beer per week   Drug use: No    Family Medical History: Family History  Problem Relation Age of Onset   Stroke Father    Hypertension Father    Diabetes Brother     Esophageal cancer Brother    Stroke Maternal Grandmother    Stroke Maternal Grandfather    Stomach cancer Maternal Aunt    Colon cancer Neg Hx    Rectal cancer Neg Hx     Physical Examination: Vitals:   09/07/22 1433  BP: 130/68    General: Patient is in no apparent distress. Attention to examination is appropriate.  Neck:   Supple.  Full range of motion.  Respiratory: Patient is breathing without any difficulty.   NEUROLOGICAL:     Awake, alert, oriented to person, place, and time.  Speech is clear and  fluent.   Cranial Nerves: Pupils equal round and reactive to light.  Facial tone is symmetric.  Facial sensation is symmetric. Shoulder shrug is symmetric. Tongue protrusion is midline.  There is no pronator drift.  Strength: Side Biceps Triceps Deltoid Interossei Grip Wrist Ext. Wrist Flex.  R 5 5 5 5 5 5 5   L 5 5 5 5 5 5 5    Side Iliopsoas Quads Hamstring PF DF EHL  R 5 5 5 5 5 5   L 5 5 5 5 5 5    Reflexes are 1+ and symmetric at the biceps, triceps, brachioradialis, patella and achilles.   Hoffman's is absent.   Bilateral upper and lower extremity sensation is intact to light touch.    No evidence of dysmetria noted.  Gait is normal.     Medical Decision Making  Imaging: MRIA Head and Neck 08/13/2022 IMPRESSION: MRI HEAD IMPRESSION:   1. No acute intracranial abnormality. 2. 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 3. Innumerable punctate chronic micro hemorrhages involving both cerebral hemispheres. Overall pattern is nonspecific, with differential considerations including sequelae of prior cardiac bypass, chronic hypertension, cerebral amyloid angiopathy, prior trauma, or possibly vasculitis.   MRA HEAD IMPRESSION:   1. Negative intracranial MRA for large vessel occlusion. No hemodynamically significant or correctable stenosis. 2. 4 mm left MCA bifurcation aneurysm. 3. 2 mm  cavernous right ICA aneurysm. 4. 3 mm cavernous left ICA aneurysm. 5. 3 mm supraclinoid left ICA aneurysm.   MRA NECK IMPRESSION:   1. No acute vascular abnormality within the neck. 2. Mild atheromatous irregularity about both carotid artery systems within the neck without hemodynamically significant greater than 50% stenosis. 3. Patent vertebral arteries within the neck. Right vertebral artery dominant.     Electronically Signed   By: Rise Mu M.D.   On: 08/13/2022 20:43    I have personally reviewed the images and agree with the above interpretation.  Assessment and Plan: Mr. Cleere is a pleasant 82 y.o. male with multiple small intracranial aneurysms.  These are very low risk for rupture, with risk of somewhere between 1 and 3% over the next 5 years.  We will follow this with repeat MR angiogram of the head in approximately 6 months.  I will then discuss with him whether he would like to continue following this over time.    I spent a total of 30 minutes in this patient's care today. This time was spent reviewing pertinent records including imaging studies, obtaining and confirming history, performing a directed evaluation, formulating and discussing my recommendations, and documenting the visit within the medical record.     Thank you for involving me in the care of this patient.      Remo Kirschenmann K. Myer Haff MD, [facility]

## 2022-09-06 NOTE — Patient Instructions (Signed)
Tocilizumab Injection What is this medication? TOCILIZUMAB (TOE si LIZ ue mab) treats autoimmune conditions, such as arthritis. It works by slowing down an overactive immune system. It may also be used to treat severe COVID-19 in people who are hospitalized. It is a monoclonal antibody. This medicine may be used for other purposes; ask your health care provider or pharmacist if you have questions. COMMON BRAND NAME(S): Actemra, TOFIDENCE, Tyenne What should I tell my care team before I take this medication? They need to know if you have any of these conditions: Cancer Diabetes Heart disease History of or current hepatitis B infection High blood pressure High cholesterol Immune system problems Infection Liver disease Low blood counts, such as low white cell, platelet, or red cell counts Multiple sclerosis Recent or upcoming vaccine Stomach or intestine problems Stroke An unusual or allergic reaction to tocilizumab, other medications, foods, dyes, or preservatives Pregnant or trying to get pregnant Breast-feeding How should I use this medication? This medication is injected into a vein or under the skin. It may be given by your care team in a hospital or clinic setting. It may also be given at home. If you get this medication at home, you will be taught how to prepare and give it. Use it exactly as directed. Take it as directed on the prescription label. Keep taking it unless your care team tells you to stop. If you use a pen, be sure to take off the outer needle cover before using the dose. It is important that you put your used needles and syringes in a special sharps container. Do not put them in a trash can. If you do not have a sharps container, call your pharmacist or care team to get one. A special MedGuide will be given to you by the pharmacist with each prescription and refill. Be sure to read this information carefully each time. Talk to your care team about the use of this  medication in children. While it may be prescribed for children as young as 2 years for selected conditions, precautions do apply. Overdosage: If you think you have taken too much of this medicine contact a poison control center or emergency room at once. NOTE: This medicine is only for you. Do not share this medicine with others. What if I miss a dose? If you get this medication at the hospital or clinic: It is important not to miss your dose. Call your care team if you are unable to keep an appointment. If you give yourself this medication at home: If you miss a dose, take it as soon as you can. If it is almost time for your next dose, take only that dose. Do not take double or extra doses. Call your care team with questions. What may interact with this medication? Do not take this medication with any of the following: Live virus vaccines This medication may also interact with the following: Biologic medications, such as abatacept, adalimumab, anakinra, certolizumab, etanercept, golimumab, infliximab, rituximab, secukinumab, ustekinumab Certain medications for cholesterol, such as atorvastatin, lovastatin, simvastatin Cyclosporine Estrogen or progestin hormones Omeprazole Steroid medications, such as prednisone or cortisone Theophylline Vaccines Warfarin This list may not describe all possible interactions. Give your health care provider a list of all the medicines, herbs, non-prescription drugs, or dietary supplements you use. Also tell them if you smoke, drink alcohol, or use illegal drugs. Some items may interact with your medicine. What should I watch for while using this medication? Visit your care team for regular  checks on your progress. Your condition will be monitored carefully while you are receiving this medication. Tell your care team if your symptoms do not start to get better or if they get worse. You may need blood work done while taking this medication. You will be tested for  tuberculosis (TB) before you start this medication. If your care team prescribes any medication for TB, you should start taking the TB medication before starting this medication. Make sure to finish the full course of TB medication. This medication may increase your risk of getting an infection. Call your care team for advice if you get a fever, chills, sore throat, or other symptoms of a cold or flu. Do not treat yourself. Try to avoid being around people who are sick. Talk to your care team about your risk of cancer. You may be more at risk for certain types of cancers if you take this medication. What side effects may I notice from receiving this medication? Side effects that you should report to your care team as soon as possible: Allergic reactions--skin rash, itching, hives, swelling of the face, lips, tongue, or throat Infection--fever, chills, cough, sore throat, wounds that don't heal, pain or trouble when passing urine, general feeling of discomfort or being unwell Liver injury--right upper belly pain, loss of appetite, nausea, light-colored stool, dark yellow or brown urine, yellowing skin or eyes, unusual weakness or fatigue Stomach pain that is severe, does not go away, or gets worse Unusual bruising or bleeding Side effects that usually do not require medical attention (report to your care team if they continue or are bothersome): Dizziness Headache Increase in blood pressure Pain, redness, or irritation at injection site Runny or stuffy nose Sore throat Stomach pain This list may not describe all possible side effects. Call your doctor for medical advice about side effects. You may report side effects to FDA at 1-800-FDA-1088. Where should I keep my medication? Keep out of the reach of children and pets. You will be instructed on how to store this medication. Get rid of any unused medication after the expiration date on the label. To get rid of medications that are no longer  needed or have expired: Take the medication to a medication take-back program. Check with your pharmacy or law enforcement to find a location. If you cannot return the medication, ask your pharmacist or care team how to get rid of this medication safely. NOTE: This sheet is a summary. It may not cover all possible information. If you have questions about this medicine, talk to your doctor, pharmacist, or health care provider.  2024 [employer] (2021-04-08 00:00:00)

## 2022-09-06 NOTE — Progress Notes (Signed)
Office Visit Note  Patient: Donald Rios             Date of Birth: Aug 08, 1940           MRN: [id number]             PCP: [name], MD Referring: [name], MD Visit Date: 09/06/2022  Subjective:  New Patient (Initial Visit) (Patient states he does not know why he is here. )   History of Present Illness: Donald Rios is a 82 y.o. 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  He has a medical history significant for coronary artery disease status post CABG, ischemic cardiomyopathy, type 2 diabetes, gout, and PMR.  Symptoms started at the beginning of this month with a sudden onset of left eye floaters and blurriness in his vision initially in the lower part of the field and then across his entire eye.  This progressively worsened over 1 to 2 days he saw his ophthalmologist for this found to have disc edema with highly elevated sedimentation rate and CRP and sent to the emergency department for evaluation suspected giant cell arteritis.  On admission he was also found to be in A-fib with RVR which is new for him.  After the onset of vision changes he also developed jaw pain this was present mildly at rest and worsened with chewing.  Did not experience any recurrence of previous shoulder or hip pain or stiffness.  In hospital he was found to have elevated serum inflammatory markers ultrasound examination of the temporal arteries indicated a positive halo sign.  He underwent MR angiography of the head and neck that showed extensive arterial disease and microvascular disease throughout without any definite large vessel arteritis.  He was initially treated with high-dose intravenous steroids then transition to prednisone 60 mg daily.  Had improvement of symptoms jaw pain completely resolved after about 5 days in total.  Unfortunately left eye vision did not significantly improve has some light sensitivity and can see specks but no  visual acuity. Right eye has some cloudiness in his vision this has been present since before these recent events and not changed since they started. He began gradual prednisone taper has decreased down to 20 mg daily at this time has not noticed any particular change or return of symptoms with decrease in the dose.  Does have increase in appetite, increase leg swelling, and increased blood sugars since starting the high-dose prednisone. He underwent TEE with direct-current cardioversion on August 5 unfortunately was reverted back into A-fib by time of cardiology follow-up 1 week later.  Findings also indicated decrease in his previous ejection fraction now indicating 30% with moderate to severe mitral regurgitation. He has a previous history of polymyalgia rheumatica was diagnosed and managed with Dr. Dierdre Forth at [facility] rheumatology from patient's report started in 2018.  This was characterized by fairly typical sounding shoulder and hip joint pain and stiffness and was treated with a long course of oral steroids.  He had been off any maintenance treatment for years and is no longer actively followed with their office.   Labs reviewed 08/12/2022 ESR 51 CRP 56.3 Lipids HDL 34 else WNL  08/2020 HCV neg  Imaging reviewed 08/19/22 BT Abdomen Pelvis W Contrast IMPRESSION: Colonic diverticulosis, without radiographic evidence of diverticulitis or other acute findings.   Cholelithiasis. No radiographic evidence of cholecystitis.   Small left pleural effusion.   08/14/22 Temporal Artery  [place] Technically difficult and limited study, see indications above. No compression of bilateral temporal arteries at proximal, mid, or distal  segments. Abnormal flow noted throughout. Summary:  Presence of a "halo" sign in the bilateral temporal artery suggests temporal arteritis.    08/13/22 MRI/MRA Head/Neck IMPRESSION: MRI HEAD IMPRESSION:   1. No acute intracranial abnormality. 2. 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 3. Innumerable punctate chronic micro hemorrhages involving both cerebral hemispheres. Overall pattern is nonspecific, with differential considerations including sequelae of prior cardiac bypass, chronic hypertension, cerebral amyloid angiopathy, prior trauma, or possibly vasculitis.   MRA HEAD IMPRESSION:   1. Negative intracranial MRA for large vessel occlusion. No hemodynamically significant or correctable stenosis. 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 5. 3 mm supraclinoid left ICA aneurysm.   MRA NECK IMPRESSION:   1. No acute vascular abnormality within the neck. 2. Mild atheromatous irregularity about both carotid artery systems within the neck without hemodynamically significant greater than 50% stenosis. 3. Patent vertebral arteries within the neck. Right vertebral artery dominant.  Activities of Daily Living:  Patient reports morning stiffness for 0 minutes.   Patient Denies nocturnal pain.  Difficulty dressing/grooming: Reports Difficulty climbing stairs: Denies Difficulty getting out of chair: Denies Difficulty using hands for taps, buttons, cutlery, and/or writing: Denies  Review of Systems  Constitutional:  Positive for fatigue.  HENT:  Negative for mouth sores and mouth dryness.   Eyes:  Negative for dryness.  Respiratory:  Positive for shortness of breath.   Cardiovascular:  Negative for chest pain and palpitations.  Gastrointestinal:  Negative for blood in stool, constipation and diarrhea.  Endocrine: Positive for increased urination.  Genitourinary:  Negative for involuntary urination.  Musculoskeletal:  Negative for joint pain, gait problem, joint pain, joint swelling, myalgias, muscle weakness, morning stiffness, muscle tenderness and myalgias.  Skin:  Negative for color change, rash, hair loss and  sensitivity to sunlight.  Allergic/Immunologic: Negative for susceptible to infections.  Neurological:  Negative for dizziness and headaches.  Hematological:  Negative for swollen glands.  Psychiatric/Behavioral:  Negative for depressed mood and sleep disturbance. The patient is not nervous/anxious.     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 08/30/2017   Cardiomyopathy, ischemic 12/23/2015   Chronic systolic congestive heart failure, NYHA class 1 (HCC) 12/23/2015   Acute gastric ulcer with hemorrhage 05/06/2013   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    Mild tricuspid regurgitation    Mitral regurgitation    Atrial enlargement, left    Hearing loss    Diverticulosis    Hiatal hernia    Gout    DM2 (diabetes mellitus, type 2) (HCC)    GERD (gastroesophageal reflux disease)    Mixed hyperlipidemia    Essential hypertension     Past Medical History:  Diagnosis Date   Acute gastric ulcer with hemorrhage 05/06/2013   Arthritis    Asthma    Atrial enlargement, left    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    Cataract    COPD (chronic obstructive pulmonary  disease) (HCC)    Diabetes mellitus    Diabetic nephropathy (HCC)    Diverticulosis    ED (erectile dysfunction)    GERD (gastroesophageal reflux disease)    Gout    Hearing loss    Hiatal hernia 05/2008   EGD with HH and reflux esophagitis.    History of MI (myocardial infarction)    History of nuclear stress test 08/07/2010   dipyridamole; EKG negative for ischemia, low risk scan    Hyperlipidemia    Hyperplastic colon polyp 05/2008   Hypertension    Ischemic  cardiomyopathy    EF 45%, with inferior wall motion abnormality    Myocardial infarction (HCC)    Valvular regurgitation    mitral and tricuspid (mild)    Family History  Problem Relation Age of Onset   Stroke Father    Hypertension Father    Diabetes Brother    Esophageal cancer Brother    Stroke Maternal Grandmother    Stroke Maternal Grandfather    Stomach cancer Maternal Aunt    Colon cancer Neg Hx    Rectal cancer Neg Hx    Past Surgical History:  Procedure Laterality Date   CARDIOVERSION N/A 08/16/2022   Procedure: CARDIOVERSION;  Surgeon: Jake Bathe, MD;  Location: [facility] INVASIVE CV LAB;  Service: Cardiovascular;  Laterality: N/A;   CORONARY ARTERY BYPASS GRAFT  2000   LIMA to LAD, free IMA to OM2, sequential graft to PLA & PLD   ESOPHAGOGASTRODUODENOSCOPY N/A 05/07/2013   Procedure: ESOPHAGOGASTRODUODENOSCOPY (EGD);  Surgeon: Iva Boop, MD;  Location: [facility] ENDOSCOPY;  Service: Endoscopy;  Laterality: N/A;   TEE WITHOUT CARDIOVERSION N/A 08/16/2022   Procedure: TRANSESOPHAGEAL ECHOCARDIOGRAM;  Surgeon: Jake Bathe, MD;  Location: [facility] INVASIVE CV LAB;  Service: Cardiovascular;  Laterality: N/A;   TRANSTHORACIC ECHOCARDIOGRAM  09/07/2010   EF 45-50%; LV systolic function mildly reduced; LA mildly dilated; mild-mod MR & mild-mod TR; aortic root sclerosis/calcification;    Social History   Social History Narrative   Not on file   Immunization History  Administered Date(s) Administered   COVID-19, mRNA, vaccine(Comirnaty)12 years and older 12/31/2021   Fluad Quad(high Dose 65+) 10/17/2018, 10/29/2019, 12/16/2020, 11/16/2021   Influenza Whole 11/22/2002, 11/07/2003, 10/06/2009   Influenza, High Dose Seasonal PF 10/17/2017   Influenza,inj,Quad PF,6+ Mos 11/02/2012, 10/01/2013, 10/04/2014, 10/03/2015, 10/11/2016   PFIZER(Purple Top)SARS-COV-2 Vaccination 02/20/2019, 03/17/2019, 11/10/2019   Pfizer Covid-19 Vaccine Bivalent Booster 6yrs & up 11/06/2020   Pneumococcal  Conjugate-13 01/26/2013   Pneumococcal Polysaccharide-23 10/27/1998, 03/31/2015   Td 03/27/2009   Tdap 05/24/2011   Zoster, Live 01/03/2012     Objective: Vital Signs: BP (!) 108/56 (BP Location: Right Arm, Patient Position: Sitting, Cuff Size: Normal)   Pulse 84   Resp 16   Ht 5\' 7"  (1.702 m)   Wt 150 lb (68 kg)   BMI 23.49 kg/m    Physical Exam HENT:     Mouth/Throat:     Mouth: Mucous membranes are moist.     Pharynx: Oropharynx is clear.  Eyes:     Conjunctiva/sclera: Conjunctivae normal.  Cardiovascular:     Rate and Rhythm: Normal rate. Rhythm irregular.     Heart sounds: Murmur heard.  Pulmonary:     Effort: Pulmonary effort is normal.     Breath sounds: Normal breath sounds.  Musculoskeletal:     Right lower leg: Edema present.     Left lower leg: Edema present.     Comments: 2+ pitting, in compression socks  Lymphadenopathy:     Cervical: No cervical adenopathy.  Skin:    General: Skin is warm and dry.     Findings: Bruising present.  Neurological:     Mental Status: He is alert.  Psychiatric:        Mood and Affect: Mood normal.      Musculoskeletal Exam:  Neck full ROM no tenderness Shoulders slightly restricted overhead abduction, no tenderness to pressure or pain with resisted movement Elbows full ROM no tenderness or swelling Wrists full ROM no tenderness or swelling Fingers full ROM no tenderness or swelling Knees full ROM no tenderness or swelling   Investigation: No additional findings.  Imaging: CT ABDOMEN PELVIS W CONTRAST  Result Date: 08/22/2022 CLINICAL DATA:  Left lower quadrant pain. Unintentional weight loss. EXAM: CT ABDOMEN AND PELVIS WITH CONTRAST TECHNIQUE: Multidetector CT imaging of the abdomen and pelvis was performed using the standard protocol following bolus administration of intravenous contrast. RADIATION DOSE REDUCTION: This exam was performed according to the departmental dose-optimization program which includes  automated exposure control, adjustment of the mA and/or kV according to patient size and/or use of iterative reconstruction technique. CONTRAST:  ISOVUE-300 IOPAMIDOL (ISOVUE-300) INJECTION 61% COMPARISON:  None Available. FINDINGS: Lower Chest: Small left pleural effusion. Hepatobiliary: No suspicious hepatic masses identified. Multiple tiny hepatic cysts are seen. Gallstones are seen, however there is no evidence of cholecystitis or biliary dilatation. Pancreas:  No mass or inflammatory changes. Spleen: Within normal limits in size and appearance. Adrenals/Urinary Tract: No suspicious masses identified. No evidence of ureteral calculi or hydronephrosis. Stomach/Bowel: No evidence of obstruction, inflammatory process or abnormal fluid collections. Diverticulosis is seen involving the descending and sigmoid colon, however there is no evidence of diverticulitis. Vascular/Lymphatic: No pathologically enlarged lymph nodes. No acute vascular findings. Reproductive: No mass or other significant abnormality. Penile prosthesis seen with reservoir noted in the right suprapubic and paravesicle region. Other:  None. Musculoskeletal:  No suspicious bone lesions identified. IMPRESSION: Colonic diverticulosis, without radiographic evidence of diverticulitis or other acute findings. Cholelithiasis. No radiographic evidence of cholecystitis. Small left pleural effusion. Electronically Signed   By: Danae Orleans M.D.   On: 08/22/2022 13:29   VAS [place] TEMPORAL ARTERY BILATERAL  Result Date: 08/18/2022  TEMPORAL ARTERY REPORT Patient Name:  JORDANO CORP  Date of Exam:   08/14/2022 Medical Rec #: [id number]       Accession #:    [id number] Date of Birth: 28-Oct-1940       Patient Gender: M Patient Age:   84 years Exam Location:  [facility] Procedure:      VAS [place] TEMPORAL ARTERY BILATERAL Referring Phys: MCNEILL KIRKPATRICK --------------------------------------------------------------------------------  Indications: Jaw  pain, blurred vision , abnormal lab values and Ophthalmologist              noted edema of disc, sent patient for Labs, ESR and CRP elevated.              This study was technically difficult secondary to patient movement,              coughing, and talking. High Risk Factors: Age > 50 yrs.  Comparison Study: No prior study on file Performing Technologist: Sherren Kerns RVS  Examination Guidelines: Patient in reclined position. 2D, color and spectral doppler sampling in the temporal artery along the hairline and temple in the longitudinal plane. 2D images along the hairline and temple in the transverse plane. Exam is bilateral.  ++-------------------+------------------+ Right Halo POSITIVELeft Halo POSITIVE ++-------------------+------------------+ +--------+----------+-----------+----------+-----------+  Width (cm)Length (cm)Width (cm)Length (cm) +--------+----------+-----------+----------+-----------+ Proximal0.15                 0.13                  +--------+----------+-----------+----------+-----------+ Mid     0.14                 0.10                  +--------+----------+-----------+----------+-----------+ Distal  0.13                 0.06                  +--------+----------+-----------+----------+-----------+ Technically difficult and limited study, see indications above. No compression of bilateral temporal arteries at proximal, mid, or distal segments. Abnormal flow noted throughout. 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  *See table(s) above for measurements and observations.  Suggest Peripheral Vascular Consult. Electronically signed by Delia Heady MD on 08/18/2022 at 2:29:55 PM.   Final    ECHO TEE  Result Date: 08/16/2022    TRANSESOPHOGEAL ECHO REPORT   Patient Name:   JOSEROBERTO BUNGAY Date of Exam: 08/16/2022 Medical Rec #:  [id number]      Height:       68.0 in Accession #:    [id number]     Weight:        144.9 lb Date of Birth:  Oct 28, 1940      BSA:          1.782 m Patient Age:    81 years       BP:           134/63 mmHg Patient Gender: M              HR:           82 bpm. Exam Location:  Inpatient Procedure: Transesophageal Echo, 3D Echo, Color Doppler and Cardiac Doppler Indications:     Arrhythmia  History:         Patient has prior history of Echocardiogram examinations, most                  recent 08/14/2022. Abnormal ECG, COPD, Mitral Valve Disease; Risk                  Factors:Hypertension and Diabetes.  Sonographer:     Darlys Gales Referring Phys:  [id number] Veverly Fells SKAINS Diagnosing Phys: [name] MD PROCEDURE: After discussion of the risks and benefits of a TEE, an informed consent was obtained from the patient. The transesophogeal probe was passed without difficulty through the esophogus of the patient. Sedation performed by different physician. The patient was monitored while under deep sedation. Anesthestetic sedation was provided intravenously by Anesthesiology: 110mg  of Propofol, 100mg  of Lidocaine. The patient's vital signs; including heart rate, blood pressure, and oxygen saturation; remained stable throughout the procedure. The patient developed no complications during the procedure. A successful direct current cardioversion was performed at 200 joules with 1 attempt.  IMPRESSIONS  1. Left ventricular ejection fraction, by estimation, is 25 to 30%. The left ventricle has severely decreased function. The left ventricle demonstrates global hypokinesis.  2. Right ventricular systolic function is normal. The right ventricular size is normal.  3. Left atrial size was moderately dilated. No left atrial/left atrial appendage thrombus was detected.  4. The mitral valve is normal in structure. Moderate to severe mitral  valve regurgitation. No evidence of mitral stenosis.  5. The aortic valve is tricuspid. Aortic valve regurgitation is not visualized. No aortic stenosis is present.  6. The inferior vena cava  is normal in size with greater than 50% respiratory variability, suggesting right atrial pressure of 3 mmHg. FINDINGS  Left Ventricle: Left ventricular ejection fraction, by estimation, is 25 to 30%. The left ventricle has severely decreased function. The left ventricle demonstrates global hypokinesis. The left ventricular internal cavity size was normal in size. There is no left ventricular hypertrophy. Right Ventricle: The right ventricular size is normal. No increase in right ventricular wall thickness. Right ventricular systolic function is normal. Left Atrium: Left atrial size was moderately dilated. No left atrial/left atrial appendage thrombus was detected. Right Atrium: Right atrial size was normal in size. Pericardium: There is no evidence of pericardial effusion. Mitral Valve: The mitral valve is normal in structure. Moderate to severe mitral valve regurgitation. No evidence of mitral valve stenosis. Tricuspid Valve: The tricuspid valve is normal in structure. Tricuspid valve regurgitation is mild . No evidence of tricuspid stenosis. Aortic Valve: The aortic valve is tricuspid. Aortic valve regurgitation is not visualized. No aortic stenosis is present. Pulmonic Valve: The pulmonic valve was normal in structure. Pulmonic valve regurgitation is not visualized. No evidence of pulmonic stenosis. Aorta: The aortic root is normal in size and structure. Venous: The inferior vena cava is normal in size with greater than 50% respiratory variability, suggesting right atrial pressure of 3 mmHg. IAS/Shunts: No atrial level shunt detected by color flow Doppler. [name] MD Electronically signed by [name] MD Signature Date/Time: 08/16/2022/11:23:05 AM    Final    EP STUDY  Result Date: 08/16/2022 See surgical note for result.  ECHOCARDIOGRAM COMPLETE  Result Date: 08/14/2022    ECHOCARDIOGRAM REPORT   Patient Name:   AVARD JANSSEN Date of Exam: 08/14/2022 Medical Rec #:  [id number]      Height:       68.0 in  Accession #:    [id number]     Weight:       144.9 lb Date of Birth:  07-07-1940      BSA:          1.782 m Patient Age:    81 years       BP:           111/75 mmHg Patient Gender: M              HR:           107 bpm. Exam Location:  Inpatient Procedure: 2D Echo, Cardiac Doppler, Color Doppler and Intracardiac            Opacification Agent Indications:    CHF-Acute Diastolic I50.31  History:        Patient has prior history of Echocardiogram examinations, most                 recent 10/02/2019. Cardiomyopathy and CHF, CAD, Prior CABG; Risk                 Factors:Hypertension, Diabetes and Dyslipidemia.  Sonographer:    Lucendia Herrlich Referring Phys: Effie Shy PRASHANT [facility] IMPRESSIONS  1. Left ventricular ejection fraction, by estimation, is 45 to 50%. The left ventricle has mildly decreased function. The left ventricle demonstrates global hypokinesis. There is mild left ventricular hypertrophy. Left ventricular diastolic function could not be evaluated. Elevated left ventricular end-diastolic pressure.  2. Right ventricular systolic function is normal.  The right ventricular size is normal. There is severely elevated pulmonary artery systolic pressure. The estimated right ventricular systolic pressure is 62.2 mmHg.  3. Left atrial size was severely dilated.  4. The mitral valve is normal in structure. Mild to moderate mitral valve regurgitation. No evidence of mitral stenosis.  5. The tricuspid valve is abnormal. Tricuspid valve regurgitation is mild to moderate.  6. The aortic valve is tricuspid. Aortic valve regurgitation is not visualized. No aortic stenosis is present.  7. The inferior vena cava is dilated in size with >50% respiratory variability, suggesting right atrial pressure of 8 mmHg. Comparison(s): Changes from prior study are noted. LVEF worsened to 45-50% from 60-65% in 2021. FINDINGS  Left Ventricle: Left ventricular ejection fraction, by estimation, is 45 to 50%. The left ventricle has mildly  decreased function. The left ventricle demonstrates global hypokinesis. Definity contrast agent was given IV to delineate the left ventricular  endocardial borders. The left ventricular internal cavity size was normal in size. There is mild left ventricular hypertrophy. Left ventricular diastolic function could not be evaluated due to atrial fibrillation. Left ventricular diastolic function could not be evaluated. Elevated left ventricular end-diastolic pressure. Right Ventricle: The right ventricular size is normal. No increase in right ventricular wall thickness. Right ventricular systolic function is normal. There is severely elevated pulmonary artery systolic pressure. The tricuspid regurgitant velocity is 3.68 m/s, and with an assumed right atrial pressure of 8 mmHg, the estimated right ventricular systolic pressure is 62.2 mmHg. Left Atrium: Left atrial size was severely dilated. Right Atrium: Right atrial size was normal in size. Pericardium: There is no evidence of pericardial effusion. Mitral Valve: The mitral valve is normal in structure. Mild to moderate mitral valve regurgitation. No evidence of mitral valve stenosis. Tricuspid Valve: The tricuspid valve is abnormal. Tricuspid valve regurgitation is mild to moderate. No evidence of tricuspid stenosis. Aortic Valve: The aortic valve is tricuspid. Aortic valve regurgitation is not visualized. No aortic stenosis is present. Aortic valve peak gradient measures 7.6 mmHg. Pulmonic Valve: The pulmonic valve was normal in structure. Pulmonic valve regurgitation is trivial. No evidence of pulmonic stenosis. Aorta: The aortic root and ascending aorta are structurally normal, with no evidence of dilitation. Venous: The inferior vena cava is dilated in size with greater than 50% respiratory variability, suggesting right atrial pressure of 8 mmHg. IAS/Shunts: No atrial level shunt detected by color flow Doppler.  LEFT VENTRICLE PLAX 2D LVIDd:         4.50 cm    Diastology LVIDs:         3.70 cm   LV e' medial:    6.96 cm/s LV PW:         1.25 cm   LV E/e' medial:  20.0 LV IVS:        1.20 cm   LV e' lateral:   10.90 cm/s LVOT diam:     2.20 cm   LV E/e' lateral: 12.8 LV SV:         56 LV SV Index:   31 LVOT Area:     3.80 cm  RIGHT VENTRICLE            IVC RV S prime:     9.90 cm/s  IVC diam: 2.40 cm TAPSE (M-mode): 1.5 cm LEFT ATRIUM             Index        RIGHT ATRIUM           Index  LA diam:        5.90 cm 3.31 cm/m   RA Area:     20.10 cm LA Vol (A2C):   85.5 ml 47.98 ml/m  RA Volume:   52.70 ml  29.57 ml/m LA Vol (A4C):   89.6 ml 50.28 ml/m LA Biplane Vol: 90.0 ml 50.50 ml/m  AORTIC VALVE                 PULMONIC VALVE AV Area (Vmax): 2.59 cm     PR End Diast Vel: 6.81 msec AV Vmax:        138.00 cm/s AV Peak Grad:   7.6 mmHg LVOT Vmax:      94.10 cm/s LVOT Vmean:     63.233 cm/s LVOT VTI:       0.148 m  AORTA Ao Root diam: 3.20 cm Ao Asc diam:  2.20 cm MITRAL VALVE                TRICUSPID VALVE MV Area (PHT): 4.96 cm     TR Peak grad:   54.2 mmHg MV Decel Time: 153 msec     TR Vmax:        368.00 cm/s MR Peak grad: 114.9 mmHg MR Vmax:      536.00 cm/s   SHUNTS MV E velocity: 139.00 cm/s  Systemic VTI:  0.15 m                             Systemic Diam: 2.20 cm Vishnu Priya Mallipeddi Electronically signed by Winfield Rast Mallipeddi Signature Date/Time: 08/14/2022/11:25:17 AM    Final    CT HEAD WO CONTRAST ( )  Result Date: 08/13/2022 CLINICAL DATA:  Initial evaluation for headache, neuro deficit. EXAM: CT HEAD WITHOUT CONTRAST TECHNIQUE: Contiguous axial images were obtained from the base of the skull through the vertex without intravenous contrast. RADIATION DOSE REDUCTION: This exam was performed according to the departmental dose-optimization program which includes automated exposure control, adjustment of the mA and/or kV according to patient size and/or use of iterative reconstruction technique. COMPARISON:  Prior brain MRI performed earlier the  same day. FINDINGS: Brain: Mild age-related cerebral atrophy with chronic small vessel ischemic disease. Remote infarcts involving the right basal ganglia, left thalamus, and left cerebellum. No acute intracranial hemorrhage. No acute large vessel territory infarct. No mass lesion or midline shift. No hydrocephalus or extra-axial fluid collection. Vascular: No abnormal hyperdense vessel. Calcified atherosclerosis present at skull base. Skull: Scalp soft tissues demonstrate no acute finding. Calvarium intact. Sinuses/Orbits: Globes and orbital soft tissues within normal limits. Paranasal sinuses and mastoid air cells are largely clear. Other: None. IMPRESSION: 1. No acute intracranial abnormality. 2. Remote infarcts involving the right basal ganglia, left thalamus, and left cerebellum. 3. Mild age-related cerebral atrophy with chronic small vessel ischemic disease. Electronically Signed   By: Rise Mu M.D.   On: 08/13/2022 20:46   MR BRAIN WO CONTRAST  Result Date: 08/13/2022 CLINICAL DATA:  Initial evaluation for headache, neuro deficit. EXAM: MRI HEAD WITHOUT CONTRAST MRA HEAD WITHOUT CONTRAST MRA NECK WITHOUT CONTRAST TECHNIQUE: Multiplanar, multiecho pulse sequences of the brain and surrounding structures were obtained without intravenous contrast. Angiographic images of the Circle of Willis were obtained using MRA technique without intravenous contrast. Angiographic images of the neck were obtained using MRA technique without intravenous contrast. Carotid stenosis measurements (when applicable) are obtained utilizing NASCET criteria, using the distal internal carotid diameter as the denominator. COMPARISON:  None Available. FINDINGS: MRI HEAD FINDINGS Brain: Cerebral volume within normal limits. Mild chronic microvascular ischemic disease for age. Remote lacunar infarct present at the anterior right basal ganglia. Additional small remote left cerebellar infarct. Associated chronic blood products  noted about these chronic infarcts. Additional chronic lacunar infarct present at the left thalamocapsular region. No evidence for acute or subacute ischemia. Gray-white matter differentiation maintained. No acute intracranial hemorrhage. Innumerable punctate chronic micro hemorrhages noted involving both cerebral hemispheres, overall pattern nonspecific. No mass lesion, midline shift or mass effect. No hydrocephalus or extra-axial fluid collection. Pituitary gland and suprasellar region within normal limits. Vascular: Major intracranial vascular flow voids are maintained. Skull and upper cervical spine: Cranial junction within normal limits. Bone marrow signal intensity normal. No scalp soft tissue abnormality. Sinuses/Orbits: Prior bilateral ocular lens replacement. Paranasal sinuses are largely clear. Trace right mastoid effusion noted to left of significance. Other: None. MRA HEAD FINDINGS ANTERIOR CIRCULATION: Visualized distal cervical segments of both internal carotid arteries are patent with antegrade flow. Atheromatous irregularity about the cavernous ICAs bilaterally without hemodynamically significant stenosis. 2 mm outpouching extending anteriorly from the cavernous right ICA consistent with a small aneurysm (series 5, image 77). Additional 3 mm outpouching extending posteriorly from the cavernous left ICA also suspicious for aneurysm (series 5, image 76). 3 mm outpouching extending inferiorly from the supraclinoid left ICA could reflect an additional small aneurysm (series 5, image 87). A1 segments patent bilaterally. Normal anterior communicating complex. Anterior cerebral arteries widely patent. No M1 stenosis or occlusion. 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 Distal MCA branches perfused and symmetric. POSTERIOR CIRCULATION: Both V4 segments are patent without stenosis. Right vertebral artery dominant. Right PICA patent at its origin. Left PICA origin not seen.  Basilar patent without stenosis. Superior cerebellar and posterior cerebral arteries widely patent bilaterally. MRA NECK FINDINGS AORTIC ARCH: Visualized aortic arch within normal limits for caliber with standard branch pattern. No visible stenosis about the origin of the great vessels. RIGHT CAROTID SYSTEM: Right common and internal carotid arteries are patent with antegrade flow. No evidence for dissection. Atheromatous irregularity about the right carotid bulb without hemodynamically significant greater than 50% stenosis. No ulceration. LEFT CAROTID SYSTEM: Left common and internal carotid arteries are patent with antegrade flow. No evidence for dissection. Atheromatous irregularity about the left carotid bulb without hemodynamically significant greater than 50% stenosis. No ulceration. VERTEBRAL ARTERIES: Both vertebral arteries arise from the subclavian arteries. No visible proximal subclavian artery stenosis. Right vertebral artery dominant. Vertebral arteries are patent with antegrade flow. No dissection or stenosis. IMPRESSION: MRI HEAD IMPRESSION: 1. No acute intracranial abnormality. 2. 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 3. Innumerable punctate chronic micro hemorrhages involving both cerebral hemispheres. Overall pattern is nonspecific, with differential considerations including sequelae of prior cardiac bypass, chronic hypertension, cerebral amyloid angiopathy, prior trauma, or possibly vasculitis. MRA HEAD IMPRESSION: 1. Negative intracranial MRA for large vessel occlusion. No hemodynamically significant or correctable stenosis. 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 5. 3 mm supraclinoid left ICA aneurysm. MRA NECK IMPRESSION: 1. No acute vascular abnormality within the neck. 2. Mild atheromatous irregularity about both carotid artery systems within the  neck without hemodynamically significant greater than 50% stenosis. 3. Patent vertebral arteries within the neck. Right vertebral artery dominant. Electronically Signed   By: Rise Mu M.D.   On:  08/13/2022 20:43   MR ANGIO HEAD WO CONTRAST  Result Date: 08/13/2022 CLINICAL DATA:  Initial evaluation for headache, neuro deficit. EXAM: MRI HEAD WITHOUT CONTRAST MRA HEAD WITHOUT CONTRAST MRA NECK WITHOUT CONTRAST TECHNIQUE: Multiplanar, multiecho pulse sequences of the brain and surrounding structures were obtained without intravenous contrast. Angiographic images of the Circle of Willis were obtained using MRA technique without intravenous contrast. Angiographic images of the neck were obtained using MRA technique without intravenous contrast. Carotid stenosis measurements (when applicable) are obtained utilizing NASCET criteria, using the distal internal carotid diameter as the denominator. COMPARISON:  None Available. FINDINGS: MRI HEAD FINDINGS Brain: Cerebral volume within normal limits. Mild chronic microvascular ischemic disease for age. Remote lacunar infarct present at the anterior right basal ganglia. Additional small remote left cerebellar infarct. Associated chronic blood products noted about these chronic infarcts. Additional chronic lacunar infarct present at the left thalamocapsular region. No evidence for acute or subacute ischemia. Gray-white matter differentiation maintained. No acute intracranial hemorrhage. Innumerable punctate chronic micro hemorrhages noted involving both cerebral hemispheres, overall pattern nonspecific. No mass lesion, midline shift or mass effect. No hydrocephalus or extra-axial fluid collection. Pituitary gland and suprasellar region within normal limits. Vascular: Major intracranial vascular flow voids are maintained. Skull and upper cervical spine: Cranial junction within normal limits. Bone marrow signal intensity normal. No scalp soft tissue abnormality.  Sinuses/Orbits: Prior bilateral ocular lens replacement. Paranasal sinuses are largely clear. Trace right mastoid effusion noted to left of significance. Other: None. MRA HEAD FINDINGS ANTERIOR CIRCULATION: Visualized distal cervical segments of both internal carotid arteries are patent with antegrade flow. Atheromatous irregularity about the cavernous ICAs bilaterally without hemodynamically significant stenosis. 2 mm outpouching extending anteriorly from the cavernous right ICA consistent with a small aneurysm (series 5, image 77). Additional 3 mm outpouching extending posteriorly from the cavernous left ICA also suspicious for aneurysm (series 5, image 76). 3 mm outpouching extending inferiorly from the supraclinoid left ICA could reflect an additional small aneurysm (series 5, image 87). A1 segments patent bilaterally. Normal anterior communicating complex. Anterior cerebral arteries widely patent. No M1 stenosis or occlusion. 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 Distal MCA branches perfused and symmetric. POSTERIOR CIRCULATION: Both V4 segments are patent without stenosis. Right vertebral artery dominant. Right PICA patent at its origin. Left PICA origin not seen. Basilar patent without stenosis. Superior cerebellar and posterior cerebral arteries widely patent bilaterally. MRA NECK FINDINGS AORTIC ARCH: Visualized aortic arch within normal limits for caliber with standard branch pattern. No visible stenosis about the origin of the great vessels. RIGHT CAROTID SYSTEM: Right common and internal carotid arteries are patent with antegrade flow. No evidence for dissection. Atheromatous irregularity about the right carotid bulb without hemodynamically significant greater than 50% stenosis. No ulceration. LEFT CAROTID SYSTEM: Left common and internal carotid arteries are patent with antegrade flow. No evidence for dissection. Atheromatous irregularity about the left carotid bulb  without hemodynamically significant greater than 50% stenosis. No ulceration. VERTEBRAL ARTERIES: Both vertebral arteries arise from the subclavian arteries. No visible proximal subclavian artery stenosis. Right vertebral artery dominant. Vertebral arteries are patent with antegrade flow. No dissection or stenosis. IMPRESSION: MRI HEAD IMPRESSION: 1. No acute intracranial abnormality. 2. 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 3. Innumerable punctate chronic micro hemorrhages involving both cerebral hemispheres. Overall pattern is nonspecific, with differential considerations including sequelae of prior cardiac bypass, chronic hypertension, cerebral amyloid angiopathy, prior trauma, or possibly  vasculitis. MRA HEAD IMPRESSION: 1. Negative intracranial MRA for large vessel occlusion. No hemodynamically significant or correctable stenosis. 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 5. 3 mm supraclinoid left ICA aneurysm. MRA NECK IMPRESSION: 1. No acute vascular abnormality within the neck. 2. Mild atheromatous irregularity about both carotid artery systems within the neck without hemodynamically significant greater than 50% stenosis. 3. Patent vertebral arteries within the neck. Right vertebral artery dominant. Electronically Signed   By: Rise Mu M.D.   On: 08/13/2022 20:43   MR ANGIO NECK WO CONTRAST  Result Date: 08/13/2022 CLINICAL DATA:  Initial evaluation for headache, neuro deficit. EXAM: MRI HEAD WITHOUT CONTRAST MRA HEAD WITHOUT CONTRAST MRA NECK WITHOUT CONTRAST TECHNIQUE: Multiplanar, multiecho pulse sequences of the brain and surrounding structures were obtained without intravenous contrast. Angiographic images of the Circle of Willis were obtained using MRA technique without intravenous contrast. Angiographic images of the neck were obtained using  MRA technique without intravenous contrast. Carotid stenosis measurements (when applicable) are obtained utilizing NASCET criteria, using the distal internal carotid diameter as the denominator. COMPARISON:  None Available. FINDINGS: MRI HEAD FINDINGS Brain: Cerebral volume within normal limits. Mild chronic microvascular ischemic disease for age. Remote lacunar infarct present at the anterior right basal ganglia. Additional small remote left cerebellar infarct. Associated chronic blood products noted about these chronic infarcts. Additional chronic lacunar infarct present at the left thalamocapsular region. No evidence for acute or subacute ischemia. Gray-white matter differentiation maintained. No acute intracranial hemorrhage. Innumerable punctate chronic micro hemorrhages noted involving both cerebral hemispheres, overall pattern nonspecific. No mass lesion, midline shift or mass effect. No hydrocephalus or extra-axial fluid collection. Pituitary gland and suprasellar region within normal limits. Vascular: Major intracranial vascular flow voids are maintained. Skull and upper cervical spine: Cranial junction within normal limits. Bone marrow signal intensity normal. No scalp soft tissue abnormality. Sinuses/Orbits: Prior bilateral ocular lens replacement. Paranasal sinuses are largely clear. Trace right mastoid effusion noted to left of significance. Other: None. MRA HEAD FINDINGS ANTERIOR CIRCULATION: Visualized distal cervical segments of both internal carotid arteries are patent with antegrade flow. Atheromatous irregularity about the cavernous ICAs bilaterally without hemodynamically significant stenosis. 2 mm outpouching extending anteriorly from the cavernous right ICA consistent with a small aneurysm (series 5, image 77). Additional 3 mm outpouching extending posteriorly from the cavernous left ICA also suspicious for aneurysm (series 5, image 76). 3 mm outpouching extending inferiorly from the  supraclinoid left ICA could reflect an additional small aneurysm (series 5, image 87). A1 segments patent bilaterally. Normal anterior communicating complex. Anterior cerebral arteries widely patent. No M1 stenosis or occlusion. 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 Distal MCA branches perfused and symmetric. POSTERIOR CIRCULATION: Both V4 segments are patent without stenosis. Right vertebral artery dominant. Right PICA patent at its origin. Left PICA origin not seen. Basilar patent without stenosis. Superior cerebellar and posterior cerebral arteries widely patent bilaterally. MRA NECK FINDINGS AORTIC ARCH: Visualized aortic arch within normal limits for caliber with standard branch pattern. No visible stenosis about the origin of the great vessels. RIGHT CAROTID SYSTEM: Right common and internal carotid arteries are patent with antegrade flow. No evidence for dissection. Atheromatous irregularity about the right carotid bulb without hemodynamically significant greater than 50% stenosis. No ulceration. LEFT CAROTID SYSTEM: Left common and internal carotid arteries are patent with antegrade flow. No evidence for dissection. Atheromatous irregularity about the  left carotid bulb without hemodynamically significant greater than 50% stenosis. No ulceration. VERTEBRAL ARTERIES: Both vertebral arteries arise from the subclavian arteries. No visible proximal subclavian artery stenosis. Right vertebral artery dominant. Vertebral arteries are patent with antegrade flow. No dissection or stenosis. IMPRESSION: MRI HEAD IMPRESSION: 1. No acute intracranial abnormality. 2. 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 3. Innumerable punctate chronic micro hemorrhages involving both cerebral hemispheres. Overall pattern is nonspecific, with differential considerations including sequelae of prior cardiac bypass, chronic hypertension,  cerebral amyloid angiopathy, prior trauma, or possibly vasculitis. MRA HEAD IMPRESSION: 1. Negative intracranial MRA for large vessel occlusion. No hemodynamically significant or correctable stenosis. 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 5. 3 mm supraclinoid left ICA aneurysm. MRA NECK IMPRESSION: 1. No acute vascular abnormality within the neck. 2. Mild atheromatous irregularity about both carotid artery systems within the neck without hemodynamically significant greater than 50% stenosis. 3. Patent vertebral arteries within the neck. Right vertebral artery dominant. Electronically Signed   By: Rise Mu M.D.   On: 08/13/2022 20:43    Recent Labs: Lab Results  Component Value Date   WBC WILL FOLLOW 08/30/2022   HGB WILL FOLLOW 08/30/2022   PLT WILL FOLLOW 08/30/2022   NA 142 08/30/2022   K 4.7 08/30/2022   CL 107 (H) 08/30/2022   CO2 22 08/30/2022   GLUCOSE 115 (H) 08/30/2022   BUN 22 08/30/2022   CREATININE 1.13 08/30/2022   BILITOT 0.6 08/16/2022   ALKPHOS 43 08/16/2022   AST 20 08/16/2022   ALT 14 08/16/2022   PROT 5.2 (L) 08/16/2022   ALBUMIN 2.8 (L) 08/16/2022   CALCIUM 8.5 (L) 08/30/2022   GFRAA 72 09/13/2019    Speciality Comments: No specialty comments available.  Procedures:  No procedures performed Allergies: Patient has no known allergies.   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  His history of previous PMR diagnosis about 6 years ago is also very consistent although not a common complication to arise.  Will request records from [facility] rheumatology. So far appears to be tolerating an initial prednisone taper.  Will recheck sedimentation rate and CRP normal continue on track with gradual prednisone taper from  current 20 mg dose if increased would need to step back.  Due to multiple comorbidities and high risk of deterioration with long-term steroid use believe he be a good candidate for starting on Actemra 162 mg [place] weekly to accelerate successful steroid taper.  High risk medication use - Plan: CBC with Differential/Platelet, COMPLETE METABOLIC PANEL WITH GFR, Hepatitis B core antibody, IgM, Hepatitis B surface antigen  Checking CBC CMP and baseline screening for hepatitis B and QuantiFERON.  Previous hepatitis C on file reviewed was negative.  Discussed risks of Actemra including injection reactions, cytopenias, hepatotoxicity, hyperlipidemia, infections, and malignancy with long-term treatment.  No personal history of diverticulitis.  No history of blood clots currently on anticoagulation for A-fib.  Chronic systolic congestive heart failure, NYHA class 1 (HCC) New onset atrial fibrillation (HCC)  Is 2+ pitting edema probably combination of decreased ejection fraction from his A-fib and probably some fluid retention on high-dose steroids.  Type 2 diabetes mellitus with other specified complication, without long-term current use of insulin (HCC)  Recent A1c  of 5.8% but anticipate likely to be worse controlled for now due to high dose prednisone.  Mixed hyperlipidemia  Has history of hyperlipidemia documented but recent results were great with total cholesterol less than 100. Would plan to recheck monitoring with Actemra new start.  Orders: Orders Placed This Encounter  Procedures   Sedimentation rate   C-reactive protein   CBC with Differential/Platelet   COMPLETE METABOLIC PANEL WITH GFR   Hepatitis B core antibody, IgM   Hepatitis B surface antigen   No orders of the defined types were placed in this encounter.    Follow-Up Instructions: Return in about 4 weeks (around 10/04/2022) for New pt [facility] GC/?Actemra.   Fuller Plan, MD  Note - This record has been created using  [employer].  Chart creation errors have been sought, but may not always  have been located. Such creation errors do not reflect on  the standard of medical care.

## 2022-09-07 ENCOUNTER — Ambulatory Visit (INDEPENDENT_AMBULATORY_CARE_PROVIDER_SITE_OTHER): Payer: Medicare Other | Admitting: Neurosurgery

## 2022-09-07 ENCOUNTER — Encounter: Payer: Self-pay | Admitting: Neurosurgery

## 2022-09-07 VITALS — BP 130/68 | Ht 68.0 in | Wt 150.8 lb

## 2022-09-07 DIAGNOSIS — I671 Cerebral aneurysm, nonruptured: Secondary | ICD-10-CM

## 2022-09-07 LAB — CBC WITH DIFFERENTIAL/PLATELET
Absolute Monocytes: 316 {cells}/uL (ref 200–950)
Basophils Absolute: 12 cells/uL (ref 0–200)
Basophils Relative: 0.2 %
Eosinophils Absolute: 12 {cells}/uL — ABNORMAL LOW (ref 15–500)
Eosinophils Relative: 0.2 %
HCT: 33.1 % — ABNORMAL LOW (ref 38.5–50.0)
Hemoglobin: 10.8 g/dL — ABNORMAL LOW (ref 13.2–17.1)
Lymphs Abs: 539 {cells}/uL — ABNORMAL LOW (ref 850–3900)
MCH: 31.9 pg (ref 27.0–33.0)
MCHC: 32.6 g/dL (ref 32.0–36.0)
MCV: 97.6 fL (ref 80.0–100.0)
Monocytes Relative: 5.1 %
Neutro Abs: 5320 {cells}/uL (ref 1500–7800)
Neutrophils Relative %: 85.8 %
Platelets: 72 10*3/uL — ABNORMAL LOW (ref 140–400)
RBC: 3.39 10*6/uL — ABNORMAL LOW (ref 4.20–5.80)
RDW: 16.3 % — ABNORMAL HIGH (ref 11.0–15.0)
Total Lymphocyte: 8.7 %
WBC: 6.2 10*3/uL (ref 3.8–10.8)

## 2022-09-07 LAB — COMPLETE METABOLIC PANEL WITH GFR
AG Ratio: 2.6 (calc) — ABNORMAL HIGH (ref 1.0–2.5)
ALT: 20 U/L (ref 9–46)
AST: 13 U/L (ref 10–35)
Albumin: 3.6 g/dL (ref 3.6–5.1)
Alkaline phosphatase (APISO): 44 U/L (ref 35–144)
BUN: 24 mg/dL (ref 7–25)
CO2: 29 mmol/L (ref 20–32)
Calcium: 8.4 mg/dL — ABNORMAL LOW (ref 8.6–10.3)
Chloride: 107 mmol/L (ref 98–110)
Creat: 1.05 mg/dL (ref 0.70–1.22)
Globulin: 1.4 g/dL — ABNORMAL LOW (ref 1.9–3.7)
Glucose, Bld: 152 mg/dL — ABNORMAL HIGH (ref 65–99)
Potassium: 4.3 mmol/L (ref 3.5–5.3)
Sodium: 142 mmol/L (ref 135–146)
Total Bilirubin: 1 mg/dL (ref 0.2–1.2)
Total Protein: 5 g/dL — ABNORMAL LOW (ref 6.1–8.1)
eGFR: 72 mL/min/{1.73_m2} (ref >=60)

## 2022-09-07 LAB — C-REACTIVE PROTEIN: CRP: 5.9 mg/L (ref <8.0)

## 2022-09-07 LAB — SEDIMENTATION RATE: Sed Rate: 2 mm/h (ref 0–20)

## 2022-09-07 LAB — HEPATITIS B CORE ANTIBODY, IGM: Hep B C IgM: NONREACTIVE

## 2022-09-07 LAB — HEPATITIS B SURFACE ANTIGEN: Hepatitis B Surface Ag: NONREACTIVE

## 2022-09-10 ENCOUNTER — Other Ambulatory Visit: Payer: Self-pay

## 2022-09-10 DIAGNOSIS — D649 Anemia, unspecified: Secondary | ICD-10-CM

## 2022-09-10 DIAGNOSIS — I4819 Other persistent atrial fibrillation: Secondary | ICD-10-CM

## 2022-09-10 DIAGNOSIS — Z79899 Other long term (current) drug therapy: Secondary | ICD-10-CM

## 2022-09-10 NOTE — Telephone Encounter (Signed)
Submitted a Prior Authorization request to [facility] for ACTEMRA SQ via CoverMyMeds. Will update once we receive a response.  Key: [id number]

## 2022-09-14 ENCOUNTER — Other Ambulatory Visit (HOSPITAL_COMMUNITY): Payer: Medicare Other

## 2022-09-14 LAB — BASIC METABOLIC PANEL
CO2: 22 mmol/L (ref 20–29)
Glucose: 115 mg/dL — ABNORMAL HIGH (ref 70–99)
Potassium: 4.7 mmol/L (ref 3.5–5.2)
eGFR: 66 mL/min/{1.73_m2} (ref >59)

## 2022-09-14 LAB — CBC

## 2022-09-20 ENCOUNTER — Other Ambulatory Visit (HOSPITAL_COMMUNITY): Payer: Self-pay

## 2022-09-20 ENCOUNTER — Ambulatory Visit (INDEPENDENT_AMBULATORY_CARE_PROVIDER_SITE_OTHER): Payer: Medicare Other | Admitting: Family Medicine

## 2022-09-20 VITALS — BP 120/82 | HR 81 | Temp 98.0°F | Ht 68.0 in | Wt 153.6 lb

## 2022-09-20 DIAGNOSIS — I5021 Acute systolic (congestive) heart failure: Secondary | ICD-10-CM

## 2022-09-20 DIAGNOSIS — Z794 Long term (current) use of insulin: Secondary | ICD-10-CM

## 2022-09-20 DIAGNOSIS — M316 Other giant cell arteritis: Secondary | ICD-10-CM

## 2022-09-20 DIAGNOSIS — E1169 Type 2 diabetes mellitus with other specified complication: Secondary | ICD-10-CM

## 2022-09-20 MED ORDER — APIXABAN 5 MG PO TABS: 5.0000 mg | ORAL_TABLET | Freq: Two times a day (BID) | ORAL | 5 refills | Status: DC

## 2022-09-20 MED ORDER — GLIPIZIDE ER 5 MG PO TB24: 5.0000 mg | ORAL_TABLET | Freq: Every day | ORAL | 3 refills | Status: DC

## 2022-09-20 MED ORDER — FUROSEMIDE 40 MG PO TABS: 40.0000 mg | ORAL_TABLET | Freq: Every day | ORAL | 3 refills | Status: DC

## 2022-09-20 NOTE — Telephone Encounter (Signed)
Received notification from [facility] regarding a prior authorization for ACTEMRA. Authorization has been APPROVED from 09/12/22 to 09/10/2023. Approval letter sent to scan center.  Per test claim, copay for 28 days supply is $1371.48  Authorization # [id number] Phone # [phone]  Will need to move forward with PAP. Provider portion placed in Dr. Gregary Cromer folder for signature.  Patient portion printed for patient. He is at another OV right now so will plan to call him tomorrow morning  Chesley Mires, PharmD, MPH, BCPS, CPP Clinical Pharmacist (Rheumatology and Pulmonology)

## 2022-09-20 NOTE — Progress Notes (Signed)
Subjective:    Patient ID: Donald Rios, male    DOB: 05-28-1940, 82 y.o.   MRN: [id number]   Admit date: 08/13/2022  Discharge date: 08/16/2022 Last week, the patient developed the sudden onset of blurry vision in his left eye.  He went to the ophthalmologist office and the ophthalmologist noticed inflammation of the optic disc.  He was concerned about temporal arteritis.  Due to his elevated sedimentation rate, the patient was admitted to the hospital and started on high-dose steroids.  He is currently taking 60 mg of prednisone daily.  He states that he has completely lost vision in his left eye.  Everything appears gray with occasional flashes of light.  He still has normal vision in his right eye.  He denies any headache.  He denies any jaw pain or claudication today although he states that his tongue does feel tired when he tries to eat.  He denies any tenderness over his temples.  He denies any pain in his shoulders or in his hips.  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  These are findings.  He has not seen neurology to discuss either of these 2 issues.  He is requesting that I schedule him a follow-up with neurology.  He also previously saw a rheumatology for PMR however we were successfully able to wean him off prednisone.  He would like to establish again with his rheumatologist.  Since starting the high-dose steroids, his blood sugars have been over 300.  He is currently only taking Januvia.  He denies any polyuria or polydipsia or abdominal pain today.  At that time, my plan was: Patient needs to see rheumatology and neurology as soon as possible.  Will plan on continuing prednisone 60 mg a day for at least the next 2 weeks.  I will see the patient back in 2 weeks and plan to taper him down to 50 mg if neurology and rheumatology are okay with that.  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  His sugars however are significantly elevated and over 300.  Given that he will be on prednisone for several months, I will add glipizide extended release 2.5 mg daily and recheck the patient in 2 weeks.  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  I believe right now the only need is to monitor these.  However I would appreciate neurology's input.  Patient is currently in atrial fibrillation today.  However he is appropriately rate controlled with metoprolol and appropriately anticoagulated with Eliquis so no further treatment is necessary for this.  Recheck in 2 weeks.  09/20/22 Patient has seen neurosurgery.  They feel that the aneurysms have less than 5% chance of rupture.  They recommended a repeat MR angiogram in 6 months and have scheduled follow-up.  He is also seeing rheumatology.  They are weaning him down off of his prednisone and he is to see Dr. Dimple Casey again around 9/23.  They have also discussed starting Acterma.  Echo 8/24: IMPRESSIONS  1. Left ventricular ejection fraction, by estimation, is 25 to 30%. The  left ventricle has severely decreased function. The left ventricle  demonstrates global hypokinesis.   2. Right ventricular systolic function is normal. The right ventricular  size is normal.   3. Left atrial size was moderately dilated. No left atrial/left  atrial  appendage thrombus was detected.   4. The mitral valve is normal in structure. Moderate to severe mitral  valve regurgitation. No evidence of mitral stenosis.   5. The aortic valve is tricuspid. Aortic valve regurgitation is not  visualized. No aortic stenosis is present.   6. The inferior vena cava is normal in size with greater than 50%  respiratory variability, suggesting right atrial pressure of 3 mmHg.   Patient is here today follow-up.  He has +1 to +2 pitting edema in both legs up to his mid shin.  He denies any cough, chest pain, pleurisy, or paroxysmal nocturnal  dyspnea.  He denies any orthopnea.  Today he is still in atrial fibrillation however his rate is controlled.  He is appropriately anticoagulated on Eliquis.  He states that his blood sugars are greater than 200 on average.  He is taking glipizide 2.5 mg daily.  However he independently dropped his prednisone to 20 mg a day from 60 mg a day.  This occurred 1 week ago.  He denies any worsening vision.  He denies any headache.  He denies any jaw pain or claudication with eating. 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    History of MI (myocardial infarction)    History of nuclear stress test 08/07/2010   dipyridamole; EKG negative for ischemia, low risk scan    Hyperlipidemia    Hyperplastic colon polyp 05/2008   Hypertension    Ischemic cardiomyopathy    EF 45%, with inferior wall motion abnormality    Myocardial infarction (HCC)    Valvular regurgitation    mitral and tricuspid (mild)   Past Surgical History:  Procedure Laterality Date   CARDIOVERSION N/A 08/16/2022   Procedure: CARDIOVERSION;  Surgeon: Jake Bathe, MD;  Location: [facility];  Service: Cardiovascular;  Laterality: N/A;   CORONARY ARTERY BYPASS GRAFT  2000   LIMA to LAD, free IMA to OM2, sequential graft to PLA & PLD   ESOPHAGOGASTRODUODENOSCOPY N/A 05/07/2013   Procedure: ESOPHAGOGASTRODUODENOSCOPY (EGD);  Surgeon: Iva Boop, MD;  Location[facility] ENDOSCOPY;  Service: Endoscopy;  Laterality: N/A;   TEE WITHOUT CARDIOVERSION N/A 08/16/2022   Procedure: TRANSESOPHAGEAL  ECHOCARDIOGRAM;  Surgeon: Jake Bathe, MD;  Location: [facility] INVAS[facility] CV LAB;  Service: Cardiovascular;  Laterality: N/A;   TRANSTHORACIC ECHOCARDIOGRAM  09/07/2010   EF 45-50%; LV systolic function mildly reduced; LA mildly dilated; mild-mod MR & mild-mod TR; aortic root sclerosis/calcification;    Current Outpatient Medications on File Prior to Visit  Medication Sig Dispense Refill   allopurinol (ZYLOPRIM) 300 MG tablet TAKE 1 TABLET BY MOUTH DAILY (Patient taking differently: Take 300 mg by mouth at bedtime.) 90 tablet 1   apixaban (ELIQUIS) 5 MG TABS tablet Take 1 tablet (5 mg total) by mouth 2 (two) times daily. 60 tablet 0   Blood Glucose Monitoring Suppl (BLOOD GLUCOSE MONITOR SYSTEM) w/Device KIT 1 each by Does not apply route  in the morning, at noon, and at bedtime. 1 kit 0   doxazosin (CARDURA) 2 MG tablet TAKE 1 TABLET BY MOUTH DAILY (Patient taking differently: Take 2 mg by mouth at bedtime.) 90 tablet 3   empagliflozin (JARDIANCE) 10 MG TABS tablet Take 1 tablet (10 mg total) by mouth daily. 30 tablet 2   furosemide (LASIX) 20 MG tablet Take 1 tablet (20 mg total) by mouth daily. 30 tablet 3   glipiZIDE (GLIPIZIDE XL) 2.5 MG 24 hr tablet Take 1 tablet (2.5 mg total) by mouth daily with breakfast. 30 tablet 1   Glucose Blood (BLOOD GLUCOSE TEST STRIPS) STRP Use in the morning, at noon, and at bedtime. May substitute to any manufacturer covered by patient's insurance. 100 strip 2   insulin aspart (FIASP FLEXTOUCH) 100 UNIT/ML FlexTouch Pen Before each meal 3 times a day, 140-199 - 2 units, 200-250 - 4 units, 251-299 - 6 units,  300-349 - 8 units,  350 or above 10 units. 9 mL 0   Insulin Pen Needle 32G X 4 MM MISC For 4 times a day insulin injection under the skin, 1 month supply. 100 each 0   losartan (COZAAR) 25 MG tablet Take 1 tablet (25 mg total) by mouth daily. 90 tablet 1   metoprolol tartrate (LOPRESSOR) 50 MG tablet TAKE 1 AND 1/2 TABLETS BY MOUTH TWO TIMES A DAY 90 tablet 0    mirtazapine (REMERON) 30 MG tablet TAKE 1 TABLET BY MOUTH EVERY NIGHT AT BEDTIME FOR APPETITE 90 tablet 1   pantoprazole (PROTONIX) 40 MG tablet Take 1 tablet (40 mg total) by mouth daily. 30 tablet 3   predniSONE (DELTASONE) 20 MG tablet Take 3 tablets (60 mg total) by mouth daily with breakfast. 100 tablet 1   simvastatin (ZOCOR) 40 MG tablet TAKE ONE TABLET BY MOUTH DAILY (Patient taking differently: Take 40 mg by mouth at bedtime.) 90 tablet 3   spironolactone (ALDACTONE) 25 MG tablet Take 0.5 tablets (12.5 mg total) by mouth daily. 90 tablet 3   No current facility-administered medications on file prior to visit.     No Known Allergies Social History   Socioeconomic History   Marital status: Married    Spouse name: Not on file   Number of children: 1   Years of education: Not on file   Highest education level: Not on file  Occupational History   Not on file  Tobacco Use   Smoking status: Former    Current packs/day: 0.00    Types: Cigarettes    Quit date: 11/04/1985    Years since quitting: 36.9   Smokeless tobacco: Never  Vaping Use   Vaping status: Never Used  Substance and Sexual Activity   Alcohol use: Not Currently    Alcohol/week: 2.0 - 3.0 standard drinks of alcohol    Types: 2 - 3 Cans of beer per week   Drug use: No   Sexual activity: Not on file  Other Topics Concern   Not on file  Social History Narrative   Not on file   Social Determinants of Health   Financial Resource Strain: Low Risk  (08/24/2022)   Overall Financial Resource Strain (CARDIA)    Difficulty of Paying Living Expenses: Not hard at all  Food Insecurity: No Food Insecurity (08/24/2022)   Hunger Vital Sign    Worried About Running Out of Food in the Last Year: Never true    Ran Out of Food in the Last Year: Never true  Transportation  Needs: No Transportation Needs (08/24/2022)   PRAPARE - Administrator, Civil Service (Medical): No    Lack of Transportation (Non-Medical): No   Physical Activity: Sufficiently Active (07/10/2021)   Exercise Vital Sign    Days of Exercise per Week: 5 days    Minutes of Exercise per Session: 30 min  Stress: No Stress Concern Present (08/24/2022)   [employer] of Occupational Health - Occupational Stress Questionnaire    Feeling of Stress : Only a little  Social Connections: Socially Integrated (07/22/2022)   Social Connection and Isolation Panel [NHANES]    Frequency of Communication with Friends and Family: More than three times a week    Frequency of Social Gatherings with Friends and Family: More than three times a week    Attends Religious Services: More than 4 times per year    Active Member of [employer] or Organizations: Yes    Attends Banker Meetings: More than 4 times per year    Marital Status: Married  Catering manager Violence: Not At Risk (08/13/2022)   Humiliation, Afraid, Rape, and Kick questionnaire    Fear of Current or Ex-Partner: No    Emotionally Abused: No    Physically Abused: No    Sexually Abused: No     Review of Systems  All other systems reviewed and are negative.      Objective:   Physical Exam Vitals reviewed.  Constitutional:      Appearance: He is well-developed.  Neck:     Thyroid: No thyromegaly.     Vascular: No JVD.  Cardiovascular:     Rate and Rhythm: Normal rate. Rhythm irregular.     Heart sounds: Normal heart sounds. No murmur heard. Pulmonary:     Effort: Pulmonary effort is normal. No respiratory distress.     Breath sounds: Normal breath sounds. No wheezing or rales.  Chest:     Chest wall: No tenderness.  Abdominal:     General: Bowel sounds are normal. There is no distension.     Palpations: Abdomen is soft. There is no mass.     Tenderness: There is no abdominal tenderness. There is no guarding or rebound.  Musculoskeletal:     Cervical back: Neck supple.     Right lower leg: Edema present.     Left lower leg: Edema present.  Lymphadenopathy:      Cervical: No cervical adenopathy.  Skin:    Findings: No rash.           Assessment & Plan:  Acute systolic congestive heart failure (HCC) - Plan: COMPLETE METABOLIC PANEL WITH GFR  Temporal arteritis (HCC)  Type 2 diabetes mellitus with other specified complication, without long-term current use of insulin (HCC) I spent more than 30 minutes today with the patient explaining everything is going on.  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  I have asked him to increase to 40 mg a day and continue this dose until seen by his rheumatologist in 2 weeks.  I would defer to rheumatology's decision regarding tapering off prednisone.  Ongoing studies October 5 and he is having to use sliding scale insulin to compensate.  We will increase glipizide extended release to 5 mg a day to compensate for this.  Third regarding his atrial fibrillation, he is rate controlled with metoprolol.  I have recommended that he maintain Eliquis 5 mg daily indefinitely  for primary prevention of stroke.  I explained this to him in detail.  Fourth regarding his congestive heart failure, he is currently on metoprolol, losartan, spironolactone, and Jardiance.  In the future we may transition losartan to [facility].  However, cost is an issue right now.  Therefore I will increase his Lasix to 40 mg a day and recheck his renal function and potassium in 1 week.

## 2022-09-21 LAB — COMPLETE METABOLIC PANEL WITH GFR
AG Ratio: 2.6 (calc) — ABNORMAL HIGH (ref 1.0–2.5)
ALT: 21 U/L (ref 9–46)
AST: 12 U/L (ref 10–35)
Albumin: 4.1 g/dL (ref 3.6–5.1)
Alkaline phosphatase (APISO): 47 U/L (ref 35–144)
BUN: 15 mg/dL (ref 7–25)
CO2: 27 mmol/L (ref 20–32)
Calcium: 9.3 mg/dL (ref 8.6–10.3)
Chloride: 107 mmol/L (ref 98–110)
Creat: 1.01 mg/dL (ref 0.70–1.22)
Globulin: 1.6 g/dL — ABNORMAL LOW (ref 1.9–3.7)
Glucose, Bld: 79 mg/dL (ref 65–99)
Potassium: 3.5 mmol/L (ref 3.5–5.3)
Sodium: 144 mmol/L (ref 135–146)
Total Bilirubin: 1.1 mg/dL (ref 0.2–1.2)
Total Protein: 5.7 g/dL — ABNORMAL LOW (ref 6.1–8.1)
eGFR: 75 mL/min/{1.73_m2} (ref >=60)

## 2022-09-21 NOTE — Telephone Encounter (Signed)
ATC patient regarding Actemra PAP. Unable to reach in home phone or mobile. Left VM on mobile phone number. Myhart message sent  Chesley Mires, PharmD, MPH, BCPS, CPP Clinical Pharmacist (Rheumatology and Pulmonology)

## 2022-09-23 ENCOUNTER — Ambulatory Visit: Payer: Self-pay | Admitting: *Deleted

## 2022-09-23 NOTE — Patient Outreach (Addendum)
  Care Coordination   Follow Up Visit Note   04/15/2023 updated note for 09/23/22 Name: Donald Rios MRN: [id number] DOB: 04-01-1940  Donald Rios is a 82 y.o. year old male who sees Pickard, Priscille Heidelberg, MD for primary care. I spoke with wife [name], for [facility] by phone today as patient is at social event at today.  What matters to the patients health and wellness today?  Shoes for feet, feet with swelling/congestive heart disease, back of neck pain, diabetes, atrial fibrillation     Wife confirms patient remains non compliant with use of salt  None of his shoes fit his feet as they are swollen  Diabetes taking cbg values, He does share values with wife She voices that he has not complained about it being elevated Managing well HgA1c <7  Atrial fibrillation- voiced no concerns Wife to encourage patient to decrease his use of salt and remind him of the risks of congestive Heart Failure (CHF). Chronic Kidney disease (CKD)  Back of head/neck pain recent issues He sits and puts his head back to decrease the pain. Denies voiced concern of headaches with this pain     Goals Addressed             This Visit's Progress    patient will be able to manage CHF, atrial fibrillation better at home- [facility] nurse care coordination services       Interventions Today    Flowsheet Row Most Recent Value  Chronic Disease   Chronic disease during today's visit Diabetes, Congestive Heart Failure (CHF), Other  [shoes for swollen feet, back of neck pain]  General Interventions   General Interventions Discussed/Reviewed General Interventions Reviewed, Doctor Visits, Durable Medical Equipment (DME)  Doctor Visits Discussed/Reviewed Doctor Visits Reviewed, PCP, Specialist  Durable Medical Equipment (DME) Glucomoter, BP Cuff  PCP/Specialist Visits Compliance with follow-up visit  Exercise Interventions   Exercise Discussed/Reviewed Exercise Reviewed, Physical Activity, Weight Managment, Assistive  device use and maintanence  Physical Activity Discussed/Reviewed Physical Activity Reviewed, Types of exercise  Weight Management Weight maintenance  [ecouraged weight monitoring as part of CHF actin plan]  Education Interventions   Education Provided Provided Education  [risks related to non compliance with heart failure action plan]  Provided Verbal Education On Sick Day Rules, When to see the doctor, Exercise, Nutrition  Mental Health Interventions   Mental Health Discussed/Reviewed Mental Health Reviewed, Coping Strategies  Nutrition Interventions   Nutrition Discussed/Reviewed Nutrition Reviewed, Decreasing salt, Fluid intake  Pharmacy Interventions   Pharmacy Dicussed/Reviewed Pharmacy Topics Reviewed, Affording Medications  Advanced Directive Interventions   Advanced Directives Discussed/Reviewed Advanced Directives Discussed  [encouraged]              SDOH assessments and interventions completed:  No     Care Coordination Interventions:  Yes, provided   Follow up plan:  as needed    Encounter Outcome:  Patient Visit Completed   Jalon Blackwelder L. Noelle Penner, RN, BSN, CCM, Care Management Coordinator [phone]

## 2022-09-24 ENCOUNTER — Other Ambulatory Visit: Payer: Self-pay

## 2022-09-24 DIAGNOSIS — D649 Anemia, unspecified: Secondary | ICD-10-CM

## 2022-09-24 DIAGNOSIS — I4819 Other persistent atrial fibrillation: Secondary | ICD-10-CM

## 2022-09-24 DIAGNOSIS — Z79899 Other long term (current) drug therapy: Secondary | ICD-10-CM | POA: Diagnosis not present

## 2022-09-24 LAB — CBC
Hematocrit: 39.9 % (ref 37.5–51.0)
Hemoglobin: 12.7 g/dL — ABNORMAL LOW (ref 13.0–17.7)
MCH: 32.8 pg (ref 26.6–33.0)
MCHC: 31.8 g/dL (ref 31.5–35.7)
MCV: 103 fL — ABNORMAL HIGH (ref 79–97)
NRBC: 1 % — ABNORMAL HIGH (ref 0–0)
Platelets: 128 10*3/uL — ABNORMAL LOW (ref 150–450)
RBC: 3.87 x10E6/uL — ABNORMAL LOW (ref 4.14–5.80)
RDW: 16.1 % — ABNORMAL HIGH (ref 11.6–15.4)
WBC: 8.3 10*3/uL (ref 3.4–10.8)

## 2022-09-27 ENCOUNTER — Ambulatory Visit (INDEPENDENT_AMBULATORY_CARE_PROVIDER_SITE_OTHER): Payer: Medicare Other | Admitting: Family Medicine

## 2022-09-27 ENCOUNTER — Encounter: Payer: Self-pay | Admitting: Family Medicine

## 2022-09-27 VITALS — BP 120/74 | HR 81 | Temp 97.8°F | Ht 68.0 in | Wt 146.8 lb

## 2022-09-27 DIAGNOSIS — I5021 Acute systolic (congestive) heart failure: Secondary | ICD-10-CM

## 2022-09-27 LAB — BASIC METABOLIC PANEL WITH GFR
BUN: 24 mg/dL (ref 7–25)
CO2: 35 mmol/L — ABNORMAL HIGH (ref 20–32)
Calcium: 9.5 mg/dL (ref 8.6–10.3)
Chloride: 101 mmol/L (ref 98–110)
Creat: 1.14 mg/dL (ref 0.70–1.22)
Glucose, Bld: 171 mg/dL — ABNORMAL HIGH (ref 65–99)
Potassium: 3.6 mmol/L (ref 3.5–5.3)
Sodium: 143 mmol/L (ref 135–146)
eGFR: 65 mL/min/{1.73_m2} (ref >=60)

## 2022-09-27 NOTE — Progress Notes (Signed)
Subjective:    Patient ID: Donald Rios, male    DOB: 01/21/1940, 82 y.o.   MRN: [id number]   Admit date: 08/13/2022  Discharge date: 08/16/2022 Last week, the patient developed the sudden onset of blurry vision in his left eye.  He went to the ophthalmologist office and the ophthalmologist noticed inflammation of the optic disc.  He was concerned about temporal arteritis.  Due to his elevated sedimentation rate, the patient was admitted to the hospital and started on high-dose steroids.  He is currently taking 60 mg of prednisone daily.  He states that he has completely lost vision in his left eye.  Everything appears gray with occasional flashes of light.  He still has normal vision in his right eye.  He denies any headache.  He denies any jaw pain or claudication today although he states that his tongue does feel tired when he tries to eat.  He denies any tenderness over his temples.  He denies any pain in his shoulders or in his hips.  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  These are findings.  He has not seen neurology to discuss either of these 2 issues.  He is requesting that I schedule him a follow-up with neurology.  He also previously saw a rheumatology for PMR however we were successfully able to wean him off prednisone.  He would like to establish again with his rheumatologist.  Since starting the high-dose steroids, his blood sugars have been over 300.  He is currently only taking Januvia.  He denies any polyuria or polydipsia or abdominal pain today.  At that time, my plan was: Patient needs to see rheumatology and neurology as soon as possible.  Will plan on continuing prednisone 60 mg a day for at least the next 2 weeks.  I will see the patient back in 2 weeks and plan to taper him down to 50 mg if neurology and rheumatology are okay with that.  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  His sugars however are significantly elevated and over 300.  Given that he will be on prednisone for several months, I will add glipizide extended release 2.5 mg daily and recheck the patient in 2 weeks.  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  I believe right now the only need is to monitor these.  However I would appreciate neurology's input.  Patient is currently in atrial fibrillation today.  However he is appropriately rate controlled with metoprolol and appropriately anticoagulated with Eliquis so no further treatment is necessary for this.  Recheck in 2 weeks.  09/20/22 Patient has seen neurosurgery.  They feel that the aneurysms have less than 5% chance of rupture.  They recommended a repeat MR angiogram in 6 months and have scheduled follow-up.  He is also seeing rheumatology.  They are weaning him down off of his prednisone and he is to see Dr. Dimple Casey again around 9/23.  They have also discussed starting Acterma.  Echo 8/24: IMPRESSIONS  1. Left ventricular ejection fraction, by estimation, is 25 to 30%. The  left ventricle has severely decreased function. The left ventricle  demonstrates global hypokinesis.   2. Right ventricular systolic function is normal. The right ventricular  size is normal.   3. Left atrial size was moderately dilated. No left atrial/left  atrial  appendage thrombus was detected.   4. The mitral valve is normal in structure. Moderate to severe mitral  valve regurgitation. No evidence of mitral stenosis.   5. The aortic valve is tricuspid. Aortic valve regurgitation is not  visualized. No aortic stenosis is present.   6. The inferior vena cava is normal in size with greater than 50%  respiratory variability, suggesting right atrial pressure of 3 mmHg.   Patient is here today follow-up.  He has +1 to +2 pitting edema in both legs up to his mid shin.  He denies any cough, chest pain, pleurisy, or paroxysmal nocturnal  dyspnea.  He denies any orthopnea.  Today he is still in atrial fibrillation however his rate is controlled.  He is appropriately anticoagulated on Eliquis.  He states that his blood sugars are greater than 200 on average.  He is taking glipizide 2.5 mg daily.  However he independently dropped his prednisone to 20 mg a day from 60 mg a day.  This occurred 1 week ago.  He denies any worsening vision.  He denies any headache.  He denies any jaw pain or claudication with eating.  At that time, my plan was: I spent more than 30 minutes today with the patient explaining everything is going on.  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  I have asked him to increase to 40 mg a day and continue this dose until seen by his rheumatologist in 2 weeks.  I would defer to rheumatology's decision regarding tapering off prednisone.  Ongoing studies October 5 and he is having to use sliding scale insulin to compensate.  We will increase glipizide extended release to 5 mg a day to compensate for this.  Third regarding his atrial fibrillation, he is rate controlled with metoprolol.  I have recommended that he maintain Eliquis 5 mg daily indefinitely for primary prevention of stroke.  I explained this to him in detail.  Fourth regarding his congestive heart failure, he is currently on metoprolol, losartan, spironolactone, and Jardiance.  In the future we may transition losartan to [facility].  However, cost is an issue right now.  Therefore I will increase his Lasix to 40 mg a day and recheck his renal function and potassium in 1 week.  09/27/22 Since starting Lasix 40 mg a day, the patient has diuresed 7 pounds.  The swelling in his leg has improved dramatically.  He still has +1 pitting edema in both ankles but the swelling subsides higher than his ankles.  He denies any lightheadedness.  He denies any shortness of breath. 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    History of MI (myocardial infarction)    History of nuclear stress test 08/07/2010   dipyridamole; EKG negative for ischemia, low risk scan    Hyperlipidemia    Hyperplastic colon polyp 05/2008  Hypertension    Ischemic cardiomyopathy    EF 45%, with inferior wall motion abnormality    Myocardial infarction (HCC)    Valvular regurgitation    mitral and tricuspid (mild)   Past Surgical History:  Procedure Laterality Date   CARDIOVERSION N/A 08/16/2022   Procedure: CARDIOVERSION;  Surgeon: Jake Bathe, MD;  Location: [facility] INVASIVE CV LAB;  Service: Cardiovascular;  Laterality: N/A;   CORONARY ARTERY BYPASS GRAFT  2000   LIMA to LAD, free IMA to OM2, sequential graft to PLA & PLD   ESOPHAGOGASTRODUODENOSCOPY N/A 05/07/2013   Procedure: ESOPHAGOGASTRODUODENOSCOPY (EGD);  Surgeon: Iva Boop, MD;  Location: [facility] ENDOSCOPY;  Service: Endoscopy;  Laterality: N/A;   TEE WITHOUT CARDIOVERSION N/A 08/16/2022   Procedure: TRANSESOPHAGEAL ECHOCARDIOGRAM;  Surgeon: Jake Bathe, MD;  Location: [facility] INVASIVE CV LAB;  Service: Cardiovascular;  Laterality: N/A;   TRANSTHORACIC ECHOCARDIOGRAM  09/07/2010   EF 45-50%; LV systolic function mildly reduced; LA mildly dilated; mild-mod MR & mild-mod TR; aortic root sclerosis/calcification;    Current Outpatient Medications on File Prior to Visit  Medication Sig Dispense Refill   allopurinol (ZYLOPRIM) 300 MG tablet TAKE 1 TABLET BY MOUTH DAILY (Patient taking differently: Take 300 mg by mouth at bedtime.) 90 tablet  1   apixaban (ELIQUIS) 5 MG TABS tablet Take 1 tablet (5 mg total) by mouth 2 (two) times daily. 60 tablet 5   Blood Glucose Monitoring Suppl (BLOOD GLUCOSE MONITOR SYSTEM) w/Device KIT 1 each by Does not apply route in the morning, at noon, and at bedtime. 1 kit 0   doxazosin (CARDURA) 2 MG tablet TAKE 1 TABLET BY MOUTH DAILY (Patient taking differently: Take 2 mg by mouth at bedtime.) 90 tablet 3   empagliflozin (JARDIANCE) 10 MG TABS tablet Take 1 tablet (10 mg total) by mouth daily. 30 tablet 2   furosemide (LASIX) 20 MG tablet Take 1 tablet (20 mg total) by mouth daily. 30 tablet 3   furosemide (LASIX) 40 MG tablet Take 1 tablet (40 mg total) by mouth daily. 30 tablet 3   glipiZIDE (GLIPIZIDE XL) 5 MG 24 hr tablet Take 1 tablet (5 mg total) by mouth daily with breakfast. 30 tablet 3   Glucose Blood (BLOOD GLUCOSE TEST STRIPS) STRP Use in the morning, at noon, and at bedtime. May substitute to any manufacturer covered by patient's insurance. 100 strip 2   insulin aspart (FIASP FLEXTOUCH) 100 UNIT/ML FlexTouch Pen Before each meal 3 times a day, 140-199 - 2 units, 200-250 - 4 units, 251-299 - 6 units,  300-349 - 8 units,  350 or above 10 units. 9 mL 0   Insulin Pen Needle 32G X 4 MM MISC For 4 times a day insulin injection under the skin, 1 month supply. 100 each 0   losartan (COZAAR) 25 MG tablet Take 1 tablet (25 mg total) by mouth daily. 90 tablet 1   metoprolol tartrate (LOPRESSOR) 50 MG tablet TAKE 1 AND 1/2 TABLETS BY MOUTH TWO TIMES A DAY 90 tablet 0   mirtazapine (REMERON) 30 MG tablet TAKE 1 TABLET BY MOUTH EVERY NIGHT AT BEDTIME FOR APPETITE 90 tablet 1   pantoprazole (PROTONIX) 40 MG tablet Take 1 tablet (40 mg total) by mouth daily. 30 tablet 3   predniSONE (DELTASONE) 20 MG tablet Take 3 tablets (60 mg total) by mouth daily with breakfast. 100 tablet 1   simvastatin (ZOCOR) 40 MG tablet TAKE ONE TABLET BY MOUTH DAILY (Patient taking differently: Take 40 mg by  mouth at bedtime.) 90  tablet 3   spironolactone (ALDACTONE) 25 MG tablet Take 0.5 tablets (12.5 mg total) by mouth daily. 90 tablet 3   No current facility-administered medications on file prior to visit.     No Known Allergies Social History   Socioeconomic History   Marital status: Married    Spouse name: Not on file   Number of children: 1   Years of education: Not on file   Highest education level: Not on file  Occupational History   Not on file  Tobacco Use   Smoking status: Former    Current packs/day: 0.00    Types: Cigarettes    Quit date: 11/04/1985    Years since quitting: 36.9   Smokeless tobacco: Never  Vaping Use   Vaping status: Never Used  Substance and Sexual Activity   Alcohol use: Not Currently    Alcohol/week: 2.0 - 3.0 standard drinks of alcohol    Types: 2 - 3 Cans of beer per week   Drug use: No   Sexual activity: Not on file  Other Topics Concern   Not on file  Social History Narrative   Not on file   Social Determinants of Health   Financial Resource Strain: Low Risk  (08/24/2022)   Overall Financial Resource Strain (CARDIA)    Difficulty of Paying Living Expenses: Not hard at all  Food Insecurity: No Food Insecurity (08/24/2022)   Hunger Vital Sign    Worried About Running Out of Food in the Last Year: Never true    Ran Out of Food in the Last Year: Never true  Transportation Needs: No Transportation Needs (08/24/2022)   PRAPARE - Administrator, Civil Service (Medical): No    Lack of Transportation (Non-Medical): No  Physical Activity: Sufficiently Active (07/10/2021)   Exercise Vital Sign    Days of Exercise per Week: 5 days    Minutes of Exercise per Session: 30 min  Stress: No Stress Concern Present (08/24/2022)   [employer] of Occupational Health - Occupational Stress Questionnaire    Feeling of Stress : Only a little  Social Connections: Socially Integrated (07/22/2022)   Social Connection and Isolation Panel [NHANES]    Frequency of  Communication with Friends and Family: More than three times a week    Frequency of Social Gatherings with Friends and Family: More than three times a week    Attends Religious Services: More than 4 times per year    Active Member of [employer] or Organizations: Yes    Attends Banker Meetings: More than 4 times per year    Marital Status: Married  Catering manager Violence: Not At Risk (08/13/2022)   Humiliation, Afraid, Rape, and Kick questionnaire    Fear of Current or Ex-Partner: No    Emotionally Abused: No    Physically Abused: No    Sexually Abused: No     Review of Systems  All other systems reviewed and are negative.      Objective:   Physical Exam Vitals reviewed.  Constitutional:      Appearance: He is well-developed.  Neck:     Thyroid: No thyromegaly.     Vascular: No JVD.  Cardiovascular:     Rate and Rhythm: Normal rate. Rhythm irregular.     Heart sounds: Normal heart sounds. No murmur heard. Pulmonary:     Effort: Pulmonary effort is normal. No respiratory distress.     Breath sounds: Normal breath sounds. No wheezing  or rales.  Chest:     Chest wall: No tenderness.  Abdominal:     General: Bowel sounds are normal. There is no distension.     Palpations: Abdomen is soft. There is no mass.     Tenderness: There is no abdominal tenderness. There is no guarding or rebound.  Musculoskeletal:     Cervical back: Neck supple.     Right lower leg: Edema present.     Left lower leg: Edema present.  Lymphadenopathy:     Cervical: No cervical adenopathy.  Skin:    Findings: No rash.           Assessment & Plan:  Acute systolic congestive heart failure (HCC) - Plan: BASIC METABOLIC PANEL WITH GFR Peripheral edema is much improved.  Check a BMP.  If creatinine and potassium are stable I will make no changes in his medication at this time.  If his creatinine has worsened, we will need to reduce his Lasix back to 20 mg a day

## 2022-09-28 NOTE — Telephone Encounter (Addendum)
Was able to get patient on the phone. We will email the Actemra PAP application to his address at [email or web address] He was also able to confirm his household income and size.   Sofie Rower, PharmD [employer] PGY1

## 2022-10-05 ENCOUNTER — Encounter: Payer: Self-pay | Admitting: Internal Medicine

## 2022-10-05 ENCOUNTER — Ambulatory Visit: Payer: Medicare Other | Attending: Internal Medicine | Admitting: Internal Medicine

## 2022-10-05 VITALS — BP 115/67 | HR 94 | Resp 14 | Ht 68.0 in | Wt 154.0 lb

## 2022-10-05 DIAGNOSIS — M316 Other giant cell arteritis: Secondary | ICD-10-CM | POA: Diagnosis not present

## 2022-10-05 MED ORDER — PREDNISONE 10 MG PO TABS
ORAL_TABLET | ORAL | 0 refills | Status: AC
Start: 2022-10-05 — End: 2022-11-09

## 2022-10-05 NOTE — Patient Instructions (Signed)
You can start tapering down the prednisone dose gradually starting from tomorrow:  Decrease down to 35 mg daily for 1 week Then decrease to 30 mg daily for 1 week Then decrease to 25 mg daily for 1 week Then decrease to 20 mg daily for 2 weeks or until we follow-up

## 2022-10-05 NOTE — Progress Notes (Unsigned)
Office Visit Note  Patient: Donald Rios             Date of Birth: 04-Dec-1940           MRN: [id number]             PCP: [name], MD Referring: [name], MD Visit Date: 10/05/2022   Subjective:  Follow-up   History of Present Illness: Donald Rios is a 82 y.o. 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  Lab results at previous visit looked fine with normal serum inflammatory markers.  He had recent follow-up with Dr. Tanya Nones labs at that time showing improvement in platelets up to 128.  He still has extensive lower extremity edema.  He was needing paperwork for Actemra application today so had not progressed PA yet.  Previous HPI 09/06/22 Donald Rios is a 82 y.o. 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  He has a medical history significant for coronary artery disease status post CABG, ischemic cardiomyopathy, type 2 diabetes, gout, and PMR.  Symptoms started at the beginning of this month with a sudden onset of left eye floaters and blurriness in his vision initially in the lower part of the field and then across his entire eye.  This progressively worsened over 1 to 2 days he saw his ophthalmologist for this found to have disc edema with highly elevated sedimentation rate and CRP and sent to the emergency department for evaluation suspected giant cell arteritis.  On admission he was also found to be in A-fib with RVR which is new for him.  After the onset of vision changes he also developed jaw pain this was present mildly at rest and worsened with chewing.  Did not experience any recurrence of previous shoulder or hip pain or stiffness.  In hospital he was found to have elevated serum inflammatory markers ultrasound examination of the temporal arteries indicated a positive halo sign.  He underwent MR angiography of the head and neck  that showed extensive arterial disease and microvascular disease throughout without any definite large vessel arteritis.  He was initially treated with high-dose intravenous steroids then transition to prednisone 60 mg daily.  Had improvement of symptoms jaw pain completely resolved after about 5 days in total.  Unfortunately left eye vision did not significantly improve has some light sensitivity and can see specks but no visual acuity. Right eye has some cloudiness in his vision this has been present since before these recent events and not changed since they started. He began gradual prednisone taper has decreased down to 20 mg daily at this time has not noticed any particular change or return of symptoms with decrease in the dose.  Does have increase in appetite, increase leg swelling, and increased blood sugars since starting the high-dose prednisone. He underwent TEE with direct-current cardioversion on August 5 unfortunately was reverted back into A-fib by time of cardiology follow-up 1 week later.  Findings also indicated decrease in his previous ejection fraction now indicating 30% with moderate to severe mitral regurgitation. He has a previous history of polymyalgia rheumatica was diagnosed and managed with Dr. Dierdre Forth at [facility] rheumatology from patient's report started in 2018.  This was characterized by fairly typical sounding shoulder and hip joint pain and stiffness and was treated with a long course of oral steroids.  He had been  off any maintenance treatment for years and is no longer actively followed with their office.     Labs reviewed 08/12/2022 ESR 51 CRP 56.3 Lipids HDL 34 else WNL   08/2020 HCV neg   Imaging reviewed 08/19/22 BT Abdomen Pelvis W Contrast IMPRESSION: Colonic diverticulosis, without radiographic evidence of diverticulitis or other acute findings.   Cholelithiasis. No radiographic evidence of cholecystitis.   Small left pleural effusion.     08/14/22  Temporal Artery [place] Technically difficult and limited study, see indications above. No compression of bilateral temporal arteries at proximal, mid, or distal  segments. Abnormal flow noted throughout. Summary:  Presence of a "halo" sign in the bilateral temporal artery suggests temporal arteritis.      08/13/22 MRI/MRA Head/Neck IMPRESSION: MRI HEAD IMPRESSION:   1. No acute intracranial abnormality. 2. 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 3. Innumerable punctate chronic micro hemorrhages involving both cerebral hemispheres. Overall pattern is nonspecific, with differential considerations including sequelae of prior cardiac bypass, chronic hypertension, cerebral amyloid angiopathy, prior trauma, or possibly vasculitis.   MRA HEAD IMPRESSION:   1. Negative intracranial MRA for large vessel occlusion. No hemodynamically significant or correctable stenosis. 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 5. 3 mm supraclinoid left ICA aneurysm.   MRA NECK IMPRESSION:   1. No acute vascular abnormality within the neck. 2. Mild atheromatous irregularity about both carotid artery systems within the neck without hemodynamically significant greater than 50% stenosis. 3. Patent vertebral arteries within the neck. Right vertebral artery dominant.   Review of Systems  Constitutional:  Negative for fatigue.  HENT:  Positive for mouth dryness. Negative for mouth sores.   Eyes:  Negative for dryness.  Respiratory:  Negative for shortness of breath.   Cardiovascular:  Positive for palpitations. Negative for chest pain.  Gastrointestinal:  Negative for blood in stool, constipation and diarrhea.  Endocrine: Positive for increased urination.  Genitourinary:  Negative for involuntary urination.  Musculoskeletal:  Positive for gait problem and joint swelling.  Negative for joint pain, joint pain, myalgias, muscle weakness, morning stiffness, muscle tenderness and myalgias.  Skin:  Negative for color change, rash, hair loss and sensitivity to sunlight.  Allergic/Immunologic: Negative for susceptible to infections.  Neurological:  Negative for dizziness and headaches.  Hematological:  Negative for swollen glands.  Psychiatric/Behavioral:  Negative for depressed mood and sleep disturbance. The patient is not nervous/anxious.     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 08/30/2017   Cardiomyopathy, ischemic 12/23/2015   Chronic systolic congestive heart failure, NYHA class 1 (HCC) 12/23/2015   Acute gastric ulcer with hemorrhage 05/06/2013   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    Mild tricuspid regurgitation    Mitral regurgitation    Atrial enlargement, left    Hearing loss    Diverticulosis    Hiatal hernia    Gout    DM2 (diabetes mellitus, type 2) (HCC)    GERD (gastroesophageal reflux disease)    Mixed hyperlipidemia    Essential hypertension     Past Medical History:  Diagnosis Date   Acute gastric ulcer with hemorrhage 05/06/2013   Arthritis    Asthma    Atrial enlargement, left    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    History of MI (myocardial infarction)    History of nuclear stress  test 08/07/2010   dipyridamole; EKG negative for ischemia, low risk scan    Hyperlipidemia    Hyperplastic colon polyp 05/2008   Hypertension    Ischemic cardiomyopathy    EF 45%, with inferior wall motion abnormality    Myocardial infarction (HCC)    Valvular regurgitation    mitral and tricuspid (mild)    Family History  Problem Relation Age of Onset   Stroke Father    Hypertension Father    Diabetes Brother    Esophageal cancer Brother    Stroke Maternal Grandmother    Stroke Maternal Grandfather    Stomach cancer Maternal Aunt    Colon cancer Neg Hx    Rectal cancer Neg Hx    Past Surgical History:  Procedure Laterality Date   CARDIOVERSION N/A 08/16/2022   Procedure: CARDIOVERSION;  Surgeon: Jake Bathe, MD;  Location: [facility] INVASIVE CV LAB;  Service: Cardiovascular;  Laterality: N/A;   CORONARY ARTERY BYPASS GRAFT  2000   LIMA to LAD, free IMA to OM2, sequential graft to PLA & PLD   ESOPHAGOGASTRODUODENOSCOPY N/A 05/07/2013   Procedure: ESOPHAGOGASTRODUODENOSCOPY (EGD);  Surgeon: Iva Boop, MD;  Location: [facility] ENDOSCOPY;  Service: Endoscopy;  Laterality: N/A;   TEE WITHOUT CARDIOVERSION N/A 08/16/2022   Procedure: TRANSESOPHAGEAL ECHOCARDIOGRAM;  Surgeon: Jake Bathe, MD;  Location: [facility] INVASIVE CV LAB;  Service: Cardiovascular;  Laterality: N/A;   TRANSTHORACIC ECHOCARDIOGRAM  09/07/2010   EF 45-50%; LV systolic function mildly reduced; LA mildly dilated; mild-mod MR & mild-mod TR; aortic root sclerosis/calcification;    Social History   Social History Narrative   Not on file   Immunization History  Administered Date(s) Administered   Fluad Quad(high Dose 65+) 10/17/2018, 10/29/2019, 12/16/2020, 11/16/2021   Influenza Whole 11/22/2002, 11/07/2003, 10/06/2009   Influenza, High Dose Seasonal PF 10/17/2017   Influenza,inj,Quad PF,6+ Mos 11/02/2012, 10/01/2013, 10/04/2014, 10/03/2015, 10/11/2016   PFIZER(Purple Top)SARS-COV-2 Vaccination 02/20/2019, 03/17/2019,  11/10/2019   Pfizer Covid-19 Vaccine Bivalent Booster 93yrs & up 11/06/2020   Pfizer(Comirnaty)Fall Seasonal Vaccine 12 years and older 12/31/2021   Pneumococcal Conjugate-13 01/26/2013   Pneumococcal Polysaccharide-23 10/27/1998, 03/31/2015   Td 03/27/2009   Tdap 05/24/2011   Zoster, Live 01/03/2012     Objective: Vital Signs: BP 115/67 (BP Location: Left Arm, Patient Position: Sitting, Cuff Size: Normal)   Pulse 94   Resp 14   Ht 5\' 8"  (1.727 m)   Wt 154 lb (69.9 kg)   BMI 23.42 kg/m    Physical Exam Cardiovascular:     Rate and Rhythm: Normal rate. Rhythm irregular.     Heart sounds: Murmur heard.  Pulmonary:     Effort: Pulmonary effort is normal.     Breath sounds: Normal breath sounds.  Musculoskeletal:     Comments: 3+ pitting edema bilateral legs  Skin:    General: Skin  is warm and dry.     Findings: No rash.  Neurological:     Mental Status: He is alert.  Psychiatric:        Mood and Affect: Mood normal.      Musculoskeletal Exam:  Neck full ROM no tenderness Shoulders slightly restricted overhead abduction, no tenderness to pressure or pain with resisted movement Elbows full ROM no tenderness or swelling Wrists full ROM no tenderness or swelling Fingers full ROM no tenderness or swelling Knees full ROM no tenderness or swelling  Investigation: No additional findings.  Imaging: No results found.  Recent Labs: Lab Results  Component Value Date   WBC 8.3 09/24/2022   HGB 12.7 (L) 09/24/2022   PLT 128 (L) 09/24/2022   NA 143 09/27/2022   K 3.6 09/27/2022   CL 101 09/27/2022   CO2 35 (H) 09/27/2022   GLUCOSE 171 (H) 09/27/2022   BUN 24 09/27/2022   CREATININE 1.14 09/27/2022   BILITOT 1.1 09/20/2022   ALKPHOS 43 08/16/2022   AST 12 09/20/2022   ALT 21 09/20/2022   PROT 5.7 (L) 09/20/2022   ALBUMIN 2.8 (L) 08/16/2022   CALCIUM 9.5 09/27/2022   GFRAA 72 09/13/2019    Speciality Comments: No specialty comments available.  Procedures:  No  procedures performed Allergies: Patient has no known allergies.   Assessment / Plan:     Visit Diagnoses: Temporal arteritis (HCC) - Plan: predniSONE (DELTASONE) 10 MG tablet  Labs appear okay to proceed and no symptom recurrence thus far.  Provided new steroid prescription with 10 mg tablets to facilitate and tapering scheduled beginning with 5 mg/week daily dose reduction starting with decrease to 35 mg daily this week.  Will continue close follow-up monthly until he gets off the high-dose prednisone.  Discussed monitoring for any symptom recurrence including visual changes of double vision or loss of vision affecting his right side, recurrence of jaw pain, or any new severe neck pain or headache. Completed additional paperwork for Actemra PA.  Orders: No orders of the defined types were placed in this encounter.  Meds ordered this encounter  Medications   predniSONE (DELTASONE) 10 MG tablet    Sig: Take 3.5 tablets (35 mg total) by mouth daily with breakfast for 7 days, THEN 3 tablets (30 mg total) daily with breakfast for 7 days, THEN 2.5 tablets (25 mg total) daily with breakfast for 7 days, THEN 2 tablets (20 mg total) daily with breakfast for 14 days.    Dispense:  91 tablet    Refill:  0     Follow-Up Instructions: Return in about 4 weeks (around 11/02/2022) for GCA on GC/?Actemra f/u 49mo.   Fuller Plan, MD  Note - This record has been created using [employer].  Chart creation errors have been sought, but may not always  have been located. Such creation errors do not reflect on  the standard of medical care.

## 2022-10-05 NOTE — Telephone Encounter (Signed)
Patient states he never received the Actemra PAP in his email inbox. He is scheduled to see Dr. Dimple Casey this afternoon. We have left a section of the application for him to fill out during his appointment with staff.   Sofie Rower, PharmD, [employer] PGY1

## 2022-10-06 NOTE — Telephone Encounter (Signed)
Submitted Patient Assistance Application to [place] for ACTEMRA SQ along with provider portion, patient portion, PA, medication list, insurance card copy. Will update patient when we receive a response.  Phone #: [phone] Fax #: [phone]  Chesley Mires, PharmD, MPH, BCPS, CPP Clinical Pharmacist (Rheumatology and Pulmonology)

## 2022-10-11 ENCOUNTER — Other Ambulatory Visit: Payer: Self-pay | Admitting: Family Medicine

## 2022-10-11 DIAGNOSIS — E782 Mixed hyperlipidemia: Secondary | ICD-10-CM

## 2022-10-12 ENCOUNTER — Telehealth: Payer: Self-pay

## 2022-10-12 ENCOUNTER — Other Ambulatory Visit: Payer: Self-pay | Admitting: Pharmacist

## 2022-10-12 ENCOUNTER — Other Ambulatory Visit: Payer: Self-pay | Admitting: Family Medicine

## 2022-10-12 DIAGNOSIS — Z79899 Other long term (current) drug therapy: Secondary | ICD-10-CM

## 2022-10-12 DIAGNOSIS — Z111 Encounter for screening for respiratory tuberculosis: Secondary | ICD-10-CM

## 2022-10-12 DIAGNOSIS — M316 Other giant cell arteritis: Secondary | ICD-10-CM

## 2022-10-12 NOTE — Telephone Encounter (Addendum)
Auth Submission: NO AUTH NEEDED Site of care: Site of care: [facility] Payer: Medicare A/B and UHC Medication & CPT/J Code(s) submitted: Actemra (Tocilizumab) [id number]) Route of submission (phone, fax, portal):  Phone # Fax # Auth type: Buy/Bill PB Units/visits requested: 6mg /kg x 4 doses Reference number:  Approval from: 10/12/22 to 02/11/23  Medicare A/B will cover 80%, UHC will cover the remaining 20%.

## 2022-10-12 NOTE — Telephone Encounter (Signed)
Spoke with [employer] representative to discuss reason for PAP denial. Rosanne Ashing had not met their out of pocket maximum requirement. Called patient to discuss this outcome. He has agreed to start Actemra IV infusions every 4 weeks in [place]. He is currently on Prednisone 35 mg daily and starts a new taper schedule of 30 mg daily tomorrow. He was advised to remain on the 30 mg daily until he starts the Actemra infusions.   Sofie Rower, PharmD [employer] PGY1

## 2022-10-12 NOTE — Progress Notes (Signed)
Therapy plan placed for Actemra IV (R6045) for [facility] to start benefits investigation  Diagnosis: GCA  Provider: Dr. Sheliah Hatch  Dose: 6mg /kg every 4 weeks  Last Clinic Visit: 10/05/22 Next Clinic Visit: 11/09/22  Pertinent Labs: CBC, CMP stable to proceed  Plt > 100, ANC > 2000, LFTS wnl  Patient needs TB gold. Pt notified. Will wait to SIGN order until TB gold updated  Chesley Mires, PharmD, MPH, BCPS, CPP Clinical Pharmacist (Rheumatology and Pulmonology)

## 2022-10-12 NOTE — Telephone Encounter (Signed)
Dr. Tanya Nones advised that TB gold order placed. Pt does not need appt. Called pt to advise of this. He will stop by PCP to have lab drawn - I recommended this week if possible  Donald Rios, PharmD, MPH, BCPS, CPP Clinical Pharmacist (Rheumatology and Pulmonology)

## 2022-10-12 NOTE — Telephone Encounter (Signed)
Rx discontinued 08/16/22 by Dr. Thedore Mins Requested Prescriptions  Pending Prescriptions Disp Refills   benazepril (LOTENSIN) 40 MG tablet [Pharmacy Med Name: BENAZEPRIL HCL 40 MG TABLET] 90 tablet 0    Sig: TAKE 1 TABLET BY MOUTH DAILY     Cardiovascular:  ACE Inhibitors Failed - 10/11/2022 10:05 AM      Failed - Valid encounter within last 6 months    Recent Outpatient Visits           1 year ago Right carotid bruit   [facility] Pickard, Priscille Heidelberg, MD   1 year ago Dehydration   [facility] Pickard, Priscille Heidelberg, MD   1 year ago Dysuria   [facility] [name], MD   2 years ago Type 2 diabetes mellitus without complication, without long-term current use of insulin (HCC)   [facility] Tanya Nones, Priscille Heidelberg, MD   2 years ago Benign essential HTN   [facility] Pickard, Priscille Heidelberg, MD       Future Appointments             In 4 weeks Rice, Jamesetta Orleans, MD [facility]   In 1 month Pickard, Priscille Heidelberg, MD [facility], [facility]   In 2 months Hilty, Lisette Abu, MD [facility] in normal range and within 180 days    Creat  Date Value Ref Range Status  09/27/2022 1.14 0.70 - 1.22 mg/dL Final   Creatinine, Urine  Date Value Ref Range Status  11/13/2021 94 20 - 320 mg/dL Final         Passed - K in normal range and within 180 days    Potassium  Date Value Ref Range Status  09/27/2022 3.6 3.5 - 5.3 mmol/L Final         Passed - Patient is not pregnant      Passed - Last BP in normal range    BP Readings from Last 1 Encounters:  10/05/22 115/67

## 2022-10-12 NOTE — Telephone Encounter (Signed)
Called patient to inform him we need updated TB screening prior to scheduling Actemra infusion. He requested we send a message his PCP Dr. Tanya Nones at [facility] so he can perform the testing there. Staff message was sent to Dr. Tanya Nones.   Sofie Rower, PharmD [employer] PGY1

## 2022-10-12 NOTE — Telephone Encounter (Signed)
Please do not schedule patient for Actemra infusion until TB gold is drawn and negative Order for Actemra infusion was not signed for this reason  Chesley Mires, PharmD, MPH, BCPS, CPP Clinical Pharmacist (Rheumatology and Pulmonology)

## 2022-10-12 NOTE — Telephone Encounter (Signed)
Received fax from [employer] for ACTEMRA SQ patient assistance, patient's application has been DENIED due to not meeting financial requirements .    Phone #: [phone] Fax #: [phone]    Chesley Mires, PharmD, MPH, BCPS, CPP Clinical Pharmacist (Rheumatology and Pulmonology)

## 2022-10-20 ENCOUNTER — Other Ambulatory Visit: Payer: Medicare Other

## 2022-10-20 DIAGNOSIS — M316 Other giant cell arteritis: Secondary | ICD-10-CM

## 2022-10-20 NOTE — Telephone Encounter (Signed)
MyChart message sent to patient as a reminder about the TB gold lab

## 2022-10-22 ENCOUNTER — Ambulatory Visit: Payer: Medicare Other | Admitting: Family Medicine

## 2022-10-22 ENCOUNTER — Encounter: Payer: Self-pay | Admitting: Family Medicine

## 2022-10-22 VITALS — BP 130/72 | HR 89 | Temp 98.0°F | Ht 67.0 in | Wt 155.0 lb

## 2022-10-22 DIAGNOSIS — Z23 Encounter for immunization: Secondary | ICD-10-CM

## 2022-10-22 DIAGNOSIS — L03011 Cellulitis of right finger: Secondary | ICD-10-CM | POA: Diagnosis not present

## 2022-10-22 MED ORDER — DOXYCYCLINE HYCLATE 100 MG PO TABS: 100.0000 mg | ORAL_TABLET | Freq: Two times a day (BID) | ORAL | 0 refills | Status: DC

## 2022-10-22 MED ORDER — CEPHALEXIN 500 MG PO CAPS: 500.0000 mg | ORAL_CAPSULE | Freq: Three times a day (TID) | ORAL | 0 refills | Status: DC

## 2022-10-22 NOTE — Addendum Note (Signed)
Addended by: Venia Carbon K on: 10/22/2022 03:23 PM   Modules accepted: Orders

## 2022-10-22 NOTE — Progress Notes (Signed)
Subjective:    Patient ID: Donald Rios, male    DOB: 04-01-1940, 82 y.o.   MRN: [id number]   On Tuesday, the patient struck his hand against a doorknob.  He sustained a small laceration just to the side of the right third MCP joint.  Over the last 2 days he has developed erythema and pain around the right third MCP joint.  The skin is hot to the touch and tender to the touch.  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    History of MI (myocardial infarction)    History of nuclear stress test 08/07/2010   dipyridamole; EKG negative for ischemia, low risk scan    Hyperlipidemia    Hyperplastic colon polyp 05/2008   Hypertension    Ischemic cardiomyopathy    EF 45%, with inferior wall motion abnormality    Myocardial infarction (HCC)    Valvular regurgitation    mitral and tricuspid (mild)   Past Surgical History:  Procedure Laterality Date   CARDIOVERSION N/A 08/16/2022   Procedure: CARDIOVERSION;  Surgeon: Jake Bathe, MD;  Location: [facility];  Service: Cardiovascular;  Laterality: N/A;   CORONARY ARTERY BYPASS GRAFT  2000   LIMA to LAD, free IMA to OM2, sequential graft to PLA & PLD   ESOPHAGOGASTRODUODENOSCOPY N/A 05/07/2013   Procedure: ESOPHAGOGASTRODUODENOSCOPY (EGD);  Surgeon: Iva Boop, MD;  Location[facility] ENDOSCOPY;  Service: Endoscopy;  Laterality: N/A;   TEE WITHOUT CARDIOVERSION N/A 08/16/2022   Procedure: TRANSESOPHAGEAL ECHOCARDIOGRAM;  Surgeon: Jake Bathe, MD;  Location:  [facility] CV [facility];  Service: Cardiovascular;  Laterality: N/A;   TRANSTHORACIC ECHOCARDIOGRAM  09/07/2010   EF 45-50%; LV systolic function mildly reduced; LA mildly dilated; mild-mod MR & mild-mod TR; aortic root sclerosis/calcification;    Current Outpatient Medications on File Prior to Visit  Medication Sig Dispense Refill   allopurinol (ZYLOPRIM) 300 MG tablet TAKE 1 TABLET BY MOUTH DAILY (Patient taking differently: Take 300 mg by mouth at bedtime.) 90 tablet 1   apixaban (ELIQUIS) 5 MG TABS tablet Take 1 tablet (5 mg total) by mouth 2 (two) times daily. 60 tablet 5   Blood Glucose Monitoring Suppl (BLOOD GLUCOSE MONITOR SYSTEM) w/Device KIT 1 each by Does not apply route in the morning, at noon, and at bedtime. 1 kit 0   doxazosin (CARDURA) 2 MG tablet TAKE 1 TABLET BY MOUTH DAILY (Patient taking differently: Take 2 mg by mouth at bedtime.) 90 tablet 3   empagliflozin (JARDIANCE) 10 MG TABS tablet Take 1 tablet (10 mg total) by mouth daily. 30 tablet 2   furosemide (LASIX) 20 MG tablet Take 1 tablet (20 mg total) by mouth daily. 30 tablet 3   furosemide (LASIX) 40 MG tablet Take 1 tablet (40 mg total) by mouth daily. 30 tablet 3   glipiZIDE (GLIPIZIDE XL) 5 MG 24 hr tablet Take 1 tablet (5  mg total) by mouth daily with breakfast. 30 tablet 3   Glucose Blood (BLOOD GLUCOSE TEST STRIPS) STRP Use in the morning, at noon, and at bedtime. May substitute to any manufacturer covered by patient's insurance. 100 strip 2   insulin aspart (FIASP FLEXTOUCH) 100 UNIT/ML FlexTouch Pen Before each meal 3 times a day, 140-199 - 2 units, 200-250 - 4 units, 251-299 - 6 units,  300-349 - 8 units,  350 or above 10 units. 9 mL 0   Insulin Pen Needle 32G X 4 MM MISC For 4 times a day insulin injection under the skin, 1 month supply. 100 each 0   losartan (COZAAR) 25 MG tablet Take 1 tablet (25 mg total) by mouth daily. 90 tablet 1   metoprolol tartrate (LOPRESSOR) 50 MG tablet TAKE 1 AND 1/2 TABLETS BY MOUTH TWO  TIMES A DAY 90 tablet 0   mirtazapine (REMERON) 30 MG tablet TAKE 1 TABLET BY MOUTH EVERY NIGHT AT BEDTIME FOR APPETITE 90 tablet 1   pantoprazole (PROTONIX) 40 MG tablet Take 1 tablet (40 mg total) by mouth daily. 30 tablet 3   predniSONE (DELTASONE) 10 MG tablet Take 3.5 tablets (35 mg total) by mouth daily with breakfast for 7 days, THEN 3 tablets (30 mg total) daily with breakfast for 7 days, THEN 2.5 tablets (25 mg total) daily with breakfast for 7 days, THEN 2 tablets (20 mg total) daily with breakfast for 14 days. 91 tablet 0   simvastatin (ZOCOR) 40 MG tablet TAKE ONE TABLET BY MOUTH DAILY (Patient taking differently: Take 40 mg by mouth at bedtime.) 90 tablet 3   spironolactone (ALDACTONE) 25 MG tablet Take 0.5 tablets (12.5 mg total) by mouth daily. 90 tablet 3   No current facility-administered medications on file prior to visit.     No Known Allergies Social History   Socioeconomic History   Marital status: Married    Spouse name: Not on file   Number of children: 1   Years of education: Not on file   Highest education level: Not on file  Occupational History   Not on file  Tobacco Use   Smoking status: Former    Current packs/day: 0.00    Types: Cigarettes    Quit date: 11/04/1985    Years since quitting: 36.9   Smokeless tobacco: Never  Vaping Use   Vaping status: Never Used  Substance and Sexual Activity   Alcohol use: Not Currently    Alcohol/week: 2.0 - 3.0 standard drinks of alcohol    Types: 2 - 3 Cans of beer per week   Drug use: No   Sexual activity: Not on file  Other Topics Concern   Not on file  Social History Narrative   Not on file   Social Determinants of Health   Financial Resource Strain: Low Risk  (08/24/2022)   Overall Financial Resource Strain (CARDIA)    Difficulty of Paying Living Expenses: Not hard at all  Food Insecurity: No Food Insecurity (08/24/2022)   Hunger Vital Sign    Worried About Running Out of Food in the Last Year: Never  true    Ran Out of Food in the Last Year: Never true  Transportation Needs: No Transportation Needs (08/24/2022)   PRAPARE - Administrator, Civil Service (Medical): No    Lack of Transportation (Non-Medical): No  Physical Activity: Sufficiently Active (07/10/2021)   Exercise Vital Sign    Days of Exercise per Week: 5 days  Minutes of Exercise per Session: 30 min  Stress: No Stress Concern Present (08/24/2022)   [employer] of Occupational Health - Occupational Stress Questionnaire    Feeling of Stress : Only a little  Social Connections: Socially Integrated (07/22/2022)   Social Connection and Isolation Panel [NHANES]    Frequency of Communication with Friends and Family: More than three times a week    Frequency of Social Gatherings with Friends and Family: More than three times a week    Attends Religious Services: More than 4 times per year    Active Member of [employer] or Organizations: Yes    Attends Banker Meetings: More than 4 times per year    Marital Status: Married  Catering manager Violence: Not At Risk (08/13/2022)   Humiliation, Afraid, Rape, and Kick questionnaire    Fear of Current or Ex-Partner: No    Emotionally Abused: No    Physically Abused: No    Sexually Abused: No     Review of Systems  All other systems reviewed and are negative.      Objective:   Physical Exam Vitals reviewed.  Constitutional:      Appearance: He is well-developed.  Neck:     Thyroid: No thyromegaly.     Vascular: No JVD.  Cardiovascular:     Rate and Rhythm: Normal rate. Rhythm irregular.     Heart sounds: Normal heart sounds. No murmur heard. Pulmonary:     Effort: Pulmonary effort is normal. No respiratory distress.     Breath sounds: Normal breath sounds. No wheezing or rales.  Chest:     Chest wall: No tenderness.  Abdominal:     General: Bowel sounds are normal. There is no distension.     Palpations: Abdomen is soft. There is no mass.      Tenderness: There is no abdominal tenderness. There is no guarding or rebound.  Musculoskeletal:       Hands:     Cervical back: Neck supple.  Lymphadenopathy:     Cervical: No cervical adenopathy.  Skin:    Findings: No rash.           Assessment & Plan:  Cellulitis of finger of right hand I am concerned the patient has cellulitis.  I cannot exclude septic arthritis.  Start the patient on Keflex 500 mg p.o. 3 times daily.  Patient states that he may have had MRSA in the past so I will also add doxycycline 100 mg p.o. twice daily.  Reassess on Monday.  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  If worsening, will need to see a hand specialist for septic arthritis.  Tetanus shot was updated.

## 2022-10-23 LAB — QUANTIFERON-TB GOLD PLUS
Mitogen-NIL: >10 [IU]/mL
NIL: 0.02 [IU]/mL
QuantiFERON-TB Gold Plus: NEGATIVE
TB1-NIL: 0 [IU]/mL
TB2-NIL: 0.01 [IU]/mL

## 2022-10-25 ENCOUNTER — Encounter: Payer: Self-pay | Admitting: Internal Medicine

## 2022-10-25 DIAGNOSIS — Z111 Encounter for screening for respiratory tuberculosis: Secondary | ICD-10-CM | POA: Insufficient documentation

## 2022-10-25 DIAGNOSIS — Z79899 Other long term (current) drug therapy: Secondary | ICD-10-CM | POA: Insufficient documentation

## 2022-10-25 NOTE — Progress Notes (Signed)
Thanks

## 2022-10-25 NOTE — Progress Notes (Signed)
TB gold negative on 10/20/2022. Actemra infusion orders signed for [facility]  Chesley Mires, PharmD, MPH, BCPS, CPP Clinical Pharmacist (Rheumatology and Pulmonology)

## 2022-10-26 ENCOUNTER — Other Ambulatory Visit: Payer: Self-pay | Admitting: Family Medicine

## 2022-10-26 ENCOUNTER — Telehealth: Payer: Self-pay

## 2022-10-26 DIAGNOSIS — E782 Mixed hyperlipidemia: Secondary | ICD-10-CM

## 2022-10-26 NOTE — Telephone Encounter (Signed)
Pt called in to let pcp/nurse know that his hand was feeling a little better today.Pt states that it is still swollen a bit,but not at painful. Please advise.  Cb#: [phone]

## 2022-10-27 NOTE — Telephone Encounter (Signed)
D/C 08/16/22 by Dr. Thedore Mins. Requested Prescriptions  Refused Prescriptions Disp Refills   benazepril (LOTENSIN) 40 MG tablet [Pharmacy Med Name: BENAZEPRIL HCL 40 MG TABLET] 90 tablet 0    Sig: TAKE 1 TABLET BY MOUTH DAILY     Cardiovascular:  ACE Inhibitors Failed - 10/26/2022  7:33 PM      Failed - Valid encounter within last 6 months    Recent Outpatient Visits           1 year ago Right carotid bruit   [facility] Pickard, Priscille Heidelberg, MD   1 year ago Dehydration   [facility] Pickard, Priscille Heidelberg, MD   1 year ago Dysuria   [facility] [name], MD   2 years ago Type 2 diabetes mellitus without complication, without long-term current use of insulin (HCC)   [facility] Tanya Nones, Priscille Heidelberg, MD   2 years ago Benign essential HTN   [facility] Pickard, Priscille Heidelberg, MD       Future Appointments             In 1 week Rice, Jamesetta Orleans, MD [facility] Rheumatology   In 3 weeks Pickard, Priscille Heidelberg, MD [facility], [facility]   In 1 month [place], Lisette Abu, MD [facility] - Cr in normal range and within 180 days    Creat  Date Value Ref Range Status  09/27/2022 1.14 0.70 - 1.22 mg/dL Final   Creatinine, Urine  Date Value Ref Range Status  11/13/2021 94 20 - 320 mg/dL Final         Passed - K in normal range and within 180 days    Potassium  Date Value Ref Range Status  09/27/2022 3.6 3.5 - 5.3 mmol/L Final         Passed - Patient is not pregnant      Passed - Last BP in normal range    BP Readings from Last 1 Encounters:  10/22/22 130/72

## 2022-10-27 NOTE — Progress Notes (Signed)
Office Visit Note  Patient: Donald Rios             Date of Birth: 01/04/1941           MRN: [id number]             PCP: [name], MD Referring: [name], MD Visit Date: 11/09/2022   Subjective:  Follow-up (Patient states he would like his feet looked at because the swelling will not go down. )   History of Present Illness: Donald Rios is a 82 y.o. 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 Prednisone is down to 25 mg anticipates decrease to 20 mg next week. He has no recurrence of headaches and no new vision changes. Biggest problem is his leg swelling, ongoing despite wearing compressive socks, elevating feet, and on lasix 40 mg daily. Has some areas of skin breakage around the left ankle and heel.  Previous HPI 10/05/2022 Donald Rios is a 82 y.o. 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  Lab results at previous visit looked fine with normal serum inflammatory markers.  He had recent follow-up with Dr. Tanya Nones labs at that time showing improvement in platelets up to 128.  He still has extensive lower extremity edema.  He was needing paperwork for Actemra application today so had not progressed PA yet.   Previous HPI 09/06/22 Donald Rios is a 82 y.o. 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  He has a medical history significant for coronary artery disease status post CABG, ischemic cardiomyopathy, type 2 diabetes, gout, and PMR.  Symptoms started at the beginning of this month with a sudden onset of left eye floaters and blurriness in his vision initially in the lower part of the field and then across his entire eye.  This progressively worsened over 1 to 2 days he saw his ophthalmologist for this found to have disc  edema with highly elevated sedimentation rate and CRP and sent to the emergency department for evaluation suspected giant cell arteritis.  On admission he was also found to be in A-fib with RVR which is new for him.  After the onset of vision changes he also developed jaw pain this was present mildly at rest and worsened with chewing.  Did not experience any recurrence of previous shoulder or hip pain or stiffness.  In hospital he was found to have elevated serum inflammatory markers ultrasound examination of the temporal arteries indicated a positive halo sign.  He underwent MR angiography of the head and neck that showed extensive arterial disease and microvascular disease throughout without any definite large vessel arteritis.  He was initially treated with high-dose intravenous steroids then transition to prednisone 60 mg daily.  Had improvement of symptoms jaw pain completely resolved after about 5 days in total.  Unfortunately left eye vision did not significantly improve has some light sensitivity and can see specks but no visual acuity. Right eye has some cloudiness in his vision this has been present since before these recent events and not changed since they started. He began gradual prednisone taper has decreased down to 20 mg daily at this time has not noticed any particular change or return of symptoms with decrease in the dose.  Does have  increase in appetite, increase leg swelling, and increased blood sugars since starting the high-dose prednisone. He underwent TEE with direct-current cardioversion on August 5 unfortunately was reverted back into A-fib by time of cardiology follow-up 1 week later.  Findings also indicated decrease in his previous ejection fraction now indicating 30% with moderate to severe mitral regurgitation. He has a previous history of polymyalgia rheumatica was diagnosed and managed with Dr. Dierdre Forth at [facility] rheumatology from patient's report started in 2018.  This was  characterized by fairly typical sounding shoulder and hip joint pain and stiffness and was treated with a long course of oral steroids.  He had been off any maintenance treatment for years and is no longer actively followed with their office.     Labs reviewed 08/12/2022 ESR 51 CRP 56.3 Lipids HDL 34 else WNL   08/2020 HCV neg   Imaging reviewed 08/19/22 BT Abdomen Pelvis W Contrast IMPRESSION: Colonic diverticulosis, without radiographic evidence of diverticulitis or other acute findings.   Cholelithiasis. No radiographic evidence of cholecystitis.   Small left pleural effusion.     08/14/22 Temporal Artery [place] Technically difficult and limited study, see indications above. No compression of bilateral temporal arteries at proximal, mid, or distal  segments. Abnormal flow noted throughout. Summary:  Presence of a "halo" sign in the bilateral temporal artery suggests temporal arteritis.      08/13/22 MRI/MRA Head/Neck IMPRESSION: MRI HEAD IMPRESSION:   1. No acute intracranial abnormality. 2. 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 3. Innumerable punctate chronic micro hemorrhages involving both cerebral hemispheres. Overall pattern is nonspecific, with differential considerations including sequelae of prior cardiac bypass, chronic hypertension, cerebral amyloid angiopathy, prior trauma, or possibly vasculitis.   MRA HEAD IMPRESSION:   1. Negative intracranial MRA for large vessel occlusion. No hemodynamically significant or correctable stenosis. 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 5. 3 mm supraclinoid left ICA aneurysm.   MRA NECK IMPRESSION:   1. No acute vascular abnormality within the neck. 2. Mild atheromatous irregularity about both carotid artery systems within the neck without hemodynamically significant greater than 50%  stenosis. 3. Patent vertebral arteries within the neck. Right vertebral artery dominant.   Review of Systems  Constitutional:  Positive for fatigue.  HENT:  Negative for mouth sores and mouth dryness.   Eyes:  Negative for dryness.  Respiratory:  Positive for shortness of breath.   Cardiovascular:  Negative for chest pain and palpitations.  Gastrointestinal:  Negative for blood in stool, constipation and diarrhea.  Endocrine: Positive for increased urination.  Genitourinary:  Negative for involuntary urination.  Musculoskeletal:  Negative for joint pain, gait problem, joint pain, joint swelling, myalgias, muscle weakness, morning stiffness, muscle tenderness and myalgias.  Skin:  Negative for color change, rash, hair loss and sensitivity to sunlight.  Allergic/Immunologic: Negative for susceptible to infections.  Neurological:  Negative for dizziness and headaches.  Hematological:  Negative for swollen glands.  Psychiatric/Behavioral:  Negative for depressed mood and sleep disturbance. The patient is not nervous/anxious.     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 08/30/2017   Cardiomyopathy, ischemic 12/23/2015   Chronic systolic congestive heart failure, NYHA class 1 (HCC) 12/23/2015   Acute gastric ulcer with hemorrhage 05/06/2013   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    Mild tricuspid regurgitation    Mitral regurgitation    Atrial enlargement, left    Hearing loss    Diverticulosis    Hiatal hernia    Gout    DM2 (diabetes mellitus, type 2) (HCC)     GERD (gastroesophageal reflux disease)    Mixed hyperlipidemia    Essential hypertension     Past Medical History:  Diagnosis Date   Acute gastric ulcer with hemorrhage 05/06/2013   Arthritis    Asthma    Atrial enlargement, left    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    History of MI (myocardial infarction)    History of nuclear stress test 08/07/2010   dipyridamole; EKG negative for ischemia, low risk scan    Hyperlipidemia    Hyperplastic colon polyp 05/2008   Hypertension    Ischemic cardiomyopathy    EF 45%, with inferior wall motion abnormality    Myocardial infarction (HCC)    Valvular regurgitation    mitral and tricuspid (mild)    Family History  Problem Relation Age of Onset   Stroke Father    Hypertension Father    Diabetes Brother    Esophageal cancer Brother    Stroke Maternal Grandmother    Stroke Maternal Grandfather    Stomach cancer Maternal Aunt    Colon cancer Neg Hx    Rectal cancer Neg Hx    Past Surgical History:  Procedure Laterality Date   CARDIOVERSION N/A 08/16/2022   Procedure: CARDIOVERSION;  Surgeon: Jake Bathe, MD;  Location: [facility] INVASIVE CV LAB;  Service: Cardiovascular;  Laterality: N/A;   CORONARY ARTERY BYPASS GRAFT  2000   LIMA to LAD, free IMA to OM2, sequential graft to PLA & PLD   ESOPHAGOGASTRODUODENOSCOPY N/A 05/07/2013   Procedure: ESOPHAGOGASTRODUODENOSCOPY (EGD);  Surgeon: Iva Boop, MD;  Location: [facility] ENDOSCOPY;  Service: Endoscopy;  Laterality: N/A;   TEE WITHOUT CARDIOVERSION N/A 08/16/2022   Procedure: TRANSESOPHAGEAL ECHOCARDIOGRAM;  Surgeon: Jake Bathe, MD;  Location: [facility] INVASIVE CV LAB;  Service: Cardiovascular;   Laterality: N/A;   TRANSTHORACIC ECHOCARDIOGRAM  09/07/2010   EF 45-50%; LV systolic function mildly reduced; LA mildly dilated; mild-mod MR & mild-mod TR; aortic root sclerosis/calcification;    Social History   Social History Narrative   Not on file   Immunization History  Administered Date(s) Administered   Fluad Quad(high Dose 65+) 10/17/2018, 10/29/2019, 12/16/2020, 11/16/2021   Influenza Whole 11/22/2002, 11/07/2003, 10/06/2009   Influenza, High Dose Seasonal PF 10/17/2017   Influenza,inj,Quad PF,6+ Mos 11/02/2012, 10/01/2013, 10/04/2014, 10/03/2015, 10/11/2016   PFIZER(Purple Top)SARS-COV-2 Vaccination 02/20/2019, 03/17/2019, 11/10/2019   Pfizer Covid-19 Vaccine Bivalent Booster 13yrs & up 11/06/2020   Pfizer(Comirnaty)Fall Seasonal Vaccine 12 years and older 12/31/2021   Pneumococcal Conjugate-13 01/26/2013   Pneumococcal Polysaccharide-23 10/27/1998, 03/31/2015   Td 03/27/2009   Tdap  05/24/2011, 10/22/2022   Zoster, Live 01/03/2012     Objective: Vital Signs: BP 114/75 (BP Location: Left Arm, Patient Position: Sitting, Cuff Size: Normal)   Pulse 97   Resp 16   Ht 5\' 8"  (1.727 m)   Wt 158 lb (71.7 kg)   BMI 24.02 kg/m    Physical Exam Eyes:     Conjunctiva/sclera: Conjunctivae normal.  Cardiovascular:     Rate and Rhythm: Normal rate. Rhythm irregular.     Comments: Loud holosystolic murmur audible at left sternal border and heart apex Pulmonary:     Effort: Pulmonary effort is normal.     Breath sounds: Normal breath sounds.  Musculoskeletal:     Comments: Severe bilateral pedal edema bilateral Partial thickness skin breakage around left heel and early left ankle ulcer, some clear drainage  Lymphadenopathy:     Cervical: No cervical adenopathy.  Skin:    General: Skin is warm and dry.  Neurological:     Mental Status: He is alert.  Psychiatric:        Mood and Affect: Mood normal.      Musculoskeletal Exam:  Shoulders full ROM no tenderness or  swelling Elbows full ROM no tenderness or swelling Wrists full ROM no tenderness or swelling Fingers full ROM no tenderness or swelling   Investigation: No additional findings.  Imaging: No results found.  Recent Labs: Lab Results  Component Value Date   WBC 8.3 09/24/2022   HGB 12.7 (L) 09/24/2022   PLT 128 (L) 09/24/2022   NA 143 09/27/2022   K 3.6 09/27/2022   CL 101 09/27/2022   CO2 35 (H) 09/27/2022   GLUCOSE 171 (H) 09/27/2022   BUN 24 09/27/2022   CREATININE 1.14 09/27/2022   BILITOT 1.1 09/20/2022   ALKPHOS 43 08/16/2022   AST 12 09/20/2022   ALT 21 09/20/2022   PROT 5.7 (L) 09/20/2022   ALBUMIN 2.8 (L) 08/16/2022   CALCIUM 9.5 09/27/2022   GFRAA 72 09/13/2019   QFTBGOLDPLUS NEGATIVE 10/20/2022    Speciality Comments: No specialty comments available.  Procedures:  No procedures performed Allergies: Patient has no known allergies.   Assessment / Plan:     Visit Diagnoses: Temporal arteritis (HCC)  - Plan: Sedimentation rate, C-reactive protein  GCA symptoms appear stable. Just started actemra IV 4 days prior without complication. Rechecking sed rate and CRP if normal can continue gradual prednisone taper down to 20 mg next week then slow to 2.5 mg increments week by week afterwards.  Long term (current) use of systemic steroids   May be exacerbating fluid retention, currently severely edematous. Blood sugars have been elevated but not in acutely concerning range.  High risk medication use - Plan: CBC with Differential/Platelet, COMPLETE METABOLIC PANEL WITH GFR  Checking CBC and CMP for medication monitoring with Actemra. Also appears to need increased diuretic dosage making sure renal function is still normal.  Pedal edema  Associated skin breakdown at left ankle and heel. Not sure if he has any peripheral vascular disease limiting compression therapy. Will refer to wound care for evaluation.  Chronic systolic congestive heart failure, NYHA class 1  (HCC) - Plan: B Nat Peptide  Checking BNP, last level 565. Appears to need increased diuretic dose will contact cardiology office for management recommendation. He has appointment scheduled in November currently.    Orders: Orders Placed This Encounter  Procedures   Sedimentation rate   C-reactive protein   CBC with Differential/Platelet   COMPLETE METABOLIC PANEL WITH GFR  B Nat Peptide   No orders of the defined types were placed in this encounter.    Follow-Up Instructions: Return in about 4 weeks (around 12/07/2022) for GCA on TOC/GC taper f/u 40mo.   Fuller Plan, MD  Note - This record has been created using [employer].  Chart creation errors have been sought, but may not always  have been located. Such creation errors do not reflect on  the standard of medical care.

## 2022-10-29 NOTE — Progress Notes (Signed)
Actemra iV infusion scheduled for 11/04/22

## 2022-10-30 ENCOUNTER — Other Ambulatory Visit: Payer: Self-pay | Admitting: Family Medicine

## 2022-10-30 DIAGNOSIS — E119 Type 2 diabetes mellitus without complications: Secondary | ICD-10-CM

## 2022-11-01 NOTE — Telephone Encounter (Signed)
Requested Prescriptions  Pending Prescriptions Disp Refills   allopurinol (ZYLOPRIM) 300 MG tablet [Pharmacy Med Name: ALLOPURINOL 300 MG TABLET] 90 tablet 0    Sig: TAKE 1 TABLET BY MOUTH DAILY     Endocrinology:  Gout Agents - allopurinol Failed - 10/30/2022  6:41 AM      Failed - Uric Acid in normal range and within 360 days    Uric Acid, Serum  Date Value Ref Range Status  04/11/2012 4.6 4.0 - 7.8 mg/dL Final         Failed - Valid encounter within last 12 months    Recent Outpatient Visits           1 year ago Right carotid bruit   [facility] Pickard, Priscille Heidelberg, MD   1 year ago Dehydration   [facility] Pickard, Priscille Heidelberg, MD   1 year ago Dysuria   [facility] [name], MD   2 years ago Type 2 diabetes mellitus without complication, without long-term current use of insulin (HCC)   [facility] Tanya Nones, Priscille Heidelberg, MD   2 years ago Benign essential HTN   [facility] Pickard, Priscille Heidelberg, MD       Future Appointments             In 1 week Rice, Jamesetta Orleans, MD [facility]   In 2 weeks Pickard, Priscille Heidelberg, MD [facility]   In 1 month Garfield, Lisette Abu, MD [facility] - Cr in normal range and within 360 days    Creat  Date Value Ref Range Status  09/27/2022 1.14 0.70 - 1.22 mg/dL Final   Creatinine, Urine  Date Value Ref Range Status  11/13/2021 94 20 - 320 mg/dL Final         Passed - CBC within normal limits and completed in the last 12 months    WBC  Date Value Ref Range Status  09/24/2022 8.3 3.4 - 10.8 x10E3/uL Final  09/06/2022 6.2 3.8 - 10.8 Thousand/uL Final   RBC  Date Value Ref Range Status  09/24/2022 3.87 (L) 4.14 - 5.80 x10E6/uL Final  09/06/2022 3.39 (L) 4.20 - 5.80 Million/uL Final   Hemoglobin  Date Value Ref Range Status  09/24/2022 12.7 (L) 13.0 - 17.7 g/dL  Final   Hematocrit  Date Value Ref Range Status  09/24/2022 39.9 37.5 - 51.0 % Final   MCHC  Date Value Ref Range Status  09/24/2022 31.8 31.5 - 35.7 g/dL Final  44/03/4740 59.5 32.0 - 36.0 g/dL Final   [facility]  Date Value Ref Range Status  09/24/2022 32.8 26.6 - 33.0 pg Final  09/06/2022 31.9 27.0 - 33.0 pg Final   MCV  Date Value Ref Range Status  09/24/2022 103 (H) 79 - 97 fL Final   No results found for: "PLTCOUNTKUC", "LABPLAT", "POCPLA" RDW  Date Value Ref Range Status  09/24/2022 16.1 (H) 11.6 - 15.4 % Final

## 2022-11-04 ENCOUNTER — Ambulatory Visit: Payer: Medicare Other

## 2022-11-04 VITALS — BP 135/84 | HR 79 | Temp 98.5°F | Resp 16 | Ht 68.0 in | Wt 156.0 lb

## 2022-11-04 DIAGNOSIS — M316 Other giant cell arteritis: Secondary | ICD-10-CM

## 2022-11-04 DIAGNOSIS — Z79899 Other long term (current) drug therapy: Secondary | ICD-10-CM | POA: Diagnosis not present

## 2022-11-04 MED ORDER — TOCILIZUMAB 400 MG/20ML IV SOLN
6.0000 mg/kg | Freq: Once | INTRAVENOUS | Status: AC
  Administered 2022-11-04: 422 mg via INTRAVENOUS
  Filled 2022-11-04: qty 20

## 2022-11-04 MED ORDER — ACETAMINOPHEN 325 MG PO TABS
650.0000 mg | ORAL_TABLET | Freq: Once | ORAL | Status: AC
  Administered 2022-11-04: 650 mg via ORAL
  Filled 2022-11-04: qty 2

## 2022-11-04 MED ORDER — DIPHENHYDRAMINE HCL 25 MG PO CAPS
25.0000 mg | ORAL_CAPSULE | Freq: Once | ORAL | Status: AC
  Administered 2022-11-04: 25 mg via ORAL
  Filled 2022-11-04: qty 1

## 2022-11-04 NOTE — Progress Notes (Signed)
Diagnosis: Temporal arteritis   Provider:  Chilton Greathouse MD  Procedure: IV Infusion  IV Type: Peripheral, IV Location: right wrist  Actemra (Tocilizumab), Dose: 422  mg  Infusion Start Time: 1421  Infusion Stop Time: 1537  Post Infusion IV Care: Observation period completed and Peripheral IV Discontinued  Discharge: Condition: Good, Destination: Home . AVS Provided  Performed by:  Rico Ala, LPN

## 2022-11-04 NOTE — Patient Instructions (Signed)
Tocilizumab Injection What is this medication? TOCILIZUMAB (TOE si LIZ ue mab) treats autoimmune conditions, such as arthritis. It works by slowing down an overactive immune system. It may also be used to treat severe COVID-19 in people who are hospitalized. It is a monoclonal antibody. This medicine may be used for other purposes; ask your health care provider or pharmacist if you have questions. COMMON BRAND NAME(S): Actemra, TOFIDENCE, Tyenne What should I tell my care team before I take this medication? They need to know if you have any of these conditions: Cancer Diabetes Heart disease History of or current hepatitis B infection High blood pressure High cholesterol Immune system problems Infection Liver disease Low blood counts, such as low white cell, platelet, or red cell counts Multiple sclerosis Recent or upcoming vaccine Stomach or intestine problems Stroke An unusual or allergic reaction to tocilizumab, other medications, foods, dyes, or preservatives Pregnant or trying to get pregnant Breast-feeding How should I use this medication? This medication is injected into a vein or under the skin. It may be given by your care team in a hospital or clinic setting. It may also be given at home. If you get this medication at home, you will be taught how to prepare and give it. Use it exactly as directed. Take it as directed on the prescription label. Keep taking it unless your care team tells you to stop. If you use a pen, be sure to take off the outer needle cover before using the dose. It is important that you put your used needles and syringes in a special sharps container. Do not put them in a trash can. If you do not have a sharps container, call your pharmacist or care team to get one. A special MedGuide will be given to you by the pharmacist with each prescription and refill. Be sure to read this information carefully each time. Talk to your care team about the use of this  medication in children. While it may be prescribed for children as young as 2 years for selected conditions, precautions do apply. Overdosage: If you think you have taken too much of this medicine contact a poison control center or emergency room at once. NOTE: This medicine is only for you. Do not share this medicine with others. What if I miss a dose? If you get this medication at the hospital or clinic: It is important not to miss your dose. Call your care team if you are unable to keep an appointment. If you give yourself this medication at home: If you miss a dose, take it as soon as you can. If it is almost time for your next dose, take only that dose. Do not take double or extra doses. Call your care team with questions. What may interact with this medication? Do not take this medication with any of the following: Live virus vaccines This medication may also interact with the following: Biologic medications, such as abatacept, adalimumab, anakinra, certolizumab, etanercept, golimumab, infliximab, rituximab, secukinumab, ustekinumab Certain medications for cholesterol, such as atorvastatin, lovastatin, simvastatin Cyclosporine Estrogen or progestin hormones Omeprazole Steroid medications, such as prednisone or cortisone Theophylline Vaccines Warfarin This list may not describe all possible interactions. Give your health care provider a list of all the medicines, herbs, non-prescription drugs, or dietary supplements you use. Also tell them if you smoke, drink alcohol, or use illegal drugs. Some items may interact with your medicine. What should I watch for while using this medication? Visit your care team for regular  checks on your progress. Your condition will be monitored carefully while you are receiving this medication. Tell your care team if your symptoms do not start to get better or if they get worse. You may need blood work done while taking this medication. You will be tested for  tuberculosis (TB) before you start this medication. If your care team prescribes any medication for TB, you should start taking the TB medication before starting this medication. Make sure to finish the full course of TB medication. This medication may increase your risk of getting an infection. Call your care team for advice if you get a fever, chills, sore throat, or other symptoms of a cold or flu. Do not treat yourself. Try to avoid being around people who are sick. Talk to your care team about your risk of cancer. You may be more at risk for certain types of cancers if you take this medication. What side effects may I notice from receiving this medication? Side effects that you should report to your care team as soon as possible: Allergic reactions--skin rash, itching, hives, swelling of the face, lips, tongue, or throat Infection--fever, chills, cough, sore throat, wounds that don't heal, pain or trouble when passing urine, general feeling of discomfort or being unwell Liver injury--right upper belly pain, loss of appetite, nausea, light-colored stool, dark yellow or brown urine, yellowing skin or eyes, unusual weakness or fatigue Stomach pain that is severe, does not go away, or gets worse Unusual bruising or bleeding Side effects that usually do not require medical attention (report to your care team if they continue or are bothersome): Dizziness Headache Increase in blood pressure Pain, redness, or irritation at injection site Runny or stuffy nose Sore throat Stomach pain This list may not describe all possible side effects. Call your doctor for medical advice about side effects. You may report side effects to FDA at 1-800-FDA-1088. Where should I keep my medication? Keep out of the reach of children and pets. You will be instructed on how to store this medication. Get rid of any unused medication after the expiration date on the label. To get rid of medications that are no longer  needed or have expired: Take the medication to a medication take-back program. Check with your pharmacy or law enforcement to find a location. If you cannot return the medication, ask your pharmacist or care team how to get rid of this medication safely. NOTE: This sheet is a summary. It may not cover all possible information. If you have questions about this medicine, talk to your doctor, pharmacist, or health care provider.  2024 [employer] (2021-04-08 00:00:00)

## 2022-11-05 NOTE — Addendum Note (Signed)
Addended by: Murrell Redden on: 11/05/2022 08:17 AM   Modules accepted: Orders

## 2022-11-09 ENCOUNTER — Encounter: Payer: Self-pay | Admitting: Internal Medicine

## 2022-11-09 ENCOUNTER — Ambulatory Visit: Payer: Medicare Other | Attending: Internal Medicine | Admitting: Internal Medicine

## 2022-11-09 VITALS — BP 114/75 | HR 97 | Resp 16 | Ht 68.0 in | Wt 158.0 lb

## 2022-11-09 DIAGNOSIS — Z7952 Long term (current) use of systemic steroids: Secondary | ICD-10-CM | POA: Diagnosis not present

## 2022-11-09 DIAGNOSIS — I5022 Chronic systolic (congestive) heart failure: Secondary | ICD-10-CM

## 2022-11-09 DIAGNOSIS — M316 Other giant cell arteritis: Secondary | ICD-10-CM | POA: Diagnosis not present

## 2022-11-09 DIAGNOSIS — Z79899 Other long term (current) drug therapy: Secondary | ICD-10-CM

## 2022-11-09 DIAGNOSIS — R600 Localized edema: Secondary | ICD-10-CM | POA: Diagnosis not present

## 2022-11-10 ENCOUNTER — Other Ambulatory Visit: Payer: Self-pay | Admitting: *Deleted

## 2022-11-10 DIAGNOSIS — R600 Localized edema: Secondary | ICD-10-CM

## 2022-11-10 LAB — CBC WITH DIFFERENTIAL/PLATELET
Absolute Lymphocytes: 3293 {cells}/uL (ref 850–3900)
Absolute Monocytes: 547 {cells}/uL (ref 200–950)
Basophils Absolute: 38 {cells}/uL (ref 0–200)
Basophils Relative: 0.4 %
Eosinophils Absolute: 29 {cells}/uL (ref 15–500)
Eosinophils Relative: 0.3 %
HCT: 38.5 % (ref 38.5–50.0)
Hemoglobin: 12.5 g/dL — ABNORMAL LOW (ref 13.2–17.1)
MCH: 32.9 pg (ref 27.0–33.0)
MCHC: 32.5 g/dL (ref 32.0–36.0)
MCV: 101.3 fL — ABNORMAL HIGH (ref 80.0–100.0)
MPV: 13.2 fL — ABNORMAL HIGH (ref 7.5–12.5)
Monocytes Relative: 5.7 %
Neutro Abs: 5693 {cells}/uL (ref 1500–7800)
Neutrophils Relative %: 59.3 %
Platelets: 127 10*3/uL — ABNORMAL LOW (ref 140–400)
RBC: 3.8 10*6/uL — ABNORMAL LOW (ref 4.20–5.80)
RDW: 14.6 % (ref 11.0–15.0)
Total Lymphocyte: 34.3 %
WBC: 9.6 10*3/uL (ref 3.8–10.8)

## 2022-11-10 LAB — COMPLETE METABOLIC PANEL WITH GFR
AG Ratio: 2.4 (calc) (ref 1.0–2.5)
ALT: 32 U/L (ref 9–46)
AST: 20 U/L (ref 10–35)
Albumin: 3.9 g/dL (ref 3.6–5.1)
Alkaline phosphatase (APISO): 49 U/L (ref 35–144)
BUN: 23 mg/dL (ref 7–25)
CO2: 32 mmol/L (ref 20–32)
Calcium: 8.9 mg/dL (ref 8.6–10.3)
Chloride: 102 mmol/L (ref 98–110)
Creat: 1.18 mg/dL (ref 0.70–1.22)
Globulin: 1.6 g/dL — ABNORMAL LOW (ref 1.9–3.7)
Glucose, Bld: 44 mg/dL — ABNORMAL LOW (ref 65–99)
Potassium: 3.9 mmol/L (ref 3.5–5.3)
Sodium: 143 mmol/L (ref 135–146)
Total Bilirubin: 1.1 mg/dL (ref 0.2–1.2)
Total Protein: 5.5 g/dL — ABNORMAL LOW (ref 6.1–8.1)
eGFR: 62 mL/min/{1.73_m2} (ref >=60)

## 2022-11-10 LAB — SEDIMENTATION RATE: Sed Rate: 6 mm/h (ref 0–20)

## 2022-11-10 LAB — C-REACTIVE PROTEIN: CRP: <3 mg/L (ref <8.0)

## 2022-11-10 LAB — BRAIN NATRIURETIC PEPTIDE: Brain Natriuretic Peptide: 162 pg/mL — ABNORMAL HIGH (ref <100)

## 2022-11-10 NOTE — Progress Notes (Signed)
Blood glucose was low at 44.  It is likely that his medicines for high blood sugar need to be adjusted since he is decreasing the amount of prednisone. Blood count is stable with hemoglobin of 12.5 and platelets 127. Kidney and liver function are normal. The BNP is 162 this is down from 560 at his previous hospitalization.  I am not sure about why the number is better if his swelling is doing worse. We sent a referral to wound care to make an appointment and look at his foot and ankle. I recommend he try increase his Lasix to 2 pills daily for the next week and see if this reduces any swelling.  Or if we hear back from his PCP or cardiologist with different recommendations.

## 2022-11-11 ENCOUNTER — Telehealth: Payer: Self-pay

## 2022-11-11 DIAGNOSIS — I5021 Acute systolic (congestive) heart failure: Secondary | ICD-10-CM

## 2022-11-11 MED ORDER — FUROSEMIDE 40 MG PO TABS: 40.0000 mg | ORAL_TABLET | Freq: Two times a day (BID) | ORAL | 1 refills | Status: DC

## 2022-11-11 MED ORDER — POTASSIUM CHLORIDE CRYS ER 20 MEQ PO TBCR: 20.0000 meq | EXTENDED_RELEASE_TABLET | Freq: Once | ORAL | 0 refills | Status: DC

## 2022-11-11 NOTE — Telephone Encounter (Signed)
Please have patient increase furosemide from 40 mg poqday to 40 mg pobid and add kdur 20 meq poqday and recheck swelling and bmp next week at ov.   Pt advised and verbalized understanding of all. Rx sent to pharmacy for bid furosemide and kdur as ordered. Mjp,lpn

## 2022-11-15 ENCOUNTER — Ambulatory Visit: Payer: Medicare Other | Admitting: Family Medicine

## 2022-11-15 ENCOUNTER — Telehealth: Payer: Self-pay

## 2022-11-15 ENCOUNTER — Encounter: Payer: Self-pay | Admitting: Family Medicine

## 2022-11-15 VITALS — BP 136/74 | HR 57 | Temp 98.0°F | Ht 68.0 in | Wt 160.1 lb

## 2022-11-15 DIAGNOSIS — I5021 Acute systolic (congestive) heart failure: Secondary | ICD-10-CM | POA: Diagnosis not present

## 2022-11-15 NOTE — Telephone Encounter (Signed)
Pt called in stating the both feet are still swollen. Pt stated that he now has red patches and wounds on his feet. Pt stated that he has some blurred vision in his right eye as well. Pt was offered an appt today to come in to see pcp, but wanted to wait to hear from nurse first. Please advise.  Cb#: [phone]

## 2022-11-15 NOTE — Progress Notes (Signed)
Subjective:    Patient ID: Donald Rios, male    DOB: [birth year], 82 y.o.   MRN: [id number]  Despite having increased his Lasix from 40 mg once a day to 40 mg twice a day, the patient presents with significant edema in both lower extremities.  He has +2 edema in his feet and in his ankles +1 edema to his knees.  He denies any shortness of breath or chest pain.  He has purpura on both legs due to the vascular congestion exacerbated by Eliquis. Wt Readings from Last 3 Encounters:  11/15/22 160 lb 2 oz (72.6 kg)  11/09/22 158 lb (71.7 kg)  11/04/22 156 lb (70.8 kg)    Past Medical History:  Diagnosis Date   Acute gastric ulcer with hemorrhage 05/06/2013   Arthritis    Asthma    Atrial enlargement, left    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    History of MI (myocardial infarction)    History of nuclear stress test 08/07/2010   dipyridamole; EKG negative for ischemia, low risk scan    Hyperlipidemia    Hyperplastic colon polyp 05/2008   Hypertension    Ischemic cardiomyopathy    EF 45%, with inferior wall motion abnormality    Myocardial infarction (HCC)    Valvular regurgitation    mitral and tricuspid (mild)   Past Surgical History:  Procedure Laterality Date   CARDIOVERSION N/A 08/16/2022   Procedure: CARDIOVERSION;  Surgeon: Jake Bathe, MD;  Location: [facility] INVASIVE CV [facility];  Service: Cardiovascular;  Laterality: N/A;   CORONARY ARTERY BYPASS GRAFT  2000   LIMA to LAD, free IMA to OM2, sequential graft to PLA & PLD   ESOPHAGOGASTRODUODENOSCOPY N/A 05/07/2013   Procedure: ESOPHAGOGASTRODUODENOSCOPY (EGD);  Surgeon: Iva Boop, MD;  Location: [facility] ENDOSCOPY;  Service:  Endoscopy;  Laterality: N/A;   TEE WITHOUT CARDIOVERSION N/A 08/16/2022   Procedure: TRANSESOPHAGEAL ECHOCARDIOGRAM;  Surgeon: Jake Bathe, MD;  Location: [facility] INVASIVE CV LAB;  Service: Cardiovascular;  Laterality: N/A;   TRANSTHORACIC ECHOCARDIOGRAM  09/07/2010   EF 45-50%; LV systolic function mildly reduced; LA mildly dilated; mild-mod MR & mild-mod TR; aortic root sclerosis/calcification;    Current Outpatient Medications on File Prior to Visit  Medication Sig Dispense Refill   allopurinol (ZYLOPRIM) 300 MG tablet TAKE 1 TABLET BY MOUTH DAILY 90 tablet 0   apixaban (ELIQUIS) 5 MG TABS tablet Take 1 tablet (5 mg total) by mouth 2 (two) times daily. 60 tablet 5   Blood Glucose Monitoring Suppl (BLOOD GLUCOSE MONITOR SYSTEM) w/Device KIT 1 each by Does not apply route in the morning, at noon, and at bedtime. 1 kit 0   cephALEXin (KEFLEX) 500 MG capsule Take 1 capsule (500 mg total) by mouth 3 (three) times daily. (Patient not taking: Reported on 11/09/2022) 21 capsule 0   doxazosin (CARDURA) 2 MG tablet TAKE 1 TABLET BY MOUTH DAILY (Patient taking differently: Take 2 mg by mouth at bedtime.) 90 tablet 3   doxycycline (VIBRA-TABS) 100 MG tablet Take 1 tablet (100 mg total) by mouth 2 (two) times daily. 14 tablet 0  empagliflozin (JARDIANCE) 10 MG TABS tablet Take 1 tablet (10 mg total) by mouth daily. 30 tablet 2   furosemide (LASIX) 20 MG tablet Take 1 tablet (20 mg total) by mouth daily. 30 tablet 3   furosemide (LASIX) 40 MG tablet Take 1 tablet (40 mg total) by mouth daily. 30 tablet 3   furosemide (LASIX) 40 MG tablet Take 1 tablet (40 mg total) by mouth 2 (two) times daily. 16 tablet 1   glipiZIDE (GLIPIZIDE XL) 5 MG 24 hr tablet Take 1 tablet (5 mg total) by mouth daily with breakfast. 30 tablet 3   Glucose Blood (BLOOD GLUCOSE TEST STRIPS) STRP Use in the morning, at noon, and at bedtime. May substitute to any manufacturer covered by patient's insurance. 100 strip 2   insulin aspart  (FIASP FLEXTOUCH) 100 UNIT/ML FlexTouch Pen Before each meal 3 times a day, 140-199 - 2 units, 200-250 - 4 units, 251-299 - 6 units,  300-349 - 8 units,  350 or above 10 units. 9 mL 0   Insulin Pen Needle 32G X 4 MM MISC For 4 times a day insulin injection under the skin, 1 month supply. 100 each 0   losartan (COZAAR) 25 MG tablet Take 1 tablet (25 mg total) by mouth daily. 90 tablet 1   metoprolol tartrate (LOPRESSOR) 50 MG tablet TAKE 1 AND 1/2 TABLETS BY MOUTH TWO TIMES A DAY 90 tablet 0   mirtazapine (REMERON) 30 MG tablet TAKE 1 TABLET BY MOUTH EVERY NIGHT AT BEDTIME FOR APPETITE 90 tablet 1   pantoprazole (PROTONIX) 40 MG tablet Take 1 tablet (40 mg total) by mouth daily. 30 tablet 3   potassium chloride SA (KLOR-CON M) 20 MEQ tablet Take 1 tablet (20 mEq total) by mouth once for 1 dose. 7 tablet 0   simvastatin (ZOCOR) 40 MG tablet TAKE ONE TABLET BY MOUTH DAILY (Patient taking differently: Take 40 mg by mouth at bedtime.) 90 tablet 3   spironolactone (ALDACTONE) 25 MG tablet Take 0.5 tablets (12.5 mg total) by mouth daily. 90 tablet 3   Tocilizumab (ACTEMRA IV) Inject 6 mg/kg into the vein every 28 (twenty-eight) days. Receives at [facility]     No current facility-administered medications on file prior to visit.     No Known Allergies Social History   Socioeconomic History   Marital status: Married    Spouse name: Not on file   Number of children: 1   Years of education: Not on file   Highest education level: Not on file  Occupational History   Not on file  Tobacco Use   Smoking status: Former    Current packs/day: 0.00    Types: Cigarettes    Quit date: 11/04/1985    Years since quitting: 37.0   Smokeless tobacco: Never  Vaping Use   Vaping status: Never Used  Substance and Sexual Activity   Alcohol use: Not Currently    Alcohol/week: 2.0 - 3.0 standard drinks of alcohol    Types: 2 - 3 Cans of beer per week   Drug use: No   Sexual activity: Not on  file  Other Topics Concern   Not on file  Social History Narrative   Not on file   Social Determinants of Health   Financial Resource Strain: Low Risk  (08/24/2022)   Overall Financial Resource Strain (CARDIA)    Difficulty of Paying Living Expenses: Not hard at all  Food Insecurity: No Food Insecurity (08/24/2022)   Hunger Vital  Sign    Worried About Programme researcher, broadcasting/film/video in the Last Year: Never true    Ran Out of Food in the Last Year: Never true  Transportation Needs: No Transportation Needs (08/24/2022)   PRAPARE - Administrator, Civil Service (Medical): No    Lack of Transportation (Non-Medical): No  Physical Activity: Sufficiently Active (07/10/2021)   Exercise Vital Sign    Days of Exercise per Week: 5 days    Minutes of Exercise per Session: 30 min  Stress: No Stress Concern Present (08/24/2022)   [employer] of Occupational Health - Occupational Stress Questionnaire    Feeling of Stress : Only a little  Social Connections: Socially Integrated (07/22/2022)   Social Connection and Isolation Panel [NHANES]    Frequency of Communication with Friends and Family: More than three times a week    Frequency of Social Gatherings with Friends and Family: More than three times a week    Attends Religious Services: More than 4 times per year    Active Member of [employer] or Organizations: Yes    Attends Banker Meetings: More than 4 times per year    Marital Status: Married  Catering manager Violence: Not At Risk (08/13/2022)   Humiliation, Afraid, Rape, and Kick questionnaire    Fear of Current or Ex-Partner: No    Emotionally Abused: No    Physically Abused: No    Sexually Abused: No     Review of Systems  All other systems reviewed and are negative.      Objective:   Physical Exam Vitals reviewed.  Constitutional:      Appearance: He is well-developed.  Neck:     Thyroid: No thyromegaly.     Vascular: No JVD.  Cardiovascular:     Rate and  Rhythm: Normal rate. Rhythm irregular.     Heart sounds: Normal heart sounds. No murmur heard. Pulmonary:     Effort: Pulmonary effort is normal. No respiratory distress.     Breath sounds: Normal breath sounds. No wheezing or rales.  Chest:     Chest wall: No tenderness.  Abdominal:     General: Bowel sounds are normal. There is no distension.     Palpations: Abdomen is soft. There is no mass.     Tenderness: There is no abdominal tenderness. There is no guarding or rebound.  Musculoskeletal:     Cervical back: Neck supple.     Right lower leg: Edema present.     Left lower leg: Edema present.  Lymphadenopathy:     Cervical: No cervical adenopathy.  Skin:    Findings: Bruising and rash present.           Assessment & Plan:  Acute systolic congestive heart failure (HCC) Patient is clearly third spacing peripheral edema.  Therefore increasing diuretic is ineffective as the fluid is no longer in the intravascular space.  Recommended putting the patient in Unna boots bilaterally which..  Continue Lasix 40 mg twice daily and recheck on Thursday.  At that point we will repeat BMP to monitor electrolytes and likely increase dose of spironolactone.

## 2022-11-16 ENCOUNTER — Emergency Department (HOSPITAL_COMMUNITY)
Admission: EM | Admit: 2022-11-16 | Discharge: 2022-11-16 | Disposition: A | Discharge status: Home / Self Care | Payer: Medicare Other | Attending: Emergency Medicine | Admitting: Emergency Medicine

## 2022-11-16 ENCOUNTER — Emergency Department (HOSPITAL_COMMUNITY): Payer: Medicare Other

## 2022-11-16 ENCOUNTER — Telehealth: Payer: Self-pay

## 2022-11-16 ENCOUNTER — Encounter (HOSPITAL_COMMUNITY): Payer: Self-pay

## 2022-11-16 DIAGNOSIS — Z951 Presence of aortocoronary bypass graft: Secondary | ICD-10-CM | POA: Diagnosis not present

## 2022-11-16 DIAGNOSIS — E876 Hypokalemia: Secondary | ICD-10-CM | POA: Diagnosis not present

## 2022-11-16 DIAGNOSIS — Z7901 Long term (current) use of anticoagulants: Secondary | ICD-10-CM | POA: Diagnosis not present

## 2022-11-16 DIAGNOSIS — R29818 Other symptoms and signs involving the nervous system: Secondary | ICD-10-CM | POA: Diagnosis not present

## 2022-11-16 DIAGNOSIS — Z794 Long term (current) use of insulin: Secondary | ICD-10-CM | POA: Insufficient documentation

## 2022-11-16 DIAGNOSIS — H539 Unspecified visual disturbance: Secondary | ICD-10-CM | POA: Diagnosis not present

## 2022-11-16 DIAGNOSIS — I2510 Atherosclerotic heart disease of native coronary artery without angina pectoris: Secondary | ICD-10-CM | POA: Insufficient documentation

## 2022-11-16 DIAGNOSIS — R4182 Altered mental status, unspecified: Secondary | ICD-10-CM | POA: Diagnosis not present

## 2022-11-16 DIAGNOSIS — E119 Type 2 diabetes mellitus without complications: Secondary | ICD-10-CM | POA: Insufficient documentation

## 2022-11-16 DIAGNOSIS — H538 Other visual disturbances: Secondary | ICD-10-CM | POA: Diagnosis not present

## 2022-11-16 DIAGNOSIS — Z7984 Long term (current) use of oral hypoglycemic drugs: Secondary | ICD-10-CM | POA: Diagnosis not present

## 2022-11-16 LAB — CBC
HCT: 35.8 % — ABNORMAL LOW (ref 39.0–52.0)
Hemoglobin: 11.2 g/dL — ABNORMAL LOW (ref 13.0–17.0)
MCH: 32.5 pg (ref 26.0–34.0)
MCHC: 31.3 g/dL (ref 30.0–36.0)
MCV: 103.8 fL — ABNORMAL HIGH (ref 80.0–100.0)
Platelets: 122 10*3/uL — ABNORMAL LOW (ref 150–400)
RBC: 3.45 MIL/uL — ABNORMAL LOW (ref 4.22–5.81)
RDW: 16.3 % — ABNORMAL HIGH (ref 11.5–15.5)
WBC: 23.5 10*3/uL — ABNORMAL HIGH (ref 4.0–10.5)
nRBC: 0.2 % (ref 0.0–0.2)

## 2022-11-16 LAB — CBG MONITORING, ED
Glucose-Capillary: 61 mg/dL — ABNORMAL LOW (ref 70–99)
Glucose-Capillary: 136 mg/dL — ABNORMAL HIGH (ref 70–99)

## 2022-11-16 LAB — PROTIME-INR
INR: 1.9 — ABNORMAL HIGH (ref 0.8–1.2)
Prothrombin Time: 21.8 s — ABNORMAL HIGH (ref 11.4–15.2)

## 2022-11-16 LAB — DIFFERENTIAL
Abs Immature Granulocytes: 0 10*3/uL (ref 0.00–0.07)
Basophils Absolute: 0.2 10*3/uL — ABNORMAL HIGH (ref 0.0–0.1)
Basophils Relative: 1 %
Eosinophils Absolute: 0.2 10*3/uL (ref 0.0–0.5)
Eosinophils Relative: 1 %
Lymphocytes Relative: 7 %
Lymphs Abs: 1.6 10*3/uL (ref 0.7–4.0)
Monocytes Absolute: 0.5 10*3/uL (ref 0.1–1.0)
Monocytes Relative: 2 %
Neutro Abs: 20.9 10*3/uL — ABNORMAL HIGH (ref 1.7–7.7)
Neutrophils Relative %: 89 %
nRBC: 0 /100{WBCs}

## 2022-11-16 LAB — I-STAT CHEM 8, ED
BUN: 18 mg/dL (ref 8–23)
Calcium, Ion: 1.16 mmol/L (ref 1.15–1.40)
Chloride: 101 mmol/L (ref 98–111)
Creatinine, Ser: 1.6 mg/dL — ABNORMAL HIGH (ref 0.61–1.24)
Glucose, Bld: 57 mg/dL — ABNORMAL LOW (ref 70–99)
HCT: 34 % — ABNORMAL LOW (ref 39.0–52.0)
Hemoglobin: 11.6 g/dL — ABNORMAL LOW (ref 13.0–17.0)
Potassium: 3.1 mmol/L — ABNORMAL LOW (ref 3.5–5.1)
Sodium: 141 mmol/L (ref 135–145)
TCO2: 29 mmol/L (ref 22–32)

## 2022-11-16 LAB — COMPREHENSIVE METABOLIC PANEL
ALT: 26 U/L (ref 0–44)
AST: 20 U/L (ref 15–41)
Albumin: 3.3 g/dL — ABNORMAL LOW (ref 3.5–5.0)
Alkaline Phosphatase: 47 U/L (ref 38–126)
Anion gap: 12 (ref 5–15)
BUN: 16 mg/dL (ref 8–23)
CO2: 27 mmol/L (ref 22–32)
Calcium: 8.9 mg/dL (ref 8.9–10.3)
Chloride: 104 mmol/L (ref 98–111)
Creatinine, Ser: 1.67 mg/dL — ABNORMAL HIGH (ref 0.61–1.24)
GFR, Estimated: 41 mL/min — ABNORMAL LOW (ref >60)
Glucose, Bld: 60 mg/dL — ABNORMAL LOW (ref 70–99)
Potassium: 3.1 mmol/L — ABNORMAL LOW (ref 3.5–5.1)
Sodium: 143 mmol/L (ref 135–145)
Total Bilirubin: 1.6 mg/dL — ABNORMAL HIGH (ref <1.2)
Total Protein: 5.1 g/dL — ABNORMAL LOW (ref 6.5–8.1)

## 2022-11-16 LAB — APTT: aPTT: 34 s (ref 24–36)

## 2022-11-16 LAB — ETHANOL: Alcohol, Ethyl (B): <10 mg/dL (ref <10)

## 2022-11-16 MED ORDER — SODIUM CHLORIDE 0.9% FLUSH: 3.0000 mL | Freq: Once | INTRAVENOUS | Status: DC

## 2022-11-16 MED ORDER — METHYLPREDNISOLONE SODIUM SUCC 125 MG IJ SOLR
125.0000 mg | Freq: Once | INTRAMUSCULAR | Status: AC
  Administered 2022-11-16: 125 mg via INTRAVENOUS
  Filled 2022-11-16: qty 2

## 2022-11-16 NOTE — ED Provider Notes (Signed)
[place] EMERGENCY DEPARTMENT AT [facility] Provider Note   CSN: [id number] Arrival date & time: 11/16/22  1203     History  No chief complaint on file.   MCCLAIN SHALL is a 82 y.o. male.  82 y.o. 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  Patient reports since the episode in August he has had no vision in his left eye at all and even after leaving the hospital and while in the hospital he had floaters in some blurry vision in the right eye which he describes as kind of a film over his eye.  This has been stable but today he reports that he went to vote and he almost completely lost vision in his right eye.  He reports it was very very blurry and he could only make out some shapes.  During that time he had no pain in his eye or headache.  He did feel that the sun light made it worse.  He called the rheumatologist who recommended he come to the emergency room.  He reports now his vision seems almost completely back to normal it might be a little bit more blurry than what it had been and he still has the floaters.  He continues to deny any jaw pain, headaches or eye pain.  He has recently decreased his prednisone to 20 mg daily from 25 last week.  He has not had any fevers or congestion.  He reports the biggest issue has been ongoing swelling in his legs since August when he was diagnosed with the giant cell arteritis placed on the steroids and also found to be in A-fib.  He recently had Unna boots placed to yesterday but continues to deny any chest pain or shortness of breath.  The history is provided by the patient, the spouse and medical records.       Home Medications Prior to Admission  medications   Medication Sig Start Date End Date Taking? Authorizing Provider  allopurinol (ZYLOPRIM) 300 MG tablet TAKE 1 TABLET BY MOUTH DAILY 11/01/22   [name], MD  apixaban (ELIQUIS) 5 MG TABS tablet Take 1 tablet (5 mg total) by mouth 2 (two) times daily. 09/20/22   [name], MD  Blood Glucose Monitoring Suppl (BLOOD GLUCOSE MONITOR SYSTEM) w/Device KIT 1 each by Does not apply route in the morning, at noon, and at bedtime. 08/16/22   Leroy Sea, MD  cephALEXin (KEFLEX) 500 MG capsule Take 1 capsule (500 mg total) by mouth 3 (three) times daily. 10/22/22   [name], MD  doxazosin (CARDURA) 2 MG tablet TAKE 1 TABLET BY MOUTH DAILY Patient taking differently: Take 2 mg by mouth at bedtime. 08/04/22   [name], MD  doxycycline (VIBRA-TABS) 100 MG tablet Take 1 tablet (100 mg total) by mouth 2 (two) times daily. 10/22/22   [name], MD  empagliflozin (JARDIANCE) 10 MG TABS tablet Take 1 tablet (10 mg total) by mouth daily. 08/16/22   Arty Baumgartner, NP  furosemide (LASIX) 20 MG tablet Take 1 tablet (20 mg total) by mouth daily. 09/02/22   [name], MD  furosemide (LASIX) 40 MG tablet Take 1 tablet (40 mg  total) by mouth daily. 09/20/22   [name], MD  furosemide (LASIX) 40 MG tablet Take 1 tablet (40 mg total) by mouth 2 (two) times daily. 11/11/22   [name], MD  glipiZIDE (GLIPIZIDE XL) 5 MG 24 hr tablet Take 1 tablet (5 mg total) by mouth daily with breakfast. 09/20/22   [name], MD  Glucose Blood (BLOOD GLUCOSE TEST STRIPS) STRP Use in the morning, at noon, and at bedtime. May substitute to any manufacturer covered by patient's insurance. 09/02/22   [name], MD  insulin aspart (FIASP FLEXTOUCH) 100 UNIT/ML FlexTouch Pen Before each meal 3 times a day, 140-199 - 2 units, 200-250 - 4 units, 251-299 - 6 units,  300-349 - 8 units,  350 or above 10 units. 08/16/22   Leroy Sea, MD  Insulin Pen Needle 32G  X 4 MM MISC For 4 times a day insulin injection under the skin, 1 month supply. 08/16/22   Leroy Sea, MD  losartan (COZAAR) 25 MG tablet Take 1 tablet (25 mg total) by mouth daily. 08/16/22   Arty Baumgartner, NP  metoprolol tartrate (LOPRESSOR) 50 MG tablet TAKE 1 AND 1/2 TABLETS BY MOUTH TWO TIMES A DAY 09/03/22   [name], MD  mirtazapine (REMERON) 30 MG tablet TAKE 1 TABLET BY MOUTH EVERY NIGHT AT BEDTIME FOR APPETITE 09/02/22   [name], MD  pantoprazole (PROTONIX) 40 MG tablet Take 1 tablet (40 mg total) by mouth daily. 07/30/22   [name], MD  potassium chloride SA (KLOR-CON M) 20 MEQ tablet Take 1 tablet (20 mEq total) by mouth once for 1 dose. 11/11/22 11/11/22  [name], MD  simvastatin (ZOCOR) 40 MG tablet TAKE ONE TABLET BY MOUTH DAILY Patient taking differently: Take 40 mg by mouth at bedtime. 05/17/22   [name], MD  spironolactone (ALDACTONE) 25 MG tablet Take 0.5 tablets (12.5 mg total) by mouth daily. 08/23/22 11/21/22  Azalee Course, PA  Tocilizumab (ACTEMRA IV) Inject 6 mg/kg into the vein every 28 (twenty-eight) days. Receives at [facility]    Fuller Plan, MD      Allergies    Patient has no known allergies.    Review of Systems   Review of Systems  Physical Exam Updated Vital Signs BP (!) 115/46 (BP Location: Left Arm)   Pulse 64   Temp 98.6 F (37 C)   Resp 16   Ht 5\' 8"  (1.727 m)   Wt 72.6 kg   SpO2 99%   BMI 24.34 kg/m  Physical Exam Vitals and nursing note reviewed.  Constitutional:      General: He is not in acute distress.    Appearance: He is well-developed.  HENT:     Head: Normocephalic and atraumatic.     Comments: No temporal artery pain or neck pain. Eyes:     Conjunctiva/sclera: Conjunctivae normal.     Comments: Left pupil is nonreactive to light but does react to consensual light.  Right pupil is 4 mm and briskly reactive to light.  Patient can differentiate light and dark.   He can differentiate the amount of fingers I am holding up in front of his face.  Extraocular movements are intact.  No drainage from the eye or injection noted.  Cardiovascular:     Rate and Rhythm: Normal rate. Rhythm irregularly irregular.     Pulses: Normal pulses.     Heart sounds: No murmur  heard. Pulmonary:     Effort: Pulmonary effort is normal. No respiratory distress.     Breath sounds: Normal breath sounds. No wheezing or rales.  Abdominal:     General: There is no distension.     Palpations: Abdomen is soft.     Tenderness: There is no abdominal tenderness. There is no guarding or rebound.  Musculoskeletal:        General: No tenderness. Normal range of motion.     Cervical back: Normal range of motion and neck supple.     Comments: Thin skin with various ecchymosis over the upper extremities in various stages of healing.  Significant edema present in bilateral lower extremities with Unna boots in place.  Capillary refill less than 3 seconds.  Skin:    General: Skin is warm and dry.     Findings: No erythema or rash.  Neurological:     Mental Status: He is alert and oriented to person, place, and time. Mental status is at baseline.  Psychiatric:        Mood and Affect: Mood normal.        Behavior: Behavior normal.     ED Results / Procedures / Treatments   Labs (all labs ordered are listed, but only abnormal results are displayed) Labs Reviewed  PROTIME-INR - Abnormal; Notable for the following components:      Result Value   Prothrombin Time 21.8 (*)    INR 1.9 (*)    All other components within normal limits  CBC - Abnormal; Notable for the following components:   WBC 23.5 (*)    RBC 3.45 (*)    Hemoglobin 11.2 (*)    HCT 35.8 (*)    MCV 103.8 (*)    RDW 16.3 (*)    Platelets 122 (*)    All other components within normal limits  DIFFERENTIAL - Abnormal; Notable for the following components:   Neutro Abs 20.9 (*)    Basophils Absolute 0.2 (*)    All other  components within normal limits  COMPREHENSIVE METABOLIC PANEL - Abnormal; Notable for the following components:   Potassium 3.1 (*)    Glucose, Bld 60 (*)    Creatinine, Ser 1.67 (*)    Total Protein 5.1 (*)    Albumin 3.3 (*)    Total Bilirubin 1.6 (*)    GFR, Estimated 41 (*)    All other components within normal limits  I-STAT CHEM 8, ED - Abnormal; Notable for the following components:   Potassium 3.1 (*)    Creatinine, Ser 1.60 (*)    Glucose, Bld 57 (*)    Hemoglobin 11.6 (*)    HCT 34.0 (*)    All other components within normal limits  CBG MONITORING, ED - Abnormal; Notable for the following components:   Glucose-Capillary 61 (*)    All other components within normal limits  CBG MONITORING, ED - Abnormal; Notable for the following components:   Glucose-Capillary 136 (*)    All other components within normal limits  APTT  ETHANOL    EKG None  Radiology CT HEAD WO CONTRAST  Result Date: 11/16/2022 CLINICAL DATA:  Neuro deficit, acute, stroke suspected EXAM: CT HEAD WITHOUT CONTRAST TECHNIQUE: Contiguous axial images were obtained from the base of the skull through the vertex without intravenous contrast. RADIATION DOSE REDUCTION: This exam was performed according to the departmental dose-optimization program which includes automated exposure control, adjustment of the mA and/or kV according to patient size and/or use of  iterative reconstruction technique. COMPARISON:  CT head 08/13/22 FINDINGS: Brain: No hemorrhage. No hydrocephalus. No extra-axial fluid collection. CT evidence of an acute cortical infarct. Chronic right basal ganglia infarct. No mass effect. No mass lesion. Vascular: No hyperdense vessel or unexpected calcification. Skull: Normal. Negative for fracture or focal lesion. Sinuses/Orbits: No middle ear mastoid effusion. Paranasal sinuses are clear. Bilateral lens replacement. Orbits are otherwise unremarkable. Other: None. IMPRESSION: 1. No hemorrhage or CT evidence  of an acute cortical infarct. 2. Chronic right basal ganglia infarct. Electronically Signed   By: Lorenza Cambridge M.D.   On: 11/16/2022 15:05    Procedures Procedures    Medications Ordered in ED Medications  sodium chloride flush (NS) 0.9 % injection 3 mL (3 mLs Intravenous Not Given 11/16/22 1440)  methylPREDNISolone sodium succinate (SOLU-MEDROL) 125 mg/2 mL injection 125 mg (125 mg Intravenous Given 11/16/22 1625)    ED Course/ Medical Decision Making/ A&P                                 Medical Decision Making Amount and/or Complexity of Data Reviewed Labs: ordered. Decision-making details documented in ED Course. Radiology: ordered and independent interpretation performed. Decision-making details documented in ED Course.  Risk Prescription drug management.   Pt with multiple medical problems and comorbidities and presenting today with a complaint that caries a high risk for morbidity and mortality.  Here today with change in vision in his right eye.  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  He does continue to take prednisone and started a new infusion a week and a half ago of actemra.  Patient reports now that his vision is almost back to its baseline.  He reports it may be slightly more fuzzy but is able to see me clearly and can differentiate fingers being held up in front of him.  He has no pain and has been compliant with his prednisone but did decrease the dose last week to 20 mg daily.  He denies any new infectious symptoms, feelings of near syncope, change in eating habits.  Patient's blood sugar was low initially when he arrived but he reports he had not eaten anything.  After eating blood sugar improved to 136.  Concern for possible worsening temporal arteritis versus electrolyte abnormalities.  Symptoms are not classic for retinal detachment.  I independently interpreted patient's labs.  CMP today  with stable creatinine of 1.67 which is slightly elevated from when he left the hospital at 1.18 but could be contributing to recent increase in Lasix therapy for his ongoing distal edema.  Also mild hypokalemia of 3.1 is most likely also related to this.  Will have him increase potassium in his diet.  CBC today with a white count of 23,000 and stable hemoglobin of 11 with a platelet count of 122.  Feel the leukocytosis could be coming from ongoing steroid use.  He denies any infectious symptoms at this time and is afebrile here.  He reports his blood pressure is usually between 1 10-1 15 and initially upon arrival here blood pressure was in the high 90s but repeat was 115 systolic.  Heart rate has been controlled but based on exam he remains in A-fib. I have independently visualized and interpreted pt's images today.  Head CT without evidence of bleed today.  Radiology reports no hemorrhage or acute cortical infarct  with chronic right basal ganglia infarct which was known.  Discussed the case with Dr. Ranae Pila patient's ophthalmologist and at this time he recommends giving a one-time IV dose of Solu-Medrol and having him increase his prednisone to 60 mg tomorrow and he will see him at the office at 815 to dilate his eye and look closer into his eye.  Discussed this with the patient and his family member who is present at bedside and they are comfortable with this plan.  He will need to have close follow-up with repeat blood work and exams.  He was given strict return precautions.         Final Clinical Impression(s) / ED Diagnoses Final diagnoses:  Vision changes  Hypokalemia    Rx / DC Orders ED Discharge Orders     None         Gwyneth Sprout, MD 11/16/22 1713

## 2022-11-16 NOTE — Telephone Encounter (Signed)
Pt called in to let pcp know that his right eye is still very blurred. Pt would like a cb from nurse as to what he should do please. Please advise.  Cb#: [phone]

## 2022-11-16 NOTE — Telephone Encounter (Signed)
Patient contacted the office and states the vision in his right eye is blurry. Patient states the blurriness in his right eye started this morning. Patient states he had already lost vision in his left eye back in August. Patient states he is going to go to the hospital and get steroids because he is concerned and does not want to lose the vision in his right eye. Patient states he would like a call back from Dr. Dimple Casey. Please advise.

## 2022-11-16 NOTE — ED Provider Triage Note (Signed)
Emergency Medicine Provider Triage Evaluation Note  Donald Rios , a 82 y.o. male  was evaluated in triage.  Pt complains of "gray fuzzy vision" that started this morning.  He reports that in sunlight he is unable to see out of either eye.  He has a history of blindness in left eye.  Review of Systems  Positive: Visual disturbance of right eye Negative: Fevers, chest pain, shortness of breath, paresthesia, weakness  Physical Exam  BP 96/84 (BP Location: Left Arm)   Pulse 92   Temp 98.6 F (37 C)   Resp 16   Ht 5\' 8"  (1.727 m)   Wt 72.6 kg   SpO2 100%   BMI 24.34 kg/m  Gen:   Awake, no distress   Resp:  Normal effort  MSK:   Moves extremities without difficulty  Other:    Medical Decision Making  Medically screening exam initiated at 1:11 PM.  Appropriate orders placed.  Donald Rios was informed that the remainder of the evaluation will be completed by another provider, this initial triage assessment does not replace that evaluation, and the importance of remaining in the ED until their evaluation is complete.  Lab work and imaging ordered   Donald Rios, [place] 11/16/22 1312

## 2022-11-16 NOTE — Telephone Encounter (Signed)
Attempted to contact the patient and left message on both the home phone and mobile phone to call the office back.

## 2022-11-16 NOTE — ED Triage Notes (Signed)
Soda, peanut butter, and crackers were given to pt. Blood sugar will be rechecked in 1h.

## 2022-11-16 NOTE — Telephone Encounter (Signed)
Attempted to return patient call left a voicemail on his home and mobile phone numbers.  I agree with getting checked out at the hospital if he has persistent blurring that is lasting for more than a few minutes since he needs an eye exam in that case which we cannot do here.

## 2022-11-16 NOTE — ED Triage Notes (Signed)
Pt states his right eye got fuzzy around 0900 today. Pt states when he is in the sunlight he can't see at all in that eye.

## 2022-11-16 NOTE — Discharge Instructions (Addendum)
Your potassium was a bit low today most likely from changing your Lasix dose.  Try eating some of these foods to increase your potassium levels.  Also you will need to have your blood work rechecked at Dr. Caren Macadam on Thursday as you are planning to see him about your legs.  You will see Dr. Randon Goldsmith tomorrow at 815 in his office.  Take 60mg  of prednisone tomorrow morning and then Dr. Randon Goldsmith will instruct you have to continue taking it.  If your vision worsens overnight before your appointment return to the emergency room.

## 2022-11-17 ENCOUNTER — Telehealth: Payer: Self-pay

## 2022-11-17 ENCOUNTER — Other Ambulatory Visit: Payer: Medicare Other

## 2022-11-17 DIAGNOSIS — H5310 Unspecified subjective visual disturbances: Secondary | ICD-10-CM | POA: Diagnosis not present

## 2022-11-17 NOTE — Transitions of Care (Post Inpatient/ED Visit) (Signed)
11/17/2022  Name: Donald Rios MRN: [id number] DOB: 10-11-1940  Today's TOC FU Call Status: Today's TOC FU Call Status:: Successful TOC FU Call Completed TOC FU Call Complete Date: 11/17/22 Patient's Name and Date of Birth confirmed.  Transition Care Management Follow-up Telephone Call Date of Discharge: 11/16/22 Discharge Facility: [facility]) Type of Discharge: Inpatient Admission Primary Inpatient Discharge Diagnosis:: visual distrubance How have you been since you were released from the hospital?: Better Any questions or concerns?: No  Items Reviewed: Did you receive and understand the discharge instructions provided?: Yes Medications obtained,verified, and reconciled?: Yes (Medications Reviewed) Any new allergies since your discharge?: No Dietary orders reviewed?: Yes Do you have support at home?: Yes People in Home: spouse  Medications Reviewed Today: Medications Reviewed Today     Reviewed by Karena Addison, LPN (Licensed Practical Nurse) on 11/17/22 at 1423  Med List Status: <None>   Medication Order Taking? Sig Documenting Provider Last Dose Status Informant  allopurinol (ZYLOPRIM) 300 MG tablet [id number] No TAKE 1 TABLET BY MOUTH DAILY Pickard, Priscille Heidelberg, MD Taking Active   apixaban (ELIQUIS) 5 MG TABS tablet [id number] No Take 1 tablet (5 mg total) by mouth 2 (two) times daily. [name], MD Taking Active   Blood Glucose Monitoring Suppl (BLOOD GLUCOSE MONITOR SYSTEM) w/Device KIT [id number] No 1 each by Does not apply route in the morning, at noon, and at bedtime. Leroy Sea, MD Taking Active   cephALEXin [facility]) 500 MG capsule [id number] No Take 1 capsule (500 mg total) by mouth 3 (three) times daily. [name], MD Taking Active   doxazosin (CARDURA) 2 MG tablet [id number] No TAKE 1 TABLET BY MOUTH DAILY  Patient taking differently: Take 2 mg by mouth at bedtime.   [name], MD Taking Active Self, Pharmacy Records  doxycycline  (VIBRA-TABS) 100 MG tablet [id number] No Take 1 tablet (100 mg total) by mouth 2 (two) times daily. [name], MD Taking Active   empagliflozin (JARDIANCE) 10 MG TABS tablet [id number] No Take 1 tablet (10 mg total) by mouth daily. Arty Baumgartner, NP Taking Active   furosemide (LASIX) 20 MG tablet [id number] No Take 1 tablet (20 mg total) by mouth daily. [name], MD Taking Active   furosemide (LASIX) 40 MG tablet [id number] No Take 1 tablet (40 mg total) by mouth daily. [name], MD Taking Active   furosemide (LASIX) 40 MG tablet [id number] No Take 1 tablet (40 mg total) by mouth 2 (two) times daily. [name], MD Taking Active   glipiZIDE (GLIPIZIDE XL) 5 MG 24 hr tablet [id number] No Take 1 tablet (5 mg total) by mouth daily with breakfast. [name], MD Taking Active   Glucose Blood (BLOOD GLUCOSE TEST STRIPS) STRP [id number] No Use in the morning, at noon, and at bedtime. May substitute to any manufacturer covered by patient's insurance. [name], MD Taking Active   insulin aspart (FIASP FLEXTOUCH) 100 UNIT/ML FlexTouch Pen [id number] No Before each meal 3 times a day, 140-199 - 2 units, 200-250 - 4 units, 251-299 - 6 units,  300-349 - 8 units,  350 or above 10 units. Leroy Sea, MD Taking Active   Insulin Pen Needle 32G X 4 MM MISC [id number] No For 4 times a day insulin injection under the skin, 1 month supply. Leroy Sea, MD Taking Active   losartan (COZAAR) 25 MG tablet [id number] No Take 1 tablet (25 mg total) by mouth  daily. Arty Baumgartner, NP Taking Active   metoprolol tartrate (LOPRESSOR) 50 MG tablet [id number] No TAKE 1 AND 1/2 TABLETS BY MOUTH TWO TIMES A DAY [name], MD Taking Active   mirtazapine (REMERON) 30 MG tablet [id number] No TAKE 1 TABLET BY MOUTH EVERY NIGHT AT BEDTIME FOR APPETITE [name], MD Taking Active   pantoprazole (PROTONIX) 40 MG tablet [id number] No Take 1 tablet (40 mg total) by mouth  daily. [name], MD Taking Active Self, Pharmacy Records  potassium chloride SA (KLOR-CON M) 20 MEQ tablet [id number]  Take 1 tablet (20 mEq total) by mouth once for 1 dose. [name], MD  Expired 11/11/22 2359   simvastatin (ZOCOR) 40 MG tablet [id number] No TAKE ONE TABLET BY MOUTH DAILY  Patient taking differently: Take 40 mg by mouth at bedtime.   [name], MD Taking Active Self, Pharmacy Records  spironolactone (ALDACTONE) 25 MG tablet [id number] No Take 0.5 tablets (12.5 mg total) by mouth daily. Azalee Course, [place] Taking Active   Tocilizumab (ACTEMRA IV) [id number] No Inject 6 mg/kg into the vein every 28 (twenty-eight) days. Receives at [employer], MD Taking Active             Home Care and Equipment/Supplies: Were Home Health Services Ordered?: NA Any new equipment or medical supplies ordered?: NA  Functional Questionnaire: Do you need assistance with bathing/showering or dressing?: No Do you need assistance with meal preparation?: No Do you need assistance with eating?: No Do you have difficulty maintaining continence: No Do you need assistance with getting out of bed/getting out of a chair/moving?: No Do you have difficulty managing or taking your medications?: No  Follow up appointments reviewed: PCP Follow-up appointment confirmed?: Yes Date of PCP follow-up appointment?: 11/18/22 Follow-up Provider: [facility] Follow-up appointment confirmed?: Yes Date of Specialist follow-up appointment?: 11/17/22 Follow-Up Specialty Provider:: ophal Do you need transportation to your follow-up appointment?: No Do you understand care options if your condition(s) worsen?: Yes-patient verbalized understanding    SIGNATURE Karena Addison, LPN [facility] Nurse Health Advisor Direct Dial [phone]

## 2022-11-17 NOTE — Telephone Encounter (Signed)
Attempted to contact the patient and left message on both the home phone and mobile phone to call the office back.

## 2022-11-18 ENCOUNTER — Ambulatory Visit: Payer: Medicare Other | Admitting: Family Medicine

## 2022-11-18 VITALS — BP 118/72 | HR 107 | Ht 68.0 in | Wt 161.6 lb

## 2022-11-18 DIAGNOSIS — I5021 Acute systolic (congestive) heart failure: Secondary | ICD-10-CM | POA: Diagnosis not present

## 2022-11-18 NOTE — Progress Notes (Signed)
Subjective:    Patient ID: Donald Rios, male    DOB: [birth year], 82 y.o.   MRN: [id number]  11/15/22  Despite having increased his Lasix from 40 mg once a day to 40 mg twice a day, the patient presents with significant edema in both lower extremities.  He has +2 edema in his feet and in his ankles +1 edema to his knees.  He denies any shortness of breath or chest pain.  He has purpura on both legs due to the vascular congestion exacerbated by Eliquis. Wt Readings from Last 3 Encounters:  11/16/22 160 lb 0.9 oz (72.6 kg)  11/15/22 160 lb 2 oz (72.6 kg)  11/09/22 158 lb (71.7 kg)   At that time, my plan was: Patient is clearly third spacing peripheral edema.  Therefore increasing diuretic is ineffective as the fluid is no longer in the intravascular space.  Recommended putting the patient in Unna boots bilaterally which..  Continue Lasix 40 mg twice daily and recheck on Thursday.  At that point we will repeat BMP to monitor electrolytes and likely increase dose of spironolactone.  11/18/22 Patient continues to have profound +2 pitting edema in both feet.  His toes are swollen.  The dorsums of both feet are swollen.  There is a large vesicle on the top of his left foot just proximal to the third and fourth MTP joints.  Patient reports aching pain in both feet.  The swelling diminishes above the ankle. 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    History of MI (myocardial infarction)    History of nuclear stress test 08/07/2010   dipyridamole; EKG  negative for ischemia, low risk scan    Hyperlipidemia    Hyperplastic colon polyp 05/2008   Hypertension    Ischemic cardiomyopathy    EF 45%, with inferior wall motion abnormality    Myocardial infarction (HCC)    Valvular regurgitation    mitral and tricuspid (mild)   Past Surgical History:  Procedure Laterality Date   CARDIOVERSION N/A 08/16/2022   Procedure: CARDIOVERSION;  Surgeon: Jake Bathe, MD;  Location: [facility] INVASIVE CV LAB;  Service: Cardiovascular;  Laterality: N/A;   CORONARY ARTERY BYPASS GRAFT  2000   LIMA to LAD, free IMA to OM2, sequential graft to PLA & PLD   ESOPHAGOGASTRODUODENOSCOPY N/A 05/07/2013   Procedure: ESOPHAGOGASTRODUODENOSCOPY (EGD);  Surgeon: Iva Boop, MD;  Location: [facility] ENDOSCOPY;  Service: Endoscopy;  Laterality: N/A;   TEE WITHOUT CARDIOVERSION N/A 08/16/2022   Procedure: TRANSESOPHAGEAL ECHOCARDIOGRAM;  Surgeon: Jake Bathe, MD;  Location: [facility] INVASIVE CV LAB;  Service: Cardiovascular;  Laterality: N/A;   TRANSTHORACIC ECHOCARDIOGRAM  09/07/2010   EF 45-50%; LV systolic function mildly reduced; LA mildly dilated; mild-mod MR & mild-mod TR; aortic root sclerosis/calcification;    Current Outpatient Medications on File Prior to Visit  Medication Sig Dispense Refill   allopurinol (ZYLOPRIM) 300 MG tablet TAKE 1 TABLET BY MOUTH DAILY  90 tablet 0   apixaban (ELIQUIS) 5 MG TABS tablet Take 1 tablet (5 mg total) by mouth 2 (two) times daily. 60 tablet 5   Blood Glucose Monitoring Suppl (BLOOD GLUCOSE MONITOR SYSTEM) w/Device KIT 1 each by Does not apply route in the morning, at noon, and at bedtime. 1 kit 0   cephALEXin (KEFLEX) 500 MG capsule Take 1 capsule (500 mg total) by mouth 3 (three) times daily. 21 capsule 0   doxazosin (CARDURA) 2 MG tablet TAKE 1 TABLET BY MOUTH DAILY (Patient taking differently: Take 2 mg by mouth at bedtime.) 90 tablet 3   doxycycline (VIBRA-TABS) 100 MG tablet Take 1 tablet (100 mg total) by mouth 2 (two) times daily. 14  tablet 0   empagliflozin (JARDIANCE) 10 MG TABS tablet Take 1 tablet (10 mg total) by mouth daily. 30 tablet 2   furosemide (LASIX) 20 MG tablet Take 1 tablet (20 mg total) by mouth daily. 30 tablet 3   furosemide (LASIX) 40 MG tablet Take 1 tablet (40 mg total) by mouth daily. 30 tablet 3   furosemide (LASIX) 40 MG tablet Take 1 tablet (40 mg total) by mouth 2 (two) times daily. 16 tablet 1   glipiZIDE (GLIPIZIDE XL) 5 MG 24 hr tablet Take 1 tablet (5 mg total) by mouth daily with breakfast. 30 tablet 3   Glucose Blood (BLOOD GLUCOSE TEST STRIPS) STRP Use in the morning, at noon, and at bedtime. May substitute to any manufacturer covered by patient's insurance. 100 strip 2   insulin aspart (FIASP FLEXTOUCH) 100 UNIT/ML FlexTouch Pen Before each meal 3 times a day, 140-199 - 2 units, 200-250 - 4 units, 251-299 - 6 units,  300-349 - 8 units,  350 or above 10 units. 9 mL 0   Insulin Pen Needle 32G X 4 MM MISC For 4 times a day insulin injection under the skin, 1 month supply. 100 each 0   losartan (COZAAR) 25 MG tablet Take 1 tablet (25 mg total) by mouth daily. 90 tablet 1   metoprolol tartrate (LOPRESSOR) 50 MG tablet TAKE 1 AND 1/2 TABLETS BY MOUTH TWO TIMES A DAY 90 tablet 0   mirtazapine (REMERON) 30 MG tablet TAKE 1 TABLET BY MOUTH EVERY NIGHT AT BEDTIME FOR APPETITE 90 tablet 1   pantoprazole (PROTONIX) 40 MG tablet Take 1 tablet (40 mg total) by mouth daily. 30 tablet 3   potassium chloride SA (KLOR-CON M) 20 MEQ tablet Take 1 tablet (20 mEq total) by mouth once for 1 dose. 7 tablet 0   simvastatin (ZOCOR) 40 MG tablet TAKE ONE TABLET BY MOUTH DAILY (Patient taking differently: Take 40 mg by mouth at bedtime.) 90 tablet 3   spironolactone (ALDACTONE) 25 MG tablet Take 0.5 tablets (12.5 mg total) by mouth daily. 90 tablet 3   Tocilizumab (ACTEMRA IV) Inject 6 mg/kg into the vein every 28 (twenty-eight) days. Receives at [employer]     No current facility-administered  medications on file prior to visit.     No Known Allergies Social History   Socioeconomic History   Marital status: Married    Spouse name: Not on file   Number of children: 1   Years of education: Not on file   Highest education level: Not on file  Occupational History   Not on file  Tobacco Use   Smoking status: Former    Current packs/day: 0.00    Types: Cigarettes    Quit date: 11/04/1985  Years since quitting: 37.0   Smokeless tobacco: Never  Vaping Use   Vaping status: Never Used  Substance and Sexual Activity   Alcohol use: Not Currently    Alcohol/week: 2.0 - 3.0 standard drinks of alcohol    Types: 2 - 3 Cans of beer per week   Drug use: No   Sexual activity: Not on file  Other Topics Concern   Not on file  Social History Narrative   Not on file   Social Determinants of Health   Financial Resource Strain: Low Risk  (08/24/2022)   Overall Financial Resource Strain (CARDIA)    Difficulty of Paying Living Expenses: Not hard at all  Food Insecurity: No Food Insecurity (08/24/2022)   Hunger Vital Sign    Worried About Running Out of Food in the Last Year: Never true    Ran Out of Food in the Last Year: Never true  Transportation Needs: No Transportation Needs (08/24/2022)   PRAPARE - Administrator, Civil Service (Medical): No    Lack of Transportation (Non-Medical): No  Physical Activity: Sufficiently Active (07/10/2021)   Exercise Vital Sign    Days of Exercise per Week: 5 days    Minutes of Exercise per Session: 30 min  Stress: No Stress Concern Present (08/24/2022)   [employer] of Occupational Health - Occupational Stress Questionnaire    Feeling of Stress : Only a little  Social Connections: Socially Integrated (07/22/2022)   Social Connection and Isolation Panel [NHANES]    Frequency of Communication with Friends and Family: More than three times a week    Frequency of Social Gatherings with Friends and Family: More than three times a  week    Attends Religious Services: More than 4 times per year    Active Member of [employer] or Organizations: Yes    Attends Banker Meetings: More than 4 times per year    Marital Status: Married  Catering manager Violence: Not At Risk (08/13/2022)   Humiliation, Afraid, Rape, and Kick questionnaire    Fear of Current or Ex-Partner: No    Emotionally Abused: No    Physically Abused: No    Sexually Abused: No     Review of Systems  All other systems reviewed and are negative.      Objective:   Physical Exam Vitals reviewed.  Constitutional:      Appearance: He is well-developed.  Neck:     Thyroid: No thyromegaly.     Vascular: No JVD.  Cardiovascular:     Rate and Rhythm: Normal rate. Rhythm irregular.     Heart sounds: Normal heart sounds. No murmur heard. Pulmonary:     Effort: Pulmonary effort is normal. No respiratory distress.     Breath sounds: Normal breath sounds. No wheezing or rales.  Chest:     Chest wall: No tenderness.  Abdominal:     General: Bowel sounds are normal. There is no distension.     Palpations: Abdomen is soft. There is no mass.     Tenderness: There is no abdominal tenderness. There is no guarding or rebound.  Musculoskeletal:     Cervical back: Neck supple.     Right lower leg: Edema present.     Left lower leg: Edema present.  Lymphadenopathy:     Cervical: No cervical adenopathy.  Skin:    Findings: Bruising and rash present.           Assessment & Plan:  Acute systolic congestive heart  failure (HCC) Continue Lasix at current doses.  Increase spironolactone to 25 mg twice daily.  Unna boots were replaced bilaterally.  Recheck in the morning and check renal function at that time.  Consider home SCD's

## 2022-11-18 NOTE — Telephone Encounter (Signed)
I called patient, patient did go to [facility] and Dr. Deatra Trig

## 2022-11-19 ENCOUNTER — Ambulatory Visit: Payer: Medicare Other | Admitting: Family Medicine

## 2022-11-19 ENCOUNTER — Ambulatory Visit (INDEPENDENT_AMBULATORY_CARE_PROVIDER_SITE_OTHER): Payer: Medicare Other | Admitting: Family Medicine

## 2022-11-19 VITALS — BP 118/64 | HR 120 | Temp 97.8°F | Ht 68.0 in | Wt 157.6 lb

## 2022-11-19 DIAGNOSIS — I5021 Acute systolic (congestive) heart failure: Secondary | ICD-10-CM

## 2022-11-19 NOTE — Progress Notes (Signed)
Subjective:    Patient ID: Donald Rios, male    DOB: 1940-06-22, 82 y.o.   MRN: [id number]  11/15/22  Despite having increased his Lasix from 40 mg once a day to 40 mg twice a day, the patient presents with significant edema in both lower extremities.  He has +2 edema in his feet and in his ankles +1 edema to his knees.  He denies any shortness of breath or chest pain.  He has purpura on both legs due to the vascular congestion exacerbated by Eliquis. Wt Readings from Last 3 Encounters:  11/19/22 157 lb 9.6 oz (71.5 kg)  11/18/22 161 lb 9.6 oz (73.3 kg)  11/16/22 160 lb 0.9 oz (72.6 kg)   At that time, my plan was: Patient is clearly third spacing peripheral edema.  Therefore increasing diuretic is ineffective as the fluid is no longer in the intravascular space.  Recommended putting the patient in Unna boots bilaterally which..  Continue Lasix 40 mg twice daily and recheck on Thursday.  At that point we will repeat BMP to monitor electrolytes and likely increase dose of spironolactone.  11/18/22 Patient continues to have profound +2 pitting edema in both feet.  His toes are swollen.  The dorsums of both feet are swollen.  There is a large vesicle on the top of his left foot just proximal to the third and fourth MTP joints.  Patient reports aching pain in both feet.  The swelling diminishes above the ankle.  At that time, my plan was: Continue Lasix at current doses.  Increase spironolactone to 25 mg twice daily.  Unna boots were replaced bilaterally.  Recheck in the morning and check renal function at that time.  Consider home SCD's  11/19/22 Swelling has improved and the patient's feet.  He still has pitting edema at a +1 level in both feet.  The swelling in his shins to his knees has not improved.  However the una boots were extremely painful for him. 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    History of MI (myocardial infarction)    History of nuclear stress test 08/07/2010   dipyridamole; EKG negative for ischemia, low risk scan    Hyperlipidemia    Hyperplastic colon polyp 05/2008   Hypertension    Ischemic cardiomyopathy    EF 45%, with inferior wall motion abnormality    Myocardial infarction (HCC)    Valvular regurgitation    mitral and tricuspid (mild)   Past Surgical History:  Procedure Laterality Date   CARDIOVERSION N/A 08/16/2022   Procedure: CARDIOVERSION;  Surgeon: Jake Bathe, MD;  Location: [facility];  Service: Cardiovascular;  Laterality: N/A;   CORONARY ARTERY BYPASS GRAFT  2000   LIMA to LAD, free IMA to OM2, sequential graft to PLA & PLD   ESOPHAGOGASTRODUODENOSCOPY N/A 05/07/2013   Procedure: ESOPHAGOGASTRODUODENOSCOPY (EGD);  Surgeon: Iva Boop, MD;  Location: [facility];  Service: Endoscopy;  Laterality: N/A;   TEE WITHOUT CARDIOVERSION N/A 08/16/2022   Procedure: TRANSESOPHAGEAL ECHOCARDIOGRAM;  Surgeon: Jake Bathe, MD;  Location: [facility];  Service: Cardiovascular;  Laterality: N/A;   TRANSTHORACIC ECHOCARDIOGRAM  09/07/2010   EF 45-50%; LV systolic function mildly reduced; LA mildly dilated; mild-mod MR & mild-mod TR; aortic root sclerosis/calcification;    Current Outpatient Medications on File Prior to Visit  Medication Sig Dispense Refill   allopurinol (ZYLOPRIM) 300 MG tablet TAKE 1 TABLET BY MOUTH DAILY 90 tablet 0   apixaban (ELIQUIS) 5 MG TABS tablet Take 1 tablet (5 mg total) by mouth 2 (two) times daily. 60 tablet 5   Blood Glucose Monitoring Suppl (BLOOD GLUCOSE  MONITOR SYSTEM) w/Device KIT 1 each by Does not apply route in the morning, at noon, and at bedtime. 1 kit 0   cephALEXin (KEFLEX) 500 MG capsule Take 1 capsule (500 mg total) by mouth 3 (three) times daily. 21 capsule 0   doxazosin (CARDURA) 2 MG tablet TAKE 1 TABLET BY MOUTH DAILY (Patient taking differently: Take 2 mg by mouth at bedtime.) 90 tablet 3   doxycycline (VIBRA-TABS) 100 MG tablet Take 1 tablet (100 mg total) by mouth 2 (two) times daily. 14 tablet 0   empagliflozin (JARDIANCE) 10 MG TABS tablet Take 1 tablet (10 mg total) by mouth daily. 30 tablet 2   furosemide (LASIX) 20 MG tablet Take 1 tablet (20 mg total) by mouth daily. 30 tablet 3   furosemide (LASIX) 40 MG tablet Take 1 tablet (40 mg total) by mouth daily. 30 tablet 3   furosemide (LASIX) 40 MG tablet Take 1 tablet (40 mg total) by mouth 2 (two) times daily. 16 tablet 1   glipiZIDE (GLIPIZIDE XL) 5 MG 24 hr tablet Take 1 tablet (5 mg total) by mouth daily with breakfast. 30 tablet 3   Glucose Blood (BLOOD GLUCOSE TEST STRIPS) STRP Use in the morning, at noon, and at bedtime. May substitute to any manufacturer covered by patient's insurance. 100 strip 2   insulin aspart (FIASP FLEXTOUCH) 100 UNIT/ML FlexTouch Pen Before each meal 3 times a day, 140-199 - 2 units, 200-250 - 4 units, 251-299 - 6 units,  300-349 - 8 units,  350 or above 10 units. 9 mL 0   Insulin Pen Needle 32G X 4 MM MISC For 4 times a day insulin injection under the skin, 1 month supply. 100 each 0   losartan (COZAAR) 25 MG tablet Take 1 tablet (25 mg total) by mouth daily. 90 tablet 1   metoprolol tartrate (LOPRESSOR) 50 MG tablet TAKE 1 AND 1/2 TABLETS BY MOUTH TWO TIMES A DAY 90 tablet 0   mirtazapine (REMERON) 30 MG tablet TAKE 1 TABLET BY MOUTH EVERY NIGHT AT BEDTIME FOR APPETITE 90 tablet 1   pantoprazole (PROTONIX) 40 MG tablet Take 1 tablet (40 mg total) by mouth daily. 30 tablet 3   potassium chloride SA (KLOR-CON M) 20 MEQ tablet Take 1 tablet (20 mEq  total) by mouth once for 1 dose. 7 tablet 0   simvastatin (ZOCOR) 40 MG tablet TAKE ONE TABLET BY MOUTH DAILY (Patient taking differently: Take 40 mg by mouth at bedtime.) 90 tablet 3   spironolactone (ALDACTONE) 25 MG tablet Take 0.5 tablets (12.5 mg total) by mouth daily. 90 tablet 3   Tocilizumab (ACTEMRA IV) Inject 6 mg/kg into the vein every 28 (twenty-eight) days. Receives at [facility]     No current facility-administered medications on  file prior to visit.     No Known Allergies Social History   Socioeconomic History   Marital status: Married    Spouse name: Not on file   Number of children: 1   Years of education: Not on file   Highest education level: Not on file  Occupational History   Not on file  Tobacco Use   Smoking status: Former    Current packs/day: 0.00    Types: Cigarettes    Quit date: 11/04/1985    Years since quitting: 37.0   Smokeless tobacco: Never  Vaping Use   Vaping status: Never Used  Substance and Sexual Activity   Alcohol use: Not Currently    Alcohol/week: 2.0 - 3.0 standard drinks of alcohol    Types: 2 - 3 Cans of beer per week   Drug use: No   Sexual activity: Not on file  Other Topics Concern   Not on file  Social History Narrative   Not on file   Social Determinants of Health   Financial Resource Strain: Low Risk  (08/24/2022)   Overall Financial Resource Strain (CARDIA)    Difficulty of Paying Living Expenses: Not hard at all  Food Insecurity: No Food Insecurity (08/24/2022)   Hunger Vital Sign    Worried About Running Out of Food in the Last Year: Never true    Ran Out of Food in the Last Year: Never true  Transportation Needs: No Transportation Needs (08/24/2022)   PRAPARE - Administrator, Civil Service (Medical): No    Lack of Transportation (Non-Medical): No  Physical Activity: Sufficiently Active (07/10/2021)   Exercise Vital Sign    Days of Exercise per Week: 5 days    Minutes of Exercise per  Session: 30 min  Stress: No Stress Concern Present (08/24/2022)   [employer] of Occupational Health - Occupational Stress Questionnaire    Feeling of Stress : Only a little  Social Connections: Socially Integrated (07/22/2022)   Social Connection and Isolation Panel [NHANES]    Frequency of Communication with Friends and Family: More than three times a week    Frequency of Social Gatherings with Friends and Family: More than three times a week    Attends Religious Services: More than 4 times per year    Active Member of [employer] or Organizations: Yes    Attends Banker Meetings: More than 4 times per year    Marital Status: Married  Catering manager Violence: Not At Risk (08/13/2022)   Humiliation, Afraid, Rape, and Kick questionnaire    Fear of Current or Ex-Partner: No    Emotionally Abused: No    Physically Abused: No    Sexually Abused: No     Review of Systems  All other systems reviewed and are negative.      Objective:   Physical Exam Vitals reviewed.  Constitutional:      Appearance: He is well-developed.  Neck:     Thyroid: No thyromegaly.     Vascular: No JVD.  Cardiovascular:     Rate and Rhythm: Normal rate. Rhythm irregular.     Heart sounds: Normal heart sounds. No murmur heard. Pulmonary:     Effort: Pulmonary effort is normal. No respiratory distress.     Breath sounds: Normal breath sounds. No wheezing or rales.  Chest:     Chest wall: No tenderness.  Abdominal:     General: Bowel sounds are normal. There is no distension.     Palpations: Abdomen  is soft. There is no mass.     Tenderness: There is no abdominal tenderness. There is no guarding or rebound.  Musculoskeletal:     Cervical back: Neck supple.     Right lower leg: Edema present.     Left lower leg: Edema present.  Lymphadenopathy:     Cervical: No cervical adenopathy.  Skin:    Findings: Bruising and rash present.           Assessment & Plan:  Acute systolic  congestive heart failure (HCC) - Plan: BASIC METABOLIC PANEL WITH GFR Recommended knee-high compression hose 15 to 20 mmHg.  Medicating spironolactone to 5 mL twice daily with the Lasix.  Check a BMP today.  Recheck BMP Wednesday next week to monitor renal function.  Recheck via telephone on Monday to see how the swelling is doing.

## 2022-11-20 ENCOUNTER — Other Ambulatory Visit: Payer: Self-pay | Admitting: Family Medicine

## 2022-11-20 LAB — BASIC METABOLIC PANEL WITHOUT GFR
BUN/Creatinine Ratio: 19 (calc) (ref 6–22)
BUN: 31 mg/dL — ABNORMAL HIGH (ref 7–25)
CO2: 27 mmol/L (ref 20–32)
Calcium: 8.8 mg/dL (ref 8.6–10.3)
Chloride: 105 mmol/L (ref 98–110)
Creat: 1.64 mg/dL — ABNORMAL HIGH (ref 0.70–1.22)
Glucose, Bld: 64 mg/dL — ABNORMAL LOW (ref 65–99)
Potassium: 3.5 mmol/L (ref 3.5–5.3)
Sodium: 144 mmol/L (ref 135–146)
eGFR: 42 mL/min/1.73m2 — ABNORMAL LOW

## 2022-11-22 NOTE — Telephone Encounter (Signed)
Requested medication (s) are due for refill today: yes  Requested medication (s) are on the active medication list: yes  Last refill:  07/30/22  Future visit scheduled: no  Notes to clinic:  unable to refill, routing for approval.

## 2022-11-23 ENCOUNTER — Ambulatory Visit (INDEPENDENT_AMBULATORY_CARE_PROVIDER_SITE_OTHER): Payer: Medicare Other | Admitting: Family Medicine

## 2022-11-23 ENCOUNTER — Encounter: Payer: Self-pay | Admitting: Family Medicine

## 2022-11-23 DIAGNOSIS — I5021 Acute systolic (congestive) heart failure: Secondary | ICD-10-CM | POA: Diagnosis not present

## 2022-11-23 MED ORDER — HYDROCODONE-ACETAMINOPHEN 5-325 MG PO TABS: 1.0000 | ORAL_TABLET | Freq: Four times a day (QID) | ORAL | 0 refills | Status: DC | PRN

## 2022-11-23 MED ORDER — SPIRONOLACTONE 25 MG PO TABS: 25.0000 mg | ORAL_TABLET | Freq: Two times a day (BID) | ORAL | 3 refills | Status: DC

## 2022-11-23 MED ORDER — FUROSEMIDE 40 MG PO TABS: 40.0000 mg | ORAL_TABLET | Freq: Two times a day (BID) | ORAL | 3 refills | Status: DC

## 2022-11-23 NOTE — Progress Notes (Signed)
Subjective:    Patient ID: Donald Rios, male    DOB: Feb 21, 1940, 82 y.o.   MRN: [id number]  11/15/22  Despite having increased his Lasix from 40 mg once a day to 40 mg twice a day, the patient presents with significant edema in both lower extremities.  He has +2 edema in his feet and in his ankles +1 edema to his knees.  He denies any shortness of breath or chest pain.  He has purpura on both legs due to the vascular congestion exacerbated by Eliquis. Wt Readings from Last 3 Encounters:  11/19/22 157 lb 9.6 oz (71.5 kg)  11/18/22 161 lb 9.6 oz (73.3 kg)  11/16/22 160 lb 0.9 oz (72.6 kg)   At that time, my plan was: Patient is clearly third spacing peripheral edema.  Therefore increasing diuretic is ineffective as the fluid is no longer in the intravascular space.  Recommended putting the patient in Unna boots bilaterally which..  Continue Lasix 40 mg twice daily and recheck on Thursday.  At that point we will repeat BMP to monitor electrolytes and likely increase dose of spironolactone.  11/18/22 Patient continues to have profound +2 pitting edema in both feet.  His toes are swollen.  The dorsums of both feet are swollen.  There is a large vesicle on the top of his left foot just proximal to the third and fourth MTP joints.  Patient reports aching pain in both feet.  The swelling diminishes above the ankle.  At that time, my plan was: Continue Lasix at current doses.  Increase spironolactone to 25 mg twice daily.  Unna boots were replaced bilaterally.  Recheck in the morning and check renal function at that time.  Consider home SCD's  11/19/22 Swelling has improved and the patient's feet.  He still has pitting edema at a +1 level in both feet.  The swelling in his shins to his knees has not improved.  However the una boots were extremely painful for him.  At that time, my plan was: Recommended knee-high compression hose 15 to 20 mmHg.  Continue spironolactone 25 mg twice daily with the Lasix.   Check a BMP today.  Recheck BMP Wednesday next week to monitor renal function.  Recheck via telephone on Monday to see how the swelling is doing.  11/23/22 Wt Readings from Last 3 Encounters:  11/23/22 165 lb 3.2 oz (74.9 kg)  11/19/22 157 lb 9.6 oz (71.5 kg)  11/18/22 161 lb 9.6 oz (73.3 kg)   Since stopping the Unna boot compression wraps, the patient has gained 8 pounds.  He has +2 pitting edema in both legs to his knees.  He is too weak to be able to put on his compression hose.  His wife is too weak to be able to help him put on the compression hose.  As result he has not been using any compression therapy.  Patient suggest fraction of 30%.  Currently on metoprolol losartan.  He is taking Lasix 40 mg once daily and spironolactone 25 mg twice daily. 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    History of MI (myocardial infarction)    History of nuclear stress test 08/07/2010   dipyridamole; EKG negative for ischemia, low risk scan    Hyperlipidemia    Hyperplastic colon polyp 05/2008   Hypertension    Ischemic cardiomyopathy    EF 45%, with inferior wall motion abnormality    Myocardial infarction (HCC)    Valvular regurgitation    mitral and tricuspid (mild)   Past Surgical History:  Procedure Laterality Date   CARDIOVERSION N/A 08/16/2022   Procedure: CARDIOVERSION;  Surgeon: Jake Bathe, MD;  Location: [facility];  Service: Cardiovascular;  Laterality: N/A;   CORONARY ARTERY BYPASS GRAFT  2000   LIMA to LAD, free IMA to OM2,  sequential graft to PLA & PLD   ESOPHAGOGASTRODUODENOSCOPY N/A 05/07/2013   Procedure: ESOPHAGOGASTRODUODENOSCOPY (EGD);  Surgeon: Iva Boop, MD;  Location: [facility] ENDOSCOPY;  Service: Endoscopy;  Laterality: N/A;   TEE WITHOUT CARDIOVERSION N/A 08/16/2022   Procedure: TRANSESOPHAGEAL ECHOCARDIOGRAM;  Surgeon: Jake Bathe, MD;  Location: [facility];  Service: Cardiovascular;  Laterality: N/A;   TRANSTHORACIC ECHOCARDIOGRAM  09/07/2010   EF 45-50%; LV systolic function mildly reduced; LA mildly dilated; mild-mod MR & mild-mod TR; aortic root sclerosis/calcification;    Current Outpatient Medications on File Prior to Visit  Medication Sig Dispense Refill   allopurinol (ZYLOPRIM) 300 MG tablet TAKE 1 TABLET BY MOUTH DAILY 90 tablet 0   apixaban (ELIQUIS) 5 MG TABS tablet Take 1 tablet (5 mg total) by mouth 2 (two) times daily. 60 tablet 5   Blood Glucose Monitoring Suppl (BLOOD GLUCOSE MONITOR SYSTEM) w/Device KIT 1 each by Does not apply route in the morning, at noon, and at bedtime. 1 kit 0   cephALEXin (KEFLEX) 500 MG capsule Take 1 capsule (500 mg total) by mouth 3 (three) times daily. 21 capsule 0   doxazosin (CARDURA) 2 MG tablet TAKE 1 TABLET BY MOUTH DAILY (Patient taking differently: Take 2 mg by mouth at bedtime.) 90 tablet 3   doxycycline (VIBRA-TABS) 100 MG tablet Take 1 tablet (100 mg total) by mouth 2 (two) times daily. 14 tablet 0   empagliflozin (JARDIANCE) 10 MG TABS tablet Take 1 tablet (10 mg total) by mouth daily. 30 tablet 2   furosemide (LASIX) 20 MG tablet Take 1 tablet (20 mg total) by mouth daily. 30 tablet 3   furosemide (LASIX) 40 MG tablet Take 1 tablet (40 mg total) by mouth daily. 30 tablet 3   furosemide (LASIX) 40 MG tablet Take 1 tablet (40 mg total) by mouth 2 (two) times daily. 16 tablet 1   glipiZIDE (GLIPIZIDE XL) 5 MG 24 hr tablet Take 1 tablet (5 mg total) by mouth daily with breakfast. 30 tablet 3   Glucose Blood (BLOOD GLUCOSE TEST STRIPS) STRP Use  in the morning, at noon, and at bedtime. May substitute to any manufacturer covered by patient's insurance. 100 strip 2   insulin aspart (FIASP FLEXTOUCH) 100 UNIT/ML FlexTouch Pen Before each meal 3 times a day, 140-199 - 2 units, 200-250 - 4 units, 251-299 - 6 units,  300-349 - 8 units,  350 or above 10 units. 9 mL 0   Insulin Pen Needle 32G X 4 MM MISC For 4 times a day insulin injection under the skin, 1 month supply. 100 each 0  losartan (COZAAR) 25 MG tablet Take 1 tablet (25 mg total) by mouth daily. 90 tablet 1   metoprolol tartrate (LOPRESSOR) 50 MG tablet TAKE 1 AND 1/2 TABLETS BY MOUTH TWO TIMES A DAY 90 tablet 0   mirtazapine (REMERON) 30 MG tablet TAKE 1 TABLET BY MOUTH EVERY NIGHT AT BEDTIME FOR APPETITE 90 tablet 1   pantoprazole (PROTONIX) 40 MG tablet Take 1 tablet (40 mg total) by mouth daily. 30 tablet 3   potassium chloride SA (KLOR-CON M) 20 MEQ tablet Take 1 tablet (20 mEq total) by mouth once for 1 dose. 7 tablet 0   simvastatin (ZOCOR) 40 MG tablet TAKE ONE TABLET BY MOUTH DAILY (Patient taking differently: Take 40 mg by mouth at bedtime.) 90 tablet 3   spironolactone (ALDACTONE) 25 MG tablet Take 0.5 tablets (12.5 mg total) by mouth daily. 90 tablet 3   Tocilizumab (ACTEMRA IV) Inject 6 mg/kg into the vein every 28 (twenty-eight) days. Receives at [facility]     No current facility-administered medications on file prior to visit.     No Known Allergies Social History   Socioeconomic History   Marital status: Married    Spouse name: Not on file   Number of children: 1   Years of education: Not on file   Highest education level: Not on file  Occupational History   Not on file  Tobacco Use   Smoking status: Former    Current packs/day: 0.00    Types: Cigarettes    Quit date: 11/04/1985    Years since quitting: 37.0   Smokeless tobacco: Never  Vaping Use   Vaping status: Never Used  Substance and Sexual Activity   Alcohol use: Not Currently     Alcohol/week: 2.0 - 3.0 standard drinks of alcohol    Types: 2 - 3 Cans of beer per week   Drug use: No   Sexual activity: Not on file  Other Topics Concern   Not on file  Social History Narrative   Not on file   Social Determinants of Health   Financial Resource Strain: Low Risk  (08/24/2022)   Overall Financial Resource Strain (CARDIA)    Difficulty of Paying Living Expenses: Not hard at all  Food Insecurity: No Food Insecurity (08/24/2022)   Hunger Vital Sign    Worried About Running Out of Food in the Last Year: Never true    Ran Out of Food in the Last Year: Never true  Transportation Needs: No Transportation Needs (08/24/2022)   PRAPARE - Administrator, Civil Service (Medical): No    Lack of Transportation (Non-Medical): No  Physical Activity: Sufficiently Active (07/10/2021)   Exercise Vital Sign    Days of Exercise per Week: 5 days    Minutes of Exercise per Session: 30 min  Stress: No Stress Concern Present (08/24/2022)   [employer] of Occupational Health - Occupational Stress Questionnaire    Feeling of Stress : Only a little  Social Connections: Socially Integrated (07/22/2022)   Social Connection and Isolation Panel [NHANES]    Frequency of Communication with Friends and Family: More than three times a week    Frequency of Social Gatherings with Friends and Family: More than three times a week    Attends Religious Services: More than 4 times per year    Active Member of [employer] or Organizations: Yes    Attends Banker Meetings: More than 4 times per year    Marital Status:  Married  Intimate Partner Violence: Not At Risk (08/13/2022)   Humiliation, Afraid, Rape, and Kick questionnaire    Fear of Current or Ex-Partner: No    Emotionally Abused: No    Physically Abused: No    Sexually Abused: No     Review of Systems  All other systems reviewed and are negative.      Objective:   Physical Exam Vitals reviewed.  Constitutional:       Appearance: He is well-developed.  Neck:     Thyroid: No thyromegaly.     Vascular: No JVD.  Cardiovascular:     Rate and Rhythm: Normal rate. Rhythm irregular.     Heart sounds: Normal heart sounds. No murmur heard. Pulmonary:     Effort: Pulmonary effort is normal. No respiratory distress.     Breath sounds: Normal breath sounds. No wheezing or rales.  Chest:     Chest wall: No tenderness.  Abdominal:     General: Bowel sounds are normal. There is no distension.     Palpations: Abdomen is soft. There is no mass.     Tenderness: There is no abdominal tenderness. There is no guarding or rebound.  Musculoskeletal:     Cervical back: Neck supple.     Right lower leg: Edema present.     Left lower leg: Edema present.  Lymphadenopathy:     Cervical: No cervical adenopathy.  Skin:    Findings: Bruising and rash present.          Assessment & Plan:  Acute systolic congestive heart failure (HCC) - Plan: furosemide (LASIX) 40 MG tablet, Brain natriuretic peptide, BASIC METABOLIC PANEL WITH GFR Patient appears fluid overloaded.  Increase Lasix to 40 mg twice daily.  Continue spironolactone 25 mg twice daily.  Continue metoprolol and losartan.  I believe the patient would be better on Entresto compared to losartan given his ejection fraction.  He has an appointment next week to see his cardiologist.  I will defer this decision to his cardiologist.  Monitor his potassium and renal function today with a BMP.  Strongly encouraged the patient to perform compression therapy at home.  However he is unable to get on his compression hose.  Therefore I will see if we get patient fitted for half leg bilateral SCDs.

## 2022-11-24 LAB — BASIC METABOLIC PANEL WITH GFR
BUN/Creatinine Ratio: 18 (calc) (ref 6–22)
BUN: 24 mg/dL (ref 7–25)
CO2: 28 mmol/L (ref 20–32)
Calcium: 8.4 mg/dL — ABNORMAL LOW (ref 8.6–10.3)
Chloride: 105 mmol/L (ref 98–110)
Creat: 1.35 mg/dL — ABNORMAL HIGH (ref 0.70–1.22)
Glucose, Bld: 128 mg/dL — ABNORMAL HIGH (ref 65–99)
Potassium: 3.6 mmol/L (ref 3.5–5.3)
Sodium: 142 mmol/L (ref 135–146)
eGFR: 53 mL/min/{1.73_m2} — ABNORMAL LOW (ref >=60)

## 2022-11-24 LAB — BRAIN NATRIURETIC PEPTIDE: Brain Natriuretic Peptide: 390 pg/mL — ABNORMAL HIGH (ref <100)

## 2022-11-24 NOTE — Telephone Encounter (Signed)
Spoke with patient about swelling. This is ongoing. PCP has seen him for this - labs done - BNP elevated. He has echo on 11/18. Offered  sooner visit with Hilty MD (cancellations for next week) - he declined as he has "so many visits right now." He has an appt scheduled on 12/9 with Hilty MD already

## 2022-11-25 ENCOUNTER — Ambulatory Visit: Payer: Medicare Other | Admitting: Family Medicine

## 2022-11-25 ENCOUNTER — Encounter: Payer: Self-pay | Admitting: Family Medicine

## 2022-11-25 VITALS — BP 130/76 | HR 109 | Temp 97.5°F | Ht 68.0 in | Wt 165.8 lb

## 2022-11-25 DIAGNOSIS — I5021 Acute systolic (congestive) heart failure: Secondary | ICD-10-CM | POA: Diagnosis not present

## 2022-11-25 MED ORDER — METOPROLOL TARTRATE 50 MG PO TABS: 100.0000 mg | ORAL_TABLET | Freq: Two times a day (BID) | ORAL | 3 refills | Status: DC

## 2022-11-25 NOTE — Progress Notes (Signed)
Subjective:    Patient ID: Donald Rios, male    DOB: December 20, 1940, 82 y.o.   MRN: [id number]  11/15/22  Despite having increased his Lasix from 40 mg once a day to 40 mg twice a day, the patient presents with significant edema in both lower extremities.  He has +2 edema in his feet and in his ankles +1 edema to his knees.  He denies any shortness of breath or chest pain.  He has purpura on both legs due to the vascular congestion exacerbated by Eliquis. Wt Readings from Last 3 Encounters:  11/25/22 165 lb 12.8 oz (75.2 kg)  11/23/22 165 lb 3.2 oz (74.9 kg)  11/19/22 157 lb 9.6 oz (71.5 kg)   At that time, my plan was: Patient is clearly third spacing peripheral edema.  Therefore increasing diuretic is ineffective as the fluid is no longer in the intravascular space.  Recommended putting the patient in Unna boots bilaterally which..  Continue Lasix 40 mg twice daily and recheck on Thursday.  At that point we will repeat BMP to monitor electrolytes and likely increase dose of spironolactone.  11/18/22 Patient continues to have profound +2 pitting edema in both feet.  His toes are swollen.  The dorsums of both feet are swollen.  There is a large vesicle on the top of his left foot just proximal to the third and fourth MTP joints.  Patient reports aching pain in both feet.  The swelling diminishes above the ankle.  At that time, my plan was: Continue Lasix at current doses.  Increase spironolactone to 25 mg twice daily.  Unna boots were replaced bilaterally.  Recheck in the morning and check renal function at that time.  Consider home SCD's  11/19/22 Swelling has improved and the patient's feet.  He still has pitting edema at a +1 level in both feet.  The swelling in his shins to his knees has not improved.  However the una boots were extremely painful for him.  At that time, my plan was: Recommended knee-high compression hose 15 to 20 mmHg.  Continue spironolactone 25 mg twice daily with the Lasix.   Check a BMP today.  Recheck BMP Wednesday next week to monitor renal function.  Recheck via telephone on Monday to see how the swelling is doing.  11/23/22 Wt Readings from Last 3 Encounters:  11/25/22 165 lb 12.8 oz (75.2 kg)  11/23/22 165 lb 3.2 oz (74.9 kg)  11/19/22 157 lb 9.6 oz (71.5 kg)   Since stopping the Unna boot compression wraps, the patient has gained 8 pounds.  He has +2 pitting edema in both legs to his knees.  He is too weak to be able to put on his compression hose.  His wife is too weak to be able to help him put on the compression hose.  As result he has not been using any compression therapy.  Patient suggest fraction of 30%.  Currently on metoprolol losartan.  He is taking Lasix 40 mg once daily and spironolactone 25 mg twice daily.  At that time, my plan was: Patient appears fluid overloaded.  Increase Lasix to 40 mg twice daily.  Continue spironolactone 25 mg twice daily.  Continue metoprolol and losartan.  I believe the patient would be better on Entresto compared to losartan given his ejection fraction.  He has an appointment next week to see his cardiologist.  I will defer this decision to his cardiologist.  Monitor his potassium and renal function today with  a BMP.  Strongly encouraged the patient to perform compression therapy at home.  However he is unable to get on his compression hose.  Therefore I will see if we get patient fitted for half leg bilateral SCDs.  11/25/22 Patient is here today with compression hose.  Neither he or his wife are able to put them on his legs because they are too swollen.  Therefore they are asking me to help put on the compression hose which I am happy to do.  His weight has not changed nor has the swelling.  He denies any chest pain or shortness of breath. Past Medical History:  Diagnosis Date   Acute gastric ulcer with hemorrhage 05/06/2013   Arthritis    Asthma    Atrial enlargement, left    CAD (coronary artery disease) of bypass  graft    Cataract    COPD (chronic obstructive pulmonary disease) (HCC)    Diabetes mellitus    Diabetic nephropathy (HCC)    Diverticulosis    ED (erectile dysfunction)    GERD (gastroesophageal reflux disease)    Gout    Hearing loss    Hiatal hernia 05/2008   EGD with HH and reflux esophagitis.    History of MI (myocardial infarction)    History of nuclear stress test 08/07/2010   dipyridamole; EKG negative for ischemia, low risk scan    Hyperlipidemia    Hyperplastic colon polyp 05/2008   Hypertension    Ischemic cardiomyopathy    EF 45%, with inferior wall motion abnormality    Myocardial infarction (HCC)    Valvular regurgitation    mitral and tricuspid (mild)   Past Surgical History:  Procedure Laterality Date   CARDIOVERSION N/A 08/16/2022   Procedure: CARDIOVERSION;  Surgeon: Jake Bathe, MD;  Location: [facility] INVASIVE [facility];  Service: Cardiovascular;  Laterality: N/A;   CORONARY ARTERY BYPASS GRAFT  2000   LIMA to LAD, free IMA to OM2, sequential graft to PLA & PLD   ESOPHAGOGASTRODUODENOSCOPY N/A 05/07/2013   Procedure: ESOPHAGOGASTRODUODENOSCOPY (EGD);  Surgeon: Iva Boop, MD;  Location: [facility] ENDOSCOPY;  Service: Endoscopy;  Laterality: N/A;   TEE WITHOUT CARDIOVERSION N/A 08/16/2022   Procedure: TRANSESOPHAGEAL ECHOCARDIOGRAM;  Surgeon: Jake Bathe, MD;  Location: [facility] INVASIVE CV LAB;  Service: Cardiovascular;  Laterality: N/A;   TRANSTHORACIC ECHOCARDIOGRAM  09/07/2010   EF 45-50%; LV systolic function mildly reduced; LA mildly dilated; mild-mod MR & mild-mod TR; aortic root sclerosis/calcification;    Current Outpatient Medications on File Prior to Visit  Medication Sig Dispense Refill   allopurinol (ZYLOPRIM) 300 MG tablet TAKE 1 TABLET BY MOUTH DAILY 90 tablet 0   apixaban (ELIQUIS) 5 MG TABS tablet Take 1 tablet (5 mg total) by mouth 2 (two) times daily. 60 tablet 5   Blood Glucose Monitoring Suppl (BLOOD GLUCOSE MONITOR SYSTEM) w/Device KIT 1 each by Does not  apply route in the morning, at noon, and at bedtime. 1 kit 0   cephALEXin (KEFLEX) 500 MG capsule Take 1 capsule (500 mg total) by mouth 3 (three) times daily. 21 capsule 0   doxazosin (CARDURA) 2 MG tablet TAKE 1 TABLET BY MOUTH DAILY (Patient taking differently: Take 2 mg by mouth at bedtime.) 90 tablet 3   doxycycline (VIBRA-TABS) 100 MG tablet Take 1 tablet (100 mg total) by mouth 2 (two) times daily. 14 tablet 0   empagliflozin (JARDIANCE) 10 MG TABS tablet Take 1 tablet (10 mg total) by mouth daily. 30 tablet 2  furosemide (LASIX) 40 MG tablet Take 1 tablet (40 mg total) by mouth 2 (two) times daily. 180 tablet 3   glipiZIDE (GLIPIZIDE XL) 5 MG 24 hr tablet Take 1 tablet (5 mg total) by mouth daily with breakfast. 30 tablet 3   Glucose Blood (BLOOD GLUCOSE TEST STRIPS) STRP Use in the morning, at noon, and at bedtime. May substitute to any manufacturer covered by patient's insurance. 100 strip 2   HYDROcodone-acetaminophen (NORCO) 5-325 MG tablet Take 1 tablet by mouth every 6 (six) hours as needed for moderate pain (pain score 4-6). 30 tablet 0   insulin aspart (FIASP FLEXTOUCH) 100 UNIT/ML FlexTouch Pen Before each meal 3 times a day, 140-199 - 2 units, 200-250 - 4 units, 251-299 - 6 units,  300-349 - 8 units,  350 or above 10 units. 9 mL 0   Insulin Pen Needle 32G X 4 MM MISC For 4 times a day insulin injection under the skin, 1 month supply. 100 each 0   losartan (COZAAR) 25 MG tablet Take 1 tablet (25 mg total) by mouth daily. 90 tablet 1   metoprolol tartrate (LOPRESSOR) 50 MG tablet TAKE 1 AND 1/2 TABLETS BY MOUTH TWO TIMES A DAY 90 tablet 0   mirtazapine (REMERON) 30 MG tablet TAKE 1 TABLET BY MOUTH EVERY NIGHT AT BEDTIME FOR APPETITE 90 tablet 1   pantoprazole (PROTONIX) 40 MG tablet Take 1 tablet (40 mg total) by mouth daily. 30 tablet 3   simvastatin (ZOCOR) 40 MG tablet TAKE ONE TABLET BY MOUTH DAILY (Patient taking differently: Take 40 mg by mouth at bedtime.) 90 tablet 3    spironolactone (ALDACTONE) 25 MG tablet Take 1 tablet (25 mg total) by mouth 2 (two) times daily. 180 tablet 3   Tocilizumab (ACTEMRA IV) Inject 6 mg/kg into the vein every 28 (twenty-eight) days. Receives at [employer]     potassium chloride SA (KLOR-CON M) 20 MEQ tablet Take 1 tablet (20 mEq total) by mouth once for 1 dose. 7 tablet 0   No current facility-administered medications on file prior to visit.     No Known Allergies Social History   Socioeconomic History   Marital status: Married    Spouse name: Not on file   Number of children: 1   Years of education: Not on file   Highest education level: Not on file  Occupational History   Not on file  Tobacco Use   Smoking status: Former    Current packs/day: 0.00    Types: Cigarettes    Quit date: 11/04/1985    Years since quitting: 37.0   Smokeless tobacco: Never  Vaping Use   Vaping status: Never Used  Substance and Sexual Activity   Alcohol use: Not Currently    Alcohol/week: 2.0 - 3.0 standard drinks of alcohol    Types: 2 - 3 Cans of beer per week   Drug use: No   Sexual activity: Not on file  Other Topics Concern   Not on file  Social History Narrative   Not on file   Social Determinants of Health   Financial Resource Strain: Low Risk  (08/24/2022)   Overall Financial Resource Strain (CARDIA)    Difficulty of Paying Living Expenses: Not hard at all  Food Insecurity: No Food Insecurity (08/24/2022)   Hunger Vital Sign    Worried About Running Out of Food in the Last Year: Never true    Ran Out of Food in the Last Year: Never true  Transportation Needs: No Transportation Needs (08/24/2022)   PRAPARE - Administrator, Civil Service (Medical): No    Lack of Transportation (Non-Medical): No  Physical Activity: Sufficiently Active (07/10/2021)   Exercise Vital Sign    Days of Exercise per Week: 5 days    Minutes of Exercise per Session: 30 min  Stress: No Stress Concern Present  (08/24/2022)   [employer] of Occupational Health - Occupational Stress Questionnaire    Feeling of Stress : Only a little  Social Connections: Socially Integrated (07/22/2022)   Social Connection and Isolation Panel [NHANES]    Frequency of Communication with Friends and Family: More than three times a week    Frequency of Social Gatherings with Friends and Family: More than three times a week    Attends Religious Services: More than 4 times per year    Active Member of [employer] or Organizations: Yes    Attends Banker Meetings: More than 4 times per year    Marital Status: Married  Catering manager Violence: Not At Risk (08/13/2022)   Humiliation, Afraid, Rape, and Kick questionnaire    Fear of Current or Ex-Partner: No    Emotionally Abused: No    Physically Abused: No    Sexually Abused: No     Review of Systems  All other systems reviewed and are negative.      Objective:   Physical Exam Vitals reviewed.  Constitutional:      Appearance: He is well-developed.  Neck:     Thyroid: No thyromegaly.     Vascular: No JVD.  Cardiovascular:     Rate and Rhythm: Normal rate. Rhythm irregular.     Heart sounds: Normal heart sounds. No murmur heard. Pulmonary:     Effort: Pulmonary effort is normal. No respiratory distress.     Breath sounds: Normal breath sounds. No wheezing or rales.  Chest:     Chest wall: No tenderness.  Abdominal:     General: Bowel sounds are normal. There is no distension.     Palpations: Abdomen is soft. There is no mass.     Tenderness: There is no abdominal tenderness. There is no guarding or rebound.  Musculoskeletal:     Cervical back: Neck supple.     Right lower leg: Edema present.     Left lower leg: Edema present.  Lymphadenopathy:     Cervical: No cervical adenopathy.  Skin:    Findings: Bruising and rash present.           Assessment & Plan:  Acute systolic congestive heart failure (HCC) Patient's last renal  function had improved 2 days ago despite increasing his spironolactone.  Therefore I will not change the dose of spironolactone or Lasix.  I will increase metoprolol to 100 mg twice daily to try to decrease some of the tachycardia to increase cardiac output.  I helped the patient put on compression hose today and advised him to wear this until I see him next week.  At that time we will decide about switching losartan to [facility] and we will likely repeat his renal function to monitor his potassium on the higher doses of diuretics

## 2022-11-26 ENCOUNTER — Other Ambulatory Visit: Payer: Self-pay | Admitting: Family Medicine

## 2022-11-26 DIAGNOSIS — I5021 Acute systolic (congestive) heart failure: Secondary | ICD-10-CM

## 2022-11-26 NOTE — Telephone Encounter (Signed)
Requested medication (s) are due for refill today: Yes  Requested medication (s) are on the active medication list: Yes  Last refill:  11/11/22  Future visit scheduled:  Notes to clinic:  Prescription has expired.    Requested Prescriptions  Pending Prescriptions Disp Refills   KLOR-CON M20 20 MEQ tablet [Pharmacy Med Name: KLOR-CON M20 TABLET] 7 tablet 0    Sig: TAKE 1 TABLET BY MOUTH DAILY FOR 7 DAYS     Endocrinology:  Minerals - Potassium Supplementation Failed - 11/26/2022  6:11 AM      Failed - Cr in normal range and within 360 days    Creat  Date Value Ref Range Status  11/23/2022 1.35 (H) 0.70 - 1.22 mg/dL Final   Creatinine, Urine  Date Value Ref Range Status  11/13/2021 94 20 - 320 mg/dL Final         Failed - Valid encounter within last 12 months    Recent Outpatient Visits           1 year ago Right carotid bruit   [facility] Pickard, Priscille Heidelberg, MD   1 year ago Dehydration   [facility] Pickard, Priscille Heidelberg, MD   1 year ago Dysuria   [facility] [name], MD   2 years ago Type 2 diabetes mellitus without complication, without long-term current use of insulin (HCC)   [facility] [name], MD   2 years ago Benign essential HTN   [facility] Pickard, Priscille Heidelberg, MD       Future Appointments             In 3 weeks Hilty, Lisette Abu, MD [facility]   In 1 month Rice, Jamesetta Orleans, MD [facility] - A Dept Of [place]. [facility]            Passed - K in normal range and within 360 days    Potassium  Date Value Ref Range Status  11/23/2022 3.6 3.5 - 5.3 mmol/L Final

## 2022-11-29 ENCOUNTER — Ambulatory Visit (HOSPITAL_COMMUNITY): Payer: Medicare Other | Attending: Physician Assistant

## 2022-11-30 ENCOUNTER — Encounter (HOSPITAL_BASED_OUTPATIENT_CLINIC_OR_DEPARTMENT_OTHER): Payer: Medicare Other | Admitting: Internal Medicine

## 2022-11-30 ENCOUNTER — Ambulatory Visit (INDEPENDENT_AMBULATORY_CARE_PROVIDER_SITE_OTHER): Payer: Medicare Other | Admitting: Family Medicine

## 2022-11-30 ENCOUNTER — Encounter: Payer: Self-pay | Admitting: Family Medicine

## 2022-11-30 VITALS — BP 124/68 | HR 109 | Temp 97.5°F | Ht 68.0 in | Wt 155.4 lb

## 2022-11-30 DIAGNOSIS — I5021 Acute systolic (congestive) heart failure: Secondary | ICD-10-CM | POA: Diagnosis not present

## 2022-11-30 DIAGNOSIS — L03116 Cellulitis of left lower limb: Secondary | ICD-10-CM

## 2022-11-30 MED ORDER — CEPHALEXIN 500 MG PO CAPS: 500.0000 mg | ORAL_CAPSULE | Freq: Three times a day (TID) | ORAL | 0 refills | Status: DC

## 2022-11-30 NOTE — Progress Notes (Signed)
Wt Readings from Last 3 Encounters:  11/30/22 155 lb 6.4 oz (70.5 kg)  11/25/22 165 lb 12.8 oz (75.2 kg)  11/23/22 165 lb 3.2 oz (74.9 kg)     Subjective:    Patient ID: Donald Rios, male    DOB: [birth year], 82 y.o.   MRN: [id number]  11/15/22  Despite having increased his Lasix from 40 mg once a day to 40 mg twice a day, the patient presents with significant edema in both lower extremities.  He has +2 edema in his feet and in his ankles +1 edema to his knees.  He denies any shortness of breath or chest pain.  He has purpura on both legs due to the vascular congestion exacerbated by Eliquis. Wt Readings from Last 3 Encounters:  11/30/22 155 lb 6.4 oz (70.5 kg)  11/25/22 165 lb 12.8 oz (75.2 kg)  11/23/22 165 lb 3.2 oz (74.9 kg)   At that time, my plan was: Patient is clearly third spacing peripheral edema.  Therefore increasing diuretic is ineffective as the fluid is no longer in the intravascular space.  Recommended putting the patient in Unna boots bilaterally which..  Continue Lasix 40 mg twice daily and recheck on Thursday.  At that point we will repeat BMP to monitor electrolytes and likely increase dose of spironolactone.  11/18/22 Patient continues to have profound +2 pitting edema in both feet.  His toes are swollen.  The dorsums of both feet are swollen.  There is a large vesicle on the top of his left foot just proximal to the third and fourth MTP joints.  Patient reports aching pain in both feet.  The swelling diminishes above the ankle.  At that time, my plan was: Continue Lasix at current doses.  Increase spironolactone to 25 mg twice daily.  Unna boots were replaced bilaterally.  Recheck in the morning and check renal function at that time.  Consider home SCD's  11/19/22 Swelling has improved and the patient's feet.  He still has pitting edema at a +1 level in both feet.  The swelling in his shins to his knees has not improved.  However the una boots were extremely painful for  him.  At that time, my plan was: Recommended knee-high compression hose 15 to 20 mmHg.  Continue spironolactone 25 mg twice daily with the Lasix.  Check a BMP today.  Recheck BMP Wednesday next week to monitor renal function.  Recheck via telephone on Monday to see how the swelling is doing.  11/23/22 Wt Readings from Last 3 Encounters:  11/30/22 155 lb 6.4 oz (70.5 kg)  11/25/22 165 lb 12.8 oz (75.2 kg)  11/23/22 165 lb 3.2 oz (74.9 kg)   Since stopping the Unna boot compression wraps, the patient has gained 8 pounds.  He has +2 pitting edema in both legs to his knees.  He is too weak to be able to put on his compression hose.  His wife is too weak to be able to help him put on the compression hose.  As result he has not been using any compression therapy.  Patient suggest fraction of 30%.  Currently on metoprolol losartan.  He is taking Lasix 40 mg once daily and spironolactone 25 mg twice daily.  At that time, my plan was: Patient appears fluid overloaded.  Increase Lasix to 40 mg twice daily.  Continue spironolactone 25 mg twice daily.  Continue metoprolol and losartan.  I believe the patient would be better on Entresto compared to losartan  given his ejection fraction.  He has an appointment next week to see his cardiologist.  I will defer this decision to his cardiologist.  Monitor his potassium and renal function today with a BMP.  Strongly encouraged the patient to perform compression therapy at home.  However he is unable to get on his compression hose.  Therefore I will see if we get patient fitted for half leg bilateral SCDs.  11/25/22 Patient is here today with compression hose.  Neither he or his wife are able to put them on his legs because they are too swollen.  Therefore they are asking me to help put on the compression hose which I am happy to do.  His weight has not changed nor has the swelling.  He denies any chest pain or shortness of breath.  At that time, my plan was: Patient's  last renal function had improved 2 days ago despite increasing his spironolactone.  Therefore I will not change the dose of spironolactone or Lasix.  I will increase metoprolol to 100 mg twice daily to try to decrease some of the tachycardia to increase cardiac output.  I helped the patient put on compression hose today and advised him to wear this until I see him next week.  At that time we will decide about switching losartan to [facility] and we will likely repeat his renal function to monitor his potassium on the higher doses of diuretics  11/30/22 Swelling has improved dramatically in both legs.  The pitting edema has decreased to +1 or even trace in both feet.  He has lost 10 pounds.  He is wearing the compression hose every day.  However he has developed a tender patch of erythema however his left tibial tuberosity.  This is concerning for developing cellulitis versus septic bursitis.  He also has 2 pockets of fluid on his anterior right ankle that appear to be coagulated blood.  See the photograph below.   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    History of MI (myocardial infarction)    History of nuclear stress test 08/07/2010   dipyridamole; EKG negative for ischemia, low risk scan    Hyperlipidemia    Hyperplastic colon polyp 05/2008   Hypertension    Ischemic cardiomyopathy    EF 45%, with inferior wall motion abnormality    Myocardial infarction (HCC)    Valvular regurgitation    mitral and tricuspid (mild)   Past Surgical History:   Procedure Laterality Date   CARDIOVERSION N/A 08/16/2022   Procedure: CARDIOVERSION;  Surgeon: Jake Bathe, MD;  Location: [facility] INVASIVE CV LAB;  Service: Cardiovascular;  Laterality: N/A;   CORONARY ARTERY BYPASS GRAFT  2000   LIMA to LAD, free IMA to OM2, sequential graft to PLA & PLD   ESOPHAGOGASTRODUODENOSCOPY N/A 05/07/2013   Procedure: ESOPHAGOGASTRODUODENOSCOPY (EGD);  Surgeon: Iva Boop, MD;  Location: [facility] ENDOSCOPY;  Service: Endoscopy;  Laterality: N/A;   TEE WITHOUT  CARDIOVERSION N/A 08/16/2022   Procedure: TRANSESOPHAGEAL ECHOCARDIOGRAM;  Surgeon: Jake Bathe, MD;  [facility] INVASIVE CV LAB;  Service: Cardiovascular;  Laterality: N/A;   TRANSTHORACIC ECHOCARDIOGRAM  09/07/2010   EF 45-50%; LV systolic function mildly reduced; LA mildly dilated; mild-mod MR & mild-mod TR; aortic root sclerosis/calcification;    Current Outpatient Medications on File Prior to Visit  Medication Sig Dispense Refill   allopurinol (ZYLOPRIM) 300 MG tablet TAKE 1 TABLET BY MOUTH DAILY 90 tablet 0   apixaban (ELIQUIS) 5 MG TABS tablet Take 1 tablet (5 mg total) by mouth 2 (two) times daily. 60 tablet 5   Blood Glucose Monitoring Suppl (BLOOD GLUCOSE MONITOR SYSTEM) w/Device KIT 1 each by Does not apply route in the morning, at noon, and at bedtime. 1 kit 0   doxazosin (CARDURA) 2 MG tablet TAKE 1 TABLET BY MOUTH DAILY (Patient taking differently: Take 2 mg by mouth at bedtime.) 90 tablet 3   empagliflozin (JARDIANCE) 10 MG TABS tablet Take 1 tablet (10 mg total) by mouth daily. 30 tablet 2   furosemide (LASIX) 40 MG tablet Take 1 tablet (40 mg total) by mouth 2 (two) times daily. 180 tablet 3   glipiZIDE (GLIPIZIDE XL) 5 MG 24 hr tablet Take 1 tablet (5 mg total) by mouth daily with breakfast. 30 tablet 3   Glucose Blood (BLOOD GLUCOSE TEST STRIPS) STRP Use in the morning, at noon, and at bedtime. May substitute to any manufacturer covered by patient's insurance. 100 strip 2    HYDROcodone-acetaminophen (NORCO) 5-325 MG tablet Take 1 tablet by mouth every 6 (six) hours as needed for moderate pain (pain score 4-6). 30 tablet 0   insulin aspart (FIASP FLEXTOUCH) 100 UNIT/ML FlexTouch Pen Before each meal 3 times a day, 140-199 - 2 units, 200-250 - 4 units, 251-299 - 6 units,  300-349 - 8 units,  350 or above 10 units. 9 mL 0   Insulin Pen Needle 32G X 4 MM MISC For 4 times a day insulin injection under the skin, 1 month supply. 100 each 0   losartan (COZAAR) 25 MG tablet Take 1 tablet (25 mg total) by mouth daily. 90 tablet 1   metoprolol tartrate (LOPRESSOR) 50 MG tablet Take 2 tablets (100 mg total) by mouth 2 (two) times daily. 360 tablet 3   mirtazapine (REMERON) 30 MG tablet TAKE 1 TABLET BY MOUTH EVERY NIGHT AT BEDTIME FOR APPETITE 90 tablet 1   mupirocin ointment (BACTROBAN) 2 % SMARTSIG:1 Application Topical 2-3 Times Daily     pantoprazole (PROTONIX) 40 MG tablet Take 1 tablet (40 mg total) by mouth daily. 30 tablet 3   simvastatin (ZOCOR) 40 MG tablet TAKE ONE TABLET BY MOUTH DAILY (Patient taking differently: Take 40 mg by mouth at bedtime.) 90 tablet 3   spironolactone (ALDACTONE) 25 MG tablet Take 1 tablet (25 mg total) by mouth 2 (two) times daily. 180 tablet 3   Tocilizumab (ACTEMRA IV) Inject 6 mg/kg into the vein every 28 (twenty-eight) days. Receives at [employer]     cephALEXin (KEFLEX) 500 MG capsule Take 1 capsule (500 mg total) by mouth 3 (three) times daily. (Patient not taking: Reported on 11/30/2022) 21 capsule 0   potassium chloride SA (KLOR-CON M) 20 MEQ tablet Take 1 tablet (20 mEq total) by mouth once for 1 dose. 7 tablet 0   No current facility-administered medications on file prior to visit.     No Known Allergies Social History  Socioeconomic History   Marital status: Married    Spouse name: Not on file   Number of children: 1   Years of education: Not on file   Highest education level: Not on file  Occupational  History   Not on file  Tobacco Use   Smoking status: Former    Current packs/day: 0.00    Types: Cigarettes    Quit date: 11/04/1985    Years since quitting: 37.0   Smokeless tobacco: Never  Vaping Use   Vaping status: Never Used  Substance and Sexual Activity   Alcohol use: Not Currently    Alcohol/week: 2.0 - 3.0 standard drinks of alcohol    Types: 2 - 3 Cans of beer per week   Drug use: No   Sexual activity: Not on file  Other Topics Concern   Not on file  Social History Narrative   Not on file   Social Determinants of Health   Financial Resource Strain: Low Risk  (08/24/2022)   Overall Financial Resource Strain (CARDIA)    Difficulty of Paying Living Expenses: Not hard at all  Food Insecurity: No Food Insecurity (08/24/2022)   Hunger Vital Sign    Worried About Running Out of Food in the Last Year: Never true    Ran Out of Food in the Last Year: Never true  Transportation Needs: No Transportation Needs (08/24/2022)   PRAPARE - Administrator, Civil Service (Medical): No    Lack of Transportation (Non-Medical): No  Physical Activity: Sufficiently Active (07/10/2021)   Exercise Vital Sign    Days of Exercise per Week: 5 days    Minutes of Exercise per Session: 30 min  Stress: No Stress Concern Present (08/24/2022)   [employer] of Occupational Health - Occupational Stress Questionnaire    Feeling of Stress : Only a little  Social Connections: Socially Integrated (07/22/2022)   Social Connection and Isolation Panel [NHANES]    Frequency of Communication with Friends and Family: More than three times a week    Frequency of Social Gatherings with Friends and Family: More than three times a week    Attends Religious Services: More than 4 times per year    Active Member of [employer] or Organizations: Yes    Attends Banker Meetings: More than 4 times per year    Marital Status: Married  Catering manager Violence: Not At Risk (08/13/2022)    Humiliation, Afraid, Rape, and Kick questionnaire    Fear of Current or Ex-Partner: No    Emotionally Abused: No    Physically Abused: No    Sexually Abused: No     Review of Systems  All other systems reviewed and are negative.      Objective:   Physical Exam Vitals reviewed.  Constitutional:      Appearance: He is well-developed.  Neck:     Thyroid: No thyromegaly.     Vascular: No JVD.  Cardiovascular:     Rate and Rhythm: Normal rate. Rhythm irregular.     Heart sounds: Normal heart sounds. No murmur heard. Pulmonary:     Effort: Pulmonary effort is normal. No respiratory distress.     Breath sounds: Normal breath sounds. No wheezing or rales.  Chest:     Chest wall: No tenderness.  Abdominal:     General: Bowel sounds are normal. There is no distension.     Palpations: Abdomen is soft. There is no mass.     Tenderness: There is no  abdominal tenderness. There is no guarding or rebound.  Musculoskeletal:     Cervical back: Neck supple.     Right lower leg: Edema present.     Left lower leg: Edema present.  Lymphadenopathy:     Cervical: No cervical adenopathy.  Skin:    Findings: Erythema present.           Assessment & Plan:  Acute systolic congestive heart failure (HCC) - Plan: BASIC METABOLIC PANEL WITH GFR  Cellulitis of left lower extremity First, with regards to the congestive heart failure, he has diuresed 10 pounds.  Swelling is much improved in his legs.  Continue the compression hose as this seems to be helping immensely.  For the time being we will continue spironolactone and 25 mg twice a day and Lasix 40 mg twice a day.  I will monitor his kidney function.  He continues to lose weight or if his kidney function worsens we will decrease Lasix back to 40 mg a day.  Second, then 2 lumps on his right lower extremity, I believe may be subcutaneous hematomas caused by the crease of the compression hose.  I would allow these to resolve spontaneously.   Third, the patch of erythematous skin over the anterior tibial tubercle either represent cellulitis versus early septic bursitis.  Will try the patient on Keflex 500 mg 3 times a day for 1 week and then reassess.  If worsening he will need to see orthopedics.

## 2022-12-01 LAB — BASIC METABOLIC PANEL WITH GFR
BUN/Creatinine Ratio: 19 (calc) (ref 6–22)
BUN: 26 mg/dL — ABNORMAL HIGH (ref 7–25)
CO2: 32 mmol/L (ref 20–32)
Calcium: 9.1 mg/dL (ref 8.6–10.3)
Chloride: 102 mmol/L (ref 98–110)
Creat: 1.39 mg/dL — ABNORMAL HIGH (ref 0.70–1.22)
Glucose, Bld: 151 mg/dL — ABNORMAL HIGH (ref 65–99)
Potassium: 4.5 mmol/L (ref 3.5–5.3)
Sodium: 142 mmol/L (ref 135–146)
eGFR: 51 mL/min/{1.73_m2} — ABNORMAL LOW (ref >=60)

## 2022-12-02 ENCOUNTER — Ambulatory Visit: Payer: Medicare Other

## 2022-12-02 ENCOUNTER — Other Ambulatory Visit: Payer: Medicare Other

## 2022-12-02 VITALS — BP 130/76 | HR 98 | Temp 97.6°F | Resp 20 | Ht 68.0 in | Wt 156.0 lb

## 2022-12-02 DIAGNOSIS — M316 Other giant cell arteritis: Secondary | ICD-10-CM

## 2022-12-02 DIAGNOSIS — Z79899 Other long term (current) drug therapy: Secondary | ICD-10-CM

## 2022-12-02 LAB — CBC WITH DIFFERENTIAL/PLATELET
Basophils Absolute: 0.1 10*3/uL (ref 0.0–0.1)
Basophils Relative: 0.7 % (ref 0.0–3.0)
Eosinophils Absolute: 0.1 10*3/uL (ref 0.0–0.7)
Eosinophils Relative: 1 % (ref 0.0–5.0)
HCT: 35.5 % — ABNORMAL LOW (ref 39.0–52.0)
Hemoglobin: 11.5 g/dL — ABNORMAL LOW (ref 13.0–17.0)
Lymphocytes Relative: 26.1 % (ref 12.0–46.0)
Lymphs Abs: 2.9 10*3/uL (ref 0.7–4.0)
MCHC: 32.4 g/dL (ref 30.0–36.0)
MCV: 103.4 fL — ABNORMAL HIGH (ref 78.0–100.0)
Monocytes Absolute: 0.6 10*3/uL (ref 0.1–1.0)
Monocytes Relative: 5.3 % (ref 3.0–12.0)
Neutro Abs: 7.4 10*3/uL (ref 1.4–7.7)
Neutrophils Relative %: 66.9 % (ref 43.0–77.0)
Platelets: 185 10*3/uL (ref 150.0–400.0)
RBC: 3.43 Mil/uL — ABNORMAL LOW (ref 4.22–5.81)
RDW: 17.5 % — ABNORMAL HIGH (ref 11.5–15.5)
WBC: 11.1 10*3/uL — ABNORMAL HIGH (ref 4.0–10.5)

## 2022-12-02 LAB — COMPREHENSIVE METABOLIC PANEL
ALT: 15 U/L (ref 0–53)
AST: 15 U/L (ref 0–37)
Albumin: 3.8 g/dL (ref 3.5–5.2)
Alkaline Phosphatase: 54 U/L (ref 39–117)
BUN: 21 mg/dL (ref 6–23)
CO2: 31 meq/L (ref 19–32)
Calcium: 9.1 mg/dL (ref 8.4–10.5)
Chloride: 103 meq/L (ref 96–112)
Creatinine, Ser: 1.14 mg/dL (ref 0.40–1.50)
GFR: 60.55 mL/min (ref >60.00)
Glucose, Bld: 71 mg/dL (ref 70–99)
Potassium: 3.5 meq/L (ref 3.5–5.1)
Sodium: 142 meq/L (ref 135–145)
Total Bilirubin: 1.2 mg/dL (ref 0.2–1.2)
Total Protein: 5.6 g/dL — ABNORMAL LOW (ref 6.0–8.3)

## 2022-12-02 LAB — LIPID PANEL
Cholesterol: 151 mg/dL (ref 0–200)
HDL: 54.6 mg/dL (ref >39.00)
LDL Cholesterol: 76 mg/dL (ref 0–99)
NonHDL: 96.66
Total CHOL/HDL Ratio: 3
Triglycerides: 101 mg/dL (ref 0.0–149.0)
VLDL: 20.2 mg/dL (ref 0.0–40.0)

## 2022-12-02 MED ORDER — TOCILIZUMAB 400 MG/20ML IV SOLN
6.0000 mg/kg | Freq: Once | INTRAVENOUS | Status: AC
  Administered 2022-12-02: 424 mg via INTRAVENOUS
  Filled 2022-12-02: qty 21.2

## 2022-12-02 MED ORDER — ACETAMINOPHEN 325 MG PO TABS
650.0000 mg | ORAL_TABLET | Freq: Once | ORAL | Status: AC
  Administered 2022-12-02: 650 mg via ORAL
  Filled 2022-12-02: qty 2

## 2022-12-02 MED ORDER — DIPHENHYDRAMINE HCL 25 MG PO CAPS
25.0000 mg | ORAL_CAPSULE | Freq: Once | ORAL | Status: AC
  Administered 2022-12-02: 25 mg via ORAL
  Filled 2022-12-02: qty 1

## 2022-12-02 NOTE — Progress Notes (Signed)
Diagnosis: Temporal arteritis   Provider:  Chilton Greathouse MD  Procedure: IV Infusion  IV Type: Peripheral, IV Location: L Forearm  Actemra (Tocilizumab), Dose: 424mg   Infusion Start Time: 1511  Infusion Stop Time: 1611  Post Infusion IV Care: Observation period completed and Peripheral IV Discontinued  Discharge: Condition: Good, Destination: Home . AVS Provided  Performed by:  Loney Hering, LPN

## 2022-12-07 ENCOUNTER — Ambulatory Visit (HOSPITAL_COMMUNITY)
Admission: RE | Admit: 2022-12-07 | Discharge: 2022-12-07 | Disposition: A | Discharge status: Home / Self Care | Payer: Medicare Other | Source: Ambulatory Visit | Attending: Internal Medicine | Admitting: Internal Medicine

## 2022-12-07 ENCOUNTER — Telehealth: Payer: Self-pay | Admitting: Internal Medicine

## 2022-12-07 DIAGNOSIS — I5022 Chronic systolic (congestive) heart failure: Secondary | ICD-10-CM | POA: Diagnosis not present

## 2022-12-07 NOTE — Telephone Encounter (Signed)
   Pre-operative Risk Assessment    Patient Name: Donald Rios  DOB: July 30, 1940 MRN: [id number]      Request for Surgical Clearance    Procedure:  Ureteroscopy with laser  Date of Surgery:  Clearance 12/21/22                                 Surgeon:  Dr. Hadley Pen Surgeon's Group or Practice Name: [facility]  Phone number:  [phone] Fax number:  [phone]   Type of Clearance Requested:   - Pharmacy:  Hold Warfarin (Coumadin) up to cardiology   Type of Anesthesia:  General    Additional requests/questions:    Mauri Brooklyn   12/07/2022, 2:43 PM

## 2022-12-08 LAB — ECHOCARDIOGRAM LIMITED
Calc EF: 31.6 %
Est EF: 25
S' Lateral: 4.51 cm
Single Plane A2C EF: 36.6 %
Single Plane A4C EF: 25.8 %

## 2022-12-08 NOTE — Telephone Encounter (Signed)
Patient with diagnosis of afib on Eliquis for anticoagulation.    Procedure: Ureteroscopy with laser  Date of procedure: 12/21/22   CHA2DS2-VASc Score = 6   This indicates a 9.7% annual risk of stroke. The patient's score is based upon: CHF History: 1 HTN History: 1 Diabetes History: 1 Stroke History: 0 Vascular Disease History: 1 Age Score: 2 Gender Score: 0      CrCl 51.7 ml/min Platelet count 185  Per office protocol, patient can hold Eliquis for 2 days prior to procedure.    **This guidance is not considered finalized until pre-operative APP has relayed final recommendations.**

## 2022-12-08 NOTE — Telephone Encounter (Signed)
   Name: Donald Rios  DOB: Nov 22, 1940  MRN: [id number]  Primary Cardiologist: Chrystie Nose, MD   Preoperative team, please contact this patient and set up a phone call appointment for further preoperative risk assessment. Please obtain consent and complete medication review. Thank you for your help.  I confirm that guidance regarding antiplatelet and oral anticoagulation therapy has been completed and, if necessary, noted below.  Warfarin not listed on patient's medication list. Per office protocol, patient can hold Eliquis for 2 days prior to procedure.    I also confirmed the patient resides in the state of [place]. As per [facility] Medical Board telemedicine laws, the patient must reside in the state in which the provider is licensed.   Ronney Asters, NP 12/08/2022, 1:00 PM [facility]

## 2022-12-14 ENCOUNTER — Ambulatory Visit (INDEPENDENT_AMBULATORY_CARE_PROVIDER_SITE_OTHER): Payer: Medicare Other | Admitting: Family Medicine

## 2022-12-14 ENCOUNTER — Encounter: Payer: Self-pay | Admitting: Family Medicine

## 2022-12-14 VITALS — BP 126/72 | HR 76 | Temp 97.6°F | Ht 68.0 in | Wt 149.6 lb

## 2022-12-14 DIAGNOSIS — I5021 Acute systolic (congestive) heart failure: Secondary | ICD-10-CM

## 2022-12-14 DIAGNOSIS — L97522 Non-pressure chronic ulcer of other part of left foot with fat layer exposed: Secondary | ICD-10-CM

## 2022-12-14 DIAGNOSIS — M71162 Other infective bursitis, left knee: Secondary | ICD-10-CM | POA: Diagnosis not present

## 2022-12-14 DIAGNOSIS — B9689 Other specified bacterial agents as the cause of diseases classified elsewhere: Secondary | ICD-10-CM

## 2022-12-14 MED ORDER — SULFAMETHOXAZOLE-TRIMETHOPRIM 800-160 MG PO TABS: 1.0000 | ORAL_TABLET | Freq: Two times a day (BID) | ORAL | 0 refills | Status: DC

## 2022-12-14 NOTE — Telephone Encounter (Signed)
I s/w the pt today about clearance request that we received from Dr. Jorja Loa office [facility] for procedure 12/21/22. Pt tells me he does not have any procedure/surgery planned with Dr. Jorja Loa office. Pt did confirm his appt with Dr. Rennis Golden 12/20/22 @ 11:30.   I asked the pt again about the clearance and he said no procedures that he has been made aware of.  I will update the requesting office to see notes per the pt. I did tell the pt to let our office know if something did change about a procedure with Dr. Sherryl Barters. Pt said ok and thank you.   I will still forward to Dr. Rennis Golden as Lorain Childes incase the pt does d/w him at appt 12/20/22.

## 2022-12-14 NOTE — Progress Notes (Signed)
Wt Readings from Last 3 Encounters:  12/14/22 149 lb 9.6 oz (67.9 kg)  12/02/22 156 lb (70.8 kg)  11/30/22 155 lb 6.4 oz (70.5 kg)     Subjective:    Patient ID: Donald Rios, male    DOB: Aug 11, 1940, 82 y.o.   MRN: [id number]  11/15/22  Despite having increased his Lasix from 40 mg once a day to 40 mg twice a day, the patient presents with significant edema in both lower extremities.  He has +2 edema in his feet and in his ankles +1 edema to his knees.  He denies any shortness of breath or chest pain.  He has purpura on both legs due to the vascular congestion exacerbated by Eliquis. Wt Readings from Last 3 Encounters:  12/14/22 149 lb 9.6 oz (67.9 kg)  12/02/22 156 lb (70.8 kg)  11/30/22 155 lb 6.4 oz (70.5 kg)   At that time, my plan was: Patient is clearly third spacing peripheral edema.  Therefore increasing diuretic is ineffective as the fluid is no longer in the intravascular space.  Recommended putting the patient in Unna boots bilaterally which..  Continue Lasix 40 mg twice daily and recheck on Thursday.  At that point we will repeat BMP to monitor electrolytes and likely increase dose of spironolactone.  11/18/22 Patient continues to have profound +2 pitting edema in both feet.  His toes are swollen.  The dorsums of both feet are swollen.  There is a large vesicle on the top of his left foot just proximal to the third and fourth MTP joints.  Patient reports aching pain in both feet.  The swelling diminishes above the ankle.  At that time, my plan was: Continue Lasix at current doses.  Increase spironolactone to 25 mg twice daily.  Unna boots were replaced bilaterally.  Recheck in the morning and check renal function at that time.  Consider home SCD's  11/19/22 Swelling has improved and the patient's feet.  He still has pitting edema at a +1 level in both feet.  The swelling in his shins to his knees has not improved.  However the una boots were extremely painful for him.  At that  time, my plan was: Recommended knee-high compression hose 15 to 20 mmHg.  Continue spironolactone 25 mg twice daily with the Lasix.  Check a BMP today.  Recheck BMP Wednesday next week to monitor renal function.  Recheck via telephone on Monday to see how the swelling is doing.  11/23/22 Wt Readings from Last 3 Encounters:  12/14/22 149 lb 9.6 oz (67.9 kg)  12/02/22 156 lb (70.8 kg)  11/30/22 155 lb 6.4 oz (70.5 kg)   Since stopping the Unna boot compression wraps, the patient has gained 8 pounds.  He has +2 pitting edema in both legs to his knees.  He is too weak to be able to put on his compression hose.  His wife is too weak to be able to help him put on the compression hose.  As result he has not been using any compression therapy.  Patient suggest fraction of 30%.  Currently on metoprolol losartan.  He is taking Lasix 40 mg once daily and spironolactone 25 mg twice daily.  At that time, my plan was: Patient appears fluid overloaded.  Increase Lasix to 40 mg twice daily.  Continue spironolactone 25 mg twice daily.  Continue metoprolol and losartan.  I believe the patient would be better on Entresto compared to losartan given his ejection fraction.  He  has an appointment next week to see his cardiologist.  I will defer this decision to his cardiologist.  Monitor his potassium and renal function today with a BMP.  Strongly encouraged the patient to perform compression therapy at home.  However he is unable to get on his compression hose.  Therefore I will see if we get patient fitted for half leg bilateral SCDs.  11/25/22 Patient is here today with compression hose.  Neither he or his wife are able to put them on his legs because they are too swollen.  Therefore they are asking me to help put on the compression hose which I am happy to do.  His weight has not changed nor has the swelling.  He denies any chest pain or shortness of breath.  At that time, my plan was: Patient's last renal function had  improved 2 days ago despite increasing his spironolactone.  Therefore I will not change the dose of spironolactone or Lasix.  I will increase metoprolol to 100 mg twice daily to try to decrease some of the tachycardia to increase cardiac output.  I helped the patient put on compression hose today and advised him to wear this until I see him next week.  At that time we will decide about switching losartan to [facility] and we will likely repeat his renal function to monitor his potassium on the higher doses of diuretics  11/30/22 Swelling has improved dramatically in both legs.  The pitting edema has decreased to +1 or even trace in both feet.  He has lost 10 pounds.  He is wearing the compression hose every day.  However he has developed a tender patch of erythema however his left tibial tuberosity.  This is concerning for developing cellulitis versus septic bursitis.  He also has 2 pockets of fluid on his anterior right ankle that appear to be coagulated blood.  See the photograph below.  At that time, my plan was: First, with regards to the congestive heart failure, he has diuresed 10 pounds.  Swelling is much improved in his legs.  Continue the compression hose as this seems to be helping immensely.  For the time being we will continue spironolactone and 25 mg twice a day and Lasix 40 mg twice a day.  I will monitor his kidney function.  He continues to lose weight or if his kidney function worsens we will decrease Lasix back to 40 mg a day.  Second, then 2 lumps on his right lower extremity, I believe may be subcutaneous hematomas caused by the crease of the compression hose.  I would allow these to resolve spontaneously.  Third, the patch of erythematous skin over the anterior tibial tubercle either represent cellulitis versus early septic bursitis.  Will try the patient on Keflex 500 mg 3 times a day for 1 week and then reassess.  If worsening he will need to see orthopedics.  12/14/22  Patient has  developed a 2 cm x 3-1/2 cm ulcer on the dorsum of his left foot as shown above.  The skin over the anterior tibial tubercle of his left knee is less erythematous today.  It is no longer warm to the touch although it is still slightly tender.  I am concerned that the patient may have septic bursitis that area.  Previously it was very swollen.  It seems to have responded to Keflex but it has not entirely resolved and sent him.  There is no redness surrounding the ulcer on the dorsum of  his foot.  The wound bed is tender to the touch. 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    History of MI (myocardial infarction)    History of nuclear stress test 08/07/2010   dipyridamole; EKG negative for ischemia, low risk scan    Hyperlipidemia    Hyperplastic colon polyp 05/2008   Hypertension    Ischemic cardiomyopathy    EF 45%, with inferior wall motion abnormality    Myocardial infarction (HCC)    Valvular regurgitation    mitral and tricuspid (mild)   Past Surgical History:  Procedure Laterality Date   CARDIOVERSION N/A 08/16/2022   Procedure: CARDIOVERSION;  Surgeon: Jake Bathe, MD;  Location: [facility] INVASIVE CV [facility];  Service: Cardiovascular;  Laterality: N/A;   CORONARY ARTERY BYPASS GRAFT  2000   LIMA to LAD, free IMA to OM2, sequential graft to PLA & PLD   ESOPHAGOGASTRODUODENOSCOPY N/A 05/07/2013   Procedure: ESOPHAGOGASTRODUODENOSCOPY (EGD);  Surgeon: Iva Boop, MD;  Location: [facility] ENDOSCOPY;  Service: Endoscopy;  Laterality: N/A;   TEE WITHOUT  CARDIOVERSION N/A 08/16/2022   Procedure: TRANSESOPHAGEAL ECHOCARDIOGRAM;  Surgeon: Jake Bathe, MD;  Location: [facility] INVASIVE CV LAB;  Service: Cardiovascular;  Laterality: N/A;   TRANSTHORACIC ECHOCARDIOGRAM  09/07/2010   EF 45-50%; LV systolic function mildly reduced; LA mildly dilated; mild-mod MR & mild-mod TR; aortic root sclerosis/calcification;    Current Outpatient Medications on File Prior to Visit  Medication Sig Dispense Refill   allopurinol (ZYLOPRIM) 300 MG tablet TAKE 1 TABLET BY MOUTH DAILY 90 tablet 0   apixaban (ELIQUIS) 5 MG TABS tablet Take 1 tablet (5 mg total) by mouth 2 (two) times daily. 60 tablet 5   Blood Glucose Monitoring Suppl (BLOOD GLUCOSE MONITOR SYSTEM) w/Device KIT 1 each by Does not apply route in the morning, at noon, and at bedtime. 1 kit 0   cephALEXin (KEFLEX) 500 MG capsule Take 1 capsule (500 mg total) by mouth 3 (three) times daily. (Patient not taking: Reported on 11/30/2022) 21 capsule 0   cephALEXin (KEFLEX) 500 MG capsule Take 1 capsule (500 mg total) by mouth 3 (three) times daily. 21 capsule 0   doxazosin (CARDURA) 2 MG tablet TAKE 1 TABLET BY MOUTH DAILY (Patient taking differently: Take 2 mg by mouth at bedtime.) 90 tablet 3   empagliflozin (JARDIANCE) 10 MG TABS tablet Take 1 tablet (10 mg total) by mouth daily. 30 tablet 2   furosemide (LASIX) 40 MG tablet Take 1 tablet (40 mg total) by mouth 2 (two) times daily. 180 tablet 3   glipiZIDE (GLIPIZIDE XL) 5 MG 24 hr tablet Take 1 tablet (5 mg total) by mouth daily with breakfast. 30 tablet 3   Glucose Blood (BLOOD GLUCOSE TEST STRIPS) STRP Use in the morning, at noon, and at bedtime. May substitute to any manufacturer covered by patient's insurance. 100 strip 2   HYDROcodone-acetaminophen (NORCO) 5-325 MG tablet Take 1 tablet by mouth every 6 (six)  hours as needed for moderate pain (pain score 4-6). 30 tablet 0   insulin aspart (FIASP FLEXTOUCH) 100 UNIT/ML FlexTouch Pen Before each meal 3 times a day,  140-199 - 2 units, 200-250 - 4 units, 251-299 - 6 units,  300-349 - 8 units,  350 or above 10 units. 9 mL 0   Insulin Pen Needle 32G X 4 MM MISC For 4 times a day insulin injection under the skin, 1 month supply. 100 each 0   losartan (COZAAR) 25 MG tablet Take 1 tablet (25 mg total) by mouth daily. 90 tablet 1   metoprolol tartrate (LOPRESSOR) 50 MG tablet Take 2 tablets (100 mg total) by mouth 2 (two) times daily. 360 tablet 3   mirtazapine (REMERON) 30 MG tablet TAKE 1 TABLET BY MOUTH EVERY NIGHT AT BEDTIME FOR APPETITE 90 tablet 1   mupirocin ointment (BACTROBAN) 2 % SMARTSIG:1 Application Topical 2-3 Times Daily     pantoprazole (PROTONIX) 40 MG tablet Take 1 tablet (40 mg total) by mouth daily. 30 tablet 3   potassium chloride SA (KLOR-CON M) 20 MEQ tablet Take 1 tablet (20 mEq total) by mouth once for 1 dose. 7 tablet 0   simvastatin (ZOCOR) 40 MG tablet TAKE ONE TABLET BY MOUTH DAILY (Patient taking differently: Take 40 mg by mouth at bedtime.) 90 tablet 3   spironolactone (ALDACTONE) 25 MG tablet Take 1 tablet (25 mg total) by mouth 2 (two) times daily. 180 tablet 3   Tocilizumab (ACTEMRA IV) Inject 6 mg/kg into the vein every 28 (twenty-eight) days. Receives at [employer]     No current facility-administered medications on file prior to visit.     No Known Allergies Social History   Socioeconomic History   Marital status: Married    Spouse name: Not on file   Number of children: 1   Years of education: Not on file   Highest education level: Not on file  Occupational History   Not on file  Tobacco Use   Smoking status: Former    Current packs/day: 0.00    Types: Cigarettes    Quit date: 11/04/1985    Years since quitting: 37.1   Smokeless tobacco: Never  Vaping Use   Vaping status: Never Used  Substance and Sexual Activity   Alcohol use: Not Currently    Alcohol/week: 2.0 - 3.0 standard drinks of alcohol    Types: 2 - 3 Cans of beer per week   Drug  use: No   Sexual activity: Not on file  Other Topics Concern   Not on file  Social History Narrative   Not on file   Social Determinants of Health   Financial Resource Strain: Low Risk  (08/24/2022)   Overall Financial Resource Strain (CARDIA)    Difficulty of Paying Living Expenses: Not hard at all  Food Insecurity: No Food Insecurity (08/24/2022)   Hunger Vital Sign    Worried About Running Out of Food in the Last Year: Never true    Ran Out of Food in the Last Year: Never true  Transportation Needs: No Transportation Needs (08/24/2022)   PRAPARE - Administrator, Civil Service (Medical): No    Lack of Transportation (Non-Medical): No  Physical Activity: Sufficiently Active (07/10/2021)   Exercise Vital Sign    Days of Exercise per Week: 5 days    Minutes of Exercise per Session: 30 min  Stress: No Stress Concern Present (08/24/2022)   [employer] of Occupational  Health - Occupational Stress Questionnaire    Feeling of Stress : Only a little  Social Connections: Socially Integrated (07/22/2022)   Social Connection and Isolation Panel [NHANES]    Frequency of Communication with Friends and Family: More than three times a week    Frequency of Social Gatherings with Friends and Family: More than three times a week    Attends Religious Services: More than 4 times per year    Active Member of [employer] or Organizations: Yes    Attends Banker Meetings: More than 4 times per year    Marital Status: Married  Catering manager Violence: Not At Risk (08/13/2022)   Humiliation, Afraid, Rape, and Kick questionnaire    Fear of Current or Ex-Partner: No    Emotionally Abused: No    Physically Abused: No    Sexually Abused: No     Review of Systems  All other systems reviewed and are negative.      Objective:   Physical Exam Vitals reviewed.  Constitutional:      Appearance: He is well-developed.  Neck:     Thyroid: No thyromegaly.     Vascular: No JVD.   Cardiovascular:     Rate and Rhythm: Normal rate. Rhythm irregular.     Pulses:          Dorsalis pedis pulses are 1+ on the left side.       Posterior tibial pulses are 1+ on the left side.     Heart sounds: Normal heart sounds. No murmur heard. Pulmonary:     Effort: Pulmonary effort is normal. No respiratory distress.     Breath sounds: Normal breath sounds. No wheezing or rales.  Chest:     Chest wall: No tenderness.  Abdominal:     General: Bowel sounds are normal. There is no distension.     Palpations: Abdomen is soft. There is no mass.     Tenderness: There is no abdominal tenderness. There is no guarding or rebound.  Musculoskeletal:     Cervical back: Neck supple.     Right lower leg: Edema present.     Left lower leg: Edema present.       Feet:  Feet:     Left foot:     Skin integrity: Ulcer and skin breakdown present. No erythema or warmth.  Lymphadenopathy:     Cervical: No cervical adenopathy.  Skin:    Findings: Erythema present.           Assessment & Plan:  Ulcerated, foot, left, with fat layer exposed (HCC)  Septic infrapatellar bursitis of left knee  Acute systolic congestive heart failure [facility]) Patient recently had an echocardiogram that showed an ejection fraction of 25%.  His heart rate is better controlled today.  He denies any shortness of breath.  He has an appoint to see cardiology on Monday.  I will defer to them whether they uptitrate his beta-blocker or whether they add Entresto to better manage his heart failure.  We will continue his Lasix and spironolactone as currently prescribed as this seems to be keeping the patient euvolemic.  I see no evidence of osteomyelitis of the left foot.  I performed a wet-to-dry dressing change today.  I used a curette to scrape the wound bottom to remove any necrotic debris.  I then applied sterile gauze soaked with saline to the wound bed covered this with nonadherent gauze and then wrapped the foot with  Coban.  I recommended performing this  dressing change on a daily basis.  I will see the patient back in 48 hours.  If we see good granulation tissue through debridement and dressing changes and the wound edges start to close, we will continue this.  If the wound on the foot does not improve, I would recommend an MRI of the foot to evaluate for underlying osteomyelitis and also obtain arterial Dopplers to evaluate for peripheral vascular disease.  I am concerned the patient may have septic bursitis in the superficial infrapatellar bursa.  Switch the patient to Bactrim double strength tablets twice daily and consult orthopedics in case it is not improving.

## 2022-12-16 ENCOUNTER — Ambulatory Visit: Payer: Medicare Other | Admitting: Family Medicine

## 2022-12-16 ENCOUNTER — Ambulatory Visit: Payer: Medicare Other

## 2022-12-16 VITALS — BP 114/62 | HR 113 | Temp 97.5°F | Ht 68.0 in | Wt 149.8 lb

## 2022-12-16 DIAGNOSIS — M869 Osteomyelitis, unspecified: Secondary | ICD-10-CM | POA: Diagnosis not present

## 2022-12-16 DIAGNOSIS — L97522 Non-pressure chronic ulcer of other part of left foot with fat layer exposed: Secondary | ICD-10-CM | POA: Diagnosis not present

## 2022-12-16 NOTE — Progress Notes (Signed)
Wt Readings from Last 3 Encounters:  12/16/22 149 lb 12.8 oz (67.9 kg)  12/14/22 149 lb 9.6 oz (67.9 kg)  12/02/22 156 lb (70.8 kg)     Subjective:    Patient ID: Donald Rios, male    DOB: August 12, 1940, 82 y.o.   MRN: [id number]  11/15/22  Despite having increased his Lasix from 40 mg once a day to 40 mg twice a day, the patient presents with significant edema in both lower extremities.  He has +2 edema in his feet and in his ankles +1 edema to his knees.  He denies any shortness of breath or chest pain.  He has purpura on both legs due to the vascular congestion exacerbated by Eliquis. Wt Readings from Last 3 Encounters:  12/16/22 149 lb 12.8 oz (67.9 kg)  12/14/22 149 lb 9.6 oz (67.9 kg)  12/02/22 156 lb (70.8 kg)   At that time, my plan was: Patient is clearly third spacing peripheral edema.  Therefore increasing diuretic is ineffective as the fluid is no longer in the intravascular space.  Recommended putting the patient in Unna boots bilaterally which..  Continue Lasix 40 mg twice daily and recheck on Thursday.  At that point we will repeat BMP to monitor electrolytes and likely increase dose of spironolactone.  11/18/22 Patient continues to have profound +2 pitting edema in both feet.  His toes are swollen.  The dorsums of both feet are swollen.  There is a large vesicle on the top of his left foot just proximal to the third and fourth MTP joints.  Patient reports aching pain in both feet.  The swelling diminishes above the ankle.  At that time, my plan was: Continue Lasix at current doses.  Increase spironolactone to 25 mg twice daily.  Unna boots were replaced bilaterally.  Recheck in the morning and check renal function at that time.  Consider home SCD's  11/19/22 Swelling has improved and the patient's feet.  He still has pitting edema at a +1 level in both feet.  The swelling in his shins to his knees has not improved.  However the una boots were extremely painful for him.  At that  time, my plan was: Recommended knee-high compression hose 15 to 20 mmHg.  Continue spironolactone 25 mg twice daily with the Lasix.  Check a BMP today.  Recheck BMP Wednesday next week to monitor renal function.  Recheck via telephone on Monday to see how the swelling is doing.  11/23/22 Wt Readings from Last 3 Encounters:  12/16/22 149 lb 12.8 oz (67.9 kg)  12/14/22 149 lb 9.6 oz (67.9 kg)  12/02/22 156 lb (70.8 kg)   Since stopping the Unna boot compression wraps, the patient has gained 8 pounds.  He has +2 pitting edema in both legs to his knees.  He is too weak to be able to put on his compression hose.  His wife is too weak to be able to help him put on the compression hose.  As result he has not been using any compression therapy.  Patient suggest fraction of 30%.  Currently on metoprolol losartan.  He is taking Lasix 40 mg once daily and spironolactone 25 mg twice daily.  At that time, my plan was: Patient appears fluid overloaded.  Increase Lasix to 40 mg twice daily.  Continue spironolactone 25 mg twice daily.  Continue metoprolol and losartan.  I believe the patient would be better on Entresto compared to losartan given his ejection fraction.  He  has an appointment next week to see his cardiologist.  I will defer this decision to his cardiologist.  Monitor his potassium and renal function today with a BMP.  Strongly encouraged the patient to perform compression therapy at home.  However he is unable to get on his compression hose.  Therefore I will see if we get patient fitted for half leg bilateral SCDs.  11/25/22 Patient is here today with compression hose.  Neither he or his wife are able to put them on his legs because they are too swollen.  Therefore they are asking me to help put on the compression hose which I am happy to do.  His weight has not changed nor has the swelling.  He denies any chest pain or shortness of breath.  At that time, my plan was: Patient's last renal function had  improved 2 days ago despite increasing his spironolactone.  Therefore I will not change the dose of spironolactone or Lasix.  I will increase metoprolol to 100 mg twice daily to try to decrease some of the tachycardia to increase cardiac output.  I helped the patient put on compression hose today and advised him to wear this until I see him next week.  At that time we will decide about switching losartan to [facility] and we will likely repeat his renal function to monitor his potassium on the higher doses of diuretics  11/30/22 Swelling has improved dramatically in both legs.  The pitting edema has decreased to +1 or even trace in both feet.  He has lost 10 pounds.  He is wearing the compression hose every day.  However he has developed a tender patch of erythema however his left tibial tuberosity.  This is concerning for developing cellulitis versus septic bursitis.  He also has 2 pockets of fluid on his anterior right ankle that appear to be coagulated blood.  See the photograph below.  At that time, my plan was: First, with regards to the congestive heart failure, he has diuresed 10 pounds.  Swelling is much improved in his legs.  Continue the compression hose as this seems to be helping immensely.  For the time being we will continue spironolactone and 25 mg twice a day and Lasix 40 mg twice a day.  I will monitor his kidney function.  He continues to lose weight or if his kidney function worsens we will decrease Lasix back to 40 mg a day.  Second, then 2 lumps on his right lower extremity, I believe may be subcutaneous hematomas caused by the crease of the compression hose.  I would allow these to resolve spontaneously.  Third, the patch of erythematous skin over the anterior tibial tubercle either represent cellulitis versus early septic bursitis.  Will try the patient on Keflex 500 mg 3 times a day for 1 week and then reassess.  If worsening he will need to see orthopedics.  12/14/22  Patient has  developed a 2 cm x 3-1/2 cm ulcer on the dorsum of his left foot as shown above.  The skin over the anterior tibial tubercle of his left knee is less erythematous today.  It is no longer warm to the touch although it is still slightly tender.  I am concerned that the patient may have septic bursitis that area.  Previously it was very swollen.  It seems to have responded to Keflex but it has not entirely resolved and sent him.  There is no redness surrounding the ulcer on the dorsum of  his foot.  The wound bed is tender to the touch.  At that time, my plan was: Patient recently had an echocardiogram that showed an ejection fraction of 25%.  His heart rate is better controlled today.  He denies any shortness of breath.  He has an appoint to see cardiology on Monday.  I will defer to them whether they uptitrate his beta-blocker or whether they add Entresto to better manage his heart failure.  We will continue his Lasix and spironolactone as currently prescribed as this seems to be keeping the patient euvolemic.  I see no evidence of osteomyelitis of the left foot.  I performed a wet-to-dry dressing change today.  I used a curette to scrape the wound bottom to remove any necrotic debris.  I then applied sterile gauze soaked with saline to the wound bed covered this with nonadherent gauze and then wrapped the foot with Coban.  I recommended performing this dressing change on a daily basis.  I will see the patient back in 48 hours.  If we see good granulation tissue through debridement and dressing changes and the wound edges start to close, we will continue this.  If the wound on the foot does not improve, I would recommend an MRI of the foot to evaluate for underlying osteomyelitis and also obtain arterial Dopplers to evaluate for peripheral vascular disease.  I am concerned the patient may have septic bursitis in the superficial infrapatellar bursa.  Switch the patient to Bactrim double strength tablets twice  daily and consult orthopedics in case it is not improving.  12/16/22  On a positive note, the ulcer on the dorsum of his left foot appears better today.  There is healthy granulation tissue seen to appear in the wound bed.  There is less thick yellow drainage.  The wound edges themselves show healthy granulation tissue.  However, the skin over the anterior tibial tubercle is still erythematous and warm.  The bone itself in that area is tender to palpation.  The concern is for possible septic bursitis or even early osteomyelitis.  The patient is currently on Bactrim double strength tablets however the pain has not improved. 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    History of MI (myocardial infarction)    History of nuclear stress test 08/07/2010   dipyridamole; EKG negative for ischemia, low risk scan    Hyperlipidemia    Hyperplastic colon polyp 05/2008   Hypertension    Ischemic cardiomyopathy    EF 45%, with inferior wall motion abnormality    Myocardial infarction (HCC)    Valvular regurgitation    mitral and tricuspid (mild)   Past Surgical History:  Procedure Laterality Date   CARDIOVERSION N/A 08/16/2022   Procedure: CARDIOVERSION;  Surgeon: Jake Bathe, MD;  Location: [facility] IN[facility]ASIVE [facility];  Service: Cardiovascular;  Laterality: N/A;   CORONARY ARTERY BYPASS GRAFT  2000   LIMA to LAD, free IMA to OM2, sequential  graft to PLA & PLD   ESOPHAGOGASTRODUODENOSCOPY N/A 05/07/2013   Procedure: ESOPHAGOGASTRODUODENOSCOPY (EGD);  Surgeon: Iva Boop, MD;  Location: [facility] ENDOSCOPY;  Service: Endoscopy;  Laterality: N/A;   TEE WITHOUT CARDIOVERSION N/A 08/16/2022   Procedure: TRANSESOPHAGEAL ECHOCARDIOGRAM;  Surgeon: Jake Bathe, MD;  Location: [facility] INVASIVE CV LAB;  Service: Cardiovascular;  Laterality: N/A;   TRANSTHORACIC ECHOCARDIOGRAM  09/07/2010   EF 45-50%; LV systolic function mildly reduced; LA mildly dilated; mild-mod MR & mild-mod TR; aortic root sclerosis/calcification;    Current Outpatient Medications on File Prior to Visit  Medication Sig Dispense Refill   allopurinol (ZYLOPRIM) 300 MG tablet TAKE 1 TABLET BY MOUTH DAILY 90 tablet 0   apixaban (ELIQUIS) 5 MG TABS tablet Take 1 tablet (5 mg total) by mouth 2 (two) times daily. 60 tablet 5   Blood Glucose Monitoring Suppl (BLOOD GLUCOSE MONITOR SYSTEM) w/Device KIT 1 each by Does not apply route in the morning, at noon, and at bedtime. 1 kit 0   cephALEXin (KEFLEX) 500 MG capsule Take 1 capsule (500 mg total) by mouth 3 (three) times daily. (Patient not taking: Reported on 11/30/2022) 21 capsule 0   cephALEXin (KEFLEX) 500 MG capsule Take 1 capsule (500 mg total) by mouth 3 (three) times daily. 21 capsule 0   doxazosin (CARDURA) 2 MG tablet TAKE 1 TABLET BY MOUTH DAILY (Patient taking differently: Take 2 mg by mouth at bedtime.) 90 tablet 3   empagliflozin (JARDIANCE) 10 MG TABS tablet Take 1 tablet (10 mg total) by mouth daily. 30 tablet 2   furosemide (LASIX) 40 MG tablet Take 1 tablet (40 mg total) by mouth 2 (two) times daily. 180 tablet 3   glipiZIDE (GLIPIZIDE XL) 5 MG 24 hr tablet Take 1 tablet (5 mg total) by mouth daily with breakfast. 30 tablet 3   Glucose Blood (BLOOD GLUCOSE TEST STRIPS) STRP Use in the morning, at noon, and at bedtime. May substitute to any manufacturer covered by patient's insurance. 100 strip 2   HYDROcodone-acetaminophen (NORCO) 5-325 MG tablet Take 1 tablet by mouth every 6 (six) hours as needed for moderate pain (pain score 4-6). 30 tablet 0    insulin aspart (FIASP FLEXTOUCH) 100 UNIT/ML FlexTouch Pen Before each meal 3 times a day, 140-199 - 2 units, 200-250 - 4 units, 251-299 - 6 units,  300-349 - 8 units,  350 or above 10 units. 9 mL 0   Insulin Pen Needle 32G X 4 MM MISC For 4 times a day insulin injection under the skin, 1 month supply. 100 each 0   losartan (COZAAR) 25 MG tablet Take 1 tablet (25 mg total) by mouth daily. 90 tablet 1   metoprolol tartrate (LOPRESSOR) 50 MG tablet Take 2 tablets (100 mg total) by mouth 2 (two) times daily. 360 tablet 3   mirtazapine (REMERON) 30 MG tablet TAKE 1 TABLET BY MOUTH EVERY NIGHT AT BEDTIME FOR APPETITE 90 tablet 1   mupirocin ointment (BACTROBAN) 2 % SMARTSIG:1 Application Topical 2-3 Times Daily     pantoprazole (PROTONIX) 40 MG tablet Take 1 tablet (40 mg total) by mouth daily. 30 tablet 3   potassium chloride SA (KLOR-CON M) 20 MEQ tablet Take 1 tablet (20 mEq total) by mouth once for 1 dose. 7 tablet 0   simvastatin (ZOCOR) 40 MG tablet TAKE ONE TABLET BY MOUTH DAILY (Patient taking differently: Take 40 mg by mouth at bedtime.) 90 tablet 3   spironolactone (ALDACTONE) 25 MG tablet Take 1 tablet (  25 mg total) by mouth 2 (two) times daily. 180 tablet 3   sulfamethoxazole-trimethoprim (BACTRIM DS) 800-160 MG tablet Take 1 tablet by mouth 2 (two) times daily. 14 tablet 0   Tocilizumab (ACTEMRA IV) Inject 6 mg/kg into the vein every 28 (twenty-eight) days. Receives at [facility]     No current facility-administered medications on file prior to visit.     No Known Allergies Social History   Socioeconomic History   Marital status: Married    Spouse name: Not on file   Number of children: 1   Years of education: Not on file   Highest education level: Not on file  Occupational History   Not on file  Tobacco Use   Smoking status: Former    Current packs/day: 0.00    Types: Cigarettes    Quit date: 11/04/1985    Years since quitting: 37.1   Smokeless tobacco:  Never  Vaping Use   Vaping status: Never Used  Substance and Sexual Activity   Alcohol use: Not Currently    Alcohol/week: 2.0 - 3.0 standard drinks of alcohol    Types: 2 - 3 Cans of beer per week   Drug use: No   Sexual activity: Not on file  Other Topics Concern   Not on file  Social History Narrative   Not on file   Social Determinants of Health   Financial Resource Strain: Low Risk  (08/24/2022)   Overall Financial Resource Strain (CARDIA)    Difficulty of Paying Living Expenses: Not hard at all  Food Insecurity: No Food Insecurity (08/24/2022)   Hunger Vital Sign    Worried About Running Out of Food in the Last Year: Never true    Ran Out of Food in the Last Year: Never true  Transportation Needs: No Transportation Needs (08/24/2022)   PRAPARE - Administrator, Civil Service (Medical): No    Lack of Transportation (Non-Medical): No  Physical Activity: Sufficiently Active (07/10/2021)   Exercise Vital Sign    Days of Exercise per Week: 5 days    Minutes of Exercise per Session: 30 min  Stress: No Stress Concern Present (08/24/2022)   [employer] of Occupational Health - Occupational Stress Questionnaire    Feeling of Stress : Only a little  Social Connections: Socially Integrated (07/22/2022)   Social Connection and Isolation Panel [NHANES]    Frequency of Communication with Friends and Family: More than three times a week    Frequency of Social Gatherings with Friends and Family: More than three times a week    Attends Religious Services: More than 4 times per year    Active Member of [employer] or Organizations: Yes    Attends Banker Meetings: More than 4 times per year    Marital Status: Married  Catering manager Violence: Not At Risk (08/13/2022)   Humiliation, Afraid, Rape, and Kick questionnaire    Fear of Current or Ex-Partner: No    Emotionally Abused: No    Physically Abused: No    Sexually Abused: No     Review of Systems  All other  systems reviewed and are negative.      Objective:   Physical Exam Vitals reviewed.  Constitutional:      Appearance: He is well-developed.  Neck:     Thyroid: No thyromegaly.     Vascular: No JVD.  Cardiovascular:     Rate and Rhythm: Normal rate. Rhythm irregular.     Pulses:  Dorsalis pedis pulses are 1+ on the left side.       Posterior tibial pulses are 1+ on the left side.     Heart sounds: Normal heart sounds. No murmur heard. Pulmonary:     Effort: Pulmonary effort is normal. No respiratory distress.     Breath sounds: Normal breath sounds. No wheezing or rales.  Chest:     Chest wall: No tenderness.  Abdominal:     General: Bowel sounds are normal. There is no distension.     Palpations: Abdomen is soft. There is no mass.     Tenderness: There is no abdominal tenderness. There is no guarding or rebound.  Musculoskeletal:     Cervical back: Neck supple.     Right lower leg: Edema present.     Left lower leg: Edema present.       Feet:  Feet:     Left foot:     Skin integrity: Ulcer and skin breakdown present. No erythema or warmth.  Lymphadenopathy:     Cervical: No cervical adenopathy.  Skin:    Findings: Erythema present.           Assessment & Plan:  Osteomyelitis of left tibia, unspecified type (HCC) - Plan: MR TIBIA FIBULA LEFT W CONTRAST  Ulcerated, foot, left, with fat layer exposed (HCC) - Plan: MR FOOT LEFT W CONTRAST I am reassured by the appearance of the wound on his left foot today.  I performed another wet-to-dry dressing change.  Every day, I have instructed the patient to fill the wound with gauze soaked in sterile saline.  He will cover this with nonadherent gauze and then wrapped the foot and Coban.  Change this dressing daily.  I will see the patient back on Monday to recheck.  Hopefully the wound will close secondary intention.  However am going to try to expedite his orthopedic consult and arrange an urgent MRI of the left tibia  to evaluate for possible osteomyelitis versus septic bursitis.  Continue Bactrim for now

## 2022-12-16 NOTE — Progress Notes (Signed)
Office Visit Note  Patient: Donald Rios             Date of Birth: Apr 03, 1940           MRN: [id number]             PCP: [name], MD Referring: [name], MD Visit Date: 12/28/2022   Subjective:  Follow-up  Discussed the use of AI scribe software for clinical note transcription with the patient, who gave verbal consent to proceed.  History of Present Illness   Donald Rios is a 82 y.o. 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  He presents with ongoing issues of leg swelling and a recent episode of vision loss. The vision loss episode, which occurred on a bright day, was severe enough to prevent the patient from voting independently. The patient's vision returned after being in a dark environment, but he reports that his vision becomes blurred on bright days.  The patient has been receiving Actemra infusions to suppress the immune system and has been on a tapering course of prednisone. He is currently taking 20mg  of prednisone daily. The patient has not experienced any repeat episodes of vision changes since starting this treatment regimen.  In addition to these issues, the patient has been dealing with leg swelling, particularly in the right leg. An MRI revealed inflammation and air in the tissue but the bones appeared normal. The patient also has a hematoma on the top of his foot. The patient is not currently on any antibiotics, completed oral doxycycline since a week ago.   Previous HPI 11/09/2022 Donald Rios is a 82 y.o. 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 Prednisone is down to 25 mg anticipates decrease to 20 mg next week. He has no recurrence of headaches and no new vision changes. Biggest problem is his leg swelling,  ongoing despite wearing compressive socks, elevating feet, and on lasix 40 mg daily. Has some areas of skin breakage around the left ankle and heel.   Previous HPI 10/05/2022 Donald Rios is a 82 y.o. 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  Lab results at previous visit looked fine with normal serum inflammatory markers.  He had recent follow-up with Dr. Tanya Nones labs at that time showing improvement in platelets up to 128.  He still has extensive lower extremity edema.  He was needing paperwork for Actemra application today so had not progressed PA yet.   Previous HPI 09/06/22 Donald Rios is a 82 y.o. 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  He has a medical history significant for coronary artery disease status post CABG, ischemic cardiomyopathy, type 2 diabetes, gout, and PMR.  Symptoms started at the beginning of this month with a sudden onset of left eye floaters and blurriness in his vision initially in the lower part of the field and then across his entire eye.  This progressively worsened over 1 to 2 days he saw his ophthalmologist for this found to have disc edema with highly elevated sedimentation rate and CRP and sent to the emergency department for evaluation suspected giant cell arteritis.  On admission  he was also found to be in A-fib with RVR which is new for him.  After the onset of vision changes he also developed jaw pain this was present mildly at rest and worsened with chewing.  Did not experience any recurrence of previous shoulder or hip pain or stiffness.  In hospital he was found to have elevated serum inflammatory markers ultrasound examination of the temporal arteries indicated a positive halo sign.  He underwent MR angiography of the head and neck that showed extensive arterial disease and microvascular disease throughout without any definite  large vessel arteritis.  He was initially treated with high-dose intravenous steroids then transition to prednisone 60 mg daily.  Had improvement of symptoms jaw pain completely resolved after about 5 days in total.  Unfortunately left eye vision did not significantly improve has some light sensitivity and can see specks but no visual acuity. Right eye has some cloudiness in his vision this has been present since before these recent events and not changed since they started. He began gradual prednisone taper has decreased down to 20 mg daily at this time has not noticed any particular change or return of symptoms with decrease in the dose.  Does have increase in appetite, increase leg swelling, and increased blood sugars since starting the high-dose prednisone. He underwent TEE with direct-current cardioversion on August 5 unfortunately was reverted back into A-fib by time of cardiology follow-up 1 week later.  Findings also indicated decrease in his previous ejection fraction now indicating 30% with moderate to severe mitral regurgitation. He has a previous history of polymyalgia rheumatica was diagnosed and managed with Dr. Dierdre Forth at [facility] rheumatology from patient's report started in 2018.  This was characterized by fairly typical sounding shoulder and hip joint pain and stiffness and was treated with a long course of oral steroids.  He had been off any maintenance treatment for years and is no longer actively followed with their office.     Labs reviewed 08/12/2022 ESR 51 CRP 56.3 Lipids HDL 34 else WNL   08/2020 HCV neg   Imaging reviewed 08/19/22 BT Abdomen Pelvis W Contrast IMPRESSION: Colonic diverticulosis, without radiographic evidence of diverticulitis or other acute findings.   Cholelithiasis. No radiographic evidence of cholecystitis.   Small left pleural effusion.     08/14/22 Temporal Artery [place] Technically difficult and limited study, see indications above. No compression of  bilateral temporal arteries at proximal, mid, or distal  segments. Abnormal flow noted throughout. Summary:  Presence of a "halo" sign in the bilateral temporal artery suggests temporal arteritis.      08/13/22 MRI/MRA Head/Neck IMPRESSION: MRI HEAD IMPRESSION:   1. No acute intracranial abnormality. 2. 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 3. Innumerable punctate chronic micro hemorrhages involving both cerebral hemispheres. Overall pattern is nonspecific, with differential considerations including sequelae of prior cardiac bypass, chronic hypertension, cerebral amyloid angiopathy, prior trauma, or possibly vasculitis.   MRA HEAD IMPRESSION:   1. Negative intracranial MRA for large vessel occlusion. No hemodynamically significant or correctable stenosis. 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 5. 3 mm supraclinoid left ICA aneurysm.   MRA NECK IMPRESSION:   1. No acute vascular abnormality within the neck. 2. Mild atheromatous irregularity about both carotid artery systems within the neck without hemodynamically significant greater than 50% stenosis. 3. Patent vertebral arteries within the neck. Right vertebral artery dominant.  Review of Systems  Constitutional:  Positive for fatigue.  HENT:  Negative for mouth sores and mouth dryness.   Eyes:  Negative for dryness.  Respiratory:  Negative for shortness of breath.   Cardiovascular:  Negative for chest pain and palpitations.  Gastrointestinal:  Negative for blood in stool, constipation and diarrhea.  Endocrine: Negative for increased urination.  Genitourinary:  Negative for involuntary urination.  Musculoskeletal:  Positive for gait problem. Negative for joint pain, joint pain, joint swelling, myalgias, muscle weakness, morning stiffness, muscle tenderness and myalgias.  Skin:   Negative for color change, rash, hair loss and sensitivity to sunlight.  Allergic/Immunologic: Negative for susceptible to infections.  Neurological:  Negative for dizziness and headaches.  Hematological:  Negative for swollen glands.  Psychiatric/Behavioral:  Negative for depressed mood and sleep disturbance. The patient is not nervous/anxious.     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 08/30/2017   Cardiomyopathy, ischemic 12/23/2015   Chronic systolic congestive heart failure, NYHA class 1 (HCC) 12/23/2015   Acute gastric ulcer with hemorrhage 05/06/2013   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    Mild tricuspid regurgitation    Mitral regurgitation    Atrial enlargement, left    Hearing loss    Diverticulosis    Hiatal hernia    Gout    DM2 (diabetes mellitus, type 2) (HCC)    GERD (gastroesophageal reflux disease)    Mixed hyperlipidemia    Essential hypertension     Past Medical History:  Diagnosis Date   Acute gastric ulcer with hemorrhage 05/06/2013   Arthritis    Asthma    Atrial enlargement, left    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    History of MI (myocardial infarction)    History of nuclear stress test 08/07/2010   dipyridamole; EKG  negative for ischemia, low risk scan    Hyperlipidemia    Hyperplastic colon polyp 05/2008   Hypertension    Ischemic cardiomyopathy    EF 45%, with inferior wall motion abnormality    Myocardial infarction (HCC)    Valvular regurgitation    mitral and tricuspid (mild)    Family History  Problem Relation Age of Onset   Stroke Father    Hypertension Father    Diabetes Brother    Esophageal cancer Brother    Stroke Maternal Grandmother    Stroke Maternal Grandfather    Stomach cancer Maternal Aunt    Colon cancer Neg Hx    Rectal cancer Neg Hx    Past Surgical History:  Procedure Laterality Date   CARDIOVERSION N/A 08/16/2022   Procedure: CARDIOVERSION;  Surgeon: Jake Bathe, MD;  Location: [facility];  Service: Cardiovascular;  Laterality: N/A;  CORONARY ARTERY BYPASS GRAFT  2000   LIMA to LAD, free IMA to OM2, sequential graft to PLA & PLD   ESOPHAGOGASTRODUODENOSCOPY N/A 05/07/2013   Procedure: ESOPHAGOGASTRODUODENOSCOPY (EGD);  Surgeon: Iva Boop, MD;  Location: [facility] ENDOSCOPY;  Service: Endoscopy;  Laterality: N/A;   TEE WITHOUT CARDIOVERSION N/A 08/16/2022   Procedure: TRANSESOPHAGEAL ECHOCARDIOGRAM;  Surgeon: Jake Bathe, MD;  Location: [facility] INVASIVE CV LAB;  Service: Cardiovascular;  Laterality: N/A;   TRANSTHORACIC ECHOCARDIOGRAM  09/07/2010   EF 45-50%; LV systolic function mildly reduced; LA mildly dilated; mild-mod MR & mild-mod TR; aortic root sclerosis/calcification;    Social History   Social History Narrative   Not on file   Immunization History  Administered Date(s) Administered   Fluad Quad(high Dose 65+) 10/17/2018, 10/29/2019, 12/16/2020, 11/16/2021   Influenza Whole 11/22/2002, 11/07/2003, 10/06/2009   Influenza, High Dose Seasonal PF 10/17/2017   Influenza,inj,Quad PF,6+ Mos 11/02/2012, 10/01/2013, 10/04/2014, 10/03/2015, 10/11/2016   PFIZER(Purple Top)SARS-COV-2 Vaccination 02/20/2019, 03/17/2019, 11/10/2019   Pfizer Covid-19 Vaccine  Bivalent Booster 2yrs & up 11/06/2020   Pfizer(Comirnaty)Fall Seasonal Vaccine 12 years and older 12/31/2021   Pneumococcal Conjugate-13 01/26/2013   Pneumococcal Polysaccharide-23 10/27/1998, 03/31/2015   Td 03/27/2009   Tdap 05/24/2011, 10/22/2022   Zoster, Live 01/03/2012     Objective: Vital Signs: BP 112/70 (BP Location: Left Arm, Patient Position: Sitting, Cuff Size: Normal)   Pulse (!) 101   Resp 12   Ht 5\' 8"  (1.727 m)   Wt 148 lb (67.1 kg)   BMI 22.50 kg/m    Physical Exam Eyes:     Extraocular Movements: Extraocular movements intact.     Conjunctiva/sclera: Conjunctivae normal.  Cardiovascular:     Rate and Rhythm: Normal rate and regular rhythm.  Pulmonary:     Effort: Pulmonary effort is normal.     Breath sounds: Normal breath sounds.  Musculoskeletal:     Comments: 2+ bilateral pitting edema Left foot in dressing, large swelling/hematoma on top of left foot  Skin:    General: Skin is warm and dry.  Neurological:     Mental Status: He is alert.  Psychiatric:        Mood and Affect: Mood normal.      Musculoskeletal Exam:  Knees full ROM left knee erythema and warmth at anterior tibia patellar tendon insertion Ankles full ROM no tenderness or swelling MTPs full ROM no tenderness or swelling   Investigation: No additional findings.  Imaging: [place] Extrem Low Left Ltd Result Date: 12/31/2022 Limited musculoskeletal ultrasound of the left knee was performed today.  X-rays show the suprapatellar knee joint without effusion.  No cortical regularity of the patella.  The patellar tendon is intact but the distal portion has evidence of tendinopathy with thickening.  Most notable there is tendinosis with significant calcification in the distal patella at and just proximal to the tibial tubercle.  There is mild hyperemia in this location.  There is no prepatellar or infrapatellar bursitis.  Very mild cobblestoning of the superficial soft tissue, indicative of likely  resolving cellulitis.  There is no fluid or fluid pockets around the soft tissue on US examination.  MR TIBIA FIBULA LEFT WO CONTRAST Result Date: 12/28/2022 CLINICAL DATA:  Redness about the anterior aspect of the left tibial tubercle for 5 weeks. EXAM: MRI OF LOWER LEFT EXTREMITY WITHOUT CONTRAST TECHNIQUE: Multiplanar, multisequence MR imaging of the left lower leg was performed. No intravenous contrast was administered. COMPARISON:  None Available. FINDINGS: Bones/Joint/Cartilage There is no marrow edema  to suggest osteomyelitis. No evidence of septic joint is seen. No fracture, stress change or focal lesion. Ligaments Intact. Muscles and Tendons Intact. Intrasubstance increased T2 signal is seen in the distal aspect the patellar tendon consistent with tendinosis. Soft tissues Mild subcutaneous edema is seen anterior to the tibial tubercle. No focal fluid collection is identified. IMPRESSION: 1. Mild subcutaneous edema anterior to the tibial tubercle could be due to cellulitis. No abscess or osteomyelitis is seen. 2. Distal patellar tendinosis. Electronically Signed   By: Drusilla Kanner M.D.   On: 12/28/2022 08:48   MR FOOT LEFT WO CONTRAST Result Date: 12/28/2022 CLINICAL DATA:  Open wound on the left foot. EXAM: MRI OF THE LEFT FOOT WITHOUT CONTRAST TECHNIQUE: Multiplanar, multisequence MR imaging of the left foot was performed. No intravenous contrast was administered. COMPARISON:  None Available. FINDINGS: Bones/Joint/Cartilage: No marrow edema to suggest osteomyelitis is identified. No joint effusion. No fracture or dislocation. Scattered, mild appearing osteoarthritis is most notable at first MTP joint. Ligaments Intact. Muscles and Tendons Intact.  No intramuscular fluid collection. Soft tissues There is a focal appearing fluid collection in the dorsal subcutaneous tissues of the foot measuring approximately 2.7 cm transverse by to 1 cm craniocaudal by 5 cm long. The collection is T2  hyperintense with T1 hyperintensity within as well. No other focal fluid collection is identified. IMPRESSION: 1. Focal fluid collection in the dorsal subcutaneous tissues of the foot could be due to abscess hematoma. No evidence of osteomyelitis or septic arthritis. 2. Mild appearing osteoarthritis. Electronically Signed   By: Drusilla Kanner M.D.   On: 12/28/2022 08:45    Recent Labs: Lab Results  Component Value Date   WBC 6.7 12/28/2022   HGB 11.4 (L) 12/28/2022   PLT 113 (L) 12/28/2022   NA 142 12/31/2022   K 5.1 12/31/2022   CL 110 12/31/2022   CO2 26 12/31/2022   GLUCOSE 193 (H) 12/31/2022   BUN 16 12/31/2022   CREATININE 1.11 12/31/2022   BILITOT 1.5 (H) 12/28/2022   ALKPHOS 54 12/02/2022   AST 15 12/28/2022   ALT 16 12/28/2022   PROT 5.2 (L) 12/28/2022   ALBUMIN 3.8 12/02/2022   CALCIUM 8.7 12/31/2022   GFRAA 72 09/13/2019   QFTBGOLDPLUS NEGATIVE 10/20/2022    Speciality Comments: No specialty comments available.  Procedures:  No procedures performed Allergies: Patient has no known allergies.   Assessment / Plan:     Visit Diagnoses: Temporal arteritis (HCC) - Plan: Sedimentation rate, C-reactive protein, predniSONE (DELTASONE) 5 MG tablet No recent vision changes since starting Actemra infusions and Prednisone. Currently on Prednisone 20mg  daily. Actemra 6 mg/kg IV q4wks. -Plan to taper Prednisone over the next few weeks, assuming inflammatory markers (sedimentation rate and C-reactive protein) remain normal. -Check inflammatory markers today.  High risk medication use - actemra IV 4 days prior without complication. - Plan: CBC with Differential/Platelet, COMPLETE METABOLIC PANEL WITH GFR Concern possible left leg cellulitis no systemic infections reported. -Checking CBC and CMP medication monitoring on Actemra and prednisone  Long term (current) use of systemic steroids - prednisone taper down to 20 mg next week then slow to 2.5 mg increments week by week  afterwards.   Right Leg Swelling Recent MRI showed inflammation and air in the tissue, suggestive of infection or maybe residual inflammation, and a hematoma in the foot. No current antibiotic treatment. -Continue follow-up with Dr. Tanya Nones for wound care and management. -Plan to recheck leg swelling on Friday with Dr. Tanya Nones.  Chronic systolic  congestive heart failure, NYHA class 1 (HCC) Lower Extremity Edema Likely exacerbated by Prednisone. -Plan to taper Prednisone, which should help reduce edema. -Consider restarting diuretic therapy if edema persists or worsens, pending evaluation on Friday.    Orders: Orders Placed This Encounter  Procedures   Sedimentation rate   C-reactive protein   CBC with Differential/Platelet   COMPLETE METABOLIC PANEL WITH GFR   Meds ordered this encounter  Medications   predniSONE (DELTASONE) 5 MG tablet    Sig: Take 3 tablets (15 mg total) by mouth daily with breakfast for 14 days, THEN 2.5 tablets (12.5 mg total) daily with breakfast for 14 days, THEN 2 tablets (10 mg total) daily with breakfast for 14 days.    Dispense:  105 tablet    Refill:  0     Follow-Up Instructions: Return in 6 weeks (on 02/08/2023) for GCA on TOC/GC taper f/u 6wks.   Fuller Plan, MD  Note - This record has been created using [employer].  Chart creation errors have been sought, but may not always  have been located. Such creation errors do not reflect on  the standard of medical care.

## 2022-12-17 ENCOUNTER — Telehealth: Payer: Self-pay | Admitting: Family Medicine

## 2022-12-17 NOTE — Telephone Encounter (Signed)
Copied from CRM [id number]. Topic: Referral - Question >> Dec 16, 2022  2:58 PM [name] wrote: Reason for CRM: [place] [facility] is calling because they receive 2 referrals for patient MRI and the referrals are incorrect and needs to be correct callback number [phone] ext [phone]

## 2022-12-20 ENCOUNTER — Ambulatory Visit: Payer: Medicare Other | Admitting: Internal Medicine

## 2022-12-20 ENCOUNTER — Ambulatory Visit (INDEPENDENT_AMBULATORY_CARE_PROVIDER_SITE_OTHER): Payer: Medicare Other | Admitting: Family Medicine

## 2022-12-20 ENCOUNTER — Other Ambulatory Visit: Payer: Self-pay | Admitting: Family Medicine

## 2022-12-20 ENCOUNTER — Encounter: Payer: Self-pay | Admitting: Family Medicine

## 2022-12-20 VITALS — BP 120/62 | HR 95 | Temp 97.8°F | Ht 68.0 in | Wt 145.4 lb

## 2022-12-20 DIAGNOSIS — M869 Osteomyelitis, unspecified: Secondary | ICD-10-CM | POA: Diagnosis not present

## 2022-12-20 DIAGNOSIS — I5021 Acute systolic (congestive) heart failure: Secondary | ICD-10-CM

## 2022-12-20 DIAGNOSIS — L97522 Non-pressure chronic ulcer of other part of left foot with fat layer exposed: Secondary | ICD-10-CM

## 2022-12-20 DIAGNOSIS — B9689 Other specified bacterial agents as the cause of diseases classified elsewhere: Secondary | ICD-10-CM

## 2022-12-20 NOTE — Telephone Encounter (Signed)
Copied from CRM [id number]. Topic: General - Other >> Dec 20, 2022 10:57 AM Colletta Maryland S wrote: Reason for CRM: Su Ley from [facility] called in stating both MRIs are wrong, both say with contrast and need to say without contrast or with or without contrast, location also currently does not have stat, callback number is [phone]   Called and spoke w/[place] [facility] , per [place] it looks like it has been fixed w/the correct order. And will go ahead and schedule pt.

## 2022-12-20 NOTE — Progress Notes (Signed)
Wt Readings from Last 3 Encounters:  12/20/22 145 lb 6.4 oz (66 kg)  12/16/22 149 lb 12.8 oz (67.9 kg)  12/14/22 149 lb 9.6 oz (67.9 kg)   Wt Readings from Last 3 Encounters:  12/20/22 145 lb 6.4 oz (66 kg)  12/16/22 149 lb 12.8 oz (67.9 kg)  12/14/22 149 lb 9.6 oz (67.9 kg)     Subjective:    Patient ID: Donald Rios, male    DOB: Mar 25, 1940, 82 y.o.   MRN: [id number]  11/15/22  Despite having increased his Lasix from 40 mg once a day to 40 mg twice a day, the patient presents with significant edema in both lower extremities.  He has +2 edema in his feet and in his ankles +1 edema to his knees.  He denies any shortness of breath or chest pain.  He has purpura on both legs due to the vascular congestion exacerbated by Eliquis. Wt Readings from Last 3 Encounters:  12/20/22 145 lb 6.4 oz (66 kg)  12/16/22 149 lb 12.8 oz (67.9 kg)  12/14/22 149 lb 9.6 oz (67.9 kg)   At that time, my plan was: Patient is clearly third spacing peripheral edema.  Therefore increasing diuretic is ineffective as the fluid is no longer in the intravascular space.  Recommended putting the patient in Unna boots bilaterally which..  Continue Lasix 40 mg twice daily and recheck on Thursday.  At that point we will repeat BMP to monitor electrolytes and likely increase dose of spironolactone.  11/18/22 Patient continues to have profound +2 pitting edema in both feet.  His toes are swollen.  The dorsums of both feet are swollen.  There is a large vesicle on the top of his left foot just proximal to the third and fourth MTP joints.  Patient reports aching pain in both feet.  The swelling diminishes above the ankle.  At that time, my plan was: Continue Lasix at current doses.  Increase spironolactone to 25 mg twice daily.  Unna boots were replaced bilaterally.  Recheck in the morning and check renal function at that time.  Consider home SCD's  11/19/22 Swelling has improved and the patient's feet.  He still has pitting  edema at a +1 level in both feet.  The swelling in his shins to his knees has not improved.  However the una boots were extremely painful for him.  At that time, my plan was: Recommended knee-high compression hose 15 to 20 mmHg.  Continue spironolactone 25 mg twice daily with the Lasix.  Check a BMP today.  Recheck BMP Wednesday next week to monitor renal function.  Recheck via telephone on Monday to see how the swelling is doing.  11/23/22 Wt Readings from Last 3 Encounters:  12/20/22 145 lb 6.4 oz (66 kg)  12/16/22 149 lb 12.8 oz (67.9 kg)  12/14/22 149 lb 9.6 oz (67.9 kg)   Since stopping the Unna boot compression wraps, the patient has gained 8 pounds.  He has +2 pitting edema in both legs to his knees.  He is too weak to be able to put on his compression hose.  His wife is too weak to be able to help him put on the compression hose.  As result he has not been using any compression therapy.  Patient suggest fraction of 30%.  Currently on metoprolol losartan.  He is taking Lasix 40 mg once daily and spironolactone 25 mg twice daily.  At that time, my plan was: Patient appears fluid overloaded.  Increase Lasix to 40 mg twice daily.  Continue spironolactone 25 mg twice daily.  Continue metoprolol and losartan.  I believe the patient would be better on Entresto compared to losartan given his ejection fraction.  He has an appointment next week to see his cardiologist.  I will defer this decision to his cardiologist.  Monitor his potassium and renal function today with a BMP.  Strongly encouraged the patient to perform compression therapy at home.  However he is unable to get on his compression hose.  Therefore I will see if we get patient fitted for half leg bilateral SCDs.  11/25/22 Patient is here today with compression hose.  Neither he or his wife are able to put them on his legs because they are too swollen.  Therefore they are asking me to help put on the compression hose which I am happy to do.   His weight has not changed nor has the swelling.  He denies any chest pain or shortness of breath.  At that time, my plan was: Patient's last renal function had improved 2 days ago despite increasing his spironolactone.  Therefore I will not change the dose of spironolactone or Lasix.  I will increase metoprolol to 100 mg twice daily to try to decrease some of the tachycardia to increase cardiac output.  I helped the patient put on compression hose today and advised him to wear this until I see him next week.  At that time we will decide about switching losartan to [facility] and we will likely repeat his renal function to monitor his potassium on the higher doses of diuretics  11/30/22 Swelling has improved dramatically in both legs.  The pitting edema has decreased to +1 or even trace in both feet.  He has lost 10 pounds.  He is wearing the compression hose every day.  However he has developed a tender patch of erythema however his left tibial tuberosity.  This is concerning for developing cellulitis versus septic bursitis.  He also has 2 pockets of fluid on his anterior right ankle that appear to be coagulated blood.  See the photograph below.  At that time, my plan was: First, with regards to the congestive heart failure, he has diuresed 10 pounds.  Swelling is much improved in his legs.  Continue the compression hose as this seems to be helping immensely.  For the time being we will continue spironolactone and 25 mg twice a day and Lasix 40 mg twice a day.  I will monitor his kidney function.  He continues to lose weight or if his kidney function worsens we will decrease Lasix back to 40 mg a day.  Second, then 2 lumps on his right lower extremity, I believe may be subcutaneous hematomas caused by the crease of the compression hose.  I would allow these to resolve spontaneously.  Third, the patch of erythematous skin over the anterior tibial tubercle either represent cellulitis versus early septic  bursitis.  Will try the patient on Keflex 500 mg 3 times a day for 1 week and then reassess.  If worsening he will need to see orthopedics.  12/14/22  Patient has developed a 2 cm x 3-1/2 cm ulcer on the dorsum of his left foot as shown above.  The skin over the anterior tibial tubercle of his left knee is less erythematous today.  It is no longer warm to the touch although it is still slightly tender.  I am concerned that the patient may have septic  bursitis that area.  Previously it was very swollen.  It seems to have responded to Keflex but it has not entirely resolved and sent him.  There is no redness surrounding the ulcer on the dorsum of his foot.  The wound bed is tender to the touch.  At that time, my plan was: Patient recently had an echocardiogram that showed an ejection fraction of 25%.  His heart rate is better controlled today.  He denies any shortness of breath.  He has an appoint to see cardiology on Monday.  I will defer to them whether they uptitrate his beta-blocker or whether they add Entresto to better manage his heart failure.  We will continue his Lasix and spironolactone as currently prescribed as this seems to be keeping the patient euvolemic.  I see no evidence of osteomyelitis of the left foot.  I performed a wet-to-dry dressing change today.  I used a curette to scrape the wound bottom to remove any necrotic debris.  I then applied sterile gauze soaked with saline to the wound bed covered this with nonadherent gauze and then wrapped the foot with Coban.  I recommended performing this dressing change on a daily basis.  I will see the patient back in 48 hours.  If we see good granulation tissue through debridement and dressing changes and the wound edges start to close, we will continue this.  If the wound on the foot does not improve, I would recommend an MRI of the foot to evaluate for underlying osteomyelitis and also obtain arterial Dopplers to evaluate for peripheral vascular  disease.  I am concerned the patient may have septic bursitis in the superficial infrapatellar bursa.  Switch the patient to Bactrim double strength tablets twice daily and consult orthopedics in case it is not improving.  12/16/22  On a positive note, the ulcer on the dorsum of his left foot appears better today.  There is healthy granulation tissue seen to appear in the wound bed.  There is less thick yellow drainage.  The wound edges themselves show healthy granulation tissue.  However, the skin over the anterior tibial tubercle is still erythematous and warm.  The bone itself in that area is tender to palpation.  The concern is for possible septic bursitis or even early osteomyelitis.  The patient is currently on Bactrim double strength tablets however the pain has not improved.  At that time, my plan was: I am reassured by the appearance of the wound on his left foot today.  I performed another wet-to-dry dressing change.  Every day, I have instructed the patient to fill the wound with gauze soaked in sterile saline.  He will cover this with nonadherent gauze and then wrapped the foot and Coban.  Change this dressing daily.  I will see the patient back on Monday to recheck.  Hopefully the wound will close secondary intention.  However am going to try to expedite his orthopedic consult and arrange an urgent MRI of the left tibia to evaluate for possible osteomyelitis versus septic bursitis.  Continue Bactrim for now  12/20/22  The wound on the patient's foot continues to improve.  The wound bed continues to show improvement in the granulation tissue with less drainage.  Also the circumference of the wound seems to be improving.  I am reassured by this.  The patient is continuing to do daily wet-to-dry dressing changes.  However he continues to have point tenderness over the anterior tibial tubercle.  The skin there remains  slightly erythematous and warm to the touch.  The patient reports continued pain  with standing.  The MRI has not yet been scheduled. 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    History of MI (myocardial infarction)    History of nuclear stress test 08/07/2010   dipyridamole; EKG negative for ischemia, low risk scan    Hyperlipidemia    Hyperplastic colon polyp 05/2008   Hypertension    Ischemic cardiomyopathy    EF 45%, with inferior wall motion abnormality    Myocardial infarction (HCC)    Valvular regurgitation    mitral and tricuspid (mild)   Past Surgical History:  Procedure Laterality Date   CARDIOVERSION N/A 08/16/2022   Procedure: CARDIOVERSION;  Surgeon: Jake Bathe, MD;  Location: [facility] INVASIVE CV LAB;  Service: Cardiovascular;  Laterality: N/A;   CORONARY ARTERY BYPASS GRAFT  2000   LIMA to LAD, free IMA to OM2, sequential graft to PLA & PLD   ESOPHAGOGASTRODUODENOSCOPY N/A 05/07/2013   Procedure: ESOPHAGOGASTRODUODENOSCOPY (EGD);  Surgeon: Iva Boop, MD;  Location: [facility] ENDOSCOPY;  Service: Endoscopy;  Laterality: N/A;   TEE WITHOUT CARDIOVERSION N/A 08/16/2022   Procedure: TRANSESOPHAGEAL ECHOCARDIOGRAM;  Surgeon: Jake Bathe, MD;  Location: [facility] INVASIVE CV LAB;  Service: Cardiovascular;  Laterality: N/A;   TRANSTHORACIC ECHOCARDIOGRAM  09/07/2010   EF 45-50%; LV systolic function mildly reduced; LA mildly dilated; mild-mod MR & mild-mod TR; aortic root sclerosis/calcification;    Current Outpatient Medications on File Prior to Visit  Medication Sig Dispense Refill   allopurinol (ZYLOPRIM) 300 MG tablet TAKE 1  TABLET BY MOUTH DAILY 90 tablet 0   apixaban (ELIQUIS) 5 MG TABS tablet Take 1 tablet (5 mg total) by mouth 2 (two) times daily. 60 tablet 5   Blood Glucose Monitoring Suppl (BLOOD GLUCOSE MONITOR SYSTEM) w/Device KIT 1 each by Does not apply route in the morning, at noon, and at bedtime. 1 kit 0   cephALEXin (KEFLEX) 500 MG capsule Take 1 capsule (500 mg total) by mouth 3 (three) times daily. 21 capsule 0   cephALEXin (KEFLEX) 500 MG capsule Take 1 capsule (500 mg total) by mouth 3 (three) times daily. 21 capsule 0   doxazosin (CARDURA) 2 MG tablet TAKE 1 TABLET BY MOUTH DAILY (Patient taking differently: Take 2 mg by mouth at bedtime.) 90 tablet 3   empagliflozin (JARDIANCE) 10 MG TABS tablet Take 1 tablet (10 mg total) by mouth daily. 30 tablet 2   furosemide (LASIX) 40 MG tablet Take 1 tablet (40 mg total) by mouth 2 (two) times daily. 180 tablet 3   glipiZIDE (GLIPIZIDE XL) 5 MG 24 hr tablet Take 1 tablet (5 mg total) by mouth daily with breakfast. 30 tablet 3   Glucose Blood (BLOOD GLUCOSE TEST STRIPS) STRP Use in the morning, at noon, and at bedtime. May substitute to any manufacturer covered by patient's insurance. 100 strip 2   HYDROcodone-acetaminophen (NORCO) 5-325 MG tablet Take  1 tablet by mouth every 6 (six) hours as needed for moderate pain (pain score 4-6). 30 tablet 0   insulin aspart (FIASP FLEXTOUCH) 100 UNIT/ML FlexTouch Pen Before each meal 3 times a day, 140-199 - 2 units, 200-250 - 4 units, 251-299 - 6 units,  300-349 - 8 units,  350 or above 10 units. 9 mL 0   Insulin Pen Needle 32G X 4 MM MISC For 4 times a day insulin injection under the skin, 1 month supply. 100 each 0   losartan (COZAAR) 25 MG tablet Take 1 tablet (25 mg total) by mouth daily. 90 tablet 1   metoprolol tartrate (LOPRESSOR) 50 MG tablet Take 2 tablets (100 mg total) by mouth 2 (two) times daily. 360 tablet 3   mirtazapine (REMERON) 30 MG tablet TAKE 1 TABLET BY MOUTH EVERY NIGHT AT BEDTIME FOR APPETITE 90  tablet 1   mupirocin ointment (BACTROBAN) 2 % SMARTSIG:1 Application Topical 2-3 Times Daily     pantoprazole (PROTONIX) 40 MG tablet Take 1 tablet (40 mg total) by mouth daily. 30 tablet 3   simvastatin (ZOCOR) 40 MG tablet TAKE ONE TABLET BY MOUTH DAILY (Patient taking differently: Take 40 mg by mouth at bedtime.) 90 tablet 3   spironolactone (ALDACTONE) 25 MG tablet Take 1 tablet (25 mg total) by mouth 2 (two) times daily. 180 tablet 3   sulfamethoxazole-trimethoprim (BACTRIM DS) 800-160 MG tablet Take 1 tablet by mouth 2 (two) times daily. 14 tablet 0   Tocilizumab (ACTEMRA IV) Inject 6 mg/kg into the vein every 28 (twenty-eight) days. Receives at [employer] [facility]     potassium chloride SA (KLOR-CON M) 20 MEQ tablet Take 1 tablet (20 mEq total) by mouth once for 1 dose. 7 tablet 0   No current facility-administered medications on file prior to visit.     No Known Allergies Social History   Socioeconomic History   Marital status: Married    Spouse name: Not on file   Number of children: 1   Years of education: Not on file   Highest education level: Not on file  Occupational History   Not on file  Tobacco Use   Smoking status: Former    Current packs/day: 0.00    Types: Cigarettes    Quit date: 11/04/1985    Years since quitting: 37.1   Smokeless tobacco: Never  Vaping Use   Vaping status: Never Used  Substance and Sexual Activity   Alcohol use: Not Currently    Alcohol/week: 2.0 - 3.0 standard drinks of alcohol    Types: 2 - 3 Cans of beer per week   Drug use: No   Sexual activity: Not on file  Other Topics Concern   Not on file  Social History Narrative   Not on file   Social Determinants of Health   Financial Resource Strain: Low Risk  (08/24/2022)   Overall Financial Resource Strain (CARDIA)    Difficulty of Paying Living Expenses: Not hard at all  Food Insecurity: No Food Insecurity (08/24/2022)   Hunger Vital Sign    Worried About Running Out of  Food in the Last Year: Never true    Ran Out of Food in the Last Year: Never true  Transportation Needs: No Transportation Needs (08/24/2022)   PRAPARE - Administrator, Civil Service (Medical): No    Lack of Transportation (Non-Medical): No  Physical Activity: Sufficiently Active (07/10/2021)   Exercise Vital Sign    Days of Exercise  per Week: 5 days    Minutes of Exercise per Session: 30 min  Stress: No Stress Concern Present (08/24/2022)   [employer] of Occupational Health - Occupational Stress Questionnaire    Feeling of Stress : Only a little  Social Connections: Socially Integrated (07/22/2022)   Social Connection and Isolation Panel [NHANES]    Frequency of Communication with Friends and Family: More than three times a week    Frequency of Social Gatherings with Friends and Family: More than three times a week    Attends Religious Services: More than 4 times per year    Active Member of [employer] or Organizations: Yes    Attends Banker Meetings: More than 4 times per year    Marital Status: Married  Catering manager Violence: Not At Risk (08/13/2022)   Humiliation, Afraid, Rape, and Kick questionnaire    Fear of Current or Ex-Partner: No    Emotionally Abused: No    Physically Abused: No    Sexually Abused: No     Review of Systems  All other systems reviewed and are negative.      Objective:   Physical Exam Vitals reviewed.  Constitutional:      Appearance: He is well-developed.  Neck:     Thyroid: No thyromegaly.     Vascular: No JVD.  Cardiovascular:     Rate and Rhythm: Normal rate. Rhythm irregular.     Pulses:          Dorsalis pedis pulses are 1+ on the left side.       Posterior tibial pulses are 1+ on the left side.     Heart sounds: Normal heart sounds. No murmur heard. Pulmonary:     Effort: Pulmonary effort is normal. No respiratory distress.     Breath sounds: Normal breath sounds. No wheezing or rales.  Chest:     Chest  wall: No tenderness.  Abdominal:     General: Bowel sounds are normal. There is no distension.     Palpations: Abdomen is soft. There is no mass.     Tenderness: There is no abdominal tenderness. There is no guarding or rebound.  Musculoskeletal:     Cervical back: Neck supple.     Right lower leg: Edema present.     Left lower leg: Edema present.       Feet:  Feet:     Left foot:     Skin integrity: Ulcer and skin breakdown present. No erythema or warmth.  Lymphadenopathy:     Cervical: No cervical adenopathy.  Skin:    Findings: Erythema present.           Assessment & Plan:  Acute systolic congestive heart failure (HCC) - Plan: Basic Metabolic Panel  Osteomyelitis of left tibia, unspecified type (HCC)  Ulcerated, foot, left, with fat layer exposed (HCC) On a positive note, I am reassured by the appearance of the wound on his feet.  Recommended continue daily wet-to-dry dressing changes.  I performed 1 today.  I gave the patient supplies to continue this at home and I will see him back on Friday to reassess.  Patient continues to deal with congestive heart however he has lost substantial weight.  Therefore we will check his renal function and potassium as we may need to wean the patient down on his Lasix and spironolactone.  If possible I would like to transition the patient to [facility].  Await the MRI of the left tibia and fibula to determine  the source of the patient's pain.  I am concerned about possible osteomyelitis in the left tibia.

## 2022-12-21 ENCOUNTER — Other Ambulatory Visit: Payer: Self-pay

## 2022-12-21 ENCOUNTER — Telehealth: Payer: Self-pay

## 2022-12-21 ENCOUNTER — Other Ambulatory Visit: Payer: Self-pay | Admitting: Family Medicine

## 2022-12-21 DIAGNOSIS — S00259A Superficial foreign body of unspecified eyelid and periocular area, initial encounter: Secondary | ICD-10-CM

## 2022-12-21 DIAGNOSIS — E1169 Type 2 diabetes mellitus with other specified complication: Secondary | ICD-10-CM

## 2022-12-21 LAB — BASIC METABOLIC PANEL
BUN/Creatinine Ratio: 15 (calc) (ref 6–22)
BUN: 29 mg/dL — ABNORMAL HIGH (ref 7–25)
CO2: 24 mmol/L (ref 20–32)
Calcium: 9.4 mg/dL (ref 8.6–10.3)
Chloride: 100 mmol/L (ref 98–110)
Creat: 1.95 mg/dL — ABNORMAL HIGH (ref 0.70–1.22)
Glucose, Bld: 107 mg/dL — ABNORMAL HIGH (ref 65–99)
Potassium: 4.1 mmol/L (ref 3.5–5.3)
Sodium: 139 mmol/L (ref 135–146)

## 2022-12-21 MED ORDER — GLIPIZIDE ER 5 MG PO TB24: 5.0000 mg | ORAL_TABLET | Freq: Every day | ORAL | 3 refills | Status: DC

## 2022-12-21 NOTE — Telephone Encounter (Signed)
Prescription Request  12/21/2022  LOV: 12/20/22  What is the name of the medication or equipment? glipiZIDE (GLIPIZIDE XL) 5 MG 24 hr tablet [[id number]]   Have you contacted your pharmacy to request a refill? Yes   Which pharmacy would you like this sent to?  [facility] [id number] [place], [place] - [address] [facility] [address] [place] [address] Phone: [phone] Fax: [phone]    Patient notified that their request is being sent to the clinical staff for review and that they should receive a response within 2 business days.   Please advise at [facility] [phone]

## 2022-12-22 ENCOUNTER — Ambulatory Visit
Admission: RE | Admit: 2022-12-22 | Discharge: 2022-12-22 | Disposition: A | Discharge status: Home / Self Care | Payer: Medicare Other | Source: Ambulatory Visit | Attending: Family Medicine | Admitting: Family Medicine

## 2022-12-22 DIAGNOSIS — S00259A Superficial foreign body of unspecified eyelid and periocular area, initial encounter: Secondary | ICD-10-CM

## 2022-12-24 ENCOUNTER — Encounter: Payer: Self-pay | Admitting: Family Medicine

## 2022-12-24 ENCOUNTER — Ambulatory Visit (INDEPENDENT_AMBULATORY_CARE_PROVIDER_SITE_OTHER): Payer: Medicare Other | Admitting: Family Medicine

## 2022-12-24 VITALS — BP 124/72 | HR 106 | Temp 97.6°F | Ht 68.0 in | Wt 146.4 lb

## 2022-12-24 DIAGNOSIS — L97522 Non-pressure chronic ulcer of other part of left foot with fat layer exposed: Secondary | ICD-10-CM | POA: Diagnosis not present

## 2022-12-24 NOTE — Progress Notes (Signed)
Subjective:    Patient ID: Donald Rios, male    DOB: 24-Sep-1940, 82 y.o.   MRN: [id number] Patient is here today for recheck.  His creatinine was elevated to 1.9 so I recommended holding the Lasix.  He has not done that yet.  The appointment has been scheduled for the MRI of his foot and leg her next Tuesday.  He continues to complain of pain over the left anterior tibial tubercle.  The erythema has faded he is still tender to palpation and there subcutaneous edema in that area.  The wound on his foot shows healthy red granulation tissue at the base.  The skin edges are not undermined.  The wound itself is 3 cm x 2-1/2 cm.  There is no evidence of any secondary cellulitis. 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    History of MI (myocardial infarction)    History of nuclear stress test 08/07/2010   dipyridamole; EKG negative for ischemia, low risk scan    Hyperlipidemia    Hyperplastic colon polyp 05/2008   Hypertension    Ischemic cardiomyopathy    EF 45%, with inferior wall motion abnormality    Myocardial infarction (HCC)    Valvular regurgitation    mitral and tricuspid (mild)   Past Surgical History:  Procedure Laterality Date   CARDIOVERSION N/A 08/16/2022   Procedure: CARDIOVERSION;  Surgeon: Jake Bathe, MD;  Location: [facility];  Service: Cardiovascular;  Laterality: N/A;   CORONARY ARTERY BYPASS GRAFT  2000   LIMA to LAD, free IMA to OM2, sequential graft to PLA & PLD   ESOPHAGOGASTRODUODENOSCOPY N/A 05/07/2013    Procedure: ESOPHAGOGASTRODUODENOSCOPY (EGD);  Surgeon: Iva Boop, MD;  Location: [facility] ENDOSCOPY;  Service: Endoscopy;  Laterality: N/A;   TEE WITHOUT CARDIOVERSION N/A 08/16/2022   Procedure: TRANSESOPHAGEAL ECHOCARDIOGRAM;  Surgeon: Jake Bathe, MD;  Location: [facility];  Service: Cardiovascular;  Laterality: N/A;   TRANSTHORACIC ECHOCARDIOGRAM  09/07/2010   EF 45-50%; LV systolic function mildly reduced; LA mildly dilated; mild-mod MR & mild-mod TR; aortic root sclerosis/calcification;    Current Outpatient Medications on File Prior to Visit  Medication Sig Dispense Refill   allopurinol (ZYLOPRIM) 300 MG tablet TAKE 1 TABLET BY MOUTH DAILY 90 tablet 0   apixaban (ELIQUIS) 5 MG TABS tablet Take 1 tablet (5 mg total) by mouth 2 (two) times daily. 60 tablet 5   Blood Glucose Monitoring Suppl (BLOOD GLUCOSE MONITOR SYSTEM) w/Device KIT 1 each by Does not apply route in the morning, at noon, and at bedtime. 1 kit 0   cephALEXin (KEFLEX) 500 MG capsule Take 1 capsule (500 mg total) by mouth 3 (three) times daily. 21 capsule 0   cephALEXin (KEFLEX) 500 MG capsule Take 1 capsule (500 mg total) by mouth 3 (three) times daily. 21 capsule 0   doxazosin (CARDURA) 2 MG tablet TAKE 1 TABLET BY MOUTH DAILY (Patient taking differently:  Take 2 mg by mouth at bedtime.) 90 tablet 3   empagliflozin (JARDIANCE) 10 MG TABS tablet Take 1 tablet (10 mg total) by mouth daily. 30 tablet 2   furosemide (LASIX) 40 MG tablet Take 1 tablet (40 mg total) by mouth 2 (two) times daily. 180 tablet 3   glipiZIDE (GLIPIZIDE XL) 5 MG 24 hr tablet Take 1 tablet (5 mg total) by mouth daily with breakfast. 30 tablet 3   Glucose Blood (BLOOD GLUCOSE TEST STRIPS) STRP Use in the morning, at noon, and at bedtime. May substitute to any manufacturer covered by patient's insurance. 100 strip 2   HYDROcodone-acetaminophen (NORCO) 5-325 MG tablet Take 1 tablet by mouth every 6 (six) hours as needed for moderate pain (pain score  4-6). 30 tablet 0   insulin aspart (FIASP FLEXTOUCH) 100 UNIT/ML FlexTouch Pen Before each meal 3 times a day, 140-199 - 2 units, 200-250 - 4 units, 251-299 - 6 units,  300-349 - 8 units,  350 or above 10 units. 9 mL 0   Insulin Pen Needle 32G X 4 MM MISC For 4 times a day insulin injection under the skin, 1 month supply. 100 each 0   losartan (COZAAR) 25 MG tablet Take 1 tablet (25 mg total) by mouth daily. 90 tablet 1   metoprolol tartrate (LOPRESSOR) 50 MG tablet Take 2 tablets (100 mg total) by mouth 2 (two) times daily. 360 tablet 3   mirtazapine (REMERON) 30 MG tablet TAKE 1 TABLET BY MOUTH EVERY NIGHT AT BEDTIME FOR APPETITE 90 tablet 1   mupirocin ointment (BACTROBAN) 2 % SMARTSIG:1 Application Topical 2-3 Times Daily     pantoprazole (PROTONIX) 40 MG tablet Take 1 tablet (40 mg total) by mouth daily. 30 tablet 3   simvastatin (ZOCOR) 40 MG tablet TAKE ONE TABLET BY MOUTH DAILY (Patient taking differently: Take 40 mg by mouth at bedtime.) 90 tablet 3   spironolactone (ALDACTONE) 25 MG tablet Take 1 tablet (25 mg total) by mouth 2 (two) times daily. 180 tablet 3   sulfamethoxazole-trimethoprim (BACTRIM DS) 800-160 MG tablet Take 1 tablet by mouth 2 (two) times daily. 14 tablet 0   Tocilizumab (ACTEMRA IV) Inject 6 mg/kg into the vein every 28 (twenty-eight) days. Receives at [facility]     potassium chloride SA (KLOR-CON M) 20 MEQ tablet Take 1 tablet (20 mEq total) by mouth once for 1 dose. 7 tablet 0   No current facility-administered medications on file prior to visit.     No Known Allergies Social History   Socioeconomic History   Marital status: Married    Spouse name: Not on file   Number of children: 1   Years of education: Not on file   Highest education level: Not on file  Occupational History   Not on file  Tobacco Use   Smoking status: Former    Current packs/day: 0.00    Types: Cigarettes    Quit date: 11/04/1985    Years since quitting: 37.1    Smokeless tobacco: Never  Vaping Use   Vaping status: Never Used  Substance and Sexual Activity   Alcohol use: Not Currently    Alcohol/week: 2.0 - 3.0 standard drinks of alcohol    Types: 2 - 3 Cans of beer per week   Drug use: No   Sexual activity: Not on file  Other Topics Concern   Not on file  Social History Narrative   Not on file   Social Drivers of  Health   Financial Resource Strain: Low Risk  (08/24/2022)   Overall Financial Resource Strain (CARDIA)    Difficulty of Paying Living Expenses: Not hard at all  Food Insecurity: No Food Insecurity (08/24/2022)   Hunger Vital Sign    Worried About Running Out of Food in the Last Year: Never true    Ran Out of Food in the Last Year: Never true  Transportation Needs: No Transportation Needs (08/24/2022)   PRAPARE - Administrator, Civil Service (Medical): No    Lack of Transportation (Non-Medical): No  Physical Activity: Sufficiently Active (07/10/2021)   Exercise Vital Sign    Days of Exercise per Week: 5 days    Minutes of Exercise per Session: 30 min  Stress: No Stress Concern Present (08/24/2022)   [employer] of Occupational Health - Occupational Stress Questionnaire    Feeling of Stress : Only a little  Social Connections: Socially Integrated (07/22/2022)   Social Connection and Isolation Panel [NHANES]    Frequency of Communication with Friends and Family: More than three times a week    Frequency of Social Gatherings with Friends and Family: More than three times a week    Attends Religious Services: More than 4 times per year    Active Member of [employer] or Organizations: Yes    Attends Banker Meetings: More than 4 times per year    Marital Status: Married  Catering manager Violence: Not At Risk (08/13/2022)   Humiliation, Afraid, Rape, and Kick questionnaire    Fear of Current or Ex-Partner: No    Emotionally Abused: No    Physically Abused: No    Sexually Abused: No     Review of  Systems  All other systems reviewed and are negative.      Objective:   Physical Exam Vitals reviewed.  Constitutional:      Appearance: He is well-developed.  Neck:     Thyroid: No thyromegaly.     Vascular: No JVD.  Cardiovascular:     Rate and Rhythm: Normal rate. Rhythm irregular.     Pulses:          Dorsalis pedis pulses are 1+ on the left side.       Posterior tibial pulses are 1+ on the left side.     Heart sounds: Normal heart sounds. No murmur heard. Pulmonary:     Effort: Pulmonary effort is normal. No respiratory distress.     Breath sounds: Normal breath sounds. No wheezing or rales.  Chest:     Chest wall: No tenderness.  Abdominal:     General: Bowel sounds are normal. There is no distension.     Palpations: Abdomen is soft. There is no mass.     Tenderness: There is no abdominal tenderness. There is no guarding or rebound.  Musculoskeletal:     Cervical back: Neck supple.     Right lower leg: Edema present.     Left lower leg: Edema present.       Feet:  Feet:     Left foot:     Skin integrity: Ulcer and skin breakdown present. No erythema or warmth.  Lymphadenopathy:     Cervical: No cervical adenopathy.  Skin:    Findings: Erythema present.           Assessment & Plan:  Ulcerated, foot, left, with fat layer exposed (HCC)  On a positive note, I continue to be reassured by the appearance of the wound on  his Left foot.  Recommended continue daily wet-to-dry dressing changes.  I performed 1 today.  I gave the patient supplies to continue this at home and I will see him back on Friday to reassess.  Discontinue Lasix but continue spironolactone.  Recheck renal function next week.  Await the results of the MRI to determine the cause of the pain and redness over the anterior tibial tubercle.  Patient has orthopedic consultation pending for next Friday.

## 2022-12-28 ENCOUNTER — Encounter: Payer: Self-pay | Admitting: Internal Medicine

## 2022-12-28 ENCOUNTER — Ambulatory Visit
Admission: RE | Admit: 2022-12-28 | Discharge: 2022-12-28 | Disposition: A | Discharge status: Home / Self Care | Payer: Medicare Other | Source: Ambulatory Visit | Attending: Family Medicine | Admitting: Family Medicine

## 2022-12-28 ENCOUNTER — Ambulatory Visit: Payer: Medicare Other | Attending: Internal Medicine | Admitting: Internal Medicine

## 2022-12-28 VITALS — BP 112/70 | HR 101 | Resp 12 | Ht 68.0 in | Wt 148.0 lb

## 2022-12-28 DIAGNOSIS — M316 Other giant cell arteritis: Secondary | ICD-10-CM

## 2022-12-28 DIAGNOSIS — S91302A Unspecified open wound, left foot, initial encounter: Secondary | ICD-10-CM | POA: Diagnosis not present

## 2022-12-28 DIAGNOSIS — R600 Localized edema: Secondary | ICD-10-CM

## 2022-12-28 DIAGNOSIS — Z7952 Long term (current) use of systemic steroids: Secondary | ICD-10-CM | POA: Diagnosis not present

## 2022-12-28 DIAGNOSIS — L97522 Non-pressure chronic ulcer of other part of left foot with fat layer exposed: Secondary | ICD-10-CM

## 2022-12-28 DIAGNOSIS — M869 Osteomyelitis, unspecified: Secondary | ICD-10-CM

## 2022-12-28 DIAGNOSIS — I5022 Chronic systolic (congestive) heart failure: Secondary | ICD-10-CM

## 2022-12-28 DIAGNOSIS — M19072 Primary osteoarthritis, left ankle and foot: Secondary | ICD-10-CM | POA: Diagnosis not present

## 2022-12-28 DIAGNOSIS — Z79899 Other long term (current) drug therapy: Secondary | ICD-10-CM | POA: Diagnosis not present

## 2022-12-28 DIAGNOSIS — B9689 Other specified bacterial agents as the cause of diseases classified elsewhere: Secondary | ICD-10-CM

## 2022-12-28 DIAGNOSIS — M7652 Patellar tendinitis, left knee: Secondary | ICD-10-CM | POA: Diagnosis not present

## 2022-12-28 MED ORDER — PREDNISONE 5 MG PO TABS: ORAL_TABLET | ORAL | 0 refills | Status: DC

## 2022-12-29 LAB — CBC WITH DIFFERENTIAL/PLATELET
Absolute Lymphocytes: 992 {cells}/uL (ref 850–3900)
Absolute Monocytes: 348 {cells}/uL (ref 200–950)
Basophils Absolute: 7 {cells}/uL (ref 0–200)
Basophils Relative: 0.1 %
Eosinophils Absolute: 0 {cells}/uL — ABNORMAL LOW (ref 15–500)
Eosinophils Relative: 0 %
HCT: 34.4 % — ABNORMAL LOW (ref 38.5–50.0)
Hemoglobin: 11.4 g/dL — ABNORMAL LOW (ref 13.2–17.1)
MCH: 34.2 pg — ABNORMAL HIGH (ref 27.0–33.0)
MCHC: 33.1 g/dL (ref 32.0–36.0)
MCV: 103.3 fL — ABNORMAL HIGH (ref 80.0–100.0)
MPV: 13.5 fL — ABNORMAL HIGH (ref 7.5–12.5)
Monocytes Relative: 5.2 %
Neutro Abs: 5353 {cells}/uL (ref 1500–7800)
Neutrophils Relative %: 79.9 %
Platelets: 113 10*3/uL — ABNORMAL LOW (ref 140–400)
RBC: 3.33 10*6/uL — ABNORMAL LOW (ref 4.20–5.80)
RDW: 14.8 % (ref 11.0–15.0)
Total Lymphocyte: 14.8 %
WBC: 6.7 10*3/uL (ref 3.8–10.8)

## 2022-12-29 LAB — COMPLETE METABOLIC PANEL WITH GFR
AG Ratio: 3.3 (calc) — ABNORMAL HIGH (ref 1.0–2.5)
ALT: 16 U/L (ref 9–46)
AST: 15 U/L (ref 10–35)
Albumin: 4 g/dL (ref 3.6–5.1)
Alkaline phosphatase (APISO): 50 U/L (ref 35–144)
BUN: 18 mg/dL (ref 7–25)
CO2: 21 mmol/L (ref 20–32)
Calcium: 8.9 mg/dL (ref 8.6–10.3)
Chloride: 109 mmol/L (ref 98–110)
Creat: 1.08 mg/dL (ref 0.70–1.22)
Globulin: 1.2 g/dL — ABNORMAL LOW (ref 1.9–3.7)
Glucose, Bld: 159 mg/dL — ABNORMAL HIGH (ref 65–99)
Potassium: 4.7 mmol/L (ref 3.5–5.3)
Sodium: 140 mmol/L (ref 135–146)
Total Bilirubin: 1.5 mg/dL — ABNORMAL HIGH (ref 0.2–1.2)
Total Protein: 5.2 g/dL — ABNORMAL LOW (ref 6.1–8.1)
eGFR: 69 mL/min/{1.73_m2} (ref >=60)

## 2022-12-29 LAB — C-REACTIVE PROTEIN: CRP: <3 mg/L (ref <8.0)

## 2022-12-29 LAB — SEDIMENTATION RATE: Sed Rate: 2 mm/h (ref 0–20)

## 2022-12-30 ENCOUNTER — Ambulatory Visit: Payer: Medicare Other | Admitting: *Deleted

## 2022-12-30 VITALS — BP 116/72 | HR 95 | Temp 97.7°F | Resp 16 | Ht 68.0 in | Wt 152.6 lb

## 2022-12-30 DIAGNOSIS — Z79899 Other long term (current) drug therapy: Secondary | ICD-10-CM

## 2022-12-30 DIAGNOSIS — M316 Other giant cell arteritis: Secondary | ICD-10-CM | POA: Diagnosis not present

## 2022-12-30 MED ORDER — DIPHENHYDRAMINE HCL 25 MG PO CAPS
25.0000 mg | ORAL_CAPSULE | Freq: Once | ORAL | Status: AC
  Administered 2022-12-30: 25 mg via ORAL
  Filled 2022-12-30: qty 1

## 2022-12-30 MED ORDER — ACETAMINOPHEN 325 MG PO TABS
650.0000 mg | ORAL_TABLET | Freq: Once | ORAL | Status: AC
  Administered 2022-12-30: 650 mg via ORAL
  Filled 2022-12-30: qty 2

## 2022-12-30 MED ORDER — SODIUM CHLORIDE 0.9 % IV SOLN
6.0000 mg/kg | Freq: Once | INTRAVENOUS | Status: AC
  Administered 2022-12-30: 402 mg via INTRAVENOUS
  Filled 2022-12-30: qty 20.1

## 2022-12-30 NOTE — Progress Notes (Signed)
Diagnosis: Temporal arteritis  Provider:  Chilton Greathouse MD  Procedure: IV Infusion  IV Type: Peripheral, IV Location: L Forearm  Actemra (Tocilizumab), Dose: 402 mg  Infusion Start Time: 1503 pm  Infusion Stop Time: 1610 pm  Post Infusion IV Care: Observation period completed and Peripheral IV Discontinued  Discharge: Condition: Good, Destination: Home . AVS Provided  Performed by:  Forrest Moron, RN

## 2022-12-31 ENCOUNTER — Ambulatory Visit: Payer: Self-pay

## 2022-12-31 ENCOUNTER — Ambulatory Visit (INDEPENDENT_AMBULATORY_CARE_PROVIDER_SITE_OTHER): Payer: Medicare Other | Admitting: Sports Medicine

## 2022-12-31 ENCOUNTER — Encounter: Payer: Self-pay | Admitting: Sports Medicine

## 2022-12-31 ENCOUNTER — Ambulatory Visit (INDEPENDENT_AMBULATORY_CARE_PROVIDER_SITE_OTHER): Payer: Medicare Other | Admitting: Family Medicine

## 2022-12-31 ENCOUNTER — Encounter: Payer: Self-pay | Admitting: Family Medicine

## 2022-12-31 VITALS — BP 124/78 | HR 95 | Temp 98.4°F | Ht 68.0 in | Wt 147.5 lb

## 2022-12-31 VITALS — BP 121/73 | HR 96 | Ht 68.0 in | Wt 152.0 lb

## 2022-12-31 DIAGNOSIS — L97522 Non-pressure chronic ulcer of other part of left foot with fat layer exposed: Secondary | ICD-10-CM

## 2022-12-31 DIAGNOSIS — M25562 Pain in left knee: Secondary | ICD-10-CM | POA: Diagnosis not present

## 2022-12-31 DIAGNOSIS — G8929 Other chronic pain: Secondary | ICD-10-CM

## 2022-12-31 DIAGNOSIS — M7650 Patellar tendinitis, unspecified knee: Secondary | ICD-10-CM | POA: Diagnosis not present

## 2022-12-31 DIAGNOSIS — L539 Erythematous condition, unspecified: Secondary | ICD-10-CM

## 2022-12-31 NOTE — Progress Notes (Signed)
Subjective:    Patient ID: Donald Rios, male    DOB: 01/27/1940, 82 y.o.   MRN: [id number] 12/24/22 Patient is here today for recheck.  His creatinine was elevated to 1.9 so I recommended holding the Lasix.  He has not done that yet.  The appointment has been scheduled for the MRI of his foot and leg her next Tuesday.  He continues to complain of pain over the left anterior tibial tubercle.  The erythema has faded he is still tender to palpation and there subcutaneous edema in that area.  The wound on his foot shows healthy red granulation tissue at the base.  The skin edges are not undermined.  The wound itself is 3 cm x 2-1/2 cm.  There is no evidence of any secondary cellulitis.  12/31/22 Since I last saw the patient, he had an MRI of the left tibia that showed no evidence of osteomyelitis over the anterior tibial tubercle.  Therefore I believe the patient may have some insertional tendinitis in his patellar tendon.  However there is no evidence of cellulitis or osteomyelitis.  He also had an MRI of his left foot.  This showed no evidence of osteomyelitis in the foot.  I believe that the edema that they noticed on the MRI was likely the wet-to-dry dressing changes but the patient is performing.  The wound is minimally smaller today.  Today on exam it is 3x2.1 cm.  The wound has healthy granulation tissue all throughout.  There is no drainage.  There is no pain.  There is no evidence of secondary cellulitis.  There is no foul odor.  Patient is also due to recheck a BMP to monitor his renal function.  He continues to hold Lasix.  He continues to manage his peripheral edema with compression hose. Past Medical History:  Diagnosis Date   Acute gastric ulcer with hemorrhage 05/06/2013   Arthritis    Asthma    Atrial enlargement, left    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    Cataract    COPD (chronic obstructive pulmonary disease) (HCC)    Diabetes mellitus    Diabetic nephropathy  (HCC)    Diverticulosis    ED (erectile dysfunction)    GERD (gastroesophageal reflux disease)    Gout    Hearing loss    Hiatal hernia 05/2008   EGD with HH and reflux esophagitis.    History of MI (myocardial infarction)    History of nuclear stress test 08/07/2010   dipyridamole; EKG negative for ischemia, low risk scan    Hyperlipidemia    Hyperplastic colon polyp 05/2008   Hypertension    Ischemic cardiomyopathy    EF 45%, with inferior wall motion abnormality    Myocardial infarction (HCC)    Valvular regurgitation    mitral and tricuspid (mild)   Past Surgical History:  Procedure Laterality Date   CARDIOVERSION N/A 08/16/2022   Procedure: CARDIOVERSION;  Surgeon: Jake Bathe, MD;  Location: [facility];  Service: Cardiovascular;  Laterality: N/A;   CORONARY ARTERY BYPASS GRAFT  2000   LIMA to LAD, free IMA to OM2, sequential graft to PLA & PLD   ESOPHAGOGASTRODUODENOSCOPY N/A 05/07/2013   Procedure: ESOPHAGOGASTRODUODENOSCOPY (EGD);  Surgeon: Iva Boop, MD;  Location: [facility] ENDOSCOPY;  Service: Endoscopy;  Laterality: N/A;   TEE WITHOUT CARDIOVERSION N/A 08/16/2022   Procedure: TRANSESOPHAGEAL ECHOCARDIOGRAM;  Surgeon: Jake Bathe, MD;  Location: [facility];  Service:  Cardiovascular;  Laterality: N/A;   TRANSTHORACIC ECHOCARDIOGRAM  09/07/2010   EF 45-50%; LV systolic function mildly reduced; LA mildly dilated; mild-mod MR & mild-mod TR; aortic root sclerosis/calcification;    Current Outpatient Medications on File Prior to Visit  Medication Sig Dispense Refill   allopurinol (ZYLOPRIM) 300 MG tablet TAKE 1 TABLET BY MOUTH DAILY 90 tablet 0   apixaban (ELIQUIS) 5 MG TABS tablet Take 1 tablet (5 mg total) by mouth 2 (two) times daily. 60 tablet 5   Blood Glucose Monitoring Suppl (BLOOD GLUCOSE MONITOR SYSTEM) w/Device KIT 1 each by Does not apply route in the morning, at noon, and at bedtime. 1 kit 0   doxazosin (CARDURA) 2 MG tablet TAKE 1 TABLET BY MOUTH  DAILY (Patient taking differently: Take 2 mg by mouth at bedtime.) 90 tablet 3   empagliflozin (JARDIANCE) 10 MG TABS tablet Take 1 tablet (10 mg total) by mouth daily. 30 tablet 2   furosemide (LASIX) 40 MG tablet Take 1 tablet (40 mg total) by mouth 2 (two) times daily. 180 tablet 3   glipiZIDE (GLIPIZIDE XL) 5 MG 24 hr tablet Take 1 tablet (5 mg total) by mouth daily with breakfast. 30 tablet 3   Glucose Blood (BLOOD GLUCOSE TEST STRIPS) STRP Use in the morning, at noon, and at bedtime. May substitute to any manufacturer covered by patient's insurance. 100 strip 2   HYDROcodone-acetaminophen (NORCO) 5-325 MG tablet Take 1 tablet by mouth every 6 (six) hours as needed for moderate pain (pain score 4-6). 30 tablet 0   insulin aspart (FIASP FLEXTOUCH) 100 UNIT/ML FlexTouch Pen Before each meal 3 times a day, 140-199 - 2 units, 200-250 - 4 units, 251-299 - 6 units,  300-349 - 8 units,  350 or above 10 units. (Patient taking differently: Before each meal 3 times a day, 140-199 - 2 units, 200-250 - 4 units, 251-299 - 6 units,  300-349 - 8 units,  350 or above 10 units.) 9 mL 0   Insulin Pen Needle 32G X 4 MM MISC For 4 times a day insulin injection under the skin, 1 month supply. 100 each 0   losartan (COZAAR) 25 MG tablet Take 1 tablet (25 mg total) by mouth daily. 90 tablet 1   metoprolol tartrate (LOPRESSOR) 50 MG tablet Take 2 tablets (100 mg total) by mouth 2 (two) times daily. 360 tablet 3   mirtazapine (REMERON) 30 MG tablet TAKE 1 TABLET BY MOUTH EVERY NIGHT AT BEDTIME FOR APPETITE 90 tablet 1   mupirocin ointment (BACTROBAN) 2 % SMARTSIG:1 Application Topical 2-3 Times Daily     pantoprazole (PROTONIX) 40 MG tablet Take 1 tablet (40 mg total) by mouth daily. 30 tablet 3   predniSONE (DELTASONE) 5 MG tablet Take 3 tablets (15 mg total) by mouth daily with breakfast for 14 days, THEN 2.5 tablets (12.5 mg total) daily with breakfast for 14 days, THEN 2 tablets (10 mg total) daily with breakfast for  14 days. 105 tablet 0   simvastatin (ZOCOR) 40 MG tablet TAKE ONE TABLET BY MOUTH DAILY (Patient taking differently: Take 40 mg by mouth at bedtime.) 90 tablet 3   spironolactone (ALDACTONE) 25 MG tablet Take 1 tablet (25 mg total) by mouth 2 (two) times daily. 180 tablet 3   sulfamethoxazole-trimethoprim (BACTRIM DS) 800-160 MG tablet Take 1 tablet by mouth 2 (two) times daily. 14 tablet 0   Tocilizumab (ACTEMRA IV) Inject 6 mg/kg into the vein every 28 (twenty-eight) days. Receives at [facility]     cephALEXin (KEFLEX) 500 MG capsule Take 1 capsule (500 mg total) by mouth 3 (three) times daily. (Patient not taking: Reported on 12/31/2022) 21 capsule 0   cephALEXin (KEFLEX) 500 MG capsule Take 1 capsule (500 mg total) by mouth 3 (three) times daily. (Patient not taking: Reported on 12/31/2022) 21 capsule 0   potassium chloride SA (KLOR-CON M) 20 MEQ tablet Take 1 tablet (20 mEq total) by mouth once for 1 dose. 7 tablet 0   No current facility-administered medications on file prior to visit.     No Known Allergies Social History   Socioeconomic History   Marital status: Married    Spouse name: Not on file   Number of children: 1   Years of education: Not on file   Highest education level: Not on file  Occupational History   Not on file  Tobacco Use   Smoking status: Former    Current packs/day: 0.00    Types: Cigarettes    Quit date: 11/04/1985    Years since quitting: 37.1   Smokeless tobacco: Never  Vaping Use   Vaping status: Never Used  Substance and Sexual Activity   Alcohol use: Not Currently    Alcohol/week: 2.0 - 3.0 standard drinks of alcohol    Types: 2 - 3 Cans of beer per week   Drug use: No   Sexual activity: Not on file  Other Topics Concern   Not on file  Social History Narrative   Not on file   Social Drivers of Health   Financial Resource Strain: Low Risk  (08/24/2022)   Overall Financial Resource Strain (CARDIA)    Difficulty of Paying  Living Expenses: Not hard at all  Food Insecurity: No Food Insecurity (08/24/2022)   Hunger Vital Sign    Worried About Running Out of Food in the Last Year: Never true    Ran Out of Food in the Last Year: Never true  Transportation Needs: No Transportation Needs (08/24/2022)   PRAPARE - Administrator, Civil Service (Medical): No    Lack of Transportation (Non-Medical): No  Physical Activity: Sufficiently Active (07/10/2021)   Exercise Vital Sign    Days of Exercise per Week: 5 days    Minutes of Exercise per Session: 30 min  Stress: No Stress Concern Present (08/24/2022)   [employer] of Occupational Health - Occupational Stress Questionnaire    Feeling of Stress : Only a little  Social Connections: Socially Integrated (07/22/2022)   Social Connection and Isolation Panel [NHANES]    Frequency of Communication with Friends and Family: More than three times a week    Frequency of Social Gatherings with Friends and Family: More than three times a week    Attends Religious Services: More than 4 times per year    Active Member of [employer] or Organizations: Yes    Attends Banker Meetings: More than 4 times per year    Marital Status: Married  Catering manager Violence: Not At Risk (08/13/2022)   Humiliation, Afraid, Rape, and Kick questionnaire    Fear of Current or Ex-Partner: No    Emotionally Abused: No    Physically Abused: No    Sexually Abused: No     Review of Systems  All other systems reviewed and are negative.      Objective:   Physical Exam Vitals reviewed.  Constitutional:      Appearance: He is well-developed.  Neck:  Thyroid: No thyromegaly.     Vascular: No JVD.  Cardiovascular:     Rate and Rhythm: Normal rate. Rhythm irregular.     Pulses:          Dorsalis pedis pulses are 1+ on the left side.       Posterior tibial pulses are 1+ on the left side.     Heart sounds: Normal heart sounds. No murmur heard. Pulmonary:     Effort:  Pulmonary effort is normal. No respiratory distress.     Breath sounds: Normal breath sounds. No wheezing or rales.  Chest:     Chest wall: No tenderness.  Abdominal:     General: Bowel sounds are normal. There is no distension.     Palpations: Abdomen is soft. There is no mass.     Tenderness: There is no abdominal tenderness. There is no guarding or rebound.  Musculoskeletal:     Cervical back: Neck supple.     Right lower leg: No edema.     Left lower leg: No edema.       Feet:  Feet:     Left foot:     Skin integrity: Ulcer and skin breakdown present. No erythema or warmth.  Lymphadenopathy:     Cervical: No cervical adenopathy.  Skin:    Findings: Lesion present. No erythema.           Assessment & Plan:  Ulcerated, foot, left, with fat layer exposed (HCC) - Plan: BASIC METABOLIC PANEL WITH GFR  On a positive note, I continue to be reassured by the appearance of the wound on his Left foot.  Recommended continue daily wet-to-dry dressing changes.  I performed 1 today.  I gave the patient supplies to continue this at home and I will see him back in 1 to reassess.  I did recommend a wound clinic referral given the slow progression of healing just for a second opinion.  I will check a BMP to monitor his renal function.  Patient can start using Lasix 40 mg a day as needed for swelling.  At the present time there is no indication to use Lasix.

## 2022-12-31 NOTE — Progress Notes (Signed)
Donald Rios - 82 y.o. male MRN [id number]  Date of birth: 11/09/1940  Office Visit Note: Visit Date: 12/31/2022 PCP: Donita Trivia Heffelfinger, MD Referred by: Donita Brantly Kalman, MD  Subjective: Chief Complaint  Patient presents with   Left Knee - Pain   HPI: Donald Rios is a pleasant 82 y.o. male who presents today for left knee pain with redness in the setting of currently treated ulcerated lesion of the affected left foot.  She states he has been treated for an ulcerated foot lesion by his primary doctor, Dr. Tanya Nones.  He has also been dealing with significant pitting edema in the legs from his congestive heart failure.  He has been treated for cellulitis by his primary physician back on 11/30/2022 with Keflex but this was not effective.  He then was placed on Bactrim for 7 days twice daily on 12/14/2022 which did help resolve the area of erythema around his knee.  He still has some tenderness around this area but is much improved from prior.  He has not had any fever or chills.  It has been about 10 days since completion of Bactrim and he has not noticed any increase in size of his redness.  *Note reviewed from Dr. Tanya Nones from 12/24/2022 seen for pain around the tibial tubercle with redness that did improve with Bactrim.  He is also treating him for an ulcerated foot wound.  Bactrim double strength started twice daily for 7 days on 12/14/2022.  *Note reviewed from rheumatology, Dr. Dimple Casey on 12/28/2022.  He is currently taking prednisone taper and started actemra for giant cell arteritis.  Pertinent labs reviewed today: Lab Results  Component Value Date   WBC 6.7 12/28/2022   HGB 11.4 (L) 12/28/2022   HCT 34.4 (L) 12/28/2022   MCV 103.3 (H) 12/28/2022   PLT 113 (L) 12/28/2022   Lab Results  Component Value Date   CRP <3.0 12/28/2022   Lab Results  Component Value Date   ESRSEDRATE 2 12/28/2022   Pertinent ROS were reviewed with the patient and found to be negative unless  otherwise specified above in HPI.   Assessment & Plan: Visit Diagnoses:  1. Chronic pain of left knee   2. Patellar tendinosis   3. Skin erythema   4. Ulcerated, foot, left, with fat layer exposed (HCC)    Plan: Donald Rios is dealing with symptomatic patellar tendinosis with concomitant patellar tendon calcification. This was likely present chronically but has been flared up with his other medical conditions including lower extremity pitting edema and an ulcerated lesion of the foot.  He has been on prednisone as well for his temporal arteritis.  I did review his MRI of the knee which does not show any evidence of infective bursitis, osteomyelitis or evidence of soft tissue infection.  It is also reassuring patient had blood work 3 days ago without any leukocytosis or any inflammatory markers suggestive of systemic infection.  I did ultrasound his tibial tubercle today which shows a calcification in the distal patella with some mild hyperemia, but no fluid collection indicative of infection or fluid pocket.  Discussed with Donald Rios that it is possible he did have some resolving soft tissue cellulitis that improved with his course of Bactrim for 7 days.  It has been about 10 days since he has been off of the antibiotic and he has not had any worsening of his redness or any signs of systemic infection.  I did recommend treating his patellar tendinosis with topical  Voltaren gel which he may apply 3 times daily, as well as icing the knee once nightly for the next few weeks. I would like to see him back over the next 2-3 weeks to ensure that his erythema has not increased or there is no development of a fluid pocket after a few more weeks of being off the antibiotics altogether.  He will continue his treatment with his primary care physician for his foot lesion.  At his follow-up, if he has no further signs concerning for cellulitis or infection, we could consider additional treatment for his patellar tendinosis with  extracorporeal shockwave therapy.  Follow-up in 3 weeks.  Follow-up: Return in about 3 weeks (around 01/21/2023) for for Left knee .   Meds & Orders: No orders of the defined types were placed in this encounter.   Orders Placed This Encounter  Procedures   [place] Extrem Low Left Ltd     Procedures: No procedures performed      Clinical History: No specialty comments available.  He reports that he quit smoking about 37 years ago. His smoking use included cigarettes. He has never used smokeless tobacco.  Recent Labs    05/14/22 0836 08/14/22 0317  HGBA1C 5.6 5.8*    Objective:   Vital Signs: BP 121/73 (BP Location: Left Arm, Patient Position: Sitting)   Pulse 96   Ht 5\' 8"  (1.727 m)   Wt 152 lb (68.9 kg)   BMI 23.11 kg/m   Physical Exam  Gen: Well-appearing, in no acute distress; non-toxic CV: Well-perfused. Warm.  Resp: Breathing unlabored on room air; no wheezing. Psych: Fluid speech in conversation; appropriate affect; normal thought process Neuro: Sensation intact throughout. No gross coordination deficits.   Ortho Exam - Left knee: Around the area of the tibial tubercle there is erythema that is approximately 5 cm x 4 cm in size.  There is no fluctuance surrounding this.  No warmth to the tissue.  Mildly tender with palpation and resisted knee extension.  There is preserved range of motion about the knee joint.  Ligamentously intact.  Imaging:  [place] Extrem Low Left Ltd Limited musculoskeletal ultrasound of the left knee was performed today.   X-rays show the suprapatellar knee joint without effusion.  No cortical  regularity of the patella.  The patellar tendon is intact but the distal  portion has evidence of tendinopathy with thickening.  Most notable there  is tendinosis with significant calcification in the distal patella at and  just proximal to the tibial tubercle.  There is mild hyperemia in this  location.  There is no prepatellar or infrapatellar bursitis.   Very mild  cobblestoning of the superficial soft tissue, indicative of likely  resolving cellulitis.  There is no fluid or fluid pockets around the soft  tissue on US examination.     MR TIBIA FIBULA LEFT WO CONTRAST CLINICAL DATA:  Redness about the anterior aspect of the left tibial tubercle for 5 weeks.  EXAM: MRI OF LOWER LEFT EXTREMITY WITHOUT CONTRAST  TECHNIQUE: Multiplanar, multisequence MR imaging of the left lower leg was performed. No intravenous contrast was administered.  COMPARISON:  None Available.  FINDINGS: Bones/Joint/Cartilage  There is no marrow edema to suggest osteomyelitis. No evidence of septic joint is seen. No fracture, stress change or focal lesion.  Ligaments  Intact.  Muscles and Tendons  Intact. Intrasubstance increased T2 signal is seen in the distal aspect the patellar tendon consistent with tendinosis.  Soft tissues  Mild subcutaneous edema is  seen anterior to the tibial tubercle. No focal fluid collection is identified.  IMPRESSION: 1. Mild subcutaneous edema anterior to the tibial tubercle could be due to cellulitis. No abscess or osteomyelitis is seen. 2. Distal patellar tendinosis.  Electronically Signed   By: Drusilla Kanner M.D.   On: 12/28/2022 08:48 MR FOOT LEFT WO CONTRAST CLINICAL DATA:  Open wound on the left foot.  EXAM: MRI OF THE LEFT FOOT WITHOUT CONTRAST  TECHNIQUE: Multiplanar, multisequence MR imaging of the left foot was performed. No intravenous contrast was administered.  COMPARISON:  None Available.  FINDINGS: Bones/Joint/Cartilage:  No marrow edema to suggest osteomyelitis is identified. No joint effusion. No fracture or dislocation. Scattered, mild appearing osteoarthritis is most notable at first MTP joint.  Ligaments  Intact.  Muscles and Tendons  Intact.  No intramuscular fluid collection.  Soft tissues  There is a focal appearing fluid collection in the dorsal subcutaneous  tissues of the foot measuring approximately 2.7 cm transverse by to 1 cm craniocaudal by 5 cm long. The collection is T2 hyperintense with T1 hyperintensity within as well. No other focal fluid collection is identified.  IMPRESSION: 1. Focal fluid collection in the dorsal subcutaneous tissues of the foot could be due to abscess hematoma. No evidence of osteomyelitis or septic arthritis. 2. Mild appearing osteoarthritis.  Electronically Signed   By: Drusilla Kanner M.D.   On: 12/28/2022 08:45  Past Medical/Family/Surgical/Social History: Medications & Allergies reviewed per EMR, new medications updated. 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 08/30/2017   Cardiomyopathy, ischemic 12/23/2015   Chronic systolic congestive heart failure, NYHA class 1 (HCC) 12/23/2015   Acute gastric ulcer with hemorrhage 05/06/2013   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    Mild tricuspid regurgitation    Mitral regurgitation    Atrial enlargement, left    Hearing loss    Diverticulosis    Hiatal hernia    Gout    DM2 (diabetes mellitus, type 2) (HCC)    GERD (gastroesophageal reflux disease)    Mixed hyperlipidemia    Essential hypertension    Past Medical History:  Diagnosis Date   Acute gastric ulcer with hemorrhage 05/06/2013   Arthritis    Asthma    Atrial enlargement, left    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    Cataract    COPD (chronic obstructive pulmonary disease) (HCC)    Diabetes mellitus    Diabetic nephropathy (HCC)    Diverticulosis    ED (erectile  dysfunction)    GERD (gastroesophageal reflux disease)    Gout    Hearing loss    Hiatal hernia 05/2008   EGD with HH and reflux esophagitis.    History of MI (myocardial infarction)    History of nuclear stress test 08/07/2010   dipyridamole; EKG negative for ischemia, low risk scan    Hyperlipidemia    Hyperplastic colon polyp 05/2008   Hypertension    Ischemic cardiomyopathy    EF 45%, with inferior wall motion abnormality    Myocardial infarction (HCC)    Valvular regurgitation    mitral and tricuspid (mild)  Family History  Problem Relation Age of Onset   Stroke Father    Hypertension Father    Diabetes Brother    Esophageal cancer Brother    Stroke Maternal Grandmother    Stroke Maternal Grandfather    Stomach cancer Maternal Aunt    Colon cancer Neg Hx    Rectal cancer Neg Hx    Past Surgical History:  Procedure Laterality Date   CARDIOVERSION N/A 08/16/2022   Procedure: CARDIOVERSION;  Surgeon: Jake Bathe, MD;  Location: [facility];  Service: Cardiovascular;  Laterality: N/A;   CORONARY ARTERY BYPASS GRAFT  2000   LIMA to LAD, free IMA to OM2, sequential graft to PLA & PLD   ESOPHAGOGASTRODUODENOSCOPY N/A 05/07/2013   Procedure: ESOPHAGOGASTRODUODENOSCOPY (EGD);  Surgeon: Iva Boop, MD;  Location: [facility] END[facility];  Service: Endoscopy;  Laterality: N/A;   TEE WITHOUT CARDIOVERSION N/A 08/16/2022   Procedure: TRANSESOPHAGEAL ECHOCARDIOGRAM;  Surgeon: Jake Bathe, MD;  Location: [facility];  Service: Cardiovascular;  Laterality: N/A;   TRANSTHORACIC ECHOCARDIOGRAM  09/07/2010   EF 45-50%; LV systolic function mildly reduced; LA mildly dilated; mild-mod MR & mild-mod TR; aortic root sclerosis/calcification;    Social History   Occupational History   Not on file  Tobacco Use   Smoking status: Former    Current packs/day: 0.00    Types: Cigarettes    Quit date: 11/04/1985    Years since quitting: 37.1   Smokeless tobacco: Never  Vaping Use    Vaping status: Never Used  Substance and Sexual Activity   Alcohol use: Not Currently    Alcohol/week: 2.0 - 3.0 standard drinks of alcohol    Types: 2 - 3 Cans of beer per week   Drug use: No   Sexual activity: Not on file

## 2022-12-31 NOTE — Progress Notes (Signed)
Patient says that it has been a couple of weeks since he finished his second round of antibiotics. He says the knee is still a bit tender but not nearly as swollen and tender as it was.

## 2023-01-01 LAB — BASIC METABOLIC PANEL WITH GFR
BUN: 16 mg/dL (ref 7–25)
CO2: 26 mmol/L (ref 20–32)
Calcium: 8.7 mg/dL (ref 8.6–10.3)
Chloride: 110 mmol/L (ref 98–110)
Creat: 1.11 mg/dL (ref 0.70–1.22)
Glucose, Bld: 193 mg/dL — ABNORMAL HIGH (ref 65–99)
Potassium: 5.1 mmol/L (ref 3.5–5.3)
Sodium: 142 mmol/L (ref 135–146)
eGFR: 67 mL/min/{1.73_m2} (ref >=60)

## 2023-01-04 ENCOUNTER — Telehealth: Payer: Self-pay

## 2023-01-04 NOTE — Telephone Encounter (Signed)
Copied from CRM [id number]. Topic: Clinical - Medical Advice >> Jan 04, 2023 10:09 AM Elle L wrote: Reason for CRM: The patient is having pain from a wound in his left foot and wants to see if a medication can be called in for him. His number is [phone] and his preferred pharmacy is [facility] [id number] [place], [place] - [address] [facility] [id number] - [place], [place] - [address]

## 2023-01-06 ENCOUNTER — Other Ambulatory Visit: Payer: Self-pay | Admitting: Family Medicine

## 2023-01-06 MED ORDER — TRAMADOL HCL 50 MG PO TABS: 50.0000 mg | ORAL_TABLET | Freq: Three times a day (TID) | ORAL | 0 refills | Status: AC | PRN

## 2023-01-07 ENCOUNTER — Ambulatory Visit (INDEPENDENT_AMBULATORY_CARE_PROVIDER_SITE_OTHER): Payer: Medicare Other | Admitting: Family Medicine

## 2023-01-07 ENCOUNTER — Encounter: Payer: Self-pay | Admitting: Family Medicine

## 2023-01-07 VITALS — BP 120/70 | HR 89 | Ht 68.0 in | Wt 150.2 lb

## 2023-01-07 DIAGNOSIS — L03116 Cellulitis of left lower limb: Secondary | ICD-10-CM | POA: Diagnosis not present

## 2023-01-07 DIAGNOSIS — L97522 Non-pressure chronic ulcer of other part of left foot with fat layer exposed: Secondary | ICD-10-CM

## 2023-01-07 MED ORDER — CEPHALEXIN 500 MG PO CAPS: 500.0000 mg | ORAL_CAPSULE | Freq: Three times a day (TID) | ORAL | 0 refills | Status: DC

## 2023-01-07 NOTE — Progress Notes (Signed)
Subjective:    Patient ID: Donald Rios, male    DOB: 1940-04-17, 82 y.o.   MRN: [id number] 12/24/22 Patient is here today for recheck.  His creatinine was elevated to 1.9 so I recommended holding the Lasix.  He has not done that yet.  The appointment has been scheduled for the MRI of his foot and leg her next Tuesday.  He continues to complain of pain over the left anterior tibial tubercle.  The erythema has faded he is still tender to palpation and there subcutaneous edema in that area.  The wound on his foot shows healthy red granulation tissue at the base.  The skin edges are not undermined.  The wound itself is 3 cm x 2-1/2 cm.  There is no evidence of any secondary cellulitis.  12/31/22 Since I last saw the patient, he had an MRI of the left tibia that showed no evidence of osteomyelitis over the anterior tibial tubercle.  Therefore I believe the patient may have some insertional tendinitis in his patellar tendon.  However there is no evidence of cellulitis or osteomyelitis.  He also had an MRI of his left foot.  This showed no evidence of osteomyelitis in the foot.  I believe that the edema that they noticed on the MRI was likely the wet-to-dry dressing changes but the patient is performing.  The wound is minimally smaller today.  Today on exam it is 3x2.1 cm.  The wound has healthy granulation tissue all throughout.  There is no drainage.  There is no pain.  There is no evidence of secondary cellulitis.  There is no foul odor.  Patient is also due to recheck a BMP to monitor his renal function.  He continues to hold Lasix.  He continues to manage his peripheral edema with compression hose. 01/07/23  Recently, the patient states that he was having more pain with dressing changes.  He has been performing wet-to-dry dressing changes every day.  The wound has shrunk in size.  It is now 2 cm x 2.5 cm.  However the skin on the dorsum of the foot, over the lateral aspect of the ankle, has become  erythematous and warm and tender to the touch.  I believe the patient is developing secondary cellulitis.  He has been taking tramadol for the pain. 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    History of MI (myocardial infarction)    History of nuclear stress test 08/07/2010   dipyridamole; EKG negative for ischemia, low risk scan    Hyperlipidemia    Hyperplastic colon polyp 05/2008   Hypertension    Ischemic cardiomyopathy    EF 45%, with inferior wall motion abnormality    Myocardial infarction (HCC)    Valvular regurgitation    mitral and tricuspid (mild)   Past Surgical History:  Procedure Laterality Date   CARDIOVERSION N/A 08/16/2022   Procedure: CARDIOVERSION;  Surgeon: Jake Bathe, MD;  Location: [facility];  Service: Cardiovascular;  Laterality: N/A;   CORONARY ARTERY BYPASS GRAFT  2000   LIMA to LAD, free IMA to OM2, sequential graft to PLA & PLD   ESOPHAGOGASTRODUODENOSCOPY N/A 05/07/2013   Procedure: ESOPHAGOGASTRODUODENOSCOPY (EGD);  Surgeon: Iva Boop, MD;  Location: [facility] ENDOSCOPY;  Service: Endoscopy;  Laterality: N/A;   TEE WITHOUT CARDIOVERSION N/A 08/16/2022   Procedure: TRANSESOPHAGEAL ECHOCARDIOGRAM;  Surgeon: Jake Bathe, MD;  Location: [facility];  Service: Cardiovascular;  Laterality: N/A;   TRANSTHORACIC ECHOCARDIOGRAM  09/07/2010   EF 45-50%; LV systolic function mildly reduced; LA mildly dilated; mild-mod MR & mild-mod TR; aortic root sclerosis/calcification;    Current Outpatient Medications on File  Prior to Visit  Medication Sig Dispense Refill   allopurinol (ZYLOPRIM) 300 MG tablet TAKE 1 TABLET BY MOUTH DAILY 90 tablet 0   apixaban (ELIQUIS) 5 MG TABS tablet Take 1 tablet (5 mg total) by mouth 2 (two) times daily. 60 tablet 5   Blood Glucose Monitoring Suppl (BLOOD GLUCOSE MONITOR SYSTEM) w/Device KIT 1 each by Does not apply route in the morning, at noon, and at bedtime. 1 kit 0   cephALEXin (KEFLEX) 500 MG capsule Take 1 capsule (500 mg total) by mouth 3 (three) times daily. 21 capsule 0   cephALEXin (KEFLEX) 500 MG capsule Take 1 capsule (500 mg total) by mouth 3 (three) times daily. 21 capsule 0   doxazosin (CARDURA) 2 MG tablet TAKE 1 TABLET BY MOUTH DAILY (Patient taking differently: Take 2 mg by mouth at bedtime.) 90 tablet 3   empagliflozin (JARDIANCE) 10 MG TABS tablet Take 1 tablet (10 mg total) by mouth daily. 30 tablet 2   furosemide (LASIX) 40 MG tablet Take 1 tablet (40 mg total) by mouth 2 (two) times daily. 180 tablet 3   glipiZIDE (GLIPIZIDE XL) 5 MG 24 hr tablet Take 1 tablet (5 mg total) by mouth daily with breakfast. 30 tablet 3   Glucose Blood (BLOOD GLUCOSE TEST STRIPS) STRP Use in the morning, at noon, and at bedtime. May substitute to any manufacturer covered by patient's insurance. 100 strip 2   HYDROcodone-acetaminophen (NORCO) 5-325 MG tablet Take 1 tablet by mouth every 6 (six) hours as needed for moderate pain (pain score 4-6). 30 tablet 0   insulin aspart (FIASP FLEXTOUCH) 100 UNIT/ML FlexTouch Pen Before each meal 3 times a day, 140-199 - 2 units, 200-250 - 4 units, 251-299 - 6 units,  300-349 - 8 units,  350 or above 10 units. (Patient taking differently: Before each meal 3 times a day, 140-199 - 2 units, 200-250 - 4 units, 251-299 - 6 units,  300-349 - 8 units,  350 or above 10 units.) 9 mL 0   Insulin Pen Needle 32G X 4 MM MISC For 4 times a day insulin injection under the skin, 1 month supply. 100 each 0   losartan (COZAAR) 25 MG tablet Take 1 tablet (25 mg  total) by mouth daily. 90 tablet 1   metoprolol tartrate (LOPRESSOR) 50 MG tablet Take 2 tablets (100 mg total) by mouth 2 (two) times daily. 360 tablet 3   mirtazapine (REMERON) 30 MG tablet TAKE 1 TABLET BY MOUTH EVERY NIGHT AT BEDTIME FOR APPETITE 90 tablet 1   mupirocin ointment (BACTROBAN) 2 % SMARTSIG:1 Application Topical 2-3 Times Daily     pantoprazole (PROTONIX) 40 MG tablet Take 1 tablet (40 mg total) by mouth daily. 30 tablet 3   predniSONE (DELTASONE) 5  MG tablet Take 3 tablets (15 mg total) by mouth daily with breakfast for 14 days, THEN 2.5 tablets (12.5 mg total) daily with breakfast for 14 days, THEN 2 tablets (10 mg total) daily with breakfast for 14 days. 105 tablet 0   simvastatin (ZOCOR) 40 MG tablet TAKE ONE TABLET BY MOUTH DAILY (Patient taking differently: Take 40 mg by mouth at bedtime.) 90 tablet 3   spironolactone (ALDACTONE) 25 MG tablet Take 1 tablet (25 mg total) by mouth 2 (two) times daily. 180 tablet 3   sulfamethoxazole-trimethoprim (BACTRIM DS) 800-160 MG tablet Take 1 tablet by mouth 2 (two) times daily. 14 tablet 0   Tocilizumab (ACTEMRA IV) Inject 6 mg/kg into the vein every 28 (twenty-eight) days. Receives at [employer]     traMADol (ULTRAM) 50 MG tablet Take 1 tablet (50 mg total) by mouth every 8 (eight) hours as needed for up to 5 days. 15 tablet 0   potassium chloride SA (KLOR-CON M) 20 MEQ tablet Take 1 tablet (20 mEq total) by mouth once for 1 dose. 7 tablet 0   No current facility-administered medications on file prior to visit.     No Known Allergies Social History   Socioeconomic History   Marital status: Married    Spouse name: Not on file   Number of children: 1   Years of education: Not on file   Highest education level: Not on file  Occupational History   Not on file  Tobacco Use   Smoking status: Former    Current packs/day: 0.00    Types: Cigarettes    Quit date: 11/04/1985    Years since quitting: 37.2    Smokeless tobacco: Never  Vaping Use   Vaping status: Never Used  Substance and Sexual Activity   Alcohol use: Not Currently    Alcohol/week: 2.0 - 3.0 standard drinks of alcohol    Types: 2 - 3 Cans of beer per week   Drug use: No   Sexual activity: Not on file  Other Topics Concern   Not on file  Social History Narrative   Not on file   Social Drivers of Health   Financial Resource Strain: Low Risk  (08/24/2022)   Overall Financial Resource Strain (CARDIA)    Difficulty of Paying Living Expenses: Not hard at all  Food Insecurity: No Food Insecurity (08/24/2022)   Hunger Vital Sign    Worried About Running Out of Food in the Last Year: Never true    Ran Out of Food in the Last Year: Never true  Transportation Needs: No Transportation Needs (08/24/2022)   PRAPARE - Administrator, Civil Service (Medical): No    Lack of Transportation (Non-Medical): No  Physical Activity: Sufficiently Active (07/10/2021)   Exercise Vital Sign    Days of Exercise per Week: 5 days    Minutes of Exercise per Session: 30 min  Stress: No Stress Concern Present (08/24/2022)   [employer] of Occupational Health - Occupational Stress Questionnaire    Feeling of Stress : Only a little  Social Connections: Socially Integrated (07/22/2022)   Social Connection and Isolation Panel [NHANES]    Frequency of Communication with Friends and Family: More than three times a week    Frequency of Social Gatherings with Friends and Family: More than three times a week    Attends Religious Services: More than 4 times per year    Active Member of Clubs or Organizations: Yes  Attends Banker Meetings: More than 4 times per year    Marital Status: Married  Catering manager Violence: Not At Risk (08/13/2022)   Humiliation, Afraid, Rape, and Kick questionnaire    Fear of Current or Ex-Partner: No    Emotionally Abused: No    Physically Abused: No    Sexually Abused: No     Review of  Systems  All other systems reviewed and are negative.      Objective:   Physical Exam Vitals reviewed.  Constitutional:      Appearance: He is well-developed.  Neck:     Thyroid: No thyromegaly.     Vascular: No JVD.  Cardiovascular:     Rate and Rhythm: Normal rate. Rhythm irregular.     Pulses:          Dorsalis pedis pulses are 1+ on the left side.       Posterior tibial pulses are 1+ on the left side.     Heart sounds: Normal heart sounds. No murmur heard. Pulmonary:     Effort: Pulmonary effort is normal. No respiratory distress.     Breath sounds: Normal breath sounds. No wheezing or rales.  Chest:     Chest wall: No tenderness.  Abdominal:     General: Bowel sounds are normal. There is no distension.     Palpations: Abdomen is soft. There is no mass.     Tenderness: There is no abdominal tenderness. There is no guarding or rebound.  Musculoskeletal:     Cervical back: Neck supple.     Right lower leg: No edema.     Left lower leg: No edema.       Feet:  Feet:     Left foot:     Skin integrity: Ulcer and skin breakdown present. No erythema or warmth.  Lymphadenopathy:     Cervical: No cervical adenopathy.  Skin:    Findings: Erythema and lesion present.           Assessment & Plan:  Ulcerated, foot, left, with fat layer exposed (HCC)  Cellulitis of left lower extremity Wound continues to slowly heal.  Treat cellulitis with Keflex 500 mg p.o. 3 times daily for 7 days.  Recheck next week if no better or sooner if worse.  Discontinue wet-to-dry dressing changes.  Begin hydrocolloid dressing changes.  Recommended that they fill the wound bed with Polysporin and then cover with a hydrocolloid dressing.  Change this every 48 hours.  Recheck in 1 to 2 weeks depending upon his response to antibiotics.

## 2023-01-12 DIAGNOSIS — I081 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: Secondary | ICD-10-CM | POA: Insufficient documentation

## 2023-01-12 DIAGNOSIS — I13 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, or unspecified chronic kidney disease: Secondary | ICD-10-CM | POA: Insufficient documentation

## 2023-01-12 DIAGNOSIS — K5790 Diverticulosis of intestine, part unspecified, without perforation or abscess without bleeding: Secondary | ICD-10-CM | POA: Insufficient documentation

## 2023-01-12 DIAGNOSIS — E1122 Type 2 diabetes mellitus with diabetic chronic kidney disease: Secondary | ICD-10-CM | POA: Insufficient documentation

## 2023-01-12 DIAGNOSIS — Z951 Presence of aortocoronary bypass graft: Secondary | ICD-10-CM | POA: Insufficient documentation

## 2023-01-12 DIAGNOSIS — M1990 Unspecified osteoarthritis, unspecified site: Secondary | ICD-10-CM | POA: Insufficient documentation

## 2023-01-12 DIAGNOSIS — Z7901 Long term (current) use of anticoagulants: Secondary | ICD-10-CM | POA: Insufficient documentation

## 2023-01-12 DIAGNOSIS — J4489 Other specified chronic obstructive pulmonary disease: Secondary | ICD-10-CM | POA: Insufficient documentation

## 2023-01-12 DIAGNOSIS — I959 Hypotension, unspecified: Secondary | ICD-10-CM | POA: Insufficient documentation

## 2023-01-12 DIAGNOSIS — Z9181 History of falling: Secondary | ICD-10-CM | POA: Insufficient documentation

## 2023-01-12 DIAGNOSIS — I2510 Atherosclerotic heart disease of native coronary artery without angina pectoris: Secondary | ICD-10-CM | POA: Insufficient documentation

## 2023-01-12 DIAGNOSIS — H5462 Unqualified visual loss, left eye, normal vision right eye: Secondary | ICD-10-CM | POA: Insufficient documentation

## 2023-01-12 DIAGNOSIS — I252 Old myocardial infarction: Secondary | ICD-10-CM | POA: Insufficient documentation

## 2023-01-12 DIAGNOSIS — J449 Chronic obstructive pulmonary disease, unspecified: Secondary | ICD-10-CM | POA: Insufficient documentation

## 2023-01-12 DIAGNOSIS — Z7984 Long term (current) use of oral hypoglycemic drugs: Secondary | ICD-10-CM | POA: Insufficient documentation

## 2023-01-12 DIAGNOSIS — E785 Hyperlipidemia, unspecified: Secondary | ICD-10-CM | POA: Insufficient documentation

## 2023-01-12 DIAGNOSIS — K2100 Gastro-esophageal reflux disease with esophagitis, without bleeding: Secondary | ICD-10-CM | POA: Insufficient documentation

## 2023-01-12 DIAGNOSIS — K432 Incisional hernia without obstruction or gangrene: Secondary | ICD-10-CM | POA: Insufficient documentation

## 2023-01-12 DIAGNOSIS — H9190 Unspecified hearing loss, unspecified ear: Secondary | ICD-10-CM | POA: Insufficient documentation

## 2023-01-12 DIAGNOSIS — Z87891 Personal history of nicotine dependence: Secondary | ICD-10-CM | POA: Insufficient documentation

## 2023-01-12 DIAGNOSIS — M109 Gout, unspecified: Secondary | ICD-10-CM | POA: Insufficient documentation

## 2023-01-13 ENCOUNTER — Encounter: Payer: Self-pay | Admitting: Internal Medicine

## 2023-01-13 ENCOUNTER — Ambulatory Visit: Payer: Medicare Other | Attending: Internal Medicine | Admitting: Internal Medicine

## 2023-01-13 VITALS — BP 116/68 | HR 93 | Ht 68.0 in | Wt 146.7 lb

## 2023-01-13 DIAGNOSIS — I4819 Other persistent atrial fibrillation: Secondary | ICD-10-CM | POA: Diagnosis not present

## 2023-01-13 DIAGNOSIS — I2510 Atherosclerotic heart disease of native coronary artery without angina pectoris: Secondary | ICD-10-CM | POA: Insufficient documentation

## 2023-01-13 DIAGNOSIS — Z7901 Long term (current) use of anticoagulants: Secondary | ICD-10-CM | POA: Diagnosis not present

## 2023-01-13 DIAGNOSIS — Z951 Presence of aortocoronary bypass graft: Secondary | ICD-10-CM | POA: Insufficient documentation

## 2023-01-13 DIAGNOSIS — I5022 Chronic systolic (congestive) heart failure: Secondary | ICD-10-CM | POA: Diagnosis not present

## 2023-01-13 MED ORDER — AMIODARONE HCL 200 MG PO TABS: ORAL_TABLET | ORAL | 1 refills | Status: DC

## 2023-01-13 NOTE — Patient Instructions (Addendum)
 Medication Instructions:  START amiodarone  200mg  twice daily -- take for 2 weeks ------ THEN decrease amiodarone  to 200mg  once daily  *If you need a refill on your cardiac medications before your next appointment, please call your pharmacy*   Follow-Up: At [facility], you and your health needs are our priority.  As part of our continuing mission to provide you with exceptional heart care, we have created designated Provider Care Teams.  These Care Teams include your primary Cardiologist (physician) and Advanced Practice Providers (APPs -  Physician Assistants and Nurse Practitioners) who all work together to provide you with the care you need, when you need it.  We recommend signing up for the patient portal called MyChart.  Sign up information is provided on this After Visit Summary.  MyChart is used to connect with patients for Virtual Visits (Telemedicine).  Patients are able to view lab/test results, encounter notes, upcoming appointments, etc.  Non-urgent messages can be sent to your provider as well.   To learn more about what you can do with MyChart, go to [email or web address].    Your next appointment:    4 weeks with Dr. Mona or Hao Meng PA
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
## 2023-01-17 ENCOUNTER — Ambulatory Visit: Payer: Medicare Other | Admitting: Family Medicine

## 2023-01-17 ENCOUNTER — Encounter: Payer: Self-pay | Admitting: Family Medicine

## 2023-01-17 VITALS — BP 122/72 | HR 103 | Temp 98.2°F | Ht 68.0 in | Wt 147.1 lb

## 2023-01-17 DIAGNOSIS — L97522 Non-pressure chronic ulcer of other part of left foot with fat layer exposed: Secondary | ICD-10-CM

## 2023-01-17 DIAGNOSIS — L03116 Cellulitis of left lower limb: Secondary | ICD-10-CM

## 2023-01-17 MED ORDER — SULFAMETHOXAZOLE-TRIMETHOPRIM 800-160 MG PO TABS: 1.0000 | ORAL_TABLET | Freq: Two times a day (BID) | ORAL | 0 refills | Status: DC

## 2023-01-17 NOTE — Progress Notes (Signed)
 Subjective:    Patient ID: Donald Rios, male    DOB: 07/21/1940, 83 y.o.   MRN: [id number] 12/24/22 Patient is here today for recheck.  His creatinine was elevated to 1.9 so I recommended holding the Lasix .  He has not done that yet.  The appointment has been scheduled for the MRI of his foot and leg her next Tuesday.  He continues to complain of pain over the left anterior tibial tubercle.  The erythema has faded he is still tender to palpation and there subcutaneous edema in that area.  The wound on his foot shows healthy red granulation tissue at the base.  The skin edges are not undermined.  The wound itself is 3 cm x 2-1/2 cm.  There is no evidence of any secondary cellulitis.  12/31/22 Since I last saw the patient, he had an MRI of the left tibia that showed no evidence of osteomyelitis over the anterior tibial tubercle.  Therefore I believe the patient may have some insertional tendinitis in his patellar tendon.  However there is no evidence of cellulitis or osteomyelitis.  He also had an MRI of his left foot.  This showed no evidence of osteomyelitis in the foot.  I believe that the edema that they noticed on the MRI was likely the wet-to-dry dressing changes but the patient is performing.  The wound is minimally smaller today.  Today on exam it is 3x2.1 cm.  The wound has healthy granulation tissue all throughout.  There is no drainage.  There is no pain.  There is no evidence of secondary cellulitis.  There is no foul odor.  Patient is also due to recheck a BMP to monitor his renal function.  He continues to hold Lasix .  He continues to manage his peripheral edema with compression hose. 01/07/23  Recently, the patient states that he was having more pain with dressing changes.  He has been performing wet-to-dry dressing changes every day.  The wound has shrunk in size.  It is now 2 cm x 2.5 cm.  However the skin on the dorsum of the foot, over the lateral aspect of the ankle, has become  erythematous and warm and tender to the touch.  I believe the patient is developing secondary cellulitis.  He has been taking tramadol  for the pain.  At that time, my plan was: Wound continues to slowly heal.  Treat cellulitis with Keflex  500 mg p.o. 3 times daily for 7 days.  Recheck next week if no better or sooner if worse.  Discontinue wet-to-dry dressing changes.  Begin hydrocolloid dressing changes.  Recommended that they fill the wound bed with Polysporin and then cover with a hydrocolloid dressing.  Change this every 48 hours.  Recheck in 1 to 2 weeks depending upon his response to antibiotics  01/16/22  Although the wound appears to be slowly shrinking in size as shown in the photograph above, there is yellow exudate filling the wound bed with a foul odor.  The skin is erythematous warm and painful.  Cellulitis has not responded to Keflex  raising the concern over MRSA. 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    History of MI (myocardial infarction)    History of nuclear stress test 08/07/2010   dipyridamole; EKG negative for ischemia, low risk scan    Hyperlipidemia    Hyperplastic colon polyp 05/2008   Hypertension    Ischemic cardiomyopathy    EF 45%, with inferior wall motion abnormality    Myocardial infarction (HCC)    Valvular regurgitation    mitral and tricuspid (mild)   Past Surgical History:  Procedure Laterality Date   CARDIOVERSION N/A 08/16/2022   Procedure: CARDIOVERSION;  Surgeon: Jeffrie Oneil BROCKS, MD;  Location: [facility];   Service: Cardiovascular;  Laterality: N/A;   CORONARY ARTERY BYPASS GRAFT  2000   LIMA to LAD, free IMA to OM2, sequential graft to PLA & PLD   ESOPHAGOGASTRODUODENOSCOPY N/A 05/07/2013   Procedure: ESOPHAGOGASTRODUODENOSCOPY (EGD);  Surgeon: Lupita FORBES Commander, MD;  Location[facility];  Service: Endoscopy;  Laterality: N/A;   TEE WITHOUT CARDIOVERSION N/A 08/16/2022   Procedure: TRANSESOPHAGEAL ECHOCARDIOGRAM;  Surgeon: Jeffrie Oneil BROCKS, MD;  Location: [facility];  Service: Cardiovascular;  Laterality: N/A;   TRANSTHORACIC ECHOCARDIOGRAM  09/07/2010   EF 45-50%; LV systolic function mildly reduced; LA mildly dilated; mild-mod MR & mild-mod TR; aortic root sclerosis/calcification;    Current Outpatient Medications on File Prior to Visit  Medication Sig Dispense Refill   allopurinol  (ZYLOPRIM ) 300 MG tablet TAKE 1 TABLET BY MOUTH DAILY 90 tablet 0   amiodarone  (PACERONE ) 200 MG tablet Take 1 tablet by mouth twice daily for 14 days then decrease to 1 tablet once daily. 115 tablet 1   apixaban  (ELIQUIS ) 5 MG TABS tablet Take 1 tablet (5 mg total) by mouth 2 (two) times daily. 60 tablet 5   Blood Glucose Monitoring Suppl (BLOOD GLUCOSE MONITOR SYSTEM) w/Device KIT 1 each by Does not apply route in the morning, at noon, and at bedtime. 1 kit 0   cephALEXin  (KEFLEX ) 500 MG capsule Take 1 capsule (500 mg total) by mouth 3 (three) times daily. 21 capsule 0   doxazosin  (CARDURA ) 2 MG tablet TAKE 1 TABLET BY MOUTH DAILY (Patient taking differently: Take 2 mg by mouth at bedtime.) 90 tablet 3   empagliflozin  (JARDIANCE ) 10 MG TABS tablet Take 1 tablet (10 mg total) by mouth daily. 30 tablet 2   furosemide  (LASIX ) 40 MG tablet Take 1 tablet (40 mg total) by mouth 2 (two) times daily. 180 tablet 3   glipiZIDE  (GLIPIZIDE  XL) 5 MG 24 hr tablet Take 1 tablet (5 mg total) by mouth daily with breakfast. 30 tablet 3   Glucose Blood (BLOOD GLUCOSE TEST STRIPS) STRP Use in the morning, at noon, and at bedtime. May  substitute to any manufacturer covered by patient's insurance. 100 strip 2   HYDROcodone -acetaminophen  (NORCO) 5-325 MG tablet Take 1 tablet by mouth every 6 (six) hours as needed for moderate pain (pain score 4-6). 30 tablet 0   insulin  aspart (FIASP  FLEXTOUCH) 100 UNIT/ML FlexTouch Pen Before each meal 3 times a day, 140-199 - 2 units, 200-250 - 4 units, 251-299 - 6 units,  300-349 - 8 units,  350 or above 10 units. (Patient taking differently: as needed. Before each meal 3 times a day, 140-199 - 2 units, 200-250 - 4 units, 251-299 - 6 units,  300-349 -  8 units,  350 or above 10 units.) 9 mL 0   Insulin  Pen Needle 32G X 4 MM MISC For 4 times a day insulin  injection under the skin, 1 month supply. 100 each 0   losartan  (COZAAR ) 25 MG tablet Take 1 tablet (25 mg total) by mouth daily. 90 tablet 1   metoprolol  tartrate (LOPRESSOR ) 50 MG tablet Take 2 tablets (100 mg total) by mouth 2 (two) times daily. 360 tablet 3   mirtazapine  (REMERON ) 30 MG tablet TAKE 1 TABLET BY MOUTH EVERY NIGHT AT BEDTIME FOR APPETITE 90 tablet 1   mupirocin ointment (BACTROBAN) 2 % SMARTSIG:1 Application Topical 2-3 Times Daily     pantoprazole  (PROTONIX ) 40 MG tablet Take 1 tablet (40 mg total) by mouth daily. 30 tablet 3   predniSONE  (DELTASONE ) 5 MG tablet Take 3 tablets (15 mg total) by mouth daily with breakfast for 14 days, THEN 2.5 tablets (12.5 mg total) daily with breakfast for 14 days, THEN 2 tablets (10 mg total) daily with breakfast for 14 days. 105 tablet 0   simvastatin  (ZOCOR ) 40 MG tablet TAKE ONE TABLET BY MOUTH DAILY (Patient taking differently: Take 40 mg by mouth at bedtime.) 90 tablet 3   spironolactone  (ALDACTONE ) 25 MG tablet Take 1 tablet (25 mg total) by mouth 2 (two) times daily. 180 tablet 3   Tocilizumab  (ACTEMRA  IV) Inject 6 mg/kg into the vein every 28 (twenty-eight) days. Receives at [employer]     cephALEXin  (KEFLEX ) 500 MG capsule Take 1 capsule (500 mg total) by mouth 3 (three)  times daily. (Patient not taking: Reported on 01/17/2023) 21 capsule 0   cephALEXin  (KEFLEX ) 500 MG capsule Take 1 capsule (500 mg total) by mouth 3 (three) times daily. (Patient not taking: Reported on 01/17/2023) 21 capsule 0   potassium chloride  SA (KLOR-CON  M) 20 MEQ tablet Take 1 tablet (20 mEq total) by mouth once for 1 dose. 7 tablet 0   sulfamethoxazole -trimethoprim  (BACTRIM  DS) 800-160 MG tablet Take 1 tablet by mouth 2 (two) times daily. (Patient not taking: Reported on 01/17/2023) 14 tablet 0   No current facility-administered medications on file prior to visit.     No Known Allergies Social History   Socioeconomic History   Marital status: Married    Spouse name: Not on file   Number of children: 1   Years of education: Not on file   Highest education level: Not on file  Occupational History   Not on file  Tobacco Use   Smoking status: Former    Current packs/day: 0.00    Types: Cigarettes    Quit date: 11/04/1985    Years since quitting: 37.2   Smokeless tobacco: Never  Vaping Use   Vaping status: Never Used  Substance and Sexual Activity   Alcohol  use: Not Currently    Alcohol /week: 2.0 - 3.0 standard drinks of alcohol     Types: 2 - 3 Cans of beer per week   Drug use: No   Sexual activity: Not on file  Other Topics Concern   Not on file  Social History Narrative   Not on file   Social Drivers of Health   Financial Resource Strain: Low Risk  (08/24/2022)   Overall Financial Resource Strain (CARDIA)    Difficulty of Paying Living Expenses: Not hard at all  Food Insecurity: No Food Insecurity (08/24/2022)   Hunger Vital Sign    Worried About Running Out of Food in the Last Year: Never true  Ran Out of Food in the Last Year: Never true  Transportation Needs: No Transportation Needs (08/24/2022)   PRAPARE - Administrator, Civil Service (Medical): No    Lack of Transportation (Non-Medical): No  Physical Activity: Sufficiently Active (07/10/2021)    Exercise Vital Sign    Days of Exercise per Week: 5 days    Minutes of Exercise per Session: 30 min  Stress: No Stress Concern Present (08/24/2022)   [employer] of Occupational Health - Occupational Stress Questionnaire    Feeling of Stress : Only a little  Social Connections: Socially Integrated (07/22/2022)   Social Connection and Isolation Panel [NHANES]    Frequency of Communication with Friends and Family: More than three times a week    Frequency of Social Gatherings with Friends and Family: More than three times a week    Attends Religious Services: More than 4 times per year    Active Member of [employer] or Organizations: Yes    Attends Banker Meetings: More than 4 times per year    Marital Status: Married  Catering Manager Violence: Not At Risk (08/13/2022)   Humiliation, Afraid, Rape, and Kick questionnaire    Fear of Current or Ex-Partner: No    Emotionally Abused: No    Physically Abused: No    Sexually Abused: No     Review of Systems  All other systems reviewed and are negative.      Objective:   Physical Exam Vitals reviewed.  Constitutional:      Appearance: He is well-developed.  Neck:     Thyroid : No thyromegaly.     Vascular: No JVD.  Cardiovascular:     Rate and Rhythm: Normal rate. Rhythm irregular.     Pulses:          Dorsalis pedis pulses are 1+ on the left side.       Posterior tibial pulses are 1+ on the left side.     Heart sounds: Normal heart sounds. No murmur heard. Pulmonary:     Effort: Pulmonary effort is normal. No respiratory distress.     Breath sounds: Normal breath sounds. No wheezing or rales.  Chest:     Chest wall: No tenderness.  Abdominal:     General: Bowel sounds are normal. There is no distension.     Palpations: Abdomen is soft. There is no mass.     Tenderness: There is no abdominal tenderness. There is no guarding or rebound.  Musculoskeletal:     Cervical back: Neck supple.     Right lower leg: No  edema.     Left lower leg: No edema.       Feet:  Feet:     Left foot:     Skin integrity: Ulcer and skin breakdown present. No erythema or warmth.  Lymphadenopathy:     Cervical: No cervical adenopathy.  Skin:    Findings: Erythema and lesion present.           Assessment & Plan:  Ulcerated, foot, left, with fat layer exposed (HCC)  Cellulitis of left lower extremity Switch patient to Bactrim  double tab twice daily to cover MRSA given the lack of response to Keflex .  Also, resume wet-to-dry dressing changes daily as long as the wound is producing purulent exudate.  When hydrocolloid dressing changes once the cellulitis and exudate have improved.  Recheck later this week or sooner if worsening.

## 2023-01-21 ENCOUNTER — Ambulatory Visit: Payer: Medicare Other | Admitting: Sports Medicine

## 2023-01-21 ENCOUNTER — Ambulatory Visit (INDEPENDENT_AMBULATORY_CARE_PROVIDER_SITE_OTHER): Payer: Medicare Other | Admitting: Family Medicine

## 2023-01-21 ENCOUNTER — Ambulatory Visit: Payer: Medicare Other | Admitting: Family Medicine

## 2023-01-21 VITALS — BP 108/52 | HR 86 | Temp 97.8°F | Ht 68.0 in | Wt 145.6 lb

## 2023-01-21 DIAGNOSIS — L03116 Cellulitis of left lower limb: Secondary | ICD-10-CM | POA: Diagnosis not present

## 2023-01-21 DIAGNOSIS — L97522 Non-pressure chronic ulcer of other part of left foot with fat layer exposed: Secondary | ICD-10-CM

## 2023-01-21 NOTE — Progress Notes (Signed)
 Subjective:    Patient ID: Donald Rios, male    DOB: 1940-06-25, 83 y.o.   MRN: [id number] 12/24/22 Patient is here today for recheck.  His creatinine was elevated to 1.9 so I recommended holding the Lasix .  He has not done that yet.  The appointment has been scheduled for the MRI of his foot and leg her next Tuesday.  He continues to complain of pain over the left anterior tibial tubercle.  The erythema has faded he is still tender to palpation and there subcutaneous edema in that area.  The wound on his foot shows healthy red granulation tissue at the base.  The skin edges are not undermined.  The wound itself is 3 cm x 2-1/2 cm.  There is no evidence of any secondary cellulitis.  12/31/22 Since I last saw the patient, he had an MRI of the left tibia that showed no evidence of osteomyelitis over the anterior tibial tubercle.  Therefore I believe the patient may have some insertional tendinitis in his patellar tendon.  However there is no evidence of cellulitis or osteomyelitis.  He also had an MRI of his left foot.  This showed no evidence of osteomyelitis in the foot.  I believe that the edema that they noticed on the MRI was likely the wet-to-dry dressing changes but the patient is performing.  The wound is minimally smaller today.  Today on exam it is 3x2.1 cm.  The wound has healthy granulation tissue all throughout.  There is no drainage.  There is no pain.  There is no evidence of secondary cellulitis.  There is no foul odor.  Patient is also due to recheck a BMP to monitor his renal function.  He continues to hold Lasix .  He continues to manage his peripheral edema with compression hose. 01/07/23  Recently, the patient states that he was having more pain with dressing changes.  He has been performing wet-to-dry dressing changes every day.  The wound has shrunk in size.  It is now 2 cm x 2.5 cm.  However the skin on the dorsum of the foot, over the lateral aspect of the ankle, has become  erythematous and warm and tender to the touch.  I believe the patient is developing secondary cellulitis.  He has been taking tramadol  for the pain.  At that time, my plan was: Wound continues to slowly heal.  Treat cellulitis with Keflex  500 mg p.o. 3 times daily for 7 days.  Recheck next week if no better or sooner if worse.  Discontinue wet-to-dry dressing changes.  Begin hydrocolloid dressing changes.  Recommended that they fill the wound bed with Polysporin and then cover with a hydrocolloid dressing.  Change this every 48 hours.  Recheck in 1 to 2 weeks depending upon his response to antibiotics  01/16/22  Although the wound appears to be slowly shrinking in size as shown in the photograph above, there is yellow exudate filling the wound bed with a foul odor.  The skin is erythematous warm and painful.  Cellulitis has not responded to Keflex  raising the concern over MRSA.  At that time, my plan was; Switch patient to Bactrim  double tab twice daily to cover MRSA given the lack of response to Keflex .  Also, resume wet-to-dry dressing changes daily as long as the wound is producing purulent exudate.  When hydrocolloid dressing changes once the cellulitis and exudate have improved.  Recheck later this week or sooner if worsening.  01/21/23 Thankfully erythema  has faded slightly.  The surrounding skin is not as hot or tender today.  The patient states the pain is better.  He seems to be responding to the Bactrim .  The wound has no exudate and it.  The patient has been performing wet-to-dry dressing changes since his last appointment.  Today the wound bed has healthy granulation tissue.  Patient has yet to hear from the wound clinic despite the referral being placed Past Medical History:  Diagnosis Date   Acute gastric ulcer with hemorrhage 05/06/2013   Arthritis    Asthma    Atrial enlargement, left    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    Cataract    COPD (chronic obstructive pulmonary  disease) (HCC)    Diabetes mellitus    Diabetic nephropathy (HCC)    Diverticulosis    ED (erectile dysfunction)    GERD (gastroesophageal reflux disease)    Gout    Hearing loss    Hiatal hernia 05/2008   EGD with HH and reflux esophagitis.    History of MI (myocardial infarction)    History of nuclear stress test 08/07/2010   dipyridamole; EKG negative for ischemia, low risk scan    Hyperlipidemia    Hyperplastic colon polyp 05/2008   Hypertension    Ischemic cardiomyopathy    EF 45%, with inferior wall motion abnormality    Myocardial infarction (HCC)    Valvular regurgitation    mitral and tricuspid (mild)   Past Surgical History:  Procedure Laterality Date   CARDIOVERSION N/A 08/16/2022   Procedure: CARDIOVERSION;  Surgeon: Jeffrie Oneil BROCKS, MD;  Location: [facility];  Service: Cardiovascular;  Laterality: N/A;   CORONARY ARTERY BYPASS GRAFT  2000   LIMA to LAD, free IMA to OM2, sequential graft to PLA & PLD   ESOPHAGOGASTRODUODENOSCOPY N/A 05/07/2013   Procedure: ESOPHAGOGASTRODUODENOSCOPY (EGD);  Surgeon: Lupita FORBES Commander, MD;  Location[facility];  Service: Endoscopy;  Laterality: N/A;   TEE WITHOUT CARDIOVERSION N/A 08/16/2022   Procedure: TRANSESOPHAGEAL ECHOCARDIOGRAM;  Surgeon: Jeffrie Oneil BROCKS, MD;  Location: [facility];  Service: Cardiovascular;  Laterality: N/A;   TRANSTHORACIC ECHOCARDIOGRAM  09/07/2010   EF 45-50%; LV systolic function mildly reduced; LA mildly dilated; mild-mod MR & mild-mod TR; aortic root sclerosis/calcification;    Current Outpatient Medications on File Prior to Visit  Medication Sig Dispense Refill   allopurinol  (ZYLOPRIM ) 300 MG tablet TAKE 1 TABLET BY MOUTH DAILY 90 tablet 0   amiodarone  (PACERONE ) 200 MG tablet Take 1 tablet by mouth twice daily for 14 days then decrease to 1 tablet once daily. 115 tablet 1   apixaban  (ELIQUIS ) 5 MG TABS tablet Take 1 tablet (5 mg total) by mouth 2 (two) times daily. 60 tablet 5   Blood Glucose  Monitoring Suppl (BLOOD GLUCOSE MONITOR SYSTEM) w/Device KIT 1 each by Does not apply route in the morning, at noon, and at bedtime. 1 kit 0   cephALEXin  (KEFLEX ) 500 MG capsule Take 1 capsule (500 mg total) by mouth 3 (three) times daily. (Patient not taking: Reported on 01/17/2023) 21 capsule 0   cephALEXin  (KEFLEX ) 500 MG capsule Take 1 capsule (500 mg total) by mouth 3 (three) times daily. (Patient not taking: Reported on 01/17/2023) 21 capsule 0   cephALEXin  (KEFLEX ) 500 MG capsule Take 1 capsule (500 mg total) by mouth 3 (three) times daily. 21 capsule 0   doxazosin  (CARDURA ) 2 MG tablet TAKE 1 TABLET BY MOUTH DAILY (Patient taking differently:  Take 2 mg by mouth at bedtime.) 90 tablet 3   empagliflozin  (JARDIANCE ) 10 MG TABS tablet Take 1 tablet (10 mg total) by mouth daily. 30 tablet 2   furosemide  (LASIX ) 40 MG tablet Take 1 tablet (40 mg total) by mouth 2 (two) times daily. 180 tablet 3   glipiZIDE  (GLIPIZIDE  XL) 5 MG 24 hr tablet Take 1 tablet (5 mg total) by mouth daily with breakfast. 30 tablet 3   Glucose Blood (BLOOD GLUCOSE TEST STRIPS) STRP Use in the morning, at noon, and at bedtime. May substitute to any manufacturer covered by patient's insurance. 100 strip 2   HYDROcodone -acetaminophen  (NORCO) 5-325 MG tablet Take 1 tablet by mouth every 6 (six) hours as needed for moderate pain (pain score 4-6). 30 tablet 0   insulin  aspart (FIASP  FLEXTOUCH) 100 UNIT/ML FlexTouch Pen Before each meal 3 times a day, 140-199 - 2 units, 200-250 - 4 units, 251-299 - 6 units,  300-349 - 8 units,  350 or above 10 units. (Patient taking differently: as needed. Before each meal 3 times a day, 140-199 - 2 units, 200-250 - 4 units, 251-299 - 6 units,  300-349 - 8 units,  350 or above 10 units.) 9 mL 0   Insulin  Pen Needle 32G X 4 MM MISC For 4 times a day insulin  injection under the skin, 1 month supply. 100 each 0   losartan  (COZAAR ) 25 MG tablet Take 1 tablet (25 mg total) by mouth daily. 90 tablet 1    metoprolol  tartrate (LOPRESSOR ) 50 MG tablet Take 2 tablets (100 mg total) by mouth 2 (two) times daily. 360 tablet 3   mirtazapine  (REMERON ) 30 MG tablet TAKE 1 TABLET BY MOUTH EVERY NIGHT AT BEDTIME FOR APPETITE 90 tablet 1   mupirocin ointment (BACTROBAN) 2 % SMARTSIG:1 Application Topical 2-3 Times Daily     pantoprazole  (PROTONIX ) 40 MG tablet Take 1 tablet (40 mg total) by mouth daily. 30 tablet 3   potassium chloride  SA (KLOR-CON  M) 20 MEQ tablet Take 1 tablet (20 mEq total) by mouth once for 1 dose. 7 tablet 0   predniSONE  (DELTASONE ) 5 MG tablet Take 3 tablets (15 mg total) by mouth daily with breakfast for 14 days, THEN 2.5 tablets (12.5 mg total) daily with breakfast for 14 days, THEN 2 tablets (10 mg total) daily with breakfast for 14 days. 105 tablet 0   simvastatin  (ZOCOR ) 40 MG tablet TAKE ONE TABLET BY MOUTH DAILY (Patient taking differently: Take 40 mg by mouth at bedtime.) 90 tablet 3   spironolactone  (ALDACTONE ) 25 MG tablet Take 1 tablet (25 mg total) by mouth 2 (two) times daily. 180 tablet 3   sulfamethoxazole -trimethoprim  (BACTRIM  DS) 800-160 MG tablet Take 1 tablet by mouth 2 (two) times daily. (Patient not taking: Reported on 01/17/2023) 14 tablet 0   sulfamethoxazole -trimethoprim  (BACTRIM  DS) 800-160 MG tablet Take 1 tablet by mouth 2 (two) times daily. 14 tablet 0   Tocilizumab  (ACTEMRA  IV) Inject 6 mg/kg into the vein every 28 (twenty-eight) days. Receives at [facility]     No current facility-administered medications on file prior to visit.     No Known Allergies Social History   Socioeconomic History   Marital status: Married    Spouse name: Not on file   Number of children: 1   Years of education: Not on file   Highest education level: Not on file  Occupational History   Not on file  Tobacco Use   Smoking status:  Former    Current packs/day: 0.00    Types: Cigarettes    Quit date: 11/04/1985    Years since quitting: 37.2   Smokeless  tobacco: Never  Vaping Use   Vaping status: Never Used  Substance and Sexual Activity   Alcohol  use: Not Currently    Alcohol /week: 2.0 - 3.0 standard drinks of alcohol     Types: 2 - 3 Cans of beer per week   Drug use: No   Sexual activity: Not on file  Other Topics Concern   Not on file  Social History Narrative   Not on file   Social Drivers of Health   Financial Resource Strain: Low Risk  (08/24/2022)   Overall Financial Resource Strain (CARDIA)    Difficulty of Paying Living Expenses: Not hard at all  Food Insecurity: No Food Insecurity (08/24/2022)   Hunger Vital Sign    Worried About Running Out of Food in the Last Year: Never true    Ran Out of Food in the Last Year: Never true  Transportation Needs: No Transportation Needs (08/24/2022)   PRAPARE - Administrator, Civil Service (Medical): No    Lack of Transportation (Non-Medical): No  Physical Activity: Sufficiently Active (07/10/2021)   Exercise Vital Sign    Days of Exercise per Week: 5 days    Minutes of Exercise per Session: 30 min  Stress: No Stress Concern Present (08/24/2022)   [employer] of Occupational Health - Occupational Stress Questionnaire    Feeling of Stress : Only a little  Social Connections: Socially Integrated (07/22/2022)   Social Connection and Isolation Panel [NHANES]    Frequency of Communication with Friends and Family: More than three times a week    Frequency of Social Gatherings with Friends and Family: More than three times a week    Attends Religious Services: More than 4 times per year    Active Member of [employer] or Organizations: Yes    Attends Banker Meetings: More than 4 times per year    Marital Status: Married  Catering Manager Violence: Not At Risk (08/13/2022)   Humiliation, Afraid, Rape, and Kick questionnaire    Fear of Current or Ex-Partner: No    Emotionally Abused: No    Physically Abused: No    Sexually Abused: No     Review of Systems  All  other systems reviewed and are negative.      Objective:   Physical Exam Vitals reviewed.  Constitutional:      Appearance: He is well-developed.  Neck:     Thyroid : No thyromegaly.     Vascular: No JVD.  Cardiovascular:     Rate and Rhythm: Normal rate. Rhythm irregular.     Pulses:          Dorsalis pedis pulses are 1+ on the left side.       Posterior tibial pulses are 1+ on the left side.     Heart sounds: Normal heart sounds. No murmur heard. Pulmonary:     Effort: Pulmonary effort is normal. No respiratory distress.     Breath sounds: Normal breath sounds. No wheezing or rales.  Chest:     Chest wall: No tenderness.  Abdominal:     General: Bowel sounds are normal. There is no distension.     Palpations: Abdomen is soft. There is no mass.     Tenderness: There is no abdominal tenderness. There is no guarding or rebound.  Musculoskeletal:  Cervical back: Neck supple.     Right lower leg: No edema.     Left lower leg: No edema.       Feet:  Feet:     Left foot:     Skin integrity: Ulcer and skin breakdown present. No erythema or warmth.  Lymphadenopathy:     Cervical: No cervical adenopathy.  Skin:    Findings: Erythema and lesion present.           Assessment & Plan:  Ulcerated, foot, left, with fat layer exposed (HCC)  Cellulitis of left lower extremity Cellulitis seems to have responded to Bactrim .  Complete the prescription.  Follow-up.  The first of next week for recheck.  Discontinue wet-to-dry dressing changes.  Begin daily dressing changes by applying a copious amount of Polysporin to the wound bed and then covering the wound with a hydrocolloid dressing.  We will only resume wet-to-dry dressing changes if the wound produces significant exudate.  I am going to replace the referral to a wound clinic and asked my staff to reach out to the referral department to check on the status

## 2023-01-24 ENCOUNTER — Telehealth: Payer: Self-pay

## 2023-01-24 NOTE — Telephone Encounter (Signed)
 Auth Submission: NO AUTH NEEDED Site of care: Site of care: [facility] Payer: Medicare A/B and [employer] Medication & CPT/J Code(s) submitted: Actemra  (Tocilizumab ) [id number]) Route of submission (phone, fax, portal):  Phone # Fax # Auth type: Buy/Bill PB Units/visits requested: 6mg /kg x 12 doses Reference number:  Approval from: 10/12/22 to 02/11/24   Medicare A/B will cover 80%, UHC will cover the remaining 20%.

## 2023-01-25 ENCOUNTER — Ambulatory Visit (INDEPENDENT_AMBULATORY_CARE_PROVIDER_SITE_OTHER): Payer: Medicare Other | Admitting: Family Medicine

## 2023-01-25 VITALS — BP 122/64 | HR 102 | Temp 97.9°F | Ht 68.0 in | Wt 145.0 lb

## 2023-01-25 DIAGNOSIS — L97522 Non-pressure chronic ulcer of other part of left foot with fat layer exposed: Secondary | ICD-10-CM

## 2023-01-25 DIAGNOSIS — L03116 Cellulitis of left lower limb: Secondary | ICD-10-CM

## 2023-01-25 MED ORDER — DOXYCYCLINE HYCLATE 100 MG PO TABS: 100.0000 mg | ORAL_TABLET | Freq: Two times a day (BID) | ORAL | 0 refills | Status: DC

## 2023-01-25 NOTE — Progress Notes (Signed)
 Subjective:    Patient ID: Donald Rios, male    DOB: 09/06/1940, 83 y.o.   MRN: [id number] 12/24/22 Patient is here today for recheck.  His creatinine was elevated to 1.9 so I recommended holding the Lasix .  He has not done that yet.  The appointment has been scheduled for the MRI of his foot and leg her next Tuesday.  He continues to complain of pain over the left anterior tibial tubercle.  The erythema has faded he is still tender to palpation and there subcutaneous edema in that area.  The wound on his foot shows healthy red granulation tissue at the base.  The skin edges are not undermined.  The wound itself is 3 cm x 2-1/2 cm.  There is no evidence of any secondary cellulitis.  12/31/22 Since I last saw the patient, he had an MRI of the left tibia that showed no evidence of osteomyelitis over the anterior tibial tubercle.  Therefore I believe the patient may have some insertional tendinitis in his patellar tendon.  However there is no evidence of cellulitis or osteomyelitis.  He also had an MRI of his left foot.  This showed no evidence of osteomyelitis in the foot.  I believe that the edema that they noticed on the MRI was likely the wet-to-dry dressing changes but the patient is performing.  The wound is minimally smaller today.  Today on exam it is 3x2.1 cm.  The wound has healthy granulation tissue all throughout.  There is no drainage.  There is no pain.  There is no evidence of secondary cellulitis.  There is no foul odor.  Patient is also due to recheck a BMP to monitor his renal function.  He continues to hold Lasix .  He continues to manage his peripheral edema with compression hose. 01/07/23  Recently, the patient states that he was having more pain with dressing changes.  He has been performing wet-to-dry dressing changes every day.  The wound has shrunk in size.  It is now 2 cm x 2.5 cm.  However the skin on the dorsum of the foot, over the lateral aspect of the ankle, has become  erythematous and warm and tender to the touch.  I believe the patient is developing secondary cellulitis.  He has been taking tramadol  for the pain.  At that time, my plan was: Wound continues to slowly heal.  Treat cellulitis with Keflex  500 mg p.o. 3 times daily for 7 days.  Recheck next week if no better or sooner if worse.  Discontinue wet-to-dry dressing changes.  Begin hydrocolloid dressing changes.  Recommended that they fill the wound bed with Polysporin and then cover with a hydrocolloid dressing.  Change this every 48 hours.  Recheck in 1 to 2 weeks depending upon his response to antibiotics  01/16/22  Although the wound appears to be slowly shrinking in size as shown in the photograph above, there is yellow exudate filling the wound bed with a foul odor.  The skin is erythematous warm and painful.  Cellulitis has not responded to Keflex  raising the concern over MRSA.  At that time, my plan was; Switch patient to Bactrim  double tab twice daily to cover MRSA given the lack of response to Keflex .  Also, resume wet-to-dry dressing changes daily as long as the wound is producing purulent exudate.  When hydrocolloid dressing changes once the cellulitis and exudate have improved.  Recheck later this week or sooner if worsening.  01/21/23 Thankfully erythema  has faded slightly.  The surrounding skin is not as hot or tender today.  The patient states the pain is better.  He seems to be responding to the Bactrim .  The wound has no exudate and it.  The patient has been performing wet-to-dry dressing changes since his last appointment.  Today the wound bed has healthy granulation tissue.  Patient has yet to hear from the wound clinic despite the referral being placed.  At that time, my plan was: Cellulitis seems to have responded to Bactrim .  Complete the prescription.  Follow-up.  The first of next week for recheck.  Discontinue wet-to-dry dressing changes.  Begin daily dressing changes by applying a copious  amount of Polysporin to the wound bed and then covering the wound with a hydrocolloid dressing.  We will only resume wet-to-dry dressing changes if the wound produces significant exudate.    01/25/23  Patient has erythema over the right great toe and over the medial portion of the left foot.  Skin there is erythematous warm and painful.  He has been on Keflex  and then switched to Bactrim  to cover possible MRSA.  However the erythema seems to have worsened over the medial portion of the foot.  Thankfully over the wound itself there is no purulent drainage.  The wound itself is approximately 2.2 cm x 2 cm.  It is roughly unchanged.  There is healthy granulation tissue in the base of the wound.  The patient has an appointment Tuesday with the wound clinic. 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    History of MI (myocardial infarction)    History of nuclear stress test 08/07/2010   dipyridamole; EKG negative for ischemia, low risk scan    Hyperlipidemia    Hyperplastic colon polyp 05/2008   Hypertension    Ischemic cardiomyopathy    EF 45%, with inferior wall motion abnormality    Myocardial infarction (HCC)    Valvular regurgitation    mitral and tricuspid (mild)   Past Surgical History:  Procedure Laterality Date   CARDIOVERSION N/A 08/16/2022   Procedure: CARDIOVERSION;  Surgeon: Jeffrie Oneil BROCKS, MD;  Location: [facility];  Service: Cardiovascular;  Laterality: N/A;   CORONARY ARTERY BYPASS GRAFT  2000   LIMA to LAD, free IMA to OM2, sequential  graft to PLA & PLD   ESOPHAGOGASTRODUODENOSCOPY N/A 05/07/2013   Procedure: ESOPHAGOGASTRODUODENOSCOPY (EGD);  Surgeon: Lupita FORBES Commander, MD;  Location: [facility] ENDOSCOPY;  Service: Endoscopy;  Laterality: N/A;   TEE WITHOUT CARDIOVERSION N/A 08/16/2022   Procedure: TRANSESOPHAGEAL ECHOCARDIOGRAM;  Surgeon: Jeffrie Oneil BROCKS, MD;  Location: [facility] INVASIVE CV [facility];  Service: Cardiovascular;  Laterality: N/A;   TRANSTHORACIC ECHOCARDIOGRAM  09/07/2010   EF 45-50%; LV systolic function mildly reduced; LA mildly dilated; mild-mod MR & mild-mod TR; aortic root sclerosis/calcification;    Current Outpatient Medications on File Prior to Visit  Medication Sig Dispense Refill   predniSONE  (DELTASONE ) 5 MG tablet Take  15 mg by mouth daily with breakfast.     allopurinol  (ZYLOPRIM ) 300 MG tablet TAKE 1 TABLET BY MOUTH DAILY 90 tablet 0   amiodarone  (PACERONE ) 200 MG tablet Take 1 tablet by mouth twice daily for 14 days then decrease to 1 tablet once daily. 115 tablet 1   apixaban  (ELIQUIS ) 5 MG TABS tablet Take 1 tablet (5 mg total) by mouth 2 (two) times daily. 60 tablet 5   Blood Glucose Monitoring Suppl (BLOOD GLUCOSE MONITOR SYSTEM) w/Device KIT 1 each by Does not apply route in the morning, at noon, and at bedtime. 1 kit 0   doxazosin  (CARDURA ) 2 MG tablet TAKE 1 TABLET BY MOUTH DAILY (Patient taking differently: Take 2 mg by mouth at bedtime.) 90 tablet 3   empagliflozin  (JARDIANCE ) 10 MG TABS tablet Take 1 tablet (10 mg total) by mouth daily. 30 tablet 2   furosemide  (LASIX ) 40 MG tablet Take 1 tablet (40 mg total) by mouth 2 (two) times daily. 180 tablet 3   glipiZIDE  (GLIPIZIDE  XL) 5 MG 24 hr tablet Take 1 tablet (5 mg total) by mouth daily with breakfast. 30 tablet 3   Glucose Blood (BLOOD GLUCOSE TEST STRIPS) STRP Use in the morning, at noon, and at bedtime. May substitute to any manufacturer covered by patient's insurance. 100 strip 2   HYDROcodone -acetaminophen  (NORCO) 5-325 MG tablet Take 1 tablet by mouth  every 6 (six) hours as needed for moderate pain (pain score 4-6). 30 tablet 0   insulin  aspart (FIASP  FLEXTOUCH) 100 UNIT/ML FlexTouch Pen Before each meal 3 times a day, 140-199 - 2 units, 200-250 - 4 units, 251-299 - 6 units,  300-349 - 8 units,  350 or above 10 units. (Patient taking differently: as needed. Before each meal 3 times a day, 140-199 - 2 units, 200-250 - 4 units, 251-299 - 6 units,  300-349 - 8 units,  350 or above 10 units.) 9 mL 0   Insulin  Pen Needle 32G X 4 MM MISC For 4 times a day insulin  injection under the skin, 1 month supply. 100 each 0   losartan  (COZAAR ) 25 MG tablet Take 1 tablet (25 mg total) by mouth daily. 90 tablet 1   metoprolol  tartrate (LOPRESSOR ) 50 MG tablet Take 2 tablets (100 mg total) by mouth 2 (two) times daily. 360 tablet 3   mirtazapine  (REMERON ) 30 MG tablet TAKE 1 TABLET BY MOUTH EVERY NIGHT AT BEDTIME FOR APPETITE 90 tablet 1   mupirocin ointment (BACTROBAN) 2 % SMARTSIG:1 Application Topical 2-3 Times Daily     pantoprazole  (PROTONIX ) 40 MG tablet Take 1 tablet (40 mg total) by mouth daily. 30 tablet 3   potassium chloride  SA (KLOR-CON  M) 20 MEQ tablet Take 1 tablet (20 mEq total) by mouth once for 1 dose. 7 tablet 0   simvastatin  (ZOCOR ) 40 MG tablet TAKE ONE TABLET BY MOUTH DAILY (Patient taking differently: Take 40 mg by mouth at bedtime.) 90 tablet 3   spironolactone  (ALDACTONE ) 25 MG tablet Take 1 tablet (25 mg total) by mouth 2 (two) times daily. 180 tablet 3   Tocilizumab  (ACTEMRA  IV) Inject 6 mg/kg into the vein every 28 (twenty-eight) days. Receives at [employer]     No current facility-administered medications on file prior to visit.     No Known Allergies Social History   Socioeconomic History   Marital status: Married    Spouse name: Not on file   Number of children: 1   Years of  education: Not on file   Highest education level: Not on file  Occupational History   Not on file  Tobacco Use   Smoking status:  Former    Current packs/day: 0.00    Types: Cigarettes    Quit date: 11/04/1985    Years since quitting: 37.2   Smokeless tobacco: Never  Vaping Use   Vaping status: Never Used  Substance and Sexual Activity   Alcohol  use: Not Currently    Alcohol /week: 2.0 - 3.0 standard drinks of alcohol     Types: 2 - 3 Cans of beer per week   Drug use: No   Sexual activity: Not on file  Other Topics Concern   Not on file  Social History Narrative   Not on file   Social Drivers of Health   Financial Resource Strain: Low Risk  (08/24/2022)   Overall Financial Resource Strain (CARDIA)    Difficulty of Paying Living Expenses: Not hard at all  Food Insecurity: No Food Insecurity (08/24/2022)   Hunger Vital Sign    Worried About Running Out of Food in the Last Year: Never true    Ran Out of Food in the Last Year: Never true  Transportation Needs: No Transportation Needs (08/24/2022)   PRAPARE - Administrator, Civil Service (Medical): No    Lack of Transportation (Non-Medical): No  Physical Activity: Sufficiently Active (07/10/2021)   Exercise Vital Sign    Days of Exercise per Week: 5 days    Minutes of Exercise per Session: 30 min  Stress: No Stress Concern Present (08/24/2022)   [employer] of Occupational Health - Occupational Stress Questionnaire    Feeling of Stress : Only a little  Social Connections: Socially Integrated (07/22/2022)   Social Connection and Isolation Panel [NHANES]    Frequency of Communication with Friends and Family: More than three times a week    Frequency of Social Gatherings with Friends and Family: More than three times a week    Attends Religious Services: More than 4 times per year    Active Member of [employer] or Organizations: Yes    Attends Banker Meetings: More than 4 times per year    Marital Status: Married  Catering Manager Violence: Not At Risk (08/13/2022)   Humiliation, Afraid, Rape, and Kick questionnaire    Fear of Current  or Ex-Partner: No    Emotionally Abused: No    Physically Abused: No    Sexually Abused: No     Review of Systems  All other systems reviewed and are negative.      Objective:   Physical Exam Vitals reviewed.  Constitutional:      Appearance: He is well-developed.  Neck:     Thyroid : No thyromegaly.     Vascular: No JVD.  Cardiovascular:     Rate and Rhythm: Normal rate. Rhythm irregular.     Pulses:          Dorsalis pedis pulses are 1+ on the left side.       Posterior tibial pulses are 1+ on the left side.     Heart sounds: Normal heart sounds. No murmur heard. Pulmonary:     Effort: Pulmonary effort is normal. No respiratory distress.     Breath sounds: Normal breath sounds. No wheezing or rales.  Chest:     Chest wall: No tenderness.  Abdominal:     General: Bowel sounds are normal. There is no distension.     Palpations: Abdomen is  soft. There is no mass.     Tenderness: There is no abdominal tenderness. There is no guarding or rebound.  Musculoskeletal:     Cervical back: Neck supple.     Right lower leg: No edema.     Left lower leg: No edema.       Feet:  Feet:     Left foot:     Skin integrity: Ulcer and skin breakdown present. No erythema or warmth.  Lymphadenopathy:     Cervical: No cervical adenopathy.  Skin:    Findings: Erythema and lesion present.           Assessment & Plan:  Ulcerated, foot, left, with fat layer exposed (HCC)  Cellulitis of left lower extremity I will switch the patient to doxycycline  in case there is resistance to Bactrim .  He will take 100 mg twice daily for 7 days.  If worsening I would recommend going to the hospital for possible vancomycin  and continue topical antibiotic/Polysporin with hydrocolloid dressing changes on a daily basis.  Patient has an appointment to see wound clinic on Tuesday.

## 2023-01-27 ENCOUNTER — Ambulatory Visit: Payer: Medicare Other

## 2023-01-27 VITALS — BP 102/59 | HR 71 | Temp 98.0°F | Resp 20 | Ht 68.0 in | Wt 147.4 lb

## 2023-01-27 DIAGNOSIS — M316 Other giant cell arteritis: Secondary | ICD-10-CM | POA: Diagnosis not present

## 2023-01-27 DIAGNOSIS — Z79899 Other long term (current) drug therapy: Secondary | ICD-10-CM | POA: Diagnosis not present

## 2023-01-27 MED ORDER — SODIUM CHLORIDE 0.9 % IV SOLN
6.0000 mg/kg | Freq: Once | INTRAVENOUS | Status: AC
  Administered 2023-01-27: 402 mg via INTRAVENOUS
  Filled 2023-01-27: qty 20.1

## 2023-01-27 MED ORDER — ACETAMINOPHEN 325 MG PO TABS
650.0000 mg | ORAL_TABLET | Freq: Once | ORAL | Status: AC
  Administered 2023-01-27: 650 mg via ORAL
  Filled 2023-01-27: qty 2

## 2023-01-27 MED ORDER — DIPHENHYDRAMINE HCL 25 MG PO CAPS
25.0000 mg | ORAL_CAPSULE | Freq: Once | ORAL | Status: AC
  Administered 2023-01-27: 25 mg via ORAL
  Filled 2023-01-27: qty 1

## 2023-01-27 NOTE — Progress Notes (Signed)
Diagnosis: Temporal arteritis   Provider:  Chilton Greathouse MD  Procedure: IV Infusion  IV Type: Peripheral, IV Location: R wrist  Actemra (Tocilizumab), Dose: 402 mg  Infusion Start Time: 1451  Infusion Stop Time: 1551  Post Infusion IV Care: Patient declined observation and Peripheral IV Discontinued  Discharge: Condition: Good, Destination: Home . AVS Declined  Performed by:  Wyvonne Lenz, RN

## 2023-01-28 ENCOUNTER — Other Ambulatory Visit: Payer: Self-pay | Admitting: Family Medicine

## 2023-01-28 ENCOUNTER — Telehealth: Payer: Self-pay

## 2023-01-28 DIAGNOSIS — E119 Type 2 diabetes mellitus without complications: Secondary | ICD-10-CM

## 2023-01-28 NOTE — Telephone Encounter (Signed)
Copied from CRM [id number]. Topic: General - Other >> Jan 28, 2023  3:53 PM [name] wrote: Reason for CRM: pt wanted to inform Dr. Tanya Nones that his left foot is not quite as red and still is swollen. Pt is still on medication that Dr. Tanya Nones has prescribe to him

## 2023-02-01 ENCOUNTER — Other Ambulatory Visit: Payer: Self-pay

## 2023-02-01 ENCOUNTER — Ambulatory Visit (HOSPITAL_COMMUNITY): Payer: Medicare Other | Attending: Family Medicine | Admitting: Physical Therapy

## 2023-02-01 DIAGNOSIS — M79672 Pain in left foot: Secondary | ICD-10-CM | POA: Diagnosis not present

## 2023-02-01 DIAGNOSIS — S91302S Unspecified open wound, left foot, sequela: Secondary | ICD-10-CM | POA: Insufficient documentation

## 2023-02-01 NOTE — Therapy (Signed)
OUTPATIENT PHYSICAL THERAPY NEURO EVALUATION   Patient Name: Donald Rios MRN: [id number] DOB:November 29, 1940, 83 y.o., male Today's Date: 02/01/2023   PCP: Lynnea Ferrier REFERRING PROVIDER: Lynnea Ferrier  END OF SESSION:  PT End of Session - 02/01/23 1342     Visit Number 1    Number of Visits 12    Date for PT Re-Evaluation 03/15/23    Authorization Type medicare/[employer]HC    Progress Note Due on Visit 10    PT Start Time 1300    PT Stop Time 1345    PT Time Calculation (min) 45 min    Activity Tolerance Patient tolerated treatment well    Behavior During Therapy [facility] for tasks assessed/performed             Past Medical History:  Diagnosis Date   Acute gastric ulcer with hemorrhage 05/06/2013   Arthritis    Asthma    Atrial enlargement, left    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    History of MI (myocardial infarction)    History of nuclear stress test 08/07/2010   dipyridamole; EKG negative for ischemia, low risk scan    Hyperlipidemia    Hyperplastic colon polyp 05/2008   Hypertension    Ischemic cardiomyopathy    EF 45%, with inferior wall motion abnormality    Myocardial infarction (HCC)    Valvular regurgitation    mitral and tricuspid (mild)   Past Surgical History:  Procedure Laterality Date   CARDIOVERSION N/A 08/16/2022   Procedure: CARDIOVERSION;  Surgeon: Jake Bathe, MD;  Location: [facility] INVAS[facility] LAB;  Service: Cardiovascular;  Laterality: N/A;   CORONARY ARTERY BYPASS GRAFT  2000   LIMA to LAD, free IMA to OM2, sequential graft to PLA & PLD   ESOPHAGOGASTRODUODENOSCOPY N/A 05/07/2013   Procedure: ESOPHAGOGASTRODUODENOSCOPY (EGD);  Surgeon: Iva Boop, MD;  Location: [facility] ENDOSCOPY;  Service: Endoscopy;  Laterality: N/A;   TEE WITHOUT CARDIOVERSION N/A 08/16/2022   Procedure: TRANSESOPHAGEAL ECHOCARDIOGRAM;  Surgeon: Jake Bathe, MD;  Location: [facility]VAS[facility] LAB;  Service: Cardiovascular;  Laterality: N/A;   TRANSTHORACIC ECHOCARDIOGRAM  09/07/2010   EF 45-50%; LV systolic function mildly reduced; LA mildly dilated; mild-mod MR & mild-mod TR; aortic root sclerosis/calcification;    Patient Active Problem List   Diagnosis Date Noted   High risk medication use 10/25/2022   Screening for tuberculosis 10/25/2022   New onset atrial fibrillation (HCC) 08/15/2022   Cardiomyopathy (HCC) 08/15/2022   Temporal arteritis (HCC) 08/13/2022   Anemia 08/04/2022   Protein-calorie malnutrition (HCC) 11/21/2018   Hx of CABG 08/30/2017   Cardiomyopathy, ischemic 12/23/2015   Chronic systolic congestive heart failure, NYHA class 1 (HCC) 12/23/2015   Acute gastric ulcer with hemorrhage 05/06/2013   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    Mild tricuspid regurgitation    Mitral regurgitation    Atrial enlargement, left    Hearing loss    Diverticulosis    Hiatal hernia    Gout    DM2 (diabetes mellitus, type 2) (HCC)    GERD (gastroesophageal reflux disease)    Mixed hyperlipidemia  Essential hypertension     ONSET DATE: 11/16/23  REFERRING DIAG: N82.956 (ICD-10-CM) - Ulcerated, foot, left, with fat layer exposed (HCC) L03.116 (ICD-10-CM) - Cellulitis of left lower extremity  THERAPY DIAG:  Open wound of left foot with complication, sequela  Pain in left foot  Rationale for Evaluation and Treatment: Rehabilitation     Wound Therapy - 02/01/23 0001     Subjective Pt states that he had a temporal infection in August that took his left eye.  He has had 5 other infections since and has been on multiple antibiotics.  His wounds have not been cultured that he knows of.  The pain is high and he is unable to walk on his foot so he  has been very sedentary.    Patient and Family Stated Goals infection to go away wound to close    Date of Onset 11/16/22    Prior Treatments antibiotics, self dressing, MD dressing    Pain Scale 0-10    Pain Score 7     Pain Type Chronic pain    Pain Location Foot    Pain Orientation Left    Pain Descriptors / Indicators Aching;Burning    Pain Onset With Activity    Patients Stated Pain Goal 0    Evaluation and Treatment Procedures Explained to Patient/Family Yes    Evaluation and Treatment Procedures agreed to    Wound Properties Date First Assessed: 02/01/23 Time First Assessed: 1310 Wound Type: Diabetic ulcer Location: Foot Location Orientation: Anterior;Left Wound Description (Comments): dorsal aspect Present on Admission: Yes   Wound Image Images linked: 1    Dressing Type Gauze (Comment)    Dressing Changed Changed    Dressing Status Clean    Dressing Change Frequency PRN    Site / Wound Assessment Granulation tissue;Painful;Yellow    % Wound base Red or Granulating 80%    % Wound base Yellow/Fibrinous Exudate 20%    Peri-wound Assessment Edema;Erythema (blanchable)    Wound Length (cm) 2.8 cm    Wound Width (cm) 1.2 cm    Wound Depth (cm) 0.3 cm    Wound Volume (cm^3) 1.01 cm^3    Wound Surface Area (cm^2) 3.36 cm^2    Undermining (cm) laterally .4    Drainage Amount Moderate    Treatment Cleansed;Debridement (Selective)    Wound Therapy - Clinical Statement see below    Wound Therapy - Functional Problem List difficulty with bathing, dressing and walking    Factors Delaying/Impairing Wound Healing Altered sensation;Diabetes Mellitus;Infection - systemic/local;Immobility    Hydrotherapy Plan Debridement;Dressing change;Patient/family education    Wound Therapy - Frequency 2X / week   for 6 weeks   Wound Therapy - Current Recommendations PT;Other (comment)   culture   Wound Plan cleans, moisturize debride and compression for great toe and foot.    Dressing  honey  impregnated 2x2 with corner packed into undermining, 4x4, toe wrap for great toe, kerlix and coban for Lt foot.               PATIENT EDUCATION: Education details: ankle pumps, do not get dressing wet, remove if painful Person educated: Patient Education method: Explanation, Demonstration, and Verbal cues Education comprehension: verbalized understanding and returned demonstration   HOME EXERCISE PROGRAM: Ankle pumps    GOALS: Goals reviewed with patient? No  SHORT TERM GOALS: Target date: 03/05/23  Pt wound to be 100% granulated  Baseline: Goal status: INITIAL  2.  PT to be completing ankle pumps 3 x a  day to improve DF as well as increase blood flow to wound  Baseline:  Goal status: INITIAL  3.  Big toe to no longer be red Baseline:  Goal status: INITIAL    LONG TERM GOALS: Target date: 03/22/23  PT wound to be healed  Baseline:  Goal status: INITIAL  2.  PT to have no pain in Lt foot to be able to walk regular for up to 40 minutes Baseline:  Goal status: INITIAL   ASSESSMENT:  CLINICAL IMPRESSION: Patient is a 83 y.o. male who was seen today for physical therapy evaluation and treatment for L97.522 (ICD-10-CM) - Ulcerated, foot, left, with fat layer exposed (HCC) L03.116 (ICD-10-CM) - Cellulitis of left lower extremity.   Evaluation demonstrates increased redness in great toe and Foot area, a non healing wound of the left foot that has undermining, increased swelling of the left foot and increased pain.  Donald Rios will benefit from skilled PT to address these issues and maximize his functional ability.    OBJECTIVE IMPAIRMENTS: increased edema, pain, and decrease skin integrity  .   ACTIVITY LIMITATIONS: toileting, dressing, and locomotion level  PARTICIPATION LIMITATIONS: shopping, community activity, and yard work  PERSONAL FACTORS: Age, Time since onset of injury/illness/exacerbation, and 1 comorbidity: DM  are also affecting patient's functional  outcome.   REHAB POTENTIAL: Good  CLINICAL DECISION MAKING: Evolving/moderate complexity  EVALUATION COMPLEXITY: Moderate  PLAN: PT FREQUENCY: 2x/week  PT DURATION: 6 weeks  PLANNED INTERVENTIONS: 97535- Self Care, 19147- Manual therapy, 97597- Wound care (first 20 sq cm),  PLAN FOR NEXT SESSION: wound care, manual if needed.  Virgina Organ, PT CLT [phone] 02/01/2023, 2:42 PM

## 2023-02-02 DIAGNOSIS — D225 Melanocytic nevi of trunk: Secondary | ICD-10-CM | POA: Diagnosis not present

## 2023-02-02 DIAGNOSIS — L821 Other seborrheic keratosis: Secondary | ICD-10-CM | POA: Diagnosis not present

## 2023-02-02 DIAGNOSIS — Z09 Encounter for follow-up examination after completed treatment for conditions other than malignant neoplasm: Secondary | ICD-10-CM | POA: Diagnosis not present

## 2023-02-02 DIAGNOSIS — Z08 Encounter for follow-up examination after completed treatment for malignant neoplasm: Secondary | ICD-10-CM | POA: Diagnosis not present

## 2023-02-02 DIAGNOSIS — Z872 Personal history of diseases of the skin and subcutaneous tissue: Secondary | ICD-10-CM | POA: Diagnosis not present

## 2023-02-02 DIAGNOSIS — L89319 Pressure ulcer of right buttock, unspecified stage: Secondary | ICD-10-CM | POA: Diagnosis not present

## 2023-02-02 DIAGNOSIS — Z85820 Personal history of malignant melanoma of skin: Secondary | ICD-10-CM | POA: Diagnosis not present

## 2023-02-02 DIAGNOSIS — Z713 Dietary counseling and surveillance: Secondary | ICD-10-CM | POA: Diagnosis not present

## 2023-02-02 DIAGNOSIS — L89329 Pressure ulcer of left buttock, unspecified stage: Secondary | ICD-10-CM | POA: Diagnosis not present

## 2023-02-02 DIAGNOSIS — L814 Other melanin hyperpigmentation: Secondary | ICD-10-CM | POA: Diagnosis not present

## 2023-02-04 ENCOUNTER — Telehealth: Payer: Self-pay

## 2023-02-04 ENCOUNTER — Ambulatory Visit (HOSPITAL_COMMUNITY): Payer: Medicare Other | Admitting: Physical Therapy

## 2023-02-04 ENCOUNTER — Other Ambulatory Visit: Payer: Self-pay | Admitting: Family Medicine

## 2023-02-04 DIAGNOSIS — S91302S Unspecified open wound, left foot, sequela: Secondary | ICD-10-CM | POA: Diagnosis not present

## 2023-02-04 DIAGNOSIS — M79672 Pain in left foot: Secondary | ICD-10-CM

## 2023-02-04 MED ORDER — AMOXICILLIN-POT CLAVULANATE 875-125 MG PO TABS: 1.0000 | ORAL_TABLET | Freq: Two times a day (BID) | ORAL | 0 refills | Status: DC

## 2023-02-04 NOTE — Telephone Encounter (Signed)
Copied from CRM [id number]. Topic: Clinical - Medication Question >> Feb 04, 2023  3:06 PM Monisha R wrote: Reason for CRM:  Patient went to wound clinic and was told he needs more antibiotics for wound, does not seem to be healing properly.

## 2023-02-04 NOTE — Therapy (Signed)
OUTPATIENT PHYSICAL THERAPY Wound  Treatment    Patient Name: Donald Rios MRN: [id number] DOB:December 07, 1940, 83 y.o., male Today's Date: 02/04/2023   PCP: Lynnea Ferrier REFERRING PROVIDER: Lynnea Ferrier  END OF SESSION:  PT End of Session - 02/04/23 1500     Visit Number 2    Number of Visits 12    Date for PT Re-Evaluation 03/15/23    Authorization Type medicare/[employer]    Progress Note Due on Visit 10    PT Start Time 1130    PT Stop Time 1205    PT Time Calculation (min) 35 min    Activity Tolerance Patient tolerated treatment well    Behavior During Therapy [facility] for tasks assessed/performed             Past Medical History:  Diagnosis Date   Acute gastric ulcer with hemorrhage 05/06/2013   Arthritis    Asthma    Atrial enlargement, left    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    History of MI (myocardial infarction)    History of nuclear stress test 08/07/2010   dipyridamole; EKG negative for ischemia, low risk scan    Hyperlipidemia    Hyperplastic colon polyp 05/2008   Hypertension    Ischemic cardiomyopathy    EF 45%, with inferior wall motion abnormality    Myocardial infarction (HCC)    Valvular regurgitation    mitral and tricuspid (mild)   Past Surgical History:  Procedure Laterality Date   CARDIOVERSION N/A 08/16/2022   Procedure: CARDIOVERSION;  Surgeon: Jake Bathe, MD;  Location: [facility] INVASIVE CV LAB;  Service: Cardiovascular;  Laterality: N/A;   CORONARY ARTERY BYPASS GRAFT  2000   LIMA to LAD, free IMA to OM2, sequential graft to PLA & PLD   ESOPHAGOGASTRODUODENOSCOPY N/A 05/07/2013   Procedure: ESOPHAGOGASTRODUODENOSCOPY (EGD);  Surgeon: Iva Boop, MD;  Location: [facility] ENDOSCOPY;  Service: Endoscopy;  Laterality: N/A;   TEE WITHOUT CARDIOVERSION N/A 08/16/2022   Procedure: TRANSESOPHAGEAL ECHOCARDIOGRAM;  Surgeon: Jake Bathe, MD;  Location: [facility] INVASIVE CV LAB;  Service: Cardiovascular;  Laterality: N/A;   TRANSTHORACIC ECHOCARDIOGRAM  09/07/2010   EF 45-50%; LV systolic function mildly reduced; LA mildly dilated; mild-mod MR & mild-mod TR; aortic root sclerosis/calcification;    Patient Active Problem List   Diagnosis Date Noted   High risk medication use 10/25/2022   Screening for tuberculosis 10/25/2022   New onset atrial fibrillation (HCC) 08/15/2022   Cardiomyopathy (HCC) 08/15/2022   Temporal arteritis (HCC) 08/13/2022   Anemia 08/04/2022   Protein-calorie malnutrition (HCC) 11/21/2018   Hx of CABG 08/30/2017   Cardiomyopathy, ischemic 12/23/2015   Chronic systolic congestive heart failure, NYHA class 1 (HCC) 12/23/2015   Acute gastric ulcer with hemorrhage 05/06/2013   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    Mild tricuspid regurgitation    Mitral regurgitation    Atrial enlargement, left    Hearing loss    Diverticulosis    Hiatal hernia    Gout    DM2 (diabetes mellitus, type 2) (HCC)    GERD (gastroesophageal reflux disease)  Mixed hyperlipidemia    Essential hypertension     ONSET DATE: 11/16/23  REFERRING DIAG: O96.295 (ICD-10-CM) - Ulcerated, foot, left, with fat layer exposed (HCC) L03.116 (ICD-10-CM) - Cellulitis of left lower extremity  THERAPY DIAG:  Open wound of left foot with complication, sequela  Pain in left foot  Rationale for Evaluation and Treatment: Rehabilitation     Wound Therapy - 02/04/23 0001     Subjective Pt states that his foot has hurt more since the dressing was placed on it.  Therapist reminded pt that she had stated that if it hurt to take it off.    Patient and Family Stated Goals infection to go away wound to close    Date of Onset 11/16/22    Prior  Treatments antibiotics, self dressing, MD dressing    Pain Scale 0-10    Pain Score 8     Pain Type Chronic pain    Pain Location Foot    Pain Orientation Left    Pain Descriptors / Indicators Aching;Burning    Pain Onset With Activity    Patients Stated Pain Goal 0    Evaluation and Treatment Procedures Explained to Patient/Family Yes    Evaluation and Treatment Procedures agreed to    Wound Properties Date First Assessed: 02/01/23 Time First Assessed: 1310 Wound Type: Diabetic ulcer Location: Foot Location Orientation: Anterior;Left Wound Description (Comments): dorsal aspect Present on Admission: Yes   Wound Image Images linked: 1    Dressing Type Gauze (Comment)    Dressing Changed Changed    Dressing Status Clean    Dressing Change Frequency PRN    Site / Wound Assessment Granulation tissue;Painful;Yellow    % Wound base Red or Granulating 90%    % Wound base Yellow/Fibrinous Exudate 10%    Peri-wound Assessment Edema;Erythema (blanchable)    Drainage Amount Moderate    Drainage Description Purulent    Treatment Cleansed;Debridement (Selective)    Wound Therapy - Clinical Statement see below    Wound Therapy - Functional Problem List difficulty with bathing, dressing and walking    Factors Delaying/Impairing Wound Healing Altered sensation;Diabetes Mellitus;Infection - systemic/local;Immobility    Hydrotherapy Plan Debridement;Dressing change;Patient/family education    Wound Therapy - Frequency 2X / week   for 6 weeks   Wound Therapy - Current Recommendations PT;Other (comment)   culture   Wound Plan cleans, moisturize debride and compression for great toe and foot.    Dressing  Honey impregnated 2x2 with tip packed into hole located on the lateral aspect of the wound.  Vaseling to foot, 3" coban and netting .               PATIENT EDUCATION: Education details: ankle pumps, do not get dressing wet, remove if painful Person educated: Patient Education method:  Explanation, Demonstration, and Verbal cues Education comprehension: verbalized understanding and returned demonstration   HOME EXERCISE PROGRAM: Ankle pumps    GOALS: Goals reviewed with patient? No  SHORT TERM GOALS: Target date: 03/05/23  Pt wound to be 100% granulated  Baseline: Goal status: IN PROGRESS  2.  PT to be completing ankle pumps 3 x a day to improve DF as well as increase blood flow to wound  Baseline:  Goal status: IN PROGRESS  3.  Big toe to no longer be red Baseline:  Goal status: IN PROGRESS    LONG TERM GOALS: Target date: 03/22/23  PT wound to be healed  Baseline:  Goal status: IN PROGRESS  2.  PT to have no pain in Lt foot to be able to walk regular for up to 40 minutes Baseline:  Goal status: IN PROGRESS   ASSESSMENT:  CLINICAL IMPRESSION:  Pt with increased pain with coban, pt had stated that his circulation was checked by cardiology and was fine but upon questioning it appears that this was venous status not arterial.  Pt has had heart stents so we will discontinue the coban to see if this improves the comfort of the dressing.  Pt foot and toe continue to be red and warm to touch.  Pt drew line where red stops and informed pt that she feels antibiotics would be warrented.  Pt continues to demonstrates increased redness in great toe and Foot area, a non healing wound of the left foot that has undermining, increased swelling of the left foot and increased pain.  Donald Rios will benefit from skilled PT to address these issues and maximize his functional ability.    OBJECTIVE IMPAIRMENTS: increased edema, pain, and decrease skin integrity  .   ACTIVITY LIMITATIONS: toileting, dressing, and locomotion level  PARTICIPATION LIMITATIONS: shopping, community activity, and yard work  PERSONAL FACTORS: Age, Time since onset of injury/illness/exacerbation, and 1 comorbidity: DM  are also affecting patient's functional outcome.   REHAB POTENTIAL:  Good  CLINICAL DECISION MAKING: Evolving/moderate complexity  EVALUATION COMPLEXITY: Moderate  PLAN: PT FREQUENCY: 2x/week  PT DURATION: 6 weeks  PLANNED INTERVENTIONS: 97535- Self Care, 09811- Manual therapy, 97597- Wound care (first 20 sq cm),  PLAN FOR NEXT SESSION: wound care, manual if needed.  Virgina Organ, PT CLT [phone] 02/04/2023, 3:04 PM

## 2023-02-08 ENCOUNTER — Other Ambulatory Visit: Payer: Self-pay

## 2023-02-08 ENCOUNTER — Encounter (HOSPITAL_COMMUNITY): Payer: Self-pay | Admitting: Emergency Medicine

## 2023-02-08 ENCOUNTER — Inpatient Hospital Stay (HOSPITAL_COMMUNITY)
Admission: EM | Admit: 2023-02-08 | Discharge: 2023-02-15 | DRG: 854 | Disposition: A | Discharge status: Home Health | Payer: Medicare Other | Attending: Family Medicine | Admitting: Family Medicine

## 2023-02-08 ENCOUNTER — Emergency Department (HOSPITAL_COMMUNITY): Payer: Medicare Other

## 2023-02-08 ENCOUNTER — Ambulatory Visit (HOSPITAL_COMMUNITY): Payer: Medicare Other | Admitting: Physical Therapy

## 2023-02-08 DIAGNOSIS — S91302S Unspecified open wound, left foot, sequela: Secondary | ICD-10-CM

## 2023-02-08 DIAGNOSIS — F32A Depression, unspecified: Secondary | ICD-10-CM | POA: Diagnosis present

## 2023-02-08 DIAGNOSIS — M7989 Other specified soft tissue disorders: Secondary | ICD-10-CM | POA: Diagnosis not present

## 2023-02-08 DIAGNOSIS — K219 Gastro-esophageal reflux disease without esophagitis: Secondary | ICD-10-CM | POA: Diagnosis present

## 2023-02-08 DIAGNOSIS — L89322 Pressure ulcer of left buttock, stage 2: Secondary | ICD-10-CM | POA: Diagnosis present

## 2023-02-08 DIAGNOSIS — M1990 Unspecified osteoarthritis, unspecified site: Secondary | ICD-10-CM | POA: Diagnosis present

## 2023-02-08 DIAGNOSIS — E785 Hyperlipidemia, unspecified: Secondary | ICD-10-CM | POA: Diagnosis present

## 2023-02-08 DIAGNOSIS — L03119 Cellulitis of unspecified part of limb: Secondary | ICD-10-CM | POA: Diagnosis present

## 2023-02-08 DIAGNOSIS — E119 Type 2 diabetes mellitus without complications: Secondary | ICD-10-CM

## 2023-02-08 DIAGNOSIS — I9589 Other hypotension: Secondary | ICD-10-CM | POA: Diagnosis present

## 2023-02-08 DIAGNOSIS — A419 Sepsis, unspecified organism: Secondary | ICD-10-CM | POA: Diagnosis not present

## 2023-02-08 DIAGNOSIS — M6289 Other specified disorders of muscle: Secondary | ICD-10-CM | POA: Diagnosis not present

## 2023-02-08 DIAGNOSIS — J4489 Other specified chronic obstructive pulmonary disease: Secondary | ICD-10-CM | POA: Diagnosis present

## 2023-02-08 DIAGNOSIS — L02612 Cutaneous abscess of left foot: Secondary | ICD-10-CM | POA: Diagnosis present

## 2023-02-08 DIAGNOSIS — E872 Acidosis, unspecified: Secondary | ICD-10-CM | POA: Diagnosis present

## 2023-02-08 DIAGNOSIS — Z794 Long term (current) use of insulin: Secondary | ICD-10-CM | POA: Diagnosis not present

## 2023-02-08 DIAGNOSIS — I255 Ischemic cardiomyopathy: Secondary | ICD-10-CM | POA: Diagnosis present

## 2023-02-08 DIAGNOSIS — Z7984 Long term (current) use of oral hypoglycemic drugs: Secondary | ICD-10-CM

## 2023-02-08 DIAGNOSIS — I482 Chronic atrial fibrillation, unspecified: Secondary | ICD-10-CM | POA: Diagnosis present

## 2023-02-08 DIAGNOSIS — D539 Nutritional anemia, unspecified: Secondary | ICD-10-CM | POA: Diagnosis present

## 2023-02-08 DIAGNOSIS — L97529 Non-pressure chronic ulcer of other part of left foot with unspecified severity: Secondary | ICD-10-CM | POA: Diagnosis present

## 2023-02-08 DIAGNOSIS — E1165 Type 2 diabetes mellitus with hyperglycemia: Secondary | ICD-10-CM | POA: Diagnosis present

## 2023-02-08 DIAGNOSIS — I5022 Chronic systolic (congestive) heart failure: Secondary | ICD-10-CM | POA: Diagnosis present

## 2023-02-08 DIAGNOSIS — N179 Acute kidney failure, unspecified: Secondary | ICD-10-CM | POA: Diagnosis present

## 2023-02-08 DIAGNOSIS — Z79899 Other long term (current) drug therapy: Secondary | ICD-10-CM

## 2023-02-08 DIAGNOSIS — Z833 Family history of diabetes mellitus: Secondary | ICD-10-CM

## 2023-02-08 DIAGNOSIS — L0390 Cellulitis, unspecified: Secondary | ICD-10-CM | POA: Diagnosis not present

## 2023-02-08 DIAGNOSIS — L97829 Non-pressure chronic ulcer of other part of left lower leg with unspecified severity: Secondary | ICD-10-CM | POA: Diagnosis present

## 2023-02-08 DIAGNOSIS — I70262 Atherosclerosis of native arteries of extremities with gangrene, left leg: Secondary | ICD-10-CM | POA: Diagnosis present

## 2023-02-08 DIAGNOSIS — L89312 Pressure ulcer of right buttock, stage 2: Secondary | ICD-10-CM | POA: Diagnosis present

## 2023-02-08 DIAGNOSIS — I25810 Atherosclerosis of coronary artery bypass graft(s) without angina pectoris: Secondary | ICD-10-CM | POA: Diagnosis present

## 2023-02-08 DIAGNOSIS — I2510 Atherosclerotic heart disease of native coronary artery without angina pectoris: Secondary | ICD-10-CM | POA: Diagnosis present

## 2023-02-08 DIAGNOSIS — E1122 Type 2 diabetes mellitus with diabetic chronic kidney disease: Secondary | ICD-10-CM | POA: Diagnosis present

## 2023-02-08 DIAGNOSIS — L02619 Cutaneous abscess of unspecified foot: Secondary | ICD-10-CM | POA: Diagnosis present

## 2023-02-08 DIAGNOSIS — N182 Chronic kidney disease, stage 2 (mild): Secondary | ICD-10-CM | POA: Diagnosis present

## 2023-02-08 DIAGNOSIS — Z7901 Long term (current) use of anticoagulants: Secondary | ICD-10-CM

## 2023-02-08 DIAGNOSIS — Z79620 Long term (current) use of immunosuppressive biologic: Secondary | ICD-10-CM

## 2023-02-08 DIAGNOSIS — S91302A Unspecified open wound, left foot, initial encounter: Secondary | ICD-10-CM | POA: Diagnosis not present

## 2023-02-08 DIAGNOSIS — H9190 Unspecified hearing loss, unspecified ear: Secondary | ICD-10-CM | POA: Diagnosis present

## 2023-02-08 DIAGNOSIS — I96 Gangrene, not elsewhere classified: Secondary | ICD-10-CM | POA: Diagnosis not present

## 2023-02-08 DIAGNOSIS — I7090 Unspecified atherosclerosis: Secondary | ICD-10-CM | POA: Diagnosis not present

## 2023-02-08 DIAGNOSIS — Z8249 Family history of ischemic heart disease and other diseases of the circulatory system: Secondary | ICD-10-CM

## 2023-02-08 DIAGNOSIS — Z823 Family history of stroke: Secondary | ICD-10-CM

## 2023-02-08 DIAGNOSIS — Z87891 Personal history of nicotine dependence: Secondary | ICD-10-CM

## 2023-02-08 DIAGNOSIS — Z951 Presence of aortocoronary bypass graft: Secondary | ICD-10-CM

## 2023-02-08 DIAGNOSIS — I10 Essential (primary) hypertension: Secondary | ICD-10-CM | POA: Diagnosis not present

## 2023-02-08 DIAGNOSIS — L03116 Cellulitis of left lower limb: Secondary | ICD-10-CM | POA: Diagnosis not present

## 2023-02-08 DIAGNOSIS — I13 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, or unspecified chronic kidney disease: Secondary | ICD-10-CM | POA: Diagnosis present

## 2023-02-08 DIAGNOSIS — I70245 Atherosclerosis of native arteries of left leg with ulceration of other part of foot: Secondary | ICD-10-CM | POA: Diagnosis not present

## 2023-02-08 DIAGNOSIS — I252 Old myocardial infarction: Secondary | ICD-10-CM

## 2023-02-08 DIAGNOSIS — M7732 Calcaneal spur, left foot: Secondary | ICD-10-CM | POA: Diagnosis not present

## 2023-02-08 DIAGNOSIS — Z800 Family history of malignant neoplasm of digestive organs: Secondary | ICD-10-CM

## 2023-02-08 DIAGNOSIS — M79672 Pain in left foot: Secondary | ICD-10-CM

## 2023-02-08 DIAGNOSIS — Z7952 Long term (current) use of systemic steroids: Secondary | ICD-10-CM

## 2023-02-08 LAB — COMPREHENSIVE METABOLIC PANEL WITH GFR
ALT: 30 U/L (ref 0–44)
AST: 26 U/L (ref 15–41)
Albumin: 3.6 g/dL (ref 3.5–5.0)
Alkaline Phosphatase: 79 U/L (ref 38–126)
Anion gap: 14 (ref 5–15)
BUN: 18 mg/dL (ref 8–23)
CO2: 23 mmol/L (ref 22–32)
Calcium: 9.4 mg/dL (ref 8.9–10.3)
Chloride: 105 mmol/L (ref 98–111)
Creatinine, Ser: 1.56 mg/dL — ABNORMAL HIGH (ref 0.61–1.24)
GFR, Estimated: 44 mL/min — ABNORMAL LOW
Glucose, Bld: 235 mg/dL — ABNORMAL HIGH (ref 70–99)
Potassium: 4.1 mmol/L (ref 3.5–5.1)
Sodium: 142 mmol/L (ref 135–145)
Total Bilirubin: 0.9 mg/dL (ref 0.0–1.2)
Total Protein: 5.7 g/dL — ABNORMAL LOW (ref 6.5–8.1)

## 2023-02-08 LAB — HEMOGLOBIN A1C
Hgb A1c MFr Bld: 6.2 % — ABNORMAL HIGH (ref 4.8–5.6)
Mean Plasma Glucose: 131.24 mg/dL

## 2023-02-08 LAB — CBC WITH DIFFERENTIAL/PLATELET
Abs Immature Granulocytes: 0.45 10*3/uL — ABNORMAL HIGH (ref 0.00–0.07)
Basophils Absolute: 0.1 10*3/uL (ref 0.0–0.1)
Basophils Relative: 0 %
Eosinophils Absolute: 0 10*3/uL (ref 0.0–0.5)
Eosinophils Relative: 0 %
HCT: 40.1 % (ref 39.0–52.0)
Hemoglobin: 12.7 g/dL — ABNORMAL LOW (ref 13.0–17.0)
Immature Granulocytes: 1 %
Lymphocytes Relative: 4 %
Lymphs Abs: 1.1 10*3/uL (ref 0.7–4.0)
MCH: 34.3 pg — ABNORMAL HIGH (ref 26.0–34.0)
MCHC: 31.7 g/dL (ref 30.0–36.0)
MCV: 108.4 fL — ABNORMAL HIGH (ref 80.0–100.0)
Monocytes Absolute: 0.9 10*3/uL (ref 0.1–1.0)
Monocytes Relative: 3 %
Neutro Abs: 28.7 10*3/uL — ABNORMAL HIGH (ref 1.7–7.7)
Neutrophils Relative %: 92 %
Platelets: 152 10*3/uL (ref 150–400)
RBC: 3.7 MIL/uL — ABNORMAL LOW (ref 4.22–5.81)
RDW: 15.3 % (ref 11.5–15.5)
WBC: 31.2 10*3/uL — ABNORMAL HIGH (ref 4.0–10.5)
nRBC: 0.1 % (ref 0.0–0.2)

## 2023-02-08 LAB — LACTIC ACID, PLASMA: Lactic Acid, Venous: 1.4 mmol/L (ref 0.5–1.9)

## 2023-02-08 LAB — I-STAT CG4 LACTIC ACID, ED
Lactic Acid, Venous: 3.2 mmol/L (ref 0.5–1.9)
Lactic Acid, Venous: 2.2 mmol/L (ref 0.5–1.9)

## 2023-02-08 MED ORDER — APIXABAN 2.5 MG PO TABS
2.5000 mg | ORAL_TABLET | Freq: Two times a day (BID) | ORAL | Status: DC
  Administered 2023-02-08 – 2023-02-09 (×2): 2.5 mg via ORAL
  Filled 2023-02-08 (×2): qty 1

## 2023-02-08 MED ORDER — ACETAMINOPHEN 650 MG RE SUPP: 650.0000 mg | Freq: Four times a day (QID) | RECTAL | Status: DC | PRN

## 2023-02-08 MED ORDER — INSULIN ASPART 100 UNIT/ML IJ SOLN: 0.0000 [IU] | Freq: Three times a day (TID) | INTRAMUSCULAR | Status: DC

## 2023-02-08 MED ORDER — SODIUM CHLORIDE 0.9 % IV BOLUS
1000.0000 mL | Freq: Once | INTRAVENOUS | Status: AC
  Administered 2023-02-08: 1000 mL via INTRAVENOUS

## 2023-02-08 MED ORDER — PANTOPRAZOLE SODIUM 40 MG PO TBEC
40.0000 mg | DELAYED_RELEASE_TABLET | Freq: Every day | ORAL | Status: DC
  Administered 2023-02-09 – 2023-02-15 (×7): 40 mg via ORAL
  Filled 2023-02-08 (×6): qty 1

## 2023-02-08 MED ORDER — SODIUM CHLORIDE 0.9 % IV SOLN
1.0000 g | INTRAVENOUS | Status: DC
  Administered 2023-02-09 – 2023-02-13 (×5): 1 g via INTRAVENOUS
  Filled 2023-02-08 (×5): qty 10

## 2023-02-08 MED ORDER — METOPROLOL TARTRATE 100 MG PO TABS
100.0000 mg | ORAL_TABLET | Freq: Two times a day (BID) | ORAL | Status: DC
  Administered 2023-02-08 – 2023-02-09 (×3): 100 mg via ORAL
  Filled 2023-02-08: qty 1
  Filled 2023-02-08 (×2): qty 4

## 2023-02-08 MED ORDER — SIMVASTATIN 20 MG PO TABS
40.0000 mg | ORAL_TABLET | Freq: Every day | ORAL | Status: DC
  Administered 2023-02-08 – 2023-02-14 (×7): 40 mg via ORAL
  Filled 2023-02-08 (×7): qty 2

## 2023-02-08 MED ORDER — SENNOSIDES-DOCUSATE SODIUM 8.6-50 MG PO TABS: 1.0000 | ORAL_TABLET | Freq: Every evening | ORAL | Status: DC | PRN

## 2023-02-08 MED ORDER — VANCOMYCIN HCL 1500 MG/300ML IV SOLN
1500.0000 mg | Freq: Once | INTRAVENOUS | Status: AC
  Administered 2023-02-08: 1500 mg via INTRAVENOUS
  Filled 2023-02-08: qty 300

## 2023-02-08 MED ORDER — OXYCODONE HCL 5 MG PO TABS
5.0000 mg | ORAL_TABLET | Freq: Four times a day (QID) | ORAL | Status: DC | PRN
  Administered 2023-02-14: 5 mg via ORAL
  Filled 2023-02-08: qty 1

## 2023-02-08 MED ORDER — MIRTAZAPINE 15 MG PO TABS
15.0000 mg | ORAL_TABLET | Freq: Every day | ORAL | Status: DC
  Administered 2023-02-08 – 2023-02-14 (×7): 15 mg via ORAL
  Filled 2023-02-08 (×7): qty 1

## 2023-02-08 MED ORDER — ACETAMINOPHEN 325 MG PO TABS
650.0000 mg | ORAL_TABLET | Freq: Four times a day (QID) | ORAL | Status: DC | PRN
  Filled 2023-02-08: qty 2

## 2023-02-08 MED ORDER — AMIODARONE HCL 200 MG PO TABS
200.0000 mg | ORAL_TABLET | Freq: Every day | ORAL | Status: DC
  Administered 2023-02-09 – 2023-02-13 (×5): 200 mg via ORAL
  Filled 2023-02-08 (×5): qty 1

## 2023-02-08 NOTE — H&P (Incomplete)
History and Physical    Patient: Donald Rios NFA:[id number] DOB: [birth year] DOA: 02/08/2023 DOS: the patient was seen and examined on 02/08/2023 PCP: [name], MD  Patient coming from: Home  Chief Complaint:  Chief Complaint  Patient presents with   Leg Swelling   HPI: Donald Rios is a 83 y.o. 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    History of MI (myocardial infarction)    History of nuclear stress test 08/07/2010   dipyridamole; EKG negative for ischemia, low risk scan    Hyperlipidemia    Hyperplastic colon polyp 05/2008   Hypertension    Ischemic cardiomyopathy    EF 45%, with inferior wall motion abnormality    Myocardial infarction (HCC)    Valvular regurgitation    mitral and tricuspid (mild)   Past Surgical History:  Procedure Laterality Date   CARDIOVERSION N/A 08/16/2022   Procedure: CARDIOVERSION;  Surgeon: Jake Bathe, MD;  Location: [facility];  Service: Cardiovascular;  Laterality: N/A;   CORONARY ARTERY BYPASS GRAFT  2000   LIMA to LAD, free IMA to OM2, sequential graft to PLA & PLD   ESOPHAGOGASTRODUODENOSCOPY N/A 05/07/2013   Procedure: ESOPHAGOGASTRODUODENOSCOPY (EGD);  Surgeon: Iva Boop, MD;  Location: [facility] ENDOSCOPY;  Service: Endoscopy;  Laterality: N/A;   TEE WITHOUT CARDIOVERSION N/A 08/16/2022   Procedure: TRANSESOPHAGEAL ECHOCARDIOGRAM;  Surgeon: Jake Bathe, MD;  Location: [facility] INVAS[facility] CV LAB;  Service:  Cardiovascular;  Laterality: N/A;   TRANSTHORACIC ECHOCARDIOGRAM  09/07/2010   EF 45-50%; LV systolic function mildly reduced; LA mildly dilated; mild-mod MR & mild-mod TR; aortic root sclerosis/calcification;    Social History:  reports that he quit smoking about 37 years ago. His smoking use included cigarettes. He has never used smokeless tobacco. He reports that he does not currently use alcohol after a past usage of about 2.0 - 3.0 standard drinks of alcohol per week. He reports that he does not use drugs.  No Known Allergies  Family History  Problem Relation Age of Onset   Stroke Father    Hypertension Father    Diabetes Brother    Esophageal cancer Brother    Stroke Maternal Grandmother    Stroke Maternal Grandfather    Stomach cancer Maternal Aunt    Colon cancer Neg Hx    Rectal cancer Neg Hx     Prior to Admission medications   Medication Sig Start Date End Date Taking? Authorizing Provider  silver sulfADIAZINE (SILVADENE) 1 % cream Apply 1 Application topically daily. 02/02/23  Yes [provider]  allopurinol (ZYLOPRIM) 300 MG tablet TAKE 1 TABLET BY MOUTH DAILY 01/28/23   [name], MD  amiodarone (PACERONE) 200 MG tablet Take 1 tablet by mouth twice daily for 14 days then decrease to 1 tablet once daily. 01/13/23   Hilty, Lisette Abu, MD  amoxicillin-clavulanate (AUGMENTIN)  875-125 MG tablet Take 1 tablet by mouth 2 (two) times daily. 02/04/23   [name], MD  apixaban (ELIQUIS) 5 MG TABS tablet Take 1 tablet (5 mg total) by mouth 2 (two) times daily. 09/20/22   [name], MD  doxazosin (CARDURA) 2 MG tablet TAKE 1 TABLET BY MOUTH DAILY Patient taking differently: Take 2 mg by mouth at bedtime. 08/04/22   [name], MD  empagliflozin (JARDIANCE) 10 MG TABS tablet Take 1 tablet (10 mg total) by mouth daily. 08/16/22   Arty Baumgartner, NP  furosemide (LASIX) 40 MG tablet Take 1 tablet (40 mg total) by mouth 2 (two) times daily. 11/23/22    [name], MD  glipiZIDE (GLIPIZIDE XL) 5 MG 24 hr tablet Take 1 tablet (5 mg total) by mouth daily with breakfast. 12/21/22   [name], MD  HYDROcodone-acetaminophen (NORCO) 5-325 MG tablet Take 1 tablet by mouth every 6 (six) hours as needed for moderate pain (pain score 4-6). 11/23/22   [name], MD  insulin aspart (FIASP FLEXTOUCH) 100 UNIT/ML FlexTouch Pen Before each meal 3 times a day, 140-199 - 2 units, 200-250 - 4 units, 251-299 - 6 units,  300-349 - 8 units,  350 or above 10 units. Patient taking differently: as needed. Before each meal 3 times a day, 140-199 - 2 units, 200-250 - 4 units, 251-299 - 6 units,  300-349 - 8 units,  350 or above 10 units. 08/16/22   Leroy Sea, MD  losartan (COZAAR) 25 MG tablet Take 1 tablet (25 mg total) by mouth daily. 08/16/22   Arty Baumgartner, NP  metoprolol tartrate (LOPRESSOR) 50 MG tablet Take 2 tablets (100 mg total) by mouth 2 (two) times daily. 11/25/22   [name], MD  mirtazapine (REMERON) 30 MG tablet TAKE 1 TABLET BY MOUTH EVERY NIGHT AT BEDTIME FOR APPETITE 09/02/22   [name], MD  mupirocin ointment (BACTROBAN) 2 % SMARTSIG:1 Application Topical 2-3 Times Daily 11/23/22   [provider]  pantoprazole (PROTONIX) 40 MG tablet Take 1 tablet (40 mg total) by mouth daily. 07/30/22   [name], MD  potassium chloride SA (KLOR-CON M) 20 MEQ tablet Take 1 tablet (20 mEq total) by mouth once for 1 dose. 11/11/22 11/11/22  [name], MD  predniSONE (DELTASONE) 5 MG tablet Take 15 mg by mouth daily with breakfast.    [provider]  simvastatin (ZOCOR) 40 MG tablet TAKE ONE TABLET BY MOUTH DAILY Patient taking differently: Take 40 mg by mouth at bedtime. 05/17/22   [name], MD  spironolactone (ALDACTONE) 25 MG tablet Take 1 tablet (25 mg total) by mouth 2 (two) times daily. 11/23/22 02/21/23  [name], MD  Tocilizumab (ACTEMRA IV) Inject 6 mg/kg into the vein  every 28 (twenty-eight) days. Receives at [facility]    Fuller Plan, MD    Physical Exam: Vitals:   02/08/23 1546 02/08/23 1551 02/08/23 2036  BP: 109/61  (!) 112/91  Pulse: (!) 103  93  Resp: 18  17  Temp: 98 F (36.7 C)  (!) 97.5 F (36.4 C)  TempSrc: Oral  Oral  SpO2: 100%  100%  Weight:  66.8 kg   Height:  5\' 8"  (1.727 m)    *** Data Reviewed: {Tip this will not be part of the note when signed- Document your independent interpretation of telemetry tracing, EKG, lab, Radiology test or any  other diagnostic tests. Add any new diagnostic test ordered today. (Optional):26781} {Results:26384}  Assessment and Plan: No notes have been filed under this hospital service. Service: Hospitalist *** Sepsis 2/2 LLE cellulitis -no osteomyelitis noted on x-ray -vancomycin      Advance Care Planning:   Code Status: Prior ***  Consults: ***  Family Communication: ***  Severity of Illness: {Observation/Inpatient:21159}  Author: Briscoe Burns, MD 02/08/2023 9:45 PM  For on call review [email or web address].

## 2023-02-08 NOTE — ED Provider Notes (Signed)
[place] EMERGENCY DEPARTMENT AT [facility] Provider Note   CSN: [id number] Arrival date & time: 02/08/23  1540     History  Chief Complaint  Patient presents with   Leg Swelling    Donald Rios is a 83 y.o. male.  This is an 83 year old male with diabetes presenting emergency department for evaluation for left foot infection.  Reports has had issues with the foot for the past several months has been seen by wound care outpatient.  Has been on several different antibiotics.  He notes for the past several days his toes have been more red and painful, was sent to the emergency department by wound care as his wound has appeared worse to them.  Nuys any fevers at home, chills no chest pain no shortness of breath.  No URI symptoms.        Home Medications Prior to Admission medications   Medication Sig Start Date End Date Taking? Authorizing Provider  silver sulfADIAZINE (SILVADENE) 1 % cream Apply 1 Application topically daily. 02/02/23  Yes [provider]  allopurinol (ZYLOPRIM) 300 MG tablet TAKE 1 TABLET BY MOUTH DAILY 01/28/23   [name], MD  amiodarone (PACERONE) 200 MG tablet Take 1 tablet by mouth twice daily for 14 days then decrease to 1 tablet once daily. 01/13/23   Hilty, Lisette Abu, MD  amoxicillin-clavulanate (AUGMENTIN) 875-125 MG tablet Take 1 tablet by mouth 2 (two) times daily. 02/04/23   [name], MD  apixaban (ELIQUIS) 5 MG TABS tablet Take 1 tablet (5 mg total) by mouth 2 (two) times daily. 09/20/22   [name], MD  doxazosin (CARDURA) 2 MG tablet TAKE 1 TABLET BY MOUTH DAILY Patient taking differently: Take 2 mg by mouth at bedtime. 08/04/22   [name], MD  empagliflozin (JARDIANCE) 10 MG TABS tablet Take 1 tablet (10 mg total) by mouth daily. 08/16/22   Arty Baumgartner, NP  furosemide (LASIX) 40 MG tablet Take 1 tablet (40 mg total) by mouth 2 (two) times daily. 11/23/22   [name], MD  glipiZIDE  (GLIPIZIDE XL) 5 MG 24 hr tablet Take 1 tablet (5 mg total) by mouth daily with breakfast. 12/21/22   [name], MD  HYDROcodone-acetaminophen (NORCO) 5-325 MG tablet Take 1 tablet by mouth every 6 (six) hours as needed for moderate pain (pain score 4-6). 11/23/22   [name], MD  insulin aspart (FIASP FLEXTOUCH) 100 UNIT/ML FlexTouch Pen Before each meal 3 times a day, 140-199 - 2 units, 200-250 - 4 units, 251-299 - 6 units,  300-349 - 8 units,  350 or above 10 units. Patient taking differently: as needed. Before each meal 3 times a day, 140-199 - 2 units, 200-250 - 4 units, 251-299 - 6 units,  300-349 - 8 units,  350 or above 10 units. 08/16/22   Leroy Sea, MD  losartan (COZAAR) 25 MG tablet Take 1 tablet (25 mg total) by mouth daily. 08/16/22   Arty Baumgartner, NP  metoprolol tartrate (LOPRESSOR) 50 MG tablet Take 2 tablets (100 mg total) by mouth 2 (two) times daily. 11/25/22   [name], MD  mirtazapine (REMERON) 30 MG tablet TAKE 1 TABLET BY MOUTH EVERY NIGHT AT BEDTIME FOR APPETITE 09/02/22   [name], MD  mupirocin ointment (BACTROBAN) 2 % SMARTSIG:1 Application Topical 2-3 Times Daily 11/23/22   [provider]  pantoprazole (PROTONIX) 40 MG tablet Take 1 tablet (40 mg total)  by mouth daily. 07/30/22   [name], MD  potassium chloride SA (KLOR-CON M) 20 MEQ tablet Take 1 tablet (20 mEq total) by mouth once for 1 dose. 11/11/22 11/11/22  [name], MD  predniSONE (DELTASONE) 5 MG tablet Take 15 mg by mouth daily with breakfast.    [provider]  simvastatin (ZOCOR) 40 MG tablet TAKE ONE TABLET BY MOUTH DAILY Patient taking differently: Take 40 mg by mouth at bedtime. 05/17/22   [name], MD  spironolactone (ALDACTONE) 25 MG tablet Take 1 tablet (25 mg total) by mouth 2 (two) times daily. 11/23/22 02/21/23  [name], MD  Tocilizumab (ACTEMRA IV) Inject 6 mg/kg into the vein every 28 (twenty-eight) days.  Receives at [facility]    Fuller Plan, MD      Allergies    Patient has no known allergies.    Review of Systems   Review of Systems  Physical Exam Updated Vital Signs BP (!) 112/91   Pulse 93   Temp (!) 97.5 F (36.4 C) (Oral)   Resp 17   Ht 5\' 8"  (1.727 m)   Wt 66.8 kg   SpO2 100%   BMI 22.39 kg/m  Physical Exam Vitals and nursing note reviewed.  Constitutional:      General: He is not in acute distress.    Appearance: He is not toxic-appearing.  HENT:     Head: Normocephalic.     Nose: Nose normal.  Eyes:     Conjunctiva/sclera: Conjunctivae normal.  Cardiovascular:     Rate and Rhythm: Normal rate and regular rhythm.  Pulmonary:     Effort: Pulmonary effort is normal.  Abdominal:     General: Abdomen is flat. There is no distension.     Palpations: Abdomen is soft.     Tenderness: There is no abdominal tenderness. There is no guarding or rebound.  Musculoskeletal:     Comments: Left foot with ulcerative lesion to the dorsal aspect, global swelling, brisk cap refill.  Had some redness and cellulitis to toes.  No purulent drainage from the ulcer lesion.  See image below  Neurological:     Mental Status: He is alert and oriented to person, place, and time.  Psychiatric:        Mood and Affect: Mood normal.        Behavior: Behavior normal.     ED Results / Procedures / Treatments   Labs (all labs ordered are listed, but only abnormal results are displayed) Labs Reviewed  COMPREHENSIVE METABOLIC PANEL - Abnormal; Notable for the following components:      Result Value   Glucose, Bld 235 (*)    Creatinine, Ser 1.56 (*)    Total Protein 5.7 (*)    GFR, Estimated 44 (*)    All other components within normal limits  CBC WITH DIFFERENTIAL/PLATELET - Abnormal; Notable for the following components:   WBC 31.2 (*)    RBC 3.70 (*)    Hemoglobin 12.7 (*)    MCV 108.4 (*)    MCH 34.3 (*)    Neutro Abs 28.7 (*)    Abs Immature  Granulocytes 0.45 (*)    All other components within normal limits  I-STAT CG4 LACTIC ACID, ED - Abnormal; Notable for the following components:   Lactic Acid, Venous 3.2 (*)    All other components within normal limits  I-STAT CG4 LACTIC ACID, ED - Abnormal; Notable for the following components:  Lactic Acid, Venous 2.2 (*)    All other components within normal limits  CULTURE, BLOOD (ROUTINE X 2)  CULTURE, BLOOD (ROUTINE X 2)  URINALYSIS, W/ REFLEX TO CULTURE (INFECTION SUSPECTED)      EKG None  Radiology DG Foot Complete Left Result Date: 02/08/2023 CLINICAL DATA:  Rule out osteomyelitis.  Left foot wound. EXAM: LEFT FOOT - COMPLETE 3+ VIEW COMPARISON:  None Available. FINDINGS: There is a bandage along the dorsal aspect of the foot. Negative for acute fracture or dislocation. No evidence for cortical destruction or periosteal reaction. Spurring involving the calcaneus. Atherosclerotic calcifications in left foot. IMPRESSION: 1. No acute bone abnormality to the left foot. 2. No radiographic evidence for osteomyelitis. 3. Atherosclerotic calcifications. Electronically Signed   By: Richarda Overlie M.D.   On: 02/08/2023 17:11    Procedures Procedures    Medications Ordered in ED Medications  sodium chloride 0.9 % bolus 1,000 mL (has no administration in time range)    ED Course/ Medical Decision Making/ A&P                                 Medical Decision Making This is an 83 year old male with diabetes and chronic ulcerative wound in addition to his other comorbidities presented for evaluation of foot infection.  Heart rate with mild elevation on arrival, no fever, hemodynamically stable.  Physical exam with what appears to be superficial cellulitis.  Does have markers of systemic infection as he does have an elevated lactate, and a new leukocytosis.  No significant metabolic derangements.  Slight elevation in CK which appears to be new compared to prior values.  Given IV fluids,  vancomycin ordered.  Plan to admit patient for further IV antibiotics  Amount and/or Complexity of Data Reviewed Independent Historian:     Details: Wife notes that patient has not seen a podiatrist and only goes to wound care External Data Reviewed:     Details: Per chart review has been on multiple oral antibiotics Labs: ordered. Discussion of management or test interpretation with external provider(s): Discussed with hospitalist  Risk Decision regarding hospitalization.          Final Clinical Impression(s) / ED Diagnoses Final diagnoses:  Cellulitis of left lower extremity    Rx / DC Orders ED Discharge Orders     None         Coral Spikes, DO 02/08/23 2115

## 2023-02-08 NOTE — Hospital Course (Signed)
83 yo   Sepsis 2/2 cellulitis LLE  Ulcer/lesion present for past 2 months -follows wound care -worse for past 5 days

## 2023-02-08 NOTE — H&P (Signed)
History and Physical    Patient: Donald Rios WUJ:[id number] DOB: 1940-06-26 DOA: 02/08/2023 DOS: the patient was seen and examined on 02/08/2023 PCP: [name], MD  Patient coming from: Home  Chief Complaint:  Chief Complaint  Patient presents with   Leg Swelling   HPI: Donald Rios is a 83 y.o. male with medical history significant of chronic left lower extremity wound, hypertension, CKD stage II, atrial fibrillation on Eliquis, chronic systolic heart failure, CAD status post CABG, type 2 diabetes, GERD presenting with left foot infection.  Patient reports that he has had of wound on the dorsum of his left foot for about 2 months now.  He has been following with wound care in the outpatient setting and states that his wound looks much improved compared to 2 months ago.  He has been on several antibiotics as well since that time.  States that over the past 5 days or so his left foot has been more red and painful.  He went to go see wound care earlier today and was advised to come to the ED for further evaluation.  He denies any fevers, chills, nausea, vomiting, chest pain, shortness of breath, abdominal pain, diarrhea, changes in urination.  He denies any drainage from his left foot wound.  ED course: Initially mildly hypotensive, improved with IV fluids.  He is in atrial fibrillation with mild tachycardia noted on telemetry.  CBC notable for leukocytosis to 31.2, chronic macrocytic anemia.  CMP with hyperglycemia to 235, creatinine 1.56 (baseline creatinine around 1-1.15).  Lactic acid 3.2 with improvement to 2.2 on recheck.  Blood cultures collected x 2.  Left foot x-ray with no signs of osteomyelitis.  Review of Systems: As mentioned in the history of present illness. All other systems reviewed and are negative. Past Medical History:  Diagnosis Date   Acute gastric ulcer with hemorrhage 05/06/2013   Arthritis    Asthma    Atrial enlargement, left    CAD (coronary artery  disease) of bypass graft    Cataract    COPD (chronic obstructive pulmonary disease) (HCC)    Diabetes mellitus    Diabetic nephropathy (HCC)    Diverticulosis    ED (erectile dysfunction)    GERD (gastroesophageal reflux disease)    Gout    Hearing loss    Hiatal hernia 05/2008   EGD with HH and reflux esophagitis.    History of MI (myocardial infarction)    History of nuclear stress test 08/07/2010   dipyridamole; EKG negative for ischemia, low risk scan    Hyperlipidemia    Hyperplastic colon polyp 05/2008   Hypertension    Ischemic cardiomyopathy    EF 45%, with inferior wall motion abnormality    Myocardial infarction (HCC)    Valvular regurgitation    mitral and tricuspid (mild)   Past Surgical History:  Procedure Laterality Date   CARDIOVERSION N/A 08/16/2022   Procedure: CARDIOVERSION;  Surgeon: Jake Bathe, MD;  Location: [facility];  Service: Cardiovascular;  Laterality: N/A;   CORONARY ARTERY BYPASS GRAFT  2000   LIMA to LAD, free IMA to OM2, sequential graft to PLA & PLD   ESOPHAGOGASTRODUODENOSCOPY N/A 05/07/2013   Procedure: ESOPHAGOGASTRODUODENOSCOPY (EGD);  Surgeon: Iva Boop, MD;  Location[facility] ENDOSCOPY;  Service: Endoscopy;  Laterality: N/A;   TEE WITHOUT CARDIOVERSION N/A 08/16/2022   Procedure: TRANSESOPHAGEAL ECHOCARDIOGRAM;  Surgeon: Jake Bathe, MD;  Location: [facility] INVASIVE CV LAB;  Service: Cardiovascular;  Laterality: N/A;  TRANSTHORACIC ECHOCARDIOGRAM  09/07/2010   EF 45-50%; LV systolic function mildly reduced; LA mildly dilated; mild-mod MR & mild-mod TR; aortic root sclerosis/calcification;    Social History:  reports that he quit smoking about 37 years ago. His smoking use included cigarettes. He has never used smokeless tobacco. He reports that he does not currently use alcohol after a past usage of about 2.0 - 3.0 standard drinks of alcohol per week. He reports that he does not use drugs.  No Known Allergies  Family History  Problem  Relation Age of Onset   Stroke Father    Hypertension Father    Diabetes Brother    Esophageal cancer Brother    Stroke Maternal Grandmother    Stroke Maternal Grandfather    Stomach cancer Maternal Aunt    Colon cancer Neg Hx    Rectal cancer Neg Hx     Prior to Admission medications   Medication Sig Start Date End Date Taking? Authorizing Provider  allopurinol (ZYLOPRIM) 300 MG tablet TAKE 1 TABLET BY MOUTH DAILY Patient taking differently: Take 300 mg by mouth at bedtime. 01/28/23  Yes [name], MD  amiodarone (PACERONE) 200 MG tablet Take 1 tablet by mouth twice daily for 14 days then decrease to 1 tablet once daily. 01/13/23  Yes Hilty, Lisette Abu, MD  amoxicillin-clavulanate (AUGMENTIN) 875-125 MG tablet Take 1 tablet by mouth 2 (two) times daily. 02/04/23  Yes [name], MD  apixaban (ELIQUIS) 5 MG TABS tablet Take 1 tablet (5 mg total) by mouth 2 (two) times daily. 09/20/22  Yes [name], MD  doxazosin (CARDURA) 2 MG tablet TAKE 1 TABLET BY MOUTH DAILY Patient taking differently: Take 2 mg by mouth at bedtime. 08/04/22  Yes [name], MD  empagliflozin (JARDIANCE) 10 MG TABS tablet Take 1 tablet (10 mg total) by mouth daily. 08/16/22  Yes Arty Baumgartner, NP  furosemide (LASIX) 40 MG tablet Take 1 tablet (40 mg total) by mouth 2 (two) times daily. 11/23/22  Yes [name], MD  glipiZIDE (GLIPIZIDE XL) 5 MG 24 hr tablet Take 1 tablet (5 mg total) by mouth daily with breakfast. 12/21/22  Yes Pickard, Priscille Heidelberg, MD  insulin aspart (FIASP FLEXTOUCH) 100 UNIT/ML FlexTouch Pen Before each meal 3 times a day, 140-199 - 2 units, 200-250 - 4 units, 251-299 - 6 units,  300-349 - 8 units,  350 or above 10 units. Patient taking differently: Inject 3-7 Units into the skin as needed. Before each meal 3 times a day, 140-199 - 2 units, 200-250 - 4 units, 251-299 - 6 units,  300-349 - 8 units,  350 or above 10 units. 08/16/22  Yes Leroy Sea, MD  losartan  (COZAAR) 25 MG tablet Take 1 tablet (25 mg total) by mouth daily. 08/16/22  Yes Laverda Page B, NP  metoprolol tartrate (LOPRESSOR) 50 MG tablet Take 2 tablets (100 mg total) by mouth 2 (two) times daily. 11/25/22  Yes [name], MD  mirtazapine (REMERON) 30 MG tablet TAKE 1 TABLET BY MOUTH EVERY NIGHT AT BEDTIME FOR APPETITE 09/02/22  Yes [name], MD  pantoprazole (PROTONIX) 40 MG tablet Take 1 tablet (40 mg total) by mouth daily. 07/30/22  Yes [name], MD  predniSONE (DELTASONE) 5 MG tablet Take 2.5 mg by mouth daily with breakfast.   Yes [provider]  silver sulfADIAZINE (SILVADENE) 1 % cream Apply 1 Application topically daily. 02/02/23  Yes [provider]  spironolactone (ALDACTONE) 25 MG tablet Take 1 tablet (25 mg total) by mouth 2 (two) times daily. 11/23/22 02/21/23 Yes [name], MD  simvastatin (ZOCOR) 40 MG tablet TAKE ONE TABLET BY MOUTH DAILY Patient taking differently: Take 40 mg by mouth at bedtime. 05/17/22   [name], MD    Physical Exam: Vitals:   02/08/23 1546 02/08/23 1551 02/08/23 2036  BP: 109/61  (!) 112/91  Pulse: (!) 103  93  Resp: 18  17  Temp: 98 F (36.7 C)  (!) 97.5 F (36.4 C)  TempSrc: Oral  Oral  SpO2: 100%  100%  Weight:  66.8 kg   Height:  5\' 8"  (1.727 m)    Physical Exam Constitutional:      Appearance: Normal appearance. He is not ill-appearing.  HENT:     Head: Normocephalic and atraumatic.     Mouth/Throat:     Mouth: Mucous membranes are dry.     Pharynx: Oropharynx is clear. No oropharyngeal exudate.  Eyes:     General: No scleral icterus.    Extraocular Movements: Extraocular movements intact.     Conjunctiva/sclera: Conjunctivae normal.     Pupils: Pupils are equal, round, and reactive to light.  Cardiovascular:     Rate and Rhythm: Tachycardia present. Rhythm irregular.     Pulses: Normal pulses.     Heart sounds: Normal heart sounds. No murmur heard.    No friction  rub. No gallop.  Pulmonary:     Effort: Pulmonary effort is normal.     Breath sounds: Normal breath sounds. No wheezing, rhonchi or rales.  Abdominal:     General: Bowel sounds are normal. There is no distension.     Palpations: Abdomen is soft.     Tenderness: There is no abdominal tenderness. There is no guarding or rebound.  Musculoskeletal:        General: Normal range of motion.     Cervical back: Normal range of motion.     Comments: 1+ LLE pitting edema up to mid-shin.   Skin:    General: Skin is warm and dry.     Comments: Chronic wound noted on dorsum of left foot. No drainage noted. Redness/erythema overlying dorsum of left foot up to ankle. Please see attached photo below.  Neurological:     General: No focal deficit present.     Mental Status: He is alert and oriented to person, place, and time.  Psychiatric:        Mood and Affect: Mood normal.        Behavior: Behavior normal.        Data Reviewed:  There are no new results to review at this time.     Latest Ref Rng & Units 02/08/2023    4:24 PM 12/28/2022    3:36 PM 12/02/2022    3:02 PM  CBC  WBC 4.0 - 10.5 K/uL 31.2  6.7  11.1   Hemoglobin 13.0 - 17.0 g/dL 36.6  44.0  34.7   Hematocrit 39.0 - 52.0 % 40.1  34.4  35.5   Platelets 150 - 400 K/uL 152  113  185.0       Latest Ref Rng & Units 02/08/2023    4:24 PM 12/31/2022    3:41 PM 12/28/2022    3:36 PM  CMP  Glucose 70 - 99 mg/dL 425  956  387   BUN 8 - 23 mg/dL 18  16  18    Creatinine 0.61 - 1.24  mg/dL 4.09  8.11  9.14   Sodium 135 - 145 mmol/L 142  142  140   Potassium 3.5 - 5.1 mmol/L 4.1  5.1  4.7   Chloride 98 - 111 mmol/L 105  110  109   CO2 22 - 32 mmol/L 23  26  21    Calcium 8.9 - 10.3 mg/dL 9.4  8.7  8.9   Total Protein 6.5 - 8.1 g/dL 5.7   5.2   Total Bilirubin 0.0 - 1.2 mg/dL 0.9   1.5   Alkaline Phos 38 - 126 U/L 79     AST 15 - 41 U/L 26   15   ALT 0 - 44 U/L 30   16    DG Foot Complete Left Result Date: 02/08/2023 CLINICAL  DATA:  Rule out osteomyelitis.  Left foot wound. EXAM: LEFT FOOT - COMPLETE 3+ VIEW COMPARISON:  None Available. FINDINGS: There is a bandage along the dorsal aspect of the foot. Negative for acute fracture or dislocation. No evidence for cortical destruction or periosteal reaction. Spurring involving the calcaneus. Atherosclerotic calcifications in left foot. IMPRESSION: 1. No acute bone abnormality to the left foot. 2. No radiographic evidence for osteomyelitis. 3. Atherosclerotic calcifications. Electronically Signed   By: Richarda Overlie M.D.   On: 02/08/2023 17:11     Assessment and Plan: No notes have been filed under this hospital service. Service: Hospitalist  Sepsis 2/2 LLE cellulitis Chronic LLE wound Patient with chronic wound on dorsum of left foot for which she has been following wound care for the past 2 months.  He is presenting with cellulitis of left foot.  Vital signs in the ED did reveal mild hypotension, tachycardia, intermittent tachypnea with labs showing leukocytosis to 31.2, elevated lactate to 3.2, and AKI, meeting sepsis criteria.  He did receive 1 L fluid bolus in the ED with improvement in his blood pressure.  He does have an LVEF of 25% so we will be cautious with additional fluids.  1 dose of IV vancomycin given in the ED, but will switch to IV Rocephin given this is nonpurulent cellulitis.  Lactic acid did improve from 3.2-2.2, will continue to trend until clears.  Given left lower extremity edema, will obtain venous Dopplers to rule out DVT though this is more likely from cellulitis. -IV Rocephin -Wound care consulted -Tylenol every 6 hours as needed for mild pain, oxycodone 5 mg every 6 hours as needed for moderate and severe pain -Follow-up blood cultures x 2 -Trend lactate until clears -Follow-up venous Dopplers -Carb modified diet -Place in observation -Home Eliquis dose reduced to 2.5 mg twice daily (age greater than 85 and presenting with AKI with Cr greater than  1.5)  Hypotension Hx of HTN Patient presented with mild hypotension, improving with IV fluid bolus given in ED.  He is on losartan 25 mg daily, Jardiance 10 mg daily, spironolactone 25 mg twice daily, metoprolol 100 mg twice daily, Lasix 40 mg twice daily at home.  I am currently holding Jardiance, spironolactone, losartan in the setting of AKI and holding Lasix given need for volume resuscitation. -Status post 1 L IV fluid bolus in the ED -Will avoid further fluid resuscitation unless if becoming hypotensive again given LVEF 25% -Goal MAP greater than 65  AKI on CKD 2 Patient with baseline creatinine around 1-1.15.  On admission creatinine 1.56.  This is likely prerenal in the setting of sepsis and decreased p.o. intake as noted above.  He has received 1 L IV  fluids in the ED.  Will be cautious with additional IV fluid rehydration given poor LV function. -Status post 1 L IV fluids -Trend kidney function -Monitor urine output, strict I/O's -Avoid nephrotoxic medications as able  Atrial fibrillation -eliquis decreased to 2.5mg  BID (age >75 and presenting with AKI with Cr >1.5) -resume home metoprolol 100mg  BID -resume home amiodarone 200mg  daily  Chronic systolic HF Has LLE edema on exam, likely from cellulitis in same extremity. ECHO in 11/2022 showing LVEF 25% with severely decreased LV function, global hypokinesis, moderately reduced RV function, moderately dilated LA, moderate mitral valve regurgitation. Not overtly volume overloaded on exam.  -holding home lasix 40mg  BID given need for volume resuscitation in the setting of sepsis as noted above -holding home losartan 25mg  daily, jardiance 10mg  daily, and spironolactone 25mg  BID given AKI -resumed home metoprolol as noted above  CAD s/p CABG -resumed home eliquis -resume home zocor 40mg  qhs  T2DM with hyperglycemia -Home regimen includes sliding scale insulin and glipizide 5 mg daily -Random blood glucose 235 -Follow-up  hemoglobin A1c -Moderate SSI -Trend CBGs, goal 140-180 -Holding home glipizide  Chronic macrocytic anemia -Hemoglobin stable and slightly improved from prior -Follow-up folate and B12 levels -No reported melena or hematochezia  GERD -resume home protonix 40mg  daily  Depression -resume home mirtazapine 15mg  qhs    Advance Care Planning:   Code Status: Full Code   Consults: none  Family Communication: updated wife at bedside  Severity of Illness: The appropriate patient status for this patient is OBSERVATION. Observation status is judged to be reasonable and necessary in order to provide the required intensity of service to ensure the patient's safety. The patient's presenting symptoms, physical exam findings, and initial radiographic and laboratory data in the context of their medical condition is felt to place them at decreased risk for further clinical deterioration. Furthermore, it is anticipated that the patient will be medically stable for discharge from the hospital within 2 midnights of admission.   Portions of this note were generated with Scientist, clinical (histocompatibility and immunogenetics). Dictation errors may occur despite best attempts at proofreading.   Author: Briscoe Burns, MD 02/08/2023 10:09 PM  For on call review [email or web address].

## 2023-02-08 NOTE — Therapy (Signed)
OUTPATIENT PHYSICAL THERAPY Wound  Treatment    Patient Name: Donald Rios MRN: [id number] DOB:05/20/1940, 83 y.o., male Today's Date: 02/08/2023   PCP: Lynnea Ferrier REFERRING PROVIDER: Lynnea Ferrier  END OF SESSION:  PT End of Session - 02/08/23 1337     Visit Number 3    Number of Visits 12    Date for PT Re-Evaluation 03/15/23    Authorization Type medicare/[employer]    Progress Note Due on Visit 10    PT Start Time 1303    PT Stop Time 1335    PT Time Calculation (min) 32 min    Activity Tolerance Patient tolerated treatment well    Behavior During Therapy [facility] for tasks assessed/performed             Past Medical History:  Diagnosis Date   Acute gastric ulcer with hemorrhage 05/06/2013   Arthritis    Asthma    Atrial enlargement, left    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    History of MI (myocardial infarction)    History of nuclear stress test 08/07/2010   dipyridamole; EKG negative for ischemia, low risk scan    Hyperlipidemia    Hyperplastic colon polyp 05/2008   Hypertension    Ischemic cardiomyopathy    EF 45%, with inferior wall motion abnormality    Myocardial infarction (HCC)    Valvular regurgitation    mitral and tricuspid (mild)   Past Surgical History:  Procedure Laterality Date   CARDIOVERSION N/A 08/16/2022   Procedure: CARDIOVERSION;  Surgeon: Jake Bathe, MD;  Location: [facility] INVASIVE CV LAB;  Service: Cardiovascular;  Laterality: N/A;   CORONARY ARTERY BYPASS GRAFT  2000   LIMA to LAD, free IMA to OM2, sequential graft to PLA & PLD   ESOPHAGOGASTRODUODENOSCOPY N/A 05/07/2013   Procedure: ESOPHAGOGASTRODUODENOSCOPY (EGD);  Surgeon: Iva Boop, MD;  Location: [facility] ENDOSCOPY;  Service: Endoscopy;  Laterality: N/A;   TEE WITHOUT CARDIOVERSION N/A 08/16/2022   Procedure: TRANSESOPHAGEAL ECHOCARDIOGRAM;  Surgeon: Jake Bathe, MD;  Location: [facility] INVASIVE CV LAB;  Service: Cardiovascular;  Laterality: N/A;   TRANSTHORACIC ECHOCARDIOGRAM  09/07/2010   EF 45-50%; LV systolic function mildly reduced; LA mildly dilated; mild-mod MR & mild-mod TR; aortic root sclerosis/calcification;    Patient Active Problem List   Diagnosis Date Noted   High risk medication use 10/25/2022   Screening for tuberculosis 10/25/2022   New onset atrial fibrillation (HCC) 08/15/2022   Cardiomyopathy (HCC) 08/15/2022   Temporal arteritis (HCC) 08/13/2022   Anemia 08/04/2022   Protein-calorie malnutrition (HCC) 11/21/2018   Hx of CABG 08/30/2017   Cardiomyopathy, ischemic 12/23/2015   Chronic systolic congestive heart failure, NYHA class 1 (HCC) 12/23/2015   Acute gastric ulcer with hemorrhage 05/06/2013   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    Mild tricuspid regurgitation    Mitral regurgitation    Atrial enlargement, left    Hearing loss    Diverticulosis    Hiatal hernia    Gout    DM2 (diabetes mellitus, type 2) (HCC)    GERD (gastroesophageal reflux disease)  Mixed hyperlipidemia    Essential hypertension     ONSET DATE: 11/16/23  REFERRING DIAG: A54.098 (ICD-10-CM) - Ulcerated, foot, left, with fat layer exposed (HCC) L03.116 (ICD-10-CM) - Cellulitis of left lower extremity  THERAPY DIAG:  Open wound of left foot with complication, sequela  Pain in left foot  Rationale for Evaluation and Treatment: Rehabilitation     Wound Therapy - 02/08/23 0001     Subjective pt states his foot is still huring and feels like he should go get some stronger meds.  The antibiotics he started on Friday have not helped at all.    Patient and Family Stated Goals infection to go away wound to close    Date of Onset 11/16/22    Prior  Treatments antibiotics, self dressing, MD dressing    Pain Scale 0-10    Pain Score 8     Pain Type Chronic pain    Pain Location Foot    Pain Orientation Left    Pain Descriptors / Indicators Aching;Burning    Pain Onset Awakened from sleep    Patients Stated Pain Goal 0    Evaluation and Treatment Procedures Explained to Patient/Family Yes    Evaluation and Treatment Procedures agreed to    Wound Properties Date First Assessed: 02/01/23 Time First Assessed: 1310 Wound Type: Diabetic ulcer Location: Foot Location Orientation: Anterior;Left Wound Description (Comments): dorsal aspect Present on Admission: Yes   Wound Image Images linked: 2    Dressing Type Gauze (Comment)    Dressing Changed Changed    Dressing Status Clean    Dressing Change Frequency PRN    Site / Wound Assessment Granulation tissue;Painful;Yellow    % Wound base Red or Granulating 90%    % Wound base Yellow/Fibrinous Exudate 10%    Peri-wound Assessment Edema;Erythema (blanchable)    Drainage Amount Moderate    Drainage Description Serosanguineous;Purulent    Treatment Cleansed;Debridement (Selective)    Wound Therapy - Clinical Statement see below    Wound Therapy - Functional Problem List difficulty with bathing, dressing and walking    Factors Delaying/Impairing Wound Healing Altered sensation;Diabetes Mellitus;Infection - systemic/local;Immobility    Hydrotherapy Plan Debridement;Dressing change;Patient/family education    Wound Therapy - Frequency 2X / week   for 6 weeks   Wound Therapy - Current Recommendations PT;Other (comment)   culture   Wound Plan cleans, moisturize debride and compression for great toe and foot.    Dressing  Honey impregnated 2x2 with tip packed into hole located on the lateral aspect of the wound.  Vaseline to foot, kerlix coban and netting .               PATIENT EDUCATION: Education details: ankle pumps, do not get dressing wet, remove if painful Person educated:  Patient Education method: Explanation, Demonstration, and Verbal cues Education comprehension: verbalized understanding and returned demonstration   HOME EXERCISE PROGRAM: Ankle pumps    GOALS: Goals reviewed with patient? No  SHORT TERM GOALS: Target date: 03/05/23  Pt wound to be 100% granulated  Baseline: Goal status: IN PROGRESS  2.  PT to be completing ankle pumps 3 x a day to improve DF as well as increase blood flow to wound  Baseline:  Goal status: IN PROGRESS  3.  Big toe to no longer be red Baseline:  Goal status: IN PROGRESS    LONG TERM GOALS: Target date: 03/22/23  PT wound to be healed  Baseline:  Goal status: IN PROGRESS  2.  PT  to have no pain in Lt foot to be able to walk regular for up to 40 minutes Baseline:  Goal status: IN PROGRESS   ASSESSMENT:  CLINICAL IMPRESSION:   Pt returns today with continued redness, edema and pain in Lt foot.  Noted today redness has esceeded outside of lines with purplish coloring along arch of foot.  In addition, there is noted heat in the area with pt reporting increasing pain and inability to sleep or walk.  Pt reported he wanted something stronger to rid his infection before he looses his foot and suggested going to ED.  Therapist agrees that this would be best in light of condition of foot at this point.  He has been taking antibiotic for 5 days now without improvement also.   Cleansed wound and foot, applied lotion and dressing with use of medihoney to open area on dorsal foot.   OBJECTIVE IMPAIRMENTS: increased edema, pain, and decrease skin integrity  .   ACTIVITY LIMITATIONS: toileting, dressing, and locomotion level  PARTICIPATION LIMITATIONS: shopping, community activity, and yard work  PERSONAL FACTORS: Age, Time since onset of injury/illness/exacerbation, and 1 comorbidity: DM  are also affecting patient's functional outcome.   REHAB POTENTIAL: Good  CLINICAL DECISION MAKING: Evolving/moderate  complexity  EVALUATION COMPLEXITY: Moderate  PLAN: PT FREQUENCY: 2x/week  PT DURATION: 6 weeks  PLANNED INTERVENTIONS: 97535- Self Care, 38756- Manual therapy, 97597- Wound care (first 20 sq cm),  PLAN FOR NEXT SESSION: follow up with urgent care received; monitor for changes and apply appropriate dressing.     Lurena Nida, PTA/CLT [facility] Ph: [phone]  02/08/2023, 1:41 PM

## 2023-02-08 NOTE — ED Triage Notes (Signed)
PT sent from wound care for evaluation of infected wound on left foot. Pt states swelling and draining has increased. Wound has been present for 3 months.

## 2023-02-08 NOTE — Progress Notes (Signed)
ED Pharmacy Antibiotic Sign Off An antibiotic consult was received from an ED provider for vancomycin per pharmacy dosing for cellulitis. A chart review was completed to assess appropriateness.   The following one time order(s) were placed:  Vancomycin 1500mg  IV x1  Further antibiotic and/or antibiotic pharmacy consults should be ordered by the admitting provider if indicated.   Thank you for allowing pharmacy to be a part of this patient's care.   Jenita Seashore, [facility]  Clinical Pharmacist 02/08/23 9:22 PM

## 2023-02-08 NOTE — ED Provider Triage Note (Signed)
Emergency Medicine Provider Triage Evaluation Note  Donald Rios , a 83 y.o. male  was evaluated in triage.  Pt complains of left foot infection.  Reports recurrent foot infection for the past several months.  He follows up with wound care.  He was sent here from wound care today due to possible worsening infection.  Review of Systems  Positive: Left foot pain, swelling, and erythema Negative: Right foot pain  Physical Exam  BP 109/61 (BP Location: Left Arm)   Pulse (!) 103   Temp 98 F (36.7 C) (Oral)   Resp 18   Ht 5\' 8"  (1.727 m)   Wt 66.8 kg   SpO2 100%   BMI 22.39 kg/m  Gen:   Awake, no distress   Resp:  Normal effort  MSK:   Moves extremities without difficulty  Other:  Swelling and erythema to left foot  Medical Decision Making  Medically screening exam initiated at 4:03 PM.  Appropriate orders placed.  [name] was informed that the remainder of the evaluation will be completed by another provider, this initial triage assessment does not replace that evaluation, and the importance of remaining in the ED until their evaluation is complete.    Maxwell Marion, PA-C 02/08/23 [phone]

## 2023-02-09 ENCOUNTER — Observation Stay (HOSPITAL_COMMUNITY): Payer: Medicare Other

## 2023-02-09 ENCOUNTER — Encounter (HOSPITAL_COMMUNITY): Payer: Self-pay | Admitting: Student

## 2023-02-09 DIAGNOSIS — L89312 Pressure ulcer of right buttock, stage 2: Secondary | ICD-10-CM | POA: Diagnosis present

## 2023-02-09 DIAGNOSIS — E872 Acidosis, unspecified: Secondary | ICD-10-CM | POA: Diagnosis present

## 2023-02-09 DIAGNOSIS — I13 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, or unspecified chronic kidney disease: Secondary | ICD-10-CM | POA: Diagnosis present

## 2023-02-09 DIAGNOSIS — I7090 Unspecified atherosclerosis: Secondary | ICD-10-CM | POA: Diagnosis not present

## 2023-02-09 DIAGNOSIS — M6289 Other specified disorders of muscle: Secondary | ICD-10-CM | POA: Diagnosis not present

## 2023-02-09 DIAGNOSIS — I2510 Atherosclerotic heart disease of native coronary artery without angina pectoris: Secondary | ICD-10-CM | POA: Diagnosis present

## 2023-02-09 DIAGNOSIS — J4489 Other specified chronic obstructive pulmonary disease: Secondary | ICD-10-CM | POA: Diagnosis present

## 2023-02-09 DIAGNOSIS — I25810 Atherosclerosis of coronary artery bypass graft(s) without angina pectoris: Secondary | ICD-10-CM | POA: Diagnosis present

## 2023-02-09 DIAGNOSIS — K219 Gastro-esophageal reflux disease without esophagitis: Secondary | ICD-10-CM | POA: Diagnosis present

## 2023-02-09 DIAGNOSIS — L97529 Non-pressure chronic ulcer of other part of left foot with unspecified severity: Secondary | ICD-10-CM | POA: Diagnosis present

## 2023-02-09 DIAGNOSIS — I5022 Chronic systolic (congestive) heart failure: Secondary | ICD-10-CM | POA: Diagnosis present

## 2023-02-09 DIAGNOSIS — D539 Nutritional anemia, unspecified: Secondary | ICD-10-CM | POA: Diagnosis present

## 2023-02-09 DIAGNOSIS — Z794 Long term (current) use of insulin: Secondary | ICD-10-CM | POA: Diagnosis not present

## 2023-02-09 DIAGNOSIS — L02612 Cutaneous abscess of left foot: Secondary | ICD-10-CM | POA: Diagnosis present

## 2023-02-09 DIAGNOSIS — L02619 Cutaneous abscess of unspecified foot: Secondary | ICD-10-CM | POA: Diagnosis present

## 2023-02-09 DIAGNOSIS — E785 Hyperlipidemia, unspecified: Secondary | ICD-10-CM | POA: Diagnosis present

## 2023-02-09 DIAGNOSIS — E1165 Type 2 diabetes mellitus with hyperglycemia: Secondary | ICD-10-CM | POA: Diagnosis present

## 2023-02-09 DIAGNOSIS — M7989 Other specified soft tissue disorders: Secondary | ICD-10-CM

## 2023-02-09 DIAGNOSIS — E1122 Type 2 diabetes mellitus with diabetic chronic kidney disease: Secondary | ICD-10-CM | POA: Diagnosis present

## 2023-02-09 DIAGNOSIS — L89322 Pressure ulcer of left buttock, stage 2: Secondary | ICD-10-CM | POA: Diagnosis present

## 2023-02-09 DIAGNOSIS — I96 Gangrene, not elsewhere classified: Secondary | ICD-10-CM | POA: Diagnosis not present

## 2023-02-09 DIAGNOSIS — L03116 Cellulitis of left lower limb: Secondary | ICD-10-CM | POA: Diagnosis present

## 2023-02-09 DIAGNOSIS — I70262 Atherosclerosis of native arteries of extremities with gangrene, left leg: Secondary | ICD-10-CM | POA: Diagnosis present

## 2023-02-09 DIAGNOSIS — N179 Acute kidney failure, unspecified: Secondary | ICD-10-CM | POA: Diagnosis present

## 2023-02-09 DIAGNOSIS — F32A Depression, unspecified: Secondary | ICD-10-CM | POA: Diagnosis present

## 2023-02-09 DIAGNOSIS — I255 Ischemic cardiomyopathy: Secondary | ICD-10-CM | POA: Diagnosis present

## 2023-02-09 DIAGNOSIS — I482 Chronic atrial fibrillation, unspecified: Secondary | ICD-10-CM | POA: Diagnosis present

## 2023-02-09 DIAGNOSIS — L03119 Cellulitis of unspecified part of limb: Secondary | ICD-10-CM | POA: Diagnosis present

## 2023-02-09 DIAGNOSIS — I70245 Atherosclerosis of native arteries of left leg with ulceration of other part of foot: Secondary | ICD-10-CM | POA: Diagnosis not present

## 2023-02-09 DIAGNOSIS — A419 Sepsis, unspecified organism: Secondary | ICD-10-CM | POA: Diagnosis present

## 2023-02-09 DIAGNOSIS — L97829 Non-pressure chronic ulcer of other part of left lower leg with unspecified severity: Secondary | ICD-10-CM | POA: Diagnosis present

## 2023-02-09 DIAGNOSIS — L0390 Cellulitis, unspecified: Secondary | ICD-10-CM | POA: Diagnosis not present

## 2023-02-09 LAB — CBG MONITORING, ED
Glucose-Capillary: 56 mg/dL — ABNORMAL LOW (ref 70–99)
Glucose-Capillary: 78 mg/dL (ref 70–99)
Glucose-Capillary: 103 mg/dL — ABNORMAL HIGH (ref 70–99)

## 2023-02-09 LAB — BASIC METABOLIC PANEL
Anion gap: 9 (ref 5–15)
BUN: 14 mg/dL (ref 8–23)
CO2: 25 mmol/L (ref 22–32)
Calcium: 8.8 mg/dL — ABNORMAL LOW (ref 8.9–10.3)
Chloride: 106 mmol/L (ref 98–111)
Creatinine, Ser: 1.15 mg/dL (ref 0.61–1.24)
GFR, Estimated: >60 mL/min (ref >60)
Glucose, Bld: 87 mg/dL (ref 70–99)
Potassium: 3.5 mmol/L (ref 3.5–5.1)
Sodium: 140 mmol/L (ref 135–145)

## 2023-02-09 LAB — URINALYSIS, W/ REFLEX TO CULTURE (INFECTION SUSPECTED)
Bilirubin Urine: NEGATIVE
Glucose, UA: NEGATIVE mg/dL
Hgb urine dipstick: NEGATIVE
Ketones, ur: NEGATIVE mg/dL
Leukocytes,Ua: NEGATIVE
Nitrite: NEGATIVE
Protein, ur: NEGATIVE mg/dL
Specific Gravity, Urine: 1.018 (ref 1.005–1.030)
pH: 5 (ref 5.0–8.0)

## 2023-02-09 LAB — GLUCOSE, CAPILLARY: Glucose-Capillary: 123 mg/dL — ABNORMAL HIGH (ref 70–99)

## 2023-02-09 LAB — CBC
HCT: 35 % — ABNORMAL LOW (ref 39.0–52.0)
Hemoglobin: 11.2 g/dL — ABNORMAL LOW (ref 13.0–17.0)
MCH: 34.4 pg — ABNORMAL HIGH (ref 26.0–34.0)
MCHC: 32 g/dL (ref 30.0–36.0)
MCV: 107.4 fL — ABNORMAL HIGH (ref 80.0–100.0)
Platelets: 149 10*3/uL — ABNORMAL LOW (ref 150–400)
RBC: 3.26 MIL/uL — ABNORMAL LOW (ref 4.22–5.81)
RDW: 15.4 % (ref 11.5–15.5)
WBC: 28 10*3/uL — ABNORMAL HIGH (ref 4.0–10.5)
nRBC: 0.2 % (ref 0.0–0.2)

## 2023-02-09 LAB — VITAMIN B12: Vitamin B-12: 399 pg/mL (ref 180–914)

## 2023-02-09 LAB — FOLATE: Folate: 13.3 ng/mL (ref >5.9)

## 2023-02-09 MED ORDER — PREDNISONE 5 MG PO TABS: 2.5000 mg | ORAL_TABLET | Freq: Every day | ORAL | Status: DC

## 2023-02-09 MED ORDER — VANCOMYCIN HCL IN DEXTROSE 1-5 GM/200ML-% IV SOLN
1000.0000 mg | INTRAVENOUS | Status: DC
  Administered 2023-02-09 – 2023-02-14 (×6): 1000 mg via INTRAVENOUS
  Filled 2023-02-09 (×6): qty 200

## 2023-02-09 MED ORDER — APIXABAN 5 MG PO TABS
5.0000 mg | ORAL_TABLET | Freq: Two times a day (BID) | ORAL | Status: DC
  Administered 2023-02-09 – 2023-02-10 (×3): 5 mg via ORAL
  Filled 2023-02-09 (×3): qty 1

## 2023-02-09 MED ORDER — INSULIN ASPART 100 UNIT/ML IJ SOLN
0.0000 [IU] | Freq: Every day | INTRAMUSCULAR | Status: DC
  Administered 2023-02-10: 3 [IU] via SUBCUTANEOUS
  Administered 2023-02-11: 4 [IU] via SUBCUTANEOUS
  Administered 2023-02-12: 3 [IU] via SUBCUTANEOUS

## 2023-02-09 MED ORDER — INSULIN ASPART 100 UNIT/ML IJ SOLN
0.0000 [IU] | Freq: Three times a day (TID) | INTRAMUSCULAR | Status: DC
  Administered 2023-02-10: 1 [IU] via SUBCUTANEOUS
  Administered 2023-02-10 – 2023-02-11 (×2): 3 [IU] via SUBCUTANEOUS
  Administered 2023-02-11: 1 [IU] via SUBCUTANEOUS
  Administered 2023-02-12: 4 [IU] via SUBCUTANEOUS
  Administered 2023-02-12 – 2023-02-13 (×2): 1 [IU] via SUBCUTANEOUS
  Administered 2023-02-13 (×2): 2 [IU] via SUBCUTANEOUS
  Administered 2023-02-14: 1 [IU] via SUBCUTANEOUS

## 2023-02-09 MED ORDER — DOXAZOSIN MESYLATE 2 MG PO TABS
2.0000 mg | ORAL_TABLET | Freq: Every day | ORAL | Status: DC
  Administered 2023-02-09 – 2023-02-10 (×2): 2 mg via ORAL
  Filled 2023-02-09 (×2): qty 1

## 2023-02-09 MED ORDER — ALLOPURINOL 300 MG PO TABS
300.0000 mg | ORAL_TABLET | Freq: Every day | ORAL | Status: DC
  Administered 2023-02-09 – 2023-02-14 (×6): 300 mg via ORAL
  Filled 2023-02-09 (×6): qty 1

## 2023-02-09 NOTE — Plan of Care (Signed)
  Problem: Education: Goal: Ability to describe self-care measures that may prevent or decrease complications (Diabetes Survival Skills Education) will improve Outcome: Progressing Goal: Individualized Educational Video(s) Outcome: Progressing   Problem: Coping: Goal: Ability to adjust to condition or change in health will improve Outcome: Progressing   Problem: Fluid Volume: Goal: Ability to maintain a balanced intake and output will improve Outcome: Progressing   Problem: Health Behavior/Discharge Planning: Goal: Ability to identify and utilize available resources and services will improve Outcome: Progressing Goal: Ability to manage health-related needs will improve Outcome: Progressing   Problem: Metabolic: Goal: Ability to maintain appropriate glucose levels will improve Outcome: Progressing   Problem: Nutritional: Goal: Maintenance of adequate nutrition will improve Outcome: Progressing Goal: Progress toward achieving an optimal weight will improve Outcome: Progressing   Problem: Skin Integrity: Goal: Risk for impaired skin integrity will decrease Outcome: Progressing   Problem: Tissue Perfusion: Goal: Adequacy of tissue perfusion will improve Outcome: Progressing   Problem: Education: Goal: Knowledge of General Education information will improve Description: Including pain rating scale, medication(s)/side effects and non-pharmacologic comfort measures Outcome: Progressing   Problem: Health Behavior/Discharge Planning: Goal: Ability to manage health-related needs will improve Outcome: Progressing   Problem: Clinical Measurements: Goal: Ability to maintain clinical measurements within normal limits will improve Outcome: Progressing Goal: Will remain free from infection Outcome: Progressing Goal: Diagnostic test results will improve Outcome: Progressing Goal: Respiratory complications will improve Outcome: Progressing Goal: Cardiovascular complication will  be avoided Outcome: Progressing   Problem: Activity: Goal: Risk for activity intolerance will decrease Outcome: Progressing   Problem: Nutrition: Goal: Adequate nutrition will be maintained Outcome: Progressing   Problem: Coping: Goal: Level of anxiety will decrease Outcome: Progressing   Problem: Elimination: Goal: Will not experience complications related to bowel motility Outcome: Progressing Goal: Will not experience complications related to urinary retention Outcome: Progressing   Problem: Pain Managment: Goal: General experience of comfort will improve and/or be controlled Outcome: Progressing   Problem: Safety: Goal: Ability to remain free from injury will improve Outcome: Progressing   Problem: Skin Integrity: Goal: Risk for impaired skin integrity will decrease Outcome: Progressing   Problem: Clinical Measurements: Goal: Ability to avoid or minimize complications of infection will improve Outcome: Progressing   Problem: Skin Integrity: Goal: Skin integrity will improve Outcome: Progressing   Problem: Education: Goal: Knowledge of disease or condition will improve Outcome: Progressing Goal: Understanding of medication regimen will improve Outcome: Progressing Goal: Individualized Educational Video(s) Outcome: Progressing   Problem: Activity: Goal: Ability to tolerate increased activity will improve Outcome: Progressing   Problem: Cardiac: Goal: Ability to achieve and maintain adequate cardiopulmonary perfusion will improve Outcome: Progressing   Problem: Health Behavior/Discharge Planning: Goal: Ability to safely manage health-related needs after discharge will improve Outcome: Progressing

## 2023-02-09 NOTE — ED Notes (Signed)
Wound care performed as per wound care recommendations/order.

## 2023-02-09 NOTE — Progress Notes (Signed)
LLE venous duplex has been completed.   Results can be found under chart review under CV PROC. 02/09/2023 9:00 AM Dock Baccam RVT, RDMS

## 2023-02-09 NOTE — Progress Notes (Signed)
PROGRESS NOTE    Donald Rios  ZOX:[id number] DOB: 1940/01/30 DOA: 02/08/2023 PCP: Donald Brooks, MD    Brief Narrative:  Patient is a 83 year old gentleman with history of chronic A-fib on Eliquis, chronic systolic heart failure, coronary artery disease, type 2 diabetes on glipizide, GERD.  Patient recently developed temporal arteritis and was on high-dose steroids and currently on tapering his steroids.  While on high-dose steroids he started developing wound on his dorsum of the left foot about 2 months ago.  Followed by wound care, multiple rounds of antibiotics.  Recently treated with Augmentin.  Continue to have more pain, redness so sent to ER from wound care center.  At the emergency room initially hypotensive improved with IV fluids.  He was in A-fib.  Leukocyte count 31,000.  Blood sugar 235.  Creatinine 1.56, lactic acid 3.2.  Blood cultures were collected.  Started on broad-spectrum antibiotics.  Subjective: Patient seen in the morning rounds.  Still in the emergency room.  Has mild pain when he is resting.  Denies any other complaints.  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 Agree with admission given severity of symptoms.  Failed outpatient therapies. Patient currently on Rocephin, with severe infection, will add vancomycin.  Blood cultures are drawn and pending. Sepsis physiology improving.  Lactic acid is normalizing.  Adequate pain medications. Venous Dopplers without DVT. MRI 12/17 with no evidence of osteomyelitis. Local wound care. Patient has adequate vasculature.  Acute kidney injury: Resuscitated.  Improved.  Chronic A-fib: Rate controlled now.  Eliquis dose was reduced to half last night.  He will go back on regular doses.  Chronic systolic heart failure: Currently euvolemic.  He was given IV fluids overnight.  Known ejection fraction of 25%.  Holding  losartan and Jardiance.  Will resume spironolactone and metoprolol today. Coronary artery disease status post CABG: Stable.  On Eliquis and Zocor. Type 2 diabetes with hyperglycemia: Patient on sliding scale insulin and glipizide at home.  Will keep on insulin while in the hospital. GERD: On PPI.  Continue.    DVT prophylaxis:  apixaban (ELIQUIS) tablet 5 mg   Code Status: Full code Family Communication: None at the bedside Disposition Plan: Status is: Observation The patient will require care spanning > 2 midnights and should be moved to inpatient because: IV antibiotics     Consultants:  None  Procedures:  None  Antimicrobials:  Rocephin and vancomycin 1/28---     Objective: Vitals:   02/09/23 0155 02/09/23 0500 02/09/23 0733 02/09/23 1104  BP: (!) 105/59 (!) 98/54 105/62 103/64  Pulse: 90 93 88 96  Resp: 16 17 16 20   Temp: (!) 97.4 F (36.3 C) 97.9 F (36.6 C) (!) 97.5 F (36.4 C) 98.3 F (36.8 C)  TempSrc: Oral  Oral Oral  SpO2: 100% 97% 100% 100%  Weight:      Height:        Intake/Output Summary (Last 24 hours) at 02/09/2023 1342 Last data filed at 02/09/2023 1145 Gross per 24 hour  Intake 1399.8 ml  Output --  Net 1399.8 ml   Filed Weights   02/08/23 1551  Weight: 66.8 kg    Examination:  General exam: Appears calm and comfortable  Respiratory system: Clear to auscultation. Respiratory effort normal. Cardiovascular system: S1 & S2 heard, RRR.  Gastrointestinal system: Abdomen is nondistended, soft and nontender. No organomegaly or masses felt. Normal bowel sounds heard.  Central nervous system: Alert and oriented. No focal neurological deficits. Extremities: Symmetric 5 x 5 power. Skin:  Patient has a quarter sized open wound on the dorsum of the left foot.  No drainage.  Redness and erythema extending up to the ankle and lower legs.  No palpable abscess.    Data Reviewed: I have personally reviewed following labs and imaging  studies  CBC: Recent Labs  Lab 02/08/23 1624 02/09/23 0903  WBC 31.2* 28.0*  NEUTROABS 28.7*  --   HGB 12.7* 11.2*  HCT 40.1 35.0*  MCV 108.4* 107.4*  PLT 152 149*   Basic Metabolic Panel: Recent Labs  Lab 02/08/23 1624 02/09/23 0903  NA 142 140  K 4.1 3.5  CL 105 106  CO2 23 25  GLUCOSE 235* 87  BUN 18 14  CREATININE 1.56* 1.15  CALCIUM 9.4 8.8*   GFR: Estimated Creatinine Clearance: 47.6 mL/min (by C-G formula based on SCr of 1.15 mg/dL). Liver Function Tests: Recent Labs  Lab 02/08/23 1624  AST 26  ALT 30  ALKPHOS 79  BILITOT 0.9  PROT 5.7*  ALBUMIN 3.6   No results for input(s): "LIPASE", "AMYLASE" in the last 168 hours. No results for input(s): "AMMONIA" in the last 168 hours. Coagulation Profile: No results for input(s): "INR", "PROTIME" in the last 168 hours. Cardiac Enzymes: No results for input(s): "CKTOTAL", "CKMB", "CKMBINDEX", "TROPONINI" in the last 168 hours. BNP (last 3 results) No results for input(s): "PROBNP" in the last 8760 hours. HbA1C: Recent Labs    02/08/23 2252  HGBA1C 6.2*   CBG: Recent Labs  Lab 02/09/23 0727 02/09/23 0839 02/09/23 1150  GLUCAP 56* 78 103*   Lipid Profile: No results for input(s): "CHOL", "HDL", "LDLCALC", "TRIG", "CHOLHDL", "LDLDIRECT" in the last 72 hours. Thyroid Function Tests: No results for input(s): "TSH", "T4TOTAL", "FREET4", "T3FREE", "THYROIDAB" in the last 72 hours. Anemia Panel: Recent Labs    02/09/23 0500 02/09/23 0903  VITAMINB12  --  399  FOLATE 13.3  --    Sepsis Labs: Recent Labs  Lab 02/08/23 1629 02/08/23 2055 02/08/23 2300  LATICACIDVEN 3.2* 2.2* 1.4    Recent Results (from the past 240 hours)  Blood culture (routine x 2)     Status: None (Preliminary result)   Collection Time: 02/08/23  3:53 PM   Specimen: BLOOD RIGHT ARM  Result Value Ref Range Status   Specimen Description BLOOD RIGHT ARM  Final   Special Requests   Final    BOTTLES DRAWN AEROBIC AND ANAEROBIC  Blood Culture adequate volume   Culture   Final    NO GROWTH < 24 HOURS Performed at [facility], [address], [place], [place] [address]    Report Status PENDING  Incomplete  Blood culture (routine x 2)     Status: None (Preliminary result)   Collection Time: 02/08/23  3:58 PM   Specimen: BLOOD LEFT ARM  Result Value Ref Range Status   Specimen Description BLOOD LEFT ARM  Final   Special Requests   Final    BOTTLES DRAWN AEROBIC AND ANAEROBIC Blood Culture adequate volume   Culture   Final    NO GROWTH < 24 HOURS Performed at [facility], [address] [place], [place] [address]    Report Status PENDING  Incomplete         Radiology Studies: VAS [place] LOWER EXTREMITY VENOUS (DVT) Result Date: 02/09/2023  Lower Venous DVT Study Patient Name:  Donald Rios [facility]  Date  of Exam:   02/09/2023 Medical Rec #: [id number]       Accession #:    [id number] Date of Birth: Oct 25, 1940       Patient Gender: M Patient Age:   89 years Exam Location:  [facility] Procedure:      VAS [place] LOWER EXTREMITY VENOUS (DVT) Referring Phys: Merrilyn Puma --------------------------------------------------------------------------------  Indications: Swelling. Other Indications: Cellulitis. Comparison Study: No previous exams Performing Technologist: Jody Hill RVT, RDMS  Examination Guidelines: A complete evaluation includes B-mode imaging, spectral Doppler, color Doppler, and power Doppler as needed of all accessible portions of each vessel. Bilateral testing is considered an integral part of a complete examination. Limited examinations for reoccurring indications may be performed as noted. The reflux portion of the exam is performed with the patient in reverse Trendelenburg.  +-----+---------------+---------+-----------+----------+--------------+ RIGHTCompressibilityPhasicitySpontaneityPropertiesThrombus Aging +-----+---------------+---------+-----------+----------+--------------+ CFV  Full            Yes      Yes                                 +-----+---------------+---------+-----------+----------+--------------+   +---------+---------------+---------+-----------+----------+--------------+ LEFT     CompressibilityPhasicitySpontaneityPropertiesThrombus Aging +---------+---------------+---------+-----------+----------+--------------+ CFV      Full           Yes      Yes                                 +---------+---------------+---------+-----------+----------+--------------+ SFJ      Full                                                        +---------+---------------+---------+-----------+----------+--------------+ FV Prox  Full           Yes      Yes                                 +---------+---------------+---------+-----------+----------+--------------+ FV Mid   Full           Yes      Yes                                 +---------+---------------+---------+-----------+----------+--------------+ FV DistalFull           Yes      Yes                                 +---------+---------------+---------+-----------+----------+--------------+ PFV      Full                                                        +---------+---------------+---------+-----------+----------+--------------+ POP      Full           Yes      Yes                                 +---------+---------------+---------+-----------+----------+--------------+  PTV      Full                                                        +---------+---------------+---------+-----------+----------+--------------+ PERO     Full                                                        +---------+---------------+---------+-----------+----------+--------------+    Summary: RIGHT: - No evidence of common femoral vein obstruction.   LEFT: - There is no evidence of deep vein thrombosis in the lower extremity.  - No cystic structure found in the popliteal fossa. Subcutaneous edema  seen in area of calf and ankle.  *See table(s) above for measurements and observations.    Preliminary    DG Foot Complete Left Result Date: 02/08/2023 CLINICAL DATA:  Rule out osteomyelitis.  Left foot wound. EXAM: LEFT FOOT - COMPLETE 3+ VIEW COMPARISON:  None Available. FINDINGS: There is a bandage along the dorsal aspect of the foot. Negative for acute fracture or dislocation. No evidence for cortical destruction or periosteal reaction. Spurring involving the calcaneus. Atherosclerotic calcifications in left foot. IMPRESSION: 1. No acute bone abnormality to the left foot. 2. No radiographic evidence for osteomyelitis. 3. Atherosclerotic calcifications. Electronically Signed   By: Richarda Overlie M.D.   On: 02/08/2023 17:11        Scheduled Meds:  amiodarone  200 mg Oral Daily   apixaban  5 mg Oral BID   insulin aspart  0-15 Units Subcutaneous TID WC   metoprolol tartrate  100 mg Oral BID   mirtazapine  15 mg Oral QHS   pantoprazole  40 mg Oral Daily   simvastatin  40 mg Oral QHS   Continuous Infusions:  cefTRIAXone (ROCEPHIN)  IV Stopped (02/09/23 1145)   vancomycin       LOS: 0 days    Time spent: 40 minutes    Dorcas Carrow, MD [facility]

## 2023-02-09 NOTE — ED Notes (Addendum)
This RN not notified on patient's CBG due to providing direct patient care to an alternate patient. Patient finished majority of breakfast tray. Patient to echo at this time. This RN to go to echo to re-check patient's CBG.

## 2023-02-09 NOTE — Progress Notes (Signed)
Pharmacy Antibiotic Note  Donald Rios is a 83 y.o. male admitted on 02/08/2023 presenting with cellulitis.  Pharmacy has been consulted for vancomycin dosing.  Vancomycin 1500 mg IV x 1 given in ED  Plan: Vancomycin 1g IV q 24h (eAUC 473) Monitor renal function, Cx and clinical progression to narrow Vancomycin levels as needed  Height: 5\' 8"  (172.7 cm) Weight: 66.8 kg (147 lb 4.3 oz) IBW/kg (Calculated) : 68.4  Temp (24hrs), Avg:97.8 F (36.6 C), Min:97.4 F (36.3 C), Max:98.3 F (36.8 C)  Recent Labs  Lab 02/08/23 1624 02/08/23 1629 02/08/23 2055 02/08/23 2300 02/09/23 0903  WBC 31.2*  --   --   --  28.0*  CREATININE 1.56*  --   --   --  1.15  LATICACIDVEN  --  3.2* 2.2* 1.4  --     Estimated Creatinine Clearance: 47.6 mL/min (by C-G formula based on SCr of 1.15 mg/dL).    No Known Allergies  Daylene Posey, PharmD, [facility] Clinical Pharmacist ED Pharmacist Phone # [phone] 02/09/2023 1:33 PM

## 2023-02-09 NOTE — Inpatient Diabetes Management (Signed)
Inpatient Diabetes Program Recommendations  AACE/ADA: New Consensus Statement on Inpatient Glycemic Control (2015)  Target Ranges:  Prepandial:   less than 140 mg/dL      Peak postprandial:   less than 180 mg/dL (1-2 hours)      Critically ill patients:  140 - 180 mg/dL   Lab Results  Component Value Date   GLUCAP 103 (H) 02/09/2023   HGBA1C 6.2 (H) 02/08/2023    Review of Glycemic Control  Latest Reference Range & Units 02/09/23 07:27 02/09/23 08:39 02/09/23 11:50  Glucose-Capillary 70 - 99 mg/dL 56 (L) 78 086 (H)   Diabetes history: DM 2 Outpatient Diabetes medications:  Jardiance 10 mg daily Glipizide 5 mg daily Fiasp 0-10 units tid with  meals (unsure if patient is still taking this?) Current orders for Inpatient glycemic control:  Novolog 0-15 units tid with meals  Inpatient Diabetes Program Recommendations:    Please consider reducing Novolog to very sensitive (0-6 units) tid with meals.  Thanks,  Lorenza Cambridge, RN, BC-ADM Inpatient Diabetes Coordinator Pager [phone]  (8a-5p)

## 2023-02-09 NOTE — Consult Note (Addendum)
WOC Nurse Consult Note: patient with L dorsal foot wound for at least 2 months that has been followed by his primary MD Dr. Tanya Nones at [facility] who has been prescribing once daily NS wet to dry dressings  Reason for Consult: chronic LLE wound w/cellulitis  Wound type: full thickness unknown etiology  Pressure Injury POA: NA  Measurement: see nursing flowsheet  Wound bed: 80% red moist 20% tan around edges of wound  Drainage (amount, consistency, odor) see nursing flowsheet  Periwound: erythema and edema (being treated for cellulitis)  Dressing procedure/placement/frequency: Cleanse dorsal L foot wound with Vashe wound cleanser [employer] [place] [phone]), apply a small piece of Vashe moistened gauze to wound bed daily, cover with dry gauze and  Kerlix roll gauze wrapped around foot and ankle to secure.    Had MRI of L foot 12/2022 which was negative for osteomyelitis.  Plain film x-rays also show no sign of osteomyelitis.   Patient would benefit from referral to wound care center at discharge for ongoing management of this non-healing wound.   POC discussed with bedside nurse. WOC team will not follow. Re-consult if further needs arise.   Thank you,    Priscella Mann MSN, RN-BC, [employer] [phone]

## 2023-02-10 ENCOUNTER — Inpatient Hospital Stay (HOSPITAL_COMMUNITY): Payer: Medicare Other

## 2023-02-10 ENCOUNTER — Telehealth: Payer: Self-pay

## 2023-02-10 DIAGNOSIS — I96 Gangrene, not elsewhere classified: Secondary | ICD-10-CM

## 2023-02-10 DIAGNOSIS — L0390 Cellulitis, unspecified: Secondary | ICD-10-CM | POA: Diagnosis not present

## 2023-02-10 DIAGNOSIS — M6289 Other specified disorders of muscle: Secondary | ICD-10-CM | POA: Diagnosis not present

## 2023-02-10 DIAGNOSIS — A419 Sepsis, unspecified organism: Secondary | ICD-10-CM | POA: Diagnosis not present

## 2023-02-10 LAB — COMPREHENSIVE METABOLIC PANEL
ALT: 20 U/L (ref 0–44)
AST: 15 U/L (ref 15–41)
Albumin: 2.5 g/dL — ABNORMAL LOW (ref 3.5–5.0)
Alkaline Phosphatase: 64 U/L (ref 38–126)
Anion gap: 10 (ref 5–15)
BUN: 12 mg/dL (ref 8–23)
CO2: 22 mmol/L (ref 22–32)
Calcium: 8.4 mg/dL — ABNORMAL LOW (ref 8.9–10.3)
Chloride: 107 mmol/L (ref 98–111)
Creatinine, Ser: 1.08 mg/dL (ref 0.61–1.24)
GFR, Estimated: >60 mL/min (ref >60)
Glucose, Bld: 100 mg/dL — ABNORMAL HIGH (ref 70–99)
Potassium: 4.2 mmol/L (ref 3.5–5.1)
Sodium: 139 mmol/L (ref 135–145)
Total Bilirubin: 0.8 mg/dL (ref 0.0–1.2)
Total Protein: 4.2 g/dL — ABNORMAL LOW (ref 6.5–8.1)

## 2023-02-10 LAB — MAGNESIUM: Magnesium: 2.2 mg/dL (ref 1.7–2.4)

## 2023-02-10 LAB — PROCALCITONIN: Procalcitonin: 0.18 ng/mL

## 2023-02-10 LAB — CBC WITH DIFFERENTIAL/PLATELET
Abs Immature Granulocytes: 0.47 10*3/uL — ABNORMAL HIGH (ref 0.00–0.07)
Basophils Absolute: 0.1 10*3/uL (ref 0.0–0.1)
Basophils Relative: 0 %
Eosinophils Absolute: 0.2 10*3/uL (ref 0.0–0.5)
Eosinophils Relative: 1 %
HCT: 31.4 % — ABNORMAL LOW (ref 39.0–52.0)
Hemoglobin: 10.1 g/dL — ABNORMAL LOW (ref 13.0–17.0)
Immature Granulocytes: 2 %
Lymphocytes Relative: 11 %
Lymphs Abs: 2.4 10*3/uL (ref 0.7–4.0)
MCH: 34.2 pg — ABNORMAL HIGH (ref 26.0–34.0)
MCHC: 32.2 g/dL (ref 30.0–36.0)
MCV: 106.4 fL — ABNORMAL HIGH (ref 80.0–100.0)
Monocytes Absolute: 0.8 10*3/uL (ref 0.1–1.0)
Monocytes Relative: 4 %
Neutro Abs: 17.9 10*3/uL — ABNORMAL HIGH (ref 1.7–7.7)
Neutrophils Relative %: 82 %
Platelets: 107 10*3/uL — ABNORMAL LOW (ref 150–400)
RBC: 2.95 MIL/uL — ABNORMAL LOW (ref 4.22–5.81)
RDW: 15.3 % (ref 11.5–15.5)
WBC: 21.9 10*3/uL — ABNORMAL HIGH (ref 4.0–10.5)
nRBC: 0.1 % (ref 0.0–0.2)

## 2023-02-10 LAB — MRSA NEXT GEN BY PCR, NASAL: MRSA by PCR Next Gen: NOT DETECTED

## 2023-02-10 LAB — GLUCOSE, CAPILLARY
Glucose-Capillary: 101 mg/dL — ABNORMAL HIGH (ref 70–99)
Glucose-Capillary: 157 mg/dL — ABNORMAL HIGH (ref 70–99)
Glucose-Capillary: 256 mg/dL — ABNORMAL HIGH (ref 70–99)
Glucose-Capillary: 268 mg/dL — ABNORMAL HIGH (ref 70–99)

## 2023-02-10 LAB — VAS US ABI WITH/WO TBI
Left ABI: 2.24
Right ABI: 2.24

## 2023-02-10 LAB — C-REACTIVE PROTEIN: CRP: 0.7 mg/dL (ref <1.0)

## 2023-02-10 MED ORDER — PREDNISONE 5 MG PO TABS
10.0000 mg | ORAL_TABLET | Freq: Once | ORAL | Status: AC
  Administered 2023-02-10: 10 mg via ORAL
  Filled 2023-02-10: qty 2

## 2023-02-10 MED ORDER — PREDNISONE 5 MG PO TABS: 10.0000 mg | ORAL_TABLET | Freq: Every day | ORAL | Status: DC

## 2023-02-10 MED ORDER — METOPROLOL TARTRATE 50 MG PO TABS
50.0000 mg | ORAL_TABLET | Freq: Two times a day (BID) | ORAL | Status: DC
  Administered 2023-02-10 (×2): 50 mg via ORAL
  Filled 2023-02-10: qty 1

## 2023-02-10 NOTE — Progress Notes (Signed)
VASCULAR LAB    ABI has been performed.  See CV proc for preliminary results.   Kashmere Staffa, RVT 02/10/2023, 12:00 PM

## 2023-02-10 NOTE — Plan of Care (Signed)
  Problem: Education: Goal: Ability to describe self-care measures that may prevent or decrease complications (Diabetes Survival Skills Education) will improve Outcome: Progressing Goal: Individualized Educational Video(s) Outcome: Progressing   Problem: Coping: Goal: Ability to adjust to condition or change in health will improve Outcome: Progressing   Problem: Fluid Volume: Goal: Ability to maintain a balanced intake and output will improve Outcome: Progressing   Problem: Health Behavior/Discharge Planning: Goal: Ability to identify and utilize available resources and services will improve Outcome: Progressing Goal: Ability to manage health-related needs will improve Outcome: Progressing   Problem: Metabolic: Goal: Ability to maintain appropriate glucose levels will improve Outcome: Progressing   Problem: Nutritional: Goal: Maintenance of adequate nutrition will improve Outcome: Progressing Goal: Progress toward achieving an optimal weight will improve Outcome: Progressing   Problem: Skin Integrity: Goal: Risk for impaired skin integrity will decrease Outcome: Progressing   Problem: Tissue Perfusion: Goal: Adequacy of tissue perfusion will improve Outcome: Progressing   Problem: Education: Goal: Knowledge of General Education information will improve Description: Including pain rating scale, medication(s)/side effects and non-pharmacologic comfort measures Outcome: Progressing   Problem: Health Behavior/Discharge Planning: Goal: Ability to manage health-related needs will improve Outcome: Progressing   Problem: Clinical Measurements: Goal: Ability to maintain clinical measurements within normal limits will improve Outcome: Progressing Goal: Will remain free from infection Outcome: Progressing Goal: Diagnostic test results will improve Outcome: Progressing Goal: Respiratory complications will improve Outcome: Progressing Goal: Cardiovascular complication will  be avoided Outcome: Progressing   Problem: Activity: Goal: Risk for activity intolerance will decrease Outcome: Progressing   Problem: Nutrition: Goal: Adequate nutrition will be maintained Outcome: Progressing   Problem: Coping: Goal: Level of anxiety will decrease Outcome: Progressing   Problem: Elimination: Goal: Will not experience complications related to bowel motility Outcome: Progressing Goal: Will not experience complications related to urinary retention Outcome: Progressing   Problem: Pain Managment: Goal: General experience of comfort will improve and/or be controlled Outcome: Progressing   Problem: Safety: Goal: Ability to remain free from injury will improve Outcome: Progressing   Problem: Skin Integrity: Goal: Risk for impaired skin integrity will decrease Outcome: Progressing   Problem: Clinical Measurements: Goal: Ability to avoid or minimize complications of infection will improve Outcome: Progressing   Problem: Skin Integrity: Goal: Skin integrity will improve Outcome: Progressing   Problem: Education: Goal: Knowledge of disease or condition will improve Outcome: Progressing Goal: Understanding of medication regimen will improve Outcome: Progressing Goal: Individualized Educational Video(s) Outcome: Progressing   Problem: Activity: Goal: Ability to tolerate increased activity will improve Outcome: Progressing   Problem: Cardiac: Goal: Ability to achieve and maintain adequate cardiopulmonary perfusion will improve Outcome: Progressing   Problem: Health Behavior/Discharge Planning: Goal: Ability to safely manage health-related needs after discharge will improve Outcome: Progressing

## 2023-02-10 NOTE — Progress Notes (Signed)
PROGRESS NOTE                                                                                                                                                                                                             Patient Demographics:    Donald Rios, is a 83 y.o. male, DOB - 1940/12/05, ZOX:[id number]  Outpatient Primary MD for the patient is [name], MD    LOS - 1  Admit date - 02/08/2023    Chief Complaint  Patient presents with   Leg Swelling       Brief Narrative (HPI from H&P)   83 year old gentleman with history of chronic A-fib on Eliquis, chronic systolic heart failure, coronary artery disease, type 2 diabetes on glipizide, GERD. Patient recently developed temporal arteritis and was on high-dose steroids and currently on tapering his steroids. While on high-dose steroids he started developing wound on his dorsum of the left foot about 2 months ago. Followed by wound care, multiple rounds of antibiotics. Recently treated with Augmentin. Continue to have more pain, redness so sent to ER from wound care center. At the emergency room initially hypotensive improved with IV fluids. He was in A-fib. Leukocyte count 31,000. Blood sugar 235. Creatinine 1.56, lactic acid 3.2. Blood cultures were collected. Started on broad-spectrum antibiotics.    Subjective:    Donald Rios today has, No headache, No chest pain, No abdominal pain - No Nausea, No new weakness tingling or numbness, no SOB.   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 Outpatient antibiotic courses, currently on Rocephin and vancomycin, sepsis pathophysiology is improving, lower extremity venous duplex unremarkable, MRI done 12/28/2023 is also unremarkable however it is close to 46 weeks old, follow cultures, Dr. Lajoyce Corners consulted, check  ABI.  Continue wound care and keep leg elevated..   Acute kidney injury: Resuscitated.  Improved.   Chronic A-fib with [place] vas 2 score of greater than 3: Rate controlled now.  Eliquis dose was reduced to half last night.  He will go back on regular doses.   Chronic systolic heart failure: Currently euvolemic.  He was given IV fluids overnight.  Known ejection fraction of 25%.  Holding losartan and Jardiance. On spironolactone and metoprolol today.  Coronary artery disease status post CABG: Stable.  On Eliquis and Zocor.  GERD: On PPI.  Continue.  Chronic oral steroid use.  Due to eye issue per family members, has been on it for close to 1 year according to the wife, we will place him on 10 mg for now as blood pressures are slightly soft.  Monitor.    Type 2 diabetes with hyperglycemia: Patient on sliding scale insulin and glipizide at home.  Will keep on insulin while in the hospital.  Lab Results  Component Value Date   HGBA1C 6.2 (H) 02/08/2023   CBG (last 3)  Recent Labs    02/09/23 1150 02/09/23 2112 02/10/23 0753  GLUCAP 103* 123* 101*         Condition - Fair  Family Communication  : Wife [name] [phone] on 02/10/2023  Code Status :  Full  Consults  :  Ortho  PUD Prophylaxis : PPI   Procedures  :     MRI of the left foot.  12/28/22 - 1. Mild subcutaneous edema anterior to the tibial tubercle could be due to cellulitis. No abscess or osteomyelitis is seen. 2. Distal patellar tendinosis.  Lower extremity venous duplex.  No DVT.  ABI.      Disposition Plan  :    Status is: Inpatient   DVT Prophylaxis  :     apixaban (ELIQUIS) tablet 5 mg     Lab Results  Component Value Date   PLT 107 (L) 02/10/2023    Diet :  Diet Order             Diet Carb Modified Fluid consistency: Thin; Room service appropriate? Yes  Diet effective now                    Inpatient Medications  Scheduled Meds:  allopurinol  300 mg Oral QHS   amiodarone   200 mg Oral Daily   apixaban  5 mg Oral BID   doxazosin  2 mg Oral QHS   insulin aspart  0-5 Units Subcutaneous QHS   insulin aspart  0-6 Units Subcutaneous TID WC   metoprolol tartrate  100 mg Oral BID   mirtazapine  15 mg Oral QHS   pantoprazole  40 mg Oral Daily   predniSONE  2.5 mg Oral Q breakfast   simvastatin  40 mg Oral QHS   Continuous Infusions:  cefTRIAXone (ROCEPHIN)  IV Stopped (02/09/23 1145)   vancomycin 1,000 mg (02/09/23 2122)   PRN Meds:.acetaminophen **OR** acetaminophen, oxyCODONE, senna-docusate  Antibiotics  :    Anti-infectives (From admission, onward)    Start     Dose/Rate Route Frequency Ordered Stop   02/09/23 2200  vancomycin (VANCOCIN) IVPB 1000 mg/200 mL premix        1,000 mg 200 mL/hr over 60 Minutes Intravenous Every 24 hours 02/09/23 1336     02/09/23 1000  cefTRIAXone (ROCEPHIN) 1 g in sodium chloride 0.9 % 100 mL IVPB        1 g 200 mL/hr over 30 Minutes Intravenous Every 24 hours 02/08/23 2206 02/16/23 0959   02/08/23 2130  vancomycin (VANCOREADY) IVPB 1500 mg/300 mL        1,500 mg 150 mL/hr over 120 Minutes Intravenous  Once 02/08/23 2122 02/09/23 0000         Objective:   Vitals:   02/10/23 0400 02/10/23 0500 02/10/23 0700 02/10/23 0752  BP: (!) 113/45   (!) 105/45  Pulse: 85 88 88 88  Resp: 17 18 15 19   Temp:    98.3 F (36.8 C)  TempSrc:    Oral  SpO2: 97% 94% 94% 94%  Weight:      Height:        Wt Readings from Last 3 Encounters:  02/08/23 66.8 kg  01/27/23 66.9 kg  01/25/23 65.8 kg     Intake/Output Summary (Last 24 hours) at 02/10/2023 0759 Last data filed at 02/10/2023 0500 Gross per 24 hour  Intake 99.8 ml  Output 850 ml  Net -750.2 ml     Physical Exam  Awake Alert, No new F.N deficits, Normal affect [place].AT,PERRAL Supple Neck, No JVD,   Symmetrical Chest wall movement, Good air movement bilaterally, CTAB RRR,No Gallops,Rubs or new Murmurs,  +ve B.Sounds, Abd Soft, No tenderness,   L foot under  bandage, picture from the day of admission      Data Review:    Recent Labs  Lab 02/08/23 1624 02/09/23 0903 02/10/23 0422  WBC 31.2* 28.0* 21.9*  HGB 12.7* 11.2* 10.1*  HCT 40.1 35.0* 31.4*  PLT 152 149* 107*  MCV 108.4* 107.4* 106.4*  MCH 34.3* 34.4* 34.2*  MCHC 31.7 32.0 32.2  RDW 15.3 15.4 15.3  LYMPHSABS 1.1  --  2.4  MONOABS 0.9  --  0.8  EOSABS 0.0  --  0.2  BASOSABS 0.1  --  0.1    Recent Labs  Lab 02/08/23 1624 02/08/23 1629 02/08/23 2055 02/08/23 2252 02/08/23 2300 02/09/23 0903 02/10/23 0422  NA 142  --   --   --   --  140 139  K 4.1  --   --   --   --  3.5 4.2  CL 105  --   --   --   --  106 107  CO2 23  --   --   --   --  25 22  ANIONGAP 14  --   --   --   --  9 10  GLUCOSE 235*  --   --   --   --  87 100*  BUN 18  --   --   --   --  14 12  CREATININE 1.56*  --   --   --   --  1.15 1.08  AST 26  --   --   --   --   --  15  ALT 30  --   --   --   --   --  20  ALKPHOS 79  --   --   --   --   --  64  BILITOT 0.9  --   --   --   --   --  0.8  ALBUMIN 3.6  --   --   --   --   --  2.5*  PROCALCITON  --   --   --   --   --   --  0.18  LATICACIDVEN  --  3.2* 2.2*  --  1.4  --   --   HGBA1C  --   --   --  6.2*  --   --   --   MG  --   --   --   --   --   --  2.2  CALCIUM 9.4  --   --   --   --  8.8* 8.4*      Recent Labs  Lab 02/08/23 1624 02/08/23 1629 02/08/23  2055 02/08/23 2252 02/08/23 2300 02/09/23 0903 02/10/23 0422  PROCALCITON  --   --   --   --   --   --  0.18  LATICACIDVEN  --  3.2* 2.2*  --  1.4  --   --   HGBA1C  --   --   --  6.2*  --   --   --   MG  --   --   --   --   --   --  2.2  CALCIUM 9.4  --   --   --   --  8.8* 8.4*    --------------------------------------------------------------------------------------------------------------- Lab Results  Component Value Date   CHOL 151 12/02/2022   HDL 54.60 12/02/2022   LDLCALC 76 12/02/2022   TRIG 101.0 12/02/2022   CHOLHDL 3 12/02/2022    Lab Results  Component Value  Date   HGBA1C 6.2 (H) 02/08/2023   No results for input(s): "TSH", "T4TOTAL", "FREET4", "T3FREE", "THYROIDAB" in the last 72 hours. Recent Labs    02/09/23 0500 02/09/23 0903  VITAMINB12  --  399  FOLATE 13.3  --    ------------------------------------------------------------------------------------------------------------------ Cardiac Enzymes No results for input(s): "CKMB", "TROPONINI", "MYOGLOBIN" in the last 168 hours.  Invalid input(s): "CK"  Micro Results Recent Results (from the past 240 hours)  Blood culture (routine x 2)     Status: None (Preliminary result)   Collection Time: 02/08/23  3:53 PM   Specimen: BLOOD RIGHT ARM  Result Value Ref Range Status   Specimen Description BLOOD RIGHT ARM  Final   Special Requests   Final    BOTTLES DRAWN AEROBIC AND ANAEROBIC Blood Culture adequate volume   Culture   Final    NO GROWTH < 24 HOURS Performed at [facility], [address] [place], [place] [address]    Report Status PENDING  Incomplete  Blood culture (routine x 2)     Status: None (Preliminary result)   Collection Time: 02/08/23  3:58 PM   Specimen: BLOOD LEFT ARM  Result Value Ref Range Status   Specimen Description BLOOD LEFT ARM  Final   Special Requests   Final    BOTTLES DRAWN AEROBIC AND ANAEROBIC Blood Culture adequate volume   Culture   Final    NO GROWTH < 24 HOURS Performed at [facility], [address], [place], [place] [address]    Report Status PENDING  Incomplete    Radiology Reports VAS [place] LOWER EXTREMITY VENOUS (DVT) Result Date: 02/09/2023  Lower Venous DVT Study Patient Name:  ISIDOR BROMELL  Date of Exam:   02/09/2023 Medical Rec #: [id number]       Accession #:    [id number] Date of Birth: 04/05/1940       Patient Gender: M Patient Age:   37 years Exam Location:  [facility] Procedure:      VAS [place] LOWER EXTREMITY VENOUS (DVT) Referring Phys: Merrilyn Puma  --------------------------------------------------------------------------------  Indications: Swelling. Other Indications: Cellulitis. Comparison Study: No previous exams Performing Technologist: Jody Hill RVT, RDMS  Examination Guidelines: A complete evaluation includes B-mode imaging, spectral Doppler, color Doppler, and power Doppler as needed of all accessible portions of each vessel. Bilateral testing is considered an integral part of a complete examination. Limited examinations for reoccurring indications may be performed as noted. The reflux portion of the exam is performed with the patient in reverse Trendelenburg.  +-----+---------------+---------+-----------+----------+--------------+ RIGHTCompressibilityPhasicitySpontaneityPropertiesThrombus Aging +-----+---------------+---------+-----------+----------+--------------+ CFV  Full  Yes      Yes                                 +-----+---------------+---------+-----------+----------+--------------+   +---------+---------------+---------+-----------+----------+--------------+ LEFT     CompressibilityPhasicitySpontaneityPropertiesThrombus Aging +---------+---------------+---------+-----------+----------+--------------+ CFV      Full           Yes      Yes                                 +---------+---------------+---------+-----------+----------+--------------+ SFJ      Full                                                        +---------+---------------+---------+-----------+----------+--------------+ FV Prox  Full           Yes      Yes                                 +---------+---------------+---------+-----------+----------+--------------+ FV Mid   Full           Yes      Yes                                 +---------+---------------+---------+-----------+----------+--------------+ FV DistalFull           Yes      Yes                                  +---------+---------------+---------+-----------+----------+--------------+ PFV      Full                                                        +---------+---------------+---------+-----------+----------+--------------+ POP      Full           Yes      Yes                                 +---------+---------------+---------+-----------+----------+--------------+ PTV      Full                                                        +---------+---------------+---------+-----------+----------+--------------+ PERO     Full                                                        +---------+---------------+---------+-----------+----------+--------------+    Summary: RIGHT: - No evidence of common femoral vein obstruction.   LEFT: - There is no evidence of  deep vein thrombosis in the lower extremity.  - No cystic structure found in the popliteal fossa. Subcutaneous edema seen in area of calf and ankle.  *See table(s) above for measurements and observations. Electronically signed by Heath Lark on 02/09/2023 at 4:11:54 PM.    Final    DG Foot Complete Left Result Date: 02/08/2023 CLINICAL DATA:  Rule out osteomyelitis.  Left foot wound. EXAM: LEFT FOOT - COMPLETE 3+ VIEW COMPARISON:  None Available. FINDINGS: There is a bandage along the dorsal aspect of the foot. Negative for acute fracture or dislocation. No evidence for cortical destruction or periosteal reaction. Spurring involving the calcaneus. Atherosclerotic calcifications in left foot. IMPRESSION: 1. No acute bone abnormality to the left foot. 2. No radiographic evidence for osteomyelitis. 3. Atherosclerotic calcifications. Electronically Signed   By: Richarda Overlie M.D.   On: 02/08/2023 17:11      Signature  -   Susa Raring M.D on 02/10/2023 at 7:59 AM   -  To page go to [email or web address]

## 2023-02-10 NOTE — Consult Note (Signed)
ORTHOPAEDIC CONSULTATION  REQUESTING PHYSICIAN: Leroy Sea, MD  Chief Complaint: Ischemic ulcers to the left forefoot with a ulcer on the dorsum of the left midfoot.  HPI: Donald Rios is a 83 y.o. male who presents with ischemic ulcerations to the toes with a ulcer on the dorsum of the left midfoot.  Patient states that this ulceration and ischemic changes became worse after he developed swelling from prednisone.  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    History of MI (myocardial infarction)    History of nuclear stress test 08/07/2010   dipyridamole; EKG negative for ischemia, low risk scan    Hyperlipidemia    Hyperplastic colon polyp 05/2008   Hypertension    Ischemic cardiomyopathy    EF 45%, with inferior wall motion abnormality    Myocardial infarction (HCC)    Valvular regurgitation    mitral and tricuspid (mild)   Past Surgical History:  Procedure Laterality Date   CARDIOVERSION N/A 08/16/2022   Procedure: CARDIOVERSION;  Surgeon: Jake Bathe, MD;  Location: [facility];  Service: Cardiovascular;  Laterality: N/A;   CORONARY ARTERY BYPASS GRAFT  2000   LIMA to LAD, free IMA to OM2, sequential graft to PLA & PLD   ESOPHAGOGASTRODUODENOSCOPY N/A 05/07/2013   Procedure: ESOPHAGOGASTRODUODENOSCOPY (EGD);  Surgeon: Iva Boop, MD;  Location: [facility] ENDOSCOPY;  Service: Endoscopy;  Laterality: N/A;   TEE WITHOUT CARDIOVERSION N/A 08/16/2022   Procedure: TRANSESOPHAGEAL ECHOCARDIOGRAM;  Surgeon: Jake Bathe, MD;   Location: [facility];  Service: Cardiovascular;  Laterality: N/A;   TRANSTHORACIC ECHOCARDIOGRAM  09/07/2010   EF 45-50%; LV systolic function mildly reduced; LA mildly dilated; mild-mod MR & mild-mod TR; aortic root sclerosis/calcification;    Social History   Socioeconomic History   Marital status: Married    Spouse name: Not on file   Number of children: 1   Years of education: Not on file   Highest education level: Not on file  Occupational History   Not on file  Tobacco Use   Smoking status: Former    Current packs/day: 0.00    Types: Cigarettes    Quit date: 11/04/1985    Years since quitting: 37.2   Smokeless tobacco: Never  Vaping Use   Vaping status: Never Used  Substance and Sexual Activity   Alcohol use: Not Currently    Alcohol/week: 2.0 - 3.0 standard drinks of alcohol    Types: 2 - 3 Cans of beer per week   Drug use: No   Sexual activity: Not on file  Other Topics Concern   Not on file  Social History Narrative   Not on file   Social Drivers of Health   Financial Resource Strain: Low Risk  (08/24/2022)   Overall Financial Resource Strain (CARDIA)    Difficulty of Paying Living Expenses: Not hard at all  Food Insecurity: No Food Insecurity (02/09/2023)   Hunger Vital  Sign    Worried About Programme researcher, broadcasting/film/video in the Last Year: Never true    Ran Out of Food in the Last Year: Never true  Transportation Needs: No Transportation Needs (02/09/2023)   PRAPARE - Administrator, Civil Service (Medical): No    Lack of Transportation (Non-Medical): No  Physical Activity: Sufficiently Active (07/10/2021)   Exercise Vital Sign    Days of Exercise per Week: 5 days    Minutes of Exercise per Session: 30 min  Stress: No Stress Concern Present (08/24/2022)   [employer] of Occupational Health - Occupational Stress Questionnaire    Feeling of Stress : Only a little  Social Connections: Moderately Integrated (02/09/2023)   Social Connection and  Isolation Panel [NHANES]    Frequency of Communication with Friends and Family: Twice a week    Frequency of Social Gatherings with Friends and Family: Twice a week    Attends Religious Services: More than 4 times per year    Active Member of [employer] or Organizations: No    Attends Engineer, structural: Never    Marital Status: Married   Family History  Problem Relation Age of Onset   Stroke Father    Hypertension Father    Diabetes Brother    Esophageal cancer Brother    Stroke Maternal Grandmother    Stroke Maternal Grandfather    Stomach cancer Maternal Aunt    Colon cancer Neg Hx    Rectal cancer Neg Hx    - negative except otherwise stated in the family history section No Known Allergies Prior to Admission medications   Medication Sig Start Date End Date Taking? Authorizing Provider  allopurinol (ZYLOPRIM) 300 MG tablet TAKE 1 TABLET BY MOUTH DAILY Patient taking differently: Take 300 mg by mouth at bedtime. 01/28/23  Yes [name], MD  amiodarone (PACERONE) 200 MG tablet Take 1 tablet by mouth twice daily for 14 days then decrease to 1 tablet once daily. 01/13/23  Yes Hilty, Lisette Abu, MD  amoxicillin-clavulanate (AUGMENTIN) 875-125 MG tablet Take 1 tablet by mouth 2 (two) times daily. 02/04/23  Yes [name], MD  apixaban (ELIQUIS) 5 MG TABS tablet Take 1 tablet (5 mg total) by mouth 2 (two) times daily. 09/20/22  Yes [name], MD  doxazosin (CARDURA) 2 MG tablet TAKE 1 TABLET BY MOUTH DAILY Patient taking differently: Take 2 mg by mouth at bedtime. 08/04/22  Yes [name], MD  empagliflozin (JARDIANCE) 10 MG TABS tablet Take 1 tablet (10 mg total) by mouth daily. 08/16/22  Yes Arty Baumgartner, NP  furosemide (LASIX) 40 MG tablet Take 1 tablet (40 mg total) by mouth 2 (two) times daily. 11/23/22  Yes [name], MD  glipiZIDE (GLIPIZIDE XL) 5 MG 24 hr tablet Take 1 tablet (5 mg total) by mouth daily with breakfast. 12/21/22  Yes  Pickard, Priscille Heidelberg, MD  insulin aspart (FIASP FLEXTOUCH) 100 UNIT/ML FlexTouch Pen Before each meal 3 times a day, 140-199 - 2 units, 200-250 - 4 units, 251-299 - 6 units,  300-349 - 8 units,  350 or above 10 units. Patient taking differently: Inject 3-7 Units into the skin as needed. Before each meal 3 times a day, 140-199 - 2 units, 200-250 - 4 units, 251-299 - 6 units,  300-349 - 8 units,  350 or above 10 units. 08/16/22  Yes Leroy Sea, MD  losartan (COZAAR) 25 MG tablet Take 1 tablet (25 mg total)  by mouth daily. 08/16/22  Yes Laverda Page B, NP  metoprolol tartrate (LOPRESSOR) 50 MG tablet Take 2 tablets (100 mg total) by mouth 2 (two) times daily. 11/25/22  Yes [name], MD  mirtazapine (REMERON) 30 MG tablet TAKE 1 TABLET BY MOUTH EVERY NIGHT AT BEDTIME FOR APPETITE 09/02/22  Yes [name], MD  pantoprazole (PROTONIX) 40 MG tablet Take 1 tablet (40 mg total) by mouth daily. 07/30/22  Yes [name], MD  predniSONE (DELTASONE) 5 MG tablet Take 2.5 mg by mouth daily with breakfast.   Yes [provider]  silver sulfADIAZINE (SILVADENE) 1 % cream Apply 1 Application topically daily. 02/02/23  Yes [provider]  spironolactone (ALDACTONE) 25 MG tablet Take 1 tablet (25 mg total) by mouth 2 (two) times daily. 11/23/22 02/21/23 Yes [name], MD  simvastatin (ZOCOR) 40 MG tablet TAKE ONE TABLET BY MOUTH DAILY Patient taking differently: Take 40 mg by mouth at bedtime. 05/17/22   [name], MD   VAS [place] ABI WITH/WO TBI Result Date: 02/10/2023  LOWER EXTREMITY DOPPLER STUDY Patient Name:  Donald Rios  Date of Exam:   02/10/2023 Medical Rec #: [id number]       Accession #:    [id number] Date of Birth: [birth year]       Patient Gender: M Patient Age:   66 years Exam Location:  [place] Procedure:      VAS [place] ABI WITH/WO TBI Referring Phys: Bess Harvest [facility] --------------------------------------------------------------------------------   Indications: Ulceration. Cellulitis, abscess, sepsis High Risk         Hypertension, hyperlipidemia, Diabetes, coronary artery Factors:          disease. Other Factors: Cardiomyopathy, CHF, history of CABG.  Limitations: Today's exam was limited due to rapid atrial fibrillation. Comparison Study: No prior study on file Performing Technologist: Sherren Kerns RVS  Examination Guidelines: A complete evaluation includes at minimum, Doppler waveform signals and systolic blood pressure reading at the level of bilateral brachial, anterior tibial, and posterior tibial arteries, when vessel segments are accessible. Bilateral testing is considered an integral part of a complete examination. Photoelectric Plethysmograph (PPG) waveforms and toe systolic pressure readings are included as required and additional duplex testing as needed. Limited examinations for reoccurring indications may be performed as noted.  ABI Findings: +---------+------------------+-----+--------+--------+ Right    Rt Pressure (mmHg)IndexWaveformComment  +---------+------------------+-----+--------+--------+ Brachial 114                                     +---------+------------------+-----+--------+--------+ PTA      255               2.24 biphasic         +---------+------------------+-----+--------+--------+ DP       255               2.24 biphasic         +---------+------------------+-----+--------+--------+ Great Toe58                0.51                  +---------+------------------+-----+--------+--------+ +---------+------------------+-----+--------+-------+ Left     Lt Pressure (mmHg)IndexWaveformComment +---------+------------------+-----+--------+-------+ Brachial 113                                    +---------+------------------+-----+--------+-------+ PTA  255               2.24 biphasic        +---------+------------------+-----+--------+-------+ DP       255               2.24  biphasic        +---------+------------------+-----+--------+-------+ Great Toe0                 0.00 Absent          +---------+------------------+-----+--------+-------+ +-------+---------------------+-----------+------------+------------+ ABI/TBIToday's ABI          Today's TBIPrevious ABIPrevious TBI +-------+---------------------+-----------+------------+------------+ Right  2.24 non compressible0.51                                +-------+---------------------+-----------+------------+------------+ Left   2.24 non compressibleabsent                              +-------+---------------------+-----------+------------+------------+ Arterial wall calcification precludes accurate ankle pressures and ABIs.  Summary: Right: Resting right ankle-brachial index indicates noncompressible right lower extremity arteries. The right toe-brachial index is abnormal. Left: Resting left ankle-brachial index indicates noncompressible left lower extremity arteries. Unable to obtain left TBI secondary to absent pulse. *See table(s) above for measurements and observations.  Electronically signed by Sherald Hess MD on 02/10/2023 at 1:02:44 PM.    Final    VAS [place] LOWER EXTREMITY VENOUS (DVT) Result Date: 02/09/2023  Lower Venous DVT Study Patient Name:  Donald Rios  Date of Exam:   02/09/2023 Medical Rec #: [id number]       Accession #:    [id number] Date of Birth: 1940/07/13       Patient Gender: M Patient Age:   65 years Exam Location:  [facility] Procedure:      VAS [place] LOWER EXTREMITY VENOUS (DVT) Referring Phys: Merrilyn Puma --------------------------------------------------------------------------------  Indications: Swelling. Other Indications: Cellulitis. Comparison Study: No previous exams Performing Technologist: Jody Hill RVT, RDMS  Examination Guidelines: A complete evaluation includes B-mode imaging, spectral Doppler, color Doppler, and power Doppler as needed of all accessible  portions of each vessel. Bilateral testing is considered an integral part of a complete examination. Limited examinations for reoccurring indications may be performed as noted. The reflux portion of the exam is performed with the patient in reverse Trendelenburg.  +-----+---------------+---------+-----------+----------+--------------+ RIGHTCompressibilityPhasicitySpontaneityPropertiesThrombus Aging +-----+---------------+---------+-----------+----------+--------------+ CFV  Full           Yes      Yes                                 +-----+---------------+---------+-----------+----------+--------------+   +---------+---------------+---------+-----------+----------+--------------+ LEFT     CompressibilityPhasicitySpontaneityPropertiesThrombus Aging +---------+---------------+---------+-----------+----------+--------------+ CFV      Full           Yes      Yes                                 +---------+---------------+---------+-----------+----------+--------------+ SFJ      Full                                                        +---------+---------------+---------+-----------+----------+--------------+  FV Prox  Full           Yes      Yes                                 +---------+---------------+---------+-----------+----------+--------------+ FV Mid   Full           Yes      Yes                                 +---------+---------------+---------+-----------+----------+--------------+ FV DistalFull           Yes      Yes                                 +---------+---------------+---------+-----------+----------+--------------+ PFV      Full                                                        +---------+---------------+---------+-----------+----------+--------------+ POP      Full           Yes      Yes                                 +---------+---------------+---------+-----------+----------+--------------+ PTV      Full                                                         +---------+---------------+---------+-----------+----------+--------------+ PERO     Full                                                        +---------+---------------+---------+-----------+----------+--------------+    Summary: RIGHT: - No evidence of common femoral vein obstruction.   LEFT: - There is no evidence of deep vein thrombosis in the lower extremity.  - No cystic structure found in the popliteal fossa. Subcutaneous edema seen in area of calf and ankle.  *See table(s) above for measurements and observations. Electronically signed by Heath Lark on 02/09/2023 at 4:11:54 PM.    Final    - pertinent xrays, CT, MRI studies were reviewed and independently interpreted  Positive ROS: All other systems have been reviewed and were otherwise negative with the exception of those mentioned in the HPI and as above.  Physical Exam: General: Alert, no acute distress Psychiatric: Patient is competent for consent with normal mood and affect Lymphatic: No axillary or cervical lymphadenopathy Cardiovascular: No pedal edema Respiratory: No cyanosis, no use of accessory musculature GI: No organomegaly, abdomen is soft and non-tender    Images:  @ENCIMAGES @  Labs:  Lab Results  Component Value Date   HGBA1C 6.2 (H) 02/08/2023   HGBA1C 5.8 (H) 08/14/2022   HGBA1C 5.6 05/14/2022   ESRSEDRATE 2 12/28/2022  ESRSEDRATE 6 11/09/2022   ESRSEDRATE 2 09/06/2022   CRP 0.7 02/10/2023   CRP <3.0 12/28/2022   CRP <3.0 11/09/2022   LABURIC 4.6 04/11/2012   REPTSTATUS PENDING 02/08/2023   CULT  02/08/2023    NO GROWTH 2 DAYS Performed at [facility], [address], [place], [place] [address]     Lab Results  Component Value Date   ALBUMIN 2.5 (L) 02/10/2023   ALBUMIN 3.6 02/08/2023   ALBUMIN 3.8 12/02/2022   LABURIC 4.6 04/11/2012        Latest Ref Rng & Units 02/10/2023    4:22 AM 02/09/2023    9:03 AM 02/08/2023    4:24 PM   CBC EXTENDED  WBC 4.0 - 10.5 K/uL 21.9  28.0  31.2   RBC 4.22 - 5.81 MIL/uL 2.95  3.26  3.70   Hemoglobin 13.0 - 17.0 g/dL 60.4  54.0  98.1   HCT 39.0 - 52.0 % 31.4  35.0  40.1   Platelets 150 - 400 K/uL 107  149  152   NEUT# 1.7 - 7.7 K/uL 17.9   28.7   Lymph# 0.7 - 4.0 K/uL 2.4   1.1     Neurologic: Patient does not have protective sensation bilateral lower extremities.   MUSCULOSKELETAL:   Skin: Examination patient has ischemic gangrenous changes beneath the toes on the left foot.  There is ischemic ulcers to the left great toe and a ischemic ulcer on the dorsum of the left midfoot approximately 1 cm in diameter with healthy granulation tissue at the base.  Review of ankle-brachial indices shows an absent great toe pressure on the left.  Patient has no palpable pulse on the left.  He does have a palpable dorsalis pedis pulse on the right.  Assessment: Diabetes with associated peripheral vascular disease with ischemic ulcerations to the left forefoot and left midfoot.  Plan: Recommend vascular surgery consultation for possible endovascular evaluation on the left.  Thank you for the consult and the opportunity to see Mr. Donald Rekowski, MD [facility] [phone] 7:36 PM

## 2023-02-10 NOTE — Evaluation (Signed)
Physical Therapy Evaluation Patient Details Name: Donald Rios MRN: [id number] DOB: 1940/04/30 Today's Date: 02/10/2023  History of Present Illness  Patient is an 83 y/o male admitted 02/08/23 with chronic L LE wound now with signs of infection including redness, pain, hypotension and mild tachycardia with CBC noting leukocytosis to 31.2.  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  Clinical Impression  Patient presents with decreased mobility due to pain and limited weight bearing tolerance on L LE.  Patient also with decreased sensation on L LE compared to R.  Previously using wheelchair for mobility in the home, though did traverse the two steps at entry and walk to the car when needed.  States using cane mainly since it goes into smaller spaces better.  Patient ambulated to door and back with RW and maintaining heel weight on L foot due to pain with cues for walker use for off loading L LE.  Spouse in the room and supportive.  Feel he should be able to return home at d/c with family assist and follow up HHPT.         If plan is discharge home, recommend the following: A little help with walking and/or transfers;A little help with bathing/dressing/bathroom;Assistance with cooking/housework;Assist for transportation;Help with stairs or ramp for entrance   Can travel by private vehicle        Equipment Recommendations None recommended by PT  Recommendations for Other Services       Functional Status Assessment Patient has had a recent decline in their functional status and demonstrates the ability to make significant improvements in function in a reasonable and predictable amount of time.     Precautions / Restrictions Precautions Precautions: Fall Restrictions Weight Bearing Restrictions Per Provider Order: No Other Position/Activity Restrictions: however pt only  tolerates heel weight bearing on L foot      Mobility  Bed Mobility Overal bed mobility: Modified Independent                  Transfers Overall transfer level: Needs assistance Equipment used: Rolling walker (2 wheels) Transfers: Sit to/from Stand Sit to Stand: Contact guard assist           General transfer comment: for balance    Ambulation/Gait Ambulation/Gait assistance: Contact guard assist Gait Distance (Feet): 20 Feet Assistive device: Rolling walker (2 wheels) Gait Pattern/deviations: Step-to pattern, Decreased stride length, Antalgic, Decreased step length - right, Decreased stance time - left       General Gait Details: heel weight bearing with RW and cues for UE weight bearing to off load L foot; CGA for balance and IV  Stairs            Wheelchair Mobility     Tilt Bed    Modified Rankin (Stroke Patients Only)       Balance Overall balance assessment: Needs assistance   Sitting balance-Leahy Scale: Good     Standing balance support: Bilateral upper extremity supported Standing balance-Leahy Scale: Poor Standing balance comment: UE support due to limited weight bearing on L foot                             Pertinent Vitals/Pain Pain Assessment Pain Assessment: 0-10 Pain Score: 2  Pain Location: L foot with ambulation using heel weight bearing Pain Descriptors / Indicators: Aching Pain  Intervention(s): Monitored during session    Home Living Family/patient expects to be discharged to:: Private residence       Home Access: Stairs to enter Entrance Stairs-Rails: None (column) Entrance Stairs-Number of Steps: 2 steps to the sidewalk and walks with cane to car at times when going to doctor   Home Layout: One level;Laundry or work area in [employer] Equipment: Wheelchair - Web designer - single point Additional Comments: been sleeping in a Metallurgist    Prior Function                Mobility Comments: using wheelchair to get to bathroom past three months ADLs Comments: wife helping to sponge bathe     Extremity/Trunk Assessment   Upper Extremity Assessment Upper Extremity Assessment: Overall WFL for tasks assessed    Lower Extremity Assessment Lower Extremity Assessment: LLE deficits/detail LLE Deficits / Details: noted dressing on L foot and edema up to mid calf; states numbness up to mid calf on L only LLE Sensation: decreased light touch    Cervical / Trunk Assessment Cervical / Trunk Assessment: Normal  Communication   Communication Communication: No apparent difficulties  Cognition Arousal: Alert Behavior During Therapy: WFL for tasks assessed/performed Overall Cognitive Status: Within Functional Limits for tasks assessed                                          General Comments General comments (skin integrity, edema, etc.): wife in the room and supportive    Exercises     Assessment/Plan    PT Assessment Patient needs continued PT services  PT Problem List Decreased activity tolerance;Decreased mobility;Decreased balance;Decreased knowledge of use of DME;Pain       PT Treatment Interventions DME instruction;Gait training;Stair training;Functional mobility training;Patient/family education;Therapeutic activities;Therapeutic exercise;Balance training    PT Goals (Current goals can be found in the Care Plan section)  Acute Rehab PT Goals Patient Stated Goal: stop pain in foot PT Goal Formulation: With patient/family Time For Goal Achievement: 02/24/23 Potential to Achieve Goals: Good    Frequency Min 1X/week     Co-evaluation               AM-PAC PT "6 Clicks" Mobility  Outcome Measure Help needed turning from your back to your side while in a flat bed without using bedrails?: None Help needed moving from lying on your back to sitting on the side of a flat bed without using bedrails?: None Help needed moving  to and from a bed to a chair (including a wheelchair)?: None Help needed standing up from a chair using your arms (e.g., wheelchair or bedside chair)?: A Little Help needed to walk in hospital room?: A Little Help needed climbing 3-5 steps with a railing? : Total 6 Click Score: 19    End of Session Equipment Utilized During Treatment: Gait belt Activity Tolerance: Patient limited by pain Patient left: in bed;with call bell/phone within reach;with family/visitor present   PT Visit Diagnosis: Other abnormalities of gait and mobility (R26.89);Difficulty in walking, not elsewhere classified (R26.2);Pain Pain - part of body: Ankle and joints of foot    Time: 1610-9604 PT Time Calculation (min) (ACUTE ONLY): 27 min   Charges:   PT Evaluation $PT Eval Moderate Complexity: 1 Mod PT Treatments $Gait Training: 8-22 mins PT General Charges $$ ACUTE PT VISIT: 1 Visit  Sheran Lawless, PT Acute Rehabilitation Services Office:[phone] 02/10/2023   Elray Mcgregor 02/10/2023, 4:12 PM

## 2023-02-10 NOTE — Consult Note (Addendum)
WOC Nurse Consult Note: see WOC note for consult on foot 02/09/2023; did assess this wound in person  and performed wound care as previously prescribed  Reason for Consult: sacrum  Wound type: Stage 2 Pressure Injuries B buttocks (patient says because of his foot he has been laying around at home for months)  Pressure Injury POA: Yes Measurement: 2 separate areas R buttock superior 1 cm x 1 cm x 0.1 cm, distal 2 cm x 2 cm x 0.1 cm 100% pink moist; L buttock superior 0.5 cm x 0.5 cm x 0.1 cm distal 3 cm x 3 cm x 0.1 cm 100% pink and moist  Wound bed: as above  Drainage (amount, consistency, odor) minimal serosanguinous  Periwound: intact  Dressing procedure/placement/frequency: Clean buttocks wound with Vashe wound cleanser Hart Rochester [phone]), apply a layer of Xeroform gauze Hart Rochester [id number])  to wounds daily and secure with silicone foam.  Patient says his wife has been managing these wounds at home.   L foot dorsal aspect 2.5 cm x 1.5 cm x 0.5 cm 100% red moist with minimal serosanguinous drainage.  I did note ecchymosis/purple discoloration on bottom of foot to medial great toe and at the base of the great, 2nd and 3rd digits.  I placed Xeroform on this area and Vashe moistened gauze to dorsal wound as previously ordered. Noted that D. Thedore Mins has consulted Dr. Lajoyce Corners and ordered ABIs.   POC discussed with bedside nurse. WOC team will not follow. Re-consult if further needs arise.  Patient says he has seen wound care center in [place] for this wound as well.    Thank you,    Priscella Mann MSN, RN-BC, [employer] [phone]

## 2023-02-10 NOTE — Telephone Encounter (Signed)
Per Dr. Lajoyce Corners monitor for ABI results today. Possible add on surgery for tomorrow.

## 2023-02-11 ENCOUNTER — Ambulatory Visit (HOSPITAL_COMMUNITY): Payer: Medicare Other

## 2023-02-11 DIAGNOSIS — L0390 Cellulitis, unspecified: Secondary | ICD-10-CM | POA: Diagnosis not present

## 2023-02-11 DIAGNOSIS — A419 Sepsis, unspecified organism: Secondary | ICD-10-CM | POA: Diagnosis not present

## 2023-02-11 DIAGNOSIS — I70245 Atherosclerosis of native arteries of left leg with ulceration of other part of foot: Secondary | ICD-10-CM | POA: Diagnosis not present

## 2023-02-11 LAB — HEPARIN LEVEL (UNFRACTIONATED)
Heparin Unfractionated: >1.1 [IU]/mL — ABNORMAL HIGH (ref 0.30–0.70)
Heparin Unfractionated: >1.1 [IU]/mL — ABNORMAL HIGH (ref 0.30–0.70)

## 2023-02-11 LAB — BASIC METABOLIC PANEL
Anion gap: 7 (ref 5–15)
BUN: 12 mg/dL (ref 8–23)
CO2: 23 mmol/L (ref 22–32)
Calcium: 8.5 mg/dL — ABNORMAL LOW (ref 8.9–10.3)
Chloride: 108 mmol/L (ref 98–111)
Creatinine, Ser: 1 mg/dL (ref 0.61–1.24)
GFR, Estimated: >60 mL/min (ref >60)
Glucose, Bld: 105 mg/dL — ABNORMAL HIGH (ref 70–99)
Potassium: 4.1 mmol/L (ref 3.5–5.1)
Sodium: 138 mmol/L (ref 135–145)

## 2023-02-11 LAB — CBC WITH DIFFERENTIAL/PLATELET
Abs Immature Granulocytes: 0.31 10*3/uL — ABNORMAL HIGH (ref 0.00–0.07)
Basophils Absolute: 0.1 10*3/uL (ref 0.0–0.1)
Basophils Relative: 0 %
Eosinophils Absolute: 0.1 10*3/uL (ref 0.0–0.5)
Eosinophils Relative: 1 %
HCT: 30.6 % — ABNORMAL LOW (ref 39.0–52.0)
Hemoglobin: 10 g/dL — ABNORMAL LOW (ref 13.0–17.0)
Immature Granulocytes: 2 %
Lymphocytes Relative: 14 %
Lymphs Abs: 2.1 10*3/uL (ref 0.7–4.0)
MCH: 34.6 pg — ABNORMAL HIGH (ref 26.0–34.0)
MCHC: 32.7 g/dL (ref 30.0–36.0)
MCV: 105.9 fL — ABNORMAL HIGH (ref 80.0–100.0)
Monocytes Absolute: 0.7 10*3/uL (ref 0.1–1.0)
Monocytes Relative: 5 %
Neutro Abs: 11.7 10*3/uL — ABNORMAL HIGH (ref 1.7–7.7)
Neutrophils Relative %: 78 %
Platelets: 114 10*3/uL — ABNORMAL LOW (ref 150–400)
RBC: 2.89 MIL/uL — ABNORMAL LOW (ref 4.22–5.81)
RDW: 15.3 % (ref 11.5–15.5)
WBC: 15 10*3/uL — ABNORMAL HIGH (ref 4.0–10.5)
nRBC: 0.3 % — ABNORMAL HIGH (ref 0.0–0.2)

## 2023-02-11 LAB — PHOSPHORUS: Phosphorus: 3 mg/dL (ref 2.5–4.6)

## 2023-02-11 LAB — GLUCOSE, CAPILLARY
Glucose-Capillary: 101 mg/dL — ABNORMAL HIGH (ref 70–99)
Glucose-Capillary: 158 mg/dL — ABNORMAL HIGH (ref 70–99)
Glucose-Capillary: 255 mg/dL — ABNORMAL HIGH (ref 70–99)
Glucose-Capillary: 310 mg/dL — ABNORMAL HIGH (ref 70–99)

## 2023-02-11 LAB — APTT
aPTT: 30 s (ref 24–36)
aPTT: 52 s — ABNORMAL HIGH (ref 24–36)

## 2023-02-11 LAB — PROCALCITONIN: Procalcitonin: <0.1 ng/mL

## 2023-02-11 LAB — C-REACTIVE PROTEIN: CRP: 0.6 mg/dL (ref <1.0)

## 2023-02-11 LAB — MAGNESIUM: Magnesium: 2.3 mg/dL (ref 1.7–2.4)

## 2023-02-11 MED ORDER — METOPROLOL TARTRATE 25 MG PO TABS
25.0000 mg | ORAL_TABLET | Freq: Two times a day (BID) | ORAL | Status: DC
  Administered 2023-02-11: 25 mg via ORAL
  Filled 2023-02-11 (×2): qty 1

## 2023-02-11 MED ORDER — METOPROLOL TARTRATE 12.5 MG HALF TABLET
12.5000 mg | ORAL_TABLET | Freq: Two times a day (BID) | ORAL | Status: DC
  Administered 2023-02-11 – 2023-02-13 (×5): 12.5 mg via ORAL
  Filled 2023-02-11 (×5): qty 1

## 2023-02-11 MED ORDER — HEPARIN (PORCINE) 25000 UT/250ML-% IV SOLN
850.0000 [IU]/h | INTRAVENOUS | Status: DC
  Administered 2023-02-11: 950 [IU]/h via INTRAVENOUS
  Administered 2023-02-13: 850 [IU]/h via INTRAVENOUS
  Filled 2023-02-11 (×4): qty 250

## 2023-02-11 MED ORDER — PREDNISONE 20 MG PO TABS
20.0000 mg | ORAL_TABLET | Freq: Every day | ORAL | Status: DC
  Administered 2023-02-11: 10 mg via ORAL
  Administered 2023-02-12 – 2023-02-15 (×4): 20 mg via ORAL
  Filled 2023-02-11 (×6): qty 1

## 2023-02-11 NOTE — Progress Notes (Signed)
PHARMACY - ANTICOAGULATION CONSULT NOTE  Pharmacy Consult for Heparin  Indication: history of chronic Afib No Known Allergies  Patient Measurements: Height: 5\' 8"  (172.7 cm) Weight: 66.8 kg (147 lb 4.3 oz) IBW/kg (Calculated) : 68.4 Heparin Dosing Weight: 66.8 kg  Vital Signs: Temp: 98 F (36.7 C) (01/31 2058) Temp Source: Oral (01/31 2058) BP: 99/75 (01/31 1610) Pulse Rate: 102 (01/31 2058)  Labs: Recent Labs    02/09/23 0903 02/10/23 0422 02/11/23 0520 02/11/23 1042 02/11/23 2051  HGB 11.2* 10.1* 10.0*  --   --   HCT 35.0* 31.4* 30.6*  --   --   PLT 149* 107* 114*  --   --   APTT  --   --   --  30 52*  HEPARINUNFRC  --   --   --  >1.10* >1.10*  CREATININE 1.15 1.08 1.00  --   --     Estimated Creatinine Clearance: 54.7 mL/min (by C-G formula based on SCr of 1 mg/dL).   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   Vascular surgery consulted for lower extremity PAD, nonhealing left foot dorsal aspect ulcer for the last 2 month  and plans to do angiogram procedure on Monday 02/14/23. Pharmacy consulted 1/31 to stop Apixaban and switch to IV heparin infusion in anticipation of angiogram LLE.  Heparin level >1.10 (elevated due to effects from prior Apixaban dosing). aPTT 52 sec (subtherapeutic) on infusion at 950 units/hr. No issues with line or bleeding reported per RN.  Goal of Therapy:  Heparin level 0.3-0.7 units/ml aPTT 66-102 seconds Monitor platelets by anticoagulation protocol: Yes   Plan:  Increase heparin infusion to 1100 units/hr Will f/u 8hr aPTT  Thank you for allowing pharmacy to be part of this patients care team.  Christoper Fabian, PharmD, BCPS Please see amion for complete clinical pharmacist phone list 02/11/2023,10:06 PM

## 2023-02-11 NOTE — Plan of Care (Signed)
  Problem: Education: Goal: Ability to describe self-care measures that may prevent or decrease complications (Diabetes Survival Skills Education) will improve Outcome: Progressing Goal: Individualized Educational Video(s) Outcome: Progressing   Problem: Coping: Goal: Ability to adjust to condition or change in health will improve Outcome: Progressing   Problem: Fluid Volume: Goal: Ability to maintain a balanced intake and output will improve Outcome: Progressing   Problem: Health Behavior/Discharge Planning: Goal: Ability to identify and utilize available resources and services will improve Outcome: Progressing Goal: Ability to manage health-related needs will improve Outcome: Progressing   Problem: Metabolic: Goal: Ability to maintain appropriate glucose levels will improve Outcome: Progressing   Problem: Nutritional: Goal: Maintenance of adequate nutrition will improve Outcome: Progressing Goal: Progress toward achieving an optimal weight will improve Outcome: Progressing   Problem: Skin Integrity: Goal: Risk for impaired skin integrity will decrease Outcome: Progressing   Problem: Tissue Perfusion: Goal: Adequacy of tissue perfusion will improve Outcome: Progressing   Problem: Education: Goal: Knowledge of General Education information will improve Description: Including pain rating scale, medication(s)/side effects and non-pharmacologic comfort measures Outcome: Progressing   Problem: Health Behavior/Discharge Planning: Goal: Ability to manage health-related needs will improve Outcome: Progressing   Problem: Clinical Measurements: Goal: Ability to maintain clinical measurements within normal limits will improve Outcome: Progressing Goal: Will remain free from infection Outcome: Progressing Goal: Diagnostic test results will improve Outcome: Progressing Goal: Respiratory complications will improve Outcome: Progressing Goal: Cardiovascular complication will  be avoided Outcome: Progressing   Problem: Activity: Goal: Risk for activity intolerance will decrease Outcome: Progressing   Problem: Nutrition: Goal: Adequate nutrition will be maintained Outcome: Progressing   Problem: Coping: Goal: Level of anxiety will decrease Outcome: Progressing   Problem: Elimination: Goal: Will not experience complications related to bowel motility Outcome: Progressing Goal: Will not experience complications related to urinary retention Outcome: Progressing   Problem: Pain Managment: Goal: General experience of comfort will improve and/or be controlled Outcome: Progressing   Problem: Safety: Goal: Ability to remain free from injury will improve Outcome: Progressing   Problem: Skin Integrity: Goal: Risk for impaired skin integrity will decrease Outcome: Progressing   Problem: Clinical Measurements: Goal: Ability to avoid or minimize complications of infection will improve Outcome: Progressing   Problem: Skin Integrity: Goal: Skin integrity will improve Outcome: Progressing   Problem: Education: Goal: Knowledge of disease or condition will improve Outcome: Progressing Goal: Understanding of medication regimen will improve Outcome: Progressing Goal: Individualized Educational Video(s) Outcome: Progressing   Problem: Activity: Goal: Ability to tolerate increased activity will improve Outcome: Progressing   Problem: Cardiac: Goal: Ability to achieve and maintain adequate cardiopulmonary perfusion will improve Outcome: Progressing   Problem: Health Behavior/Discharge Planning: Goal: Ability to safely manage health-related needs after discharge will improve Outcome: Progressing

## 2023-02-11 NOTE — Progress Notes (Signed)
PROGRESS NOTE                                                                                                                                                                                                             Patient Demographics:    Donald Rios, is a 83 y.o. male, DOB - Apr 19, 1940, ZOX:[id number]  Outpatient Primary MD for the patient is [name], MD    LOS - 2  Admit date - 02/08/2023    Chief Complaint  Patient presents with   Leg Swelling       Brief Narrative (HPI from H&P)   83 year old gentleman with history of chronic A-fib on Eliquis, chronic systolic heart failure, coronary artery disease, type 2 diabetes on glipizide, GERD. Patient recently developed temporal arteritis and was on high-dose steroids and currently on tapering his steroids. While on high-dose steroids he started developing wound on his dorsum of the left foot about 2 months ago. Followed by wound care, multiple rounds of antibiotics. Recently treated with Augmentin. Continue to have more pain, redness so sent to ER from wound care center. At the emergency room initially hypotensive improved with IV fluids. He was in A-fib. Leukocyte count 31,000. Blood sugar 235. Creatinine 1.56, lactic acid 3.2. Blood cultures were collected. Started on broad-spectrum antibiotics.    Subjective:   Patient in bed, appears comfortable, denies any headache, no fever, no chest pain or pressure, no shortness of breath , no abdominal pain. No new focal weakness.    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 Outpatient antibiotic courses, currently on Rocephin and vancomycin, sepsis pathophysiology is improving, lower extremity venous duplex unremarkable, MRI done 12/28/2023 is also unremarkable however it is close to 65 weeks old, follow  cultures, ABI suggest bilateral PAD and lower extremity, orthopedics Dr. Lajoyce Corners on case and vascular surgery also will be called.  Continue wound care and keep leg elevated.  Thankfully is responding well to present antibiotics.Acute kidney injury: Resuscitated.  Improved.   Chronic A-fib with [place] vas 2 score of greater than 3: Rate controlled now.  Eliquis dose was reduced to half last night.  He will go back on regular doses.   Chronic systolic heart failure: Currently euvolemic.  He was given IV fluids overnight.  Known ejection fraction of 25%.  Holding losartan and Jardiance. On spironolactone and metoprolol today.  Coronary artery disease status post CABG: Stable.  On Eliquis and Zocor.  GERD: On PPI.  Continue.  Chronic oral steroid use.  Due to eye issue per family members, has been on it for close to 1 year according to the wife, we will place him on 10 mg for now as blood pressures are slightly soft.  Monitor.    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  Per wound care team.    Type 2 diabetes with hyperglycemia: Patient on sliding scale insulin and glipizide at home.  Will keep on insulin while in the hospital.  Lab Results  Component Value Date   HGBA1C 6.2 (H) 02/08/2023   CBG (last 3)  Recent Labs    02/10/23 1655 02/10/23 1950 02/11/23 0746  GLUCAP 256* 268* 101*         Condition - Fair  Family Communication  : Wife [name] [phone] on 02/10/2023  Code Status :  Full  Consults  :  Ortho  PUD Prophylaxis : PPI   Procedures  :     MRI of the left foot.  12/28/22 - 1. Mild subcutaneous edema anterior to the tibial tubercle could be due to cellulitis. No abscess or osteomyelitis is seen. 2. Distal patellar tendinosis.  Lower extremity venous duplex.  No DVT.  ABI. Right: Resting right ankle-brachial index indicates noncompressible right lower extremity arteries. The right toe-brachial index is abnormal. Left: Resting left ankle-brachial  index indicates noncompressible left lower extremity arteries. Unable to obtain left TBI secondary to absent pulse.      Disposition Plan  :    Status is: Inpatient   DVT Prophylaxis  :     apixaban (ELIQUIS) tablet 5 mg     Lab Results  Component Value Date   PLT 114 (L) 02/11/2023    Diet :  Diet Order             Diet Carb Modified Fluid consistency: Thin; Room service appropriate? Yes  Diet effective now                    Inpatient Medications  Scheduled Meds:  allopurinol  300 mg Oral QHS   amiodarone  200 mg Oral Daily   apixaban  5 mg Oral BID   insulin aspart  0-5 Units Subcutaneous QHS   insulin aspart  0-6 Units Subcutaneous TID WC   metoprolol tartrate  25 mg Oral BID   mirtazapine  15 mg Oral QHS   pantoprazole  40 mg Oral Daily   [START ON 02/12/2023] predniSONE  20 mg Oral Q breakfast   simvastatin  40 mg Oral QHS   Continuous Infusions:  cefTRIAXone (ROCEPHIN)  IV 1 g (02/10/23 0940)   vancomycin 1,000 mg (02/10/23 2139)   PRN Meds:.acetaminophen **OR** acetaminophen, oxyCODONE, senna-docusate    Objective:   Vitals:   02/10/23 2132 02/10/23 2341 02/11/23 0411 02/11/23 0748  BP: 112/71 (!) 115/59 118/63 (!) 97/58  Pulse: 95 90  81  Resp:  19 16 18   Temp:  98 F (36.7 C) 98 F (36.7 C) 98.1 F (36.7 C)  TempSrc:  Oral Oral Oral  SpO2:  99% 100%   Weight:      Height:        Wt Readings from Last 3 Encounters:  02/08/23 66.8 kg  01/27/23 66.9 kg  01/25/23 65.8 kg     Intake/Output Summary (Last 24 hours) at 02/11/2023 0801 Last data filed at 02/11/2023 0750 Gross per 24 hour  Intake --  Output 650 ml  Net -650 ml     Physical Exam  Awake Alert, No new F.N deficits, Normal affect [name].AT,PERRAL Supple Neck, No JVD,   Symmetrical Chest wall movement, Good air movement bilaterally, CTAB RRR,No Gallops,Rubs or new Murmurs,  +ve B.Sounds, Abd Soft, No tenderness,   L foot under bandage, picture from the day of  admission      Data Review:    Recent Labs  Lab 02/08/23 1624 02/09/23 0903 02/10/23 0422 02/11/23 0520  WBC 31.2* 28.0* 21.9* 15.0*  HGB 12.7* 11.2* 10.1* 10.0*  HCT 40.1 35.0* 31.4* 30.6*  PLT 152 149* 107* 114*  MCV 108.4* 107.4* 106.4* 105.9*  MCH 34.3* 34.4* 34.2* 34.6*  MCHC 31.7 32.0 32.2 32.7  RDW 15.3 15.4 15.3 15.3  LYMPHSABS 1.1  --  2.4 2.1  MONOABS 0.9  --  0.8 0.7  EOSABS 0.0  --  0.2 0.1  BASOSABS 0.1  --  0.1 0.1    Recent Labs  Lab 02/08/23 1624 02/08/23 1629 02/08/23 2055 02/08/23 2252 02/08/23 2300 02/09/23 0903 02/10/23 0422 02/11/23 0520  NA 142  --   --   --   --  140 139 138  K 4.1  --   --   --   --  3.5 4.2 4.1  CL 105  --   --   --   --  106 107 108  CO2 23  --   --   --   --  25 22 23   ANIONGAP 14  --   --   --   --  9 10 7   GLUCOSE 235*  --   --   --   --  87 100* 105*  BUN 18  --   --   --   --  14 12 12   CREATININE 1.56*  --   --   --   --  1.15 1.08 1.00  AST 26  --   --   --   --   --  15  --   ALT 30  --   --   --   --   --  20  --   ALKPHOS 79  --   --   --   --   --  64  --   BILITOT 0.9  --   --   --   --   --  0.8  --   ALBUMIN 3.6  --   --   --   --   --  2.5*  --   CRP  --   --   --   --   --   --  0.7 0.6  PROCALCITON  --   --   --   --   --   --  0.18  --   LATICACIDVEN  --  3.2* 2.2*  --  1.4  --   --   --   HGBA1C  --   --   --  6.2*  --   --   --   --   MG  --   --   --   --   --   --  2.2 2.3  PHOS  --   --   --   --   --   --   --  3.0  CALCIUM 9.4  --   --   --   --  8.8* 8.4* 8.5*      Recent Labs  Lab 02/08/23 1624 02/08/23 1629 02/08/23 2055 02/08/23 2252 02/08/23 2300 02/09/23 0903 02/10/23 0422 02/11/23 0520  CRP  --   --   --   --   --   --  0.7 0.6  PROCALCITON  --   --   --   --   --   --  0.18  --   LATICACIDVEN  --  3.2* 2.2*  --  1.4  --   --   --   HGBA1C  --   --   --  6.2*  --   --   --   --   MG  --   --   --   --   --   --  2.2 2.3  CALCIUM 9.4  --   --   --   --  8.8* 8.4* 8.5*     --------------------------------------------------------------------------------------------------------------- Lab Results  Component Value Date   CHOL 151 12/02/2022   HDL 54.60 12/02/2022   LDLCALC 76 12/02/2022   TRIG 101.0 12/02/2022   CHOLHDL 3 12/02/2022    Lab Results  Component Value Date   HGBA1C 6.2 (H) 02/08/2023   No results for input(s): "TSH", "T4TOTAL", "FREET4", "T3FREE", "THYROIDAB" in the last 72 hours. Recent Labs    02/09/23 0500 02/09/23 0903  VITAMINB12  --  399  FOLATE 13.3  --    ------------------------------------------------------------------------------------------------------------------ Cardiac Enzymes No results for input(s): "CKMB", "TROPONINI", "MYOGLOBIN" in the last 168 hours.  Invalid input(s): "CK"  Micro Results Recent Results (from the past 240 hours)  Blood culture (routine x 2)     Status: None (Preliminary result)   Collection Time: 02/08/23  3:53 PM   Specimen: BLOOD RIGHT ARM  Result Value Ref Range Status   Specimen Description BLOOD RIGHT ARM  Final   Special Requests   Final    BOTTLES DRAWN AEROBIC AND ANAEROBIC Blood Culture adequate volume   Culture   Final    NO GROWTH 2 DAYS Performed at [facility], [address], [place], [place] [address]    Report Status PENDING  Incomplete  Blood culture (routine x 2)     Status: None (Preliminary result)   Collection Time: 02/08/23  3:58 PM   Specimen: BLOOD LEFT ARM  Result Value Ref Range Status   Specimen Description BLOOD LEFT ARM  Final   Special Requests   Final    BOTTLES DRAWN AEROBIC AND ANAEROBIC Blood Culture adequate volume   Culture   Final    NO GROWTH 2 DAYS Performed at [facility], [address] [place], [place] [address]    Report Status PENDING  Incomplete  MRSA Next Gen by PCR, Nasal     Status: None   Collection Time: 02/10/23  5:47 AM   Specimen: Nasal Mucosa; Nasal Swab  Result Value Ref Range Status   MRSA by PCR Next Gen  NOT DETECTED NOT DETECTED Final    Comment: (NOTE) The GeneXpert MRSA Assay (FDA approved for NASAL specimens only), is one component of a comprehensive MRSA colonization surveillance program. It is not intended to diagnose MRSA infection nor to guide or monitor treatment for MRSA infections. Test performance is not FDA approved in patients less than 8 years old. Performed at [facility], [address], [place], [place] [address]  Radiology Reports VAS [place] ABI WITH/WO TBI Result Date: 02/10/2023  LOWER EXTREMITY DOPPLER STUDY Patient Name:  REYNARD CHRISTOFFERSEN  Date of Exam:   02/10/2023 Medical Rec #: [id number]       Accession #:    [id number] Date of Birth: Jun 14, 1940       Patient Gender: M Patient Age:   30 years Exam Location:  [facility] Procedure:      VAS [place] ABI WITH/WO TBI Referring Phys: Bess Harvest [facility] --------------------------------------------------------------------------------  Indications: Ulceration. Cellulitis, abscess, sepsis High Risk         Hypertension, hyperlipidemia, Diabetes, coronary artery Factors:          disease. Other Factors: Cardiomyopathy, CHF, history of CABG.  Limitations: Today's exam was limited due to rapid atrial fibrillation. Comparison Study: No prior study on file Performing Technologist: Sherren Kerns RVS  Examination Guidelines: A complete evaluation includes at minimum, Doppler waveform signals and systolic blood pressure reading at the level of bilateral brachial, anterior tibial, and posterior tibial arteries, when vessel segments are accessible. Bilateral testing is considered an integral part of a complete examination. Photoelectric Plethysmograph (PPG) waveforms and toe systolic pressure readings are included as required and additional duplex testing as needed. Limited examinations for reoccurring indications may be performed as noted.  ABI Findings: +---------+------------------+-----+--------+--------+ Right    Rt Pressure  (mmHg)IndexWaveformComment  +---------+------------------+-----+--------+--------+ Brachial 114                                     +---------+------------------+-----+--------+--------+ PTA      255               2.24 biphasic         +---------+------------------+-----+--------+--------+ DP       255               2.24 biphasic         +---------+------------------+-----+--------+--------+ Great Toe58                0.51                  +---------+------------------+-----+--------+--------+ +---------+------------------+-----+--------+-------+ Left     Lt Pressure (mmHg)IndexWaveformComment +---------+------------------+-----+--------+-------+ Brachial 113                                    +---------+------------------+-----+--------+-------+ PTA      255               2.24 biphasic        +---------+------------------+-----+--------+-------+ DP       255               2.24 biphasic        +---------+------------------+-----+--------+-------+ Great Toe0                 0.00 Absent          +---------+------------------+-----+--------+-------+ +-------+---------------------+-----------+------------+------------+ ABI/TBIToday's ABI          Today's TBIPrevious ABIPrevious TBI +-------+---------------------+-----------+------------+------------+ Right  2.24 non compressible0.51                                +-------+---------------------+-----------+------------+------------+ Left   2.24 non compressibleabsent                              +-------+---------------------+-----------+------------+------------+  Arterial wall calcification precludes accurate ankle pressures and ABIs.  Summary: Right: Resting right ankle-brachial index indicates noncompressible right lower extremity arteries. The right toe-brachial index is abnormal. Left: Resting left ankle-brachial index indicates noncompressible left lower extremity arteries. Unable to obtain  left TBI secondary to absent pulse. *See table(s) above for measurements and observations.  Electronically signed by Sherald Hess MD on 02/10/2023 at 1:02:44 PM.    Final    VAS [place] LOWER EXTREMITY VENOUS (DVT) Result Date: 02/09/2023  Lower Venous DVT Study Patient Name:  TALIESIN HARTLAGE  Date of Exam:   02/09/2023 Medical Rec #: [id number]       Accession #:    [id number] Date of Birth: [birth year]       Patient Gender: M Patient Age:   63 years Exam Location:  [facility] Procedure:      VAS [place] LOWER EXTREMITY VENOUS (DVT) Referring Phys: Merrilyn Puma --------------------------------------------------------------------------------  Indications: Swelling. Other Indications: Cellulitis. Comparison Study: No previous exams Performing Technologist: Jody Hill RVT, RDMS  Examination Guidelines: A complete evaluation includes B-mode imaging, spectral Doppler, color Doppler, and power Doppler as needed of all accessible portions of each vessel. Bilateral testing is considered an integral part of a complete examination. Limited examinations for reoccurring indications may be performed as noted. The reflux portion of the exam is performed with the patient in reverse Trendelenburg.  +-----+---------------+---------+-----------+----------+--------------+ RIGHTCompressibilityPhasicitySpontaneityPropertiesThrombus Aging +-----+---------------+---------+-----------+----------+--------------+ CFV  Full           Yes      Yes                                 +-----+---------------+---------+-----------+----------+--------------+   +---------+---------------+---------+-----------+----------+--------------+ LEFT     CompressibilityPhasicitySpontaneityPropertiesThrombus Aging +---------+---------------+---------+-----------+----------+--------------+ CFV      Full           Yes      Yes                                 +---------+---------------+---------+-----------+----------+--------------+  SFJ      Full                                                        +---------+---------------+---------+-----------+----------+--------------+ FV Prox  Full           Yes      Yes                                 +---------+---------------+---------+-----------+----------+--------------+ FV Mid   Full           Yes      Yes                                 +---------+---------------+---------+-----------+----------+--------------+ FV DistalFull           Yes      Yes                                 +---------+---------------+---------+-----------+----------+--------------+ PFV      Full                                                        +---------+---------------+---------+-----------+----------+--------------+  POP      Full           Yes      Yes                                 +---------+---------------+---------+-----------+----------+--------------+ PTV      Full                                                        +---------+---------------+---------+-----------+----------+--------------+ PERO     Full                                                        +---------+---------------+---------+-----------+----------+--------------+    Summary: RIGHT: - No evidence of common femoral vein obstruction.   LEFT: - There is no evidence of deep vein thrombosis in the lower extremity.  - No cystic structure found in the popliteal fossa. Subcutaneous edema seen in area of calf and ankle.  *See table(s) above for measurements and observations. Electronically signed by Heath Lark on 02/09/2023 at 4:11:54 PM.    Final    DG Foot Complete Left Result Date: 02/08/2023 CLINICAL DATA:  Rule out osteomyelitis.  Left foot wound. EXAM: LEFT FOOT - COMPLETE 3+ VIEW COMPARISON:  None Available. FINDINGS: There is a bandage along the dorsal aspect of the foot. Negative for acute fracture or dislocation. No evidence for cortical destruction or periosteal reaction.  Spurring involving the calcaneus. Atherosclerotic calcifications in left foot. IMPRESSION: 1. No acute bone abnormality to the left foot. 2. No radiographic evidence for osteomyelitis. 3. Atherosclerotic calcifications. Electronically Signed   By: Richarda Overlie M.D.   On: 02/08/2023 17:11      Signature  -   Susa Raring M.D on 02/11/2023 at 8:01 AM   -  To page go to [email or web address]

## 2023-02-11 NOTE — Progress Notes (Signed)
PHARMACY - ANTICOAGULATION CONSULT NOTE  Pharmacy Consult for Heparin  Indication: history of chronic Afib No Known Allergies  Patient Measurements: Height: 5\' 8"  (172.7 cm) Weight: 66.8 kg (147 lb 4.3 oz) IBW/kg (Calculated) : 68.4 Heparin Dosing Weight: 66.8 kg  Vital Signs: Temp: 98.3 F (36.8 C) (01/31 1151) Temp Source: Oral (01/31 1151) BP: 119/63 (01/31 1151) Pulse Rate: 91 (01/31 1151)  Labs: Recent Labs    02/09/23 0903 02/10/23 0422 02/11/23 0520 02/11/23 1042  HGB 11.2* 10.1* 10.0*  --   HCT 35.0* 31.4* 30.6*  --   PLT 149* 107* 114*  --   APTT  --   --   --  30  HEPARINUNFRC  --   --   --  >1.10*  CREATININE 1.15 1.08 1.00  --     Estimated Creatinine Clearance: 54.7 mL/min (by C-G formula based on SCr of 1 mg/dL).   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    History of MI (myocardial infarction)    History of nuclear stress test 08/07/2010   dipyridamole; EKG negative for ischemia, low risk scan    Hyperlipidemia    Hyperplastic colon polyp 05/2008   Hypertension    Ischemic cardiomyopathy    EF 45%, with inferior wall motion abnormality    Myocardial infarction (HCC)    Valvular regurgitation    mitral and tricuspid (mild)    Medications:  Medications Prior to Admission  Medication Sig Dispense Refill Last Dose/Taking   allopurinol (ZYLOPRIM) 300 MG tablet TAKE 1 TABLET BY MOUTH DAILY (Patient taking differently: Take 300 mg by mouth at bedtime.) 90 tablet 0 02/07/2023 Bedtime    amiodarone (PACERONE) 200 MG tablet Take 1 tablet by mouth twice daily for 14 days then decrease to 1 tablet once daily. 115 tablet 1 02/08/2023 Morning   amoxicillin-clavulanate (AUGMENTIN) 875-125 MG tablet Take 1 tablet by mouth 2 (two) times daily. 20 tablet 0 02/08/2023 Noon   apixaban (ELIQUIS) 5 MG TABS tablet Take 1 tablet (5 mg total) by mouth 2 (two) times daily. 60 tablet 5 02/08/2023 at  9:00 AM   doxazosin (CARDURA) 2 MG tablet TAKE 1 TABLET BY MOUTH DAILY (Patient taking differently: Take 2 mg by mouth at bedtime.) 90 tablet 3 02/07/2023 Bedtime   empagliflozin (JARDIANCE) 10 MG TABS tablet Take 1 tablet (10 mg total) by mouth daily. 30 tablet 2 02/08/2023 Morning   furosemide (LASIX) 40 MG tablet Take 1 tablet (40 mg total) by mouth 2 (two) times daily. 180 tablet 3 02/07/2023 Bedtime   glipiZIDE (GLIPIZIDE XL) 5 MG 24 hr tablet Take 1 tablet (5 mg total) by mouth daily with breakfast. 30 tablet 3 02/08/2023 Morning   insulin aspart (FIASP FLEXTOUCH) 100 UNIT/ML FlexTouch Pen Before each meal 3 times a day, 140-199 - 2 units, 200-250 - 4 units, 251-299 - 6 units,  300-349 - 8 units,  350 or above 10 units. (Patient taking differently:  Inject 3-7 Units into the skin as needed. Before each meal 3 times a day, 140-199 - 2 units, 200-250 - 4 units, 251-299 - 6 units,  300-349 - 8 units,  350 or above 10 units.) 9 mL 0 02/07/2023 Bedtime   losartan (COZAAR) 25 MG tablet Take 1 tablet (25 mg total) by mouth daily. 90 tablet 1 02/08/2023 Morning   metoprolol tartrate (LOPRESSOR) 50 MG tablet Take 2 tablets (100 mg total) by mouth 2 (two) times daily. 360 tablet 3 02/08/2023 Morning   mirtazapine (REMERON) 30 MG tablet TAKE 1 TABLET BY MOUTH EVERY NIGHT AT BEDTIME FOR APPETITE 90 tablet 1 02/07/2023 Bedtime   pantoprazole (PROTONIX) 40 MG tablet Take 1 tablet (40 mg total) by mouth daily. 30 tablet 3 02/08/2023 Morning   predniSONE (DELTASONE) 5 MG tablet Take 2.5 mg by mouth daily with breakfast.   02/08/2023  Morning   silver sulfADIAZINE (SILVADENE) 1 % cream Apply 1 Application topically daily.   02/07/2023 Bedtime   spironolactone (ALDACTONE) 25 MG tablet Take 1 tablet (25 mg total) by mouth 2 (two) times daily. 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   Vascular surgery consulted for lower extremity PAD, nonhealing left foot dorsal aspect ulcer for the last 2 month  and plans to do angiogram procedure on Monday 02/14/23. Pharmacy consulted 1/31 to stop Apixaban and switch to IV heparin infusion in anticipation of angiogram LLE.  Hgb 10,  Hcg 30.6, pltc 114 low /stable.  APTT  =30 seconds ,   HL = >1.10 (elevated due to effects from prior Apixaban dosing. We will use aPTT to monitor heparin until HL correlate with aPTT.      Goal of Therapy:  Heparin level 0.3-0.7 units/ml aPTT 66-102 seconds Monitor platelets by anticoagulation protocol: Yes   Plan:  Hold Apixaban Start IV heparin infusion 950 units/hr. Check 8 hour aPTT and heparin level.  Will use aPTT to monitor heparin until HL correlate with aPTT.  Daily aPTT, HL, CBC  Thank you for allowing pharmacy to be part of this patients care team. Noah Delaine, RPh Clinical Pharmacist 02/11/2023,12:51 PM  Please check AMION for all [facility] Pharmacy phone numbers After 10:00 PM, call Main Pharmacy [phone]

## 2023-02-11 NOTE — Progress Notes (Signed)
   02/11/23 1118  Mobility  Activity Stood at bedside  Level of Assistance Contact guard assist, steadying assist  Assistive Device Front wheel walker  Distance Ambulated (ft) 1 ft  Activity Response Tolerated fair  Mobility Referral Yes  Mobility visit 1 Mobility  Mobility Specialist Start Time (ACUTE ONLY) 1053  Mobility Specialist Stop Time (ACUTE ONLY) 1118  Mobility Specialist Time Calculation (min) (ACUTE ONLY) 25 min   Mobility Specialist: Progress Note  Pre-Mobility:      HR 97 Post-Mobility:    HR 92  Pt agreeable to mobility session - received in bed. C/o LLE pain rated 7/10. Returned to upright in bed with all needs met - call bell within reach. Bed alarm on. Further mobility deferred by pt.   Barnie Mort, BS Mobility Specialist Please contact via SecureChat or  Rehab office at [phone]. Marland Kitchen

## 2023-02-11 NOTE — Consult Note (Addendum)
Hospital Consult    Reason for Consult: left foot non healing wound, Abnormal ABI Requesting Physician:  Dr. Thedore Mins MRN #:  [id number]  History of Present Illness: This is a 83 y.o. male with past medical history of HTN, CKD II, atrial fibrillation on Eliquis, CHF, CAD s/p CABG, Type II DM, and HLD who presented with left foot infection. Vascular surgery was consulted due to non healing wound and abnormal ABI. He explains that he was hospitalized back in August for what sounds like management of GCA and was placed on a long course of steroids. He says following the course of steroids he had a lot of lower extremity edema and this subsequently turned into a wound on the dorsum of his left foot. He says his wound has been present for about 1 month now. He says it has not really worsened but that it has not been healing. He explains that he walks very little since the wound appeared. He has been using cane and walking on his heel. He says prior to the wound he was walking without much difficulty. He denies any real pain in his legs on rest or ambulation. He lives with his wife and says he is able to perform his ADL's independently. 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 He is a former smoker. His ABIs on admission show non compressible vessels bilaterally with absent toe pressure on the left and 0.51 on the right.    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    History of MI (myocardial infarction)    History of nuclear  stress test 08/07/2010   dipyridamole; EKG negative for ischemia, low risk scan    Hyperlipidemia    Hyperplastic colon polyp 05/2008   Hypertension    Ischemic cardiomyopathy    EF 45%, with inferior wall motion abnormality    Myocardial infarction (HCC)    Valvular regurgitation    mitral and tricuspid (mild)    Past Surgical History:  Procedure Laterality Date   CARDIOVERSION N/A 08/16/2022   Procedure: CARDIOVERSION;  Surgeon: Jake Bathe, MD;  Location: [facility] INVASIVE CV [facility];  Service: Cardiovascular;  Laterality: N/A;   CORONARY ARTERY BYPASS GRAFT  2000   LIMA to LAD, free IMA to OM2, sequential graft to PLA & PLD   ESOPHAGOGASTRODUODENOSCOPY N/A 05/07/2013   Procedure: ESOPHAGOGASTRODUODENOSCOPY (EGD);  Surgeon: Iva Boop, MD;  Location: [facility] ENDOSCOPY;  Service: Endoscopy;  Laterality: N/A;   TEE WITHOUT CARDIOVERSION N/A 08/16/2022   Procedure: TRANSESOPHAGEAL ECHOCARDIOGRAM;  Surgeon: Jake Bathe, MD;  Location: [facility] INVASIVE CV LAB;  Service: Cardiovascular;  Laterality: N/A;   TRANSTHORACIC ECHOCARDIOGRAM  09/07/2010   EF 45-50%; LV systolic function mildly reduced; LA mildly dilated; mild-mod MR & mild-mod TR; aortic root sclerosis/calcification;     No Known Allergies  Prior to Admission medications   Medication Sig Start Date End Date Taking?  Authorizing Provider  allopurinol (ZYLOPRIM) 300 MG tablet TAKE 1 TABLET BY MOUTH DAILY Patient taking differently: Take 300 mg by mouth at bedtime. 01/28/23  Yes [name], MD  amiodarone (PACERONE) 200 MG tablet Take 1 tablet by mouth twice daily for 14 days then decrease to 1 tablet once daily. 01/13/23  Yes Hilty, Lisette Abu, MD  amoxicillin-clavulanate (AUGMENTIN) 875-125 MG tablet Take 1 tablet by mouth 2 (two) times daily. 02/04/23  Yes [name], MD  apixaban (ELIQUIS) 5 MG TABS tablet Take 1 tablet (5 mg total) by mouth 2 (two) times daily. 09/20/22  Yes [name], MD  doxazosin (CARDURA) 2 MG tablet TAKE  1 TABLET BY MOUTH DAILY Patient taking differently: Take 2 mg by mouth at bedtime. 08/04/22  Yes [name], MD  empagliflozin (JARDIANCE) 10 MG TABS tablet Take 1 tablet (10 mg total) by mouth daily. 08/16/22  Yes Arty Baumgartner, NP  furosemide (LASIX) 40 MG tablet Take 1 tablet (40 mg total) by mouth 2 (two) times daily. 11/23/22  Yes [name], MD  glipiZIDE (GLIPIZIDE XL) 5 MG 24 hr tablet Take 1 tablet (5 mg total) by mouth daily with breakfast. 12/21/22  Yes Pickard, Priscille Heidelberg, MD  insulin aspart (FIASP FLEXTOUCH) 100 UNIT/ML FlexTouch Pen Before each meal 3 times a day, 140-199 - 2 units, 200-250 - 4 units, 251-299 - 6 units,  300-349 - 8 units,  350 or above 10 units. Patient taking differently: Inject 3-7 Units into the skin as needed. Before each meal 3 times a day, 140-199 - 2 units, 200-250 - 4 units, 251-299 - 6 units,  300-349 - 8 units,  350 or above 10 units. 08/16/22  Yes Leroy Sea, MD  losartan (COZAAR) 25 MG tablet Take 1 tablet (25 mg total) by mouth daily. 08/16/22  Yes Laverda Page B, NP  metoprolol tartrate (LOPRESSOR) 50 MG tablet Take 2 tablets (100 mg total) by mouth 2 (two) times daily. 11/25/22  Yes [name], MD  mirtazapine (REMERON) 30 MG tablet TAKE 1 TABLET BY MOUTH EVERY NIGHT AT BEDTIME FOR APPETITE 09/02/22  Yes [name], MD  pantoprazole (PROTONIX) 40 MG tablet Take 1 tablet (40 mg total) by mouth daily. 07/30/22  Yes [name], MD  predniSONE (DELTASONE) 5 MG tablet Take 2.5 mg by mouth daily with breakfast.   Yes [provider]  silver sulfADIAZINE (SILVADENE) 1 % cream Apply 1 Application topically daily. 02/02/23  Yes [provider]  spironolactone (ALDACTONE) 25 MG tablet Take 1 tablet (25 mg total) by mouth 2 (two) times daily. 11/23/22 02/21/23 Yes [name], MD  simvastatin (ZOCOR) 40 MG tablet TAKE ONE TABLET BY MOUTH DAILY Patient taking differently: Take 40 mg by mouth at bedtime.  05/17/22   [name], MD    Social History   Socioeconomic History   Marital status: Married    Spouse name: Not on file   Number of children: 1   Years of education: Not on file   Highest education level: Not on file  Occupational History   Not on file  Tobacco Use   Smoking status: Former    Current packs/day: 0.00    Types: Cigarettes    Quit date: 11/04/1985    Years since quitting: 37.2   Smokeless tobacco: Never  Vaping Use   Vaping status: Never Used  Substance and Sexual Activity   Alcohol use: Not Currently  Alcohol/week: 2.0 - 3.0 standard drinks of alcohol    Types: 2 - 3 Cans of beer per week   Drug use: No   Sexual activity: Not on file  Other Topics Concern   Not on file  Social History Narrative   Not on file   Social Drivers of Health   Financial Resource Strain: Low Risk  (08/24/2022)   Overall Financial Resource Strain (CARDIA)    Difficulty of Paying Living Expenses: Not hard at all  Food Insecurity: No Food Insecurity (02/09/2023)   Hunger Vital Sign    Worried About Running Out of Food in the Last Year: Never true    Ran Out of Food in the Last Year: Never true  Transportation Needs: No Transportation Needs (02/09/2023)   PRAPARE - Administrator, Civil Service (Medical): No    Lack of Transportation (Non-Medical): No  Physical Activity: Sufficiently Active (07/10/2021)   Exercise Vital Sign    Days of Exercise per Week: 5 days    Minutes of Exercise per Session: 30 min  Stress: No Stress Concern Present (08/24/2022)   [employer] of Occupational Health - Occupational Stress Questionnaire    Feeling of Stress : Only a little  Social Connections: Moderately Integrated (02/09/2023)   Social Connection and Isolation Panel [NHANES]    Frequency of Communication with Friends and Family: Twice a week    Frequency of Social Gatherings with Friends and Family: Twice a week    Attends Religious Services: More than 4 times per  year    Active Member of [employer] or Organizations: No    Attends Banker Meetings: Never    Marital Status: Married  Catering manager Violence: Not At Risk (02/09/2023)   Humiliation, Afraid, Rape, and Kick questionnaire    Fear of Current or Ex-Partner: No    Emotionally Abused: No    Physically Abused: No    Sexually Abused: No     Family History  Problem Relation Age of Onset   Stroke Father    Hypertension Father    Diabetes Brother    Esophageal cancer Brother    Stroke Maternal Grandmother    Stroke Maternal Grandfather    Stomach cancer Maternal Aunt    Colon cancer Neg Hx    Rectal cancer Neg Hx     ROS: Otherwise negative unless mentioned in HPI  Physical Examination  Vitals:   02/11/23 0411 02/11/23 0748  BP: 118/63 (!) 97/58  Pulse:  81  Resp: 16 18  Temp: 98 F (36.7 C) 98.1 F (36.7 C)  SpO2: 100%    Body mass index is 22.39 kg/m.  General:  very pleasant elderly male, WDWN in NAD Gait: Not observed HENT: WNL, normocephalic Pulmonary: normal non-labored breathing without wheezing Cardiac: regular Abdomen:  soft Vascular Exam/Pulses: 2+ femoral pulses bilaterally, no palpable distal pulses. Feet both warm and well perfused Extremities: without ischemic changes, without Gangrene , with cellulitis of left foot; with open wound on dorsum of left foot, left foot edematous  Musculoskeletal: no muscle wasting or atrophy  Neurologic: A&O X 3;  No focal weakness or paresthesias are detected; speech is fluent/normal Psychiatric:  The pt has Normal affect.   CBC    Component Value Date/Time   WBC 15.0 (H) 02/11/2023 0520   RBC 2.89 (L) 02/11/2023 0520   HGB 10.0 (L) 02/11/2023 0520   HGB 12.7 (L) 09/24/2022 1507   HCT 30.6 (L) 02/11/2023 0520   HCT 39.9 09/24/2022  1507   PLT 114 (L) 02/11/2023 0520   PLT 128 (L) 09/24/2022 1507   MCV 105.9 (H) 02/11/2023 0520   MCV 103 (H) 09/24/2022 1507   MCH 34.6 (H) 02/11/2023 0520   MCHC 32.7  02/11/2023 0520   RDW 15.3 02/11/2023 0520   RDW 16.1 (H) 09/24/2022 1507   LYMPHSABS 2.1 02/11/2023 0520   MONOABS 0.7 02/11/2023 0520   EOSABS 0.1 02/11/2023 0520   BASOSABS 0.1 02/11/2023 0520    BMET    Component Value Date/Time   NA 138 02/11/2023 0520   NA 142 08/30/2022 1436   K 4.1 02/11/2023 0520   CL 108 02/11/2023 0520   CO2 23 02/11/2023 0520   GLUCOSE 105 (H) 02/11/2023 0520   BUN 12 02/11/2023 0520   BUN 22 08/30/2022 1436   CREATININE 1.00 02/11/2023 0520   CREATININE 1.11 12/31/2022 1541   CALCIUM 8.5 (L) 02/11/2023 0520   GFRNONAA >60 02/11/2023 0520   GFRNONAA 62 09/13/2019 0907   GFRAA 72 09/13/2019 0907    COAGS: Lab Results  Component Value Date   INR 1.9 (H) 11/16/2022   INR 1.05 05/06/2013     Non-Invasive Vascular Imaging:   +-------+---------------------+-----------+------------+------------+  ABI/TBIToday's ABI          Today's TBIPrevious ABIPrevious TBI  +-------+---------------------+-----------+------------+------------+  Right 2.24 non compressible0.51                                 +-------+---------------------+-----------+------------+------------+  Left  2.24 non compressibleabsent                               +-------+---------------------+-----------+------------+------------+   Statin:  Yes.   Beta Blocker:  Yes.   Aspirin:  No. ACEI:  No. ARB:  Yes.   CCB use:  Yes Other antiplatelets/anticoagulants:  Yes.   Eliquis   ASSESSMENT/PLAN: This is a 83 y.o. male with non healing left foot wound with underlying PAD. Left foot wound has been present for approximately 1 month. He does not endorse and claudication or rest pain symptoms. On exam he has palpable femoral pulses but no distal pulses palpable. His ABI shows non compressible vessels bilaterally with absent toe pressure on the left and 0.51 toe pressure on the right. I discussed recommendation for Angiogram of the left lower extremity to evaluation his  lower extremity perfusion. 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 I discussed risks/ benefits of procedure including bleeding, arterial injury or dissection, thrombosis, or need for surgical intervention. He is agreeable to proceed. He unfortunately had eaten this morning and was on Xarelto as of this morning. He has since been transitioned to heparin IV. The plan will be for Aortogram, Arteriogram of the left lower extremity on Monday 2/3 with Dr. Hetty Blend. Dr. Randie Heinz who is the on call surgeon will see him also to provide further management plans.   Graceann Congress PA-C [facility] [phone] 02/11/2023  8:48 AM  I have independently interviewed and examined patient and agree with PA assessment and plan above.  Noninvasive studies reviewed will order left lower extremity arterial duplex as well.  He has bilateral common femoral pulses suspect he has SFA possible tibial disease.  Will need to hold Xarelto and plan for angiography on Monday with myself or Dr. Hetty Blend.  This was discussed with patient and he demonstrates good understanding.  Jw Covin C. Randie Heinz, MD [facility] [place] Office: [phone] Pager: [phone]

## 2023-02-11 NOTE — Evaluation (Signed)
Occupational Therapy Evaluation Patient Details Name: Donald Rios MRN: [id number] DOB: 05-25-1940 Today's Date: 02/11/2023   History of Present Illness Patient is an 83 y/o male admitted 02/08/23 with chronic L LE wound now with signs of infection including redness, pain, hypotension and mild tachycardia with CBC noting leukocytosis to 31.2.  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   Clinical Impression   Pt admitted for above, reports little foot pain in LLE with mobility, performed ambulation ~56ft with RW + CGA but WB mostly through heel with LLE. Allowed pt to attempted hop to gait pattern if he desired no pressure on L foot, he preferred heel WB. Pt did need Max A to put on socks, LLE dressing too bulky, discussed wearing a shoe on opposing foot if they give him a post op shoe for the LLE. HR in constant afib when OOB with it going to 122 bpm. OT to continue following pt acutely  to address listed deficits and help transition to next level of care. Patient would benefit from post acute Home OT services to help maximize functional independence in natural environment        If plan is discharge home, recommend the following: A little help with bathing/dressing/bathroom;Help with stairs or ramp for entrance    Functional Status Assessment  Patient has had a recent decline in their functional status and demonstrates the ability to make significant improvements in function in a reasonable and predictable amount of time.  Equipment Recommendations  Other (comment) (pt has rec DME)    Recommendations for Other Services       Precautions / Restrictions Precautions Precautions: Fall Restrictions Weight Bearing Restrictions Per Provider Order: No Other Position/Activity Restrictions: however pt only tolerates heel weight bearing on L foot      Mobility Bed  Mobility Overal bed mobility: Modified Independent                  Transfers Overall transfer level: Needs assistance Equipment used: Rolling walker (2 wheels) Transfers: Sit to/from Stand Sit to Stand: Contact guard assist           General transfer comment: cues needed for proper hand placement with push off      Balance Overall balance assessment: Needs assistance Sitting-balance support: Feet supported, No upper extremity supported Sitting balance-Leahy Scale: Good     Standing balance support: Bilateral upper extremity supported Standing balance-Leahy Scale: Poor                             ADL either performed or assessed with clinical judgement   ADL Overall ADL's : Needs assistance/impaired Eating/Feeding: Independent;Sitting   Grooming: Sitting;Set up   Upper Body Bathing: Sitting;Set up   Lower Body Bathing: Sitting/lateral leans;Minimal assistance   Upper Body Dressing : Sitting;Set up   Lower Body Dressing: Maximal assistance;Sitting/lateral leans Lower Body Dressing Details (indicate cue type and reason): doff/don bilat socks. also limited by bulky LLE dressing Toilet Transfer: Contact guard assist;Ambulation;Rolling walker (2 wheels)   Toileting- Clothing Manipulation and Hygiene: Sit to/from stand;Contact guard assist       Functional mobility during ADLs: Contact guard assist;Rolling walker (2 wheels)       Vision         Perception         Praxis  Pertinent Vitals/Pain Pain Assessment Pain Assessment: 0-10 Pain Score: 2  Pain Location: L foot with ambulation using heel weight bearing Pain Descriptors / Indicators: Aching Pain Intervention(s): Monitored during session     Extremity/Trunk Assessment Upper Extremity Assessment Upper Extremity Assessment: Overall WFL for tasks assessed   Lower Extremity Assessment LLE Deficits / Details: noted dressing on L foot and edema up to mid calf; states  numbness up to mid calf on L only LLE Sensation: decreased light touch   Cervical / Trunk Assessment Cervical / Trunk Assessment: Normal   Communication Communication Communication: No apparent difficulties   Cognition Arousal: Alert Behavior During Therapy: WFL for tasks assessed/performed Overall Cognitive Status: Within Functional Limits for tasks assessed                                       General Comments       Exercises     Shoulder Instructions      Home Living Family/patient expects to be discharged to:: Private residence Living Arrangements: Spouse/significant other Available Help at Discharge: Family;Available 24 hours/day Type of Home: House Home Access: Stairs to enter [employer] of Steps: 2 steps to the sidewalk and walks with cane to car at times when going to doctor Entrance Stairs-Rails: None (column) Home Layout: One level;Laundry or work area in [employer] Shower/Tub: Sponge bathes at baseline (has both tub and Tourist information centre manager)   Firefighter: Standard     Home Equipment: Wheelchair - manual;Cane - single point;Other (comment);BSC/3in1;Lift chair (lift recliner)   Additional Comments: WOCN told him not to shower      Prior Functioning/Environment Prior Level of Function : Independent/Modified Independent;Driving             Mobility Comments: using wheelchair to get to bathroom past three months ADLs Comments: wife helping to sponge bathe and dress (setup assist)        OT Problem List: Impaired balance (sitting and/or standing);Pain      OT Treatment/Interventions: DME and/or AE instruction;Self-care/ADL training;Therapeutic activities;Balance training;Therapeutic exercise;Patient/family education    OT Goals(Current goals can be found in the care plan section) Acute Rehab OT Goals Patient Stated Goal: To go home, get foot better OT Goal Formulation: With patient Time For Goal Achievement:  02/25/23 Potential to Achieve Goals: Good ADL Goals Pt Will Perform Grooming: with supervision;standing Pt Will Perform Lower Body Bathing: with set-up;sitting/lateral leans Pt Will Perform Lower Body Dressing: with set-up;sitting/lateral leans Pt Will Transfer to Toilet: with supervision;ambulating  OT Frequency: Min 1X/week    Co-evaluation              AM-PAC OT "6 Clicks" Daily Activity     Outcome Measure Help from another person eating meals?: None Help from another person taking care of personal grooming?: A Little Help from another person toileting, which includes using toliet, bedpan, or urinal?: A Little Help from another person bathing (including washing, rinsing, drying)?: A Little Help from another person to put on and taking off regular upper body clothing?: A Little Help from another person to put on and taking off regular lower body clothing?: A Lot 6 Click Score: 18   End of Session Equipment Utilized During Treatment: Gait belt;Rolling walker (2 wheels) Nurse Communication: Mobility status  Activity Tolerance: Patient tolerated treatment well Patient left: in bed;with call bell/phone within reach;with bed alarm set;with family/visitor present  OT  Visit Diagnosis: Unsteadiness on feet (R26.81);Pain;Other abnormalities of gait and mobility (R26.89) Pain - Right/Left: Left Pain - part of body: Ankle and joints of foot                Time: 1543-1611 OT Time Calculation (min): 28 min Charges:  OT General Charges $OT Visit: 1 Visit OT Evaluation $OT Eval Low Complexity: 1 Low OT Treatments $Therapeutic Activity: 8-22 mins  02/11/2023  AB, OTR/L  Acute Rehabilitation Services  Office: [phone]   Tristan Schroeder 02/11/2023, 5:41 PM

## 2023-02-11 NOTE — Progress Notes (Signed)
Hypotension secondary to sepsis Chronic atrial fibrillation Patient's RN reported that patient's blood pressure is soft 99/75 and heart rate 103.  Per chart review patient has chronic atrial fibrillation on metoprolol 25 mg daily.  Decreasing the dose of metoprolol to 12.5 mg from tonight however once patient blood pressure will be improved up can go back to the previous dose.   Tereasa Coop, MD [facility] 02/11/2023, 8:59 PM

## 2023-02-12 ENCOUNTER — Inpatient Hospital Stay (HOSPITAL_COMMUNITY): Payer: Medicare Other

## 2023-02-12 DIAGNOSIS — L0390 Cellulitis, unspecified: Secondary | ICD-10-CM | POA: Diagnosis not present

## 2023-02-12 DIAGNOSIS — I7090 Unspecified atherosclerosis: Secondary | ICD-10-CM

## 2023-02-12 DIAGNOSIS — A419 Sepsis, unspecified organism: Secondary | ICD-10-CM | POA: Diagnosis not present

## 2023-02-12 LAB — BASIC METABOLIC PANEL
Anion gap: 7 (ref 5–15)
BUN: 14 mg/dL (ref 8–23)
CO2: 21 mmol/L — ABNORMAL LOW (ref 22–32)
Calcium: 8.4 mg/dL — ABNORMAL LOW (ref 8.9–10.3)
Chloride: 110 mmol/L (ref 98–111)
Creatinine, Ser: 1.01 mg/dL (ref 0.61–1.24)
GFR, Estimated: >60 mL/min (ref >60)
Glucose, Bld: 162 mg/dL — ABNORMAL HIGH (ref 70–99)
Potassium: 3.9 mmol/L (ref 3.5–5.1)
Sodium: 138 mmol/L (ref 135–145)

## 2023-02-12 LAB — GLUCOSE, CAPILLARY
Glucose-Capillary: 169 mg/dL — ABNORMAL HIGH (ref 70–99)
Glucose-Capillary: 146 mg/dL — ABNORMAL HIGH (ref 70–99)
Glucose-Capillary: 329 mg/dL — ABNORMAL HIGH (ref 70–99)
Glucose-Capillary: 290 mg/dL — ABNORMAL HIGH (ref 70–99)

## 2023-02-12 LAB — CBC WITH DIFFERENTIAL/PLATELET
Abs Immature Granulocytes: 0.34 10*3/uL — ABNORMAL HIGH (ref 0.00–0.07)
Basophils Absolute: 0 10*3/uL (ref 0.0–0.1)
Basophils Relative: 0 %
Eosinophils Absolute: 0.1 10*3/uL (ref 0.0–0.5)
Eosinophils Relative: 0 %
HCT: 29.8 % — ABNORMAL LOW (ref 39.0–52.0)
Hemoglobin: 9.8 g/dL — ABNORMAL LOW (ref 13.0–17.0)
Immature Granulocytes: 3 %
Lymphocytes Relative: 13 %
Lymphs Abs: 1.7 10*3/uL (ref 0.7–4.0)
MCH: 34.9 pg — ABNORMAL HIGH (ref 26.0–34.0)
MCHC: 32.9 g/dL (ref 30.0–36.0)
MCV: 106 fL — ABNORMAL HIGH (ref 80.0–100.0)
Monocytes Absolute: 0.8 10*3/uL (ref 0.1–1.0)
Monocytes Relative: 6 %
Neutro Abs: 10.7 10*3/uL — ABNORMAL HIGH (ref 1.7–7.7)
Neutrophils Relative %: 78 %
Platelets: 131 10*3/uL — ABNORMAL LOW (ref 150–400)
RBC: 2.81 MIL/uL — ABNORMAL LOW (ref 4.22–5.81)
RDW: 15.1 % (ref 11.5–15.5)
WBC: 13.6 10*3/uL — ABNORMAL HIGH (ref 4.0–10.5)
nRBC: 0.4 % — ABNORMAL HIGH (ref 0.0–0.2)

## 2023-02-12 LAB — PROCALCITONIN: Procalcitonin: <0.1 ng/mL

## 2023-02-12 LAB — APTT
aPTT: 124 s — ABNORMAL HIGH (ref 24–36)
aPTT: 116 s — ABNORMAL HIGH (ref 24–36)

## 2023-02-12 LAB — MAGNESIUM: Magnesium: 2.1 mg/dL (ref 1.7–2.4)

## 2023-02-12 LAB — PHOSPHORUS: Phosphorus: 3.1 mg/dL (ref 2.5–4.6)

## 2023-02-12 LAB — HEPARIN LEVEL (UNFRACTIONATED): Heparin Unfractionated: >1.1 [IU]/mL — ABNORMAL HIGH (ref 0.30–0.70)

## 2023-02-12 LAB — C-REACTIVE PROTEIN: CRP: 0.5 mg/dL (ref <1.0)

## 2023-02-12 NOTE — Progress Notes (Signed)
PROGRESS NOTE                                                                                                                                                                                                             Patient Demographics:    Donald Rios, is a 83 y.o. male, DOB - [birth year], WUJ:[id number]  Outpatient Primary MD for the patient is [name], MD    LOS - 3  Admit date - 02/08/2023    Chief Complaint  Patient presents with   Leg Swelling       Brief Narrative (HPI from H&P)   83 year old gentleman with history of chronic A-fib on Eliquis, chronic systolic heart failure, coronary artery disease, type 2 diabetes on glipizide, GERD. Patient recently developed temporal arteritis and was on high-dose steroids and currently on tapering his steroids. While on high-dose steroids he started developing wound on his dorsum of the left foot about 2 months ago. Followed by wound care, multiple rounds of antibiotics. Recently treated with Augmentin. Continue to have more pain, redness so sent to ER from wound care center. At the emergency room initially hypotensive improved with IV fluids. He was in A-fib. Leukocyte count 31,000. Blood sugar 235. Creatinine 1.56, lactic acid 3.2. Blood cultures were collected. Started on broad-spectrum antibiotics.    Subjective:   Patient in bed, appears comfortable, denies any headache, no fever, no chest pain or pressure, no shortness of breath , no abdominal pain. No new focal weakness.    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 Outpatient antibiotic courses, currently on Rocephin and vancomycin, sepsis pathophysiology is improving, lower extremity venous duplex unremarkable, MRI done 12/28/2023 is also unremarkable however it is close to 23 weeks old, follow  cultures, ABI suggest bilateral PAD and lower extremity, orthopedics Dr. Lajoyce Corners on case and vascular surgery also consulted due for angiogram on 02/14/2023. Continue wound care and keep leg elevated.  Thankfully is responding well to present antibiotics.  Acute kidney injury: Resuscitated.  Improved.   Chronic A-fib with [place] vas 2 score of greater than 3: Rate controlled now.  Eliquis dose was reduced to half last night.  He will go back on regular doses.   Chronic systolic  heart failure: Currently euvolemic.  He was given IV fluids overnight.  Known ejection fraction of 25%.  Holding losartan and Jardiance. On spironolactone and metoprolol today.  PAD/coronary artery disease status post CABG: Stable.  On Eliquis/Hep gtt and Zocor.  GERD: On PPI.  Continue.  Chronic oral steroid use.  Due to eye issue per family members, has been on it for close to 1 year according to the wife, we will place him on 10 mg for now as blood pressures are slightly soft.  Monitor.    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  Per wound care team.    Type 2 diabetes with hyperglycemia: Patient on sliding scale insulin and glipizide at home.  Will keep on insulin while in the hospital.  Lab Results  Component Value Date   HGBA1C 6.2 (H) 02/08/2023   CBG (last 3)  Recent Labs    02/11/23 1633 02/11/23 1942 02/12/23 0831  GLUCAP 255* 310* 169*         Condition - Fair  Family Communication  : Wife Donald Rios [phone] on 02/10/2023  Code Status :  Full  Consults  :  Ortho  PUD Prophylaxis : PPI   Procedures  :     MRI of the left foot.  12/28/22 - 1. Mild subcutaneous edema anterior to the tibial tubercle could be due to cellulitis. No abscess or osteomyelitis is seen. 2. Distal patellar tendinosis.  Lower extremity venous duplex.  No DVT.  ABI. Right: Resting right ankle-brachial index indicates noncompressible right lower extremity arteries. The right toe-brachial index is  abnormal. Left: Resting left ankle-brachial index indicates noncompressible left lower extremity arteries. Unable to obtain left TBI secondary to absent pulse.      Disposition Plan  :    Status is: Inpatient   DVT Prophylaxis  :  Hep gtt/ Eliquis    Lab Results  Component Value Date   PLT 131 (L) 02/12/2023    Diet :  Diet Order             Diet Carb Modified Fluid consistency: Thin; Room service appropriate? Yes  Diet effective now                    Inpatient Medications  Scheduled Meds:  allopurinol  300 mg Oral QHS   amiodarone  200 mg Oral Daily   insulin aspart  0-5 Units Subcutaneous QHS   insulin aspart  0-6 Units Subcutaneous TID WC   metoprolol tartrate  12.5 mg Oral BID   mirtazapine  15 mg Oral QHS   pantoprazole  40 mg Oral Daily   predniSONE  20 mg Oral Q breakfast   simvastatin  40 mg Oral QHS   Continuous Infusions:  cefTRIAXone (ROCEPHIN)  IV 1 g (02/12/23 0913)   heparin 1,100 Units/hr (02/11/23 2305)   vancomycin Stopped (02/12/23 0004)   PRN Meds:.acetaminophen **OR** acetaminophen, oxyCODONE, senna-docusate    Objective:   Vitals:   02/11/23 2216 02/11/23 2305 02/12/23 0433 02/12/23 0824  BP: 110/65 104/69 (!) 131/58 128/65  Pulse: (!) 112 93 (!) 110 (!) 111  Resp: (!) 22 19 (!) 24 17  Temp:  97.7 F (36.5 C)  98 F (36.7 C)  TempSrc:  Oral Oral Oral  SpO2:  95% 97% 98%  Weight:      Height:        Wt Readings from Last 3 Encounters:  02/08/23 66.8 kg  01/27/23 66.9 kg  01/25/23 65.8 kg  Intake/Output Summary (Last 24 hours) at 02/12/2023 1031 Last data filed at 02/12/2023 0645 Gross per 24 hour  Intake 240 ml  Output 1200 ml  Net -960 ml     Physical Exam  Awake Alert, No new F.N deficits, Normal affect [place].AT,PERRAL Supple Neck, No JVD,   Symmetrical Chest wall movement, Good air movement bilaterally, CTAB RRR,No Gallops,Rubs or new Murmurs,  +ve B.Sounds, Abd Soft, No tenderness,   L foot under bandage,  picture from the day of admission      Data Review:    Recent Labs  Lab 02/08/23 1624 02/09/23 0903 02/10/23 0422 02/11/23 0520 02/12/23 0520  WBC 31.2* 28.0* 21.9* 15.0* 13.6*  HGB 12.7* 11.2* 10.1* 10.0* 9.8*  HCT 40.1 35.0* 31.4* 30.6* 29.8*  PLT 152 149* 107* 114* 131*  MCV 108.4* 107.4* 106.4* 105.9* 106.0*  MCH 34.3* 34.4* 34.2* 34.6* 34.9*  MCHC 31.7 32.0 32.2 32.7 32.9  RDW 15.3 15.4 15.3 15.3 15.1  LYMPHSABS 1.1  --  2.4 2.1 1.7  MONOABS 0.9  --  0.8 0.7 0.8  EOSABS 0.0  --  0.2 0.1 0.1  BASOSABS 0.1  --  0.1 0.1 0.0    Recent Labs  Lab 02/08/23 1624 02/08/23 1629 02/08/23 2055 02/08/23 2252 02/08/23 2300 02/09/23 0903 02/10/23 0422 02/11/23 0520 02/12/23 0520  NA 142  --   --   --   --  140 139 138 138  K 4.1  --   --   --   --  3.5 4.2 4.1 3.9  CL 105  --   --   --   --  106 107 108 110  CO2 23  --   --   --   --  25 22 23  21*  ANIONGAP 14  --   --   --   --  9 10 7 7   GLUCOSE 235*  --   --   --   --  87 100* 105* 162*  BUN 18  --   --   --   --  14 12 12 14   CREATININE 1.56*  --   --   --   --  1.15 1.08 1.00 1.01  AST 26  --   --   --   --   --  15  --   --   ALT 30  --   --   --   --   --  20  --   --   ALKPHOS 79  --   --   --   --   --  64  --   --   BILITOT 0.9  --   --   --   --   --  0.8  --   --   ALBUMIN 3.6  --   --   --   --   --  2.5*  --   --   CRP  --   --   --   --   --   --  0.7 0.6 0.5  PROCALCITON  --   --   --   --   --   --  0.18 <0.10 <0.10  LATICACIDVEN  --  3.2* 2.2*  --  1.4  --   --   --   --   HGBA1C  --   --   --  6.2*  --   --   --   --   --  MG  --   --   --   --   --   --  2.2 2.3 2.1  PHOS  --   --   --   --   --   --   --  3.0 3.1  CALCIUM 9.4  --   --   --   --  8.8* 8.4* 8.5* 8.4*      Recent Labs  Lab 02/08/23 1624 02/08/23 1629 02/08/23 2055 02/08/23 2252 02/08/23 2300 02/09/23 0903 02/10/23 0422 02/11/23 0520 02/12/23 0520  CRP  --   --   --   --   --   --  0.7 0.6 0.5  PROCALCITON  --   --    --   --   --   --  0.18 <0.10 <0.10  LATICACIDVEN  --  3.2* 2.2*  --  1.4  --   --   --   --   HGBA1C  --   --   --  6.2*  --   --   --   --   --   MG  --   --   --   --   --   --  2.2 2.3 2.1  CALCIUM 9.4  --   --   --   --  8.8* 8.4* 8.5* 8.4*    --------------------------------------------------------------------------------------------------------------- Lab Results  Component Value Date   CHOL 151 12/02/2022   HDL 54.60 12/02/2022   LDLCALC 76 12/02/2022   TRIG 101.0 12/02/2022   CHOLHDL 3 12/02/2022    Lab Results  Component Value Date   HGBA1C 6.2 (H) 02/08/2023   No results for input(s): "TSH", "T4TOTAL", "FREET4", "T3FREE", "THYROIDAB" in the last 72 hours. No results for input(s): "VITAMINB12", "FOLATE", "FERRITIN", "TIBC", "IRON", "RETICCTPCT" in the last 72 hours.  ------------------------------------------------------------------------------------------------------------------ Cardiac Enzymes No results for input(s): "CKMB", "TROPONINI", "MYOGLOBIN" in the last 168 hours.  Invalid input(s): "CK"  Micro Results Recent Results (from the past 240 hours)  Blood culture (routine x 2)     Status: None (Preliminary result)   Collection Time: 02/08/23  3:53 PM   Specimen: BLOOD RIGHT ARM  Result Value Ref Range Status   Specimen Description BLOOD RIGHT ARM  Final   Special Requests   Final    BOTTLES DRAWN AEROBIC AND ANAEROBIC Blood Culture adequate volume   Culture   Final    NO GROWTH 4 DAYS Performed at [facility], [address], [place], [place] [address]    Report Status PENDING  Incomplete  Blood culture (routine x 2)     Status: None (Preliminary result)   Collection Time: 02/08/23  3:58 PM   Specimen: BLOOD LEFT ARM  Result Value Ref Range Status   Specimen Description BLOOD LEFT ARM  Final   Special Requests   Final    BOTTLES DRAWN AEROBIC AND ANAEROBIC Blood Culture adequate volume   Culture   Final    NO GROWTH 4 DAYS Performed at [facility], [address], [place], [place] [address]    Report Status PENDING  Incomplete  MRSA Next Gen by PCR, Nasal     Status: None   Collection Time: 02/10/23  5:47 AM   Specimen: Nasal Mucosa; Nasal Swab  Result Value Ref Range Status   MRSA by PCR Next Gen NOT DETECTED NOT DETECTED Final    Comment: (NOTE) The GeneXpert MRSA Assay (FDA approved for NASAL specimens only), is one component  of a comprehensive MRSA colonization surveillance program. It is not intended to diagnose MRSA infection nor to guide or monitor treatment for MRSA infections. Test performance is not FDA approved in patients less than 68 years old. Performed at [facility], [address] [place], [place] [address]     Radiology Reports VAS [place] ABI WITH/WO TBI Result Date: 02/10/2023  LOWER EXTREMITY DOPPLER STUDY Patient Name:  JAMEZ AMBROCIO  Date of Exam:   02/10/2023 Medical Rec #: [id number]       Accession #:    [id number] Date of Birth: [birth year]       Patient Gender: M Patient Age:   40 years Exam Location:  [facility] Procedure:      VAS [place] ABI WITH/WO TBI Referring Phys: Bess Harvest [facility] --------------------------------------------------------------------------------  Indications: Ulceration. Cellulitis, abscess, sepsis High Risk         Hypertension, hyperlipidemia, Diabetes, coronary artery Factors:          disease. Other Factors: Cardiomyopathy, CHF, history of CABG.  Limitations: Today's exam was limited due to rapid atrial fibrillation. Comparison Study: No prior study on file Performing Technologist: Sherren Kerns RVS  Examination Guidelines: A complete evaluation includes at minimum, Doppler waveform signals and systolic blood pressure reading at the level of bilateral brachial, anterior tibial, and posterior tibial arteries, when vessel segments are accessible. Bilateral testing is considered an integral part of a complete examination. Photoelectric Plethysmograph (PPG) waveforms and  toe systolic pressure readings are included as required and additional duplex testing as needed. Limited examinations for reoccurring indications may be performed as noted.  ABI Findings: +---------+------------------+-----+--------+--------+ Right    Rt Pressure (mmHg)IndexWaveformComment  +---------+------------------+-----+--------+--------+ Brachial 114                                     +---------+------------------+-----+--------+--------+ PTA      255               2.24 biphasic         +---------+------------------+-----+--------+--------+ DP       255               2.24 biphasic         +---------+------------------+-----+--------+--------+ Great Toe58                0.51                  +---------+------------------+-----+--------+--------+ +---------+------------------+-----+--------+-------+ Left     Lt Pressure (mmHg)IndexWaveformComment +---------+------------------+-----+--------+-------+ Brachial 113                                    +---------+------------------+-----+--------+-------+ PTA      255               2.24 biphasic        +---------+------------------+-----+--------+-------+ DP       255               2.24 biphasic        +---------+------------------+-----+--------+-------+ Great Toe0                 0.00 Absent          +---------+------------------+-----+--------+-------+ +-------+---------------------+-----------+------------+------------+ ABI/TBIToday's ABI          Today's TBIPrevious ABIPrevious TBI +-------+---------------------+-----------+------------+------------+ Right  2.24 non compressible0.51                                +-------+---------------------+-----------+------------+------------+  Left   2.24 non compressibleabsent                              +-------+---------------------+-----------+------------+------------+ Arterial wall calcification precludes accurate ankle pressures and ABIs.   Summary: Right: Resting right ankle-brachial index indicates noncompressible right lower extremity arteries. The right toe-brachial index is abnormal. Left: Resting left ankle-brachial index indicates noncompressible left lower extremity arteries. Unable to obtain left TBI secondary to absent pulse. *See table(s) above for measurements and observations.  Electronically signed by Sherald Hess MD on 02/10/2023 at 1:02:44 PM.    Final    VAS [place] LOWER EXTREMITY VENOUS (DVT) Result Date: 02/09/2023  Lower Venous DVT Study Patient Name:  CHARLI LIBERATORE  Date of Exam:   02/09/2023 Medical Rec #: [id number]       Accession #:    [id number] Date of Birth: [birth year]       Patient Gender: M Patient Age:   35 years Exam Location:  [facility] Procedure:      VAS [place] LOWER EXTREMITY VENOUS (DVT) Referring Phys: Merrilyn Puma --------------------------------------------------------------------------------  Indications: Swelling. Other Indications: Cellulitis. Comparison Study: No previous exams Performing Technologist: Jody Hill RVT, RDMS  Examination Guidelines: A complete evaluation includes B-mode imaging, spectral Doppler, color Doppler, and power Doppler as needed of all accessible portions of each vessel. Bilateral testing is considered an integral part of a complete examination. Limited examinations for reoccurring indications may be performed as noted. The reflux portion of the exam is performed with the patient in reverse Trendelenburg.  +-----+---------------+---------+-----------+----------+--------------+ RIGHTCompressibilityPhasicitySpontaneityPropertiesThrombus Aging +-----+---------------+---------+-----------+----------+--------------+ CFV  Full           Yes      Yes                                 +-----+---------------+---------+-----------+----------+--------------+   +---------+---------------+---------+-----------+----------+--------------+ LEFT      CompressibilityPhasicitySpontaneityPropertiesThrombus Aging +---------+---------------+---------+-----------+----------+--------------+ CFV      Full           Yes      Yes                                 +---------+---------------+---------+-----------+----------+--------------+ SFJ      Full                                                        +---------+---------------+---------+-----------+----------+--------------+ FV Prox  Full           Yes      Yes                                 +---------+---------------+---------+-----------+----------+--------------+ FV Mid   Full           Yes      Yes                                 +---------+---------------+---------+-----------+----------+--------------+ FV DistalFull           Yes      Yes                                 +---------+---------------+---------+-----------+----------+--------------+  PFV      Full                                                        +---------+---------------+---------+-----------+----------+--------------+ POP      Full           Yes      Yes                                 +---------+---------------+---------+-----------+----------+--------------+ PTV      Full                                                        +---------+---------------+---------+-----------+----------+--------------+ PERO     Full                                                        +---------+---------------+---------+-----------+----------+--------------+    Summary: RIGHT: - No evidence of common femoral vein obstruction.   LEFT: - There is no evidence of deep vein thrombosis in the lower extremity.  - No cystic structure found in the popliteal fossa. Subcutaneous edema seen in area of calf and ankle.  *See table(s) above for measurements and observations. Electronically signed by Heath Lark on 02/09/2023 at 4:11:54 PM.    Final    DG Foot Complete Left Result Date:  02/08/2023 CLINICAL DATA:  Rule out osteomyelitis.  Left foot wound. EXAM: LEFT FOOT - COMPLETE 3+ VIEW COMPARISON:  None Available. FINDINGS: There is a bandage along the dorsal aspect of the foot. Negative for acute fracture or dislocation. No evidence for cortical destruction or periosteal reaction. Spurring involving the calcaneus. Atherosclerotic calcifications in left foot. IMPRESSION: 1. No acute bone abnormality to the left foot. 2. No radiographic evidence for osteomyelitis. 3. Atherosclerotic calcifications. Electronically Signed   By: Richarda Overlie M.D.   On: 02/08/2023 17:11      Signature  -   Susa Raring M.D on 02/12/2023 at 10:31 AM   -  To page go to [email or web address]

## 2023-02-12 NOTE — Plan of Care (Signed)
  Problem: Coping: Goal: Ability to adjust to condition or change in health will improve Outcome: Progressing   Problem: Metabolic: Goal: Ability to maintain appropriate glucose levels will improve Outcome: Progressing   Problem: Clinical Measurements: Goal: Will remain free from infection Outcome: Progressing   Problem: Activity: Goal: Risk for activity intolerance will decrease Outcome: Progressing

## 2023-02-12 NOTE — Progress Notes (Signed)
PHARMACY - ANTICOAGULATION CONSULT NOTE  Pharmacy Consult for Heparin  Indication: history of chronic Afib No Known Allergies  Patient Measurements: Height: 5\' 8"  (172.7 cm) Weight: 66.8 kg (147 lb 4.3 oz) IBW/kg (Calculated) : 68.4 Heparin Dosing Weight: 66.8 kg  Vital Signs: Temp: 97.6 F (36.4 C) (02/01 1937) Temp Source: Oral (02/01 1937) BP: 95/64 (02/01 1937) Pulse Rate: 114 (02/01 1937)  Labs: Recent Labs    02/10/23 0422 02/11/23 0520 02/11/23 1042 02/11/23 1042 02/11/23 2051 02/12/23 0520 02/12/23 0812 02/12/23 1841  HGB 10.1* 10.0*  --   --   --  9.8*  --   --   HCT 31.4* 30.6*  --   --   --  29.8*  --   --   PLT 107* 114*  --   --   --  131*  --   --   APTT  --   --  30   < > 52*  --  124* 116*  HEPARINUNFRC  --   --  >1.10*  --  >1.10*  --   --  >1.10*  CREATININE 1.08 1.00  --   --   --  1.01  --   --    < > = values in this interval not displayed.    Estimated Creatinine Clearance: 54.2 mL/min (by C-G formula based on SCr of 1.01 mg/dL).   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 He has a history of chronic Afib and is on Apixaban prior to admission which was continued on this admission.   Vascular surgery consulted for lower extremity PAD, nonhealing left foot dorsal aspect ulcer for the last 2 months  and plans to do angiogram procedure on Monday 02/14/23. Pharmacy consulted 1/31 to stop Apixaban and switch to IV heparin infusion in anticipation of angiogram LLE.  PM: aPTT supratherapeutic (116) despite rate decrease to 1000 units/hr. Confirmed with patient that lab was drawn from opposite arm of heparin infusion. RN noted a nosebleed that patient attributes to congestion.  Goal of Therapy:  Heparin level 0.3-0.7 units/ml aPTT 66-102 seconds Monitor platelets by anticoagulation protocol: Yes   Plan:  Decrease heparin infusion to 850  units/hr Will f/u 8hr aPTT/HL Monitor daily aPTT/HL and s/sx of bleeding Discontinue aPTTs once levels correlate  Loralee Pacas, PharmD, BCPS 02/12/2023 7:39 PM  Please check AMION for all [facility] Pharmacy phone numbers After 10:00 PM, call Main Pharmacy [phone]

## 2023-02-12 NOTE — Progress Notes (Signed)
VASCULAR LAB    Left lower extremity arterial duplex has been performed.  See CV proc for preliminary results.   Chance Munter, RVT 02/12/2023, 11:32 AM

## 2023-02-12 NOTE — Progress Notes (Signed)
Pharmacy Antibiotic Note  Donald Rios is a 83 y.o. male admitted on 02/08/2023 presenting with cellulitis.  Pharmacy has been consulted for vancomycin dosing.  Vancomycin 1500 mg IV x 1 given in ED.  Blood cultures w/ NGTD x4.  Plan: Continue vancomycin 1g IV q24h (eAUC 412, Scr 1.01) - F/u after angiogram on Monday 2/3 to see if there's deescalation opportunity Continue ceftriaxone 1g IV q24h per MD F/u renal function, clinical progression, and LOT  Height: 5\' 8"  (172.7 cm) Weight: 66.8 kg (147 lb 4.3 oz) IBW/kg (Calculated) : 68.4  Temp (24hrs), Avg:97.9 F (36.6 C), Min:97.7 F (36.5 C), Max:98 F (36.7 C)  Recent Labs  Lab 02/08/23 1624 02/08/23 1629 02/08/23 2055 02/08/23 2300 02/09/23 0903 02/10/23 0422 02/11/23 0520 02/12/23 0520  WBC 31.2*  --   --   --  28.0* 21.9* 15.0* 13.6*  CREATININE 1.56*  --   --   --  1.15 1.08 1.00 1.01  LATICACIDVEN  --  3.2* 2.2* 1.4  --   --   --   --     Estimated Creatinine Clearance: 54.2 mL/min (by C-G formula based on SCr of 1.01 mg/dL).    No Known Allergies  Nicole Kindred, PharmD PGY1 Pharmacy Resident 02/12/2023 12:42 PM

## 2023-02-12 NOTE — Progress Notes (Signed)
PHARMACY - ANTICOAGULATION CONSULT NOTE  Pharmacy Consult for Heparin  Indication: history of chronic Afib No Known Allergies  Patient Measurements: Height: 5\' 8"  (172.7 cm) Weight: 66.8 kg (147 lb 4.3 oz) IBW/kg (Calculated) : 68.4 Heparin Dosing Weight: 66.8 kg  Vital Signs: Temp: 98 F (36.7 C) (02/01 0824) Temp Source: Oral (02/01 0824) BP: 128/65 (02/01 0824) Pulse Rate: 111 (02/01 0824)  Labs: Recent Labs    02/10/23 0422 02/11/23 0520 02/11/23 1042 02/11/23 2051 02/12/23 0520 02/12/23 0812  HGB 10.1* 10.0*  --   --  9.8*  --   HCT 31.4* 30.6*  --   --  29.8*  --   PLT 107* 114*  --   --  131*  --   APTT  --   --  30 52*  --  124*  HEPARINUNFRC  --   --  >1.10* >1.10*  --   --   CREATININE 1.08 1.00  --   --  1.01  --     Estimated Creatinine Clearance: 54.2 mL/min (by C-G formula based on SCr of 1.01 mg/dL).   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 He has a history of chronic Afib and is on Apixaban prior to admission which was continued on this admission.   Vascular surgery consulted for lower extremity PAD, nonhealing left foot dorsal aspect ulcer for the last 2 months  and plans to do angiogram procedure on Monday 02/14/23. Pharmacy consulted 1/31 to stop Apixaban and switch to IV heparin infusion in anticipation of angiogram LLE.  aPTT supratherapeutic (124) following rate increase to 1100 units/hr. Confirmed with patient that lab was drawn from opposite arm of heparin infusion. Overnight RN noted some bleeding from his leg. CBC stable.  Goal of Therapy:  Heparin level 0.3-0.7 units/ml aPTT 66-102 seconds Monitor platelets by anticoagulation protocol: Yes   Plan:  Decrease heparin infusion to 1000 units/hr Will f/u 8hr aPTT/HL Monitor daily aPTT/HL and s/sx of bleeding Discontinue aPTTs once levels correlate  Nicole Kindred, PharmD PGY1  Pharmacy Resident 02/12/2023 9:36 AM

## 2023-02-13 DIAGNOSIS — L0390 Cellulitis, unspecified: Secondary | ICD-10-CM | POA: Diagnosis not present

## 2023-02-13 DIAGNOSIS — A419 Sepsis, unspecified organism: Secondary | ICD-10-CM | POA: Diagnosis not present

## 2023-02-13 DIAGNOSIS — I70245 Atherosclerosis of native arteries of left leg with ulceration of other part of foot: Secondary | ICD-10-CM | POA: Diagnosis not present

## 2023-02-13 LAB — CULTURE, BLOOD (ROUTINE X 2)
Culture: NO GROWTH
Culture: NO GROWTH
Special Requests: ADEQUATE
Special Requests: ADEQUATE

## 2023-02-13 LAB — GLUCOSE, CAPILLARY
Glucose-Capillary: 151 mg/dL — ABNORMAL HIGH (ref 70–99)
Glucose-Capillary: 233 mg/dL — ABNORMAL HIGH (ref 70–99)
Glucose-Capillary: 246 mg/dL — ABNORMAL HIGH (ref 70–99)
Glucose-Capillary: 192 mg/dL — ABNORMAL HIGH (ref 70–99)

## 2023-02-13 LAB — APTT
aPTT: 71 s — ABNORMAL HIGH (ref 24–36)
aPTT: 81 s — ABNORMAL HIGH (ref 24–36)

## 2023-02-13 LAB — CBC WITH DIFFERENTIAL/PLATELET
Abs Immature Granulocytes: 0.58 10*3/uL — ABNORMAL HIGH (ref 0.00–0.07)
Basophils Absolute: 0.1 10*3/uL (ref 0.0–0.1)
Basophils Relative: 1 %
Eosinophils Absolute: 0 10*3/uL (ref 0.0–0.5)
Eosinophils Relative: 0 %
HCT: 32.5 % — ABNORMAL LOW (ref 39.0–52.0)
Hemoglobin: 10.6 g/dL — ABNORMAL LOW (ref 13.0–17.0)
Immature Granulocytes: 4 %
Lymphocytes Relative: 12 %
Lymphs Abs: 1.8 10*3/uL (ref 0.7–4.0)
MCH: 34.4 pg — ABNORMAL HIGH (ref 26.0–34.0)
MCHC: 32.6 g/dL (ref 30.0–36.0)
MCV: 105.5 fL — ABNORMAL HIGH (ref 80.0–100.0)
Monocytes Absolute: 0.7 10*3/uL (ref 0.1–1.0)
Monocytes Relative: 5 %
Neutro Abs: 11.2 10*3/uL — ABNORMAL HIGH (ref 1.7–7.7)
Neutrophils Relative %: 78 %
Platelets: UNDETERMINED 10*3/uL (ref 150–400)
RBC: 3.08 MIL/uL — ABNORMAL LOW (ref 4.22–5.81)
RDW: 15.2 % (ref 11.5–15.5)
WBC: 14.4 10*3/uL — ABNORMAL HIGH (ref 4.0–10.5)
nRBC: 0.6 % — ABNORMAL HIGH (ref 0.0–0.2)

## 2023-02-13 LAB — BASIC METABOLIC PANEL
Anion gap: 6 (ref 5–15)
BUN: 13 mg/dL (ref 8–23)
CO2: 22 mmol/L (ref 22–32)
Calcium: 8.7 mg/dL — ABNORMAL LOW (ref 8.9–10.3)
Chloride: 109 mmol/L (ref 98–111)
Creatinine, Ser: 0.96 mg/dL (ref 0.61–1.24)
GFR, Estimated: >60 mL/min (ref >60)
Glucose, Bld: 159 mg/dL — ABNORMAL HIGH (ref 70–99)
Potassium: 4 mmol/L (ref 3.5–5.1)
Sodium: 137 mmol/L (ref 135–145)

## 2023-02-13 LAB — PROCALCITONIN: Procalcitonin: <0.1 ng/mL

## 2023-02-13 LAB — C-REACTIVE PROTEIN: CRP: <0.5 mg/dL (ref <1.0)

## 2023-02-13 LAB — PHOSPHORUS: Phosphorus: 3 mg/dL (ref 2.5–4.6)

## 2023-02-13 LAB — HEPARIN LEVEL (UNFRACTIONATED): Heparin Unfractionated: 1.1 [IU]/mL — ABNORMAL HIGH (ref 0.30–0.70)

## 2023-02-13 LAB — MAGNESIUM: Magnesium: 2.1 mg/dL (ref 1.7–2.4)

## 2023-02-13 NOTE — Progress Notes (Signed)
   02/13/23 1448  Mobility  Activity Ambulated with assistance in hallway  Level of Assistance Contact guard assist, steadying assist  Assistive Device Front wheel walker  Distance Ambulated (ft) 200 ft  Activity Response Tolerated fair  Mobility Referral Yes  Mobility visit 1 Mobility  Mobility Specialist Start Time (ACUTE ONLY) 1428  Mobility Specialist Stop Time (ACUTE ONLY) 1448  Mobility Specialist Time Calculation (min) (ACUTE ONLY) 20 min   Mobility Specialist: Progress Note  Pre-Mobility:      HR 112, SpO2 97 % RA During Mobility: HR 143 Post-Mobility:    HR 123, SpO2 97% RA  Pt agreeable to mobility session - received in bed. C/o  LLE stiffness. Returned to bed with all needs met - call bell within reach.   Barnie Mort, BS Mobility Specialist Please contact via SecureChat or  Rehab office at [phone].

## 2023-02-13 NOTE — Progress Notes (Signed)
PROGRESS NOTE                                                                                                                                                                                                             Patient Demographics:    Donald Rios, is a 83 y.o. male, DOB - 1940-11-13, KGM:[id number]  Outpatient Primary MD for the patient is [name], MD    LOS - 4  Admit date - 02/08/2023    Chief Complaint  Patient presents with   Leg Swelling       Brief Narrative (HPI from H&P)   83 year old gentleman with history of chronic A-fib on Eliquis, chronic systolic heart failure, coronary artery disease, type 2 diabetes on glipizide, GERD. Patient recently developed temporal arteritis and was on high-dose steroids and currently on tapering his steroids. While on high-dose steroids he started developing wound on his dorsum of the left foot about 2 months ago. Followed by wound care, multiple rounds of antibiotics. Recently treated with Augmentin. Continue to have more pain, redness so sent to ER from wound care center. At the emergency room initially hypotensive improved with IV fluids. He was in A-fib. Leukocyte count 31,000. Blood sugar 235. Creatinine 1.56, lactic acid 3.2. Blood cultures were collected. Started on broad-spectrum antibiotics.    Subjective:   Patient in bed, appears comfortable, denies any headache, no fever, no chest pain or pressure, no shortness of breath , no abdominal pain. No new focal weakness.   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 Outpatient antibiotic courses, currently on Rocephin and vancomycin, sepsis pathophysiology is improving, lower extremity venous duplex unremarkable, MRI done 12/28/2023 is also unremarkable however it is close to 18 weeks old, follow  cultures, ABI suggest bilateral PAD and lower extremity, orthopedics Dr. Lajoyce Corners on case and vascular surgery also consulted due for angiogram on 02/14/2023. Continue wound care and keep leg elevated.  Thankfully is responding well to present antibiotics.  Acute kidney injury: Resuscitated.  Improved.   Chronic A-fib with [place] vas 2 score of greater than 3: Rate controlled now.  Eliquis dose was reduced to half last night.  He will go back on regular doses.   Chronic systolic heart  failure: Currently euvolemic.  He was given IV fluids overnight.  Known ejection fraction of 25%.  Holding losartan and Jardiance. On spironolactone and metoprolol today.  PAD/coronary artery disease status post CABG: Stable.  On Eliquis/Hep gtt and Zocor.  GERD: On PPI.  Continue.  Chronic oral steroid use.  Due to eye issue per family members, has been on it for close to 1 year according to the wife, we will place him on 10 mg for now as blood pressures are slightly soft.  Monitor.    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  Per wound care team.    Type 2 diabetes with hyperglycemia: Patient on sliding scale insulin and glipizide at home.  Will keep on insulin while in the hospital.  Skip nightly dose on 02/13/2023 as he will be n.p.o. for procedure on 02/14/2023.  Lab Results  Component Value Date   HGBA1C 6.2 (H) 02/08/2023   CBG (last 3)  Recent Labs    02/12/23 1635 02/12/23 1944 02/13/23 0746  GLUCAP 329* 290* 151*         Condition - Fair  Family Communication  : Wife [name] [phone] on 02/10/2023  Code Status :  Full  Consults  :  Ortho  PUD Prophylaxis : PPI   Procedures  :     MRI of the left foot.  12/28/22 - 1. Mild subcutaneous edema anterior to the tibial tubercle could be due to cellulitis. No abscess or osteomyelitis is seen. 2. Distal patellar tendinosis.  Lower extremity venous duplex.  No DVT.  ABI. Right: Resting right ankle-brachial index indicates  noncompressible right lower extremity arteries. The right toe-brachial index is abnormal. Left: Resting left ankle-brachial index indicates noncompressible left lower extremity arteries. Unable to obtain left TBI secondary to absent pulse.      Disposition Plan  :    Status is: Inpatient   DVT Prophylaxis  :  Hep gtt/ Eliquis    Lab Results  Component Value Date   PLT PLATELET CLUMPS NOTED ON SMEAR, UNABLE TO ESTIMATE 02/13/2023    Diet :  Diet Order             Diet NPO time specified Except for: Sips with Meds  Diet effective midnight           Diet Carb Modified Fluid consistency: Thin; Room service appropriate? Yes  Diet effective now                    Inpatient Medications  Scheduled Meds:  allopurinol  300 mg Oral QHS   amiodarone  200 mg Oral Daily   insulin aspart  0-6 Units Subcutaneous TID WC   metoprolol tartrate  12.5 mg Oral BID   mirtazapine  15 mg Oral QHS   pantoprazole  40 mg Oral Daily   predniSONE  20 mg Oral Q breakfast   simvastatin  40 mg Oral QHS   Continuous Infusions:  cefTRIAXone (ROCEPHIN)  IV 1 g (02/13/23 1009)   heparin 850 Units/hr (02/12/23 2012)   vancomycin Stopped (02/12/23 2300)   PRN Meds:.acetaminophen **OR** acetaminophen, oxyCODONE, senna-docusate    Objective:   Vitals:   02/12/23 2051 02/12/23 2349 02/13/23 0422 02/13/23 0749  BP: (!) 101/55 126/66 129/73 (!) 142/74  Pulse: (!) 110 (!) 101 (!) 113 (!) 103  Resp:  20 19 19   Temp:  97.9 F (36.6 C) 98 F (36.7 C) 97.7 F (36.5 C)  TempSrc:  Oral Oral Oral  SpO2:  97% 97% 99%  Weight:      Height:        Wt Readings from Last 3 Encounters:  02/08/23 66.8 kg  01/27/23 66.9 kg  01/25/23 65.8 kg     Intake/Output Summary (Last 24 hours) at 02/13/2023 1030 Last data filed at 02/13/2023 0749 Gross per 24 hour  Intake 263.87 ml  Output 2100 ml  Net -1836.13 ml     Physical Exam  Awake Alert, No new F.N deficits, Normal affect .AT,PERRAL Supple  Neck, No JVD,   Symmetrical Chest wall movement, Good air movement bilaterally, CTAB RRR,No Gallops,Rubs or new Murmurs,  +ve B.Sounds, Abd Soft, No tenderness,   L foot under bandage, picture from the day of admission      Data Review:    Recent Labs  Lab 02/08/23 1624 02/09/23 0903 02/10/23 0422 02/11/23 0520 02/12/23 0520 02/13/23 0518  WBC 31.2* 28.0* 21.9* 15.0* 13.6* 14.4*  HGB 12.7* 11.2* 10.1* 10.0* 9.8* 10.6*  HCT 40.1 35.0* 31.4* 30.6* 29.8* 32.5*  PLT 152 149* 107* 114* 131* PLATELET CLUMPS NOTED ON SMEAR, UNABLE TO ESTIMATE  MCV 108.4* 107.4* 106.4* 105.9* 106.0* 105.5*  MCH 34.3* 34.4* 34.2* 34.6* 34.9* 34.4*  MCHC 31.7 32.0 32.2 32.7 32.9 32.6  RDW 15.3 15.4 15.3 15.3 15.1 15.2  LYMPHSABS 1.1  --  2.4 2.1 1.7 1.8  MONOABS 0.9  --  0.8 0.7 0.8 0.7  EOSABS 0.0  --  0.2 0.1 0.1 0.0  BASOSABS 0.1  --  0.1 0.1 0.0 0.1    Recent Labs  Lab 02/08/23 1624 02/08/23 1629 02/08/23 2055 02/08/23 2252 02/08/23 2300 02/09/23 0903 02/10/23 0422 02/11/23 0520 02/12/23 0520 02/13/23 0518  NA 142  --   --   --   --  140 139 138 138 137  K 4.1  --   --   --   --  3.5 4.2 4.1 3.9 4.0  CL 105  --   --   --   --  106 107 108 110 109  CO2 23  --   --   --   --  25 22 23  21* 22  ANIONGAP 14  --   --   --   --  9 10 7 7 6   GLUCOSE 235*  --   --   --   --  87 100* 105* 162* 159*  BUN 18  --   --   --   --  14 12 12 14 13   CREATININE 1.56*  --   --   --   --  1.15 1.08 1.00 1.01 0.96  AST 26  --   --   --   --   --  15  --   --   --   ALT 30  --   --   --   --   --  20  --   --   --   ALKPHOS 79  --   --   --   --   --  64  --   --   --   BILITOT 0.9  --   --   --   --   --  0.8  --   --   --   ALBUMIN 3.6  --   --   --   --   --  2.5*  --   --   --   CRP  --   --   --   --   --   --  0.7 0.6 0.5 <0.5  PROCALCITON  --   --   --   --   --   --  0.18 <0.10 <0.10 <0.10  LATICACIDVEN  --  3.2* 2.2*  --  1.4  --   --   --   --   --   HGBA1C  --   --   --  6.2*  --   --   --    --   --   --   MG  --   --   --   --   --   --  2.2 2.3 2.1 2.1  PHOS  --   --   --   --   --   --   --  3.0 3.1 3.0  CALCIUM 9.4  --   --   --   --  8.8* 8.4* 8.5* 8.4* 8.7*      Recent Labs  Lab 02/08/23 1629 02/08/23 2055 02/08/23 2252 02/08/23 2300 02/09/23 0903 02/10/23 0422 02/11/23 0520 02/12/23 0520 02/13/23 0518  CRP  --   --   --   --   --  0.7 0.6 0.5 <0.5  PROCALCITON  --   --   --   --   --  0.18 <0.10 <0.10 <0.10  LATICACIDVEN 3.2* 2.2*  --  1.4  --   --   --   --   --   HGBA1C  --   --  6.2*  --   --   --   --   --   --   MG  --   --   --   --   --  2.2 2.3 2.1 2.1  CALCIUM  --   --   --   --  8.8* 8.4* 8.5* 8.4* 8.7*    --------------------------------------------------------------------------------------------------------------- Lab Results  Component Value Date   CHOL 151 12/02/2022   HDL 54.60 12/02/2022   LDLCALC 76 12/02/2022   TRIG 101.0 12/02/2022   CHOLHDL 3 12/02/2022    Lab Results  Component Value Date   HGBA1C 6.2 (H) 02/08/2023   No results for input(s): "TSH", "T4TOTAL", "FREET4", "T3FREE", "THYROIDAB" in the last 72 hours. No results for input(s): "VITAMINB12", "FOLATE", "FERRITIN", "TIBC", "IRON", "RETICCTPCT" in the last 72 hours.  ------------------------------------------------------------------------------------------------------------------ Cardiac Enzymes No results for input(s): "CKMB", "TROPONINI", "MYOGLOBIN" in the last 168 hours.  Invalid input(s): "CK"  Micro Results Recent Results (from the past 240 hours)  Blood culture (routine x 2)     Status: None   Collection Time: 02/08/23  3:53 PM   Specimen: BLOOD RIGHT ARM  Result Value Ref Range Status   Specimen Description BLOOD RIGHT ARM  Final   Special Requests   Final    BOTTLES DRAWN AEROBIC AND ANAEROBIC Blood Culture adequate volume   Culture   Final    NO GROWTH 5 DAYS Performed at [facility], [address], [place], [place] [address]    Report  Status 02/13/2023 FINAL  Final  Blood culture (routine x 2)     Status: None   Collection Time: 02/08/23  3:58 PM   Specimen: BLOOD LEFT ARM  Result Value Ref Range Status   Specimen Description BLOOD LEFT ARM  Final   Special Requests   Final    BOTTLES DRAWN AEROBIC AND ANAEROBIC Blood Culture adequate volume   Culture   Final    NO GROWTH 5 DAYS Performed at [facility],  [address], [place], [place] [address]    Report Status 02/13/2023 FINAL  Final  MRSA Next Gen by PCR, Nasal     Status: None   Collection Time: 02/10/23  5:47 AM   Specimen: Nasal Mucosa; Nasal Swab  Result Value Ref Range Status   MRSA by PCR Next Gen NOT DETECTED NOT DETECTED Final    Comment: (NOTE) The GeneXpert MRSA Assay (FDA approved for NASAL specimens only), is one component of a comprehensive MRSA colonization surveillance program. It is not intended to diagnose MRSA infection nor to guide or monitor treatment for MRSA infections. Test performance is not FDA approved in patients less than 71 years old. Performed at [facility], [address] [place], [place] [address]     Radiology Reports VAS [place] LOWER EXTREMITY ARTERIAL DUPLEX Result Date: 02/12/2023 LOWER EXTREMITY ARTERIAL DUPLEX STUDY Patient Name:  SEGUNDO MAKELA  Date of Exam:   02/12/2023 Medical Rec #: [id number]       Accession #:    [id number] Date of Birth: 1940/10/23       Patient Gender: M Patient Age:   82 years Exam Location:  [facility] Procedure:      VAS [place] LOWER EXTREMITY ARTERIAL DUPLEX Referring Phys: Lemar Livings --------------------------------------------------------------------------------  Indications: Ulceration. High Risk Factors: Hypertension, hyperlipidemia, Diabetes, coronary artery                    disease. Other Factors: Cardiomyopathy, permanent Afib, CABG, history of temporal                arteritis.  Current ABI: Bilateral non compressible TBI: R:0.51 L: absent Comparison Study: No prior study  on file Performing Technologist: Sherren Kerns RVS  Examination Guidelines: A complete evaluation includes B-mode imaging, spectral Doppler, color Doppler, and power Doppler as needed of all accessible portions of each vessel. Bilateral testing is considered an integral part of a complete examination. Limited examinations for reoccurring indications may be performed as noted.   +-----------+--------+-----+---------------+-------------------+--------+ LEFT       PSV cm/sRatioStenosis       Waveform           Comments +-----------+--------+-----+---------------+-------------------+--------+ CFA Prox   87                          multiphasic                 +-----------+--------+-----+---------------+-------------------+--------+ CFA Distal 89                          multiphasic                 +-----------+--------+-----+---------------+-------------------+--------+ DFA        152          30-49% stenosis                            +-----------+--------+-----+---------------+-------------------+--------+ SFA Prox   124                         multiphasic                 +-----------+--------+-----+---------------+-------------------+--------+ SFA Mid    204          50-74% stenosismonophasic                  +-----------+--------+-----+---------------+-------------------+--------+  SFA Distal 407          75-99% stenosismonophasic                  +-----------+--------+-----+---------------+-------------------+--------+ POP Prox   66                          monophasic                  +-----------+--------+-----+---------------+-------------------+--------+ POP Mid    58                          monophasic                  +-----------+--------+-----+---------------+-------------------+--------+ POP Distal 103          30-49% stenosismonophasic                  +-----------+--------+-----+---------------+-------------------+--------+ ATA Prox    61                          monophasic                  +-----------+--------+-----+---------------+-------------------+--------+ ATA Mid    68                          monophasic                  +-----------+--------+-----+---------------+-------------------+--------+ ATA Distal 111          30-49% stenosismonophasic                  +-----------+--------+-----+---------------+-------------------+--------+ PTA Prox   26                          dampened monophasic         +-----------+--------+-----+---------------+-------------------+--------+ PTA Mid    25                          dampened monophasic         +-----------+--------+-----+---------------+-------------------+--------+ PTA Distal 172          30-49% stenosismonophasic                  +-----------+--------+-----+---------------+-------------------+--------+ PERO Prox  88                          monophasic                  +-----------+--------+-----+---------------+-------------------+--------+ PERO Mid   62                          monophasic                  +-----------+--------+-----+---------------+-------------------+--------+ PERO Distal66                          monophasic                  +-----------+--------+-----+---------------+-------------------+--------+  Summary: Left: 30-49% stenosis noted in the deep femoral artery. 50-74% stenosis noted in the superficial femoral artery. 75-99% stenosis noted in the superficial femoral artery and/or popliteal artery. 30-49% stenosis noted in the popliteal artery. 30-49% stenosis noted in the anterior tibial artery. 30-49% stenosis  noted in the posterior tibial artery. Diffuse calcific plaque noted throughout the left lower extremity arteries.  See table(s) above for measurements and observations. Electronically signed by Lemar Livings MD on 02/12/2023 at 4:02:19 PM.    Final    VAS [place] ABI WITH/WO TBI Result Date: 02/10/2023  LOWER  EXTREMITY DOPPLER STUDY Patient Name:  RAYLYN SPECKMAN  Date of Exam:   02/10/2023 Medical Rec #: [id number]       Accession #:    [id number] Date of Birth: [birth year]       Patient Gender: M Patient Age:   61 years Exam Location:  [facility] Procedure:      VAS [place] ABI WITH/WO TBI Referring Phys: [facility] --------------------------------------------------------------------------------  Indications: Ulceration. Cellulitis, abscess, sepsis High Risk         Hypertension, hyperlipidemia, Diabetes, coronary artery Factors:          disease. Other Factors: Cardiomyopathy, CHF, history of CABG.  Limitations: Today's exam was limited due to rapid atrial fibrillation. Comparison Study: No prior study on file Performing Technologist: Sherren Kerns RVS  Examination Guidelines: A complete evaluation includes at minimum, Doppler waveform signals and systolic blood pressure reading at the level of bilateral brachial, anterior tibial, and posterior tibial arteries, when vessel segments are accessible. Bilateral testing is considered an integral part of a complete examination. Photoelectric Plethysmograph (PPG) waveforms and toe systolic pressure readings are included as required and additional duplex testing as needed. Limited examinations for reoccurring indications may be performed as noted.  ABI Findings: +---------+------------------+-----+--------+--------+ Right    Rt Pressure (mmHg)IndexWaveformComment  +---------+------------------+-----+--------+--------+ Brachial 114                                     +---------+------------------+-----+--------+--------+ PTA      255               2.24 biphasic         +---------+------------------+-----+--------+--------+ DP       255               2.24 biphasic         +---------+------------------+-----+--------+--------+ Great Toe58                0.51                  +---------+------------------+-----+--------+--------+  +---------+------------------+-----+--------+-------+ Left     Lt Pressure (mmHg)IndexWaveformComment +---------+------------------+-----+--------+-------+ Brachial 113                                    +---------+------------------+-----+--------+-------+ PTA      255               2.24 biphasic        +---------+------------------+-----+--------+-------+ DP       255               2.24 biphasic        +---------+------------------+-----+--------+-------+ Great Toe0                 0.00 Absent          +---------+------------------+-----+--------+-------+ +-------+---------------------+-----------+------------+------------+ ABI/TBIToday's ABI          Today's TBIPrevious ABIPrevious TBI +-------+---------------------+-----------+------------+------------+ Right  2.24 non compressible0.51                                +-------+---------------------+-----------+------------+------------+  Left   2.24 non compressibleabsent                              +-------+---------------------+-----------+------------+------------+ Arterial wall calcification precludes accurate ankle pressures and ABIs.  Summary: Right: Resting right ankle-brachial index indicates noncompressible right lower extremity arteries. The right toe-brachial index is abnormal. Left: Resting left ankle-brachial index indicates noncompressible left lower extremity arteries. Unable to obtain left TBI secondary to absent pulse. *See table(s) above for measurements and observations.  Electronically signed by Sherald Hess MD on 02/10/2023 at 1:02:44 PM.    Final       Signature  -   Susa Raring M.D on 02/13/2023 at 10:30 AM   -  To page go to [email or web address]

## 2023-02-13 NOTE — Plan of Care (Signed)
  Problem: Coping: Goal: Ability to adjust to condition or change in health will improve Outcome: Progressing   Problem: Skin Integrity: Goal: Risk for impaired skin integrity will decrease Outcome: Progressing   Problem: Clinical Measurements: Goal: Will remain free from infection Outcome: Progressing   Problem: Safety: Goal: Ability to remain free from injury will improve Outcome: Progressing

## 2023-02-13 NOTE — Progress Notes (Signed)
Progress Note    02/13/2023 12:07 PM * No surgery found *  Subjective: Not having any complaints  Vitals:   02/13/23 0749 02/13/23 1152  BP: (!) 142/74 104/61  Pulse: (!) 103 (!) 102  Resp: 19 17  Temp: 97.7 F (36.5 C) 98.2 F (36.8 C)  SpO2: 99% 95%    Physical Exam: Awake alert and oriented Nonlabored respirations Left foot dressing is intact 2+ palpable bilateral, femoral pulses  CBC    Component Value Date/Time   WBC 14.4 (H) 02/13/2023 0518   RBC 3.08 (L) 02/13/2023 0518   HGB 10.6 (L) 02/13/2023 0518   HGB 12.7 (L) 09/24/2022 1507   HCT 32.5 (L) 02/13/2023 0518   HCT 39.9 09/24/2022 1507   PLT PLATELET CLUMPS NOTED ON SMEAR, UNABLE TO ESTIMATE 02/13/2023 0518   PLT 128 (L) 09/24/2022 1507   MCV 105.5 (H) 02/13/2023 0518   MCV 103 (H) 09/24/2022 1507   MCH 34.4 (H) 02/13/2023 0518   MCHC 32.6 02/13/2023 0518   RDW 15.2 02/13/2023 0518   RDW 16.1 (H) 09/24/2022 1507   LYMPHSABS 1.8 02/13/2023 0518   MONOABS 0.7 02/13/2023 0518   EOSABS 0.0 02/13/2023 0518   BASOSABS 0.1 02/13/2023 0518    BMET    Component Value Date/Time   NA 137 02/13/2023 0518   NA 142 08/30/2022 1436   K 4.0 02/13/2023 0518   CL 109 02/13/2023 0518   CO2 22 02/13/2023 0518   GLUCOSE 159 (H) 02/13/2023 0518   BUN 13 02/13/2023 0518   BUN 22 08/30/2022 1436   CREATININE 0.96 02/13/2023 0518   CREATININE 1.11 12/31/2022 1541   CALCIUM 8.7 (L) 02/13/2023 0518   GFRNONAA >60 02/13/2023 0518   GFRNONAA 62 09/13/2019 0907   GFRAA 72 09/13/2019 0907    INR    Component Value Date/Time   INR 1.9 (H) 11/16/2022 1239     Intake/Output Summary (Last 24 hours) at 02/13/2023 1207 Last data filed at 02/13/2023 1153 Gross per 24 hour  Intake 263.87 ml  Output 2300 ml  Net -2036.13 ml   LEFT       PSV cm/sRatioStenosis       Waveform           Comments  +-----------+--------+-----+---------------+-------------------+--------+  CFA Prox   87                           multiphasic                  +-----------+--------+-----+---------------+-------------------+--------+  CFA Distal 89                          multiphasic                  +-----------+--------+-----+---------------+-------------------+--------+  DFA       152          30-49% stenosis                             +-----------+--------+-----+---------------+-------------------+--------+  SFA Prox   124                         multiphasic                  +-----------+--------+-----+---------------+-------------------+--------+  SFA Mid    204  50-74% stenosismonophasic                   +-----------+--------+-----+---------------+-------------------+--------+  SFA Distal 407          75-99% stenosismonophasic                   +-----------+--------+-----+---------------+-------------------+--------+  POP Prox   66                          monophasic                   +-----------+--------+-----+---------------+-------------------+--------+  POP Mid    58                          monophasic                   +-----------+--------+-----+---------------+-------------------+--------+  POP Distal 103          30-49% stenosismonophasic                   +-----------+--------+-----+---------------+-------------------+--------+  ATA Prox   61                          monophasic                   +-----------+--------+-----+---------------+-------------------+--------+  ATA Mid    68                          monophasic                   +-----------+--------+-----+---------------+-------------------+--------+  ATA Distal 111          30-49% stenosismonophasic                   +-----------+--------+-----+---------------+-------------------+--------+  PTA Prox   26                          dampened monophasic          +-----------+--------+-----+---------------+-------------------+--------+  PTA Mid    25                           dampened monophasic          +-----------+--------+-----+---------------+-------------------+--------+  PTA Distal 172          30-49% stenosismonophasic                   +-----------+--------+-----+---------------+-------------------+--------+  PERO Prox  88                          monophasic                   +-----------+--------+-----+---------------+-------------------+--------+  PERO Mid   62                          monophasic                   +-----------+--------+-----+---------------+-------------------+--------+  PERO Distal66                          monophasic                   +-----------+--------+-----+---------------+-------------------+--------+  Summary:  Left: 30-49% stenosis noted in the deep femoral artery. 50-74% stenosis  noted in the superficial femoral artery. 75-99% stenosis noted in the  superficial femoral artery and/or popliteal artery. 30-49% stenosis noted  in the popliteal artery. 30-49%  stenosis noted in the anterior tibial artery. 30-49% stenosis noted in the  posterior tibial artery. Diffuse calcific plaque noted throughout the left  lower extremity arteries.   Assessment/plan:  83 y.o. 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  We discussed the results of his noninvasive studies and plan for angiography from the right common femoral approach tomorrow to evaluate the left lower extremity possible treatment with atherectomy, balloon angioplasty, stenting.  Patient demonstrates good understanding will be n.p.o. past midnight.   Donald Seiple C. Randie Heinz, MD [facility] Office: [phone] Pager: [phone]  02/13/2023 12:07 PM

## 2023-02-13 NOTE — Plan of Care (Signed)
  Problem: Clinical Measurements: Goal: Ability to avoid or minimize complications of infection will improve Outcome: Progressing   Problem: Skin Integrity: Goal: Skin integrity will improve Outcome: Progressing   Problem: Education: Goal: Knowledge of disease or condition will improve Outcome: Progressing Goal: Understanding of medication regimen will improve Outcome: Progressing Goal: Individualized Educational Video(s) Outcome: Progressing   Problem: Activity: Goal: Ability to tolerate increased activity will improve Outcome: Progressing   Problem: Cardiac: Goal: Ability to achieve and maintain adequate cardiopulmonary perfusion will improve Outcome: Progressing   Problem: Health Behavior/Discharge Planning: Goal: Ability to safely manage health-related needs after discharge will improve Outcome: Progressing

## 2023-02-13 NOTE — Progress Notes (Addendum)
PHARMACY - ANTICOAGULATION CONSULT NOTE  Pharmacy Consult for Heparin  Indication: history of chronic Afib No Known Allergies  Patient Measurements: Height: 5\' 8"  (172.7 cm) Weight: 66.8 kg (147 lb 4.3 oz) IBW/kg (Calculated) : 68.4 Heparin Dosing Weight: 66.8 kg  Vital Signs: Temp: 98 F (36.7 C) (02/02 0422) Temp Source: Oral (02/02 0422) BP: 129/73 (02/02 0422) Pulse Rate: 113 (02/02 0422)  Labs: Recent Labs    02/11/23 0520 02/11/23 1042 02/11/23 2051 02/12/23 0520 02/12/23 0812 02/12/23 1841 02/13/23 0518  HGB 10.0*  --   --  9.8*  --   --   --   HCT 30.6*  --   --  29.8*  --   --   --   PLT 114*  --   --  131*  --   --   --   APTT  --    < > 52*  --  124* 116* 71*  HEPARINUNFRC  --    < > >1.10*  --   --  >1.10* 1.10*  CREATININE 1.00  --   --  1.01  --   --   --    < > = values in this interval not displayed.    Estimated Creatinine Clearance: 54.2 mL/min (by C-G formula based on SCr of 1.01 mg/dL).   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 He has a history of chronic Afib and is on Apixaban prior to admission which was continued on this admission.   Vascular surgery consulted for lower extremity PAD, nonhealing left foot dorsal aspect ulcer for the last 2 months  and plans to do angiogram procedure on Monday 02/14/23. Pharmacy consulted 1/31 to stop Apixaban and switch to IV heparin infusion in anticipation of angiogram LLE.  AM aPTT therapeutic (71 seconds) with rate decrease to 850 units/hr. Per RN, no issues with heparin infusion or signs/symptoms of bleeding. CBC in process  Goal of Therapy:  Heparin level 0.3-0.7 units/ml aPTT 66-102 seconds Monitor platelets by anticoagulation protocol: Yes   Plan:  Continue heparin infusion to 850 units/hr Will f/u 8hr aPTT Monitor daily aPTT/heparin level and s/sx of bleeding Discontinue aPTTs once  levels correlate  Arabella Merles, PharmD. Clinical Pharmacist 02/13/2023 6:30 AM

## 2023-02-13 NOTE — Progress Notes (Signed)
PHARMACY - ANTICOAGULATION CONSULT NOTE  Pharmacy Consult for Heparin  Indication: history of chronic Afib No Known Allergies  Patient Measurements: Height: 5\' 8"  (172.7 cm) Weight: 66.8 kg (147 lb 4.3 oz) IBW/kg (Calculated) : 68.4 Heparin Dosing Weight: 66.8 kg  Vital Signs: Temp: 98.2 F (36.8 C) (02/02 1152) Temp Source: Oral (02/02 1152) BP: 104/61 (02/02 1152) Pulse Rate: 102 (02/02 1152)  Labs: Recent Labs    02/11/23 0520 02/11/23 1042 02/11/23 2051 02/12/23 0520 02/12/23 0812 02/12/23 1841 02/13/23 0518 02/13/23 1245  HGB 10.0*  --   --  9.8*  --   --  10.6*  --   HCT 30.6*  --   --  29.8*  --   --  32.5*  --   PLT 114*  --   --  131*  --   --  PLATELET CLUMPS NOTED ON SMEAR, UNABLE TO ESTIMATE  --   APTT  --    < > 52*  --    < > 116* 71* 81*  HEPARINUNFRC  --    < > >1.10*  --   --  >1.10* 1.10*  --   CREATININE 1.00  --   --  1.01  --   --  0.96  --    < > = values in this interval not displayed.    Estimated Creatinine Clearance: 57 mL/min (by C-G formula based on SCr of 0.96 mg/dL).   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 He has a history of chronic Afib and is on Apixaban prior to admission which was continued on this admission.   Vascular surgery consulted for lower extremity PAD, nonhealing left foot dorsal aspect ulcer for the last 2 months  and plans to do angiogram procedure on Monday 02/14/23. Pharmacy consulted 1/31 to stop Apixaban and switch to IV heparin infusion in anticipation of angiogram LLE.  aPTT therapeutic (81 seconds) on 850 units/hr. Per RN, no issues with heparin infusion or signs/symptoms of bleeding. CBC stable.  Goal of Therapy:  Heparin level 0.3-0.7 units/ml aPTT 66-102 seconds Monitor platelets by anticoagulation protocol: Yes   Plan:  Continue heparin infusion at 850 units/hr Monitor daily aPTT/heparin level  and s/sx of bleeding Discontinue aPTTs once levels correlate  Nicole Kindred, PharmD PGY1 Pharmacy Resident 02/13/2023 2:52 PM

## 2023-02-14 ENCOUNTER — Ambulatory Visit: Payer: Medicare Other | Admitting: Physician Assistant

## 2023-02-14 ENCOUNTER — Encounter (HOSPITAL_COMMUNITY)
Admission: EM | Disposition: A | Discharge status: Home Health | Payer: Self-pay | Source: Home / Self Care | Attending: Internal Medicine

## 2023-02-14 DIAGNOSIS — L0390 Cellulitis, unspecified: Secondary | ICD-10-CM | POA: Diagnosis not present

## 2023-02-14 DIAGNOSIS — I70245 Atherosclerosis of native arteries of left leg with ulceration of other part of foot: Secondary | ICD-10-CM

## 2023-02-14 DIAGNOSIS — A419 Sepsis, unspecified organism: Secondary | ICD-10-CM | POA: Diagnosis not present

## 2023-02-14 HISTORY — PX: ABDOMINAL AORTOGRAM W/LOWER EXTREMITY: CATH118223

## 2023-02-14 HISTORY — PX: PERIPHERAL VASCULAR INTERVENTION: CATH118257

## 2023-02-14 LAB — CBC WITH DIFFERENTIAL/PLATELET
Abs Immature Granulocytes: 0.75 10*3/uL — ABNORMAL HIGH (ref 0.00–0.07)
Basophils Absolute: 0.1 10*3/uL (ref 0.0–0.1)
Basophils Relative: 1 %
Eosinophils Absolute: 0.1 10*3/uL (ref 0.0–0.5)
Eosinophils Relative: 1 %
HCT: 32.3 % — ABNORMAL LOW (ref 39.0–52.0)
Hemoglobin: 10.8 g/dL — ABNORMAL LOW (ref 13.0–17.0)
Immature Granulocytes: 4 %
Lymphocytes Relative: 15 %
Lymphs Abs: 2.6 10*3/uL (ref 0.7–4.0)
MCH: 35.2 pg — ABNORMAL HIGH (ref 26.0–34.0)
MCHC: 33.4 g/dL (ref 30.0–36.0)
MCV: 105.2 fL — ABNORMAL HIGH (ref 80.0–100.0)
Monocytes Absolute: 0.9 10*3/uL (ref 0.1–1.0)
Monocytes Relative: 5 %
Neutro Abs: 13 10*3/uL — ABNORMAL HIGH (ref 1.7–7.7)
Neutrophils Relative %: 74 %
Platelets: 157 10*3/uL (ref 150–400)
RBC: 3.07 MIL/uL — ABNORMAL LOW (ref 4.22–5.81)
RDW: 15.3 % (ref 11.5–15.5)
WBC: 17.5 10*3/uL — ABNORMAL HIGH (ref 4.0–10.5)
nRBC: 0.5 % — ABNORMAL HIGH (ref 0.0–0.2)

## 2023-02-14 LAB — BASIC METABOLIC PANEL WITH GFR
Anion gap: 7 (ref 5–15)
BUN: 15 mg/dL (ref 8–23)
CO2: 25 mmol/L (ref 22–32)
Calcium: 9 mg/dL (ref 8.9–10.3)
Chloride: 107 mmol/L (ref 98–111)
Creatinine, Ser: 1.04 mg/dL (ref 0.61–1.24)
GFR, Estimated: >60 mL/min
Glucose, Bld: 124 mg/dL — ABNORMAL HIGH (ref 70–99)
Potassium: 4.4 mmol/L (ref 3.5–5.1)
Sodium: 139 mmol/L (ref 135–145)

## 2023-02-14 LAB — POCT ACTIVATED CLOTTING TIME: Activated Clotting Time: 118 s

## 2023-02-14 LAB — MAGNESIUM: Magnesium: 2.1 mg/dL (ref 1.7–2.4)

## 2023-02-14 LAB — TYPE AND SCREEN
ABO/RH(D): A POS
Antibody Screen: NEGATIVE

## 2023-02-14 LAB — C-REACTIVE PROTEIN: CRP: <0.5 mg/dL (ref <1.0)

## 2023-02-14 LAB — PHOSPHORUS: Phosphorus: 2.8 mg/dL (ref 2.5–4.6)

## 2023-02-14 LAB — APTT: aPTT: 77 s — ABNORMAL HIGH (ref 24–36)

## 2023-02-14 LAB — HEPARIN LEVEL (UNFRACTIONATED): Heparin Unfractionated: 0.94 [IU]/mL — ABNORMAL HIGH (ref 0.30–0.70)

## 2023-02-14 LAB — GLUCOSE, CAPILLARY
Glucose-Capillary: 117 mg/dL — ABNORMAL HIGH (ref 70–99)
Glucose-Capillary: 136 mg/dL — ABNORMAL HIGH (ref 70–99)
Glucose-Capillary: 190 mg/dL — ABNORMAL HIGH (ref 70–99)
Glucose-Capillary: 210 mg/dL — ABNORMAL HIGH (ref 70–99)

## 2023-02-14 LAB — PROCALCITONIN: Procalcitonin: <0.1 ng/mL

## 2023-02-14 SURGERY — ABDOMINAL AORTOGRAM W/LOWER EXTREMITY
Anesthesia: LOCAL

## 2023-02-14 MED ORDER — HEPARIN SODIUM (PORCINE) 1000 UNIT/ML IJ SOLN
INTRAMUSCULAR | Status: AC
  Filled 2023-02-14: qty 10

## 2023-02-14 MED ORDER — IODIXANOL 320 MG/ML IV SOLN
INTRAVENOUS | Status: DC | PRN
  Administered 2023-02-14: 48 mL

## 2023-02-14 MED ORDER — FENTANYL CITRATE (PF) 100 MCG/2ML IJ SOLN
INTRAMUSCULAR | Status: DC | PRN
  Administered 2023-02-14: 50 ug via INTRAVENOUS

## 2023-02-14 MED ORDER — METOPROLOL TARTRATE 25 MG PO TABS: 25.0000 mg | ORAL_TABLET | Freq: Two times a day (BID) | ORAL | Status: DC

## 2023-02-14 MED ORDER — MORPHINE SULFATE (PF) 2 MG/ML IV SOLN
1.0000 mg | Freq: Once | INTRAVENOUS | Status: AC
  Administered 2023-02-14: 1 mg via INTRAVENOUS
  Filled 2023-02-14: qty 1

## 2023-02-14 MED ORDER — HEPARIN SODIUM (PORCINE) 1000 UNIT/ML IJ SOLN
INTRAMUSCULAR | Status: DC | PRN
  Administered 2023-02-14: 6000 [IU] via INTRAVENOUS

## 2023-02-14 MED ORDER — CLOPIDOGREL BISULFATE 300 MG PO TABS
ORAL_TABLET | ORAL | Status: AC
  Filled 2023-02-14: qty 1

## 2023-02-14 MED ORDER — MIDAZOLAM HCL 2 MG/2ML IJ SOLN
INTRAMUSCULAR | Status: AC
Start: 2023-02-14 — End: ?
  Filled 2023-02-14: qty 2

## 2023-02-14 MED ORDER — METOPROLOL TARTRATE 50 MG PO TABS
50.0000 mg | ORAL_TABLET | Freq: Two times a day (BID) | ORAL | Status: DC
Start: 2023-02-14 — End: 2023-02-15
  Administered 2023-02-14 – 2023-02-15 (×3): 50 mg via ORAL
  Filled 2023-02-14 (×3): qty 1

## 2023-02-14 MED ORDER — HEPARIN (PORCINE) IN NACL 1000-0.9 UT/500ML-% IV SOLN
INTRAVENOUS | Status: DC | PRN
  Administered 2023-02-14 (×2): 500 mL

## 2023-02-14 MED ORDER — FENTANYL CITRATE (PF) 100 MCG/2ML IJ SOLN
INTRAMUSCULAR | Status: AC
  Filled 2023-02-14: qty 2

## 2023-02-14 MED ORDER — LACTATED RINGERS IV SOLN
INTRAVENOUS | Status: AC
Start: 2023-02-14 — End: 2023-02-14

## 2023-02-14 MED ORDER — LIDOCAINE HCL (PF) 1 % IJ SOLN
INTRAMUSCULAR | Status: DC | PRN
  Administered 2023-02-14: 15 mL

## 2023-02-14 MED ORDER — INSULIN ASPART 100 UNIT/ML IJ SOLN: 0.0000 [IU] | Freq: Three times a day (TID) | INTRAMUSCULAR | Status: DC

## 2023-02-14 MED ORDER — LIDOCAINE HCL (PF) 1 % IJ SOLN
INTRAMUSCULAR | Status: AC
  Filled 2023-02-14: qty 30

## 2023-02-14 MED ORDER — HEPARIN (PORCINE) 25000 UT/250ML-% IV SOLN
850.0000 [IU]/h | INTRAVENOUS | Status: DC
  Administered 2023-02-14: 850 [IU]/h via INTRAVENOUS
  Filled 2023-02-14: qty 250

## 2023-02-14 MED ORDER — AMIODARONE HCL 200 MG PO TABS
200.0000 mg | ORAL_TABLET | Freq: Every day | ORAL | Status: DC
  Administered 2023-02-14 – 2023-02-15 (×2): 200 mg via ORAL
  Filled 2023-02-14 (×2): qty 1

## 2023-02-14 MED ORDER — INSULIN ASPART 100 UNIT/ML IJ SOLN
0.0000 [IU] | Freq: Every day | INTRAMUSCULAR | Status: DC
  Administered 2023-02-14: 2 [IU] via SUBCUTANEOUS

## 2023-02-14 MED ORDER — MEROPENEM 1 G IV SOLR
1.0000 g | Freq: Three times a day (TID) | INTRAVENOUS | Status: DC
  Administered 2023-02-14 – 2023-02-15 (×4): 1 g via INTRAVENOUS
  Filled 2023-02-14 (×5): qty 20

## 2023-02-14 MED ORDER — CLOPIDOGREL BISULFATE 300 MG PO TABS
ORAL_TABLET | ORAL | Status: DC | PRN
  Administered 2023-02-14: 300 mg via ORAL

## 2023-02-14 MED ORDER — MIDAZOLAM HCL 2 MG/2ML IJ SOLN
INTRAMUSCULAR | Status: DC | PRN
  Administered 2023-02-14: 1 mg via INTRAVENOUS

## 2023-02-14 SURGICAL SUPPLY — 17 items
BALLN MUSTANG 6.0X40 75 (BALLOONS) ×2 IMPLANT
BALLN MUSTANG 7.0X40 135 (BALLOONS) ×2 IMPLANT
BALLOON MUSTANG 6.0X40 75 (BALLOONS) IMPLANT
BALLOON MUSTANG 7.0X40 135 (BALLOONS) IMPLANT
CATH OMNI FLUSH 5F 65CM (CATHETERS) IMPLANT
CATH QUICKCROSS SUPP .035X90CM (MICROCATHETER) IMPLANT
DEVICE TORQUE SEADRAGON GRN (MISCELLANEOUS) IMPLANT
GLIDEWIRE ADV .035X260CM (WIRE) IMPLANT
KIT ENCORE 26 ADVANTAGE (KITS) IMPLANT
SET ATX-X65L (MISCELLANEOUS) IMPLANT
SHEATH CATAPULT 6FR 60 (SHEATH) IMPLANT
SHEATH PINNACLE 5F 10CM (SHEATH) IMPLANT
SHEATH PINNACLE 6F 10CM (SHEATH) IMPLANT
STENT ELUVIA 7X40X130 (Permanent Stent) IMPLANT
TRAY PV CATH (CUSTOM PROCEDURE TRAY) ×3 IMPLANT
WIRE BENTSON .035X145CM (WIRE) IMPLANT
WIRE MICRO SET SILHO 5FR 7 (SHEATH) IMPLANT

## 2023-02-14 NOTE — Progress Notes (Signed)
   02/14/23 0859  Mobility  Activity Dangled on edge of bed  Level of Assistance Standby assist, set-up cues, supervision of patient - no hands on  Assistive Device None  Activity Response Tolerated fair  Mobility Referral Yes  Mobility visit 1 Mobility  Mobility Specialist Start Time (ACUTE ONLY) 0854  Mobility Specialist Stop Time (ACUTE ONLY) 0859  Mobility Specialist Time Calculation (min) (ACUTE ONLY) 5 min   Mobility Specialist: Progress Note  Pre-Mobility:    HR 80s, SpO2 95% RA Post-Mobility:  HR 100s, SpO2 93% RA  Pt agreeable to mobility session - received in bed. C/o LLE pain. Mobility included lateral scoots x4 to be readjusted in bed. Returned to bed with all needs met - call bell within reach. Further mobility deferred by pt d/t pain.   Barnie Mort, BS Mobility Specialist Please contact via SecureChat or  Rehab office at [phone].

## 2023-02-14 NOTE — Plan of Care (Signed)
  Problem: Education: Goal: Ability to describe self-care measures that may prevent or decrease complications (Diabetes Survival Skills Education) will improve Outcome: Progressing   Problem: Coping: Goal: Ability to adjust to condition or change in health will improve Outcome: Progressing   Problem: Fluid Volume: Goal: Ability to maintain a balanced intake and output will improve Outcome: Progressing   Problem: Health Behavior/Discharge Planning: Goal: Ability to identify and utilize available resources and services will improve Outcome: Progressing Goal: Ability to manage health-related needs will improve Outcome: Progressing   Problem: Metabolic: Goal: Ability to maintain appropriate glucose levels will improve Outcome: Progressing   Problem: Activity: Goal: Risk for activity intolerance will decrease Outcome: Progressing   Problem: Coping: Goal: Level of anxiety will decrease Outcome: Progressing   Problem: Elimination: Goal: Will not experience complications related to urinary retention Outcome: Progressing   Problem: Skin Integrity: Goal: Risk for impaired skin integrity will decrease Outcome: Not Progressing

## 2023-02-14 NOTE — Progress Notes (Deleted)
 Office Visit Note  Patient: Donald Rios             Date of Birth: [birth year]           MRN: [id number]             PCP: [name], MD Referring: [name], MD Visit Date: 02/28/2023   Subjective:  No chief complaint on file.   History of Present Illness: Donald Rios is a 83 y.o. 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    Previous HPI 12/28/2022 Donald Rios is a 83 y.o. 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  He presents with ongoing issues of leg swelling and a recent episode of vision loss. The vision loss episode, which occurred on a bright day, was severe enough to prevent the patient from voting independently. The patient's vision returned after being in a dark environment, but he reports that his vision becomes blurred on bright days.   The patient has been receiving Actemra infusions to suppress the immune system and has been on a tapering course of prednisone. He is currently taking 20mg  of prednisone daily. The patient has not experienced any repeat episodes of vision changes since starting this treatment regimen.   In addition to these issues, the patient has been dealing with leg swelling, particularly in the right leg. An MRI revealed inflammation and air in the tissue but the bones appeared normal. The patient also has a hematoma on the top of his foot. The patient is not currently on any antibiotics, completed oral doxycycline since a week ago.     Previous HPI 11/09/2022 Donald Rios is a 83 y.o. 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 Prednisone is down to 25 mg anticipates decrease to 20 mg  next week. He has no recurrence of headaches and no new vision changes. Biggest problem is his leg swelling, ongoing despite wearing compressive socks, elevating feet, and on lasix 40 mg daily. Has some areas of skin breakage around the left ankle and heel.   Previous HPI 10/05/2022 Donald Rios is a 83 y.o. 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  Lab results at previous visit looked fine with normal serum inflammatory markers.  He had recent follow-up with Dr. Tanya Nones labs at that time showing improvement in platelets up to 128.  He still has extensive lower extremity edema.  He was needing paperwork for Actemra application today so had not progressed PA yet.   Previous HPI 09/06/22 Donald Rios is a 83 y.o. 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  He has a medical history significant for coronary artery disease status post CABG, ischemic cardiomyopathy, type 2 diabetes, gout, and PMR.  Symptoms started at the beginning of this month with a sudden onset of left eye floaters and blurriness in his vision initially in the lower part of the field and then across his entire eye.  This progressively worsened over 1 to 2 days he saw his ophthalmologist for this found to  have disc edema with highly elevated sedimentation rate and CRP and sent to the emergency department for evaluation suspected giant cell arteritis.  On admission he was also found to be in A-fib with RVR which is new for him.  After the onset of vision changes he also developed jaw pain this was present mildly at rest and worsened with chewing.  Did not experience any recurrence of previous shoulder or hip pain or stiffness.  In hospital he was found to have elevated serum inflammatory markers ultrasound examination of the temporal arteries indicated a positive halo sign.  He underwent MR angiography of the  head and neck that showed extensive arterial disease and microvascular disease throughout without any definite large vessel arteritis.  He was initially treated with high-dose intravenous steroids then transition to prednisone 60 mg daily.  Had improvement of symptoms jaw pain completely resolved after about 5 days in total.  Unfortunately left eye vision did not significantly improve has some light sensitivity and can see specks but no visual acuity. Right eye has some cloudiness in his vision this has been present since before these recent events and not changed since they started. He began gradual prednisone taper has decreased down to 20 mg daily at this time has not noticed any particular change or return of symptoms with decrease in the dose.  Does have increase in appetite, increase leg swelling, and increased blood sugars since starting the high-dose prednisone. He underwent TEE with direct-current cardioversion on August 5 unfortunately was reverted back into A-fib by time of cardiology follow-up 1 week later.  Findings also indicated decrease in his previous ejection fraction now indicating 30% with moderate to severe mitral regurgitation. He has a previous history of polymyalgia rheumatica was diagnosed and managed with Dr. Dierdre Forth at [facility] rheumatology from patient's report started in 2018.  This was characterized by fairly typical sounding shoulder and hip joint pain and stiffness and was treated with a long course of oral steroids.  He had been off any maintenance treatment for years and is no longer actively followed with their office.     Labs reviewed 08/12/2022 ESR 51 CRP 56.3 Lipids HDL 34 else WNL   08/2020 HCV neg   Imaging reviewed 08/19/22 BT Abdomen Pelvis W Contrast IMPRESSION: Colonic diverticulosis, without radiographic evidence of diverticulitis or other acute findings.   Cholelithiasis. No radiographic evidence of cholecystitis.   Small left pleural effusion.      08/14/22 Temporal Artery [place] Technically difficult and limited study, see indications above. No compression of bilateral temporal arteries at proximal, mid, or distal  segments. Abnormal flow noted throughout. Summary:  Presence of a "halo" sign in the bilateral temporal artery suggests temporal arteritis.      08/13/22 MRI/MRA Head/Neck IMPRESSION: MRI HEAD IMPRESSION:   1. No acute intracranial abnormality. 2. 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 3. Innumerable punctate chronic micro hemorrhages involving both cerebral hemispheres. Overall pattern is nonspecific, with differential considerations including sequelae of prior cardiac bypass, chronic hypertension, cerebral amyloid angiopathy, prior trauma, or possibly vasculitis.   MRA HEAD IMPRESSION:   1. Negative intracranial MRA for large vessel occlusion. No hemodynamically significant or correctable stenosis. 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 5. 3 mm supraclinoid left ICA aneurysm.   MRA NECK IMPRESSION:   1. No acute vascular abnormality within the neck. 2. Mild atheromatous irregularity about  both carotid artery systems within the neck without hemodynamically significant greater than 50% stenosis. 3. Patent vertebral arteries within the neck. Right vertebral artery dominant.   No Rheumatology ROS completed.   PMFS History:  Patient Active Problem List   Diagnosis Date Noted   Gangrene of toe of left foot (HCC) 02/10/2023   Cellulitis and abscess of foot 02/09/2023   Sepsis due to cellulitis (HCC) 02/08/2023   High risk medication use 10/25/2022   Screening for tuberculosis 10/25/2022   Atrial fibrillation, chronic (HCC) 08/15/2022   Cardiomyopathy (HCC) 08/15/2022   Temporal arteritis (HCC) 08/13/2022   Anemia 08/04/2022   Protein-calorie malnutrition (HCC) 11/21/2018    Hx of CABG 08/30/2017   Cardiomyopathy, ischemic 12/23/2015   Chronic systolic congestive heart failure, NYHA class 1 (HCC) 12/23/2015   Acute gastric ulcer with hemorrhage 05/06/2013   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    Mild tricuspid regurgitation    Mitral regurgitation    Atrial enlargement, left    Hearing loss    Diverticulosis    Hiatal hernia    Gout    DM2 (diabetes mellitus, type 2) (HCC)    GERD (gastroesophageal reflux disease)    Mixed hyperlipidemia    Essential hypertension     Past Medical History:  Diagnosis Date   Acute gastric ulcer with hemorrhage 05/06/2013   Arthritis    Asthma    Atrial enlargement, left    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    History of MI (myocardial infarction)    History of nuclear stress test 08/07/2010   dipyridamole; EKG negative for ischemia, low risk scan    Hyperlipidemia    Hyperplastic colon polyp 05/2008   Hypertension    Ischemic cardiomyopathy    EF 45%, with inferior wall motion abnormality    Myocardial infarction (HCC)    Valvular regurgitation    mitral and tricuspid (mild)    Family History  Problem Relation Age of Onset   Stroke Father    Hypertension Father    Diabetes Brother    Esophageal cancer Brother    Stroke Maternal Grandmother    Stroke Maternal Grandfather    Stomach cancer Maternal Aunt    Colon cancer Neg Hx    Rectal cancer Neg Hx    Past Surgical History:  Procedure Laterality Date   CARDIOVERSION N/A 08/16/2022   Procedure: CARDIOVERSION;  Surgeon: Jake Bathe, MD;  Location: [facility] INVASIVE CV [facility];  Service: Cardiovascular;  Laterality: N/A;   CORONARY ARTERY BYPASS GRAFT   2000   LIMA to LAD, free IMA to OM2, sequential graft to PLA & PLD   ESOPHAGOGASTRODUODENOSCOPY N/A 05/07/2013   Procedure: ESOPHAGOGASTRODUODENOSCOPY (EGD);  Surgeon: Iva Boop, MD;  Location: [facility] ENDOSCOPY;  Service: Endoscopy;  Laterality: N/A;   TEE WITHOUT CARDIOVERSION N/A 08/16/2022   Procedure: TRANSESOPHAGEAL ECHOCARDIOGRAM;  Surgeon: Jake Bathe, MD;  Location: [facility] INVASIVE CV LAB;  Service: Cardiovascular;  Laterality: N/A;   TRANSTHORACIC ECHOCARDIOGRAM  09/07/2010   EF 45-50%; LV systolic function mildly reduced; LA mildly dilated; mild-mod MR & mild-mod TR; aortic root sclerosis/calcification;  Social History   Social History Narrative   Not on file   Immunization History  Administered Date(s) Administered   Fluad Quad(high Dose 65+) 10/17/2018, 10/29/2019, 12/16/2020, 11/16/2021   Influenza Whole 11/22/2002, 11/07/2003, 10/06/2009   Influenza, High Dose Seasonal PF 10/17/2017   Influenza,inj,Quad PF,6+ Mos 11/02/2012, 10/01/2013, 10/04/2014, 10/03/2015, 10/11/2016   PFIZER(Purple Top)SARS-COV-2 Vaccination 02/20/2019, 03/17/2019, 11/10/2019   Pfizer Covid-19 Vaccine Bivalent Booster 7yrs & up 11/06/2020   Pfizer(Comirnaty)Fall Seasonal Vaccine 12 years and older 12/31/2021   Pneumococcal Conjugate-13 01/26/2013   Pneumococcal Polysaccharide-23 10/27/1998, 03/31/2015   Td 03/27/2009   Tdap 05/24/2011, 10/22/2022   Zoster, Live 01/03/2012     Objective: Vital Signs: There were no vitals taken for this visit.   Physical Exam   Musculoskeletal Exam: ***  CDAI Exam: CDAI Score: -- Patient Global: --; Provider Global: -- Swollen: --; Tender: -- Joint Exam 02/28/2023   No joint exam has been documented for this visit   There is currently no information documented on the homunculus. Go to the Rheumatology activity and complete the homunculus joint exam.  Investigation: No additional findings.  Imaging: VAS [place] LOWER EXTREMITY ARTERIAL DUPLEX Result  Date: 02/12/2023 LOWER EXTREMITY ARTERIAL DUPLEX STUDY Patient Name:  ZYMIER RODGERS  Date of Exam:   02/12/2023 Medical Rec #: [id number]       Accession #:    [id number] Date of Birth: 10/10/1940       Patient Gender: M Patient Age:   45 years Exam Location:  [facility] Procedure:      VAS [place] LOWER EXTREMITY ARTERIAL DUPLEX Referring Phys: Lemar Livings --------------------------------------------------------------------------------  Indications: Ulceration. High Risk Factors: Hypertension, hyperlipidemia, Diabetes, coronary artery                    disease. Other Factors: Cardiomyopathy, permanent Afib, CABG, history of temporal                arteritis.  Current ABI: Bilateral non compressible TBI: R:0.51 L: absent Comparison Study: No prior study on file Performing Technologist: Sherren Kerns RVS  Examination Guidelines: A complete evaluation includes B-mode imaging, spectral Doppler, color Doppler, and power Doppler as needed of all accessible portions of each vessel. Bilateral testing is considered an integral part of a complete examination. Limited examinations for reoccurring indications may be performed as noted.   +-----------+--------+-----+---------------+-------------------+--------+ LEFT       PSV cm/sRatioStenosis       Waveform           Comments +-----------+--------+-----+---------------+-------------------+--------+ CFA Prox   87                          multiphasic                 +-----------+--------+-----+---------------+-------------------+--------+ CFA Distal 89                          multiphasic                 +-----------+--------+-----+---------------+-------------------+--------+ DFA        152          30-49% stenosis                            +-----------+--------+-----+---------------+-------------------+--------+ SFA Prox   124  multiphasic                  +-----------+--------+-----+---------------+-------------------+--------+ SFA Mid    204          50-74% stenosismonophasic                  +-----------+--------+-----+---------------+-------------------+--------+ SFA Distal 407          75-99% stenosismonophasic                  +-----------+--------+-----+---------------+-------------------+--------+ POP Prox   66                          monophasic                  +-----------+--------+-----+---------------+-------------------+--------+ POP Mid    58                          monophasic                  +-----------+--------+-----+---------------+-------------------+--------+ POP Distal 103          30-49% stenosismonophasic                  +-----------+--------+-----+---------------+-------------------+--------+ ATA Prox   61                          monophasic                  +-----------+--------+-----+---------------+-------------------+--------+ ATA Mid    68                          monophasic                  +-----------+--------+-----+---------------+-------------------+--------+ ATA Distal 111          30-49% stenosismonophasic                  +-----------+--------+-----+---------------+-------------------+--------+ PTA Prox   26                          dampened monophasic         +-----------+--------+-----+---------------+-------------------+--------+ PTA Mid    25                          dampened monophasic         +-----------+--------+-----+---------------+-------------------+--------+ PTA Distal 172          30-49% stenosismonophasic                  +-----------+--------+-----+---------------+-------------------+--------+ PERO Prox  88                          monophasic                  +-----------+--------+-----+---------------+-------------------+--------+ PERO Mid   62                          monophasic                   +-----------+--------+-----+---------------+-------------------+--------+ PERO Distal66                          monophasic                  +-----------+--------+-----+---------------+-------------------+--------+  Summary: Left: 30-49% stenosis noted in the deep femoral artery. 50-74% stenosis noted in the superficial femoral artery. 75-99% stenosis noted in the superficial femoral artery and/or popliteal artery. 30-49% stenosis noted in the popliteal artery. 30-49% stenosis noted in the anterior tibial artery. 30-49% stenosis noted in the posterior tibial artery. Diffuse calcific plaque noted throughout the left lower extremity arteries.  See table(s) above for measurements and observations. Electronically signed by Lemar Livings MD on 02/12/2023 at 4:02:19 PM.    Final    VAS [place] ABI WITH/WO TBI Result Date: 02/10/2023  LOWER EXTREMITY DOPPLER STUDY Patient Name:  BRENDYN MCLAREN  Date of Exam:   02/10/2023 Medical Rec #: [id number]       Accession #:    [id number] Date of Birth: 02-18-1940       Patient Gender: M Patient Age:   29 years Exam Location:  [facility] Procedure:      VAS [place] ABI WITH/WO TBI Referring Phys: Bess Harvest [facility] --------------------------------------------------------------------------------  Indications: Ulceration. Cellulitis, abscess, sepsis High Risk         Hypertension, hyperlipidemia, Diabetes, coronary artery Factors:          disease. Other Factors: Cardiomyopathy, CHF, history of CABG.  Limitations: Today's exam was limited due to rapid atrial fibrillation. Comparison Study: No prior study on file Performing Technologist: Sherren Kerns RVS  Examination Guidelines: A complete evaluation includes at minimum, Doppler waveform signals and systolic blood pressure reading at the level of bilateral brachial, anterior tibial, and posterior tibial arteries, when vessel segments are accessible. Bilateral testing is considered an integral part of a complete examination.  Photoelectric Plethysmograph (PPG) waveforms and toe systolic pressure readings are included as required and additional duplex testing as needed. Limited examinations for reoccurring indications may be performed as noted.  ABI Findings: +---------+------------------+-----+--------+--------+ Right    Rt Pressure (mmHg)IndexWaveformComment  +---------+------------------+-----+--------+--------+ Brachial 114                                     +---------+------------------+-----+--------+--------+ PTA      255               2.24 biphasic         +---------+------------------+-----+--------+--------+ DP       255               2.24 biphasic         +---------+------------------+-----+--------+--------+ Great Toe58                0.51                  +---------+------------------+-----+--------+--------+ +---------+------------------+-----+--------+-------+ Left     Lt Pressure (mmHg)IndexWaveformComment +---------+------------------+-----+--------+-------+ Brachial 113                                    +---------+------------------+-----+--------+-------+ PTA      255               2.24 biphasic        +---------+------------------+-----+--------+-------+ DP       255               2.24 biphasic        +---------+------------------+-----+--------+-------+ Great Toe0                 0.00 Absent          +---------+------------------+-----+--------+-------+ +-------+---------------------+-----------+------------+------------+  ABI/TBIToday's ABI          Today's TBIPrevious ABIPrevious TBI +-------+---------------------+-----------+------------+------------+ Right  2.24 non compressible0.51                                +-------+---------------------+-----------+------------+------------+ Left   2.24 non compressibleabsent                              +-------+---------------------+-----------+------------+------------+ Arterial wall  calcification precludes accurate ankle pressures and ABIs.  Summary: Right: Resting right ankle-brachial index indicates noncompressible right lower extremity arteries. The right toe-brachial index is abnormal. Left: Resting left ankle-brachial index indicates noncompressible left lower extremity arteries. Unable to obtain left TBI secondary to absent pulse. *See table(s) above for measurements and observations.  Electronically signed by Sherald Hess MD on 02/10/2023 at 1:02:44 PM.    Final    VAS [place] LOWER EXTREMITY VENOUS (DVT) Result Date: 02/09/2023  Lower Venous DVT Study Patient Name:  AUBRA PAPPALARDO  Date of Exam:   02/09/2023 Medical Rec #: [id number]       Accession #:    [id number] Date of Birth: March 22, 1940       Patient Gender: M Patient Age:   88 years Exam Location[facility]  [facility] Procedure:      VAS [place] LOWER EXTREMITY VENOUS (DVT) Referring Phys: Merrilyn Puma --------------------------------------------------------------------------------  Indications: Swelling. Other Indications: Cellulitis. Comparison Study: No previous exams Performing Technologist: Jody Amberlee Garvey RVT, RDMS  Examination Guidelines: A complete evaluation includes B-mode imaging, spectral Doppler, color Doppler, and power Doppler as needed of all accessible portions of each vessel. Bilateral testing is considered an integral part of a complete examination. Limited examinations for reoccurring indications may be performed as noted. The reflux portion of the exam is performed with the patient in reverse Trendelenburg.  +-----+---------------+---------+-----------+----------+--------------+ RIGHTCompressibilityPhasicitySpontaneityPropertiesThrombus Aging +-----+---------------+---------+-----------+----------+--------------+ CFV  Full           Yes      Yes                                 +-----+---------------+---------+-----------+----------+--------------+    +---------+---------------+---------+-----------+----------+--------------+ LEFT     CompressibilityPhasicitySpontaneityPropertiesThrombus Aging +---------+---------------+---------+-----------+----------+--------------+ CFV      Full           Yes      Yes                                 +---------+---------------+---------+-----------+----------+--------------+ SFJ      Full                                                        +---------+---------------+---------+-----------+----------+--------------+ FV Prox  Full           Yes      Yes                                 +---------+---------------+---------+-----------+----------+--------------+ FV Mid   Full           Yes      Yes                                 +---------+---------------+---------+-----------+----------+--------------+  FV DistalFull           Yes      Yes                                 +---------+---------------+---------+-----------+----------+--------------+ PFV      Full                                                        +---------+---------------+---------+-----------+----------+--------------+ POP      Full           Yes      Yes                                 +---------+---------------+---------+-----------+----------+--------------+ PTV      Full                                                        +---------+---------------+---------+-----------+----------+--------------+ PERO     Full                                                        +---------+---------------+---------+-----------+----------+--------------+    Summary: RIGHT: - No evidence of common femoral vein obstruction.   LEFT: - There is no evidence of deep vein thrombosis in the lower extremity.  - No cystic structure found in the popliteal fossa. Subcutaneous edema seen in area of calf and ankle.  *See table(s) above for measurements and observations. Electronically signed by Heath Lark on  02/09/2023 at 4:11:54 PM.    Final    DG Foot Complete Left Result Date: 02/08/2023 CLINICAL DATA:  Rule out osteomyelitis.  Left foot wound. EXAM: LEFT FOOT - COMPLETE 3+ VIEW COMPARISON:  None Available. FINDINGS: There is a bandage along the dorsal aspect of the foot. Negative for acute fracture or dislocation. No evidence for cortical destruction or periosteal reaction. Spurring involving the calcaneus. Atherosclerotic calcifications in left foot. IMPRESSION: 1. No acute bone abnormality to the left foot. 2. No radiographic evidence for osteomyelitis. 3. Atherosclerotic calcifications. Electronically Signed   By: Richarda Overlie M.D.   On: 02/08/2023 17:11    Recent Labs: Lab Results  Component Value Date   WBC 17.5 (H) 02/14/2023   HGB 10.8 (L) 02/14/2023   PLT 157 02/14/2023   NA 139 02/14/2023   K 4.4 02/14/2023   CL 107 02/14/2023   CO2 25 02/14/2023   GLUCOSE 124 (H) 02/14/2023   BUN 15 02/14/2023   CREATININE 1.04 02/14/2023   BILITOT 0.8 02/10/2023   ALKPHOS 64 02/10/2023   AST 15 02/10/2023   ALT 20 02/10/2023   PROT 4.2 (L) 02/10/2023   ALBUMIN 2.5 (L) 02/10/2023   CALCIUM 9.0 02/14/2023   GFRAA 72 09/13/2019   QFTBGOLDPLUS NEGATIVE 10/20/2022    Speciality Comments: No specialty comments available.  Procedures:  No procedures performed Allergies: Patient has no known allergies.  Assessment / Plan:     Visit Diagnoses: No diagnosis found.  ***  Orders: No orders of the defined types were placed in this encounter.  No orders of the defined types were placed in this encounter.    Follow-Up Instructions: No follow-ups on file.   Metta Clines, RT  Note - This record has been created using [employer].  Chart creation errors have been sought, but may not always  have been located. Such creation errors do not reflect on  the standard of medical care.

## 2023-02-14 NOTE — Progress Notes (Addendum)
  Progress Note    02/14/2023 6:46 AM Hospital Day 5  Subjective:  says his foot started hurting this morning.  Otherwise, no complaints  afebrile  Vitals:   02/14/23 0415 02/14/23 0607  BP: (!) 147/69 (!) 134/105  Pulse: (!) 102 (!) 106  Resp: 20   Temp: 97.7 F (36.5 C)   SpO2: 95%     Physical Exam: General:  no distress Lungs:  non labored Extremities:  left foot is wrapped.  CBC    Component Value Date/Time   WBC 17.5 (H) 02/14/2023 0459   RBC 3.07 (L) 02/14/2023 0459   HGB 10.8 (L) 02/14/2023 0459   HGB 12.7 (L) 09/24/2022 1507   HCT 32.3 (L) 02/14/2023 0459   HCT 39.9 09/24/2022 1507   PLT 157 02/14/2023 0459   PLT 128 (L) 09/24/2022 1507   MCV 105.2 (H) 02/14/2023 0459   MCV 103 (H) 09/24/2022 1507   MCH 35.2 (H) 02/14/2023 0459   MCHC 33.4 02/14/2023 0459   RDW 15.3 02/14/2023 0459   RDW 16.1 (H) 09/24/2022 1507   LYMPHSABS 2.6 02/14/2023 0459   MONOABS 0.9 02/14/2023 0459   EOSABS 0.1 02/14/2023 0459   BASOSABS 0.1 02/14/2023 0459    BMET    Component Value Date/Time   NA 139 02/14/2023 0459   NA 142 08/30/2022 1436   K 4.4 02/14/2023 0459   CL 107 02/14/2023 0459   CO2 25 02/14/2023 0459   GLUCOSE 124 (H) 02/14/2023 0459   BUN 15 02/14/2023 0459   BUN 22 08/30/2022 1436   CREATININE 1.04 02/14/2023 0459   CREATININE 1.11 12/31/2022 1541   CALCIUM 9.0 02/14/2023 0459   GFRNONAA >60 02/14/2023 0459   GFRNONAA 62 09/13/2019 0907   GFRAA 72 09/13/2019 0907    INR    Component Value Date/Time   INR 1.9 (H) 11/16/2022 1239     Intake/Output Summary (Last 24 hours) at 02/14/2023 0646 Last data filed at 02/14/2023 0609 Gross per 24 hour  Intake 245.37 ml  Output 2200 ml  Net -1954.63 ml     Assessment/Plan:  83 y.o. 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 Lajoyce Corners is also following.  Discussed  with pt that they will stick the right groin to look at the left leg.  -continue npo   Doreatha Massed, PA-C [facility] [phone] 02/14/2023 6:46 AM  VASCULAR STAFF ADDENDUM: I have independently interviewed and examined the patient. I agree with the above.   Daria Pastures MD [facility] Phone Number: [phone] 02/14/2023 3:53 PM

## 2023-02-14 NOTE — Progress Notes (Signed)
PHARMACY - ANTICOAGULATION CONSULT NOTE  Pharmacy Consult for Heparin (Apixaban on hold) Indication: atrial fibrillation  No Known Allergies  Patient Measurements: Height: 5\' 8"  (172.7 cm) Weight: 66.8 kg (147 lb 4.3 oz) IBW/kg (Calculated) : 68.4 Heparin Dosing Weight: 66.8 kg  Vital Signs: Temp: 97.6 F (36.4 C) (02/03 1718) Temp Source: Oral (02/03 1718) BP: 108/65 (02/03 1718) Pulse Rate: 107 (02/03 1718)  Labs: Recent Labs    02/12/23 0520 02/12/23 0812 02/12/23 1841 02/13/23 0518 02/13/23 1245 02/14/23 0459  HGB 9.8*  --   --  10.6*  --  10.8*  HCT 29.8*  --   --  32.5*  --  32.3*  PLT 131*  --   --  PLATELET CLUMPS NOTED ON SMEAR, UNABLE TO ESTIMATE  --  157  APTT  --    < > 116* 71* 81* 77*  HEPARINUNFRC  --   --  >1.10* 1.10*  --  0.94*  CREATININE 1.01  --   --  0.96  --  1.04   < > = values in this interval not displayed.   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 He has a history of chronic Afib and is on Apixaban prior to admission which was continued on this admission.  Vascular surgery consulted for lower extremity PAD, nonhealing left foot dorsal aspect ulcer for the last 2 months  and plans to do angiogram procedure on Monday 02/14/23. Pharmacy consulted 1/31 to stop Apixaban and switch to IV heparin infusion in anticipation of angiogram LLE. Last Apixaban dose 1/30 ~9:30pm.  Using aPTTs for monitoring while heparin levels are falsely elevated due to recent Apxiaban doses.  Ok to resume heparin 4 hr post procedure per Rn after report. We will resume at previous rate and check level in AM.  Goal of Therapy:  Heparin level 0.3-0.7 units/ml aPTT 66-102 seconds Monitor platelets by anticoagulation protocol: Yes   Plan:  Resume heparin drip at 850 units/hr - 9 pm HL/PTT in AM Daily aPTT and heparin level until correlating; daily CBC. Apixaban on  hold.   Ulyses Southward, PharmD, BCIDP, AAHIVP, CPP Infectious Disease Pharmacist 02/14/2023 5:35 PM

## 2023-02-14 NOTE — Care Management Important Message (Signed)
Important Message  Patient Details  Name: Donald Rios MRN: [id number] Date of Birth: [birth year]   Important Message Given:  Yes - Medicare IM     Dorena Bodo 02/14/2023, 3:14 PM

## 2023-02-14 NOTE — Progress Notes (Signed)
Physical Therapy Treatment Patient Details Name: Donald Rios MRN: [id number] DOB: 03/07/1940 Today's Date: 02/14/2023   History of Present Illness Patient is an 83 y/o male admitted 02/08/23 with chronic L LE wound now with signs of infection including redness, pain, hypotension and mild tachycardia with CBC noting leukocytosis to 31.2.  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    PT Comments  Pt received in supine and agreeable to session. Pt reports having pain medicine recently and demonstrates improved tolerance throughout session. Pt able to tolerate increased gait distance with improved LLE WB. Pt continues to benefit from PT services to progress toward functional mobility goals.     If plan is discharge home, recommend the following: A little help with walking and/or transfers;A little help with bathing/dressing/bathroom;Assistance with cooking/housework;Assist for transportation;Help with stairs or ramp for entrance   Can travel by private vehicle        Equipment Recommendations  None recommended by PT    Recommendations for Other Services       Precautions / Restrictions Precautions Precautions: Fall Restrictions Weight Bearing Restrictions Per Provider Order: No     Mobility  Bed Mobility Overal bed mobility: Modified Independent                  Transfers Overall transfer level: Needs assistance Equipment used: Rolling walker (2 wheels) Transfers: Sit to/from Stand Sit to Stand: Supervision           General transfer comment: cues for hand placement from EOB    Ambulation/Gait Ambulation/Gait assistance: Contact guard assist Gait Distance (Feet): 240 Feet Assistive device: Rolling walker (2 wheels) Gait Pattern/deviations: Decreased stride length, Antalgic, Decreased step length - right, Decreased stance time - left,  Step-through pattern, Trunk flexed       General Gait Details: step-through pattern with cues for upright posture. Improved LLE WB       Balance Overall balance assessment: Needs assistance Sitting-balance support: Feet supported, No upper extremity supported Sitting balance-Leahy Scale: Good     Standing balance support: Bilateral upper extremity supported, Reliant on assistive device for balance, During functional activity Standing balance-Leahy Scale: Poor Standing balance comment: with RW support                            Cognition Arousal: Alert Behavior During Therapy: WFL for tasks assessed/performed Overall Cognitive Status: Within Functional Limits for tasks assessed                                          Exercises      General Comments        Pertinent Vitals/Pain Pain Assessment Pain Assessment: Faces Faces Pain Scale: Hurts a little bit Pain Location: L foot Pain Descriptors / Indicators: Aching, Guarding Pain Intervention(s): Monitored during session, Repositioned     PT Goals (current goals can now be found in the care plan section) Acute Rehab PT Goals Patient Stated Goal: stop pain in foot PT Goal Formulation: With patient/family Time For Goal Achievement: 02/24/23 Progress towards PT goals: Progressing toward goals    Frequency    Min 1X/week       AM-PAC PT "6 Clicks" Mobility   Outcome Measure  Help needed turning  from your back to your side while in a flat bed without using bedrails?: None Help needed moving from lying on your back to sitting on the side of a flat bed without using bedrails?: None Help needed moving to and from a bed to a chair (including a wheelchair)?: None Help needed standing up from a chair using your arms (e.g., wheelchair or bedside chair)?: A Little Help needed to walk in hospital room?: A Little Help needed climbing 3-5 steps with a railing? : A Lot 6 Click Score: 20     End of Session Equipment Utilized During Treatment: Gait belt Activity Tolerance: Patient tolerated treatment well Patient left: in bed;with call bell/phone within reach;with family/visitor present Nurse Communication: Mobility status PT Visit Diagnosis: Other abnormalities of gait and mobility (R26.89);Difficulty in walking, not elsewhere classified (R26.2);Pain     Time: 6295-2841 PT Time Calculation (min) (ACUTE ONLY): 18 min  Charges:    $Gait Training: 8-22 mins PT General Charges $$ ACUTE PT VISIT: 1 Visit                     Johny Shock, PTA Acute Rehabilitation Services Secure Chat Preferred  [phone]    Johny Shock 02/14/2023, 2:26 PM

## 2023-02-14 NOTE — Progress Notes (Signed)
PROGRESS NOTE                                                                                                                                                                                                             Patient Demographics:    Donald Rios, is a 83 y.o. male, DOB - [birth year], ZOX:[id number]  Outpatient Primary MD for the patient is [name], MD    LOS - 5  Admit date - 02/08/2023    Chief Complaint  Patient presents with   Leg Swelling       Brief Narrative (HPI from H&P)   83 year old gentleman with history of chronic A-fib on Eliquis, chronic systolic heart failure, coronary artery disease, type 2 diabetes on glipizide, GERD. Patient recently developed temporal arteritis and was on high-dose steroids and currently on tapering his steroids. While on high-dose steroids he started developing wound on his dorsum of the left foot about 2 months ago. Followed by wound care, multiple rounds of antibiotics. Recently treated with Augmentin. Continue to have more pain, redness so sent to ER from wound care center. At the emergency room initially hypotensive improved with IV fluids. He was in A-fib. Leukocyte count 31,000. Blood sugar 235. Creatinine 1.56, lactic acid 3.2. Blood cultures were collected. Started on broad-spectrum antibiotics.    Subjective:   Patient in bed denies any headache, no fevers, no chest pain or shortness of breath, no cough, no focal weakness, does have some pain in his left foot today which is worse than yesterday.   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 He has failed multiple outpatient antibiotic courses, currently on vancomycin and Rocephin however on 02/14/2023 pain is worse and developed some leukocytosis hence Rocephin has been switched to meropenem, cultures  thus far remain negative, sepsis pathophysiology has clinically resolved.  He has been seen by vascular surgery and Dr. Lajoyce Corners orthopedics, lower extremity venous duplex unremarkable, ABIs suggest bilateral PAD, due for lower extremity angiogram by vascular surgery on 02/14/2023.  Continue to monitor with supportive care.   Acute kidney injury: Resuscitated.  Improved with hydration, while n.p.o. gentle IV fluids again today on 02/14/2023.   Chronic A-fib with [place] vas 2 score of greater than  3: Rate controlled now.  Was on Eliquis currently on heparin awaiting surgery/angiogram, continue Lopressor and amiodarone along with as needed IV Lopressor.   Chronic systolic heart failure: Currently euvolemic.  He was given IV fluids overnight.  Known ejection fraction of 25%.  Holding losartan and Jardiance. On spironolactone and metoprolol .  PAD/coronary artery disease status post CABG: Stable.  On Eliquis/Hep gtt and Zocor.  GERD: On PPI.  Continue.  Chronic oral steroid use.  Due to eye issue per family members, has been on it for close to 1 year according to the wife, we will place him on 10 mg for now as blood pressures are slightly soft.  Monitor.    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  Per wound care team.    Type 2 diabetes with hyperglycemia: Patient on sliding scale insulin and glipizide at home.  Will keep on insulin while in the hospital.  Skip nightly dose on 02/13/2023 as he will be n.p.o. for procedure on 02/14/2023.  Lab Results  Component Value Date   HGBA1C 6.2 (H) 02/08/2023   CBG (last 3)  Recent Labs    02/13/23 1149 02/13/23 1638 02/13/23 2038  GLUCAP 233* 246* 192*         Condition - Fair  Family Communication  : Wife Donald Rios [phone] on 02/10/2023  Code Status :  Full  Consults  :  Ortho  PUD Prophylaxis : PPI   Procedures  :     MRI of the left foot.  12/28/22 - 1. Mild subcutaneous edema anterior to the tibial tubercle could be due to  cellulitis. No abscess or osteomyelitis is seen. 2. Distal patellar tendinosis.  Lower extremity venous duplex.  No DVT.  ABI. Right: Resting right ankle-brachial index indicates noncompressible right lower extremity arteries. The right toe-brachial index is abnormal. Left: Resting left ankle-brachial index indicates noncompressible left lower extremity arteries. Unable to obtain left TBI secondary to absent pulse.      Disposition Plan  :    Status is: Inpatient   DVT Prophylaxis  :  Hep gtt/ Eliquis    Lab Results  Component Value Date   PLT 157 02/14/2023    Diet :  Diet Order             Diet NPO time specified Except for: Sips with Meds  Diet effective midnight                    Inpatient Medications  Scheduled Meds:  allopurinol  300 mg Oral QHS   amiodarone  200 mg Oral Daily   insulin aspart  0-6 Units Subcutaneous TID WC   metoprolol tartrate  50 mg Oral BID   mirtazapine  15 mg Oral QHS   pantoprazole  40 mg Oral Daily   predniSONE  20 mg Oral Q breakfast   simvastatin  40 mg Oral QHS   Continuous Infusions:  heparin 850 Units/hr (02/13/23 1857)   lactated ringers     vancomycin Stopped (02/13/23 2200)   PRN Meds:.acetaminophen **OR** acetaminophen, oxyCODONE, senna-docusate    Objective:   Vitals:   02/13/23 2048 02/13/23 2328 02/14/23 0415 02/14/23 0607  BP: 134/74 128/72 (!) 147/69 (!) 134/105  Pulse: (!) 115 (!) 109 (!) 102 (!) 106  Resp: 20 18 20    Temp: 98 F (36.7 C) 98 F (36.7 C) 97.7 F (36.5 C)   TempSrc: Oral Oral Oral   SpO2: 96% 96% 95%   Weight:  Height:        Wt Readings from Last 3 Encounters:  02/08/23 66.8 kg  01/27/23 66.9 kg  01/25/23 65.8 kg     Intake/Output Summary (Last 24 hours) at 02/14/2023 0809 Last data filed at 02/14/2023 0609 Gross per 24 hour  Intake 245.37 ml  Output 1700 ml  Net -1454.63 ml     Physical Exam  Awake Alert, No new F.N deficits, Normal affect Hosmer.AT,PERRAL Supple  Neck, No JVD,   Symmetrical Chest wall movement, Good air movement bilaterally, CTAB RRR,No Gallops,Rubs or new Murmurs,  +ve B.Sounds, Abd Soft, No tenderness,   L foot under bandage, picture from the day of admission      Data Review:    Recent Labs  Lab 02/10/23 0422 02/11/23 0520 02/12/23 0520 02/13/23 0518 02/14/23 0459  WBC 21.9* 15.0* 13.6* 14.4* 17.5*  HGB 10.1* 10.0* 9.8* 10.6* 10.8*  HCT 31.4* 30.6* 29.8* 32.5* 32.3*  PLT 107* 114* 131* PLATELET CLUMPS NOTED ON SMEAR, UNABLE TO ESTIMATE 157  MCV 106.4* 105.9* 106.0* 105.5* 105.2*  MCH 34.2* 34.6* 34.9* 34.4* 35.2*  MCHC 32.2 32.7 32.9 32.6 33.4  RDW 15.3 15.3 15.1 15.2 15.3  LYMPHSABS 2.4 2.1 1.7 1.8 2.6  MONOABS 0.8 0.7 0.8 0.7 0.9  EOSABS 0.2 0.1 0.1 0.0 0.1  BASOSABS 0.1 0.1 0.0 0.1 0.1    Recent Labs  Lab 02/08/23 1624 02/08/23 1629 02/08/23 2055 02/08/23 2252 02/08/23 2300 02/09/23 0903 02/10/23 0422 02/11/23 0520 02/12/23 0520 02/13/23 0518 02/14/23 0459  NA 142  --   --   --   --    < > 139 138 138 137 139  K 4.1  --   --   --   --    < > 4.2 4.1 3.9 4.0 4.4  CL 105  --   --   --   --    < > 107 108 110 109 107  CO2 23  --   --   --   --    < > 22 23 21* 22 25  ANIONGAP 14  --   --   --   --    < > 10 7 7 6 7   GLUCOSE 235*  --   --   --   --    < > 100* 105* 162* 159* 124*  BUN 18  --   --   --   --    < > 12 12 14 13 15   CREATININE 1.56*  --   --   --   --    < > 1.08 1.00 1.01 0.96 1.04  AST 26  --   --   --   --   --  15  --   --   --   --   ALT 30  --   --   --   --   --  20  --   --   --   --   ALKPHOS 79  --   --   --   --   --  64  --   --   --   --   BILITOT 0.9  --   --   --   --   --  0.8  --   --   --   --   ALBUMIN 3.6  --   --   --   --   --  2.5*  --   --   --   --  CRP  --   --   --   --   --   --  0.7 0.6 0.5 <0.5 <0.5  PROCALCITON  --   --   --   --   --   --  0.18 <0.10 <0.10 <0.10 <0.10  LATICACIDVEN  --  3.2* 2.2*  --  1.4  --   --   --   --   --   --   HGBA1C  --   --    --  6.2*  --   --   --   --   --   --   --   MG  --   --   --   --   --   --  2.2 2.3 2.1 2.1 2.1  PHOS  --   --   --   --   --   --   --  3.0 3.1 3.0 2.8  CALCIUM 9.4  --   --   --   --    < > 8.4* 8.5* 8.4* 8.7* 9.0   < > = values in this interval not displayed.      Recent Labs  Lab 02/08/23 1629 02/08/23 2055 02/08/23 2252 02/08/23 2300 02/09/23 0903 02/10/23 0422 02/11/23 0520 02/12/23 0520 02/13/23 0518 02/14/23 0459  CRP  --   --   --   --   --  0.7 0.6 0.5 <0.5 <0.5  PROCALCITON  --   --   --   --   --  0.18 <0.10 <0.10 <0.10 <0.10  LATICACIDVEN 3.2* 2.2*  --  1.4  --   --   --   --   --   --   HGBA1C  --   --  6.2*  --   --   --   --   --   --   --   MG  --   --   --   --   --  2.2 2.3 2.1 2.1 2.1  CALCIUM  --   --   --   --    < > 8.4* 8.5* 8.4* 8.7* 9.0   < > = values in this interval not displayed.    --------------------------------------------------------------------------------------------------------------- Lab Results  Component Value Date   CHOL 151 12/02/2022   HDL 54.60 12/02/2022   LDLCALC 76 12/02/2022   TRIG 101.0 12/02/2022   CHOLHDL 3 12/02/2022    Lab Results  Component Value Date   HGBA1C 6.2 (H) 02/08/2023   No results for input(s): "TSH", "T4TOTAL", "FREET4", "T3FREE", "THYROIDAB" in the last 72 hours. No results for input(s): "VITAMINB12", "FOLATE", "FERRITIN", "TIBC", "IRON", "RETICCTPCT" in the last 72 hours.  ------------------------------------------------------------------------------------------------------------------ Cardiac Enzymes No results for input(s): "CKMB", "TROPONINI", "MYOGLOBIN" in the last 168 hours.  Invalid input(s): "CK"  Micro Results Recent Results (from the past 240 hours)  Blood culture (routine x 2)     Status: None   Collection Time: 02/08/23  3:53 PM   Specimen: BLOOD RIGHT ARM  Result Value Ref Range Status   Specimen Description BLOOD RIGHT ARM  Final   Special Requests   Final    BOTTLES DRAWN  AEROBIC AND ANAEROBIC Blood Culture adequate volume   Culture   Final    NO GROWTH 5 DAYS Performed at [facility], [address], [place], [place] [address]    Report Status 02/13/2023 FINAL  Final  Blood culture (routine x 2)  Status: None   Collection Time: 02/08/23  3:58 PM   Specimen: BLOOD LEFT ARM  Result Value Ref Range Status   Specimen Description BLOOD LEFT ARM  Final   Special Requests   Final    BOTTLES DRAWN AEROBIC AND ANAEROBIC Blood Culture adequate volume   Culture   Final    NO GROWTH 5 DAYS Performed at [facility], [address], [place], [place] [address]    Report Status 02/13/2023 FINAL  Final  MRSA Next Gen by PCR, Nasal     Status: None   Collection Time: 02/10/23  5:47 AM   Specimen: Nasal Mucosa; Nasal Swab  Result Value Ref Range Status   MRSA by PCR Next Gen NOT DETECTED NOT DETECTED Final    Comment: (NOTE) The GeneXpert MRSA Assay (FDA approved for NASAL specimens only), is one component of a comprehensive MRSA colonization surveillance program. It is not intended to diagnose MRSA infection nor to guide or monitor treatment for MRSA infections. Test performance is not FDA approved in patients less than 28 years old. Performed at [facility], [address] [place], [place] [address]     Radiology Reports VAS [place] LOWER EXTREMITY ARTERIAL DUPLEX Result Date: 02/12/2023 LOWER EXTREMITY ARTERIAL DUPLEX STUDY Patient Name:  LINZY DARLING  Date of Exam:   02/12/2023 Medical Rec #: [id number]       Accession #:    [id number] Date of Birth: Jun 09, 1940       Patient Gender: M Patient Age:   68 years Exam Location:  [facility] Procedure:      VAS [place] LOWER EXTREMITY ARTERIAL DUPLEX Referring Phys: Lemar Livings --------------------------------------------------------------------------------  Indications: Ulceration. High Risk Factors: Hypertension, hyperlipidemia, Diabetes, coronary artery                    disease. Other  Factors: Cardiomyopathy, permanent Afib, CABG, history of temporal                arteritis.  Current ABI: Bilateral non compressible TBI: R:0.51 L: absent Comparison Study: No prior study on file Performing Technologist: Sherren Kerns RVS  Examination Guidelines: A complete evaluation includes B-mode imaging, spectral Doppler, color Doppler, and power Doppler as needed of all accessible portions of each vessel. Bilateral testing is considered an integral part of a complete examination. Limited examinations for reoccurring indications may be performed as noted.   +-----------+--------+-----+---------------+-------------------+--------+ LEFT       PSV cm/sRatioStenosis       Waveform           Comments +-----------+--------+-----+---------------+-------------------+--------+ CFA Prox   87                          multiphasic                 +-----------+--------+-----+---------------+-------------------+--------+ CFA Distal 89                          multiphasic                 +-----------+--------+-----+---------------+-------------------+--------+ DFA        152          30-49% stenosis                            +-----------+--------+-----+---------------+-------------------+--------+ SFA Prox   124  multiphasic                 +-----------+--------+-----+---------------+-------------------+--------+ SFA Mid    204          50-74% stenosismonophasic                  +-----------+--------+-----+---------------+-------------------+--------+ SFA Distal 407          75-99% stenosismonophasic                  +-----------+--------+-----+---------------+-------------------+--------+ POP Prox   66                          monophasic                  +-----------+--------+-----+---------------+-------------------+--------+ POP Mid    58                          monophasic                   +-----------+--------+-----+---------------+-------------------+--------+ POP Distal 103          30-49% stenosismonophasic                  +-----------+--------+-----+---------------+-------------------+--------+ ATA Prox   61                          monophasic                  +-----------+--------+-----+---------------+-------------------+--------+ ATA Mid    68                          monophasic                  +-----------+--------+-----+---------------+-------------------+--------+ ATA Distal 111          30-49% stenosismonophasic                  +-----------+--------+-----+---------------+-------------------+--------+ PTA Prox   26                          dampened monophasic         +-----------+--------+-----+---------------+-------------------+--------+ PTA Mid    25                          dampened monophasic         +-----------+--------+-----+---------------+-------------------+--------+ PTA Distal 172          30-49% stenosismonophasic                  +-----------+--------+-----+---------------+-------------------+--------+ PERO Prox  88                          monophasic                  +-----------+--------+-----+---------------+-------------------+--------+ PERO Mid   62                          monophasic                  +-----------+--------+-----+---------------+-------------------+--------+ PERO Distal66                          monophasic                  +-----------+--------+-----+---------------+-------------------+--------+  Summary: Left: 30-49% stenosis noted in the deep femoral artery. 50-74% stenosis noted in the superficial femoral artery. 75-99% stenosis noted in the superficial femoral artery and/or popliteal artery. 30-49% stenosis noted in the popliteal artery. 30-49% stenosis noted in the anterior tibial artery. 30-49% stenosis noted in the posterior tibial artery. Diffuse calcific plaque noted  throughout the left lower extremity arteries.  See table(s) above for measurements and observations. Electronically signed by Lemar Livings MD on 02/12/2023 at 4:02:19 PM.    Final    VAS [place] ABI WITH/WO TBI Result Date: 02/10/2023  LOWER EXTREMITY DOPPLER STUDY Patient Name:  OAKLYN MANS  Date of Exam:   02/10/2023 Medical Rec #: [id number]       Accession #:    [id number] Date of Birth: 05-15-1940       Patient Gender: M Patient Age:   44 years Exam Location:  [facility] Procedure:      VAS [place] ABI WITH/WO TBI Referring Phys: [facility] --------------------------------------------------------------------------------  Indications: Ulceration. Cellulitis, abscess, sepsis High Risk         Hypertension, hyperlipidemia, Diabetes, coronary artery Factors:          disease. Other Factors: Cardiomyopathy, CHF, history of CABG.  Limitations: Today's exam was limited due to rapid atrial fibrillation. Comparison Study: No prior study on file Performing Technologist: Sherren Kerns RVS  Examination Guidelines: A complete evaluation includes at minimum, Doppler waveform signals and systolic blood pressure reading at the level of bilateral brachial, anterior tibial, and posterior tibial arteries, when vessel segments are accessible. Bilateral testing is considered an integral part of a complete examination. Photoelectric Plethysmograph (PPG) waveforms and toe systolic pressure readings are included as required and additional duplex testing as needed. Limited examinations for reoccurring indications may be performed as noted.  ABI Findings: +---------+------------------+-----+--------+--------+ Right    Rt Pressure (mmHg)IndexWaveformComment  +---------+------------------+-----+--------+--------+ Brachial 114                                     +---------+------------------+-----+--------+--------+ PTA      255               2.24 biphasic          +---------+------------------+-----+--------+--------+ DP       255               2.24 biphasic         +---------+------------------+-----+--------+--------+ Great Toe58                0.51                  +---------+------------------+-----+--------+--------+ +---------+------------------+-----+--------+-------+ Left     Lt Pressure (mmHg)IndexWaveformComment +---------+------------------+-----+--------+-------+ Brachial 113                                    +---------+------------------+-----+--------+-------+ PTA      255               2.24 biphasic        +---------+------------------+-----+--------+-------+ DP       255               2.24 biphasic        +---------+------------------+-----+--------+-------+ Great Toe0                 0.00 Absent          +---------+------------------+-----+--------+-------+ +-------+---------------------+-----------+------------+------------+  ABI/TBIToday's ABI          Today's TBIPrevious ABIPrevious TBI +-------+---------------------+-----------+------------+------------+ Right  2.24 non compressible0.51                                +-------+---------------------+-----------+------------+------------+ Left   2.24 non compressibleabsent                              +-------+---------------------+-----------+------------+------------+ Arterial wall calcification precludes accurate ankle pressures and ABIs.  Summary: Right: Resting right ankle-brachial index indicates noncompressible right lower extremity arteries. The right toe-brachial index is abnormal. Left: Resting left ankle-brachial index indicates noncompressible left lower extremity arteries. Unable to obtain left TBI secondary to absent pulse. *See table(s) above for measurements and observations.  Electronically signed by Sherald Hess MD on 02/10/2023 at 1:02:44 PM.    Final       Signature  -   Susa Raring M.D on 02/14/2023 at 8:09 AM   -  To  page go to [email or web address]

## 2023-02-14 NOTE — Progress Notes (Signed)
PHARMACY - ANTICOAGULATION CONSULT NOTE  Pharmacy Consult for Heparin (Apixaban on hold) Indication: atrial fibrillation  No Known Allergies  Patient Measurements: Height: 5\' 8"  (172.7 cm) Weight: 66.8 kg (147 lb 4.3 oz) IBW/kg (Calculated) : 68.4 Heparin Dosing Weight: 66.8 kg  Vital Signs: Temp: 97.7 F (36.5 C) (02/03 0415) Temp Source: Oral (02/03 0415) BP: 134/105 (02/03 0607) Pulse Rate: 106 (02/03 0607)  Labs: Recent Labs    02/12/23 0520 02/12/23 0812 02/12/23 1841 02/13/23 0518 02/13/23 1245 02/14/23 0459  HGB 9.8*  --   --  10.6*  --  10.8*  HCT 29.8*  --   --  32.5*  --  32.3*  PLT 131*  --   --  PLATELET CLUMPS NOTED ON SMEAR, UNABLE TO ESTIMATE  --  157  APTT  --    < > 116* 71* 81* 77*  HEPARINUNFRC  --   --  >1.10* 1.10*  --  0.94*  CREATININE 1.01  --   --  0.96  --  1.04   < > = values in this interval not displayed.   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 He has a history of chronic Afib and is on Apixaban prior to admission which was continued on this admission.  Vascular surgery consulted for lower extremity PAD, nonhealing left foot dorsal aspect ulcer for the last 2 months  and plans to do angiogram procedure on Monday 02/14/23. Pharmacy consulted 1/31 to stop Apixaban and switch to IV heparin infusion in anticipation of angiogram LLE. Last Apixaban dose 1/30 ~9:30pm.  Using aPTTs for monitoring while heparin levels are falsely elevated due to recent Apxiaban doses.  aPTT remains therapeutic (77 seconds) on heparin drip at 850 units/hr. Heparin level remains falsely elevated (0.94). CBC stable. No bleeding reported.   For angiogram today.  Goal of Therapy:  Heparin level 0.3-0.7 units/ml aPTT 66-102 seconds Monitor platelets by anticoagulation protocol: Yes   Plan:  Continue heparin drip at 850 units/hr Daily aPTT and heparin level  until correlating; daily CBC. Apixaban on hold. Follow up post-angiogram.  Dennie Fetters, RPh 02/14/2023,8:37 AM

## 2023-02-14 NOTE — Plan of Care (Signed)
  Problem: Health Behavior/Discharge Planning: Goal: Ability to manage health-related needs will improve Outcome: Progressing   Problem: Skin Integrity: Goal: Risk for impaired skin integrity will decrease Outcome: Progressing   Problem: Education: Goal: Knowledge of General Education information will improve Description: Including pain rating scale, medication(s)/side effects and non-pharmacologic comfort measures Outcome: Progressing   Problem: Clinical Measurements: Goal: Will remain free from infection Outcome: Progressing

## 2023-02-14 NOTE — Op Note (Signed)
    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  ABIs demonstrated noncompressible vessels bilaterally with an absent toe pressure on the left.  Risks and benefits of angiogram were reviewed, patient expressed understanding and elected to proceed.  Findings:  Widely patent aorta and bilateral renal arteries.  Widely patent iliac systems bilaterally.  Left common femoral artery with some atherosclerosis but no apparent stenosis.  Widely patent profunda.  Widely patent SFA approximately with a focal severe approximate 90% stenosis at the adductor canal.  Widely patent popliteal artery with three-vessel runoff.   Procedure:  The patient was identified in the holding area and taken to the cath lab  The patient was then placed supine on the table and prepped and draped in the usual sterile fashion.  A time out was called.  Ultrasound was used to evaluate the right common femoral artery.  It was patent .  A digital ultrasound image was acquired.  A micropuncture needle was used to access the right common femoral artery under ultrasound guidance.  An 018 wire was advanced without resistance and a micropuncture sheath was placed.  The 018 wire was removed and a benson wire was placed.  The micropuncture sheath was exchanged for a 5 french sheath.  An  omniflush catheter was advanced over the wire to the level of L-1.  An abdominal angiogram was obtained.  Next, using the omniflush catheter and a glide advantage wire, the aortic bifurcation was crossed and the catheter was placed into theleft external iliac artery and left runoff was obtained. This demonstrated the above findings.  The glide advantage wire was placed to the catheter and into the SFA.  The patient was systemically heparinized.  The short 5 French sheath was exchanged for a 6 [place] by 60 cm catapult sheath.  The SFA lesion was crossed with the glide advantage wire and a 90 cm quick cross.  Angiography via the catheter demonstrated true lumen crossing.  The lesion was then treated with a 7 x 40 Eluvia stent which was postdilated with a 7 x 40 Mustang.  Completion angiography demonstrated an excellent response with minimal residual stenosis and preserved runoff.  The long 6 French sheath was then exchanged for a short 6 French sheath and a minx closure device was deployed with excellent hemostasis.  Contrast: 48 cc  Sedation: 36 minutes  Impression: Excellent response to stenting of distal SFA lesion, maximally revascularized with inline flow to the foot via all 3 tibials   Daria Pastures MD [facility] Office: [phone]

## 2023-02-14 NOTE — Progress Notes (Signed)
Pharmacy Antibiotic Note  Donald Rios is a 83 y.o. male on day #5 Vancomycin and Ceftriaxone for ischemic foot ulcer and cellulitis.  Pharmacy has been consulted for Vancomycin and now Meropenem dosing, changing from Ceftriaxone due to leukocytosis and worsening cellulitis. On steroids.  Plan: Meropenem 1gm IV q8h. Continue Vancomycin 1gm IV q24h. eAUC 412 using SCr 1.01.  Goal 400-550. Follow renal function, clinical progress and antibiotic plans, narrowing if able. Will consider Vanc levels in the next few days if Vanc to continue.   Height: 5\' 8"  (172.7 cm) Weight: 66.8 kg (147 lb 4.3 oz) IBW/kg (Calculated) : 68.4  Temp (24hrs), Avg:97.9 F (36.6 C), Min:97.6 F (36.4 C), Max:98.2 F (36.8 C)  Recent Labs  Lab 02/08/23 1629 02/08/23 2055 02/08/23 2300 02/09/23 0903 02/10/23 0422 02/11/23 0520 02/12/23 0520 02/13/23 0518 02/14/23 0459  WBC  --   --   --    < > 21.9* 15.0* 13.6* 14.4* 17.5*  CREATININE  --   --   --    < > 1.08 1.00 1.01 0.96 1.04  LATICACIDVEN 3.2* 2.2* 1.4  --   --   --   --   --   --    < > = values in this interval not displayed.    Estimated Creatinine Clearance: 52.6 mL/min (by C-G formula based on SCr of 1.04 mg/dL).    No Known Allergies  Antimicrobials this admission: Vancomycin 1/28 (10 pm) >> Ceftriaxone 1/29 >> 2/3 Meropenem 2/3>>  Dose adjustments this admission: n/a  Microbiology results: 1/28 blood: negative 1/30 MRSA PCR: negative  Thank you for allowing pharmacy to be a part of this patient's care.  Dennie Fetters, [place] 02/14/2023 8:31 AM

## 2023-02-15 ENCOUNTER — Encounter: Payer: Self-pay | Admitting: Internal Medicine

## 2023-02-15 ENCOUNTER — Encounter (HOSPITAL_COMMUNITY): Payer: Self-pay | Admitting: Vascular Surgery

## 2023-02-15 ENCOUNTER — Ambulatory Visit (HOSPITAL_COMMUNITY): Payer: Medicare Other | Admitting: Physical Therapy

## 2023-02-15 ENCOUNTER — Other Ambulatory Visit (HOSPITAL_COMMUNITY): Payer: Self-pay

## 2023-02-15 DIAGNOSIS — A419 Sepsis, unspecified organism: Secondary | ICD-10-CM | POA: Diagnosis not present

## 2023-02-15 DIAGNOSIS — I96 Gangrene, not elsewhere classified: Secondary | ICD-10-CM | POA: Diagnosis not present

## 2023-02-15 DIAGNOSIS — L0390 Cellulitis, unspecified: Secondary | ICD-10-CM | POA: Diagnosis not present

## 2023-02-15 LAB — CBC WITH DIFFERENTIAL/PLATELET
Abs Immature Granulocytes: 0.58 10*3/uL — ABNORMAL HIGH (ref 0.00–0.07)
Basophils Absolute: 0.1 10*3/uL (ref 0.0–0.1)
Basophils Relative: 0 %
Eosinophils Absolute: 0.1 10*3/uL (ref 0.0–0.5)
Eosinophils Relative: 0 %
HCT: 34.9 % — ABNORMAL LOW (ref 39.0–52.0)
Hemoglobin: 11.4 g/dL — ABNORMAL LOW (ref 13.0–17.0)
Immature Granulocytes: 3 %
Lymphocytes Relative: 14 %
Lymphs Abs: 2.4 10*3/uL (ref 0.7–4.0)
MCH: 34.4 pg — ABNORMAL HIGH (ref 26.0–34.0)
MCHC: 32.7 g/dL (ref 30.0–36.0)
MCV: 105.4 fL — ABNORMAL HIGH (ref 80.0–100.0)
Monocytes Absolute: 0.9 10*3/uL (ref 0.1–1.0)
Monocytes Relative: 5 %
Neutro Abs: 13.7 10*3/uL — ABNORMAL HIGH (ref 1.7–7.7)
Neutrophils Relative %: 78 %
Platelets: 194 10*3/uL (ref 150–400)
RBC: 3.31 MIL/uL — ABNORMAL LOW (ref 4.22–5.81)
RDW: 15.4 % (ref 11.5–15.5)
WBC: 17.6 10*3/uL — ABNORMAL HIGH (ref 4.0–10.5)
nRBC: 0.5 % — ABNORMAL HIGH (ref 0.0–0.2)

## 2023-02-15 LAB — BASIC METABOLIC PANEL
Anion gap: 8 (ref 5–15)
BUN: 15 mg/dL (ref 8–23)
CO2: 25 mmol/L (ref 22–32)
Calcium: 8.9 mg/dL (ref 8.9–10.3)
Chloride: 107 mmol/L (ref 98–111)
Creatinine, Ser: 0.97 mg/dL (ref 0.61–1.24)
GFR, Estimated: >60 mL/min (ref >60)
Glucose, Bld: 122 mg/dL — ABNORMAL HIGH (ref 70–99)
Potassium: 4.7 mmol/L (ref 3.5–5.1)
Sodium: 140 mmol/L (ref 135–145)

## 2023-02-15 LAB — HEPARIN LEVEL (UNFRACTIONATED): Heparin Unfractionated: 0.66 [IU]/mL (ref 0.30–0.70)

## 2023-02-15 LAB — GLUCOSE, CAPILLARY: Glucose-Capillary: 108 mg/dL — ABNORMAL HIGH (ref 70–99)

## 2023-02-15 LAB — PROCALCITONIN: Procalcitonin: <0.1 ng/mL

## 2023-02-15 LAB — APTT: aPTT: 60 s — ABNORMAL HIGH (ref 24–36)

## 2023-02-15 MED ORDER — ASPIRIN 81 MG PO TBEC
81.0000 mg | DELAYED_RELEASE_TABLET | Freq: Every day | ORAL | Status: DC
  Administered 2023-02-15: 81 mg via ORAL
  Filled 2023-02-15: qty 1

## 2023-02-15 MED ORDER — ROSUVASTATIN CALCIUM 40 MG PO TABS
40.0000 mg | ORAL_TABLET | Freq: Every day | ORAL | 3 refills | Status: DC
  Filled 2023-02-15: qty 90, 90d supply, fill #0

## 2023-02-15 MED ORDER — CLOPIDOGREL BISULFATE 75 MG PO TABS
75.0000 mg | ORAL_TABLET | Freq: Every day | ORAL | Status: DC
  Administered 2023-02-15: 75 mg via ORAL
  Filled 2023-02-15: qty 1

## 2023-02-15 MED ORDER — ASPIRIN 81 MG PO TBEC
81.0000 mg | DELAYED_RELEASE_TABLET | Freq: Every day | ORAL | 0 refills | Status: DC
  Filled 2023-02-15: qty 30, 30d supply, fill #0

## 2023-02-15 MED ORDER — CLOPIDOGREL BISULFATE 75 MG PO TABS
75.0000 mg | ORAL_TABLET | Freq: Every day | ORAL | 0 refills | Status: AC
  Filled 2023-02-15: qty 30, 30d supply, fill #0

## 2023-02-15 MED ORDER — DOXYCYCLINE HYCLATE 100 MG PO TABS
100.0000 mg | ORAL_TABLET | Freq: Two times a day (BID) | ORAL | 0 refills | Status: AC
  Filled 2023-02-15: qty 14, 7d supply, fill #0

## 2023-02-15 MED ORDER — METOPROLOL TARTRATE 50 MG PO TABS
50.0000 mg | ORAL_TABLET | Freq: Two times a day (BID) | ORAL | 0 refills | Status: DC
  Filled 2023-02-15: qty 60, 30d supply, fill #0

## 2023-02-15 MED ORDER — APIXABAN 5 MG PO TABS
5.0000 mg | ORAL_TABLET | Freq: Two times a day (BID) | ORAL | Status: DC
  Administered 2023-02-15: 5 mg via ORAL
  Filled 2023-02-15: qty 1

## 2023-02-15 NOTE — Plan of Care (Signed)
  Problem: Education: Goal: Ability to describe self-care measures that may prevent or decrease complications (Diabetes Survival Skills Education) will improve Outcome: Adequate for Discharge Goal: Individualized Educational Video(s) Outcome: Adequate for Discharge   Problem: Coping: Goal: Ability to adjust to condition or change in health will improve Outcome: Adequate for Discharge   Problem: Fluid Volume: Goal: Ability to maintain a balanced intake and output will improve Outcome: Adequate for Discharge   Problem: Health Behavior/Discharge Planning: Goal: Ability to identify and utilize available resources and services will improve Outcome: Adequate for Discharge Goal: Ability to manage health-related needs will improve Outcome: Adequate for Discharge   Problem: Metabolic: Goal: Ability to maintain appropriate glucose levels will improve Outcome: Adequate for Discharge   Problem: Nutritional: Goal: Maintenance of adequate nutrition will improve Outcome: Adequate for Discharge Goal: Progress toward achieving an optimal weight will improve Outcome: Adequate for Discharge   Problem: Skin Integrity: Goal: Risk for impaired skin integrity will decrease Outcome: Adequate for Discharge   Problem: Tissue Perfusion: Goal: Adequacy of tissue perfusion will improve Outcome: Adequate for Discharge   Problem: Education: Goal: Knowledge of General Education information will improve Description: Including pain rating scale, medication(s)/side effects and non-pharmacologic comfort measures Outcome: Adequate for Discharge   Problem: Health Behavior/Discharge Planning: Goal: Ability to manage health-related needs will improve Outcome: Adequate for Discharge   Problem: Clinical Measurements: Goal: Ability to maintain clinical measurements within normal limits will improve Outcome: Adequate for Discharge Goal: Will remain free from infection Outcome: Adequate for Discharge Goal:  Diagnostic test results will improve Outcome: Adequate for Discharge Goal: Respiratory complications will improve Outcome: Adequate for Discharge Goal: Cardiovascular complication will be avoided Outcome: Adequate for Discharge   Problem: Activity: Goal: Risk for activity intolerance will decrease Outcome: Adequate for Discharge   Problem: Nutrition: Goal: Adequate nutrition will be maintained Outcome: Adequate for Discharge   Problem: Coping: Goal: Level of anxiety will decrease Outcome: Adequate for Discharge   Problem: Elimination: Goal: Will not experience complications related to bowel motility Outcome: Adequate for Discharge Goal: Will not experience complications related to urinary retention Outcome: Adequate for Discharge   Problem: Pain Managment: Goal: General experience of comfort will improve and/or be controlled Outcome: Adequate for Discharge   Problem: Safety: Goal: Ability to remain free from injury will improve Outcome: Adequate for Discharge   Problem: Skin Integrity: Goal: Risk for impaired skin integrity will decrease Outcome: Adequate for Discharge   Problem: Clinical Measurements: Goal: Ability to avoid or minimize complications of infection will improve Outcome: Adequate for Discharge   Problem: Skin Integrity: Goal: Skin integrity will improve Outcome: Adequate for Discharge   Problem: Education: Goal: Knowledge of disease or condition will improve Outcome: Adequate for Discharge Goal: Understanding of medication regimen will improve Outcome: Adequate for Discharge Goal: Individualized Educational Video(s) Outcome: Adequate for Discharge   Problem: Activity: Goal: Ability to tolerate increased activity will improve Outcome: Adequate for Discharge   Problem: Cardiac: Goal: Ability to achieve and maintain adequate cardiopulmonary perfusion will improve Outcome: Adequate for Discharge   Problem: Health Behavior/Discharge  Planning: Goal: Ability to safely manage health-related needs after discharge will improve Outcome: Adequate for Discharge

## 2023-02-15 NOTE — Discharge Instructions (Addendum)
   [facility]  Discharge Instructions  Lower Extremity Angiogram; Angioplasty/Stenting  Please refer to the following instructions for your post-procedure care. Your surgeon or physician assistant will discuss any changes with you.  Activity  Avoid lifting more than 8 pounds (1 gallons of milk) for 72 hours (3 days) after your procedure. You may walk as much as you can tolerate. It's OK to drive after 72 hours.  Bathing/Showering  You may shower the day after your procedure. If you have a bandage, you may remove it at 24- 48 hours. Clean your incision site with mild soap and water. Pat the area dry with a clean towel.  Diet  Resume your pre-procedure diet. There are no special food restrictions following this procedure. All patients with peripheral vascular disease should follow a low fat/low cholesterol diet. In order to heal from your surgery, it is CRITICAL to get adequate nutrition. Your body requires vitamins, minerals, and protein. Vegetables are the best source of vitamins and minerals. Vegetables also provide the perfect balance of protein. Processed food has little nutritional value, so try to avoid this.  Medications  Resume taking all of your medications unless your doctor tells you not to. If your incision is causing pain, you may take over-the-counter pain relievers such as acetaminophen (Tylenol)  Follow Up  Follow up will be arranged at the time of your procedure. You may have an office visit scheduled or may be scheduled for surgery. Ask your surgeon if you have any questions.  Please call us immediately for any of the following conditions: Severe or worsening pain your legs or feet at rest or with walking. Increased pain, redness, drainage at your groin puncture site. Fever of 101 degrees or higher. If you have any mild or slow bleeding from your puncture site: lie down, apply firm constant pressure over the area with a piece of gauze  or a clean wash cloth for 30 minutes- no peeking!, call 911 right away if you are still bleeding after 30 minutes, or if the bleeding is heavy and unmanageable.  Reduce your risk factors of vascular disease:  Stop smoking. If you would like help call [facility] at 1-800-QUIT-NOW ((403) 593-1651) or [place] at 747-622-6012. Manage your cholesterol Maintain a desired weight Control your diabetes Keep your blood pressure down Please take aspirin and Plavix along with Eliquis, all 3 medications for 1 month, and then stop Plavix but continue to take aspirin and Eliquis. If you have any questions, please call the office at [phone]

## 2023-02-15 NOTE — Discharge Summary (Signed)
 Physician Discharge Summary  Donald Rios FMW:[id number] DOB: 30-May-1940 DOA: 02/08/2023  PCP: Duanne Butler DASEN, MD  Admit date: 02/08/2023 Discharge date: 02/15/2023 30 Day Unplanned Readmission Risk Score    Flowsheet Row ED to Hosp-Admission (Current) from 02/08/2023 in [facility] 4E CV SURGICAL PROGRESSIVE CARE  30 Day Unplanned Readmission Risk Score (%) 20.81 Filed at 02/15/2023 0802       This score is the patient's risk of an unplanned readmission within 30 days of being discharged (0 -100%). The score is based on dignosis, age, lab data, medications, orders, and past utilization.   Low:  0-14.9   Medium: 15-21.9   High: 22-29.9   Extreme: 30 and above          Admitted From: Home Disposition: Home  Recommendations for Outpatient Follow-up:  Follow up with PCP in 1-2 weeks Please obtain BMP/CBC in one week Follow-up with Dr. Harden in 1 to 2 weeks, he has scheduled appointment for you. Follow-up with vascular surgery in few weeks, they will schedule open 40 Please take all 3 blood thinners, aspirin , Plavix  and Eliquis  for 3 months and then stop Plavix  but continue to take Eliquis  and aspirin . Please follow up with your PCP on the following pending results: Unresulted Labs (From admission, onward)    None         Home Health: Yes Equipment/Devices: None  Discharge Condition: Stable CODE STATUS: Full code Diet recommendation: Cardiac  Subjective: Seen and, patient has no complaints.  He says that his left foot is feeling well and he could tell the difference right after the procedure/angiogram yesterday.  He is happy to go home today.  Brief/Interim Summary: 83 year old gentleman with history of chronic A-fib on Eliquis , chronic systolic heart failure, coronary artery disease, type 2 diabetes on glipizide , GERD. Patient recently developed temporal arteritis and was on high-dose steroids and currently on tapering his steroids. While on high-dose steroids he started  developing wound on his dorsum of the left foot about 2 months ago. Followed by wound care, multiple rounds of antibiotics. Recently treated with Augmentin . Continued to have more pain, redness so sent to ER from wound care center. At the emergency room initially hypotensive improved with IV fluids. He was in A-fib. Leukocyte count 31,000. Blood sugar 235. Creatinine 1.56, lactic acid 3.2. Blood cultures were collected. Started on broad-spectrum antibiotics.  Admitted under hospital service, further details below.  Sepsis present on admission secondary to left lower extremity cellulitis, chronic left lower extremity wound with cellulitis POA x 2 mths. He has failed multiple outpatient antibiotic courses, started on vancomycin  and Rocephin  however on 02/14/2023 pain is worse and developed some leukocytosis hence Rocephin  has been switched to meropenem , cultures thus far remain negative, sepsis pathophysiology has clinically resolved. seen by vascular surgery and Dr. Harden orthopedics, lower extremity venous duplex unremarkable, ABIs suggest bilateral PAD, underwent left lower extremity angiogram 02/14/2023, left lower extremity well-perfused and from and brisk Doppler signals and wounds already appear to be improving.  Vascular surgery cleared the patient for discharge with instructions to continue DAPT along with Eliquis  for 1 month followed by aspirin  and Eliquis , follow-up with Dr. Harden as outpatient for wound care and they have set up appointments for him to follow-up with vascular as well as orthopedics.  Discharging on 7 days of doxycycline .  Still has leukocytosis but that is stable and he is afebrile.  Hopefully this will improve over the course of next few days.   Acute kidney injury: Resuscitated.  Improved with hydration.   Chronic A-fib with Chad vas 2 score of greater than 3: Rate controlled now.  Transitioning back to Eliquis  today.  Was on Lopressor  100 mg p.o. twice daily but blood pressure remained  low so this was reduced to 50 p.o. twice daily and is being discharged on 50 p.o. twice daily.  Rates controlled.   Chronic systolic heart failure: Currently euvolemic.  He was given IV fluids overnight.  Known ejection fraction of 25%.  Blood pressure low normal, discontinuing losartan  but resuming rest of the medications.   PAD/coronary artery disease status post CABG: Stable.    GERD: On PPI.  Continue.   Chronic oral steroid use.  Resume PTA tapering dose.   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  Per wound care team.     Type 2 diabetes with hyperglycemia: Resume PTA medications.   Discharge Diagnoses:  Principal Problem:   Sepsis due to cellulitis [facility]) Active Problems:   DM2 (diabetes mellitus, type 2) (HCC)   Chronic systolic congestive heart failure, NYHA class 1 (HCC)   Hx of CABG   Atrial fibrillation, chronic (HCC)   Cellulitis and abscess of foot   Gangrene of toe of left foot (HCC)    Discharge Instructions   Allergies as of 02/15/2023   No Known Allergies      Medication List     STOP taking these medications    amoxicillin -clavulanate 875-125 MG tablet Commonly known as: AUGMENTIN    losartan  25 MG tablet Commonly known as: COZAAR        TAKE these medications    allopurinol  300 MG tablet Commonly known as: ZYLOPRIM  TAKE 1 TABLET BY MOUTH DAILY What changed: when to take this   amiodarone  200 MG tablet Commonly known as: PACERONE  Take 1 tablet by mouth twice daily for 14 days then decrease to 1 tablet once daily.   apixaban  5 MG Tabs tablet Commonly known as: ELIQUIS  Take 1 tablet (5 mg total) by mouth 2 (two) times daily.   aspirin  EC 81 MG tablet Take 1 tablet (81 mg total) by mouth daily. Swallow whole. Start taking on: February 16, 2023   clopidogrel  75 MG tablet Commonly known as: PLAVIX  Take 1 tablet (75 mg total) by mouth daily. Start taking on: February 16, 2023   doxazosin  2 MG tablet Commonly known as:  CARDURA  TAKE 1 TABLET BY MOUTH DAILY What changed: when to take this   doxycycline  100 MG tablet Commonly known as: VIBRA -TABS Take 1 tablet (100 mg total) by mouth 2 (two) times daily for 7 days.   Fiasp  FlexTouch 100 UNIT/ML FlexTouch Pen Generic drug: insulin  aspart Before each meal 3 times a day, 140-199 - 2 units, 200-250 - 4 units, 251-299 - 6 units,  300-349 - 8 units,  350 or above 10 units. What changed:  how much to take how to take this when to take this reasons to take this   furosemide  40 MG tablet Commonly known as: LASIX  Take 1 tablet (40 mg total) by mouth 2 (two) times daily.   glipiZIDE  5 MG 24 hr tablet Commonly known as: glipiZIDE  XL Take 1 tablet (5 mg total) by mouth daily with breakfast.   Jardiance  10 MG Tabs tablet Generic drug: empagliflozin  Take 1 tablet (10 mg total) by mouth daily.   metoprolol  tartrate 50 MG tablet Commonly known as: LOPRESSOR  Take 1 tablet (50 mg total) by mouth 2 (two) times daily. What changed: how much to take   mirtazapine   30 MG tablet Commonly known as: REMERON  TAKE 1 TABLET BY MOUTH EVERY NIGHT AT BEDTIME FOR APPETITE   pantoprazole  40 MG tablet Commonly known as: PROTONIX  Take 1 tablet (40 mg total) by mouth daily.   predniSONE  5 MG tablet Commonly known as: DELTASONE  Take 2.5 mg by mouth daily with breakfast.   silver sulfADIAZINE 1 % cream Commonly known as: SILVADENE Apply 1 Application topically daily.   simvastatin  40 MG tablet Commonly known as: ZOCOR  TAKE ONE TABLET BY MOUTH DAILY What changed: when to take this   spironolactone  25 MG tablet Commonly known as: ALDACTONE  Take 1 tablet (25 mg total) by mouth 2 (two) times daily.        Follow-up Information     [facility] Follow up in 6 week(s).   Specialty: Vascular Surgery Why: Office will call you to arrange your appt (sent). Contact information: [address] [place]   [address] [phone]        Harden Jerona GAILS, MD Follow up in 1 week(s).   Specialty: Orthopedic Surgery Contact information: [address] [place] [address] [phone]         Duanne Butler DASEN, MD Follow up in 1 week(s).   Specialty: Family Medicine Contact information: [address] [place] [address] [phone]                No Known Allergies  Consultations: Orthopedics and vascular surgery   Procedures/Studies: PERIPHERAL VASCULAR CATHETERIZATION Result Date: 02/14/2023 Images from the original result were not included. 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  ABIs demonstrated noncompressible vessels bilaterally with an absent toe pressure on the left.  Risks and benefits of angiogram were reviewed, patient expressed understanding and elected to proceed. Findings: Widely patent aorta and bilateral renal arteries.  Widely patent iliac systems bilaterally. Left common femoral artery with some atherosclerosis but no apparent stenosis.  Widely patent profunda.  Widely patent SFA approximately with a focal severe approximate 90% stenosis at the adductor canal.  Widely patent popliteal artery with three-vessel runoff.  Procedure:  The patient was identified in the holding area and taken to the cath lab  The patient  was then placed supine on the table and prepped and draped in the usual sterile fashion.  A time out was called.  Ultrasound was used to evaluate the right common femoral artery.  It was patent .  A digital ultrasound image was acquired.  A micropuncture needle was used to access the right common femoral artery under ultrasound guidance.  An 018 wire was advanced without resistance and a micropuncture sheath was placed.  The 018 wire was removed and a benson wire was placed.  The micropuncture sheath was exchanged for a 5 french sheath.  An omniflush catheter was advanced over the wire to the level of L-1.  An abdominal angiogram was obtained.  Next,  using the omniflush catheter and a glide advantage wire, the aortic bifurcation was crossed and the catheter was placed into theleft external iliac artery and left runoff was obtained. This demonstrated the above findings.  The glide advantage wire was placed to the catheter and into the SFA.  The patient was systemically heparinized.  The short 5 French sheath was exchanged for a 6 French by 60 cm catapult sheath.  The SFA lesion was crossed with the glide advantage wire and a 90 cm quick cross.  Angiography via the catheter demonstrated true lumen crossing.  The lesion was then treated with a 7 x 40 Eluvia stent which was postdilated with a 7 x 40 Mustang.  Completion angiography demonstrated an excellent response with minimal residual stenosis and preserved runoff.  The long 6 French sheath was then exchanged for a short 6 French sheath and a minx closure device was deployed with excellent hemostasis. Contrast: 48 cc Sedation: 36 minutes Impression: Excellent response to stenting of distal SFA lesion, maximally revascularized with inline flow to the foot via all 3 tibials Norman GORMAN Serve MD [facility] Office: [phone]   VAS US  LOWER EXTREMITY ARTERIAL DUPLEX Result Date: 02/12/2023 LOWER EXTREMITY ARTERIAL DUPLEX STUDY Patient  Name:  Donald Rios  Date of Exam:   02/12/2023 Medical Rec #: [id number]       Accession #:    [id number] Date of Birth: Jan 02, 1941       Patient Gender: M Patient Age:   26 years Exam Location:  [facility] Procedure:      VAS US  LOWER EXTREMITY ARTERIAL DUPLEX Referring Phys: PENNE COLORADO --------------------------------------------------------------------------------  Indications: Ulceration. High Risk Factors: Hypertension, hyperlipidemia, Diabetes, coronary artery                    disease. Other Factors: Cardiomyopathy, permanent Afib, CABG, history of temporal                arteritis.  Current ABI: Bilateral non compressible TBI: R:0.51 L: absent Comparison Study: No prior study on file Performing Technologist: Alberta Lis RVS  Examination Guidelines: A complete evaluation includes B-mode imaging, spectral Doppler, color Doppler, and power Doppler as needed of all accessible portions of each vessel. Bilateral testing is considered an integral part of a complete examination. Limited examinations for reoccurring indications may be performed as noted.   +-----------+--------+-----+---------------+-------------------+--------+ LEFT       PSV cm/sRatioStenosis       Waveform           Comments +-----------+--------+-----+---------------+-------------------+--------+ CFA Prox   87                          multiphasic                 +-----------+--------+-----+---------------+-------------------+--------+ CFA Distal 89                          multiphasic                 +-----------+--------+-----+---------------+-------------------+--------+ DFA        152          30-49% stenosis                            +-----------+--------+-----+---------------+-------------------+--------+ SFA Prox   124  multiphasic                 +-----------+--------+-----+---------------+-------------------+--------+ SFA Mid    204          50-74%  stenosismonophasic                  +-----------+--------+-----+---------------+-------------------+--------+ SFA Distal 407          75-99% stenosismonophasic                  +-----------+--------+-----+---------------+-------------------+--------+ POP Prox   66                          monophasic                  +-----------+--------+-----+---------------+-------------------+--------+ POP Mid    58                          monophasic                  +-----------+--------+-----+---------------+-------------------+--------+ POP Distal 103          30-49% stenosismonophasic                  +-----------+--------+-----+---------------+-------------------+--------+ ATA Prox   61                          monophasic                  +-----------+--------+-----+---------------+-------------------+--------+ ATA Mid    68                          monophasic                  +-----------+--------+-----+---------------+-------------------+--------+ ATA Distal 111          30-49% stenosismonophasic                  +-----------+--------+-----+---------------+-------------------+--------+ PTA Prox   26                          dampened monophasic         +-----------+--------+-----+---------------+-------------------+--------+ PTA Mid    25                          dampened monophasic         +-----------+--------+-----+---------------+-------------------+--------+ PTA Distal 172          30-49% stenosismonophasic                  +-----------+--------+-----+---------------+-------------------+--------+ PERO Prox  88                          monophasic                  +-----------+--------+-----+---------------+-------------------+--------+ PERO Mid   62                          monophasic                  +-----------+--------+-----+---------------+-------------------+--------+ PERO Distal66                          monophasic                   +-----------+--------+-----+---------------+-------------------+--------+  Summary: Left: 30-49% stenosis noted in the deep femoral artery. 50-74% stenosis noted in the superficial femoral artery. 75-99% stenosis noted in the superficial femoral artery and/or popliteal artery. 30-49% stenosis noted in the popliteal artery. 30-49% stenosis noted in the anterior tibial artery. 30-49% stenosis noted in the posterior tibial artery. Diffuse calcific plaque noted throughout the left lower extremity arteries.  See table(s) above for measurements and observations. Electronically signed by Penne Colorado MD on 02/12/2023 at 4:02:19 PM.    Final    VAS US  ABI WITH/WO TBI Result Date: 02/10/2023  LOWER EXTREMITY DOPPLER STUDY Patient Name:  Donald Rios  Date of Exam:   02/10/2023 Medical Rec #: [id number]       Accession #:    [id number] Date of Birth: Dec 09, 1940       Patient Gender: M Patient Age:   60 years Exam Location:  [facility] Procedure:      VAS US  ABI WITH/WO TBI Referring Phys: LAVADA [facility] --------------------------------------------------------------------------------  Indications: Ulceration. Cellulitis, abscess, sepsis High Risk         Hypertension, hyperlipidemia, Diabetes, coronary artery Factors:          disease. Other Factors: Cardiomyopathy, CHF, history of CABG.  Limitations: Today's exam was limited due to rapid atrial fibrillation. Comparison Study: No prior study on file Performing Technologist: Rachel Pellet RVS  Examination Guidelines: A complete evaluation includes at minimum, Doppler waveform signals and systolic blood pressure reading at the level of bilateral brachial, anterior tibial, and posterior tibial arteries, when vessel segments are accessible. Bilateral testing is considered an integral part of a complete examination. Photoelectric Plethysmograph (PPG) waveforms and toe systolic pressure readings are included as required and additional duplex testing as  needed. Limited examinations for reoccurring indications may be performed as noted.  ABI Findings: +---------+------------------+-----+--------+--------+ Right    Rt Pressure (mmHg)IndexWaveformComment  +---------+------------------+-----+--------+--------+ Brachial 114                                     +---------+------------------+-----+--------+--------+ PTA      255               2.24 biphasic         +---------+------------------+-----+--------+--------+ DP       255               2.24 biphasic         +---------+------------------+-----+--------+--------+ Great Toe58                0.51                  +---------+------------------+-----+--------+--------+ +---------+------------------+-----+--------+-------+ Left     Lt Pressure (mmHg)IndexWaveformComment +---------+------------------+-----+--------+-------+ Brachial 113                                    +---------+------------------+-----+--------+-------+ PTA      255               2.24 biphasic        +---------+------------------+-----+--------+-------+ DP       255               2.24 biphasic        +---------+------------------+-----+--------+-------+ Great Toe0                 0.00 Absent          +---------+------------------+-----+--------+-------+ +-------+---------------------+-----------+------------+------------+  ABI/TBIToday's ABI          Today's TBIPrevious ABIPrevious TBI +-------+---------------------+-----------+------------+------------+ Right  2.24 non compressible0.51                                +-------+---------------------+-----------+------------+------------+ Left   2.24 non compressibleabsent                              +-------+---------------------+-----------+------------+------------+ Arterial wall calcification precludes accurate ankle pressures and ABIs.  Summary: Right: Resting right ankle-brachial index indicates noncompressible right  lower extremity arteries. The right toe-brachial index is abnormal. Left: Resting left ankle-brachial index indicates noncompressible left lower extremity arteries. Unable to obtain left TBI secondary to absent pulse. *See table(s) above for measurements and observations.  Electronically signed by Lonni Gaskins MD on 02/10/2023 at 1:02:44 PM.    Final    VAS US  LOWER EXTREMITY VENOUS (DVT) Result Date: 02/09/2023  Lower Venous DVT Study Patient Name:  Donald Rios  Date of Exam:   02/09/2023 Medical Rec #: [id number]       Accession #:    [id number] Date of Birth: March 24, 1940       Patient Gender: M Patient Age:   27 years Exam Location:  [facility] Procedure:      VAS US  LOWER EXTREMITY VENOUS (DVT) Referring Phys: JUSTUS POOR --------------------------------------------------------------------------------  Indications: Swelling. Other Indications: Cellulitis. Comparison Study: No previous exams Performing Technologist: Jody Hill RVT, RDMS  Examination Guidelines: A complete evaluation includes B-mode imaging, spectral Doppler, color Doppler, and power Doppler as needed of all accessible portions of each vessel. Bilateral testing is considered an integral part of a complete examination. Limited examinations for reoccurring indications may be performed as noted. The reflux portion of the exam is performed with the patient in reverse Trendelenburg.  +-----+---------------+---------+-----------+----------+--------------+ RIGHTCompressibilityPhasicitySpontaneityPropertiesThrombus Aging +-----+---------------+---------+-----------+----------+--------------+ CFV  Full           Yes      Yes                                 +-----+---------------+---------+-----------+----------+--------------+   +---------+---------------+---------+-----------+----------+--------------+ LEFT     CompressibilityPhasicitySpontaneityPropertiesThrombus Aging  +---------+---------------+---------+-----------+----------+--------------+ CFV      Full           Yes      Yes                                 +---------+---------------+---------+-----------+----------+--------------+ SFJ      Full                                                        +---------+---------------+---------+-----------+----------+--------------+ FV Prox  Full           Yes      Yes                                 +---------+---------------+---------+-----------+----------+--------------+ FV Mid   Full           Yes      Yes                                 +---------+---------------+---------+-----------+----------+--------------+  FV DistalFull           Yes      Yes                                 +---------+---------------+---------+-----------+----------+--------------+ PFV      Full                                                        +---------+---------------+---------+-----------+----------+--------------+ POP      Full           Yes      Yes                                 +---------+---------------+---------+-----------+----------+--------------+ PTV      Full                                                        +---------+---------------+---------+-----------+----------+--------------+ PERO     Full                                                        +---------+---------------+---------+-----------+----------+--------------+    Summary: RIGHT: - No evidence of common femoral vein obstruction.   LEFT: - There is no evidence of deep vein thrombosis in the lower extremity.  - No cystic structure found in the popliteal fossa. Subcutaneous edema seen in area of calf and ankle.  *See table(s) above for measurements and observations. Electronically signed by Debby Robertson on 02/09/2023 at 4:11:54 PM.    Final    DG Foot Complete Left Result Date: 02/08/2023 CLINICAL DATA:  Rule out osteomyelitis.  Left foot wound. EXAM: LEFT  FOOT - COMPLETE 3+ VIEW COMPARISON:  None Available. FINDINGS: There is a bandage along the dorsal aspect of the foot. Negative for acute fracture or dislocation. No evidence for cortical destruction or periosteal reaction. Spurring involving the calcaneus. Atherosclerotic calcifications in left foot. IMPRESSION: 1. No acute bone abnormality to the left foot. 2. No radiographic evidence for osteomyelitis. 3. Atherosclerotic calcifications. Electronically Signed   By: Juliene Balder M.D.   On: 02/08/2023 17:11     Discharge Exam: Vitals:   02/15/23 0746 02/15/23 0846  BP: 138/82 107/65  Pulse: 98   Resp: 16   Temp: 97.6 F (36.4 C)   SpO2: 100%    Vitals:   02/15/23 0230 02/15/23 0335 02/15/23 0746 02/15/23 0846  BP: 118/62 125/73 138/82 107/65  Pulse: 97 91 98   Resp: 16 16 16    Temp:  97.7 F (36.5 C) 97.6 F (36.4 C)   TempSrc:  Oral Oral   SpO2: 97% 99% 100%   Weight:      Height:        General: Pt is alert, awake, not in acute distress Cardiovascular: Currently regular RR, S1/S2 +, no rubs, no gallops Respiratory: CTA bilaterally, no wheezing, no rhonchi Abdominal:  Soft, NT, ND, bowel sounds + Extremities: Dressing in the left foot.    The results of significant diagnostics from this hospitalization (including imaging, microbiology, ancillary and laboratory) are listed below for reference.     Microbiology: Recent Results (from the past 240 hours)  Blood culture (routine x 2)     Status: None   Collection Time: 02/08/23  3:53 PM   Specimen: BLOOD RIGHT ARM  Result Value Ref Range Status   Specimen Description BLOOD RIGHT ARM  Final   Special Requests   Final    BOTTLES DRAWN AEROBIC AND ANAEROBIC Blood Culture adequate volume   Culture   Final    NO GROWTH 5 DAYS Performed at [facility], [address], [place], [place] [address]    Report Status 02/13/2023 FINAL  Final  Blood culture (routine x 2)     Status: None   Collection Time: 02/08/23  3:58 PM    Specimen: BLOOD LEFT ARM  Result Value Ref Range Status   Specimen Description BLOOD LEFT ARM  Final   Special Requests   Final    BOTTLES DRAWN AEROBIC AND ANAEROBIC Blood Culture adequate volume   Culture   Final    NO GROWTH 5 DAYS Performed at [facility], [address], [place], [place] [address]    Report Status 02/13/2023 FINAL  Final  MRSA Next Gen by PCR, Nasal     Status: None   Collection Time: 02/10/23  5:47 AM   Specimen: Nasal Mucosa; Nasal Swab  Result Value Ref Range Status   MRSA by PCR Next Gen NOT DETECTED NOT DETECTED Final    Comment: (NOTE) The GeneXpert MRSA Assay (FDA approved for NASAL specimens only), is one component of a comprehensive MRSA colonization surveillance program. It is not intended to diagnose MRSA infection nor to guide or monitor treatment for MRSA infections. Test performance is not FDA approved in patients less than 79 years old. Performed at [facility], [address], [place], [place] [address]      Labs: BNP (last 3 results) Recent Labs    08/16/22 0710 11/09/22 1610 11/23/22 1120  BNP 565.9* 162* 390*   Basic Metabolic Panel: Recent Labs  Lab 02/10/23 0422 02/11/23 0520 02/12/23 0520 02/13/23 0518 02/14/23 0459 02/15/23 0414  NA 139 138 138 137 139 140  K 4.2 4.1 3.9 4.0 4.4 4.7  CL 107 108 110 109 107 107  CO2 22 23 21* 22 25 25   GLUCOSE 100* 105* 162* 159* 124* 122*  BUN 12 12 14 13 15 15   CREATININE 1.08 1.00 1.01 0.96 1.04 0.97  CALCIUM  8.4* 8.5* 8.4* 8.7* 9.0 8.9  MG 2.2 2.3 2.1 2.1 2.1  --   PHOS  --  3.0 3.1 3.0 2.8  --    Liver Function Tests: Recent Labs  Lab 02/08/23 1624 02/10/23 0422  AST 26 15  ALT 30 20  ALKPHOS 79 64  BILITOT 0.9 0.8  PROT 5.7* 4.2*  ALBUMIN  3.6 2.5*   No results for input(s): LIPASE, AMYLASE in the last 168 hours. No results for input(s): AMMONIA in the last 168 hours. CBC: Recent Labs  Lab 02/11/23 0520 02/12/23 0520 02/13/23 0518  02/14/23 0459 02/15/23 0414  WBC 15.0* 13.6* 14.4* 17.5* 17.6*  NEUTROABS 11.7* 10.7* 11.2* 13.0* 13.7*  HGB 10.0* 9.8* 10.6* 10.8* 11.4*  HCT 30.6* 29.8* 32.5* 32.3* 34.9*  MCV 105.9* 106.0* 105.5* 105.2* 105.4*  PLT 114* 131* PLATELET  CLUMPS NOTED ON SMEAR, UNABLE TO ESTIMATE 157 194   Cardiac Enzymes: No results for input(s): CKTOTAL, CKMB, CKMBINDEX, TROPONINI in the last 168 hours. BNP: Invalid input(s): POCBNP CBG: Recent Labs  Lab 02/14/23 1004 02/14/23 1217 02/14/23 1741 02/14/23 2101 02/15/23 0609  GLUCAP 117* 136* 190* 210* 108*   D-Dimer No results for input(s): DDIMER in the last 72 hours. Hgb A1c No results for input(s): HGBA1C in the last 72 hours. Lipid Profile No results for input(s): CHOL, HDL, LDLCALC, TRIG, CHOLHDL, LDLDIRECT in the last 72 hours. Thyroid  function studies No results for input(s): TSH, T4TOTAL, T3FREE, THYROIDAB in the last 72 hours.  Invalid input(s): FREET3 Anemia work up No results for input(s): VITAMINB12, FOLATE, FERRITIN, TIBC, IRON, RETICCTPCT in the last 72 hours. Urinalysis    Component Value Date/Time   COLORURINE YELLOW 02/09/2023 0915   APPEARANCEUR CLEAR 02/09/2023 0915   LABSPEC 1.018 02/09/2023 0915   PHURINE 5.0 02/09/2023 0915   GLUCOSEU NEGATIVE 02/09/2023 0915   HGBUR NEGATIVE 02/09/2023 0915   BILIRUBINUR NEGATIVE 02/09/2023 0915   KETONESUR NEGATIVE 02/09/2023 0915   PROTEINUR NEGATIVE 02/09/2023 0915   UROBILINOGEN 0.2 05/06/2013 2202   NITRITE NEGATIVE 02/09/2023 0915   LEUKOCYTESUR NEGATIVE 02/09/2023 0915   Sepsis Labs Recent Labs  Lab 02/12/23 0520 02/13/23 0518 02/14/23 0459 02/15/23 0414  WBC 13.6* 14.4* 17.5* 17.6*   Microbiology Recent Results (from the past 240 hours)  Blood culture (routine x 2)     Status: None   Collection Time: 02/08/23  3:53 PM   Specimen: BLOOD RIGHT ARM  Result Value Ref Range Status   Specimen Description BLOOD  RIGHT ARM  Final   Special Requests   Final    BOTTLES DRAWN AEROBIC AND ANAEROBIC Blood Culture adequate volume   Culture   Final    NO GROWTH 5 DAYS Performed at [facility], [address] [place], [place] [address]    Report Status 02/13/2023 FINAL  Final  Blood culture (routine x 2)     Status: None   Collection Time: 02/08/23  3:58 PM   Specimen: BLOOD LEFT ARM  Result Value Ref Range Status   Specimen Description BLOOD LEFT ARM  Final   Special Requests   Final    BOTTLES DRAWN AEROBIC AND ANAEROBIC Blood Culture adequate volume   Culture   Final    NO GROWTH 5 DAYS Performed at [facility], [address], [place], [place] [address]    Report Status 02/13/2023 FINAL  Final  MRSA Next Gen by PCR, Nasal     Status: None   Collection Time: 02/10/23  5:47 AM   Specimen: Nasal Mucosa; Nasal Swab  Result Value Ref Range Status   MRSA by PCR Next Gen NOT DETECTED NOT DETECTED Final    Comment: (NOTE) The GeneXpert MRSA Assay (FDA approved for NASAL specimens only), is one component of a comprehensive MRSA colonization surveillance program. It is not intended to diagnose MRSA infection nor to guide or monitor treatment for MRSA infections. Test performance is not FDA approved in patients less than 46 years old. Performed at [facility], [address] [place], [place] [address]     FURTHER DISCHARGE INSTRUCTIONS:   Get Medicines reviewed and adjusted: Please take all your medications with you for your next visit with your Primary MD   Laboratory/radiological data: Please request your Primary MD to go over all hospital tests and procedure/radiological results at the follow up, please  ask your Primary MD to get all Hospital records sent to his/her office.   In some cases, they will be blood work, cultures and biopsy results pending at the time of your discharge. Please request that your primary care M.D. goes through all the records of your hospital  data and follows up on these results.   Also Note the following: If you experience worsening of your admission symptoms, develop shortness of breath, life threatening emergency, suicidal or homicidal thoughts you must seek medical attention immediately by calling 911 or calling your MD immediately  if symptoms less severe.   You must read complete instructions/literature along with all the possible adverse reactions/side effects for all the Medicines you take and that have been prescribed to you. Take any new Medicines after you have completely understood and accpet all the possible adverse reactions/side effects.    Do not drive when taking Pain medications or sleeping medications (Benzodaizepines)   Do not take more than prescribed Pain, Sleep and Anxiety Medications. It is not advisable to combine anxiety,sleep and pain medications without talking with your primary care practitioner   Special Instructions: If you have smoked or chewed Tobacco  in the last 2 yrs please stop smoking, stop any regular Alcohol   and or any Recreational drug use.   Wear Seat belts while driving.   Please note: You were cared for by a hospitalist during your hospital stay. Once you are discharged, your primary care physician will handle any further medical issues. Please note that NO REFILLS for any discharge medications will be authorized once you are discharged, as it is imperative that you return to your primary care physician (or establish a relationship with a primary care physician if you do not have one) for your post hospital discharge needs so that they can reassess your need for medications and monitor your lab values  Time coordinating discharge: Over 30 minutes  SIGNED:   Fredia Skeeter, MD  [facility] 02/15/2023, 10:06 AM *Please note that this is a verbal dictation therefore any spelling or grammatical errors are due to the Dragon Medical One system interpretation. If 7PM-7AM, please  contact night-coverage [email or web address]

## 2023-02-15 NOTE — Progress Notes (Addendum)
  Progress Note    02/15/2023 7:26 AM 1 Day Post-Op  Subjective:  says left foot is feeling better, says he could tell a difference immediately after the procedure yesterday   Vitals:   02/15/23 0230 02/15/23 0335  BP: 118/62 125/73  Pulse: 97 91  Resp: 16 16  Temp:  97.7 F (36.5 C)  SpO2: 97% 99%   Physical Exam: Cardiac:  regular Lungs:  non labored Extremities:  R CF access site with small hematoma present. Area marked. This is not expanding. Overall soft. 2+ femoral pulses bilaterally. Brisk doppler DP/PT/Pero signals. Left dorsal foot wound almost completely healed. Necrosis of great toe superficial, Some epidermolysis Neurologic: alert and oriented  CBC    Component Value Date/Time   WBC 17.6 (H) 02/15/2023 0414   RBC 3.31 (L) 02/15/2023 0414   HGB 11.4 (L) 02/15/2023 0414   HGB 12.7 (L) 09/24/2022 1507   HCT 34.9 (L) 02/15/2023 0414   HCT 39.9 09/24/2022 1507   PLT 194 02/15/2023 0414   PLT 128 (L) 09/24/2022 1507   MCV 105.4 (H) 02/15/2023 0414   MCV 103 (H) 09/24/2022 1507   MCH 34.4 (H) 02/15/2023 0414   MCHC 32.7 02/15/2023 0414   RDW 15.4 02/15/2023 0414   RDW 16.1 (H) 09/24/2022 1507   LYMPHSABS 2.4 02/15/2023 0414   MONOABS 0.9 02/15/2023 0414   EOSABS 0.1 02/15/2023 0414   BASOSABS 0.1 02/15/2023 0414    BMET    Component Value Date/Time   NA 140 02/15/2023 0414   NA 142 08/30/2022 1436   K 4.7 02/15/2023 0414   CL 107 02/15/2023 0414   CO2 25 02/15/2023 0414   GLUCOSE 122 (H) 02/15/2023 0414   BUN 15 02/15/2023 0414   BUN 22 08/30/2022 1436   CREATININE 0.97 02/15/2023 0414   CREATININE 1.11 12/31/2022 1541   CALCIUM  8.9 02/15/2023 0414   GFRNONAA >60 02/15/2023 0414   GFRNONAA 62 09/13/2019 0907   GFRAA 72 09/13/2019 0907    INR    Component Value Date/Time   INR 1.9 (H) 11/16/2022 1239     Intake/Output Summary (Last 24 hours) at 02/15/2023 0726 Last data filed at 02/15/2023 0700 Gross per 24 hour  Intake 1730.38 ml  Output  1500 ml  Net 230.38 ml     Assessment/Plan:  83 y.o. male is s/p Angiogram, left SFA stenting 1 Day Post-Op   R CF access site with small hematoma, soft LLE well perfused and warm with brisk doppler signals  Left foot wounds already appear to be improving. No indication for any intervention. Continue local wound care per Dr. Harden Currently on Heparin  gtt. Continue Statin. Recommend Aspirin  and Plavix  as well x 1 month then Plavix  can be d/c.  Will arrange outpatient follow up in 4-6 weeks with ABI and LLE arterial duplex  Teretha Damme, PA-C [facility] [phone] 02/15/2023 7:26 AM  VASCULAR STAFF ADDENDUM: I have independently interviewed and examined the patient. I agree with the above.  Access site with ecchymosis but soft, and nontender. Multiphasic DP and PT. Outpatient follow up arranged.   Norman GORMAN Serve MD [facility] Phone Number: [phone] 02/15/2023 11:36 AM

## 2023-02-15 NOTE — Progress Notes (Signed)
 PHARMACY - ANTICOAGULATION CONSULT NOTE  Pharmacy Consult for heparin  Indication: atrial fibrillation  Labs: Recent Labs    02/13/23 0518 02/13/23 1245 02/14/23 0459 02/15/23 0414  HGB 10.6*  --  10.8* 11.4*  HCT 32.5*  --  32.3* 34.9*  PLT PLATELET CLUMPS NOTED ON SMEAR, UNABLE TO ESTIMATE  --  157 194  APTT 71* 81* 77* 60*  HEPARINUNFRC 1.10*  --  0.94* 0.66  CREATININE 0.96  --  1.04 0.97   Assessment/Plan:  82yo male subtherapeutic on heparin  after resumed after procedure but was previously stable at current rate and suspect PTT will continue to rise. Will continue infusion at current rate of 850 units/hr and check stable with additional PTT.  Marvetta Dauphin, PharmD, BCPS 02/15/2023 6:50 AM

## 2023-02-15 NOTE — Consult Note (Signed)
 ORTHOPAEDIC CONSULTATION  REQUESTING PHYSICIAN: Vernon Ranks, MD Excellent improvement in the circulation to the left lower extremity. Chief Complaint: Ischemic pain left foot.  HPI: Donald Rios is a 83 y.o. male who presents with ischemic changes to the forefoot and an ischemic ulcer of the dorsum of the left foot.  Patient is status post endovascular revascularization yesterday with excellent improvement in circulation to the left lower extremity with three-vessel runoff.  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    History of MI (myocardial infarction)    History of nuclear stress test 08/07/2010   dipyridamole; EKG negative for ischemia, low risk scan    Hyperlipidemia    Hyperplastic colon polyp 05/2008   Hypertension    Ischemic cardiomyopathy    EF 45%, with inferior wall motion abnormality    Myocardial infarction (HCC)    Valvular regurgitation    mitral and tricuspid (mild)   Past Surgical History:  Procedure Laterality Date   CARDIOVERSION N/A 08/16/2022   Procedure: CARDIOVERSION;  Surgeon: Jeffrie Oneil BROCKS, MD;  Location: [facility] INVASIVE [facility] LAB;  Service: Cardiovascular;  Laterality: N/A;   CORONARY ARTERY BYPASS GRAFT  2000   LIMA to LAD, free IMA to OM2, sequential graft to PLA & PLD   ESOPHAGOGASTRODUODENOSCOPY N/A 05/07/2013   Procedure: ESOPHAGOGASTRODUODENOSCOPY (EGD);  Surgeon: Lupita FORBES Commander, MD;  Location: [facility] ENDOSCOPY;  Service: Endoscopy;  Laterality: N/A;   TEE WITHOUT CARDIOVERSION N/A 08/16/2022   Procedure:  TRANSESOPHAGEAL ECHOCARDIOGRAM;  Surgeon: Jeffrie Oneil BROCKS, MD;  Location: [facility] INVASIVE CV LAB;  Service: Cardiovascular;  Laterality: N/A;   TRANSTHORACIC ECHOCARDIOGRAM  09/07/2010   EF 45-50%; LV systolic function mildly reduced; LA mildly dilated; mild-mod MR & mild-mod TR; aortic root sclerosis/calcification;    Social History   Socioeconomic History   Marital status: Married    Spouse name: Not on file   Number of children: 1   Years of education: Not on file   Highest education level: Not on file  Occupational History   Not on file  Tobacco Use   Smoking status: Former    Current packs/day: 0.00    Types: Cigarettes    Quit date: 11/04/1985    Years since quitting: 37.3   Smokeless tobacco: Never  Vaping Use   Vaping status: Never Used  Substance and Sexual Activity   Alcohol  use: Not Currently    Alcohol /week: 2.0 - 3.0 standard drinks of alcohol     Types: 2 - 3 Cans of beer per week   Drug use: No   Sexual activity: Not on file  Other Topics Concern   Not on file  Social History Narrative   Not on file   Social Drivers of Health   Financial Resource Strain: Low Risk  (08/24/2022)   Overall Financial Resource Strain (CARDIA)    Difficulty of Paying Living Expenses: Not hard at all  Food Insecurity: No Food Insecurity (02/09/2023)   Hunger Vital  Sign    Worried About Programme Researcher, Broadcasting/film/video in the Last Year: Never true    Ran Out of Food in the Last Year: Never true  Transportation Needs: No Transportation Needs (02/09/2023)   PRAPARE - Administrator, Civil Service (Medical): No    Lack of Transportation (Non-Medical): No  Physical Activity: Sufficiently Active (07/10/2021)   Exercise Vital Sign    Days of Exercise per Week: 5 days    Minutes of Exercise per Session: 30 min  Stress: No Stress Concern Present (08/24/2022)   [employer] of Occupational Health - Occupational Stress Questionnaire    Feeling of Stress : Only a little  Social Connections:  Moderately Integrated (02/09/2023)   Social Connection and Isolation Panel [NHANES]    Frequency of Communication with Friends and Family: Twice a week    Frequency of Social Gatherings with Friends and Family: Twice a week    Attends Religious Services: More than 4 times per year    Active Member of [employer] or Organizations: No    Attends Engineer, Structural: Never    Marital Status: Married   Family History  Problem Relation Age of Onset   Stroke Father    Hypertension Father    Diabetes Brother    Esophageal cancer Brother    Stroke Maternal Grandmother    Stroke Maternal Grandfather    Stomach cancer Maternal Aunt    Colon cancer Neg Hx    Rectal cancer Neg Hx    - negative except otherwise stated in the family history section No Known Allergies Prior to Admission medications   Medication Sig Start Date End Date Taking? Authorizing Provider  allopurinol  (ZYLOPRIM ) 300 MG tablet TAKE 1 TABLET BY MOUTH DAILY Patient taking differently: Take 300 mg by mouth at bedtime. 01/28/23  Yes Duanne Butler DASEN, MD  amiodarone  (PACERONE ) 200 MG tablet Take 1 tablet by mouth twice daily for 14 days then decrease to 1 tablet once daily. 01/13/23  Yes Hilty, Vinie BROCKS, MD  amoxicillin -clavulanate (AUGMENTIN ) 875-125 MG tablet Take 1 tablet by mouth 2 (two) times daily. 02/04/23  Yes Duanne Butler DASEN, MD  apixaban  (ELIQUIS ) 5 MG TABS tablet Take 1 tablet (5 mg total) by mouth 2 (two) times daily. 09/20/22  Yes Duanne Butler DASEN, MD  doxazosin  (CARDURA ) 2 MG tablet TAKE 1 TABLET BY MOUTH DAILY Patient taking differently: Take 2 mg by mouth at bedtime. 08/04/22  Yes Duanne Butler DASEN, MD  empagliflozin  (JARDIANCE ) 10 MG TABS tablet Take 1 tablet (10 mg total) by mouth daily. 08/16/22  Yes Henry Manuelita NOVAK, NP  furosemide  (LASIX ) 40 MG tablet Take 1 tablet (40 mg total) by mouth 2 (two) times daily. 11/23/22  Yes Duanne Butler DASEN, MD  glipiZIDE  (GLIPIZIDE  XL) 5 MG 24 hr tablet Take 1 tablet (5 mg  total) by mouth daily with breakfast. 12/21/22  Yes Duanne Butler DASEN, MD  insulin  aspart (FIASP  FLEXTOUCH) 100 UNIT/ML FlexTouch Pen Before each meal 3 times a day, 140-199 - 2 units, 200-250 - 4 units, 251-299 - 6 units,  300-349 - 8 units,  350 or above 10 units. Patient taking differently: Inject 3-7 Units into the skin as needed. Before each meal 3 times a day, 140-199 - 2 units, 200-250 - 4 units, 251-299 - 6 units,  300-349 - 8 units,  350 or above 10 units. 08/16/22  Yes Dennise Lavada POUR, MD  losartan  (COZAAR ) 25 MG tablet Take 1 tablet (25 mg total)  by mouth daily. 08/16/22  Yes Henry Manuelita NOVAK, NP  metoprolol  tartrate (LOPRESSOR ) 50 MG tablet Take 2 tablets (100 mg total) by mouth 2 (two) times daily. 11/25/22  Yes Duanne Butler DASEN, MD  mirtazapine  (REMERON ) 30 MG tablet TAKE 1 TABLET BY MOUTH EVERY NIGHT AT BEDTIME FOR APPETITE 09/02/22  Yes Duanne Butler DASEN, MD  pantoprazole  (PROTONIX ) 40 MG tablet Take 1 tablet (40 mg total) by mouth daily. 07/30/22  Yes Duanne Butler DASEN, MD  predniSONE  (DELTASONE ) 5 MG tablet Take 2.5 mg by mouth daily with breakfast.   Yes [provider]  silver sulfADIAZINE (SILVADENE) 1 % cream Apply 1 Application topically daily. 02/02/23  Yes [provider]  spironolactone  (ALDACTONE ) 25 MG tablet Take 1 tablet (25 mg total) by mouth 2 (two) times daily. 11/23/22 02/21/23 Yes PickardButler DASEN, MD  simvastatin  (ZOCOR ) 40 MG tablet TAKE ONE TABLET BY MOUTH DAILY Patient taking differently: Take 40 mg by mouth at bedtime. 05/17/22   Duanne Butler DASEN, MD   PERIPHERAL VASCULAR CATHETERIZATION Result Date: 02/14/2023 Images from the original result were not included. 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  ABIs demonstrated noncompressible vessels bilaterally with an absent toe pressure on the left.  Risks and benefits of angiogram were reviewed, patient expressed understanding and elected to proceed. Findings: Widely patent aorta and bilateral renal arteries.  Widely patent iliac systems bilaterally. Left common femoral artery with some atherosclerosis but no apparent stenosis.  Widely patent profunda.  Widely patent SFA approximately with a focal severe approximate 90% stenosis at the adductor canal.  Widely patent popliteal artery with three-vessel runoff.  Procedure:  The patient was identified in the holding area and taken to the cath lab  The patient was then placed supine on the table and prepped and draped in the usual sterile fashion.  A time out was called.  Ultrasound was used to evaluate the right common femoral artery.  It was patent .  A digital ultrasound image was acquired.  A micropuncture needle was used to access the right common femoral artery under ultrasound guidance.  An 018 wire was advanced without resistance and a micropuncture sheath was placed.  The 018 wire was removed and a benson wire was placed.  The micropuncture sheath was exchanged for a 5 french sheath.  An omniflush catheter was advanced over the wire to the level of L-1.  An abdominal angiogram was obtained.  Next, using the omniflush catheter and a glide advantage wire, the aortic bifurcation was crossed and the catheter was placed into theleft external iliac artery and left runoff was obtained. This demonstrated the above findings.   The glide advantage wire was placed to the catheter and into the SFA.  The patient was systemically heparinized.  The short 5 French sheath was exchanged for a 6 French by 60 cm catapult sheath.  The SFA lesion was crossed with the glide advantage  wire and a 90 cm quick cross.  Angiography via the catheter demonstrated true lumen crossing.  The lesion was then treated with a 7 x 40 Eluvia stent which was postdilated with a 7 x 40 Mustang.  Completion angiography demonstrated an excellent response with minimal residual stenosis and preserved runoff.  The long 6 French sheath was then exchanged for a short 6 French sheath and a minx closure device was deployed with excellent hemostasis. Contrast: 48 cc Sedation: 36 minutes Impression: Excellent response to stenting of distal SFA lesion, maximally revascularized with inline flow to the foot via all 3 tibials Norman GORMAN Serve MD [facility] Office: [phone]   - pertinent xrays, CT, MRI studies were reviewed and independently interpreted  Positive ROS: All other systems have been reviewed and were otherwise negative with the exception of those mentioned in the HPI and as above.  Physical Exam: General: Alert, no acute distress Psychiatric: Patient is competent for consent with normal mood and affect Lymphatic: No axillary or cervical lymphadenopathy Cardiovascular: No pedal edema Respiratory: No cyanosis, no use of accessory musculature GI: No organomegaly, abdomen is soft and non-tender    Images:  @ENCIMAGES @  Labs:  Lab Results  Component Value Date   HGBA1C 6.2 (H) 02/08/2023   HGBA1C 5.8 (H) 08/14/2022   HGBA1C 5.6 05/14/2022   ESRSEDRATE 2 12/28/2022   ESRSEDRATE 6 11/09/2022   ESRSEDRATE 2 09/06/2022   CRP <0.5 02/14/2023   CRP <0.5 02/13/2023   CRP 0.5 02/12/2023   LABURIC 4.6 04/11/2012   REPTSTATUS 02/13/2023 FINAL 02/08/2023   CULT  02/08/2023    NO GROWTH 5 DAYS Performed at [facility], [address], [place], [place] [address]     Lab Results  Component Value Date   ALBUMIN  2.5 (L) 02/10/2023   ALBUMIN  3.6 02/08/2023   ALBUMIN  3.8 12/02/2022   LABURIC 4.6 04/11/2012        Latest Ref Rng & Units 02/15/2023    4:14 AM 02/14/2023    4:59 AM 02/13/2023    5:18 AM  CBC EXTENDED  WBC 4.0 - 10.5 K/uL 17.6  17.5  14.4   RBC 4.22 - 5.81 MIL/uL 3.31  3.07  3.08   Hemoglobin 13.0 - 17.0 g/dL 88.5  89.1  89.3   HCT 39.0 - 52.0 % 34.9  32.3  32.5   Platelets 150 - 400 K/uL 194  157  PLATELET CLUMPS NOTED ON SMEAR, UNABLE TO ESTIMATE   NEUT# 1.7 - 7.7 K/uL 13.7  13.0  11.2   Lymph# 0.7 - 4.0 K/uL 2.4  2.6  1.8     Neurologic: Patient does not have protective sensation bilateral lower extremities.   MUSCULOSKELETAL:   Skin: Examination the ischemic ulcers to the left forefoot appears superficial there is no cellulitis there is no purulence.  There is dry ischemic peeling skin on the plantar aspect of the foot which also appears superficial there is no fluctuation there is no tenderness to palpation.  The dorsal foot ulcer has healthy granulation tissue and is approximately 50% better than on initial presentation.  White cell count 17.6 this is decreased from initial white cell count of 31.2.  Hemoglobin stable at 11.4.  Most recent hemoglobin A1c 6.2.    MRI scan in December showed no evidence of osteomyelitis.  Assessment: Assessment: Resolving superficial gangrenous changes dorsum of the left foot and left forefoot.  Plan: I will plan to follow-up with the patient in  the office as an outpatient.  Plan for wound care through the office.  Do not see an indication for surgical intervention at this time.  Thank you for the consult and the opportunity to see Mr. Cresencio Reesor, MD [facility] [phone] 8:07 AM

## 2023-02-15 NOTE — Progress Notes (Signed)
 Occupational Therapy Treatment Patient Details Name: Donald Rios MRN: [id number] DOB: 17-Oct-1940 Today's Date: 02/15/2023   History of present illness Patient is an 83 y/o male admitted 02/08/23 with chronic L LE wound now with signs of infection including redness, pain, hypotension and mild tachycardia with CBC noting leukocytosis to 31.2.  Pt underwent revascularization and stenting on 2/3 with improvement noted.  PMH significant for CAD s/p CABG, ischemic cardiomyopathy with EF 45%, DM II, dyslipidemia, HTN, gout, CKD II, a-fib on Eliquis , GERD, cataract, history of GI bleed.   OT comments  Pt with much improved pain from previous session per notes.  No reports of pain during this session.  Supervision level noted for transfers with use of the RW to the sink and the toilet.  Supervision for grooming tasks in standing and for LB dressing tasks.  HR elevated around 100 BPM at rest increasing to 125 with grooming tasks in standing and then over 140 BPM with toileting tasks.  Slight SOB noted with exertion but no report of dizziness or light headedness.  Planned discharge today so will sign off for OT services.  Pt with 24 hour supervision from spouse at home, so will recommend continued acute care OT to progress back to modified independent/independent level.        If plan is discharge home, recommend the following:  A little help with bathing/dressing/bathroom;Help with stairs or ramp for entrance;Assist for transportation   Equipment Recommendations  None recommended by OT       Precautions / Restrictions Precautions Precautions: Fall Restrictions Weight Bearing Restrictions Per Provider Order: No       Mobility Bed Mobility Overal bed mobility: Modified Independent                  Transfers Overall transfer level: Needs assistance Equipment used: Rolling walker (2 wheels) Transfers: Sit to/from Stand, Bed to chair/wheelchair/BSC Sit to Stand: Supervision     Step  pivot transfers: Supervision     General transfer comment: Supervision for ambulation witthin the room with use of the RW for support.     Balance Overall balance assessment: Needs assistance Sitting-balance support: Feet supported, No upper extremity supported Sitting balance-Leahy Scale: Good     Standing balance support: Bilateral upper extremity supported, Reliant on assistive device for balance, During functional activity Standing balance-Leahy Scale: Poor Standing balance comment: Pt needs at least unilateral UE support for balance during mobility.                           ADL either performed or assessed with clinical judgement   ADL Overall ADL's : Needs assistance/impaired     Grooming: Supervision/safety;Standing;Wash/dry face (shaving)               Lower Body Dressing: Supervision/safety;Sit to/from stand Lower Body Dressing Details (indicate cue type and reason): for donning gripper socks Toilet Transfer: Supervision/safety;Regular Toilet;Ambulation;Rolling walker (2 wheels)   Toileting- Clothing Manipulation and Hygiene: Contact guard assist;Sit to/from stand       Functional mobility during ADLs: Supervision/safety;Rolling walker (2 wheels) (ambulation with RW) General ADL Comments: Pt with resting HR around 100 BPM in bed increasing with activity.  HR in the mid 120s while standing at the sink during grooming tasks but increased up to 145 BPM with transfer to toilet and for toileting tasks sit to stand.  Provided education on need for use of a shower seat in his walk-in shower at home  and 3:1 if needed for sit to stand over the toilet.  Pt reports that his spouse has these items from a past knee surgery and he can utilize them as needed.      Cognition Arousal: Alert Behavior During Therapy: WFL for tasks assessed/performed Overall Cognitive Status: Within Functional Limits for tasks assessed                                                      Pertinent Vitals/ Pain       Pain Assessment Pain Assessment: No/denies pain         Frequency  Min 1X/week        Progress Toward Goals  OT Goals(current goals can now be found in the care plan section)  Progress towards OT goals: Goals met/education completed, patient discharged from OT  Acute Rehab OT Goals Patient Stated Goal: Pt did not state but agreable to participation in OT         AM-PAC OT 6 Clicks Daily Activity     Outcome Measure   Help from another person eating meals?: None Help from another person taking care of personal grooming?: A Little Help from another person toileting, which includes using toliet, bedpan, or urinal?: A Little Help from another person bathing (including washing, rinsing, drying)?: A Little Help from another person to put on and taking off regular upper body clothing?: A Little Help from another person to put on and taking off regular lower body clothing?: A Little 6 Click Score: 19    End of Session Equipment Utilized During Treatment: Gait belt;Rolling walker (2 wheels)  OT Visit Diagnosis: Unsteadiness on feet (R26.81);Pain;Other abnormalities of gait and mobility (R26.89);Muscle weakness (generalized) (M62.81)   Activity Tolerance Patient tolerated treatment well   Patient Left in bed;with call bell/phone within reach;with family/visitor present   Nurse Communication Mobility status;Other (comment) (elevated HR)        Time: 9049-8971 OT Time Calculation (min): 38 min  Charges: OT General Charges $OT Visit: 1 Visit OT Evaluation $OT Eval Moderate Complexity: 1 Mod OT Treatments $Self Care/Home Management : 38-52 mins  Lynwood Constant, OTR/L Acute Rehabilitation Services  Office [phone] 02/15/2023

## 2023-02-15 NOTE — Progress Notes (Signed)
Cecee from lab . Removed tape from patient right hand. And It causes a skin tear. Foam applied.

## 2023-02-15 NOTE — Progress Notes (Signed)
 PHARMACY - ANTICOAGULATION CONSULT NOTE  Pharmacy Consult for Heparin  (Apixaban  on hold) Indication: atrial fibrillation  No Known Allergies  Patient Measurements: Height: 5' 8 (172.7 cm) Weight: 66.8 kg (147 lb 4.3 oz) IBW/kg (Calculated) : 68.4 Heparin  Dosing Weight: 66.8 kg  Vital Signs: Temp: 97.6 F (36.4 C) (02/04 0746) Temp Source: Oral (02/04 0746) BP: 138/82 (02/04 0746) Pulse Rate: 98 (02/04 0746)  Labs: Recent Labs    02/13/23 0518 02/13/23 1245 02/14/23 0459 02/15/23 0414  HGB 10.6*  --  10.8* 11.4*  HCT 32.5*  --  32.3* 34.9*  PLT PLATELET CLUMPS NOTED ON SMEAR, UNABLE TO ESTIMATE  --  157 194  APTT 71* 81* 77* 60*  HEPARINUNFRC 1.10*  --  0.94* 0.66  CREATININE 0.96  --  1.04 0.97   Assessment: 83 y.o male admitted on 02/08/23 for sepsis secondary to left lower extremity cellulitis, chronic left lower extremity wound with cellulitis. He has a history of chronic Afib and is on Apixaban  prior to admission which was continued on this admission.  Vascular surgery consulted for lower extremity PAD, nonhealing left foot dorsal aspect ulcer for the last 2 months  and plans to do angiogram procedure on Monday 02/14/23.  Eliquis  transitioned to IV heparin  on 1/31 (LD 1/30 2130) in anticipation of angiogram LLE.    -Heparin  restarted 4h post procedure, now consulted to transition back to Eliquis .   Goal of Therapy:  Monitor platelets by anticoagulation protocol: Yes   Plan:  STOP heparin  infusion when Eliquis  dose is given START Eliquis  5 mg twice daily   Maurilio Fila, PharmD Clinical Pharmacist 02/15/2023  8:47 AM

## 2023-02-15 NOTE — TOC Transition Note (Signed)
 Transition of Care [facility]) - Discharge Note Rayfield Gobble RN, BSN Transitions of Care Unit 4E- RN Case Manager See Treatment Team for direct phone #   Patient Details  Name: Donald Rios MRN: [id number] Date of Birth: 18-May-1940  Transition of Care [facility]) CM/SW Contact:  Gobble Rayfield Hurst, RN Phone Number: 02/15/2023, 1:05 PM   Clinical Narrative:    Pt stable for transition home today, orders placed for HHPT/OT.   CM in to speak with pt at bedside, discussed recommendations for [facility]- pt agreeable to services. List provided for [facility] choice Per CMS guidelines from [email or web address] website with star ratings (copy placed in shadow chart)- per pt he voiced he does not have a preference for provider.   Pt states he has needed DME at home- no new needs at this time.  Wife coming to transport home.   Address, phone # and PCP all confirmed  Correct Cell is [phone]  Call made to [facility] with [facility] for [facility] referral- referral has been accepted for HHPTOT   Final next level of care: Home w Home Health Services Barriers to Discharge: No Barriers Identified   Patient Goals and CMS Choice Patient states their goals for this hospitalization and ongoing recovery are:: return home CMS Medicare.gov Compare Post Acute Care list provided to:: Patient Choice offered to / list presented to : Patient      Discharge Placement               Home w/ [facility]        Discharge Plan and Services Additional resources added to the After Visit Summary for     Discharge Planning Services: CM Consult Post Acute Care Choice: Home Health          DME Arranged: N/A DME Agency: NA       HH Arranged: OT, PT HH Agency: [facility] Date [facility] Agency Contacted: 02/15/23 Time HH Agency Contacted: 1115 Representative spoke with at [facility] Agency: Darleene  Social Drivers of Health (SDOH) Interventions SDOH Screenings   Food Insecurity: No Food Insecurity (02/09/2023)  Housing: Low Risk   (02/09/2023)  Transportation Needs: No Transportation Needs (02/09/2023)  Utilities: Not At Risk (02/09/2023)  Alcohol  Screen: Low Risk  (07/22/2022)  Depression (PHQ2-9): Low Risk  (01/07/2023)  Financial Resource Strain: Low Risk  (08/24/2022)  Physical Activity: Sufficiently Active (07/10/2021)  Social Connections: Moderately Integrated (02/09/2023)  Stress: No Stress Concern Present (08/24/2022)  Tobacco Use: Medium Risk (02/09/2023)  Health Literacy: Adequate Health Literacy (08/24/2022)     Readmission Risk Interventions     No data to display

## 2023-02-15 NOTE — Final Progress Note (Signed)
 Discharge instructions (including medications) discussed with and copy provided to patient/caregiver

## 2023-02-16 ENCOUNTER — Telehealth: Payer: Self-pay | Admitting: *Deleted

## 2023-02-16 NOTE — Transitions of Care (Post Inpatient/ED Visit) (Signed)
 02/16/2023  Name: Donald Rios MRN: [id number] DOB: 05-Feb-1940  Today's TOC FU Call Status: Today's TOC FU Call Status:: Successful TOC FU Call Completed TOC FU Call Complete Date: 02/16/23 Patient's Name and Date of Birth confirmed.  Transition Care Management Follow-up Telephone Call Date of Discharge: 02/15/23 Discharge Facility: [facility]) Type of Discharge: Inpatient Admission Primary Inpatient Discharge Diagnosis:: Sepsis due to cellulitis How have you been since you were released from the hospital?:  (pt states eating well, drinking well, ambulating okay, feels  so much better) Any questions or concerns?: No  Items Reviewed: Did you receive and understand the discharge instructions provided?: Yes Medications obtained,verified, and reconciled?: Yes (Medications Reviewed) Any new allergies since your discharge?: No Dietary orders reviewed?: Yes Type of Diet Ordered:: heart healthy,  carb modified Do you have support at home?: Yes People in Home: spouse Name of Support/Comfort Primary Source: Donald [facility]ios Patient declines enrollment in [facility] 30 day program citing he has a lot of appointments and home health and is very busy Reviewed signs/ symptoms of infection, reportable signs/ symptoms Patient states stent was placed in his left leg and  I feel so much better since they did this Patient will continue with wound care at Rehab in [place] Reviewed specific instructions to take ASA, Plavis & Eliquis  x 3 months, then stop Plavix  only, pt verbalizes understanding  Medications Reviewed Today: Medications Reviewed Today     Reviewed by Donald Mliss LABOR, RN (Registered Nurse) on 02/16/23 at 1002  Med List Status: <None>   Medication Order Taking? Sig Documenting Provider Last Dose Status Informant  allopurinol  (ZYLOPRIM ) 300 MG tablet [id number] Yes TAKE 1 TABLET BY MOUTH DAILY  Patient taking differently: Take 300 mg by mouth at bedtime.   Donald Butler DASEN, MD  Taking Active Self, Pharmacy Records  amiodarone  (PACERONE ) 200 MG tablet [id number] Yes Take 1 tablet by mouth twice daily for 14 days then decrease to 1 tablet once daily. Donald Vinie BROCKS, MD Taking Active Self, Pharmacy Records  apixaban  (ELIQUIS ) 5 MG TABS tablet [id number] Yes Take 1 tablet (5 mg total) by mouth 2 (two) times daily. Donald Butler DASEN, MD Taking Active Self, Pharmacy Records  aspirin  EC 81 MG tablet [id number] Yes Take 1 tablet (81 mg total) by mouth daily. Swallow whole. Donald Ranks, MD Taking Active   clopidogrel  (PLAVIX ) 75 MG tablet [id number] Yes Take 1 tablet (75 mg total) by mouth daily. Donald Ranks, MD Taking Active   doxazosin  (CARDURA ) 2 MG tablet [id number] Yes TAKE 1 TABLET BY MOUTH DAILY  Patient taking differently: Take 2 mg by mouth at bedtime.   Donald Butler DASEN, MD Taking Active Self, Pharmacy Records  doxycycline  (VIBRA -TABS) 100 MG tablet [id number] Yes Take 1 tablet (100 mg total) by mouth 2 (two) times daily for 7 days. Donald Ranks, MD Taking Active   empagliflozin  (JARDIANCE ) 10 MG TABS tablet [id number] Yes Take 1 tablet (10 mg total) by mouth daily. Donald Manuelita NOVAK, NP Taking Active Self, Pharmacy Records  furosemide  (LASIX ) 40 MG tablet [id number] Yes Take 1 tablet (40 mg total) by mouth 2 (two) times daily. Donald Butler DASEN, MD Taking Active Self, Pharmacy Records  glipiZIDE  (GLIPIZIDE  XL) 5 MG 24 hr tablet [id number] Yes Take 1 tablet (5 mg total) by mouth daily with breakfast. Donald Butler DASEN, MD Taking Active Self, Pharmacy Records  insulin  aspart (FIASP  FLEXTOUCH) 100 UNIT/ML FlexTouch Pen [id number] Yes Before each meal 3 times a day, 140-199 - 2 units, 200-250 -  4 units, 251-299 - 6 units,  300-349 - 8 units,  350 or above 10 units.  Patient taking differently: Inject 3-7 Units into the skin as needed. Before each meal 3 times a day, 140-199 - 2 units, 200-250 - 4 units, 251-299 - 6 units,  300-349 - 8 units,  350 or above 10 units.   Donald Lavada POUR, MD Taking Active Self, Pharmacy Records  metoprolol  tartrate (LOPRESSOR ) 50 MG tablet [id number] Yes Take 1 tablet (50 mg total) by mouth 2 (two) times daily. Donald Ranks, MD Taking Active   mirtazapine  (REMERON ) 30 MG tablet [id number] Yes TAKE 1 TABLET BY MOUTH EVERY NIGHT AT BEDTIME FOR APPETITE Donald Butler DASEN, MD Taking Active Self, Pharmacy Records  pantoprazole  (PROTONIX ) 40 MG tablet [id number] Yes Take 1 tablet (40 mg total) by mouth daily. Donald Butler DASEN, MD Taking Active Self, Pharmacy Records  predniSONE  (DELTASONE ) 5 MG tablet [id number] Yes Take 2.5 mg by mouth daily with breakfast. [provider] Taking Active Self, Pharmacy Records  rosuvastatin  (CRESTOR ) 40 MG tablet [id number] Yes Take 1 tablet (40 mg total) by mouth daily. Donald Ranks, MD Taking Active   silver sulfADIAZINE (SILVADENE) 1 % cream [id number] Yes Apply 1 Application topically daily. [provider] Taking Active Self, Pharmacy Records           Med Note (Donald Rios   Tue Feb 08, 2023 10:02 PM) Apply to buttocks  spironolactone  (ALDACTONE ) 25 MG tablet [id number] Yes Take 1 tablet (25 mg total) by mouth 2 (two) times daily. Donald Butler DASEN, MD Taking Active Self, Pharmacy Records            Home Care and Equipment/Supplies: Were Home Health Services Ordered?: Yes Name of Home Health Agency:: [facility] Has Agency set up a time to come to your home?: No EMR reviewed for Home Health Orders: Orders present/patient has not received call (refer to CM for follow-up) (t/c to Donald Rios at [facility] who reports they have orders, in process of verifying insurance and will contact pt to schedule home visit for tentative 02/18/23, pt states spouse has HH number and knows to contact if needed) Any new equipment or medical supplies ordered?: No  Functional Questionnaire: Do you need assistance with bathing/showering or dressing?: No Do you need assistance with meal preparation?: Yes (spouse  assists) Do you need assistance with eating?: No Do you have difficulty maintaining continence: No Do you need assistance with getting out of bed/getting out of a chair/moving?: No Do you have difficulty managing or taking your medications?: Yes (spouse provides oversight)  Follow up appointments reviewed: PCP Follow-up appointment confirmed?: No (RN care manager offered to make appt, pt states spouse has to make the appt to fit their schedule and she will call today) MD Provider Line [phone] Given: No Specialist Hospital Follow-up appointment confirmed?: Yes Date of Specialist follow-up appointment?: 02/18/23 Follow-Up Specialty Provider:: [facility] [place]  @ 1 pm  (wound care)     pt states spouse will f/u on appt for Dr. Harden to be seen in one week- pt thinks appt has already been made Do you need transportation to your follow-up appointment?: No Do you understand care options if your condition(s) worsen?: Yes-patient verbalized understanding  SDOH Interventions Today    Flowsheet Row Most Recent Value  SDOH Interventions   Food Insecurity Interventions Intervention Not Indicated  Housing Interventions Intervention Not Indicated  Transportation Interventions Intervention Not Indicated  Utilities Interventions Intervention Not Indicated  Mliss Creed [facility], BSN RN Care Manager/ Transition of Care [place]/ [facility] [phone]

## 2023-02-18 ENCOUNTER — Ambulatory Visit (HOSPITAL_COMMUNITY): Payer: Medicare Other | Attending: Family Medicine | Admitting: Physical Therapy

## 2023-02-18 DIAGNOSIS — L02619 Cutaneous abscess of unspecified foot: Secondary | ICD-10-CM | POA: Insufficient documentation

## 2023-02-18 DIAGNOSIS — M316 Other giant cell arteritis: Secondary | ICD-10-CM | POA: Insufficient documentation

## 2023-02-18 DIAGNOSIS — S91302S Unspecified open wound, left foot, sequela: Secondary | ICD-10-CM | POA: Insufficient documentation

## 2023-02-18 DIAGNOSIS — Z79899 Other long term (current) drug therapy: Secondary | ICD-10-CM | POA: Insufficient documentation

## 2023-02-18 DIAGNOSIS — L03119 Cellulitis of unspecified part of limb: Secondary | ICD-10-CM | POA: Insufficient documentation

## 2023-02-18 DIAGNOSIS — I96 Gangrene, not elsewhere classified: Secondary | ICD-10-CM | POA: Insufficient documentation

## 2023-02-18 NOTE — Therapy (Signed)
 02/18/23  Pt arrived at department.  Therapist was reviewing pt chart and noted that he went to the ER, was admitted and had an angiogram performed to his leg.  There is no order to continue wound therapy from the vascular MD.  Therapist explained to patient that now that there has been an intervention surgically we need an order from his surgeon to continue therapy.  He is to see his surgeon on Monday.  Therapist recommended keeping appointments in case vascular MD wants therapy to continue with debridement and dressing change.  If MD does not feel this is needed he can cancel.  Pt states that he has not had a dressing on his foot since he left the hospital as nobody put on on him.  Therapist recommended that pt covers the wound immediately upon returning home.    Montie Metro, PT CLT [phone]

## 2023-02-21 ENCOUNTER — Ambulatory Visit (INDEPENDENT_AMBULATORY_CARE_PROVIDER_SITE_OTHER): Payer: Medicare Other | Admitting: Orthopedic Surgery

## 2023-02-21 ENCOUNTER — Telehealth: Payer: Self-pay

## 2023-02-21 DIAGNOSIS — L97522 Non-pressure chronic ulcer of other part of left foot with fat layer exposed: Secondary | ICD-10-CM

## 2023-02-21 NOTE — Telephone Encounter (Signed)
 Copied from CRM [id number]. Topic: Clinical - Home Health Verbal Orders >> Feb 21, 2023  2:58 PM Zipporah Him wrote: Caller/Agency: [facility] Callback Number: [phone] Service Requested: Physical Therapy Frequency: N/A Any new concerns about the patient? No Calling in to let office know that the patient is starting this PT late, he has appointments through Wednesday this week and will have his PT on Thursday. Please call if you have any questions about this.

## 2023-02-22 ENCOUNTER — Telehealth (HOSPITAL_COMMUNITY): Payer: Self-pay

## 2023-02-22 ENCOUNTER — Ambulatory Visit (HOSPITAL_COMMUNITY): Payer: Medicare Other

## 2023-02-22 NOTE — Telephone Encounter (Signed)
Called pt to see if received order to continue wound care therapy.  Pt stated he saw Dr Lajoyce Corners yesterday and per patient no longer needs to receive.  Pt upset that he did not receive care last time at wound center, educated need for referral to complete wound care.  DC'd per no referral to treat.    Becky Sax, LPTA/CLT; CBIS [phone]  Juel Burrow, PTA 02/22/2023, 12:12 PM

## 2023-02-24 DIAGNOSIS — H9190 Unspecified hearing loss, unspecified ear: Secondary | ICD-10-CM | POA: Diagnosis not present

## 2023-02-24 DIAGNOSIS — I70262 Atherosclerosis of native arteries of extremities with gangrene, left leg: Secondary | ICD-10-CM | POA: Diagnosis not present

## 2023-02-24 DIAGNOSIS — K2100 Gastro-esophageal reflux disease with esophagitis, without bleeding: Secondary | ICD-10-CM | POA: Diagnosis not present

## 2023-02-24 DIAGNOSIS — D631 Anemia in chronic kidney disease: Secondary | ICD-10-CM | POA: Diagnosis not present

## 2023-02-24 DIAGNOSIS — E1122 Type 2 diabetes mellitus with diabetic chronic kidney disease: Secondary | ICD-10-CM | POA: Diagnosis not present

## 2023-02-24 DIAGNOSIS — I5022 Chronic systolic (congestive) heart failure: Secondary | ICD-10-CM | POA: Diagnosis not present

## 2023-02-24 DIAGNOSIS — I872 Venous insufficiency (chronic) (peripheral): Secondary | ICD-10-CM | POA: Diagnosis not present

## 2023-02-24 DIAGNOSIS — I252 Old myocardial infarction: Secondary | ICD-10-CM | POA: Diagnosis not present

## 2023-02-24 DIAGNOSIS — H5462 Unqualified visual loss, left eye, normal vision right eye: Secondary | ICD-10-CM | POA: Diagnosis not present

## 2023-02-24 DIAGNOSIS — K5790 Diverticulosis of intestine, part unspecified, without perforation or abscess without bleeding: Secondary | ICD-10-CM | POA: Diagnosis not present

## 2023-02-24 DIAGNOSIS — L97521 Non-pressure chronic ulcer of other part of left foot limited to breakdown of skin: Secondary | ICD-10-CM | POA: Diagnosis not present

## 2023-02-24 DIAGNOSIS — E785 Hyperlipidemia, unspecified: Secondary | ICD-10-CM | POA: Diagnosis not present

## 2023-02-24 DIAGNOSIS — I13 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, or unspecified chronic kidney disease: Secondary | ICD-10-CM | POA: Diagnosis not present

## 2023-02-24 DIAGNOSIS — E1152 Type 2 diabetes mellitus with diabetic peripheral angiopathy with gangrene: Secondary | ICD-10-CM | POA: Diagnosis not present

## 2023-02-24 DIAGNOSIS — I482 Chronic atrial fibrillation, unspecified: Secondary | ICD-10-CM | POA: Diagnosis not present

## 2023-02-24 DIAGNOSIS — J4489 Other specified chronic obstructive pulmonary disease: Secondary | ICD-10-CM | POA: Diagnosis not present

## 2023-02-24 DIAGNOSIS — Z48812 Encounter for surgical aftercare following surgery on the circulatory system: Secondary | ICD-10-CM | POA: Diagnosis not present

## 2023-02-24 DIAGNOSIS — N182 Chronic kidney disease, stage 2 (mild): Secondary | ICD-10-CM | POA: Diagnosis not present

## 2023-02-24 DIAGNOSIS — I959 Hypotension, unspecified: Secondary | ICD-10-CM | POA: Diagnosis not present

## 2023-02-24 DIAGNOSIS — L03116 Cellulitis of left lower limb: Secondary | ICD-10-CM | POA: Diagnosis not present

## 2023-02-24 DIAGNOSIS — I255 Ischemic cardiomyopathy: Secondary | ICD-10-CM | POA: Diagnosis not present

## 2023-02-24 DIAGNOSIS — L02612 Cutaneous abscess of left foot: Secondary | ICD-10-CM | POA: Diagnosis not present

## 2023-02-24 DIAGNOSIS — M109 Gout, unspecified: Secondary | ICD-10-CM | POA: Diagnosis not present

## 2023-02-24 DIAGNOSIS — M1990 Unspecified osteoarthritis, unspecified site: Secondary | ICD-10-CM | POA: Diagnosis not present

## 2023-02-24 DIAGNOSIS — I2510 Atherosclerotic heart disease of native coronary artery without angina pectoris: Secondary | ICD-10-CM | POA: Diagnosis not present

## 2023-02-25 ENCOUNTER — Other Ambulatory Visit: Payer: Self-pay

## 2023-02-25 ENCOUNTER — Ambulatory Visit (HOSPITAL_BASED_OUTPATIENT_CLINIC_OR_DEPARTMENT_OTHER)
Admission: RE | Admit: 2023-02-25 | Discharge: 2023-02-25 | Disposition: A | Discharge status: Home / Self Care | Payer: Medicare Other | Source: Ambulatory Visit | Attending: Vascular Surgery | Admitting: Vascular Surgery

## 2023-02-25 ENCOUNTER — Other Ambulatory Visit: Payer: Self-pay | Admitting: Cardiology

## 2023-02-25 ENCOUNTER — Telehealth: Payer: Self-pay

## 2023-02-25 ENCOUNTER — Ambulatory Visit: Payer: Medicare Other | Admitting: Physician Assistant

## 2023-02-25 ENCOUNTER — Encounter: Payer: Self-pay | Admitting: Orthopedic Surgery

## 2023-02-25 ENCOUNTER — Ambulatory Visit (HOSPITAL_COMMUNITY): Payer: Medicare Other

## 2023-02-25 ENCOUNTER — Other Ambulatory Visit: Payer: Self-pay | Admitting: Physician Assistant

## 2023-02-25 ENCOUNTER — Ambulatory Visit (HOSPITAL_COMMUNITY)
Admission: RE | Admit: 2023-02-25 | Discharge: 2023-02-25 | Disposition: A | Discharge status: Home / Self Care | Payer: Medicare Other | Source: Ambulatory Visit | Attending: Vascular Surgery | Admitting: Vascular Surgery

## 2023-02-25 ENCOUNTER — Encounter (HOSPITAL_COMMUNITY): Payer: Self-pay

## 2023-02-25 ENCOUNTER — Ambulatory Visit: Payer: Self-pay | Admitting: Family Medicine

## 2023-02-25 ENCOUNTER — Ambulatory Visit (HOSPITAL_COMMUNITY): Admission: AD | Admit: 2023-02-25 | Payer: Medicare Other | Source: Ambulatory Visit | Admitting: Vascular Surgery

## 2023-02-25 VITALS — BP 94/45 | HR 87 | Temp 97.3°F | Ht 68.0 in | Wt 142.0 lb

## 2023-02-25 DIAGNOSIS — I729 Aneurysm of unspecified site: Secondary | ICD-10-CM | POA: Diagnosis not present

## 2023-02-25 DIAGNOSIS — I724 Aneurysm of artery of lower extremity: Secondary | ICD-10-CM

## 2023-02-25 DIAGNOSIS — T81718A Complication of other artery following a procedure, not elsewhere classified, initial encounter: Secondary | ICD-10-CM | POA: Diagnosis not present

## 2023-02-25 DIAGNOSIS — I739 Peripheral vascular disease, unspecified: Secondary | ICD-10-CM

## 2023-02-25 MED ORDER — THROMBIN FOR PERCUTANEOUS TREATMENT OF PSEUDOANEURYSM (5000UNITS/10ML)
5000.0000 [IU] | Freq: Once | PERCUTANEOUS | Status: AC
  Administered 2023-02-25: 5000 [IU] via PERCUTANEOUS
  Filled 2023-02-25: qty 1

## 2023-02-25 MED ORDER — LIDOCAINE HCL (PF) 1 % IJ SOLN
30.0000 mL | Freq: Once | INTRAMUSCULAR | Status: AC
  Administered 2023-02-25: 30 mL via INTRADERMAL
  Filled 2023-02-25: qty 30

## 2023-02-25 NOTE — Telephone Encounter (Signed)
  Chief Complaint: bleeding from incision site Symptoms: bleeding from right groin incision Frequency: x 1 hour Pertinent Negatives: Patient denies fever, redness, incision drainage, severe blood loss. Disposition: [x] ED /[] Urgent Care (no appt availability in office) / [] Appointment(In office/virtual)/ []  [facility]/ [] Home Care/ [] Refused Recommended Disposition /[]  Mobile Bus/ []  Follow-up with PCP Additional Notes: Patient had abdominal aortogram done on 02/14/23. Patient states he noticed bleeding about an hour ago in his shorts and immediately held pressure. He called the vascular surgeon first who told him to go to ED but he states he wanted to see if Dr Tanya Nones could see him for it instead. Patient states he has been holding pressure for about 30 minutes and that it isn't bleeding right away but slowly comes back. Advised patient continue to hold direct pressure with clean cloth or gauze pad and have someone drive him to ED.   Copied from CRM [id number]. Topic: Clinical - Red Word Triage >> Feb 25, 2023  9:36 AM Elle L wrote: Red Word that prompted transfer to Nurse Triage: The patient is bleeding from his groin where they went into an artery during a surgery. He states it started an hour ago and they had advised him to go to the emergency room but he wanted to see if Dr. Tanya Nones was available to see him. Reason for Disposition  [1] Bleeding from incision AND [2] won't stop after 10 minutes of direct pressure  Answer Assessment - Initial Assessment Questions 1. SYMPTOM: "What's the main symptom you're concerned about?" (e.g., drainage, incision opening up, pain, redness)     Bleeding from abdominal aortogram site in right groin.  2. ONSET: "When did bleeding start?"     About 1 hour ago. He states he is currently holding pressure.  3. SURGERY: "What surgery did you have?"      Abdominal aortogram.  4. DATE of SURGERY: "When was the surgery?"      02/14/23.  5.  INCISION SITE: "Where is the incision located?"      Right groin.  6. REDNESS: "Is there any redness at the incision site?" If Yes, ask: "How wide across is the redness?" (Inches, centimeters)      Bleeding, not redness.  7. PAIN: "Is there any pain?" If Yes, ask: "How bad is it?"  (Scale 1-10; or mild, moderate, severe)   - NONE (0): no pain   - MILD (1-3): doesn't interfere with normal activities    - MODERATE (4-7): interferes with normal activities or awakens from sleep    - SEVERE (8-10): excruciating pain, unable to do any normal activities     Denies.  8. BLEEDING: "Is there any bleeding?" If Yes, ask: "How much?" and "Where?"     At incision site to right groin. Not much blood, he states he saw it on his shorts and caught it pretty quick.  9. DRAINAGE: "Is there any drainage from the incision site?" If Yes, ask: "What color and how much?" (e.g., red, cloudy, pus; drops, teaspoon)     Denies.  10. FEVER: "Do you have a fever?" If Yes, ask: "What is your temperature, how was it measured, and when did it start?"       Denies.  11. OTHER SYMPTOMS: "Do you have any other symptoms?" (e.g., dizziness, rash elsewhere on body, shaking chills, weakness)       Denies.  Protocols used: Post-Op Incision Symptoms and Questions-A-AH

## 2023-02-25 NOTE — Progress Notes (Signed)
Groin pseudoaneurysm duplex completed. Please see CV Procedures for preliminary results.  Shona Simpson, RVT 02/25/23 3:42 PM

## 2023-02-25 NOTE — Progress Notes (Signed)
Arterial pseudoaneurysm injection duplex completed. Please see CV Procedures for preliminary results.  Shona Simpson, RVT 02/25/23 4:01 PM

## 2023-02-25 NOTE — Progress Notes (Addendum)
1153 - recheck of patient's BP - patient feeling lightheaded and states his vision is blurry   Dr Hetty Blend in room with patient  BP  58/43   Left arm    1157 - BP  79/53   Left arm      1215 - BP   94/45  Left arm   Patient states that his vision is better and he is not feeling as light headed

## 2023-02-25 NOTE — Discharge Instructions (Addendum)
No heavy lifting for 2 weeks Hold eliquis until follow up appointment on Tuesday. Removed bandage in 24 hours.

## 2023-02-25 NOTE — Progress Notes (Signed)
Office Visit Note   Patient: Donald Rios           Date of Birth: 14-Mar-1940           MRN: [id number] Visit Date: 02/21/2023              Requested by: [name], MD [address] [place],  [place] [address] PCP: [name], MD  Chief Complaint  Patient presents with   Left Foot - Follow-up      HPI: Patient is an 83 year old gentleman who is status post wound debridement dorsum of the left foot.  Patient is status post revascularization.  Assessment & Plan: Visit Diagnoses:  1. Ulcerated, foot, left, with fat layer exposed (HCC)     Plan: Recommended knee-high compression continue routine wound care.  Follow-Up Instructions: Return in about 2 weeks (around 03/07/2023).   Ortho Exam  Patient is alert, oriented, no adenopathy, well-dressed, normal affect, normal respiratory effort. Examination patient has a palpable dorsalis pedis pulse with excellent revascularization.  The dorsal foot wound has completely healed the right great toe eschar is healing well.  Calf measures 31 cm in circumference with venous insufficiency.  Imaging: No results found. No images are attached to the encounter.  Labs: Lab Results  Component Value Date   HGBA1C 6.2 (H) 02/08/2023   HGBA1C 5.8 (H) 08/14/2022   HGBA1C 5.6 05/14/2022   ESRSEDRATE 2 12/28/2022   ESRSEDRATE 6 11/09/2022   ESRSEDRATE 2 09/06/2022   CRP <0.5 02/14/2023   CRP <0.5 02/13/2023   CRP 0.5 02/12/2023   LABURIC 4.6 04/11/2012   REPTSTATUS 02/13/2023 FINAL 02/08/2023   CULT  02/08/2023    NO GROWTH 5 DAYS Performed at [facility] [place] [facility], [address], [place], [place] [address]      Lab Results  Component Value Date   ALBUMIN 2.5 (L) 02/10/2023   ALBUMIN 3.6 02/08/2023   ALBUMIN 3.8 12/02/2022    Lab Results  Component Value Date   MG 2.1 02/14/2023   MG 2.1 02/13/2023   MG 2.1 02/12/2023   No results found for: "VD25OH"  No results found for: "PREALBUMIN"    Latest Ref  Rng & Units 02/15/2023    4:14 AM 02/14/2023    4:59 AM 02/13/2023    5:18 AM  CBC EXTENDED  WBC 4.0 - 10.5 K/uL 17.6  17.5  14.4   RBC 4.22 - 5.81 MIL/uL 3.31  3.07  3.08   Hemoglobin 13.0 - 17.0 g/dL 84.1  32.4  40.1   HCT 39.0 - 52.0 % 34.9  32.3  32.5   Platelets 150 - 400 K/uL 194  157  PLATELET CLUMPS NOTED ON SMEAR, UNABLE TO ESTIMATE   NEUT# 1.7 - 7.7 K/uL 13.7  13.0  11.2   Lymph# 0.7 - 4.0 K/uL 2.4  2.6  1.8      There is no height or weight on file to calculate BMI.  Orders:  No orders of the defined types were placed in this encounter.  No orders of the defined types were placed in this encounter.    Procedures: No procedures performed  Clinical Data: No additional findings.  ROS:  All other systems negative, except as noted in the HPI. Review of Systems  Objective: Vital Signs: There were no vitals taken for this visit.  Specialty Comments:  No specialty comments available.  PMFS History: Patient Active Problem List   Diagnosis Date Noted   Gangrene of  toe of left foot (HCC) 02/10/2023   Cellulitis and abscess of foot 02/09/2023   Sepsis due to cellulitis (HCC) 02/08/2023   High risk medication use 10/25/2022   Screening for tuberculosis 10/25/2022   Atrial fibrillation, chronic (HCC) 08/15/2022   Cardiomyopathy (HCC) 08/15/2022   Temporal arteritis (HCC) 08/13/2022   Anemia 08/04/2022   Protein-calorie malnutrition (HCC) 11/21/2018   Hx of CABG 08/30/2017   Cardiomyopathy, ischemic 12/23/2015   Chronic systolic congestive heart failure, NYHA class 1 (HCC) 12/23/2015   Acute gastric ulcer with hemorrhage 05/06/2013   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    Mild tricuspid regurgitation    Mitral regurgitation    Atrial enlargement, left    Hearing loss    Diverticulosis    Hiatal hernia    Gout    DM2 (diabetes mellitus, type 2) (HCC)    GERD (gastroesophageal reflux disease)    Mixed hyperlipidemia    Essential hypertension    Past  Medical History:  Diagnosis Date   Acute gastric ulcer with hemorrhage 05/06/2013   Arthritis    Asthma    Atrial enlargement, left    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    History of MI (myocardial infarction)    History of nuclear stress test 08/07/2010   dipyridamole; EKG negative for ischemia, low risk scan    Hyperlipidemia    Hyperplastic colon polyp 05/2008   Hypertension    Ischemic cardiomyopathy    EF 45%, with inferior wall motion abnormality    Myocardial infarction (HCC)    Valvular regurgitation    mitral and tricuspid (mild)    Family History  Problem Relation Age of Onset   Stroke Father    Hypertension Father    Diabetes Brother    Esophageal cancer Brother    Stroke Maternal Grandmother    Stroke Maternal Grandfather    Stomach cancer Maternal Aunt    Colon cancer Neg Hx    Rectal cancer Neg Hx     Past Surgical History:  Procedure Laterality Date   ABDOMINAL AORTOGRAM W/LOWER EXTREMITY N/A 02/14/2023   Procedure: ABDOMINAL AORTOGRAM W/LOWER EXTREMITY;  Surgeon: Daria Pastures, MD;  Location: [facility] INVASIVE CV LAB;  Service: Cardiovascular;  Laterality: N/A;   CARDIOVERSION N/A 08/16/2022   Procedure: CARDIOVERSION;  Surgeon: Jake Bathe, MD;  Location: [facility] INVASIVE CV LAB;  Service: Cardiovascular;  Laterality: N/A;   CORONARY ARTERY BYPASS GRAFT  2000   LIMA to LAD, free IMA to OM2, sequential graft to PLA & PLD   ESOPHAGOGASTRODUODENOSCOPY N/A 05/07/2013   Procedure: ESOPHAGOGASTRODUODENOSCOPY (EGD);  Surgeon: Iva Boop, MD;  Location[facility] ENDOSCOPY;  Service: Endoscopy;  Laterality: N/A;   PERIPHERAL VASCULAR INTERVENTION Left 02/14/2023   Procedure:  PERIPHERAL VASCULAR INTERVENTION;  Surgeon: Daria Pastures, MD;  Location: [facility] INVASIVE CV LAB;  Service: Cardiovascular;  Laterality: Left;  SFA   TEE WITHOUT CARDIOVERSION N/A 08/16/2022   Procedure: TRANSESOPHAGEAL ECHOCARDIOGRAM;  Surgeon: Jake Bathe, MD;  Location: [facility] INVASIVE CV LAB;  Service: Cardiovascular;  Laterality: N/A;   TRANSTHORACIC ECHOCARDIOGRAM  09/07/2010   EF  45-50%; LV systolic function mildly reduced; LA mildly dilated; mild-mod MR & mild-mod TR; aortic root sclerosis/calcification;    Social History   Occupational History   Not on file  Tobacco Use   Smoking status: Former    Current packs/day: 0.00    Types: Cigarettes    Quit date: 11/04/1985    Years since quitting: 37.3   Smokeless tobacco: Never  Vaping Use   Vaping status: Never Used  Substance and Sexual Activity   Alcohol use: Not Currently    Alcohol/week: 2.0 - 3.0 standard drinks of alcohol    Types: 2 - 3 Cans of beer per week   Drug use: No   Sexual activity: Not on file

## 2023-02-25 NOTE — Op Note (Signed)
    OPERATIVE NOTE  PROCEDURE:   u/s guided thrombin injection of right femoral pseudoaneurysm   PRE-OPERATIVE DIAGNOSIS: R femoral artery pseudoaneurysm  POST-OPERATIVE DIAGNOSIS: same as above   SURGEON: Daria Pastures MD  ASSISTANT(S): none  ANESTHESIA: local  ESTIMATED BLOOD LOSS: minimal  FINDING(S): 3 cm right femoral artery pseudoaneurysm with long neck Adequate thrombosis of pseudoaneurysm post thrombin injection with triphasic right common femoral waveform  SPECIMEN(S): None  INDICATIONS:   Donald Rios is a 83 y.o. 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  He presented to clinic for urgent evaluation today due to right groin access site bleeding with focal swelling.  Bedside ultrasound was obtained which demonstrated a pseudoaneurysm approximately 2.5 cm's.  Given that he is on Eliquis, ultrasound-guided thrombin injection was offered, risks and benefits were reviewed, patient expressed understanding and elected to proceed.  DESCRIPTION: The patient was seen in short stay and identity was confirmed with 2 identifiers.  Once the preop duplex was complete I prepared the thrombin according to the thrombin injection kit with 5 cm's of saline.  Right groin was prepped and draped in the usual sterile fashion and sterile ultrasound was used to evaluate the appropriate area of access.  The area was anesthetized with lidocaine and the pseudoaneurysm was accessed under ultrasound guidance.  I then slowly injected approximately 0.5 cc of thrombin.  After about 30 seconds there was still some residual color flow in the pseudoaneurysm.  I then slowly draped in more thrombin for a total of about 0.8 cc of thrombin and at that time it appeared that the entire pseudoaneurysm and thrombosed.  Completion duplex demonstrated adequate thrombosis with minimal monophasic flow in the neck and triphasic flow in the native common  femoral. The patient tolerated the injection well and a sterile bandage was applied. Plan will be to have the patient lay flat for another 90 minutes and then will be discharged from short stay with short interval follow-up in the clinic.    COMPLICATIONS: None apparent  CONDITION: Stable  Daria Pastures MD [facility] Phone Number: [phone] 02/25/2023 3:02 PM

## 2023-02-25 NOTE — Progress Notes (Signed)
Office Note     CC:  follow up Requesting Provider:  [name], MD  HPI: Donald Rios is a 83 y.o. (1940-11-01) male who presents as triage work in due to right groin bleeding. He called our triage line this morning and was directed to come to office for evaluation. He recently underwent Aortogram, Arteriogram of LLE with left SFA stenting on 02/14/23 by Dr. Hetty Blend. This was performed due to non healing wound of left foot.   He explains he noticed bleeding this morning when he woke up on his briefs. He also felt a golf ball sized knot in the groin. He explains that he hadn't had the knot there before today that he noticed. He says it is not very painful. He has just had a slow bleed that he has had to wipe but denies any significant bleeding from the groin. He does report that the bruising has been present since he left the hospital and it is overall about the same. He denies any pain in his legs. He overall is feeling okay. He has been compliant with his Aspirin, Eliquis and statin.   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    History of MI (myocardial infarction)    History of nuclear stress test 08/07/2010   dipyridamole; EKG negative for ischemia, low risk scan    Hyperlipidemia    Hyperplastic colon polyp 05/2008   Hypertension    Ischemic cardiomyopathy    EF 45%, with inferior wall motion abnormality    Myocardial infarction (HCC)    Valvular regurgitation    mitral and tricuspid (mild)    Past Surgical History:  Procedure  Laterality Date   ABDOMINAL AORTOGRAM W/LOWER EXTREMITY N/A 02/14/2023   Procedure: ABDOMINAL AORTOGRAM W/LOWER EXTREMITY;  Surgeon: Daria Pastures, MD;  Location: [facility];  Service: Cardiovascular;  Laterality: N/A;   CARDIOVERSION N/A 08/16/2022   Procedure: CARDIOVERSION;  Surgeon: Jake Bathe, MD;  Location: [facility];  Service: Cardiovascular;  Laterality: N/A;   CORONARY ARTERY BYPASS GRAFT  2000   LIMA to LAD, free IMA to OM2, sequential graft to PLA & PLD   ESOPHAGOGASTRODUODENOSCOPY N/A 05/07/2013   Procedure: ESOPHAGOGASTRODUODENOSCOPY (EGD);  Surgeon: Iva Boop, MD;  Location: [facility] ENDOSCOPY;  Service: Endoscopy;  Laterality: N/A;   PERIPHERAL VASCULAR INTERVENTION Left 02/14/2023   Procedure: PERIPHERAL VASCULAR INTERVENTION;  Surgeon: Daria Pastures, MD;  Location: [facility] INVASIVE CV LAB;  Service: Cardiovascular;  Laterality: Left;  SFA   TEE WITHOUT CARDIOVERSION N/A 08/16/2022   Procedure: TRANSESOPHAGEAL ECHOCARDIOGRAM;  Surgeon: Jake Bathe, MD;  Location: [facility];  Service: Cardiovascular;  Laterality: N/A;   TRANSTHORACIC ECHOCARDIOGRAM  09/07/2010   EF 45-50%; LV systolic function mildly reduced; LA mildly dilated; mild-mod MR & mild-mod TR; aortic root sclerosis/calcification;     Social History   Socioeconomic History   Marital status: Married    Spouse name:  Not on file   Number of children: 1   Years of education: Not on file   Highest education level: Not on file  Occupational History   Not on file  Tobacco Use   Smoking status: Former    Current packs/day: 0.00    Types: Cigarettes    Quit date: 11/04/1985    Years since quitting: 37.3   Smokeless tobacco: Never  Vaping Use   Vaping status: Never Used  Substance and Sexual Activity   Alcohol use: Not Currently    Alcohol/week: 2.0 - 3.0 standard drinks of alcohol    Types: 2 - 3 Cans of beer per week   Drug use: No   Sexual activity: Not on file  Other Topics Concern    Not on file  Social History Narrative   Not on file   Social Drivers of Health   Financial Resource Strain: Low Risk  (08/24/2022)   Overall Financial Resource Strain (CARDIA)    Difficulty of Paying Living Expenses: Not hard at all  Food Insecurity: No Food Insecurity (02/16/2023)   Hunger Vital Sign    Worried About Running Out of Food in the Last Year: Never true    Ran Out of Food in the Last Year: Never true  Transportation Needs: No Transportation Needs (02/16/2023)   PRAPARE - Administrator, Civil Service (Medical): No    Lack of Transportation (Non-Medical): No  Physical Activity: Sufficiently Active (07/10/2021)   Exercise Vital Sign    Days of Exercise per Week: 5 days    Minutes of Exercise per Session: 30 min  Stress: No Stress Concern Present (08/24/2022)   [employer] of Occupational Health - Occupational Stress Questionnaire    Feeling of Stress : Only a little  Social Connections: Moderately Integrated (02/09/2023)   Social Connection and Isolation Panel [NHANES]    Frequency of Communication with Friends and Family: Twice a week    Frequency of Social Gatherings with Friends and Family: Twice a week    Attends Religious Services: More than 4 times per year    Active Member of [employer] or Organizations: No    Attends Banker Meetings: Never    Marital Status: Married  Catering manager Violence: Not At Risk (02/16/2023)   Humiliation, Afraid, Rape, and Kick questionnaire    Fear of Current or Ex-Partner: No    Emotionally Abused: No    Physically Abused: No    Sexually Abused: No    Family History  Problem Relation Age of Onset   Stroke Father    Hypertension Father    Diabetes Brother    Esophageal cancer Brother    Stroke Maternal Grandmother    Stroke Maternal Grandfather    Stomach cancer Maternal Aunt    Colon cancer Neg Hx    Rectal cancer Neg Hx     Current Outpatient Medications  Medication Sig Dispense Refill    allopurinol (ZYLOPRIM) 300 MG tablet TAKE 1 TABLET BY MOUTH DAILY (Patient taking differently: Take 300 mg by mouth at bedtime.) 90 tablet 0   amiodarone (PACERONE) 200 MG tablet Take 1 tablet by mouth twice daily for 14 days then decrease to 1 tablet once daily. 115 tablet 1   apixaban (ELIQUIS) 5 MG TABS tablet Take 1 tablet (5 mg total) by mouth 2 (two) times daily. 60 tablet 5   aspirin EC 81 MG tablet Take 1 tablet (81 mg total) by mouth daily. Swallow whole. 30 tablet 0  clopidogrel (PLAVIX) 75 MG tablet Take 1 tablet (75 mg total) by mouth daily. 30 tablet 0   doxazosin (CARDURA) 2 MG tablet TAKE 1 TABLET BY MOUTH DAILY (Patient taking differently: Take 2 mg by mouth at bedtime.) 90 tablet 3   empagliflozin (JARDIANCE) 10 MG TABS tablet Take 1 tablet (10 mg total) by mouth daily. 30 tablet 2   furosemide (LASIX) 40 MG tablet Take 1 tablet (40 mg total) by mouth 2 (two) times daily. 180 tablet 3   glipiZIDE (GLIPIZIDE XL) 5 MG 24 hr tablet Take 1 tablet (5 mg total) by mouth daily with breakfast. 30 tablet 3   insulin aspart (FIASP FLEXTOUCH) 100 UNIT/ML FlexTouch Pen Before each meal 3 times a day, 140-199 - 2 units, 200-250 - 4 units, 251-299 - 6 units,  300-349 - 8 units,  350 or above 10 units. (Patient taking differently: Inject 3-7 Units into the skin as needed. Before each meal 3 times a day, 140-199 - 2 units, 200-250 - 4 units, 251-299 - 6 units,  300-349 - 8 units,  350 or above 10 units.) 9 mL 0   metoprolol tartrate (LOPRESSOR) 50 MG tablet Take 1 tablet (50 mg total) by mouth 2 (two) times daily. 60 tablet 0   mirtazapine (REMERON) 30 MG tablet TAKE 1 TABLET BY MOUTH EVERY NIGHT AT BEDTIME FOR APPETITE 90 tablet 1   pantoprazole (PROTONIX) 40 MG tablet Take 1 tablet (40 mg total) by mouth daily. 30 tablet 3   predniSONE (DELTASONE) 5 MG tablet Take 2.5 mg by mouth daily with breakfast.     rosuvastatin (CRESTOR) 40 MG tablet Take 1 tablet (40 mg total) by mouth daily. 90 tablet 3    silver sulfADIAZINE (SILVADENE) 1 % cream Apply 1 Application topically daily.     spironolactone (ALDACTONE) 25 MG tablet Take 1 tablet (25 mg total) by mouth 2 (two) times daily. 180 tablet 3   No current facility-administered medications for this visit.    No Known Allergies   REVIEW OF SYSTEMS:  [X]  denotes positive finding, [ ]  denotes negative finding Cardiac  Comments:  Chest pain or chest pressure:    Shortness of breath upon exertion:    Short of breath when lying flat:    Irregular heart rhythm:        Vascular    Pain in calf, thigh, or hip brought on by ambulation:    Pain in feet at night that wakes you up from your sleep:     Blood clot in your veins:    Leg swelling:         Pulmonary    Oxygen at home:    Productive cough:     Wheezing:         Neurologic    Sudden weakness in arms or legs:     Sudden numbness in arms or legs:     Sudden onset of difficulty speaking or slurred speech:    Temporary loss of vision in one eye:     Problems with dizziness:         Gastrointestinal    Blood in stool:     Vomited blood:         Genitourinary    Burning when urinating:     Blood in urine:        Psychiatric    Major depression:         Hematologic    Bleeding problems:    Problems with blood  clotting too easily:        Skin    Rashes or ulcers:        Constitutional    Fever or chills:      PHYSICAL EXAMINATION:  Vitals:   02/25/23 1119  BP: (!) 88/51  Pulse: 87  Temp: (!) 97.3 F (36.3 C)  SpO2: 99%  Weight: 142 lb (64.4 kg)  Height: 5\' 8"  (1.727 m)    General:  WDWN in NAD; vital signs documented above Gait: Normal HENT: WNL, normocephalic Pulmonary: normal non-labored breathing without wheezing Cardiac: regular HR Abdomen: soft Vascular Exam/Pulses: 2+ femoral pulses, extensive ecchymosis present in right groin, small hematoma present with palpable golf ball sized knot, some pulsatility felt. Small amount of bleeding from  pinpoint access site Extremities: without ischemic changes, without Gangrene , without cellulitis Musculoskeletal: no muscle wasting or atrophy  Neurologic: A&O X 3 Psychiatric:  The pt has Normal affect.   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 male here for follow up for as triage work in due to right groin bleeding. He called our triage line this morning and was directed to come to office for evaluation. He recently underwent Aortogram, Arteriogram of LLE with left SFA stenting on 02/14/23 by Dr. Hetty Blend. This was performed due to non healing wound of left foot. 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  - Patient seen with Dr. Hetty Blend. He performed POC ultrasound and visualized small pseudoaneurysm with flow in neck. He used ultrasound probe to apply manual pressure for 10 minutes. After pressure applied there was flow still visualized into the pseudoaneurysm. Patient also experienced some dizziness and visual changes likely from vagal response. Had some hypotension as well. Discussed with patient and his wife recommendation for direct admission to hospital and overnight observation. If he is able to get bed this afternoon may attempt thrombin injection of the pseudoaneurysm otherwise this will be performed tomorrow. Dr. Hetty Blend also discussed that there is potential that he may need surgical intervention if injection is not successful. Patient and wife are agreeable to proceed to [facility] - Patient will continue to hold his Eliquis - He did have coffee and waffle this morning. Will keep him NPO for now  Graceann Congress, PA-C [facility] [phone]  Clinic MD:  Hetty Blend

## 2023-02-25 NOTE — Telephone Encounter (Addendum)
Triage Call: -received message from pt's spouse stating pt is bleeding from his groin  -reviewed chart - s/p LSFA stenting on 2/3 by Dr. Hetty Blend -consulted with PA and advised pt to proceed to the ED -pt notified and pt states he does not want to go because of the wait and his wife has a doctors appt at 60 and he has 2 people coming to his house about doing some work, that maybe he can go this afternoon.   - urged pt of the importance of going to the ED for imaging and possible surgical intervention may be required, that it could worsen.  Pt asked if he could have internal bleeding which I explained it is possible and he is advised to report to the ED immediately.   -consulted with Dr. Hetty Blend who agreed to see pt in office.  Pt notified and stated he will come on in now.

## 2023-02-28 ENCOUNTER — Telehealth: Payer: Self-pay | Admitting: Internal Medicine

## 2023-02-28 ENCOUNTER — Ambulatory Visit: Payer: Medicare Other | Admitting: Internal Medicine

## 2023-02-28 ENCOUNTER — Ambulatory Visit: Payer: Self-pay | Admitting: Family Medicine

## 2023-02-28 ENCOUNTER — Other Ambulatory Visit: Payer: Self-pay | Admitting: Orthopedic Surgery

## 2023-02-28 ENCOUNTER — Other Ambulatory Visit: Payer: Self-pay | Admitting: Family Medicine

## 2023-02-28 ENCOUNTER — Telehealth: Payer: Self-pay | Admitting: Orthopedic Surgery

## 2023-02-28 ENCOUNTER — Telehealth: Payer: Self-pay

## 2023-02-28 DIAGNOSIS — Z79899 Other long term (current) drug therapy: Secondary | ICD-10-CM

## 2023-02-28 DIAGNOSIS — M7989 Other specified soft tissue disorders: Secondary | ICD-10-CM

## 2023-02-28 DIAGNOSIS — Z7952 Long term (current) use of systemic steroids: Secondary | ICD-10-CM

## 2023-02-28 DIAGNOSIS — M316 Other giant cell arteritis: Secondary | ICD-10-CM

## 2023-02-28 DIAGNOSIS — I872 Venous insufficiency (chronic) (peripheral): Secondary | ICD-10-CM | POA: Diagnosis not present

## 2023-02-28 DIAGNOSIS — L02612 Cutaneous abscess of left foot: Secondary | ICD-10-CM | POA: Diagnosis not present

## 2023-02-28 DIAGNOSIS — E1152 Type 2 diabetes mellitus with diabetic peripheral angiopathy with gangrene: Secondary | ICD-10-CM | POA: Diagnosis not present

## 2023-02-28 DIAGNOSIS — Z48812 Encounter for surgical aftercare following surgery on the circulatory system: Secondary | ICD-10-CM | POA: Diagnosis not present

## 2023-02-28 DIAGNOSIS — I5022 Chronic systolic (congestive) heart failure: Secondary | ICD-10-CM

## 2023-02-28 DIAGNOSIS — L97521 Non-pressure chronic ulcer of other part of left foot limited to breakdown of skin: Secondary | ICD-10-CM | POA: Diagnosis not present

## 2023-02-28 DIAGNOSIS — L03116 Cellulitis of left lower limb: Secondary | ICD-10-CM | POA: Diagnosis not present

## 2023-02-28 MED ORDER — HYDROCODONE-ACETAMINOPHEN 5-325 MG PO TABS: 1.0000 | ORAL_TABLET | Freq: Four times a day (QID) | ORAL | 0 refills | Status: DC | PRN

## 2023-02-28 NOTE — Telephone Encounter (Signed)
Called and spoke w/Stacy" verbal orders "ok" for Service Requested: Occupational Therapy Frequency: 1 time a week for 4 weeks  Pcp is aware

## 2023-02-28 NOTE — Telephone Encounter (Signed)
Patient's wife [name] called advised patient has a real bad pain in his left foot and need something called into his pharmacy for pain. [facility] [address]. The number to contact [name] is [phone]

## 2023-02-28 NOTE — Telephone Encounter (Signed)
   Chief Complaint: left foot pain  Symptoms: pain Frequency: constant   Disposition: [x] ED /[] Urgent Care (no appt availability in office) / [] Appointment(In office/virtual)/ []  [facility]/ [] Home Care/ [] Refused Recommended Disposition /[] [facility]/ []  Follow-up with PCP Additional Notes: Pt complaining of worsening left foot pain for 15 hours. Pt stated he has had this pain for 5-6 weeks, but the stent on 2/3 helped with circulation and healed wound. Pt had a stent leak on 2/14 (left leg) and was repaired. Pt currently rated pain 7-8/10. NO OTC medication is helping, per pt.Pt states the pain would go away once he quit walking, but now it is constant. Pt stated it is swollen and red. Pt denies any other symptoms. Per protocol, RN advised pt to go to ED. Pt is refusing due to appt today and seeing Cardiologist tomorrow. RN explained importance and pt said "I can't go sit there all day. I have appts." RN notified CAL.              Copied from CRM [id number]. Topic: Clinical - Red Word Triage >> Feb 28, 2023  8:38 AM Geroge Baseman wrote: Red Word that prompted transfer to Nurse Triage:  Left foot hurting extremely bad, cannot walk on it. Reason for Disposition  [1] Red area or streak AND [2] fever  Answer Assessment - Initial Assessment Questions 1. ONSET: "When did the pain start?"      5-6 weeks 2. LOCATION: "Where is the pain located?"      Left foot  3. PAIN: "How bad is the pain?"    (Scale 1-10; or mild, moderate, severe)  - MILD (1-3): doesn't interfere with normal activities.   - MODERATE (4-7): interferes with normal activities (e.g., work or school) or awakens from sleep, limping.   - SEVERE (8-10): excruciating pain, unable to do any normal activities, unable to walk.      7-8 4. WORK OR EXERCISE: "Has there been any recent work or exercise that involved this part of the body?"      no 5. CAUSE: "What do you think is causing the foot pain?"     Not  sure  6. OTHER SYMPTOMS: "Do you have any other symptoms?" (e.g., leg pain, rash, fever, numbness)     Numbness on bottom  Protocols used: Foot Pain-A-AH

## 2023-02-28 NOTE — Telephone Encounter (Signed)
Pt called requesting if he could get any pain medications that could help his foot. Pt states he can not walk.

## 2023-02-28 NOTE — Telephone Encounter (Signed)
I called and advised message below and pt stated that he had called Dr. Abelina Bachelor office as well and they responded with refill no need for our office to fill.

## 2023-02-28 NOTE — Telephone Encounter (Signed)
Pt is s/p revasc surgery and had been in the office for eval of foot wound. Pt had stated that he wanted to go to the wound center but the pt has cx multiple appts. She is on the sch 03/08/2023. Asking for rx for pain medication see message below and advise.

## 2023-02-28 NOTE — Telephone Encounter (Signed)
Copied from CRM [id number]. Topic: Clinical - Home Health Verbal Orders >> Feb 28, 2023  2:57 PM Carlatta H wrote: Caller/Agency: Joanne Chars Number: [phone] Service Requested: Occupational Therapy Frequency: 1 time a week for 4 weeks Any new concerns about the patient? No

## 2023-03-01 ENCOUNTER — Ambulatory Visit: Payer: Medicare Other

## 2023-03-01 ENCOUNTER — Ambulatory Visit (HOSPITAL_COMMUNITY): Payer: Medicare Other

## 2023-03-01 ENCOUNTER — Ambulatory Visit (HOSPITAL_COMMUNITY): Admission: RE | Admit: 2023-03-01 | Payer: Medicare Other | Source: Ambulatory Visit

## 2023-03-01 DIAGNOSIS — E1152 Type 2 diabetes mellitus with diabetic peripheral angiopathy with gangrene: Secondary | ICD-10-CM | POA: Diagnosis not present

## 2023-03-01 DIAGNOSIS — Z48812 Encounter for surgical aftercare following surgery on the circulatory system: Secondary | ICD-10-CM | POA: Diagnosis not present

## 2023-03-01 DIAGNOSIS — L02612 Cutaneous abscess of left foot: Secondary | ICD-10-CM | POA: Diagnosis not present

## 2023-03-01 DIAGNOSIS — L03116 Cellulitis of left lower limb: Secondary | ICD-10-CM | POA: Diagnosis not present

## 2023-03-01 DIAGNOSIS — I872 Venous insufficiency (chronic) (peripheral): Secondary | ICD-10-CM | POA: Diagnosis not present

## 2023-03-01 DIAGNOSIS — L97521 Non-pressure chronic ulcer of other part of left foot limited to breakdown of skin: Secondary | ICD-10-CM | POA: Diagnosis not present

## 2023-03-01 NOTE — Telephone Encounter (Signed)
Hydrocodone Rx sent in from Dr. Caren Macadam office for short term so nothing extra at this time. We will see hi next week to reassess.

## 2023-03-03 DIAGNOSIS — Z713 Dietary counseling and surveillance: Secondary | ICD-10-CM | POA: Diagnosis not present

## 2023-03-03 DIAGNOSIS — L89899 Pressure ulcer of other site, unspecified stage: Secondary | ICD-10-CM | POA: Diagnosis not present

## 2023-03-04 ENCOUNTER — Ambulatory Visit (HOSPITAL_COMMUNITY): Payer: Medicare Other

## 2023-03-04 DIAGNOSIS — Z48812 Encounter for surgical aftercare following surgery on the circulatory system: Secondary | ICD-10-CM | POA: Diagnosis not present

## 2023-03-04 DIAGNOSIS — L02612 Cutaneous abscess of left foot: Secondary | ICD-10-CM | POA: Diagnosis not present

## 2023-03-04 DIAGNOSIS — L03116 Cellulitis of left lower limb: Secondary | ICD-10-CM | POA: Diagnosis not present

## 2023-03-04 DIAGNOSIS — E1152 Type 2 diabetes mellitus with diabetic peripheral angiopathy with gangrene: Secondary | ICD-10-CM | POA: Diagnosis not present

## 2023-03-04 DIAGNOSIS — I872 Venous insufficiency (chronic) (peripheral): Secondary | ICD-10-CM | POA: Diagnosis not present

## 2023-03-04 DIAGNOSIS — L97521 Non-pressure chronic ulcer of other part of left foot limited to breakdown of skin: Secondary | ICD-10-CM | POA: Diagnosis not present

## 2023-03-06 NOTE — Progress Notes (Unsigned)
 Office Visit Note  Patient: Donald Rios             Date of Birth: August 26, 1940           MRN: [id number]             PCP: [name], MD Referring: [name], MD Visit Date: 03/07/2023   Subjective:  No chief complaint on file.   History of Present Illness: Donald Rios is a 83 y.o. male here for follow up ***   Previous HPI 12/28/22 Donald Rios is a 83 y.o. 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  He presents with ongoing issues of leg swelling and a recent episode of vision loss. The vision loss episode, which occurred on a bright day, was severe enough to prevent the patient from voting independently. The patient's vision returned after being in a dark environment, but he reports that his vision becomes blurred on bright days.   The patient has been receiving Actemra infusions to suppress the immune system and has been on a tapering course of prednisone. He is currently taking 20mg  of prednisone daily. The patient has not experienced any repeat episodes of vision changes since starting this treatment regimen.   In addition to these issues, the patient has been dealing with leg swelling, particularly in the right leg. An MRI revealed inflammation and air in the tissue but the bones appeared normal. The patient also has a hematoma on the top of his foot. The patient is not currently on any antibiotics, completed oral doxycycline since a week ago.     Previous HPI 11/09/2022 Donald Rios is a 83 y.o. 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 Prednisone is down to 25 mg anticipates decrease to 20 mg next week. He has no recurrence of headaches and no new vision changes. Biggest problem is his leg swelling, ongoing despite wearing  compressive socks, elevating feet, and on lasix 40 mg daily. Has some areas of skin breakage around the left ankle and heel.   Previous HPI 10/05/2022 Donald Rios is a 83 y.o. 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  Lab results at previous visit looked fine with normal serum inflammatory markers.  He had recent follow-up with Dr. Tanya Nones labs at that time showing improvement in platelets up to 128.  He still has extensive lower extremity edema.  He was needing paperwork for Actemra application today so had not progressed PA yet.   Previous HPI 09/06/22 Donald Rios is a 83 y.o. 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  He has a medical history significant for coronary artery disease status post CABG, ischemic cardiomyopathy, type 2 diabetes, gout, and PMR.  Symptoms started at the beginning of this month with a sudden onset of left eye floaters and blurriness in his vision initially in the lower part of the field and then across his entire eye.  This progressively worsened over 1 to 2 days he saw his ophthalmologist for this found to have disc edema with highly elevated sedimentation rate and CRP and sent to the emergency department for evaluation suspected giant cell  arteritis.  On admission he was also found to be in A-fib with RVR which is new for him.  After the onset of vision changes he also developed jaw pain this was present mildly at rest and worsened with chewing.  Did not experience any recurrence of previous shoulder or hip pain or stiffness.  In hospital he was found to have elevated serum inflammatory markers ultrasound examination of the temporal arteries indicated a positive halo sign.  He underwent MR angiography of the head and neck that showed extensive arterial disease and microvascular disease throughout without any definite large vessel arteritis.   He was initially treated with high-dose intravenous steroids then transition to prednisone 60 mg daily.  Had improvement of symptoms jaw pain completely resolved after about 5 days in total.  Unfortunately left eye vision did not significantly improve has some light sensitivity and can see specks but no visual acuity. Right eye has some cloudiness in his vision this has been present since before these recent events and not changed since they started. He began gradual prednisone taper has decreased down to 20 mg daily at this time has not noticed any particular change or return of symptoms with decrease in the dose.  Does have increase in appetite, increase leg swelling, and increased blood sugars since starting the high-dose prednisone. He underwent TEE with direct-current cardioversion on August 5 unfortunately was reverted back into A-fib by time of cardiology follow-up 1 week later.  Findings also indicated decrease in his previous ejection fraction now indicating 30% with moderate to severe mitral regurgitation. He has a previous history of polymyalgia rheumatica was diagnosed and managed with Dr. Dierdre Forth at [facility] rheumatology from patient's report started in 2018.  This was characterized by fairly typical sounding shoulder and hip joint pain and stiffness and was treated with a long course of oral steroids.  He had been off any maintenance treatment for years and is no longer actively followed with their office.     Labs reviewed 08/12/2022 ESR 51 CRP 56.3 Lipids HDL 34 else WNL   08/2020 HCV neg   Imaging reviewed 08/19/22 BT Abdomen Pelvis W Contrast IMPRESSION: Colonic diverticulosis, without radiographic evidence of diverticulitis or other acute findings.   Cholelithiasis. No radiographic evidence of cholecystitis.   Small left pleural effusion.     08/14/22 Temporal Artery [place] Technically difficult and limited study, see indications above. No compression of bilateral temporal arteries  at proximal, mid, or distal  segments. Abnormal flow noted throughout. Summary:  Presence of a "halo" sign in the bilateral temporal artery suggests temporal arteritis.      08/13/22 MRI/MRA Head/Neck IMPRESSION: MRI HEAD IMPRESSION:   1. No acute intracranial abnormality. 2. 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 3. Innumerable punctate chronic micro hemorrhages involving both cerebral hemispheres. Overall pattern is nonspecific, with differential considerations including sequelae of prior cardiac bypass, chronic hypertension, cerebral amyloid angiopathy, prior trauma, or possibly vasculitis.   MRA HEAD IMPRESSION:   1. Negative intracranial MRA for large vessel occlusion. No hemodynamically significant or correctable stenosis. 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 5. 3 mm supraclinoid left ICA aneurysm.   MRA NECK IMPRESSION:   1. No acute vascular abnormality within the neck. 2. Mild atheromatous irregularity about both carotid artery systems within the neck without hemodynamically significant greater than 50% stenosis. 3. Patent vertebral arteries within the neck.  Right vertebral artery dominant.   No Rheumatology ROS completed.   PMFS History:  Patient Active Problem List   Diagnosis Date Noted   Gangrene of toe of left foot (HCC) 02/10/2023   Cellulitis and abscess of foot 02/09/2023   Sepsis due to cellulitis (HCC) 02/08/2023   High risk medication use 10/25/2022   Screening for tuberculosis 10/25/2022   Atrial fibrillation, chronic (HCC) 08/15/2022   Cardiomyopathy (HCC) 08/15/2022   Temporal arteritis (HCC) 08/13/2022   Anemia 08/04/2022   Protein-calorie malnutrition (HCC) 11/21/2018   Hx of CABG 08/30/2017   Cardiomyopathy, ischemic 12/23/2015   Chronic systolic congestive heart failure, NYHA class 1 (HCC) 12/23/2015    Acute gastric ulcer with hemorrhage 05/06/2013   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    Mild tricuspid regurgitation    Mitral regurgitation    Atrial enlargement, left    Hearing loss    Diverticulosis    Hiatal hernia    Gout    DM2 (diabetes mellitus, type 2) (HCC)    GERD (gastroesophageal reflux disease)    Mixed hyperlipidemia    Essential hypertension     Past Medical History:  Diagnosis Date   Acute gastric ulcer with hemorrhage 05/06/2013   Arthritis    Asthma    Atrial enlargement, left    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    History of MI (myocardial infarction)    History of nuclear stress test 08/07/2010   dipyridamole; EKG negative for ischemia, low risk scan    Hyperlipidemia    Hyperplastic colon polyp 05/2008   Hypertension    Ischemic cardiomyopathy    EF 45%, with inferior wall motion abnormality    Myocardial infarction (HCC)    Valvular regurgitation    mitral and tricuspid (mild)    Family History  Problem Relation Age of Onset   Stroke Father    Hypertension Father    Diabetes Brother    Esophageal cancer Brother    Stroke Maternal Grandmother    Stroke Maternal Grandfather    Stomach cancer Maternal Aunt    Colon cancer Neg Hx    Rectal cancer Neg Hx    Past Surgical History:  Procedure Laterality Date   ABDOMINAL AORTOGRAM W/LOWER EXTREMITY N/A 02/14/2023   Procedure: ABDOMINAL AORTOGRAM W/LOWER EXTREMITY;  Surgeon: Daria Pastures, MD;  Location: [facility] INVASIVE CV LAB;  Service: Cardiovascular;  Laterality: N/A;   CARDIOVERSION N/A 08/16/2022   Procedure: CARDIOVERSION;  Surgeon: Jake Bathe, MD;  Location: [facility] INVASIVE CV  LAB;  Service: Cardiovascular;  Laterality: N/A;   CORONARY ARTERY BYPASS GRAFT  2000   LIMA to LAD, free IMA to OM2, sequential graft to PLA & PLD   ESOPHAGOGASTRODUODENOSCOPY N/A 05/07/2013   Procedure: ESOPHAGOGASTRODUODENOSCOPY (EGD);  Surgeon: Iva Boop, MD;  Location: [facility] ENDOSCOPY;  Service: Endoscopy;  Laterality: N/A;   PERIPHERAL VASCULAR INTERVENTION Left 02/14/2023   Procedure: PERIPHERAL VASCULAR INTERVENTION;  Surgeon: Daria Pastures, MD;  Location: [facility] INVASIVE CV LAB;  Service: Cardiovascular;  Laterality: Left;  SFA   TEE WITHOUT CARDIOVERSION N/A 08/16/2022   Procedure: TRANSESOPHAGEAL  ECHOCARDIOGRAM;  Surgeon: Jake Bathe, MD;  Location: [facility] INVASIVE CV LAB;  Service: Cardiovascular;  Laterality: N/A;   TRANSTHORACIC ECHOCARDIOGRAM  09/07/2010   EF 45-50%; LV systolic function mildly reduced; LA mildly dilated; mild-mod MR & mild-mod TR; aortic root sclerosis/calcification;    Social History   Social History Narrative   Not on file   Immunization History  Administered Date(s) Administered   Fluad Quad(high Dose 65+) 10/17/2018, 10/29/2019, 12/16/2020, 11/16/2021   Influenza Whole 11/22/2002, 11/07/2003, 10/06/2009   Influenza, High Dose Seasonal PF 10/17/2017   Influenza,inj,Quad PF,6+ Mos 11/02/2012, 10/01/2013, 10/04/2014, 10/03/2015, 10/11/2016   PFIZER(Purple Top)SARS-COV-2 Vaccination 02/20/2019, 03/17/2019, 11/10/2019   Pfizer Covid-19 Vaccine Bivalent Booster 72yrs & up 11/06/2020   Pfizer(Comirnaty)Fall Seasonal Vaccine 12 years and older 12/31/2021   Pneumococcal Conjugate-13 01/26/2013   Pneumococcal Polysaccharide-23 10/27/1998, 03/31/2015   Td 03/27/2009   Tdap 05/24/2011, 10/22/2022   Zoster, Live 01/03/2012     Objective: Vital Signs: There were no vitals taken for this visit.   Physical Exam   Musculoskeletal Exam: ***  CDAI Exam: CDAI Score: -- Patient Global: --; Provider Global: -- Swollen: --; Tender: -- Joint Exam 03/07/2023   No  joint exam has been documented for this visit   There is currently no information documented on the homunculus. Go to the Rheumatology activity and complete the homunculus joint exam.  Investigation: No additional findings.  Imaging: VAS [place] LOWER EXT ARTERIAL PSEUDOANEURYSM INJ-RIGHT Result Date: 02/26/2023  ARTERIAL PSEUDOANEURYSM  Patient Name:  REDDING CLOE  Date of Exam:   02/25/2023 Medical Rec #: [id number]       Accession #:    [id number] Date of Birth: January 05, 1941       Patient Gender: M Patient Age:   25 years Exam Location:  [facility] Procedure:      VAS [place] LOWER EXT ARTERIAL PSEUDOANEURYSM INJ-RIGHT Referring Phys: Carolynn Sayers --------------------------------------------------------------------------------  Exam: Right groin Comparison Study: Pseudoaneurysm appears thrombosed since previous exam 30                   minutes prior, 02/25/23. Performing Technologist: Shona Simpson  Examination Guidelines: A complete evaluation includes B-mode imaging, spectral Doppler, color Doppler, and power Doppler as needed of all accessible portions of each vessel. Bilateral testing is considered an integral part of a complete examination. Limited examinations for reoccurring indications may be performed as noted. +------------+----------+--------+------+----------+ Right DuplexPSV (cm/s)WaveformPlaqueComment(s) +------------+----------+--------+------+----------+ Ext.Iliac       84    biphasic                 +------------+----------+--------+------+----------+ CFA            143    biphasic                 +------------+----------+--------+------+----------+  Findings: An area with well defined borders was visualized arising off of the Prox CFA with ultrasound characteristics of a pseudoaneurysm.  Summary: Pseudoaneurysm appears completely thrombosed following thrombin injection. Partial to-and-fro flow seen flashing in neck, but it is not measurably open. Diagnosing physician:  Carolynn Sayers Electronically signed by Carolynn Sayers on 02/26/2023 at 8:41:22 AM.    --------------------------------------------------------------------------------    Final    VAS [place] GROIN PSEUDOANEURYSM Result Date: 02/26/2023  ARTERIAL PSEUDOANEURYSM  Patient Name:  KRISHANG READING  Date of Exam:   02/25/2023 Medical Rec #: [id number]       Accession #:    [id number] Date of Birth: February 24, 1940       Patient  Gender: M Patient Age:   49 years Exam Location:  [facility] Procedure:      VAS [place] Bobetta Lime Referring Phys: Carolynn Sayers --------------------------------------------------------------------------------  Exam: Right groin Indications: Patient complains of bruising, palpable knot and groin pain. History: S/p catheterization. Comparison Study: None. Performing Technologist: Shona Simpson  Examination Guidelines: A complete evaluation includes B-mode imaging, spectral Doppler, color Doppler, and power Doppler as needed of all accessible portions of each vessel. Bilateral testing is considered an integral part of a complete examination. Limited examinations for reoccurring indications may be performed as noted. +------------+----------+--------+------+----------+ Right DuplexPSV (cm/s)WaveformPlaqueComment(s) +------------+----------+--------+------+----------+ Ext.Iliac       82    biphasic                 +------------+----------+--------+------+----------+ CFA             92    biphasic                 +------------+----------+--------+------+----------+  Findings: An area with well defined borders measuring 3.1 cm x 2.3 cm was visualized arising off of the Prox CFA with ultrasound characteristics of a pseudoaneurysm. Mixed echos within the structure suggest that it is partially thrombosed with a residual diameter of 1.3 cm x 1.6 cm. The neck measures approximately 0.6 cm wide and 2.2 cm long.  Diagnosing physician: Carolynn Sayers Electronically signed by Carolynn Sayers on  02/26/2023 at 8:40:57 AM.   --------------------------------------------------------------------------------    Final    PERIPHERAL VASCULAR CATHETERIZATION Result Date: 02/14/2023 Images from the original result were not included. 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  ABIs demonstrated noncompressible vessels bilaterally with an absent toe pressure on the left.  Risks and benefits of angiogram were reviewed, patient expressed understanding and elected to proceed. Findings: Widely patent aorta and bilateral renal arteries.  Widely patent iliac systems bilaterally. Left common femoral artery with some atherosclerosis but no apparent stenosis.  Widely patent profunda.  Widely patent SFA approximately with a focal severe approximate 90% stenosis at the adductor canal.  Widely patent popliteal artery with three-vessel runoff.  Procedure:  The patient was identified in the holding area and taken to the cath lab  The patient was then placed supine on the table and prepped and draped in the usual sterile fashion.  A time out was called.  Ultrasound was used to evaluate the right common femoral artery.  It was patent .  A digital ultrasound image was acquired.  A micropuncture needle was used to access the right common femoral artery under ultrasound guidance.  An 018 wire  was advanced without resistance and a micropuncture sheath was placed.  The 018 wire was removed and a benson wire was placed.  The micropuncture sheath was exchanged for a 5 french sheath.  An omniflush catheter was advanced over the wire to the level of L-1.  An abdominal angiogram was obtained.  Next, using the omniflush catheter  and a glide advantage wire, the aortic bifurcation was crossed and the catheter was placed into theleft external iliac artery and left runoff was obtained. This demonstrated the above findings.  The glide advantage wire was placed to the catheter and into the SFA.  The patient was systemically heparinized.  The short 5 French sheath was exchanged for a 6 [place] by 60 cm catapult sheath.  The SFA lesion was crossed with the glide advantage wire and a 90 cm quick cross.  Angiography via the catheter demonstrated true lumen crossing.  The lesion was then treated with a 7 x 40 Eluvia stent which was postdilated with a 7 x 40 Mustang.  Completion angiography demonstrated an excellent response with minimal residual stenosis and preserved runoff.  The long 6 French sheath was then exchanged for a short 6 French sheath and a minx closure device was deployed with excellent hemostasis. Contrast: 48 cc Sedation: 36 minutes Impression: Excellent response to stenting of distal SFA lesion, maximally revascularized with inline flow to the foot via all 3 tibials Daria Pastures MD [facility] Office: [phone]   VAS [place] LOWER EXTREMITY ARTERIAL DUPLEX Result Date: 02/12/2023 LOWER EXTREMITY ARTERIAL DUPLEX STUDY Patient Name:  DEUNDRE THONG  Date of Exam:   02/12/2023 Medical Rec #: [id number]       Accession #:    [id number] Date of Birth: 07-Dec-1940       Patient Gender: M Patient Age:   51 years Exam Location:  [facility] Procedure:      VAS [place] LOWER EXTREMITY ARTERIAL DUPLEX Referring Phys: Lemar Livings  --------------------------------------------------------------------------------  Indications: Ulceration. High Risk Factors: Hypertension, hyperlipidemia, Diabetes, coronary artery                    disease. Other Factors: Cardiomyopathy, permanent Afib, CABG, history of temporal                arteritis.  Current ABI: Bilateral non compressible TBI: R:0.51 L: absent Comparison Study: No prior study on file Performing Technologist: Sherren Kerns RVS  Examination Guidelines: A complete evaluation includes B-mode imaging, spectral Doppler, color Doppler, and power Doppler as needed of all accessible portions of each vessel. Bilateral testing is considered an integral part of a complete examination. Limited examinations for reoccurring indications may be performed as noted.   +-----------+--------+-----+---------------+-------------------+--------+ LEFT       PSV cm/sRatioStenosis       Waveform           Comments +-----------+--------+-----+---------------+-------------------+--------+ CFA Prox   87                          multiphasic                 +-----------+--------+-----+---------------+-------------------+--------+ CFA Distal 89                          multiphasic                 +-----------+--------+-----+---------------+-------------------+--------+ DFA        152          30-49% stenosis                            +-----------+--------+-----+---------------+-------------------+--------+ SFA Prox   124  multiphasic                 +-----------+--------+-----+---------------+-------------------+--------+ SFA Mid    204          50-74% stenosismonophasic                  +-----------+--------+-----+---------------+-------------------+--------+ SFA Distal 407          75-99% stenosismonophasic                  +-----------+--------+-----+---------------+-------------------+--------+ POP Prox   66                           monophasic                  +-----------+--------+-----+---------------+-------------------+--------+ POP Mid    58                          monophasic                  +-----------+--------+-----+---------------+-------------------+--------+ POP Distal 103          30-49% stenosismonophasic                  +-----------+--------+-----+---------------+-------------------+--------+ ATA Prox   61                          monophasic                  +-----------+--------+-----+---------------+-------------------+--------+ ATA Mid    68                          monophasic                  +-----------+--------+-----+---------------+-------------------+--------+ ATA Distal 111          30-49% stenosismonophasic                  +-----------+--------+-----+---------------+-------------------+--------+ PTA Prox   26                          dampened monophasic         +-----------+--------+-----+---------------+-------------------+--------+ PTA Mid    25                          dampened monophasic         +-----------+--------+-----+---------------+-------------------+--------+ PTA Distal 172          30-49% stenosismonophasic                  +-----------+--------+-----+---------------+-------------------+--------+ PERO Prox  88                          monophasic                  +-----------+--------+-----+---------------+-------------------+--------+ PERO Mid   62                          monophasic                  +-----------+--------+-----+---------------+-------------------+--------+ PERO Distal66                          monophasic                  +-----------+--------+-----+---------------+-------------------+--------+  Summary: Left: 30-49% stenosis noted in the deep femoral artery. 50-74% stenosis noted in the superficial femoral artery. 75-99% stenosis noted in the superficial femoral artery and/or popliteal artery. 30-49%  stenosis noted in the popliteal artery. 30-49% stenosis noted in the anterior tibial artery. 30-49% stenosis noted in the posterior tibial artery. Diffuse calcific plaque noted throughout the left lower extremity arteries.  See table(s) above for measurements and observations. Electronically signed by Lemar Livings MD on 02/12/2023 at 4:02:19 PM.    Final    VAS [place] ABI WITH/WO TBI Result Date: 02/10/2023  LOWER EXTREMITY DOPPLER STUDY Patient Name:  JUSTUS DROKE  Date of Exam:   02/10/2023 Medical Rec #: [id number]       Accession #:    [id number] Date of Birth: 1940-01-23       Patient Gender: M Patient Age:   70 years Exam Location:  [facility] Procedure:      VAS [place] ABI WITH/WO TBI Referring Phys: Bess Harvest [facility] --------------------------------------------------------------------------------  Indications: Ulceration. Cellulitis, abscess, sepsis High Risk         Hypertension, hyperlipidemia, Diabetes, coronary artery Factors:          disease. Other Factors: Cardiomyopathy, CHF, history of CABG.  Limitations: Today's exam was limited due to rapid atrial fibrillation. Comparison Study: No prior study on file Performing Technologist: Sherren Kerns RVS  Examination Guidelines: A complete evaluation includes at minimum, Doppler waveform signals and systolic blood pressure reading at the level of bilateral brachial, anterior tibial, and posterior tibial arteries, when vessel segments are accessible. Bilateral testing is considered an integral part of a complete examination. Photoelectric Plethysmograph (PPG) waveforms and toe systolic pressure readings are included as required and additional duplex testing as needed. Limited examinations for reoccurring indications may be performed as noted.  ABI Findings: +---------+------------------+-----+--------+--------+ Right    Rt Pressure (mmHg)IndexWaveformComment  +---------+------------------+-----+--------+--------+ Brachial 114                                      +---------+------------------+-----+--------+--------+ PTA      255               2.24 biphasic         +---------+------------------+-----+--------+--------+ DP       255               2.24 biphasic         +---------+------------------+-----+--------+--------+ Great Toe58                0.51                  +---------+------------------+-----+--------+--------+ +---------+------------------+-----+--------+-------+ Left     Lt Pressure (mmHg)IndexWaveformComment +---------+------------------+-----+--------+-------+ Brachial 113                                    +---------+------------------+-----+--------+-------+ PTA      255               2.24 biphasic        +---------+------------------+-----+--------+-------+ DP       255               2.24 biphasic        +---------+------------------+-----+--------+-------+ Great Toe0                 0.00 Absent          +---------+------------------+-----+--------+-------+ +-------+---------------------+-----------+------------+------------+  ABI/TBIToday's ABI          Today's TBIPrevious ABIPrevious TBI +-------+---------------------+-----------+------------+------------+ Right  2.24 non compressible0.51                                +-------+---------------------+-----------+------------+------------+ Left   2.24 non compressibleabsent                              +-------+---------------------+-----------+------------+------------+ Arterial wall calcification precludes accurate ankle pressures and ABIs.  Summary: Right: Resting right ankle-brachial index indicates noncompressible right lower extremity arteries. The right toe-brachial index is abnormal. Left: Resting left ankle-brachial index indicates noncompressible left lower extremity arteries. Unable to obtain left TBI secondary to absent pulse. *See table(s) above for measurements and observations.  Electronically signed by  Sherald Hess MD on 02/10/2023 at 1:02:44 PM.    Final    VAS [place] LOWER EXTREMITY VENOUS (DVT) Result Date: 02/09/2023  Lower Venous DVT Study Patient Name:  YOSEF KROGH  Date of Exam:   02/09/2023 Medical Rec #: [id number]       Accession #:    [id number] Date of Birth: 12-Apr-1940       Patient Gender: M Patient Age:   89 years Exam Location:  [facility] Procedure:      VAS [place] LOWER EXTREMITY VENOUS (DVT) Referring Phys: Merrilyn Puma --------------------------------------------------------------------------------  Indications: Swelling. Other Indications: Cellulitis. Comparison Study: No previous exams Performing Technologist: Jody Hill RVT, RDMS  Examination Guidelines: A complete evaluation includes B-mode imaging, spectral Doppler, color Doppler, and power Doppler as needed of all accessible portions of each vessel. Bilateral testing is considered an integral part of a complete examination. Limited examinations for reoccurring indications may be performed as noted. The reflux portion of the exam is performed with the patient in reverse Trendelenburg.  +-----+---------------+---------+-----------+----------+--------------+ RIGHTCompressibilityPhasicitySpontaneityPropertiesThrombus Aging +-----+---------------+---------+-----------+----------+--------------+ CFV  Full           Yes      Yes                                 +-----+---------------+---------+-----------+----------+--------------+   +---------+---------------+---------+-----------+----------+--------------+ LEFT     CompressibilityPhasicitySpontaneityPropertiesThrombus Aging +---------+---------------+---------+-----------+----------+--------------+ CFV      Full           Yes      Yes                                 +---------+---------------+---------+-----------+----------+--------------+ SFJ      Full                                                         +---------+---------------+---------+-----------+----------+--------------+ FV Prox  Full           Yes      Yes                                 +---------+---------------+---------+-----------+----------+--------------+ FV Mid   Full           Yes      Yes                                 +---------+---------------+---------+-----------+----------+--------------+  FV DistalFull           Yes      Yes                                 +---------+---------------+---------+-----------+----------+--------------+ PFV      Full                                                        +---------+---------------+---------+-----------+----------+--------------+ POP      Full           Yes      Yes                                 +---------+---------------+---------+-----------+----------+--------------+ PTV      Full                                                        +---------+---------------+---------+-----------+----------+--------------+ PERO     Full                                                        +---------+---------------+---------+-----------+----------+--------------+    Summary: RIGHT: - No evidence of common femoral vein obstruction.   LEFT: - There is no evidence of deep vein thrombosis in the lower extremity.  - No cystic structure found in the popliteal fossa. Subcutaneous edema seen in area of calf and ankle.  *See table(s) above for measurements and observations. Electronically signed by Heath Lark on 02/09/2023 at 4:11:54 PM.    Final    DG Foot Complete Left Result Date: 02/08/2023 CLINICAL DATA:  Rule out osteomyelitis.  Left foot wound. EXAM: LEFT FOOT - COMPLETE 3+ VIEW COMPARISON:  None Available. FINDINGS: There is a bandage along the dorsal aspect of the foot. Negative for acute fracture or dislocation. No evidence for cortical destruction or periosteal reaction. Spurring involving the calcaneus. Atherosclerotic calcifications in left foot.  IMPRESSION: 1. No acute bone abnormality to the left foot. 2. No radiographic evidence for osteomyelitis. 3. Atherosclerotic calcifications. Electronically Signed   By: Richarda Overlie M.D.   On: 02/08/2023 17:11    Recent Labs: Lab Results  Component Value Date   WBC 17.6 (H) 02/15/2023   HGB 11.4 (L) 02/15/2023   PLT 194 02/15/2023   NA 140 02/15/2023   K 4.7 02/15/2023   CL 107 02/15/2023   CO2 25 02/15/2023   GLUCOSE 122 (H) 02/15/2023   BUN 15 02/15/2023   CREATININE 0.97 02/15/2023   BILITOT 0.8 02/10/2023   ALKPHOS 64 02/10/2023   AST 15 02/10/2023   ALT 20 02/10/2023   PROT 4.2 (L) 02/10/2023   ALBUMIN 2.5 (L) 02/10/2023   CALCIUM 8.9 02/15/2023   GFRAA 72 09/13/2019   QFTBGOLDPLUS NEGATIVE 10/20/2022    Speciality Comments: No specialty comments available.  Procedures:  No procedures performed Allergies: Patient has no known allergies.  Assessment / Plan:     Visit Diagnoses: No diagnosis found.  ***  Orders: No orders of the defined types were placed in this encounter.  No orders of the defined types were placed in this encounter.    Follow-Up Instructions: No follow-ups on file.   Fuller Plan, MD  Note - This record has been created using [employer].  Chart creation errors have been sought, but may not always  have been located. Such creation errors do not reflect on  the standard of medical care.

## 2023-03-07 ENCOUNTER — Other Ambulatory Visit: Payer: Self-pay | Admitting: Family Medicine

## 2023-03-07 ENCOUNTER — Encounter: Payer: Self-pay | Admitting: Internal Medicine

## 2023-03-07 ENCOUNTER — Ambulatory Visit (INDEPENDENT_AMBULATORY_CARE_PROVIDER_SITE_OTHER): Payer: Medicare Other | Admitting: Internal Medicine

## 2023-03-07 VITALS — BP 104/60 | HR 93 | Resp 14 | Ht 68.0 in | Wt 149.0 lb

## 2023-03-07 DIAGNOSIS — Z79899 Other long term (current) drug therapy: Secondary | ICD-10-CM

## 2023-03-07 DIAGNOSIS — L03119 Cellulitis of unspecified part of limb: Secondary | ICD-10-CM | POA: Diagnosis not present

## 2023-03-07 DIAGNOSIS — L02619 Cutaneous abscess of unspecified foot: Secondary | ICD-10-CM

## 2023-03-07 DIAGNOSIS — M316 Other giant cell arteritis: Secondary | ICD-10-CM

## 2023-03-07 DIAGNOSIS — I96 Gangrene, not elsewhere classified: Secondary | ICD-10-CM

## 2023-03-07 DIAGNOSIS — S91302S Unspecified open wound, left foot, sequela: Secondary | ICD-10-CM | POA: Diagnosis not present

## 2023-03-08 ENCOUNTER — Encounter: Payer: Self-pay | Admitting: Orthopedic Surgery

## 2023-03-08 ENCOUNTER — Ambulatory Visit (INDEPENDENT_AMBULATORY_CARE_PROVIDER_SITE_OTHER): Payer: Medicare Other | Admitting: Orthopedic Surgery

## 2023-03-08 ENCOUNTER — Ambulatory Visit (HOSPITAL_COMMUNITY): Payer: Medicare Other | Admitting: Physical Therapy

## 2023-03-08 DIAGNOSIS — L03116 Cellulitis of left lower limb: Secondary | ICD-10-CM | POA: Diagnosis not present

## 2023-03-08 DIAGNOSIS — I872 Venous insufficiency (chronic) (peripheral): Secondary | ICD-10-CM

## 2023-03-08 DIAGNOSIS — L97521 Non-pressure chronic ulcer of other part of left foot limited to breakdown of skin: Secondary | ICD-10-CM | POA: Diagnosis not present

## 2023-03-08 DIAGNOSIS — E1152 Type 2 diabetes mellitus with diabetic peripheral angiopathy with gangrene: Secondary | ICD-10-CM | POA: Diagnosis not present

## 2023-03-08 DIAGNOSIS — L02612 Cutaneous abscess of left foot: Secondary | ICD-10-CM | POA: Diagnosis not present

## 2023-03-08 DIAGNOSIS — Z48812 Encounter for surgical aftercare following surgery on the circulatory system: Secondary | ICD-10-CM | POA: Diagnosis not present

## 2023-03-08 LAB — CBC WITH DIFFERENTIAL/PLATELET
Absolute Lymphocytes: 1150 {cells}/uL (ref 850–3900)
Absolute Monocytes: 454 {cells}/uL (ref 200–950)
Basophils Absolute: 28 {cells}/uL (ref 0–200)
Basophils Relative: 0.2 %
Eosinophils Absolute: 71 {cells}/uL (ref 15–500)
Eosinophils Relative: 0.5 %
HCT: 33.4 % — ABNORMAL LOW (ref 38.5–50.0)
Hemoglobin: 10.4 g/dL — ABNORMAL LOW (ref 13.2–17.1)
MCH: 32.7 pg (ref 27.0–33.0)
MCHC: 31.1 g/dL — ABNORMAL LOW (ref 32.0–36.0)
MCV: 105 fL — ABNORMAL HIGH (ref 80.0–100.0)
MPV: 13.6 fL — ABNORMAL HIGH (ref 7.5–12.5)
Monocytes Relative: 3.2 %
Neutro Abs: 12496 {cells}/uL — ABNORMAL HIGH (ref 1500–7800)
Neutrophils Relative %: 88 %
Platelets: 167 10*3/uL (ref 140–400)
RBC: 3.18 10*6/uL — ABNORMAL LOW (ref 4.20–5.80)
RDW: 14.3 % (ref 11.0–15.0)
Total Lymphocyte: 8.1 %
WBC: 14.2 10*3/uL — ABNORMAL HIGH (ref 3.8–10.8)

## 2023-03-08 LAB — COMPLETE METABOLIC PANEL WITH GFR
AG Ratio: 2.2 (calc) (ref 1.0–2.5)
ALT: 14 U/L (ref 9–46)
AST: 11 U/L (ref 10–35)
Albumin: 3.5 g/dL — ABNORMAL LOW (ref 3.6–5.1)
Alkaline phosphatase (APISO): 81 U/L (ref 35–144)
BUN/Creatinine Ratio: 8 (calc) (ref 6–22)
BUN: 10 mg/dL (ref 7–25)
CO2: 29 mmol/L (ref 20–32)
Calcium: 9.2 mg/dL (ref 8.6–10.3)
Chloride: 103 mmol/L (ref 98–110)
Creat: 1.24 mg/dL — ABNORMAL HIGH (ref 0.70–1.22)
Globulin: 1.6 g/dL — ABNORMAL LOW (ref 1.9–3.7)
Glucose, Bld: 180 mg/dL — ABNORMAL HIGH (ref 65–99)
Potassium: 4 mmol/L (ref 3.5–5.3)
Sodium: 141 mmol/L (ref 135–146)
Total Bilirubin: 1.2 mg/dL (ref 0.2–1.2)
Total Protein: 5.1 g/dL — ABNORMAL LOW (ref 6.1–8.1)
eGFR: 58 mL/min/{1.73_m2} — ABNORMAL LOW (ref >=60)

## 2023-03-08 LAB — SEDIMENTATION RATE: Sed Rate: 22 mm/h — ABNORMAL HIGH (ref 0–20)

## 2023-03-08 LAB — C-REACTIVE PROTEIN: CRP: 24.9 mg/L — ABNORMAL HIGH (ref <8.0)

## 2023-03-08 MED ORDER — PREDNISONE 5 MG PO TABS: 5.0000 mg | ORAL_TABLET | Freq: Every day | ORAL | 2 refills | Status: DC

## 2023-03-08 NOTE — Progress Notes (Signed)
 Office Visit Note   Patient: Donald Rios           Date of Birth: [birth year]           MRN: [id number] Visit Date: 03/08/2023              Requested by: [name], MD [address] [place],  [place] [address] PCP: [name], MD  Chief Complaint  Patient presents with   Left Foot - Wound Check      HPI: Patient is an 83 year old gentleman who presents with brawny edema venous stasis swelling left lower extremity.  Patient states he uses a cane for ambulation.  He states that his leg is too painful to walk.  He states he spends most of his time in the reclining chair as well as for sleeping.  Patient states that he cannot shower or wash his foot.  Assessment & Plan: Visit Diagnoses:  1. Venous stasis dermatitis of left lower extremity     Plan: Recommended starting to walk.  Recommended knee-high compression stockings.  Recommended athlete's foot powder for the fungal dermatitis left foot.  Discussed that if he has acute changes we could proceed with a multilayer knee-high compression wrap.  Follow-Up Instructions: Return if symptoms worsen or fail to improve.   Ortho Exam  Patient is alert, oriented, no adenopathy, well-dressed, normal affect, normal respiratory effort. Examination patient has brawny edema left leg without papillomata's changes.  There are no ulcers.  The foot is globally tender to palpation.  He has fungal rash changes with dry peeling skin.  There is no cellulitis.  With the Doppler patient has a strong biphasic dorsalis pedis pulse.  Patient is status post revascularization.  Imaging: No results found. No images are attached to the encounter.  Labs: Lab Results  Component Value Date   HGBA1C 6.2 (H) 02/08/2023   HGBA1C 5.8 (H) 08/14/2022   HGBA1C 5.6 05/14/2022   ESRSEDRATE 22 (H) 03/07/2023   ESRSEDRATE 2 12/28/2022   ESRSEDRATE 6 11/09/2022   CRP 24.9 (H) 03/07/2023   CRP <0.5 02/14/2023   CRP <0.5 02/13/2023   LABURIC  4.6 04/11/2012   REPTSTATUS 02/13/2023 FINAL 02/08/2023   CULT  02/08/2023    NO GROWTH 5 DAYS Performed at [facility], [address] [place], [place] [address]      Lab Results  Component Value Date   ALBUMIN 2.5 (L) 02/10/2023   ALBUMIN 3.6 02/08/2023   ALBUMIN 3.8 12/02/2022    Lab Results  Component Value Date   MG 2.1 02/14/2023   MG 2.1 02/13/2023   MG 2.1 02/12/2023   No results found for: "VD25OH"  No results found for: "PREALBUMIN"    Latest Ref Rng & Units 03/07/2023   11:11 AM 02/15/2023    4:14 AM 02/14/2023    4:59 AM  CBC EXTENDED  WBC 3.8 - 10.8 Thousand/uL 14.2  17.6  17.5   RBC 4.20 - 5.80 Million/uL 3.18  3.31  3.07   Hemoglobin 13.2 - 17.1 g/dL 53.6  64.4  03.4   HCT 38.5 - 50.0 % 33.4  34.9  32.3   Platelets 140 - 400 Thousand/uL 167  194  157   NEUT# 1,500 - 7,800 cells/uL 12,496  13.7  13.0   Lymph# 0.7 - 4.0 K/uL  2.4  2.6      There is no height or weight on file to calculate BMI.  Orders:  No orders  of the defined types were placed in this encounter.  No orders of the defined types were placed in this encounter.    Procedures: No procedures performed  Clinical Data: No additional findings.  ROS:  All other systems negative, except as noted in the HPI. Review of Systems  Objective: Vital Signs: There were no vitals taken for this visit.  Specialty Comments:  No specialty comments available.  PMFS History: Patient Active Problem List   Diagnosis Date Noted   Gangrene of toe of left foot (HCC) 02/10/2023   Cellulitis and abscess of foot 02/09/2023   Sepsis due to cellulitis (HCC) 02/08/2023   High risk medication use 10/25/2022   Screening for tuberculosis 10/25/2022   Atrial fibrillation, chronic (HCC) 08/15/2022   Cardiomyopathy (HCC) 08/15/2022   Temporal arteritis (HCC) 08/13/2022   Anemia 08/04/2022   Protein-calorie malnutrition (HCC) 11/21/2018   Hx of CABG 08/30/2017   Cardiomyopathy, ischemic  12/23/2015   Chronic systolic congestive heart failure, NYHA class 1 (HCC) 12/23/2015   Acute gastric ulcer with hemorrhage 05/06/2013   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    Mild tricuspid regurgitation    Mitral regurgitation    Atrial enlargement, left    Hearing loss    Diverticulosis    Hiatal hernia    Gout    DM2 (diabetes mellitus, type 2) (HCC)    GERD (gastroesophageal reflux disease)    Mixed hyperlipidemia    Essential hypertension    Past Medical History:  Diagnosis Date   Acute gastric ulcer with hemorrhage 05/06/2013   Arthritis    Asthma    Atrial enlargement, left    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    History of MI (myocardial infarction)    History of nuclear stress test 08/07/2010   dipyridamole; EKG negative for ischemia, low risk scan    Hyperlipidemia    Hyperplastic colon polyp 05/2008   Hypertension    Ischemic cardiomyopathy    EF 45%, with inferior wall motion abnormality    Myocardial infarction (HCC)    Valvular regurgitation    mitral and tricuspid (mild)    Family History  Problem Relation Age of Onset   Stroke Father    Hypertension Father    Diabetes Brother    Esophageal cancer Brother    Stroke Maternal Grandmother    Stroke Maternal Grandfather    Stomach cancer Maternal Aunt    Colon cancer Neg Hx    Rectal cancer Neg Hx     Past Surgical History:  Procedure Laterality Date   ABDOMINAL AORTOGRAM W/LOWER EXTREMITY N/A 02/14/2023   Procedure: ABDOMINAL AORTOGRAM W/LOWER EXTREMITY;  Surgeon: Daria Pastures, MD;  Location: [facility];  Service: Cardiovascular;  Laterality: N/A;   CARDIOVERSION N/A 08/16/2022    Procedure: CARDIOVERSION;  Surgeon: Jake Bathe, MD;  Location: [facility];  Service: Cardiovascular;  Laterality: N/A;   CORONARY ARTERY BYPASS GRAFT  2000   LIMA to LAD, free IMA to OM2, sequential graft to PLA & PLD   ESOPHAGOGASTRODUODENOSCOPY N/A 05/07/2013   Procedure: ESOPHAGOGASTRODUODENOSCOPY (EGD);  Surgeon:  Iva Boop, MD;  Location: [facility] ENDOSCOPY;  Service: Endoscopy;  Laterality: N/A;   PERIPHERAL VASCULAR INTERVENTION Left 02/14/2023   Procedure: PERIPHERAL VASCULAR INTERVENTION;  Surgeon: Daria Pastures, MD;  Location: [facility] INVASIVE CV LAB;  Service: Cardiovascular;  Laterality: Left;  SFA   TEE WITHOUT CARDIOVERSION N/A 08/16/2022   Procedure: TRANSESOPHAGEAL ECHOCARDIOGRAM;  Surgeon: Jake Bathe, MD;  Location: [facility];  Service: Cardiovascular;  Laterality: N/A;   TRANSTHORACIC ECHOCARDIOGRAM  09/07/2010   EF 45-50%; LV systolic function mildly reduced; LA mildly dilated; mild-mod MR & mild-mod TR; aortic root sclerosis/calcification;    Social History   Occupational History   Not on file  Tobacco Use   Smoking status: Former    Current packs/day: 0.00    Types: Cigarettes    Quit date: 11/04/1985    Years since quitting: 37.3   Smokeless tobacco: Never  Vaping Use   Vaping status: Never Used  Substance and Sexual Activity   Alcohol use: Not Currently    Alcohol/week: 2.0 - 3.0 standard drinks of alcohol    Types: 2 - 3 Cans of beer per week   Drug use: No   Sexual activity: Not on file

## 2023-03-08 NOTE — Progress Notes (Signed)
 Sed rate increased to 22 up from 2.  CRP is increased at 24.9 up from less than 0.5.  I definitely think he will need to get back on the regular infusion scheduled for the Actemra monthly.  Looks like his last dose was January 16 so he would be due for this any time now. Is he currently taking any of the prednisone?  If not I would also recommend to resume at least taking 5 mg once daily, which will hopefully be a low enough dose do not worsen leg swelling.

## 2023-03-09 ENCOUNTER — Other Ambulatory Visit: Payer: Self-pay

## 2023-03-09 DIAGNOSIS — I739 Peripheral vascular disease, unspecified: Secondary | ICD-10-CM

## 2023-03-10 ENCOUNTER — Ambulatory Visit (HOSPITAL_COMMUNITY)
Admission: RE | Admit: 2023-03-10 | Discharge: 2023-03-10 | Disposition: A | Discharge status: Home / Self Care | Payer: Medicare Other | Source: Ambulatory Visit | Attending: Neurosurgery | Admitting: Neurosurgery

## 2023-03-10 DIAGNOSIS — E1152 Type 2 diabetes mellitus with diabetic peripheral angiopathy with gangrene: Secondary | ICD-10-CM | POA: Diagnosis not present

## 2023-03-10 DIAGNOSIS — Z48812 Encounter for surgical aftercare following surgery on the circulatory system: Secondary | ICD-10-CM | POA: Diagnosis not present

## 2023-03-10 DIAGNOSIS — I872 Venous insufficiency (chronic) (peripheral): Secondary | ICD-10-CM | POA: Diagnosis not present

## 2023-03-10 DIAGNOSIS — L97521 Non-pressure chronic ulcer of other part of left foot limited to breakdown of skin: Secondary | ICD-10-CM | POA: Diagnosis not present

## 2023-03-10 DIAGNOSIS — L03116 Cellulitis of left lower limb: Secondary | ICD-10-CM | POA: Diagnosis not present

## 2023-03-10 DIAGNOSIS — I671 Cerebral aneurysm, nonruptured: Secondary | ICD-10-CM | POA: Diagnosis not present

## 2023-03-10 DIAGNOSIS — L02612 Cutaneous abscess of left foot: Secondary | ICD-10-CM | POA: Diagnosis not present

## 2023-03-11 ENCOUNTER — Ambulatory Visit (HOSPITAL_COMMUNITY): Payer: Medicare Other | Admitting: Physical Therapy

## 2023-03-13 NOTE — Progress Notes (Unsigned)
 Cardiology Clinic Note   Patient Name: Donald Rios Date of Encounter: 03/16/2023  Primary Care Provider:  [name], MD Primary Cardiologist:  Chrystie Nose, MD  Patient Profile    Donald Rios 83 year old male presents to the clinic today for follow-up evaluation of his atrial fibrillation.  Past Medical History    Past Medical History:  Diagnosis Date   Acute gastric ulcer with hemorrhage 05/06/2013   Arthritis    Asthma    Atrial enlargement, left    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    History of MI (myocardial infarction)    History of nuclear stress test 08/07/2010   dipyridamole; EKG negative for ischemia, low risk scan    Hyperlipidemia    Hyperplastic colon polyp 05/2008   Hypertension    Ischemic cardiomyopathy    EF 45%, with inferior wall motion abnormality    Myocardial infarction (HCC)    Valvular regurgitation    mitral and tricuspid (mild)   Past Surgical History:  Procedure Laterality Date   ABDOMINAL AORTOGRAM W/LOWER EXTREMITY N/A 02/14/2023   Procedure: ABDOMINAL AORTOGRAM W/LOWER EXTREMITY;  Surgeon: Daria Pastures, MD;  Location: [facility] INVASIVE CV [facility];  Service: Cardiovascular;  Laterality: N/A;   CARDIOVERSION N/A 08/16/2022   Procedure: CARDIOVERSION;  Surgeon: Jake Bathe, MD;  Location: [facility] IN[facility] CV [facility];  Service: Cardiovascular;  Laterality: N/A;   CORONARY ARTERY BYPASS GRAFT  2000   LIMA to LAD, free IMA to OM2, sequential graft to PLA & PLD   ESOPHAGOGASTRODUODENOSCOPY N/A 05/07/2013   Procedure: ESOPHAGOGASTRODUODENOSCOPY (EGD);  Surgeon: Iva Boop, MD;  Location: [facility] ENDOSCOPY;  Service: Endoscopy;  Laterality: N/A;    PERIPHERAL VASCULAR INTERVENTION Left 02/14/2023   Procedure: PERIPHERAL VASCULAR INTERVENTION;  Surgeon: Daria Pastures, MD;  Location: [facility] INVASIVE CV LAB;  Service: Cardiovascular;  Laterality: Left;  SFA   TEE WITHOUT CARDIOVERSION N/A 08/16/2022   Procedure: TRANSESOPHAGEAL ECHOCARDIOGRAM;  Surgeon: Jake Bathe, MD;  Location: [facility] INVAS[facility] CV LAB;  Service: Cardiovascular;  Laterality: N/A;   TRANSTHORACIC ECHOCARDIOGRAM  09/07/2010   EF 45-50%; LV systolic function mildly reduced; LA mildly dilated; mild-mod MR & mild-mod TR; aortic root sclerosis/calcification;     Allergies  No Known Allergies  History of Present Illness    Donald Rios has a PMH of coronary artery disease status post CABG in 2000 (LIMA-LAD, LIMA-OM 2 and SVG-PLA and PLD.  Echocardiogram at that time showed an LVEF of 45-50%.  A1c was 5.6 down from 6.0.  His LDL particle #1035 and LDL-C of 79.  His simvastatin was reduced to 40 mg daily due to muscle aches.  His PMH also includes hyperlipidemia, chronic systolic CHF, and atrial fibrillation.  He continue to follow-up with Dr. Rennis Golden regularly.  He underwent stress testing in 2015 which showed no ischemia.  He continued to have good control with his diabetes and hyperlipidemia.  Echocardiogram 2021 showed normal LV function.  He was seen in follow-up by Dr. Rennis Golden 01/13/2023.  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  He underwent TEE and cardioversion for atrial fibrillation.  His LVEF was noted to be 25-30%.  However, he returned back to atrial fibrillation after converting to sinus rhythm.  His repeat echocardiogram 11/24 after some up titration of GDMT showed an EF of 25%.  His RV function was also moderately reduced.  He was noted to have moderate left atrial enlargement and mild right atrial enlargement.  Moderate mitral valve regurgitation was  noted.  He felt that he was not that symptomatic.  He denied chest pain but did report some fatigue and shortness of breath.  He was started on amiodarone 200 mg twice daily followed by 200 mg daily.  Plan for follow-up in 4 weeks with repeat EKG was made.  If he remained in atrial fibrillation plan for DCCV with Dr. Rennis Golden was discussed.  Also, it was recommended that we continue to uptitrate GDMT.  It was felt that he would also need ischemic evaluation.  He presents to the clinic today for follow-up evaluation and states he was taking his Eliquis once daily due to a nosebleed.  We reviewed his recent hospitalization 02/09/2023 until 02/15/2023.  He was sent to the emergency room due to having more lower extremity pain and redness from the wound care center.  In the emergency room he was hypotensive and improved with IV fluids.  He continued to be in atrial fibrillation.  His leukocyte count was 31,000.  His blood sugar was 235.  His creatinine was 1.56 and his lactic acid was noted to be 3.2.  Blood cultures were collected and he was started on broad-spectrum antibiotics.  Today in clinic his EKG shows atrial fibrillation with rapid ventricular response 122 bpm.  He reports decreased endurance and fatigue.  His blood pressure today is 115/58 and on recheck his pulse is 104.  Case was reviewed with DOD.  I will uptitrate his metoprolol to 75 mg twice daily, reiterated the importance of taking his Eliquis twice daily and will plan follow-up in 3 weeks to reevaluate for DCCV.  Today he denies chest pain, shortness of breath, lower extremity edema,  palpitations, melena, hematuria, hemoptysis, diaphoresis, weakness, presyncope, syncope, orthopnea, and PND.     Home Medications    Prior to Admission medications   Medication Sig Start Date End Date Taking? Authorizing Provider  allopurinol (ZYLOPRIM) 300 MG tablet TAKE 1 TABLET BY MOUTH DAILY Patient taking differently: Take 300 mg by mouth at bedtime.  01/28/23   [name], MD  amiodarone (PACERONE) 200 MG tablet Take 1 tablet by mouth twice daily for 14 days then decrease to 1 tablet once daily. 01/13/23   Hilty, Lisette Abu, MD  apixaban (ELIQUIS) 5 MG TABS tablet Take 1 tablet (5 mg total) by mouth 2 (two) times daily. Patient taking differently: Take 5 mg by mouth daily. 09/20/22   [name], MD  aspirin EC 81 MG tablet Take 1 tablet (81 mg total) by mouth daily. Swallow whole. 02/16/23   Hughie Closs, MD  clopidogrel (PLAVIX) 75 MG tablet Take 1 tablet (75 mg total) by mouth daily. 02/16/23 03/18/23  Hughie Closs, MD  doxazosin (CARDURA) 2 MG tablet TAKE 1 TABLET BY MOUTH DAILY Patient taking differently: Take 2 mg by mouth at bedtime. 08/04/22   [name], MD  empagliflozin (JARDIANCE) 10 MG TABS tablet Take 1 tablet (10 mg total) by mouth daily. 08/16/22   Laverda Page  B, NP  furosemide (LASIX) 40 MG tablet Take 1 tablet (40 mg total) by mouth 2 (two) times daily. 11/23/22   [name], MD  glipiZIDE (GLIPIZIDE XL) 5 MG 24 hr tablet Take 1 tablet (5 mg total) by mouth daily with breakfast. 12/21/22   [name], MD  HYDROcodone-acetaminophen (NORCO/VICODIN) 5-325 MG tablet Take 1 tablet by mouth every 6 (six) hours as needed for moderate pain (pain score 4-6). 02/28/23   [name], MD  insulin aspart (FIASP FLEXTOUCH) 100 UNIT/ML FlexTouch Pen Before each meal 3 times a day, 140-199 - 2 units, 200-250 - 4 units, 251-299 - 6 units,  300-349 - 8 units,  350 or above 10 units. Patient taking differently: Inject 3-7 Units into the skin as needed. Before each meal 3 times a day, 140-199 - 2 units, 200-250 - 4 units, 251-299 - 6 units,  300-349 - 8 units,  350 or above 10 units. 08/16/22   Leroy Sea, MD  metoprolol tartrate (LOPRESSOR) 50 MG tablet TAKE 1 AND 1/2 TABLETS BY MOUTH TWO TIMES A DAY PT NEEDS CPE APPOINTMENT FOR FUTURE REFILLS Patient not taking: Reported on 03/07/2023 03/04/23   [name],  MD  metoprolol tartrate (LOPRESSOR) 50 MG tablet Take 1 tablet (50 mg total) by mouth 2 (two) times daily. 02/15/23 03/17/23  Hughie Closs, MD  mirtazapine (REMERON) 30 MG tablet TAKE 1 TABLET BY MOUTH EVERY NIGHT AT BEDTIME FOR APPETITE 03/07/23   [name], MD  pantoprazole (PROTONIX) 40 MG tablet TAKE 1 TABLET BY MOUTH DAILY 03/04/23   [name], MD  predniSONE (DELTASONE) 5 MG tablet Take 1 tablet (5 mg total) by mouth daily with breakfast. 03/08/23   Fuller Plan, MD  rosuvastatin (CRESTOR) 40 MG tablet Take 1 tablet (40 mg total) by mouth daily. 02/15/23 02/15/24  Hughie Closs, MD  silver sulfADIAZINE (SILVADENE) 1 % cream Apply 1 Application topically daily. 02/02/23   [provider]  simvastatin (ZOCOR) 40 MG tablet Take 40 mg by mouth at bedtime. Patient not taking: Reported on 03/07/2023 02/16/23   [provider]  spironolactone (ALDACTONE) 25 MG tablet Take 1 tablet (25 mg total) by mouth 2 (two) times daily. 11/23/22 02/21/23  [name], MD    Family History    Family History  Problem Relation Age of Onset   Stroke Father    Hypertension Father    Diabetes Brother    Esophageal cancer Brother    Stroke Maternal Grandmother    Stroke Maternal Grandfather    Stomach cancer Maternal Aunt    Colon cancer Neg Hx    Rectal cancer Neg Hx    He indicated that his father is deceased. He indicated that his brother is deceased. He indicated that the status of his maternal grandmother is unknown. He indicated that the status of his maternal grandfather is unknown. He indicated that the status of his maternal aunt is unknown. He indicated that the status of his neg hx is unknown.  Social History    Social History   Socioeconomic History   Marital status: Married    Spouse name: Not on file   Number of children: 1   Years of education: Not on file   Highest education level: Not on file  Occupational History   Not on file  Tobacco Use    Smoking status: Former    Current packs/day: 0.00    Types: Cigarettes    Quit  date: 11/04/1985    Years since quitting: 37.3   Smokeless tobacco: Never  Vaping Use   Vaping status: Never Used  Substance and Sexual Activity   Alcohol use: Not Currently    Alcohol/week: 2.0 - 3.0 standard drinks of alcohol    Types: 2 - 3 Cans of beer per week   Drug use: No   Sexual activity: Not on file  Other Topics Concern   Not on file  Social History Narrative   Not on file   Social Drivers of Health   Financial Resource Strain: Low Risk  (08/24/2022)   Overall Financial Resource Strain (CARDIA)    Difficulty of Paying Living Expenses: Not hard at all  Food Insecurity: No Food Insecurity (02/16/2023)   Hunger Vital Sign    Worried About Running Out of Food in the Last Year: Never true    Ran Out of Food in the Last Year: Never true  Transportation Needs: No Transportation Needs (02/16/2023)   PRAPARE - Administrator, Civil Service (Medical): No    Lack of Transportation (Non-Medical): No  Physical Activity: Sufficiently Active (07/10/2021)   Exercise Vital Sign    Days of Exercise per Week: 5 days    Minutes of Exercise per Session: 30 min  Stress: No Stress Concern Present (08/24/2022)   [employer] of Occupational Health - Occupational Stress Questionnaire    Feeling of Stress : Only a little  Social Connections: Moderately Integrated (02/09/2023)   Social Connection and Isolation Panel [NHANES]    Frequency of Communication with Friends and Family: Twice a week    Frequency of Social Gatherings with Friends and Family: Twice a week    Attends Religious Services: More than 4 times per year    Active Member of [employer] or Organizations: No    Attends Banker Meetings: Never    Marital Status: Married  Catering manager Violence: Not At Risk (02/16/2023)   Humiliation, Afraid, Rape, and Kick questionnaire    Fear of Current or Ex-Partner: No    Emotionally  Abused: No    Physically Abused: No    Sexually Abused: No     Review of Systems    General:  No chills, fever, night sweats or weight changes.  Cardiovascular:  No chest pain, dyspnea on exertion, edema, orthopnea, palpitations, paroxysmal nocturnal dyspnea. Dermatological: No rash, lesions/masses Respiratory: No cough, dyspnea Urologic: No hematuria, dysuria Abdominal:   No nausea, vomiting, diarrhea, bright red blood per rectum, melena, or hematemesis Neurologic:  No visual changes, wkns, changes in mental status. All other systems reviewed and are otherwise negative except as noted above.  Physical Exam    VS:  BP (!) 115/58 (BP Location: Left Arm, Patient Position: Sitting, Cuff Size: Normal)   Pulse (!) 118   Ht 5\' 8"  (1.727 m)   Wt 135 lb (61.2 kg)   BMI 20.53 kg/m  , BMI Body mass index is 20.53 kg/m. GEN: Well nourished, well developed, in no acute distress. HEENT: normal. Neck: Supple, no JVD, carotid bruits, or masses. Cardiac: RRR, no murmurs, rubs, or gallops. No clubbing, cyanosis, left ankle edema.  Radials/DP/PT 2+ and equal bilaterally.  Respiratory:  Respirations regular and unlabored, clear to auscultation bilaterally. GI: Soft, nontender, nondistended, BS + x 4. MS: no deformity or atrophy. Skin: warm and dry, no rash. Neuro:  Strength and sensation are intact. Psych: Normal affect.  Accessory Clinical Findings    Recent Labs: 08/14/2022: TSH 1.145 11/23/2022: Brain  Natriuretic Peptide 390 02/14/2023: Magnesium 2.1 03/07/2023: ALT 14; BUN 10; Creat 1.24; Hemoglobin 10.4; Platelets 167; Potassium 4.0; Sodium 141   Recent Lipid Panel    Component Value Date/Time   CHOL 151 12/02/2022 1502   CHOL 128 08/17/2012 0813   TRIG 101.0 12/02/2022 1502   TRIG 69 08/17/2012 0813   HDL 54.60 12/02/2022 1502   HDL 56 08/17/2012 0813   CHOLHDL 3 12/02/2022 1502   VLDL 20.2 12/02/2022 1502   LDLCALC 76 12/02/2022 1502   LDLCALC 46 05/14/2022 0836   LDLCALC 58  08/17/2012 0813         ECG personally reviewed by me today- EKG Interpretation Date/Time:  Wednesday March 16 2023 15:03:37 EST Ventricular Rate:  122 PR Interval:    QRS Duration:  138 QT Interval:  416 QTC Calculation: 592 R Axis:   -65  Text Interpretation: Atrial fibrillation with rapid ventricular response with premature ventricular or aberrantly conducted complexes Right bundle branch block Left anterior fascicular block Bifascicular block Minimal voltage criteria for LVH, may be normal variant ( R in aVL ) Inferior infarct , age undetermined Anterolateral infarct , age undetermined When compared with ECG of 16-Mar-2023 14:50, Significant changes have occurred Confirmed by Edd Fabian [phone]) on 03/16/2023 3:42:45 PM     Nuclear stress test 07/18/2018 Nuclear stress EF: 51%. There was no ST segment deviation noted during stress. PVCs noted at rest and with stress. Defect 1: There is a medium defect of severe severity present in the basal inferior, basal inferolateral, mid inferior, mid inferolateral, apical inferior and apex location. Findings consistent with prior myocardial infarction. This is an intermediate risk study. The left ventricular ejection fraction is mildly decreased (45-54%).   Compared to the prior report, no significant change has occurred. No images available to review.  TEE 08/16/2022  IMPRESSIONS     1. Left ventricular ejection fraction, by estimation, is 25 to 30%. The  left ventricle has severely decreased function. The left ventricle  demonstrates global hypokinesis.   2. Right ventricular systolic function is normal. The right ventricular  size is normal.   3. Left atrial size was moderately dilated. No left atrial/left atrial  appendage thrombus was detected.   4. The mitral valve is normal in structure. Moderate to severe mitral  valve regurgitation. No evidence of mitral stenosis.   5. The aortic valve is tricuspid. Aortic valve regurgitation  is not  visualized. No aortic stenosis is present.   6. The inferior vena cava is normal in size with greater than 50%  respiratory variability, suggesting right atrial pressure of 3 mmHg.   FINDINGS   Left Ventricle: Left ventricular ejection fraction, by estimation, is 25  to 30%. The left ventricle has severely decreased function. The left  ventricle demonstrates global hypokinesis. The left ventricular internal  cavity size was normal in size. There  is no left ventricular hypertrophy.   Right Ventricle: The right ventricular size is normal. No increase in  right ventricular wall thickness. Right ventricular systolic function is  normal.   Left Atrium: Left atrial size was moderately dilated. No left atrial/left  atrial appendage thrombus was detected.   Right Atrium: Right atrial size was normal in size.   Pericardium: There is no evidence of pericardial effusion.   Mitral Valve: The mitral valve is normal in structure. Moderate to severe  mitral valve regurgitation. No evidence of mitral valve stenosis.   Tricuspid Valve: The tricuspid valve is normal in structure. Tricuspid  valve  regurgitation is mild . No evidence of tricuspid stenosis.   Aortic Valve: The aortic valve is tricuspid. Aortic valve regurgitation is  not visualized. No aortic stenosis is present.   Pulmonic Valve: The pulmonic valve was normal in structure. Pulmonic valve  regurgitation is not visualized. No evidence of pulmonic stenosis.   Aorta: The aortic root is normal in size and structure.   Venous: The inferior vena cava is normal in size with greater than 50%  respiratory variability, suggesting right atrial pressure of 3 mmHg.   IAS/Shunts: No atrial level shunt detected by color flow Doppler.   [name] MD  Electronically signed by [name] MD  Signature Date/Time: 08/16/2022/11:23:05 AM        Final     Assessment & Plan   1.  Persistent atrial fibrillation-EKG today shows  atrial fibrillation 122 bpm.  Pulse on recheck is 104.  Reports compliance with apixaban.  Reports that he was only taking Eliquis once daily due to nosebleeds.  We reviewed the importance of taking his Eliquis twice daily.  Case was reviewed with DOD.  We will increase his rate control and have him follow-up in 3 weeks for reevaluation.  He is cardiac unaware.  He does note fatigue and decreased endurance.  He denies chest pain or shortness of breath at this time. Continue apixaban, amiodarone Increase metoprolol to 75 mg twice daily Avoid triggers caffeine, chocolate, EtOH, dehydration etc.  Coronary artery disease-denies recent episodes of arm neck back or chest discomfort.  Status post CABG in 2000. Continue metoprolol, aspirin, Plavix, rosuvastatin  Hyperlipidemia-LDL 76 on 12/02/22. High-fiber diet Continue aspirin, rosuvastatin  Chronic systolic CHF-EF pre-CABG 45-50%.  Subsequent TEE showed LVEF of 25-30%.  Was noted to have moderately reduced RV function, moderate left atrial enlargement and mild right atrial enlargement, moderate mitral regurgitation was also noted.  Ischemic evaluation with Myoview in 2020 showed no ischemia and LVEF of 51%.  Previously discussed proceeding to repeat ischemic evaluation in the near future.  Unable to uptitrate GDMT due to blood pressure. Continue furosemide, metoprolol, spironolactone, Jardiance Heart healthy low-sodium diet Will plan for repeat echocardiogram once GDMT has been optimized and rhythm restored.  Disposition: Follow-up with Dr. Rennis Golden or me in 3 weeks   Thomasene Ripple. Yocelyn Brocious NP-C     03/16/2023, 3:00 PM [facility] [address] Office [phone] Fax [phone]    I spent 15 minutes examining this patient, reviewing medications, and using patient centered shared decision making involving their cardiac care.   I spent  20 minutes reviewing past medical history,  medications, and prior cardiac  tests.

## 2023-03-14 DIAGNOSIS — E1152 Type 2 diabetes mellitus with diabetic peripheral angiopathy with gangrene: Secondary | ICD-10-CM | POA: Diagnosis not present

## 2023-03-14 DIAGNOSIS — L02612 Cutaneous abscess of left foot: Secondary | ICD-10-CM | POA: Diagnosis not present

## 2023-03-14 DIAGNOSIS — L97521 Non-pressure chronic ulcer of other part of left foot limited to breakdown of skin: Secondary | ICD-10-CM | POA: Diagnosis not present

## 2023-03-14 DIAGNOSIS — Z48812 Encounter for surgical aftercare following surgery on the circulatory system: Secondary | ICD-10-CM | POA: Diagnosis not present

## 2023-03-14 DIAGNOSIS — I872 Venous insufficiency (chronic) (peripheral): Secondary | ICD-10-CM | POA: Diagnosis not present

## 2023-03-14 DIAGNOSIS — L03116 Cellulitis of left lower limb: Secondary | ICD-10-CM | POA: Diagnosis not present

## 2023-03-14 NOTE — Progress Notes (Signed)
 Actemra IV infusion appears to be scheduled for 03/17/2023

## 2023-03-14 NOTE — Addendum Note (Signed)
 Addended by: Murrell Redden on: 03/14/2023 08:25 AM   Modules accepted: Orders

## 2023-03-16 ENCOUNTER — Encounter: Payer: Self-pay | Admitting: General Practice

## 2023-03-16 ENCOUNTER — Ambulatory Visit: Payer: Medicare Other | Attending: General Practice | Admitting: General Practice

## 2023-03-16 VITALS — BP 115/58 | HR 104 | Ht 68.0 in | Wt 135.0 lb

## 2023-03-16 DIAGNOSIS — Z951 Presence of aortocoronary bypass graft: Secondary | ICD-10-CM | POA: Diagnosis not present

## 2023-03-16 DIAGNOSIS — L02612 Cutaneous abscess of left foot: Secondary | ICD-10-CM | POA: Diagnosis not present

## 2023-03-16 DIAGNOSIS — I2510 Atherosclerotic heart disease of native coronary artery without angina pectoris: Secondary | ICD-10-CM

## 2023-03-16 DIAGNOSIS — I10 Essential (primary) hypertension: Secondary | ICD-10-CM

## 2023-03-16 DIAGNOSIS — L03116 Cellulitis of left lower limb: Secondary | ICD-10-CM | POA: Diagnosis not present

## 2023-03-16 DIAGNOSIS — I872 Venous insufficiency (chronic) (peripheral): Secondary | ICD-10-CM | POA: Diagnosis not present

## 2023-03-16 DIAGNOSIS — I4819 Other persistent atrial fibrillation: Secondary | ICD-10-CM | POA: Diagnosis not present

## 2023-03-16 DIAGNOSIS — E782 Mixed hyperlipidemia: Secondary | ICD-10-CM

## 2023-03-16 DIAGNOSIS — L97521 Non-pressure chronic ulcer of other part of left foot limited to breakdown of skin: Secondary | ICD-10-CM | POA: Diagnosis not present

## 2023-03-16 DIAGNOSIS — Z48812 Encounter for surgical aftercare following surgery on the circulatory system: Secondary | ICD-10-CM | POA: Diagnosis not present

## 2023-03-16 DIAGNOSIS — E1152 Type 2 diabetes mellitus with diabetic peripheral angiopathy with gangrene: Secondary | ICD-10-CM | POA: Diagnosis not present

## 2023-03-16 MED ORDER — AMIODARONE HCL 200 MG PO TABS: ORAL_TABLET | ORAL | 3 refills | Status: DC

## 2023-03-16 MED ORDER — METOPROLOL TARTRATE 75 MG PO TABS: 75.0000 mg | ORAL_TABLET | Freq: Two times a day (BID) | ORAL | 3 refills | Status: DC

## 2023-03-16 NOTE — Addendum Note (Signed)
 Addended by: Alyson Ingles on: 03/16/2023 03:56 PM   Modules accepted: Orders

## 2023-03-16 NOTE — Patient Instructions (Signed)
 Medication Instructions:  MAKE SURE TO TAKE YOUR ELIQUIS TWICE DAILY INCREASE METOPROLOL 75MG  TWICE DAILY TAKE YOU AMIODARONE 200MG  DAILY *If you need a refill on your cardiac medications before your next appointment, please call your pharmacy*  Lab Work: NONE  Testing/Procedures: NONE  Follow-Up: At [facility], you and your health needs are our priority.  As part of our continuing mission to provide you with exceptional heart care, we have created designated Provider Care Teams.  These Care Teams include your primary Cardiologist (physician) and Advanced Practice Providers (APPs -  Physician Assistants and Nurse Practitioners) who all work together to provide you with the care you need, when you need it.  Your next appointment:   04-07-2023 @ 220PM   Provider:   Edd Fabian, FNP-C

## 2023-03-17 ENCOUNTER — Ambulatory Visit: Payer: Medicare Other

## 2023-03-17 VITALS — BP 114/67 | HR 104 | Temp 98.3°F | Resp 16 | Ht 68.0 in | Wt 138.0 lb

## 2023-03-17 DIAGNOSIS — M316 Other giant cell arteritis: Secondary | ICD-10-CM

## 2023-03-17 DIAGNOSIS — Z79899 Other long term (current) drug therapy: Secondary | ICD-10-CM

## 2023-03-17 MED ORDER — SODIUM CHLORIDE 0.9 % IV SOLN
6.0000 mg/kg | Freq: Once | INTRAVENOUS | Status: AC
  Administered 2023-03-17: 368 mg via INTRAVENOUS
  Filled 2023-03-17: qty 18.4

## 2023-03-17 MED ORDER — DIPHENHYDRAMINE HCL 25 MG PO CAPS
25.0000 mg | ORAL_CAPSULE | Freq: Once | ORAL | Status: AC
  Administered 2023-03-17: 25 mg via ORAL
  Filled 2023-03-17: qty 1

## 2023-03-17 MED ORDER — ACETAMINOPHEN 325 MG PO TABS
650.0000 mg | ORAL_TABLET | Freq: Once | ORAL | Status: AC
  Administered 2023-03-17: 650 mg via ORAL
  Filled 2023-03-17: qty 2

## 2023-03-17 NOTE — Progress Notes (Signed)
 HISTORY AND PHYSICAL     CC:  follow up. Requesting Provider:  [name], MD  HPI: This is a 83 y.o. male who is here today for follow up for PAD. He underwent angiogram with left SFA stenting on 02/14/2023 by Dr. Hetty Blend.   He did develop a right femoral artery psa and underwent thrombin injection of the right femoral psa on 02/25/2023 by Dr. Hetty Blend.   Pt has hx of HTN, CKD II, atrial fibrillation on Eliquis, CHF, CAD s/p CABG, Type II DM, and HLD who presented with left foot infection.   Vascular surgery was consulted on 02/11/2023 in the hospital due to non healing wound and abnormal ABI. He explains that he was hospitalized back in August for what sounds like management of GCA and was placed on a long course of steroids. He says following the course of steroids he had a lot of lower extremity edema and this subsequently turned into a wound on the dorsum of his left foot. He says his wound has been present for about 1 month now. He says it has not really worsened but that it has not been healing. He explains that he walks very little since the wound appeared. He has been using cane and walking on his heel. He says prior to the wound he was walking without much difficulty. He denies any real pain in his legs on rest or ambulation. He lives with his wife and says he is able to perform his ADL's independently. 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 He is a former smoker. His ABIs on admission show non compressible vessels bilaterally with absent toe pressure on the left and 0.51 on the right.    The pt returns today for follow up and here with his wife [name].  He states his left foot is not doing too well.  He is having pain in the foot that wakes him at night. He says that he wiggles his foot around to try to make it feel better.  He states his right groin feels much better since the thrombin injection.  Pt is on Eliquis for afib and has what sounds  like an ablation scheduled in a few weeks.  Upon review of chart, his Eliquis was first prescribed in September 2024.   The pt is on a statin for cholesterol management.    The pt is on an aspirin.    Other AC:  Eliquis/Plavix The pt is on BB, diuretic for hypertension.  The pt is  on medication for diabetes. Tobacco hx:  former   Past Medical History:  Diagnosis Date   Acute gastric ulcer with hemorrhage 05/06/2013   Arthritis    Asthma    Atrial enlargement, left    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    History of MI (myocardial infarction)    History of nuclear stress test 08/07/2010   dipyridamole; EKG negative for ischemia, low risk scan    Hyperlipidemia    Hyperplastic colon polyp 05/2008   Hypertension    Ischemic cardiomyopathy  EF 45%, with inferior wall motion abnormality    Myocardial infarction (HCC)    Valvular regurgitation    mitral and tricuspid (mild)    Past Surgical History:  Procedure Laterality Date   ABDOMINAL AORTOGRAM W/LOWER EXTREMITY N/A 02/14/2023   Procedure: ABDOMINAL AORTOGRAM W/LOWER EXTREMITY;  Surgeon: Daria Pastures, MD;  Location: [facility] INVASIVE CV LAB;  Service: Cardiovascular;  Laterality: N/A;   CARDIOVERSION N/A 08/16/2022   Procedure: CARDIOVERSION;  Surgeon: Jake Bathe, MD;  Location: [facility] INVASIVE CV LAB;  Service: Cardiovascular;  Laterality: N/A;   CORONARY ARTERY BYPASS GRAFT  2000   LIMA to LAD, free IMA to OM2, sequential graft to PLA & PLD   ESOPHAGOGASTRODUODENOSCOPY N/A 05/07/2013   Procedure: ESOPHAGOGASTRODUODENOSCOPY (EGD);  Surgeon: Iva Boop, MD;  Location: [facility] ENDOSCOPY;  Service: Endoscopy;  Laterality: N/A;    PERIPHERAL VASCULAR INTERVENTION Left 02/14/2023   Procedure: PERIPHERAL VASCULAR INTERVENTION;  Surgeon: Daria Pastures, MD;  Location: [facility] INVASIVE CV LAB;  Service: Cardiovascular;  Laterality: Left;  SFA   TEE WITHOUT CARDIOVERSION N/A 08/16/2022   Procedure: TRANSESOPHAGEAL ECHOCARDIOGRAM;  Surgeon: Jake Bathe, MD;  Location: [facility] INVASIVE CV LAB;  Service: Cardiovascular;  Laterality: N/A;   TRANSTHORACIC ECHOCARDIOGRAM  09/07/2010   EF 45-50%; LV systolic function mildly reduced; LA mildly dilated; mild-mod MR & mild-mod TR; aortic root sclerosis/calcification;     No Known Allergies  See chart for list of current medications.  Family History  Problem Relation Age of Onset   Stroke Father    Hypertension Father    Diabetes Brother    Esophageal cancer Brother    Stroke Maternal Grandmother    Stroke Maternal Grandfather    Stomach cancer Maternal Aunt    Colon cancer Neg Hx    Rectal cancer Neg Hx     Social History   Socioeconomic History   Marital status: Married    Spouse name: Not on file   Number of children: 1   Years of education: Not on file   Highest education level: Not on file  Occupational History   Not on file  Tobacco Use   Smoking status: Former    Current packs/day: 0.00    Types: Cigarettes    Quit date: 11/04/1985    Years since quitting: 37.3   Smokeless tobacco: Never  Vaping Use   Vaping status: Never Used  Substance and Sexual Activity   Alcohol use: Not Currently    Alcohol/week: 2.0 - 3.0 standard drinks of alcohol    Types: 2 - 3 Cans of beer per week   Drug use: No   Sexual activity: Not on file  Other Topics Concern   Not on file  Social History Narrative   Not on file   Social Drivers of Health   Financial Resource Strain: Low Risk  (08/24/2022)   Overall Financial Resource Strain (CARDIA)    Difficulty of Paying Living Expenses: Not hard at all  Food Insecurity: No Food Insecurity (02/16/2023)   Hunger Vital Sign    Worried  About Running Out of Food in the Last Year: Never true    Ran Out of Food in the Last Year: Never true  Transportation Needs: No Transportation Needs (02/16/2023)   PRAPARE - Administrator, Civil Service (Medical): No    Lack of Transportation (Non-Medical): No  Physical Activity: Sufficiently Active (07/10/2021)   Exercise Vital Sign    Days of Exercise per Week: 5 days  Minutes of Exercise per Session: 30 min  Stress: No Stress Concern Present (08/24/2022)   [employer] of Occupational Health - Occupational Stress Questionnaire    Feeling of Stress : Only a little  Social Connections: Moderately Integrated (02/09/2023)   Social Connection and Isolation Panel [NHANES]    Frequency of Communication with Friends and Family: Twice a week    Frequency of Social Gatherings with Friends and Family: Twice a week    Attends Religious Services: More than 4 times per year    Active Member of [employer] or Organizations: No    Attends Banker Meetings: Never    Marital Status: Married  Catering manager Violence: Not At Risk (02/16/2023)   Humiliation, Afraid, Rape, and Kick questionnaire    Fear of Current or Ex-Partner: No    Emotionally Abused: No    Physically Abused: No    Sexually Abused: No     REVIEW OF SYSTEMS:   [X]  denotes positive finding, [ ]  denotes negative finding Cardiac  Comments:  Chest pain or chest pressure:    Shortness of breath upon exertion:    Short of breath when lying flat:    Irregular heart rhythm:        Vascular    Pain in calf, thigh, or hip brought on by ambulation:    Pain in feet at night that wakes you up from your sleep:  x   Blood clot in your veins:    Leg swelling:         Pulmonary    Oxygen at home:    Productive cough:     Wheezing:         Neurologic    Sudden weakness in arms or legs:     Sudden numbness in arms or legs:     Sudden onset of difficulty speaking or slurred speech:    Temporary loss of vision  in one eye:     Problems with dizziness:         Gastrointestinal    Blood in stool:     Vomited blood:         Genitourinary    Burning when urinating:     Blood in urine:        Psychiatric    Major depression:         Hematologic    Bleeding problems:    Problems with blood clotting too easily:        Skin    Rashes or ulcers: x       Constitutional    Fever or chills:      PHYSICAL EXAMINATION:  Today's Vitals   03/18/23 0927  BP: 113/69  Pulse: (!) 102  Resp: 18  Temp: (!) 97.3 F (36.3 C)  TempSrc: Temporal  SpO2: 99%  Weight: 135 lb 14.4 oz (61.6 kg)  Height: 5\' 8"  (1.727 m)  PainSc: 7    Body mass index is 20.66 kg/m.   General:  WDWN in NAD; vital signs documented above Gait: Not observed HENT: WNL, normocephalic Pulmonary: normal non-labored breathing , without wheezing Cardiac: irregular HR, without carotid bruits Skin: without rashes; ecchymosis present on arms and right thigh Vascular Exam/Pulses: Bilateral femoral pulses are palpable Left DP/PT/pero are all brisk multiphasic  Extremities: wound on dorsum of the foot has healed.  He now has what appears to be infection of the left 5th toe and possibly the 4th.  The left great toe is erythematous.  Musculoskeletal: no muscle wasting or atrophy  Neurologic: A&O X 3 Psychiatric:  The pt has Normal affect.   Non-Invasive Vascular Imaging:   ABI's/TBI's on 03/18/2023: Right:  [place]/0.65 - Great toe pressure: 87 Left:  [place]/0.51 - Great toe pressure: 68  Arterial duplex on 03/18/2023: +----------+--------+-----+--------+--------+--------+  LEFT     PSV cm/sRatioStenosisWaveformComments  +----------+--------+-----+--------+--------+--------+  CFA Distal81                   biphasic          +----------+--------+-----+--------+--------+--------+  SFA Prox  61                   biphasic          +----------+--------+-----+--------+--------+--------+  SFA Mid   69                    biphasic          +----------+--------+-----+--------+--------+--------+  POP Prox  93                   biphasic          +----------+--------+-----+--------+--------+--------+     Left Stent(s):  +---------------+--------+--------+----------+--------+  distal SFA     PSV cm/sStenosisWaveform  Comments  +---------------+--------+--------+----------+--------+  Prox to Stent  79              biphasic            +---------------+--------+--------+----------+--------+  Proximal Stent 76              monophasic          +---------------+--------+--------+----------+--------+  Mid Stent      54              biphasic            +---------------+--------+--------+----------+--------+  Distal Stent   96              triphasic           +---------------+--------+--------+----------+--------+  Distal to Stent78              biphasic            +---------------+--------+--------+----------+--------+   Summary:  Left: Patent distal superficial femoral artery stent.   Previous ABI's/TBI's on 02/10/2023: Right:  [place]/0.51 - Great toe pressure: 58 Left:  [place]/0 - Great toe pressure:  0  Previous arterial duplex on 02/12/2023: +-----------+--------+-----+---------------+-------------------+--------+  LEFT      PSV cm/sRatioStenosis       Waveform           Comments  +-----------+--------+-----+---------------+-------------------+--------+  CFA Prox   87                          multiphasic                  +-----------+--------+-----+---------------+-------------------+--------+  CFA Distal 89                          multiphasic                  +-----------+--------+-----+---------------+-------------------+--------+  DFA       152          30-49% stenosis                             +-----------+--------+-----+---------------+-------------------+--------+  SFA Prox   124  multiphasic                  +-----------+--------+-----+---------------+-------------------+--------+  SFA Mid    204          50-74% stenosismonophasic                   +-----------+--------+-----+---------------+-------------------+--------+  SFA Distal 407          75-99% stenosismonophasic                   +-----------+--------+-----+---------------+-------------------+--------+  POP Prox   66                          monophasic                   +-----------+--------+-----+---------------+-------------------+--------+  POP Mid    58                          monophasic                   +-----------+--------+-----+---------------+-------------------+--------+  POP Distal 103          30-49% stenosismonophasic                   +-----------+--------+-----+---------------+-------------------+--------+  ATA Prox   61                          monophasic                   +-----------+--------+-----+---------------+-------------------+--------+  ATA Mid    68                          monophasic                   +-----------+--------+-----+---------------+-------------------+--------+  ATA Distal 111          30-49% stenosismonophasic                   +-----------+--------+-----+---------------+-------------------+--------+  PTA Prox   26                          dampened monophasic          +-----------+--------+-----+---------------+-------------------+--------+  PTA Mid    25                          dampened monophasic          +-----------+--------+-----+---------------+-------------------+--------+  PTA Distal 172          30-49% stenosismonophasic                   +-----------+--------+-----+---------------+-------------------+--------+  PERO Prox  88                          monophasic                   +-----------+--------+-----+---------------+-------------------+--------+  PERO Mid   62                           monophasic                   +-----------+--------+-----+---------------+-------------------+--------+  PERO Distal66  monophasic                   +-----------+--------+-----+---------------+-------------------+--------+   Summary:  Left: 30-49% stenosis noted in the deep femoral artery. 50-74% stenosis  noted in the superficial femoral artery. 75-99% stenosis noted in the  superficial femoral artery and/or popliteal artery. 30-49% stenosis noted  in the popliteal artery. 30-49%  stenosis noted in the anterior tibial artery. 30-49% stenosis noted in the  posterior tibial artery. Diffuse calcific plaque noted throughout the left  lower extremity arteries.      ASSESSMENT/PLAN:: 83 y.o. male here for follow up for PAD with hx of angiogram with left SFA stenting on 02/14/2023 by Dr. Hetty Blend.   He did develop a right femoral artery psa and underwent thrombin injection of the right femoral psa on 02/25/2023 by Dr. Hetty Blend.   -pt's wound on the dorsum of the left foot has completely healed.  He has brisk multiphasic doppler flow left foot and is toe pressure went from absent to 68.  Unfortunately, he has what appears to be left 5th possibly 4th toe infection.  Pt seen with Dr. Hetty Blend.  Pt will be put on Augmentin bid x 7 days and this was sent to [facility].  He will be scheduled for left 5th toe amputation and possible left 4th amputation on 03/22/2023 by Dr. Hetty Blend.  While he is in pre-op, we will get a left foot xray. -he will need to stop his Eliquis on 03/20/2023 for surgery on 3/11.  Most likely will have a one night stay in the hospital and restart Eliquis the next day.  Dr. Hetty Blend discussed that he most likely will need to leave the wound open given infection.  Pt and wife expressed understanding.  -he is encouraged to wash his foot daily with soap and water and place dry gauze between his toes after cleansing.       Doreatha Massed, [facility] [phone]  Clinic MD:  pt seen with Dr. Hetty Blend

## 2023-03-17 NOTE — H&P (View-Only) (Signed)
 HISTORY AND PHYSICAL     CC:  follow up. Requesting Provider:  [name], MD  HPI: This is a 83 y.o. male who is here today for follow up for PAD. He underwent angiogram with left SFA stenting on 02/14/2023 by Dr. Hetty Blend.   He did develop a right femoral artery psa and underwent thrombin injection of the right femoral psa on 02/25/2023 by Dr. Hetty Blend.   Pt has hx of HTN, CKD II, atrial fibrillation on Eliquis, CHF, CAD s/p CABG, Type II DM, and HLD who presented with left foot infection.   Vascular surgery was consulted on 02/11/2023 in the hospital due to non healing wound and abnormal ABI. He explains that he was hospitalized back in August for what sounds like management of GCA and was placed on a long course of steroids. He says following the course of steroids he had a lot of lower extremity edema and this subsequently turned into a wound on the dorsum of his left foot. He says his wound has been present for about 1 month now. He says it has not really worsened but that it has not been healing. He explains that he walks very little since the wound appeared. He has been using cane and walking on his heel. He says prior to the wound he was walking without much difficulty. He denies any real pain in his legs on rest or ambulation. He lives with his wife and says he is able to perform his ADL's independently. 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 He is a former smoker. His ABIs on admission show non compressible vessels bilaterally with absent toe pressure on the left and 0.51 on the right.    The pt returns today for follow up and here with his wife [name].  He states his left foot is not doing too well.  He is having pain in the foot that wakes him at night. He says that he wiggles his foot around to try to make it feel better.  He states his right groin feels much better since the thrombin injection.  Pt is on Eliquis for afib and has what sounds  like an ablation scheduled in a few weeks.  Upon review of chart, his Eliquis was first prescribed in September 2024.   The pt is on a statin for cholesterol management.    The pt is on an aspirin.    Other AC:  Eliquis/Plavix The pt is on BB, diuretic for hypertension.  The pt is  on medication for diabetes. Tobacco hx:  former   Past Medical History:  Diagnosis Date   Acute gastric ulcer with hemorrhage 05/06/2013   Arthritis    Asthma    Atrial enlargement, left    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    History of MI (myocardial infarction)    History of nuclear stress test 08/07/2010   dipyridamole; EKG negative for ischemia, low risk scan    Hyperlipidemia    Hyperplastic colon polyp 05/2008   Hypertension    Ischemic cardiomyopathy  EF 45%, with inferior wall motion abnormality    Myocardial infarction (HCC)    Valvular regurgitation    mitral and tricuspid (mild)    Past Surgical History:  Procedure Laterality Date   ABDOMINAL AORTOGRAM W/LOWER EXTREMITY N/A 02/14/2023   Procedure: ABDOMINAL AORTOGRAM W/LOWER EXTREMITY;  Surgeon: Daria Pastures, MD;  Location: [facility] INVASIVE CV LAB;  Service: Cardiovascular;  Laterality: N/A;   CARDIOVERSION N/A 08/16/2022   Procedure: CARDIOVERSION;  Surgeon: Jake Bathe, MD;  Location: [facility] INVASIVE CV LAB;  Service: Cardiovascular;  Laterality: N/A;   CORONARY ARTERY BYPASS GRAFT  2000   LIMA to LAD, free IMA to OM2, sequential graft to PLA & PLD   ESOPHAGOGASTRODUODENOSCOPY N/A 05/07/2013   Procedure: ESOPHAGOGASTRODUODENOSCOPY (EGD);  Surgeon: Iva Boop, MD;  Location: [facility] ENDOSCOPY;  Service: Endoscopy;  Laterality: N/A;    PERIPHERAL VASCULAR INTERVENTION Left 02/14/2023   Procedure: PERIPHERAL VASCULAR INTERVENTION;  Surgeon: Daria Pastures, MD;  Location: [facility] INVASIVE CV LAB;  Service: Cardiovascular;  Laterality: Left;  SFA   TEE WITHOUT CARDIOVERSION N/A 08/16/2022   Procedure: TRANSESOPHAGEAL ECHOCARDIOGRAM;  Surgeon: Jake Bathe, MD;  Location: [facility] INVASIVE CV LAB;  Service: Cardiovascular;  Laterality: N/A;   TRANSTHORACIC ECHOCARDIOGRAM  09/07/2010   EF 45-50%; LV systolic function mildly reduced; LA mildly dilated; mild-mod MR & mild-mod TR; aortic root sclerosis/calcification;     No Known Allergies  See chart for list of current medications.  Family History  Problem Relation Age of Onset   Stroke Father    Hypertension Father    Diabetes Brother    Esophageal cancer Brother    Stroke Maternal Grandmother    Stroke Maternal Grandfather    Stomach cancer Maternal Aunt    Colon cancer Neg Hx    Rectal cancer Neg Hx     Social History   Socioeconomic History   Marital status: Married    Spouse name: Not on file   Number of children: 1   Years of education: Not on file   Highest education level: Not on file  Occupational History   Not on file  Tobacco Use   Smoking status: Former    Current packs/day: 0.00    Types: Cigarettes    Quit date: 11/04/1985    Years since quitting: 37.3   Smokeless tobacco: Never  Vaping Use   Vaping status: Never Used  Substance and Sexual Activity   Alcohol use: Not Currently    Alcohol/week: 2.0 - 3.0 standard drinks of alcohol    Types: 2 - 3 Cans of beer per week   Drug use: No   Sexual activity: Not on file  Other Topics Concern   Not on file  Social History Narrative   Not on file   Social Drivers of Health   Financial Resource Strain: Low Risk  (08/24/2022)   Overall Financial Resource Strain (CARDIA)    Difficulty of Paying Living Expenses: Not hard at all  Food Insecurity: No Food Insecurity (02/16/2023)   Hunger Vital Sign    Worried  About Running Out of Food in the Last Year: Never true    Ran Out of Food in the Last Year: Never true  Transportation Needs: No Transportation Needs (02/16/2023)   PRAPARE - Administrator, Civil Service (Medical): No    Lack of Transportation (Non-Medical): No  Physical Activity: Sufficiently Active (07/10/2021)   Exercise Vital Sign    Days of Exercise per Week: 5 days  Minutes of Exercise per Session: 30 min  Stress: No Stress Concern Present (08/24/2022)   [employer] of Occupational Health - Occupational Stress Questionnaire    Feeling of Stress : Only a little  Social Connections: Moderately Integrated (02/09/2023)   Social Connection and Isolation Panel [NHANES]    Frequency of Communication with Friends and Family: Twice a week    Frequency of Social Gatherings with Friends and Family: Twice a week    Attends Religious Services: More than 4 times per year    Active Member of [employer] or Organizations: No    Attends Banker Meetings: Never    Marital Status: Married  Catering manager Violence: Not At Risk (02/16/2023)   Humiliation, Afraid, Rape, and Kick questionnaire    Fear of Current or Ex-Partner: No    Emotionally Abused: No    Physically Abused: No    Sexually Abused: No     REVIEW OF SYSTEMS:   [X]  denotes positive finding, [ ]  denotes negative finding Cardiac  Comments:  Chest pain or chest pressure:    Shortness of breath upon exertion:    Short of breath when lying flat:    Irregular heart rhythm:        Vascular    Pain in calf, thigh, or hip brought on by ambulation:    Pain in feet at night that wakes you up from your sleep:  x   Blood clot in your veins:    Leg swelling:         Pulmonary    Oxygen at home:    Productive cough:     Wheezing:         Neurologic    Sudden weakness in arms or legs:     Sudden numbness in arms or legs:     Sudden onset of difficulty speaking or slurred speech:    Temporary loss of vision  in one eye:     Problems with dizziness:         Gastrointestinal    Blood in stool:     Vomited blood:         Genitourinary    Burning when urinating:     Blood in urine:        Psychiatric    Major depression:         Hematologic    Bleeding problems:    Problems with blood clotting too easily:        Skin    Rashes or ulcers: x       Constitutional    Fever or chills:      PHYSICAL EXAMINATION:  Today's Vitals   03/18/23 0927  BP: 113/69  Pulse: (!) 102  Resp: 18  Temp: (!) 97.3 F (36.3 C)  TempSrc: Temporal  SpO2: 99%  Weight: 135 lb 14.4 oz (61.6 kg)  Height: 5\' 8"  (1.727 m)  PainSc: 7    Body mass index is 20.66 kg/m.   General:  WDWN in NAD; vital signs documented above Gait: Not observed HENT: WNL, normocephalic Pulmonary: normal non-labored breathing , without wheezing Cardiac: irregular HR, without carotid bruits Skin: without rashes; ecchymosis present on arms and right thigh Vascular Exam/Pulses: Bilateral femoral pulses are palpable Left DP/PT/pero are all brisk multiphasic  Extremities: wound on dorsum of the foot has healed.  He now has what appears to be infection of the left 5th toe and possibly the 4th.  The left great toe is erythematous.  Musculoskeletal: no muscle wasting or atrophy  Neurologic: A&O X 3 Psychiatric:  The pt has Normal affect.   Non-Invasive Vascular Imaging:   ABI's/TBI's on 03/18/2023: Right:  [place]/0.65 - Great toe pressure: 87 Left:  [place]/0.51 - Great toe pressure: 68  Arterial duplex on 03/18/2023: +----------+--------+-----+--------+--------+--------+  LEFT     PSV cm/sRatioStenosisWaveformComments  +----------+--------+-----+--------+--------+--------+  CFA Distal81                   biphasic          +----------+--------+-----+--------+--------+--------+  SFA Prox  61                   biphasic          +----------+--------+-----+--------+--------+--------+  SFA Mid   69                    biphasic          +----------+--------+-----+--------+--------+--------+  POP Prox  93                   biphasic          +----------+--------+-----+--------+--------+--------+     Left Stent(s):  +---------------+--------+--------+----------+--------+  distal SFA     PSV cm/sStenosisWaveform  Comments  +---------------+--------+--------+----------+--------+  Prox to Stent  79              biphasic            +---------------+--------+--------+----------+--------+  Proximal Stent 76              monophasic          +---------------+--------+--------+----------+--------+  Mid Stent      54              biphasic            +---------------+--------+--------+----------+--------+  Distal Stent   96              triphasic           +---------------+--------+--------+----------+--------+  Distal to Stent78              biphasic            +---------------+--------+--------+----------+--------+   Summary:  Left: Patent distal superficial femoral artery stent.   Previous ABI's/TBI's on 02/10/2023: Right:  [place]/0.51 - Great toe pressure: 58 Left:  [place]/0 - Great toe pressure:  0  Previous arterial duplex on 02/12/2023: +-----------+--------+-----+---------------+-------------------+--------+  LEFT      PSV cm/sRatioStenosis       Waveform           Comments  +-----------+--------+-----+---------------+-------------------+--------+  CFA Prox   87                          multiphasic                  +-----------+--------+-----+---------------+-------------------+--------+  CFA Distal 89                          multiphasic                  +-----------+--------+-----+---------------+-------------------+--------+  DFA       152          30-49% stenosis                             +-----------+--------+-----+---------------+-------------------+--------+  SFA Prox   124  multiphasic                  +-----------+--------+-----+---------------+-------------------+--------+  SFA Mid    204          50-74% stenosismonophasic                   +-----------+--------+-----+---------------+-------------------+--------+  SFA Distal 407          75-99% stenosismonophasic                   +-----------+--------+-----+---------------+-------------------+--------+  POP Prox   66                          monophasic                   +-----------+--------+-----+---------------+-------------------+--------+  POP Mid    58                          monophasic                   +-----------+--------+-----+---------------+-------------------+--------+  POP Distal 103          30-49% stenosismonophasic                   +-----------+--------+-----+---------------+-------------------+--------+  ATA Prox   61                          monophasic                   +-----------+--------+-----+---------------+-------------------+--------+  ATA Mid    68                          monophasic                   +-----------+--------+-----+---------------+-------------------+--------+  ATA Distal 111          30-49% stenosismonophasic                   +-----------+--------+-----+---------------+-------------------+--------+  PTA Prox   26                          dampened monophasic          +-----------+--------+-----+---------------+-------------------+--------+  PTA Mid    25                          dampened monophasic          +-----------+--------+-----+---------------+-------------------+--------+  PTA Distal 172          30-49% stenosismonophasic                   +-----------+--------+-----+---------------+-------------------+--------+  PERO Prox  88                          monophasic                   +-----------+--------+-----+---------------+-------------------+--------+  PERO Mid   62                           monophasic                   +-----------+--------+-----+---------------+-------------------+--------+  PERO Distal66  monophasic                   +-----------+--------+-----+---------------+-------------------+--------+   Summary:  Left: 30-49% stenosis noted in the deep femoral artery. 50-74% stenosis  noted in the superficial femoral artery. 75-99% stenosis noted in the  superficial femoral artery and/or popliteal artery. 30-49% stenosis noted  in the popliteal artery. 30-49%  stenosis noted in the anterior tibial artery. 30-49% stenosis noted in the  posterior tibial artery. Diffuse calcific plaque noted throughout the left  lower extremity arteries.      ASSESSMENT/PLAN:: 83 y.o. male here for follow up for PAD with hx of angiogram with left SFA stenting on 02/14/2023 by Dr. Hetty Blend.   He did develop a right femoral artery psa and underwent thrombin injection of the right femoral psa on 02/25/2023 by Dr. Hetty Blend.   -pt's wound on the dorsum of the left foot has completely healed.  He has brisk multiphasic doppler flow left foot and is toe pressure went from absent to 68.  Unfortunately, he has what appears to be left 5th possibly 4th toe infection.  Pt seen with Dr. Hetty Blend.  Pt will be put on Augmentin bid x 7 days and this was sent to [facility].  He will be scheduled for left 5th toe amputation and possible left 4th amputation on 03/22/2023 by Dr. Hetty Blend.  While he is in pre-op, we will get a left foot xray. -he will need to stop his Eliquis on 03/20/2023 for surgery on 3/11.  Most likely will have a one night stay in the hospital and restart Eliquis the next day.  Dr. Hetty Blend discussed that he most likely will need to leave the wound open given infection.  Pt and wife expressed understanding.  -he is encouraged to wash his foot daily with soap and water and place dry gauze between his toes after cleansing.       Doreatha Massed, [facility] [phone]  Clinic MD:  pt seen with Dr. Hetty Blend

## 2023-03-17 NOTE — Progress Notes (Signed)
 Diagnosis: Temporal Areritis  Provider:  Chilton Greathouse MD  Procedure: IV Infusion  IV Type: Peripheral, IV Location: R Antecubital  Actemra (Tocilizumab), Dose: 368mg   Infusion Start Time: 1346  Infusion Stop Time: 1452  Post Infusion IV Care: Peripheral IV Discontinued  Discharge: Condition: Good, Destination: Home . AVS Declined  Performed by:  Adriana Mccallum, RN

## 2023-03-18 ENCOUNTER — Ambulatory Visit (HOSPITAL_COMMUNITY)
Admission: RE | Admit: 2023-03-18 | Discharge: 2023-03-18 | Disposition: A | Discharge status: Home / Self Care | Payer: Medicare Other | Source: Ambulatory Visit | Attending: Vascular Surgery | Admitting: Vascular Surgery

## 2023-03-18 ENCOUNTER — Other Ambulatory Visit (HOSPITAL_COMMUNITY): Payer: Self-pay

## 2023-03-18 ENCOUNTER — Telehealth: Payer: Self-pay | Admitting: Pharmacy Technician

## 2023-03-18 ENCOUNTER — Ambulatory Visit (INDEPENDENT_AMBULATORY_CARE_PROVIDER_SITE_OTHER)
Admit: 2023-03-18 | Discharge: 2023-03-18 | Disposition: A | Discharge status: Home / Self Care | Payer: Medicare Other | Attending: Vascular Surgery | Admitting: Vascular Surgery

## 2023-03-18 ENCOUNTER — Encounter: Payer: Self-pay | Admitting: Internal Medicine

## 2023-03-18 ENCOUNTER — Ambulatory Visit (INDEPENDENT_AMBULATORY_CARE_PROVIDER_SITE_OTHER): Payer: Medicare Other | Admitting: Physician Assistant

## 2023-03-18 ENCOUNTER — Other Ambulatory Visit: Payer: Self-pay

## 2023-03-18 VITALS — BP 113/69 | HR 102 | Temp 97.3°F | Resp 18 | Ht 68.0 in | Wt 135.9 lb

## 2023-03-18 DIAGNOSIS — I739 Peripheral vascular disease, unspecified: Secondary | ICD-10-CM

## 2023-03-18 DIAGNOSIS — L089 Local infection of the skin and subcutaneous tissue, unspecified: Secondary | ICD-10-CM

## 2023-03-18 LAB — VAS US ABI WITH/WO TBI

## 2023-03-18 MED ORDER — AMOXICILLIN-POT CLAVULANATE 875-125 MG PO TABS: 1.0000 | ORAL_TABLET | Freq: Two times a day (BID) | ORAL | 0 refills | Status: DC

## 2023-03-18 NOTE — Telephone Encounter (Signed)
 Pharmacy Patient Advocate Encounter   Received notification from Fax that prior authorization for METOPROLOL 75MG  is required/requested.   Insurance verification completed.   The patient is insured through [facility] .   Per test claim: PA required; PA submitted to above mentioned insurance via CoverMyMeds Key/confirmation #/EOC [employer] Status is pending   PATIENT HAS TAKEN METOPROLOL 50MG  AT 75MG  BID IN THE PAST. I SENT FOR PA FOR 75MG  BID TO HELP WITH COMPLIANCE -INSTEAD OF CUTTING A PILL IN HALF-BUT INSURANCE MAY COME BACK SAYING PT NEEDS TO TAKE THE 50MG  TABLET LIKE IN THE PAST.

## 2023-03-21 ENCOUNTER — Other Ambulatory Visit: Payer: Self-pay

## 2023-03-21 ENCOUNTER — Encounter (HOSPITAL_COMMUNITY): Payer: Self-pay | Admitting: Vascular Surgery

## 2023-03-21 NOTE — Pre-Procedure Instructions (Signed)
 -------------  SDW INSTRUCTIONS given:  Your procedure is scheduled on 3/11.  Report to [facility] Main Entrance "A" at 05:30 A.M., and check in at the Admitting office.  Any questions or running late day of surgery: call [phone]    Remember:  Do not eat or drink after midnight the night before your surgery     Take these medicines the morning of surgery with A SIP OF WATER  Amiodarone Augmentin  Aspirin   Metoprolol Protonix  Prednisone crestor     May take these medicines IF NEEDED: norco PRN  As of today, STOP taking any Aleve, Naproxen, Ibuprofen, Motrin, Advil, Goody's, BC's, all herbal medications, fish oil, and all vitamins.  WHAT DO I DO ABOUT MY DIABETES MEDICATION?   Do not take Glipizide the morning of surgery.      Hold Jardiance for 3 days. Last dose 3/7  If your CBG is greater than 220 mg/dL, you may take  of your sliding scale (correction) dose of Insulin Aspart.   HOW TO MANAGE YOUR DIABETES BEFORE AND AFTER SURGERY  Why is it important to control my blood sugar before and after surgery? Improving blood sugar levels before and after surgery helps healing and can limit problems. A way of improving blood sugar control is eating a healthy diet by:  Eating less sugar and carbohydrates  Increasing activity/exercise  Talking with your doctor about reaching your blood sugar goals High blood sugars (greater than 180 mg/dL) can raise your risk of infections and slow your recovery, so you will need to focus on controlling your diabetes during the weeks before surgery. Make sure that the doctor who takes care of your diabetes knows about your planned surgery including the date and location.  How do I manage my blood sugar before surgery? Check your blood sugar at least 4 times a day, starting 2 days before surgery, to make sure that the level is not too high or low.  Check your blood sugar the morning of your surgery when you wake up and every 2 hours  until you get to the Short Stay unit.  If your blood sugar is less than 70 mg/dL, you will need to treat for low blood sugar: Do not take insulin. Treat a low blood sugar (less than 70 mg/dL) with  cup of clear juice (cranberry or apple), 4 glucose tablets, OR glucose gel. Recheck blood sugar in 15 minutes after treatment (to make sure it is greater than 70 mg/dL). If your blood sugar is not greater than 70 mg/dL on recheck, call [phone] for further instructions. Report your blood sugar to the short stay nurse when you get to Short Stay.  If you are admitted to the hospital after surgery: Your blood sugar will be checked by the staff and you will probably be given insulin after surgery (instead of oral diabetes medicines) to make sure you have good blood sugar levels. The goal for blood sugar control after surgery is 80-180 mg/dL.   Do NOT Smoke (Tobacco/Vaping) 24 hours prior to your procedure  If you use a CPAP at night, you may bring all equipment for your overnight stay.     You will be asked to remove any contacts, glasses, piercing's, hearing aid's, dentures/partials prior to surgery. Please bring cases for these items if needed.     Patients discharged the day of surgery will not be allowed to drive home, and someone needs to stay with them for 24 hours.  SURGICAL WAITING ROOM  VISITATION Patients may have no more than 2 support people in the waiting area - these visitors may rotate.   Pre-op nurse will coordinate an appropriate time for 1 ADULT support person, who may not rotate, to accompany patient in pre-op.  Children under the age of 55 must have an adult with them who is not the patient and must remain in the main waiting area with an adult.  If the patient needs to stay at the hospital during part of their recovery, the visitor guidelines for inpatient rooms apply.  Please refer to the [facility] website for the visitor guidelines for any additional  information.   Special instructions:   [place]- Preparing For Surgery   Please follow these instructions carefully.   Shower the NIGHT BEFORE SURGERY and the MORNING OF SURGERY with DIAL Soap.   Pat yourself dry with a CLEAN TOWEL.  Wear CLEAN PAJAMAS to bed the night before surgery  Place CLEAN SHEETS on your bed the night of your first shower and DO NOT SLEEP WITH PETS.   Additional instructions for the day of surgery: DO NOT APPLY any lotions, deodorants, cologne, or perfumes.   Do not wear jewelry or makeup Do not wear nail polish, gel polish, artificial nails, or any other type of covering on natural nails (fingers and toes) Do not bring valuables to the hospital. [facility] is not responsible for valuables/personal belongings. Put on clean/comfortable clothes.  Please brush your teeth.  Ask your nurse before applying any prescription medications to the skin.

## 2023-03-21 NOTE — Progress Notes (Signed)
 Anesthesia Chart Review:  83 year old male follows with cardiology for history of CAD s/p CABG 2000, HLD, ischemic myopathy (EF 49% in 2015, improved to 60 to 65% in 2021, however, after developing A-fib, most recent LVEF 25% by echo 11/2022), A-fib on Eliquis.  Last ischemic evaluation with Myoview in 2020 showed no ischemia.  Last seen by Edd Fabian, NP on 03/16/2023.  Noted to be in A-fib with rapid response 122 bpm at that time.  Metoprolol was increased to 75 mg twice daily.  All other cardiac medications continued.  He has been unable to uptitrate GDMT due to blood pressure.  He was recommended to follow-up in 3 weeks for recheck and consideration of DCCV.  Follows with vascular surgery for history of PAD.  Recently underwent angiogram with left SFA stenting 02/14/2023 by Dr. Hetty Blend, postoperatively he developed right femoral pseudoaneurysm and underwent thrombin injection 02/25/2023.  He was seen in follow-up by vascular surgery on 03/18/2023 and noted to have brisk Doppler flow, however, unfortunately he was also noted to have left fifth and possibly fourth toe infection.  He was started on Augmentin and recommended to proceed with fifth toe amputation and possible left fourth toe amputation.  Other pertinent history includes former smoker with associated COPD not on any daily inhaled medications (quit 1987), asthma, GERD, hiatal hernia, IDDM2, A1c 6.2 on 03/07/23.  Patient reports last dose of Eliquis on 03/18/2023.  CMP and CBC from 03/07/2023 reviewed, creatinine mildly elevated 1.24, WBC mildly elevated at 14.2, mild anemia with hemoglobin 10.4.  Patient will need day of surgery evaluation.  EKG 03/16/2023: Atrial fibrillation with rapid ventricular response with premature ventricular or aberrantly conducted complexes.  Rate 122. 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 Bifascicular block. Minimal voltage criteria for LVH, may be normal variant ( R in aVL ). Inferior infarct , age  undetermined. Anterolateral infarct , age undetermined  TTE 12/07/2022: 1. Left ventricular ejection fraction, by estimation, is 25%. The left  ventricle has severely decreased function. The left ventricle demonstrates  global hypokinesis. Left ventricular diastolic parameters are  indeterminate.   2. Right ventricular systolic function is moderately reduced. The right  ventricular size is normal.   3. Left atrial size was moderately dilated.   4. Right atrial size was mildly dilated.   5. Mitral valve was incompletely evaluated on this exam. MR is at least  moderate and eccentric, and may be underestimated. . The mitral valve is  abnormal. Moderate mitral valve regurgitation.   6. The aortic valve is grossly normal.     Zakariya, [facility] Short Stay Center/Anesthesiology Phone [phone] 03/21/2023 11:24 AM

## 2023-03-21 NOTE — Progress Notes (Addendum)
 PCP - Dr. Lynnea Ferrier Cardiologist - Dr. Zoila Shutter  PPM/ICD - denies   Chest x-ray - 11/24/18 EKG - 03/16/23 Stress Test - 07/18/18 ECHO - 12/07/22 Cardiac Cath - 11/13/1998  CPAP - denies  Fasting Blood Sugar - 150-160 Checks Blood Sugar twice/day  Pt states he was not told to hold his Jardiance. Last dose 3/9  Blood Thinner Instructions: Hold Eliquis 3 days. Last dose 3/7 Aspirin Instructions: continue  ERAS Protcol - NPO  COVID TEST- n/a  Anesthesia review: yes, cardiac hx  Patient verbally denies any shortness of breath, fever, cough and chest pain during phone call      Questions were answered. Patient verbalized understanding of instructions.

## 2023-03-21 NOTE — Anesthesia Preprocedure Evaluation (Signed)
 Anesthesia Evaluation  Patient identified by MRN, date of birth, ID band Patient awake    Reviewed: Allergy & Precautions, NPO status , Patient's Chart, lab work & pertinent test results  Airway Mallampati: III  TM Distance: >3 FB Neck ROM: Full    Dental no notable dental hx.    Pulmonary asthma , COPD, former smoker   Pulmonary exam normal        Cardiovascular hypertension, Pt. on medications and Pt. on home beta blockers + CAD, + Past MI, + CABG and +CHF  + dysrhythmias Atrial Fibrillation  Rhythm:Irregular Rate:Normal  TE 12/07/2022: 1. Left ventricular ejection fraction, by estimation, is 25%. The left  ventricle has severely decreased function. The left ventricle demonstrates  global hypokinesis. Left ventricular diastolic parameters are  indeterminate.   2. Right ventricular systolic function is moderately reduced. The right  ventricular size is normal.   3. Left atrial size was moderately dilated.   4. Right atrial size was mildly dilated.   5. Mitral valve was incompletely evaluated on this exam. MR is at least  moderate and eccentric, and may be underestimated. . The mitral valve is  abnormal. Moderate mitral valve regurgitation.   6. The aortic valve is grossly normal.     Neuro/Psych negative neurological ROS  negative psych ROS   GI/Hepatic Neg liver ROS, hiatal hernia, PUD,GERD  Medicated,,  Endo/Other  diabetes, Type 2, Insulin Dependent    Renal/GU   negative genitourinary   Musculoskeletal  (+) Arthritis , Osteoarthritis,    Abdominal Normal abdominal exam  (+)   Peds  Hematology  (+) Blood dyscrasia, anemia   Anesthesia Other Findings   Reproductive/Obstetrics                             Anesthesia Physical Anesthesia Plan  ASA: 4  Anesthesia Plan: MAC and Regional   Post-op Pain Management: Regional block*   Induction: Intravenous  PONV Risk Score and  Plan: 1 and Ondansetron, Dexamethasone and Treatment may vary due to age or medical condition  Airway Management Planned: Simple Face Mask and Nasal Cannula  Additional Equipment: None  Intra-op Plan:   Post-operative Plan:   Informed Consent: I have reviewed the patients History and Physical, chart, labs and discussed the procedure including the risks, benefits and alternatives for the proposed anesthesia with the patient or authorized representative who has indicated his/her understanding and acceptance.     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  Last ischemic evaluation with Myoview in 2020 showed no ischemia.  Last seen by Edd Fabian, NP on 03/16/2023.  Noted to be in A-fib with rapid response 122 bpm at that time.  Metoprolol was increased to 75 mg twice daily.  All other cardiac medications continued.  He has been unable to uptitrate GDMT due to blood pressure.  He was recommended to follow-up in 3 weeks for recheck and consideration of DCCV.  Follows with vascular surgery for history of PAD.  Recently underwent angiogram with left SFA stenting 02/14/2023 by Dr. Hetty Blend, postoperatively he developed right femoral pseudoaneurysm and underwent thrombin injection 02/25/2023.  He was seen in follow-up by vascular  surgery on 03/18/2023 and noted to have brisk Doppler flow, however, unfortunately he was also noted to have left fifth and possibly fourth toe infection.  He was started on Augmentin and recommended to proceed with fifth toe amputation and possible left fourth toe amputation.  Other pertinent history includes former smoker with associated COPD not on any daily inhaled medications (quit  1987), asthma, GERD, hiatal hernia, IDDM2, A1c 6.2 on 03/07/23.  Patient reports last dose of Eliquis on 03/18/2023.  CMP and CBC from 03/07/2023 reviewed, creatinine mildly elevated 1.24, WBC mildly elevated at 14.2, mild anemia with hemoglobin 10.4.  Patient will need day of surgery evaluation.  EKG 03/16/2023: Atrial fibrillation with rapid ventricular response with premature ventricular or aberrantly conducted complexes.  Rate 122. 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 Bifascicular block. Minimal voltage criteria for LVH, may be normal variant ( R in aVL ). Inferior infarct , age undetermined. Anterolateral infarct , age undetermined  TTE 12/07/2022: 1. Left ventricular ejection fraction, by estimation, is 25%. The left  ventricle has severely decreased function. The left ventricle demonstrates  global hypokinesis. Left ventricular diastolic parameters are  indeterminate.  2. Right ventricular systolic function is moderately reduced. The right  ventricular size is normal.  3. Left atrial size was moderately dilated.  4. Right atrial size was mildly dilated.  5. Mitral valve was incompletely evaluated on this exam. MR is at least  moderate and eccentric, and may be underestimated. . The mitral valve is  abnormal. Moderate mitral valve regurgitation.  6. The aortic valve is grossly normal.   )        Anesthesia Quick Evaluation

## 2023-03-21 NOTE — Telephone Encounter (Signed)
 Pharmacy Patient Advocate Encounter  Received notification from [facility] that Prior Authorization for metoprolol tartrate 75mg  has been APPROVED from 03/16/23 to until further notice  He just got 03/16/23 for 30 days on a transition fill   PA #/Case ID/Reference #:  [id number]

## 2023-03-22 ENCOUNTER — Inpatient Hospital Stay (HOSPITAL_COMMUNITY): Payer: Self-pay | Admitting: Physician Assistant

## 2023-03-22 ENCOUNTER — Other Ambulatory Visit (HOSPITAL_COMMUNITY): Payer: Self-pay

## 2023-03-22 ENCOUNTER — Encounter (HOSPITAL_COMMUNITY): Payer: Self-pay | Admitting: Vascular Surgery

## 2023-03-22 ENCOUNTER — Other Ambulatory Visit: Payer: Self-pay

## 2023-03-22 ENCOUNTER — Encounter (HOSPITAL_COMMUNITY)
Admission: RE | Disposition: A | Discharge status: Home / Self Care | Payer: Self-pay | Source: Home / Self Care | Attending: Vascular Surgery

## 2023-03-22 ENCOUNTER — Other Ambulatory Visit: Payer: Self-pay | Admitting: Family Medicine

## 2023-03-22 ENCOUNTER — Observation Stay (HOSPITAL_COMMUNITY)
Admission: EM | Admit: 2023-03-22 | Discharge: 2023-03-23 | Disposition: A | Discharge status: Home / Self Care | Attending: Vascular Surgery | Admitting: Vascular Surgery

## 2023-03-22 ENCOUNTER — Encounter (HOSPITAL_COMMUNITY): Payer: Self-pay | Admitting: *Deleted

## 2023-03-22 ENCOUNTER — Ambulatory Visit (HOSPITAL_BASED_OUTPATIENT_CLINIC_OR_DEPARTMENT_OTHER)
Admission: RE | Admit: 2023-03-22 | Discharge: 2023-03-22 | Disposition: A | Discharge status: Home / Self Care | Source: Home / Self Care | Attending: Vascular Surgery | Admitting: Vascular Surgery

## 2023-03-22 DIAGNOSIS — K219 Gastro-esophageal reflux disease without esophagitis: Secondary | ICD-10-CM | POA: Insufficient documentation

## 2023-03-22 DIAGNOSIS — R58 Hemorrhage, not elsewhere classified: Principal | ICD-10-CM | POA: Insufficient documentation

## 2023-03-22 DIAGNOSIS — Z951 Presence of aortocoronary bypass graft: Secondary | ICD-10-CM | POA: Insufficient documentation

## 2023-03-22 DIAGNOSIS — N182 Chronic kidney disease, stage 2 (mild): Secondary | ICD-10-CM | POA: Diagnosis not present

## 2023-03-22 DIAGNOSIS — I482 Chronic atrial fibrillation, unspecified: Secondary | ICD-10-CM | POA: Diagnosis not present

## 2023-03-22 DIAGNOSIS — Z4801 Encounter for change or removal of surgical wound dressing: Secondary | ICD-10-CM | POA: Diagnosis not present

## 2023-03-22 DIAGNOSIS — E11628 Type 2 diabetes mellitus with other skin complications: Secondary | ICD-10-CM | POA: Diagnosis not present

## 2023-03-22 DIAGNOSIS — I255 Ischemic cardiomyopathy: Secondary | ICD-10-CM | POA: Insufficient documentation

## 2023-03-22 DIAGNOSIS — E1122 Type 2 diabetes mellitus with diabetic chronic kidney disease: Secondary | ICD-10-CM | POA: Insufficient documentation

## 2023-03-22 DIAGNOSIS — I13 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, or unspecified chronic kidney disease: Secondary | ICD-10-CM | POA: Diagnosis not present

## 2023-03-22 DIAGNOSIS — X58XXXA Exposure to other specified factors, initial encounter: Secondary | ICD-10-CM | POA: Insufficient documentation

## 2023-03-22 DIAGNOSIS — J4489 Other specified chronic obstructive pulmonary disease: Secondary | ICD-10-CM | POA: Insufficient documentation

## 2023-03-22 DIAGNOSIS — I4891 Unspecified atrial fibrillation: Secondary | ICD-10-CM | POA: Diagnosis not present

## 2023-03-22 DIAGNOSIS — Z87891 Personal history of nicotine dependence: Secondary | ICD-10-CM | POA: Insufficient documentation

## 2023-03-22 DIAGNOSIS — Z794 Long term (current) use of insulin: Secondary | ICD-10-CM | POA: Insufficient documentation

## 2023-03-22 DIAGNOSIS — Z79899 Other long term (current) drug therapy: Secondary | ICD-10-CM | POA: Diagnosis not present

## 2023-03-22 DIAGNOSIS — Z7982 Long term (current) use of aspirin: Secondary | ICD-10-CM | POA: Insufficient documentation

## 2023-03-22 DIAGNOSIS — I509 Heart failure, unspecified: Secondary | ICD-10-CM | POA: Diagnosis not present

## 2023-03-22 DIAGNOSIS — I252 Old myocardial infarction: Secondary | ICD-10-CM | POA: Insufficient documentation

## 2023-03-22 DIAGNOSIS — I97618 Postprocedural hemorrhage and hematoma of a circulatory system organ or structure following other circulatory system procedure: Secondary | ICD-10-CM | POA: Diagnosis not present

## 2023-03-22 DIAGNOSIS — L089 Local infection of the skin and subcutaneous tissue, unspecified: Secondary | ICD-10-CM

## 2023-03-22 DIAGNOSIS — Z48812 Encounter for surgical aftercare following surgery on the circulatory system: Secondary | ICD-10-CM | POA: Diagnosis not present

## 2023-03-22 DIAGNOSIS — I7389 Other specified peripheral vascular diseases: Secondary | ICD-10-CM | POA: Diagnosis not present

## 2023-03-22 DIAGNOSIS — Z9889 Other specified postprocedural states: Secondary | ICD-10-CM | POA: Diagnosis not present

## 2023-03-22 DIAGNOSIS — I110 Hypertensive heart disease with heart failure: Secondary | ICD-10-CM | POA: Diagnosis not present

## 2023-03-22 DIAGNOSIS — I739 Peripheral vascular disease, unspecified: Secondary | ICD-10-CM | POA: Diagnosis present

## 2023-03-22 DIAGNOSIS — S91105A Unspecified open wound of left lesser toe(s) without damage to nail, initial encounter: Secondary | ICD-10-CM | POA: Diagnosis not present

## 2023-03-22 DIAGNOSIS — L97521 Non-pressure chronic ulcer of other part of left foot limited to breakdown of skin: Secondary | ICD-10-CM | POA: Diagnosis not present

## 2023-03-22 DIAGNOSIS — L7622 Postprocedural hemorrhage and hematoma of skin and subcutaneous tissue following other procedure: Secondary | ICD-10-CM | POA: Diagnosis not present

## 2023-03-22 DIAGNOSIS — Z7901 Long term (current) use of anticoagulants: Secondary | ICD-10-CM | POA: Insufficient documentation

## 2023-03-22 DIAGNOSIS — I2510 Atherosclerotic heart disease of native coronary artery without angina pectoris: Secondary | ICD-10-CM | POA: Insufficient documentation

## 2023-03-22 DIAGNOSIS — I724 Aneurysm of artery of lower extremity: Principal | ICD-10-CM | POA: Diagnosis present

## 2023-03-22 DIAGNOSIS — I10 Essential (primary) hypertension: Secondary | ICD-10-CM

## 2023-03-22 DIAGNOSIS — I5022 Chronic systolic (congestive) heart failure: Secondary | ICD-10-CM | POA: Diagnosis not present

## 2023-03-22 DIAGNOSIS — E1029 Type 1 diabetes mellitus with other diabetic kidney complication: Secondary | ICD-10-CM | POA: Insufficient documentation

## 2023-03-22 DIAGNOSIS — Z7902 Long term (current) use of antithrombotics/antiplatelets: Secondary | ICD-10-CM | POA: Insufficient documentation

## 2023-03-22 DIAGNOSIS — L03116 Cellulitis of left lower limb: Secondary | ICD-10-CM | POA: Diagnosis not present

## 2023-03-22 DIAGNOSIS — Z5189 Encounter for other specified aftercare: Principal | ICD-10-CM | POA: Insufficient documentation

## 2023-03-22 DIAGNOSIS — E1152 Type 2 diabetes mellitus with diabetic peripheral angiopathy with gangrene: Secondary | ICD-10-CM | POA: Diagnosis not present

## 2023-03-22 DIAGNOSIS — I872 Venous insufficiency (chronic) (peripheral): Secondary | ICD-10-CM | POA: Diagnosis not present

## 2023-03-22 DIAGNOSIS — L02612 Cutaneous abscess of left foot: Secondary | ICD-10-CM | POA: Diagnosis not present

## 2023-03-22 HISTORY — PX: AMPUTATION: SHX166

## 2023-03-22 LAB — PROTIME-INR
INR: 1.3 — ABNORMAL HIGH (ref 0.8–1.2)
Prothrombin Time: 16.2 s — ABNORMAL HIGH (ref 11.4–15.2)

## 2023-03-22 LAB — POCT I-STAT, CHEM 8
BUN: 30 mg/dL — ABNORMAL HIGH (ref 8–23)
Calcium, Ion: 1.13 mmol/L — ABNORMAL LOW (ref 1.15–1.40)
Chloride: 99 mmol/L (ref 98–111)
Creatinine, Ser: 2.6 mg/dL — ABNORMAL HIGH (ref 0.61–1.24)
Glucose, Bld: 112 mg/dL — ABNORMAL HIGH (ref 70–99)
HCT: 33 % — ABNORMAL LOW (ref 39.0–52.0)
Hemoglobin: 11.2 g/dL — ABNORMAL LOW (ref 13.0–17.0)
Potassium: 3.3 mmol/L — ABNORMAL LOW (ref 3.5–5.1)
Sodium: 137 mmol/L (ref 135–145)
TCO2: 27 mmol/L (ref 22–32)

## 2023-03-22 LAB — CBC WITH DIFFERENTIAL/PLATELET
Abs Immature Granulocytes: 0.29 10*3/uL — ABNORMAL HIGH (ref 0.00–0.07)
Basophils Absolute: 0 10*3/uL (ref 0.0–0.1)
Basophils Relative: 0 %
Eosinophils Absolute: 0 10*3/uL (ref 0.0–0.5)
Eosinophils Relative: 0 %
HCT: 33.4 % — ABNORMAL LOW (ref 39.0–52.0)
Hemoglobin: 10.6 g/dL — ABNORMAL LOW (ref 13.0–17.0)
Immature Granulocytes: 1 %
Lymphocytes Relative: 1 %
Lymphs Abs: 0.3 10*3/uL — ABNORMAL LOW (ref 0.7–4.0)
MCH: 32.5 pg (ref 26.0–34.0)
MCHC: 31.7 g/dL (ref 30.0–36.0)
MCV: 102.5 fL — ABNORMAL HIGH (ref 80.0–100.0)
Monocytes Absolute: 0.1 10*3/uL (ref 0.1–1.0)
Monocytes Relative: 0 %
Neutro Abs: 25.5 10*3/uL — ABNORMAL HIGH (ref 1.7–7.7)
Neutrophils Relative %: 98 %
Platelets: 336 10*3/uL (ref 150–400)
RBC: 3.26 MIL/uL — ABNORMAL LOW (ref 4.22–5.81)
RDW: 16.2 % — ABNORMAL HIGH (ref 11.5–15.5)
WBC: 26.2 10*3/uL — ABNORMAL HIGH (ref 4.0–10.5)
nRBC: 0.2 % (ref 0.0–0.2)

## 2023-03-22 LAB — GLUCOSE, CAPILLARY
Glucose-Capillary: 108 mg/dL — ABNORMAL HIGH (ref 70–99)
Glucose-Capillary: 123 mg/dL — ABNORMAL HIGH (ref 70–99)
Glucose-Capillary: 304 mg/dL — ABNORMAL HIGH (ref 70–99)

## 2023-03-22 LAB — BASIC METABOLIC PANEL
Anion gap: 11 (ref 5–15)
BUN: 30 mg/dL — ABNORMAL HIGH (ref 8–23)
CO2: 25 mmol/L (ref 22–32)
Calcium: 8.7 mg/dL — ABNORMAL LOW (ref 8.9–10.3)
Chloride: 97 mmol/L — ABNORMAL LOW (ref 98–111)
Creatinine, Ser: 2.36 mg/dL — ABNORMAL HIGH (ref 0.61–1.24)
GFR, Estimated: 27 mL/min — ABNORMAL LOW (ref >60)
Glucose, Bld: 275 mg/dL — ABNORMAL HIGH (ref 70–99)
Potassium: 4 mmol/L (ref 3.5–5.1)
Sodium: 133 mmol/L — ABNORMAL LOW (ref 135–145)

## 2023-03-22 LAB — APTT: aPTT: 33 s (ref 24–36)

## 2023-03-22 LAB — CBG MONITORING, ED: Glucose-Capillary: 230 mg/dL — ABNORMAL HIGH (ref 70–99)

## 2023-03-22 SURGERY — AMPUTATION, FOOT, RAY
Anesthesia: Monitor Anesthesia Care | Site: Foot | Laterality: Left

## 2023-03-22 MED ORDER — LACTATED RINGERS IV SOLN
INTRAVENOUS | Status: DC | PRN
Start: 2023-03-22 — End: 2023-03-22

## 2023-03-22 MED ORDER — ALUM & MAG HYDROXIDE-SIMETH 200-200-20 MG/5ML PO SUSP: 15.0000 mL | ORAL | Status: DC | PRN

## 2023-03-22 MED ORDER — POTASSIUM CHLORIDE CRYS ER 20 MEQ PO TBCR
20.0000 meq | EXTENDED_RELEASE_TABLET | Freq: Once | ORAL | Status: DC
  Filled 2023-03-22: qty 2

## 2023-03-22 MED ORDER — ROSUVASTATIN CALCIUM 20 MG PO TABS
40.0000 mg | ORAL_TABLET | Freq: Every day | ORAL | Status: DC
  Administered 2023-03-22 – 2023-03-23 (×2): 40 mg via ORAL
  Filled 2023-03-22 (×2): qty 2

## 2023-03-22 MED ORDER — DEXAMETHASONE SODIUM PHOSPHATE 10 MG/ML IJ SOLN
INTRAMUSCULAR | Status: AC
  Filled 2023-03-22: qty 1

## 2023-03-22 MED ORDER — HYDRALAZINE HCL 20 MG/ML IJ SOLN: 5.0000 mg | INTRAMUSCULAR | Status: DC | PRN

## 2023-03-22 MED ORDER — OXYCODONE HCL 5 MG PO TABS
5.0000 mg | ORAL_TABLET | ORAL | Status: DC | PRN
  Administered 2023-03-22: 5 mg via ORAL
  Filled 2023-03-22: qty 1

## 2023-03-22 MED ORDER — ACETAMINOPHEN 650 MG RE SUPP: 325.0000 mg | RECTAL | Status: DC | PRN

## 2023-03-22 MED ORDER — ONDANSETRON HCL 4 MG/2ML IJ SOLN: 4.0000 mg | Freq: Four times a day (QID) | INTRAMUSCULAR | Status: DC | PRN

## 2023-03-22 MED ORDER — SODIUM CHLORIDE 0.9 % IV SOLN: INTRAVENOUS | Status: DC

## 2023-03-22 MED ORDER — SODIUM CHLORIDE 0.9% FLUSH: 3.0000 mL | Freq: Two times a day (BID) | INTRAVENOUS | Status: DC

## 2023-03-22 MED ORDER — DEXAMETHASONE SODIUM PHOSPHATE 10 MG/ML IJ SOLN
INTRAMUSCULAR | Status: DC | PRN
  Administered 2023-03-22: 10 mg

## 2023-03-22 MED ORDER — PROPOFOL 500 MG/50ML IV EMUL
INTRAVENOUS | Status: DC | PRN
  Administered 2023-03-22: 25 ug/kg/min via INTRAVENOUS

## 2023-03-22 MED ORDER — OXYCODONE HCL 5 MG PO TABS
5.0000 mg | ORAL_TABLET | Freq: Four times a day (QID) | ORAL | 0 refills | Status: AC | PRN
  Filled 2023-03-22: qty 12, 3d supply, fill #0

## 2023-03-22 MED ORDER — CHLORHEXIDINE GLUCONATE 4 % EX SOLN: 60.0000 mL | Freq: Once | CUTANEOUS | Status: DC

## 2023-03-22 MED ORDER — INSULIN ASPART 100 UNIT/ML IJ SOLN
0.0000 [IU] | Freq: Three times a day (TID) | INTRAMUSCULAR | Status: DC
  Administered 2023-03-22: 5 [IU] via SUBCUTANEOUS
  Administered 2023-03-23: 3 [IU] via SUBCUTANEOUS

## 2023-03-22 MED ORDER — SPIRONOLACTONE 25 MG PO TABS
25.0000 mg | ORAL_TABLET | Freq: Two times a day (BID) | ORAL | Status: DC
  Administered 2023-03-22 – 2023-03-23 (×2): 25 mg via ORAL
  Filled 2023-03-22: qty 2
  Filled 2023-03-22: qty 1

## 2023-03-22 MED ORDER — ONDANSETRON HCL 4 MG/2ML IJ SOLN
INTRAMUSCULAR | Status: AC
  Filled 2023-03-22: qty 2

## 2023-03-22 MED ORDER — LABETALOL HCL 5 MG/ML IV SOLN: 10.0000 mg | INTRAVENOUS | Status: DC | PRN

## 2023-03-22 MED ORDER — CHLORHEXIDINE GLUCONATE 0.12 % MT SOLN
OROMUCOSAL | Status: AC
Start: 2023-03-22 — End: 2023-03-22
  Administered 2023-03-22: 15 mL
  Filled 2023-03-22: qty 15

## 2023-03-22 MED ORDER — METOPROLOL TARTRATE 5 MG/5ML IV SOLN: 2.0000 mg | INTRAVENOUS | Status: DC | PRN

## 2023-03-22 MED ORDER — ACETAMINOPHEN 10 MG/ML IV SOLN: 1000.0000 mg | Freq: Once | INTRAVENOUS | Status: DC | PRN

## 2023-03-22 MED ORDER — AMOXICILLIN-POT CLAVULANATE 500-125 MG PO TABS
1.0000 | ORAL_TABLET | Freq: Two times a day (BID) | ORAL | Status: DC
  Administered 2023-03-22 – 2023-03-23 (×2): 1 via ORAL
  Filled 2023-03-22 (×4): qty 1

## 2023-03-22 MED ORDER — LIDOCAINE 2% (20 MG/ML) 5 ML SYRINGE
INTRAMUSCULAR | Status: AC
  Filled 2023-03-22: qty 5

## 2023-03-22 MED ORDER — FENTANYL CITRATE (PF) 100 MCG/2ML IJ SOLN
INTRAMUSCULAR | Status: AC
  Filled 2023-03-22: qty 2

## 2023-03-22 MED ORDER — PHENYLEPHRINE 80 MCG/ML (10ML) SYRINGE FOR IV PUSH (FOR BLOOD PRESSURE SUPPORT)
PREFILLED_SYRINGE | INTRAVENOUS | Status: AC
  Filled 2023-03-22: qty 10

## 2023-03-22 MED ORDER — CEFAZOLIN SODIUM-DEXTROSE 2-4 GM/100ML-% IV SOLN
2.0000 g | INTRAVENOUS | Status: AC
  Administered 2023-03-22: 2 g via INTRAVENOUS
  Filled 2023-03-22: qty 100

## 2023-03-22 MED ORDER — ACETAMINOPHEN 325 MG RE SUPP: 325.0000 mg | RECTAL | Status: DC | PRN

## 2023-03-22 MED ORDER — DOCUSATE SODIUM 100 MG PO CAPS: 100.0000 mg | ORAL_CAPSULE | Freq: Two times a day (BID) | ORAL | Status: DC

## 2023-03-22 MED ORDER — GLIPIZIDE ER 5 MG PO TB24
5.0000 mg | ORAL_TABLET | Freq: Every day | ORAL | Status: DC
  Administered 2023-03-23: 5 mg via ORAL
  Filled 2023-03-22: qty 1

## 2023-03-22 MED ORDER — PHENOL 1.4 % MT LIQD: 1.0000 | OROMUCOSAL | Status: DC | PRN

## 2023-03-22 MED ORDER — GUAIFENESIN-DM 100-10 MG/5ML PO SYRP: 15.0000 mL | ORAL_SOLUTION | ORAL | Status: DC | PRN

## 2023-03-22 MED ORDER — PANTOPRAZOLE SODIUM 40 MG PO TBEC
40.0000 mg | DELAYED_RELEASE_TABLET | Freq: Every day | ORAL | Status: DC
Start: 2023-03-22 — End: 2023-03-23
  Administered 2023-03-22 – 2023-03-23 (×2): 40 mg via ORAL
  Filled 2023-03-22 (×2): qty 1

## 2023-03-22 MED ORDER — METOPROLOL TARTRATE 25 MG PO TABS
75.0000 mg | ORAL_TABLET | Freq: Two times a day (BID) | ORAL | Status: DC
  Administered 2023-03-22: 75 mg via ORAL
  Filled 2023-03-22 (×2): qty 3

## 2023-03-22 MED ORDER — PROPOFOL 10 MG/ML IV BOLUS
INTRAVENOUS | Status: AC
  Filled 2023-03-22: qty 20

## 2023-03-22 MED ORDER — 0.9 % SODIUM CHLORIDE (POUR BTL) OPTIME
TOPICAL | Status: DC | PRN
  Administered 2023-03-22: 1000 mL

## 2023-03-22 MED ORDER — ROPIVACAINE HCL 5 MG/ML IJ SOLN
INTRAMUSCULAR | Status: DC | PRN
  Administered 2023-03-22: 20 mL via PERINEURAL

## 2023-03-22 MED ORDER — EMPAGLIFLOZIN 10 MG PO TABS
10.0000 mg | ORAL_TABLET | Freq: Every day | ORAL | Status: DC
Start: 2023-03-22 — End: 2023-03-23
  Administered 2023-03-22 – 2023-03-23 (×2): 10 mg via ORAL
  Filled 2023-03-22 (×2): qty 1

## 2023-03-22 MED ORDER — PHENYLEPHRINE HCL-NACL 20-0.9 MG/250ML-% IV SOLN
INTRAVENOUS | Status: DC | PRN
  Administered 2023-03-22: 20 ug/min via INTRAVENOUS

## 2023-03-22 MED ORDER — TRAMADOL HCL 50 MG PO TABS: 50.0000 mg | ORAL_TABLET | Freq: Four times a day (QID) | ORAL | Status: DC | PRN

## 2023-03-22 MED ORDER — PROPOFOL 1000 MG/100ML IV EMUL
INTRAVENOUS | Status: AC
Start: 2023-03-22 — End: ?
  Filled 2023-03-22: qty 100

## 2023-03-22 MED ORDER — FENTANYL CITRATE (PF) 100 MCG/2ML IJ SOLN: 25.0000 ug | INTRAMUSCULAR | Status: DC | PRN

## 2023-03-22 MED ORDER — ALLOPURINOL 300 MG PO TABS
300.0000 mg | ORAL_TABLET | Freq: Every day | ORAL | Status: DC
Start: 2023-03-22 — End: 2023-03-23
  Administered 2023-03-22: 300 mg via ORAL
  Filled 2023-03-22: qty 1

## 2023-03-22 MED ORDER — HEPARIN SODIUM (PORCINE) 5000 UNIT/ML IJ SOLN
5000.0000 [IU] | Freq: Three times a day (TID) | INTRAMUSCULAR | Status: DC
  Administered 2023-03-22 – 2023-03-23 (×3): 5000 [IU] via SUBCUTANEOUS
  Filled 2023-03-22 (×3): qty 1

## 2023-03-22 MED ORDER — ROCURONIUM BROMIDE 10 MG/ML (PF) SYRINGE
PREFILLED_SYRINGE | INTRAVENOUS | Status: AC
  Filled 2023-03-22: qty 10

## 2023-03-22 MED ORDER — FUROSEMIDE 40 MG PO TABS
40.0000 mg | ORAL_TABLET | Freq: Two times a day (BID) | ORAL | Status: DC
  Administered 2023-03-22 – 2023-03-23 (×2): 40 mg via ORAL
  Filled 2023-03-22: qty 1
  Filled 2023-03-22: qty 2

## 2023-03-22 MED ORDER — SODIUM CHLORIDE 0.9% FLUSH: 3.0000 mL | INTRAVENOUS | Status: DC | PRN

## 2023-03-22 MED ORDER — SENNOSIDES-DOCUSATE SODIUM 8.6-50 MG PO TABS: 1.0000 | ORAL_TABLET | Freq: Every evening | ORAL | Status: DC | PRN

## 2023-03-22 MED ORDER — EPHEDRINE 5 MG/ML INJ
INTRAVENOUS | Status: AC
  Filled 2023-03-22: qty 5

## 2023-03-22 MED ORDER — DOXAZOSIN MESYLATE 2 MG PO TABS
2.0000 mg | ORAL_TABLET | Freq: Every day | ORAL | Status: DC
  Administered 2023-03-22: 2 mg via ORAL
  Filled 2023-03-22 (×3): qty 1

## 2023-03-22 MED ORDER — AMOXICILLIN-POT CLAVULANATE 875-125 MG PO TABS: 1.0000 | ORAL_TABLET | Freq: Two times a day (BID) | ORAL | Status: DC

## 2023-03-22 MED ORDER — INSULIN ASPART 100 UNIT/ML IJ SOLN: 0.0000 [IU] | INTRAMUSCULAR | Status: DC | PRN

## 2023-03-22 MED ORDER — POLYETHYLENE GLYCOL 3350 17 G PO PACK: 17.0000 g | PACK | Freq: Every day | ORAL | Status: DC | PRN

## 2023-03-22 MED ORDER — ACETAMINOPHEN 325 MG PO TABS: 325.0000 mg | ORAL_TABLET | ORAL | Status: DC | PRN

## 2023-03-22 MED ORDER — ASPIRIN 81 MG PO TBEC
81.0000 mg | DELAYED_RELEASE_TABLET | Freq: Every day | ORAL | Status: DC
  Administered 2023-03-22 – 2023-03-23 (×2): 81 mg via ORAL
  Filled 2023-03-22 (×2): qty 1

## 2023-03-22 MED ORDER — POTASSIUM CHLORIDE CRYS ER 20 MEQ PO TBCR: 20.0000 meq | EXTENDED_RELEASE_TABLET | Freq: Once | ORAL | Status: DC

## 2023-03-22 MED ORDER — SODIUM CHLORIDE 0.9 % IV SOLN: 250.0000 mL | INTRAVENOUS | Status: DC | PRN

## 2023-03-22 MED ORDER — FENTANYL CITRATE (PF) 100 MCG/2ML IJ SOLN
INTRAMUSCULAR | Status: DC | PRN
  Administered 2023-03-22: 50 ug via INTRAVENOUS

## 2023-03-22 SURGICAL SUPPLY — 48 items
BAG COUNTER SPONGE SURGICOUNT (BAG) ×2 IMPLANT
BANDAGE ESMARK 6X9 LF (GAUZE/BANDAGES/DRESSINGS) IMPLANT
BLADE LONG MED 31X9 (MISCELLANEOUS) IMPLANT
BLADE SAW GIGLI 510 (BLADE) ×2 IMPLANT
BNDG COHESIVE 6X5 TAN ST LF (GAUZE/BANDAGES/DRESSINGS) ×2 IMPLANT
BNDG ELASTIC 4INX 5YD STR LF (GAUZE/BANDAGES/DRESSINGS) IMPLANT
BNDG ELASTIC 4X5.8 VLCR STR LF (GAUZE/BANDAGES/DRESSINGS) IMPLANT
BNDG ELASTIC 6INX 5YD STR LF (GAUZE/BANDAGES/DRESSINGS) IMPLANT
BNDG ESMARK 6X9 LF (GAUZE/BANDAGES/DRESSINGS) IMPLANT
BNDG GAUZE DERMACEA FLUFF 4 (GAUZE/BANDAGES/DRESSINGS) IMPLANT
BUR DISC 0.8X25 (BURR) IMPLANT
CANISTER SUCT 3000ML PPV (MISCELLANEOUS) ×2 IMPLANT
CLIP TI MEDIUM 6 (CLIP) ×2 IMPLANT
CLIP TI WIDE RED SMALL 6 (CLIP) ×2 IMPLANT
COVER SURGICAL LIGHT HANDLE (MISCELLANEOUS) ×2 IMPLANT
CUFF TOURN SGL QUICK 42 (TOURNIQUET CUFF) IMPLANT
CUFF TRNQT CYL 34X4.125X (TOURNIQUET CUFF) IMPLANT
DRAIN CHANNEL 19F RND (DRAIN) IMPLANT
DRAPE HALF SHEET 40X57 (DRAPES) ×2 IMPLANT
DRAPE SURG ORHT 6 SPLT 77X108 (DRAPES) ×4 IMPLANT
DRSG ADAPTIC 3X8 NADH LF (GAUZE/BANDAGES/DRESSINGS) ×2 IMPLANT
DRSG XEROFORM 1X8 (GAUZE/BANDAGES/DRESSINGS) IMPLANT
ELECT REM PT RETURN 9FT ADLT (ELECTROSURGICAL) ×1 IMPLANT
ELECTRODE REM PT RTRN 9FT ADLT (ELECTROSURGICAL) ×2 IMPLANT
EVACUATOR SILICONE 100CC (DRAIN) IMPLANT
GAUZE SPONGE 4X4 12PLY STRL (GAUZE/BANDAGES/DRESSINGS) ×2 IMPLANT
GLOVE BIOGEL PI IND STRL 7.0 (GLOVE) ×2 IMPLANT
GOWN STRL REUS W/ TWL LRG LVL3 (GOWN DISPOSABLE) ×4 IMPLANT
GOWN STRL REUS W/ TWL XL LVL3 (GOWN DISPOSABLE) ×2 IMPLANT
KIT BASIN OR (CUSTOM PROCEDURE TRAY) ×2 IMPLANT
KIT TURNOVER KIT B (KITS) ×2 IMPLANT
NS IRRIG 1000ML POUR BTL (IV SOLUTION) ×2 IMPLANT
PACK GENERAL/GYN (CUSTOM PROCEDURE TRAY) ×2 IMPLANT
PAD ARMBOARD 7.5X6 YLW CONV (MISCELLANEOUS) ×4 IMPLANT
POWDER SURGICEL 3.0 GRAM (HEMOSTASIS) IMPLANT
RASP HELIOCORDIAL MED (MISCELLANEOUS) IMPLANT
STAPLER VISISTAT 35W (STAPLE) ×2 IMPLANT
STOCKINETTE IMPERVIOUS LG (DRAPES) ×2 IMPLANT
SUT BONE WAX W31G (SUTURE) IMPLANT
SUT ETHILON 3 0 PS 1 (SUTURE) IMPLANT
SUT SILK 0 TIES 10X30 (SUTURE) ×2 IMPLANT
SUT SILK 2 0 SH CR/8 (SUTURE) ×2 IMPLANT
SUT SILK 2-0 18XBRD TIE 12 (SUTURE) ×2 IMPLANT
SUT SILK 3-0 18XBRD TIE 12 (SUTURE) IMPLANT
SUT VIC AB 2-0 CT1 18 (SUTURE) ×4 IMPLANT
TOWEL GREEN STERILE (TOWEL DISPOSABLE) ×4 IMPLANT
UNDERPAD 30X36 HEAVY ABSORB (UNDERPADS AND DIAPERS) ×2 IMPLANT
WATER STERILE IRR 1000ML POUR (IV SOLUTION) ×2 IMPLANT

## 2023-03-22 NOTE — Progress Notes (Signed)
 Orthopedic Tech Progress Note Patient Details:  Donald Rios May 08, 1940 [id number]  Ortho Devices Type of Ortho Device: Darco shoe Ortho Device/Splint Location: LLE Ortho Device/Splint Interventions: OrderedPACU RN called requesting a DARCO SHOE size 9   Post Interventions Patient Tolerated: Well Instructions Provided: Care of device  Donald Pore 03/22/2023, 8:50 AM

## 2023-03-22 NOTE — Interval H&P Note (Signed)
 History and Physical Interval Note:  03/22/2023 7:18 AM  Donald Rios  has presented today for surgery, with the diagnosis of INFECTION OF LEFT 5TH TOE AND POSSIBLE 4TH TOE.  The various methods of treatment have been discussed with the patient and family. After consideration of risks, benefits and other options for treatment, the patient has consented to  Procedure(s): AMPUTATION, FOOT, RAY (Left) as a surgical intervention.  The patient's history has been reviewed, patient examined, no change in status, stable for surgery.  I have reviewed the patient's chart and labs.  Questions were answered to the patient's satisfaction.     Daria Pastures

## 2023-03-22 NOTE — ED Provider Notes (Signed)
 [place] EMERGENCY DEPARTMENT AT [facility] Provider Note   CSN: [id number] Arrival date & time: 03/22/23  1400     History  Chief Complaint  Patient presents with   Post-op Problem    Donald Rios is a 83 y.o. male.  Patient had surgery this morning.  Patient reports he had a toe amputated.  Patient went home after surgery.  Patient reports he fell and hit his toe.  Patient reports he has had a lot of bleeding from the site since the fall.  Patient denies any other area of injuries.  The history is provided by the patient and the spouse. No language interpreter was used.  Foot Pain This is a new problem.       Home Medications     Allergies    Patient has no known allergies.    Review of Systems   Review of Systems  All other systems reviewed and are negative.   Physical Exam Updated Vital Signs Ht 5\' 8"  (1.727 m)   Wt 63 kg   BMI 21.13 kg/m  Physical Exam Vitals and nursing note reviewed.  Constitutional:      Appearance: He is well-developed.  HENT:     Head: Normocephalic.  Cardiovascular:     Rate and Rhythm: Normal rate.  Pulmonary:     Effort: Pulmonary effort is normal.  Abdominal:     General: There is no distension.  Musculoskeletal:     Cervical back: Normal range of motion.     Comments: Left foot amputated fifth toe sutures in place, oozing  Skin:    General: Skin is warm.  Neurological:     General: No focal deficit present.     Mental Status: He is alert and oriented to person, place, and time.     ED Results / Procedures / Treatments   Labs (all labs ordered are listed, but only abnormal results are displayed) Labs Reviewed  CBC WITH DIFFERENTIAL/PLATELET - Abnormal; Notable for the following components:      Result Value   WBC 26.2 (*)    RBC 3.26 (*)    Hemoglobin 10.6 (*)    HCT 33.4 (*)    MCV 102.5 (*)    RDW 16.2 (*)    Neutro Abs 25.5 (*)    Lymphs Abs 0.3 (*)    Abs Immature Granulocytes 0.29 (*)     All other components within normal limits  BASIC METABOLIC PANEL - Abnormal; Notable for the following components:   Sodium 133 (*)    Chloride 97 (*)    Glucose, Bld 275 (*)    BUN 30 (*)    Creatinine, Ser 2.36 (*)    Calcium 8.7 (*)    GFR, Estimated 27 (*)    All other components within normal limits  PROTIME-INR - Abnormal; Notable for the following components:   Prothrombin Time 16.2 (*)    INR 1.3 (*)    All other components within normal limits  APTT    EKG None  Radiology No results found.  Procedures Procedures    Medications Ordered in ED Medications - No data to display  ED Course/ Medical Decision Making/ A&P                                 Medical Decision Making Patient reports falling after he got home from surgery today.  Patient states incision started bleeding  Amount and/or Complexity of Data Reviewed Independent Historian: spouse    Details: Patient is here with his wife. Labs: ordered. Decision-making details documented in ED Course.    Details: Labs ordered reviewed and interpreted.  Patient's white blood cell count is 26.  BUN and creatinine are elevated to 2.36 and 30. Discussion of management or test interpretation with external provider(s): Dr. Hetty Blend vascular surgery at bedside.  He plans to admit patient for observation.  Risk Decision regarding hospitalization.           Final Clinical Impression(s) / ED Diagnoses Final diagnoses:  Visit for wound check  Bleeding    Rx / DC Orders ED Discharge Orders     None      An After Visit Summary was printed and given to the patient.    Elson Areas, PA-C 03/22/23 1904    Coral Spikes, DO 03/22/23 2352

## 2023-03-22 NOTE — Op Note (Signed)
    OPERATIVE NOTE  PROCEDURE:   Left 5th toe amputation  PRE-OPERATIVE DIAGNOSIS: PAD with small left fifth toe wound and intractable pain  POST-OPERATIVE DIAGNOSIS: same as above   SURGEON: Daria Pastures MD  ASSISTANT(S): none  ANESTHESIA: regional  ESTIMATED BLOOD LOSS: minimal  FINDING(S): Healthy viable soft tissue with healthy bleeding, no evidence of infection  SPECIMEN(S): None  INDICATIONS:   Donald Rios is a 83 y.o. male with PAD with rest pain and a superficial wound of his left fifth toe and on the dorsum of his foot.  He underwent angiogram with left SFA stenting on 02/14/2023.  This was complicated by pseudoaneurysm and underwent thrombin injection of the right femoral pseudoaneurysm on 02/25/2023.  The dorsal foot wound healed and the left fifth toe wound was healing but he continues to have intractable pain.  There are some benefits of left fifth toe amputation reviewed, patient and wife expressed understanding and elected to proceed.  DESCRIPTION: A regional block was performed in the preop center by anesthesia..  The patient then was brought to the operating room and positioned supine on the operating room table.  A timeout was performed and preoperative antibiotics were administered. A circumferential incision was made overlying the PIP joint.  This was carried deeper with sharp dissection until encountering the joint space.  The toe was removed at the PIP joint.  Using a rongeur the proximal phalange was debrided back into the wound.  Wound edges appeared healthy and the wound was copiously irrigated.  Hemostasis was achieved with electrocautery and the wound was then closed with 3-0 nylon vertical mattress sutures.  A sterile dressing was applied and the patient was brought to recovery in stable condition.    COMPLICATIONS: None apparent  CONDITION: Stable  Daria Pastures MD [facility] Phone Number: [phone] 03/22/2023 8:06 AM

## 2023-03-22 NOTE — Anesthesia Postprocedure Evaluation (Signed)
 Anesthesia Post Note  Patient: Donald Rios  Procedure(s) Performed: FIFTH TOE AMPUTATION LEFT FOOT (Left: Foot)     Patient location during evaluation: PACU Anesthesia Type: Regional and MAC Level of consciousness: awake and alert Pain management: pain level controlled Vital Signs Assessment: post-procedure vital signs reviewed and stable Respiratory status: spontaneous breathing, nonlabored ventilation, respiratory function stable and patient connected to nasal cannula oxygen Cardiovascular status: stable and blood pressure returned to baseline Postop Assessment: no apparent nausea or vomiting Anesthetic complications: no   No notable events documented.  Last Vitals:  Vitals:   03/22/23 0815 03/22/23 0830  BP: 118/63 113/62  Pulse: 86 83  Resp: 17 18  Temp: 36.4 C   SpO2: 99% 98%    Last Pain:  Vitals:   03/22/23 0830  TempSrc:   PainSc: 0-No pain                 Earl Lites P Bhavin Monjaraz

## 2023-03-22 NOTE — H&P (Signed)
 Hospital Consult    Reason for Consult:  postop bleeding Requesting Service/Physician:  ED  MRN #:  [id number]  History of Present Illness: This is a 83 y.o. male who underwent left fifth toe amputation today for chronic limb threatening ischemia with a small wound and intractable pain.  No infection was noted and he was discharged from the PACU with a postop shoe.  Upon arriving home he attempted to go up the stairs on his own despite having a regional block done earlier this morning.  He reports he put pressure on his left foot multiple times which caused the amputation stated to start to ooze.  He rested on the floor for few hours but his physical therapist who came by the house was worried about the amount of bleeding and instructed him to come to the emergency department.  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    History of MI (myocardial infarction)    History of nuclear stress test 08/07/2010   dipyridamole; EKG negative for ischemia, low risk scan    Hyperlipidemia    Hyperplastic colon polyp 05/2008   Hypertension    Ischemic cardiomyopathy    EF 45%, with inferior wall motion abnormality    Myocardial infarction (HCC)    Valvular regurgitation    mitral and tricuspid (mild)    Past Surgical History:  Procedure Laterality Date   ABDOMINAL AORTOGRAM W/LOWER EXTREMITY N/A 02/14/2023   Procedure: ABDOMINAL AORTOGRAM W/LOWER EXTREMITY;  Surgeon: Daria Pastures, MD;  Location: [facility];  Service: Cardiovascular;  Laterality:  N/A;   CARDIOVERSION N/A 08/16/2022   Procedure: CARDIOVERSION;  Surgeon: Jake Bathe, MD;  Location: [facility];  Service: Cardiovascular;  Laterality: N/A;   CORONARY ARTERY BYPASS GRAFT  2000   LIMA to LAD, free IMA to OM2, sequential graft to PLA & PLD   ESOPHAGOGASTRODUODENOSCOPY N/A 05/07/2013   Procedure: ESOPHAGOGASTRODUODENOSCOPY (EGD);  Surgeon: Iva Boop, MD;  Location: [facility];  Service: Endoscopy;  Laterality: N/A;   PERIPHERAL VASCULAR INTERVENTION Left 02/14/2023   Procedure: PERIPHERAL VASCULAR INTERVENTION;  Surgeon: Daria Pastures, MD;  Location: [facility] INVASIVE CV LAB;  Service: Cardiovascular;  Laterality: Left;  SFA   TEE WITHOUT CARDIOVERSION N/A 08/16/2022   Procedure: TRANSESOPHAGEAL ECHOCARDIOGRAM;  Surgeon: Jake Bathe, MD;  Location: [facility];  Service: Cardiovascular;  Laterality: N/A;   TRANSTHORACIC ECHOCARDIOGRAM  09/07/2010   EF 45-50%; LV systolic function mildly reduced; LA mildly dilated; mild-mod MR & mild-mod TR; aortic root sclerosis/calcification;     No Known Allergies  Prior to Admission medications   Medication Sig Start Date End Date Taking? Authorizing Provider  allopurinol (ZYLOPRIM) 300 MG tablet TAKE 1 TABLET BY MOUTH DAILY Patient taking differently: Take 300 mg by mouth at bedtime. 01/28/23   [name], MD  amiodarone (PACERONE) 200 MG tablet 1 tablet daily. 03/16/23   Ronney Asters, NP  amoxicillin-clavulanate (AUGMENTIN)  875-125 MG tablet Take 1 tablet by mouth 2 (two) times daily. 03/18/23   Rhyne, Ames Coupe, PA-C  apixaban (ELIQUIS) 5 MG TABS tablet Take 1 tablet (5 mg total) by mouth 2 (two) times daily. 09/20/22   [name], MD  aspirin EC 81 MG tablet Take 1 tablet (81 mg total) by mouth daily. Swallow whole. 02/16/23   Hughie Closs, MD  doxazosin (CARDURA) 2 MG tablet TAKE 1 TABLET BY MOUTH DAILY Patient taking differently: Take 2 mg by mouth at bedtime. 08/04/22   [name], MD   empagliflozin (JARDIANCE) 10 MG TABS tablet Take 1 tablet (10 mg total) by mouth daily. 08/16/22   Arty Baumgartner, NP  furosemide (LASIX) 40 MG tablet Take 1 tablet (40 mg total) by mouth 2 (two) times daily. 11/23/22   [name], MD  glipiZIDE (GLIPIZIDE XL) 5 MG 24 hr tablet Take 1 tablet (5 mg total) by mouth daily with breakfast. 12/21/22   [name], MD  HYDROcodone-acetaminophen (NORCO/VICODIN) 5-325 MG tablet Take 1 tablet by mouth every 6 (six) hours as needed for moderate pain (pain score 4-6). 02/28/23   [name], MD  insulin aspart (FIASP FLEXTOUCH) 100 UNIT/ML FlexTouch Pen Before each meal 3 times a day, 140-199 - 2 units, 200-250 - 4 units, 251-299 - 6 units,  300-349 - 8 units,  350 or above 10 units. Patient taking differently: Inject 3-4 Units into the skin 2 (two) times daily as needed (high blood sugar). 08/16/22   Leroy Sea, MD  metoprolol tartrate (LOPRESSOR) 50 MG tablet TAKE 1 AND 1/2 TABLETS BY MOUTH TWO TIMES A DAY PT NEEDS CPE APPOINTMENT FOR FUTURE REFILLS 03/04/23   [name], MD  metoprolol tartrate 75 MG TABS Take 1 tablet (75 mg total) by mouth 2 (two) times daily. 03/16/23 04/15/23  Ronney Asters, NP  mirtazapine (REMERON) 30 MG tablet TAKE 1 TABLET BY MOUTH EVERY NIGHT AT BEDTIME FOR APPETITE 03/07/23   [name], MD  oxyCODONE (ROXICODONE) 5 MG immediate release tablet Take 1 tablet (5 mg total) by mouth every 6 (six) hours as needed for up to 3 days for severe pain (pain score 7-10). 03/22/23 03/25/23  Daria Pastures, MD  pantoprazole (PROTONIX) 40 MG tablet TAKE 1 TABLET BY MOUTH DAILY 03/04/23   [name], MD  Phenylephrine-APAP-guaiFENesin (TYLENOL SINUS SEVERE) 5-325-200 MG TABS Take 2 tablets by mouth daily as needed (congestion).    [provider]  predniSONE (DELTASONE) 5 MG tablet Take 1 tablet (5 mg total) by mouth daily with breakfast. 03/08/23   Rice, Jamesetta Orleans, MD  rosuvastatin (CRESTOR) 40  MG tablet Take 1 tablet (40 mg total) by mouth daily. 02/15/23 02/15/24  Hughie Closs, MD  spironolactone (ALDACTONE) 25 MG tablet Take 1 tablet (25 mg total) by mouth 2 (two) times daily. 11/23/22 03/22/23  [name], MD  Tocilizumab (ACTEMRA IV) Inject 6 mg/kg into the vein every 28 (twenty-eight) days.    [provider]    Social History   Socioeconomic History   Marital status: Married    Spouse name: Not on file   Number of children: 1   Years of education: Not on file   Highest education level: Not on file  Occupational History   Not on file  Tobacco Use   Smoking status: Former    Current packs/day: 0.00    Types: Cigarettes    Quit date: 11/04/1985  Years since quitting: 37.4   Smokeless tobacco: Never  Vaping Use   Vaping status: Never Used  Substance and Sexual Activity   Alcohol use: Not Currently   Drug use: No   Sexual activity: Not on file  Other Topics Concern   Not on file  Social History Narrative   Not on file   Social Drivers of Health   Financial Resource Strain: Low Risk  (08/24/2022)   Overall Financial Resource Strain (CARDIA)    Difficulty of Paying Living Expenses: Not hard at all  Food Insecurity: No Food Insecurity (02/16/2023)   Hunger Vital Sign    Worried About Running Out of Food in the Last Year: Never true    Ran Out of Food in the Last Year: Never true  Transportation Needs: No Transportation Needs (02/16/2023)   PRAPARE - Administrator, Civil Service (Medical): No    Lack of Transportation (Non-Medical): No  Physical Activity: Sufficiently Active (07/10/2021)   Exercise Vital Sign    Days of Exercise per Week: 5 days    Minutes of Exercise per Session: 30 min  Stress: No Stress Concern Present (08/24/2022)   [employer] of Occupational Health - Occupational Stress Questionnaire    Feeling of Stress : Only a little  Social Connections: Moderately Integrated (02/09/2023)   Social Connection and Isolation  Panel [NHANES]    Frequency of Communication with Friends and Family: Twice a week    Frequency of Social Gatherings with Friends and Family: Twice a week    Attends Religious Services: More than 4 times per year    Active Member of [employer] or Organizations: No    Attends Banker Meetings: Never    Marital Status: Married  Catering manager Violence: Not At Risk (02/16/2023)   Humiliation, Afraid, Rape, and Kick questionnaire    Fear of Current or Ex-Partner: No    Emotionally Abused: No    Physically Abused: No    Sexually Abused: No    Family History  Problem Relation Age of Onset   Stroke Father    Hypertension Father    Diabetes Brother    Esophageal cancer Brother    Stroke Maternal Grandmother    Stroke Maternal Grandfather    Stomach cancer Maternal Aunt    Colon cancer Neg Hx    Rectal cancer Neg Hx     ROS: Otherwise negative unless mentioned in HPI  Physical Examination  There were no vitals filed for this visit. Body mass index is 21.13 kg/m.  General: no acute distress Cardiac: hemodynamically stable Pulm: normal work of breathing Abdomen: non-tender, no pulsatile mass  Neuro: alert, no focal deficit Extremities: Slow trickle from the left fifth toe amputation site.  Sutures intact.    ASSESSMENT/PLAN: This is a 83 y.o. male who underwent left fifth toe amputation today and was discharged earlier this morning.  He presented due to bleeding through his sterile dressing after bearing weight on the left foot. The dressing was taken down at the bedside there was a slight trickle from the medial corner of the amp site.  Pressure was held for about 5 minutes and the site was redressed with dry gauze Kerlix and Coban. Given that he has multiple stairs to his house and underwent regional block today we will plan to admit for observation until family dressing tomorrow morning.  If it hemostatic at this time we will have him practice walking with his front  offloading shoe we will plan  for discharge at that time.   Daria Pastures MD MS [facility] [phone] 03/22/2023  4:04 PM

## 2023-03-22 NOTE — ED Provider Triage Note (Signed)
 Emergency Medicine Provider Triage Evaluation Note  JERIMEY BURRIDGE , a 83 y.o. male  was evaluated in triage.  Pt complains of post-op bleeding.  Review of Systems  Positive:  Negative:   Physical Exam  There were no vitals taken for this visit. Gen:   Awake, no distress   Resp:  Normal effort  MSK:   Moves extremities without difficulty  Other:    Medical Decision Making  Medically screening exam initiated at 2:42 PM.  Appropriate orders placed.  [name] was informed that the remainder of the evaluation will be completed by another provider, this initial triage assessment does not replace that evaluation, and the importance of remaining in the ED until their evaluation is complete.  Toe amputation this morning. Patient went home, had a back spasm which made him collapse to ground. Wound is now bleeding.    Dorthy Cooler, [place] 03/22/23 1443

## 2023-03-22 NOTE — ED Triage Notes (Addendum)
 BIB spouse from home s/p amputation L foot this am. Reports back gave way going up steps, went onto ground w/o falling, but couldn't get up and was subsequently crawling. Endorses back pain, described as pulled muscle from 2 weeks ago continues. Unsure of re-injury to foot. Wearing post op shoe and ACE wrap. ACE wrap saturated with blood. Alert, NAD, calm, interactive. Blood noticed at ~ 1230 by [facility] PT. Taken off of Eliquis 3d ago/before surgery, last dose was last Friday.

## 2023-03-22 NOTE — Anesthesia Procedure Notes (Signed)
 Anesthesia Regional Block: Popliteal block   Pre-Anesthetic Checklist: , timeout performed,  Correct Patient, Correct Site, Correct Laterality,  Correct Procedure, Correct Position, site marked,  Risks and benefits discussed,  Surgical consent,  Pre-op evaluation,  At surgeon's request and post-op pain management  Laterality: Left  Prep: Dura Prep       Needles:  Injection technique: Single-shot  Needle Type: Echogenic Stimulator Needle     Needle Length: 10cm  Needle Gauge: 20     Additional Needles:   Procedures:,,,, ultrasound used (permanent image in chart),,    Narrative:  Start time: 03/22/2023 7:05 AM End time: 03/22/2023 7:10 AM Injection made incrementally with aspirations every 5 mL.  Performed by: Personally  Anesthesiologist: Atilano Median, DO  Additional Notes: Patient identified. Risks/Benefits/Options discussed with patient including but not limited to bleeding, infection, nerve damage, failed block, incomplete pain control. Patient expressed understanding and wished to proceed. All questions were answered. Sterile technique was used throughout the entire procedure. Please see nursing notes for vital signs. Aspirated in 5cc intervals with injection for negative confirmation. Patient was given instructions on fall risk and not to get out of bed. All questions and concerns addressed with instructions to call with any issues or inadequate analgesia.

## 2023-03-22 NOTE — Transfer of Care (Signed)
 Immediate Anesthesia Transfer of Care Note  Patient: Donald Rios  Procedure(s) Performed: FIFTH TOE AMPUTATION LEFT FOOT (Left: Foot)  Patient Location: PACU  Anesthesia Type:MAC  Level of Consciousness: awake, alert , and oriented  Airway & Oxygen Therapy: Patient Spontanous Breathing  Post-op Assessment: Report given to RN and Post -op Vital signs reviewed and stable  Post vital signs: Reviewed and stable  Last Vitals:  Vitals Value Taken Time  BP 118/63 03/22/23 0815  Temp 36.4 C 03/22/23 0815  Pulse 84 03/22/23 0818  Resp 19 03/22/23 0818  SpO2 95 % 03/22/23 0818  Vitals shown include unfiled device data.  Last Pain:  Vitals:   03/22/23 0815  TempSrc:   PainSc: Asleep      Patients Stated Pain Goal: 2 (03/22/23 0630)  Complications: No notable events documented.

## 2023-03-23 ENCOUNTER — Encounter (HOSPITAL_COMMUNITY): Payer: Self-pay | Admitting: Vascular Surgery

## 2023-03-23 DIAGNOSIS — R58 Hemorrhage, not elsewhere classified: Secondary | ICD-10-CM | POA: Diagnosis not present

## 2023-03-23 LAB — GLUCOSE, CAPILLARY
Glucose-Capillary: 153 mg/dL — ABNORMAL HIGH (ref 70–99)
Glucose-Capillary: 118 mg/dL — ABNORMAL HIGH (ref 70–99)

## 2023-03-23 NOTE — Telephone Encounter (Signed)
 Requested medication (s) are due for refill today: yes  Requested medication (s) are on the active medication list: yes  Last refill:  09/20/22 #60 5 RF  Future visit scheduled: yes  Notes to clinic:  abnormal lab work    Requested Prescriptions  Pending Prescriptions Disp Refills   ELIQUIS 5 MG TABS tablet [Pharmacy Med Name: ELIQUIS 5 MG TABLET] 60 tablet 5    Sig: TAKE 1 TABLET BY MOUTH 2 TIMES A DAY     Hematology:  Anticoagulants - apixaban Failed - 03/23/2023  8:24 AM      Failed - HGB in normal range and within 360 days    Hemoglobin  Date Value Ref Range Status  03/22/2023 10.6 (L) 13.0 - 17.0 g/dL Final  16/10/9602 54.0 (L) 13.0 - 17.7 g/dL Final         Failed - HCT in normal range and within 360 days    HCT  Date Value Ref Range Status  03/22/2023 33.4 (L) 39.0 - 52.0 % Final   Hematocrit  Date Value Ref Range Status  09/24/2022 39.9 37.5 - 51.0 % Final         Failed - Cr in normal range and within 360 days    Creat  Date Value Ref Range Status  03/07/2023 1.24 (H) 0.70 - 1.22 mg/dL Final   Creatinine, Ser  Date Value Ref Range Status  03/22/2023 2.36 (H) 0.61 - 1.24 mg/dL Final   Creatinine, Urine  Date Value Ref Range Status  11/13/2021 94 20 - 320 mg/dL Final         Failed - Valid encounter within last 12 months    Recent Outpatient Visits           1 year ago Right carotid bruit   [facility] Pickard, Priscille Heidelberg, MD   2 years ago Dehydration   [facility] [name], MD   2 years ago Dysuria   [facility] [name], MD   2 years ago Type 2 diabetes mellitus without complication, without long-term current use of insulin (HCC)   [facility] [name], MD   2 years ago Benign essential HTN   [facility] Pickard, Priscille Heidelberg, MD       Future Appointments             In 2 weeks Cleaver, Thomasene Ripple, NP [facility]   In 1 month Duanne Guess, MD [facility] - A Dept Of [place]. [facility]   In 2 months Rice, Jamesetta Orleans, MD [facility] - A Dept Of [place]. [facility]            Passed - PLT in normal range and within 360 days    Platelets  Date Value Ref Range Status  03/22/2023 336 150 - 400 K/uL Final  09/24/2022 128 (L) 150 - 450 x10E3/uL Final         Passed - AST in normal range and within 360 days    AST  Date Value Ref Range Status  03/07/2023 11 10 - 35 U/L Final         Passed - ALT in normal range and within 360 days    ALT  Date Value Ref Range Status  03/07/2023 14 9 - 46 U/L Final

## 2023-03-23 NOTE — Progress Notes (Signed)
 [name] to be D/C'd  per MD order.  Discussed with the patient and all questions fully answered.  VSS, Skin clean, dry and intact without evidence of skin break down, no evidence of skin tears noted.  IV catheter discontinued intact. Site without signs and symptoms of complications. Dressing and pressure applied.  An After Visit Summary was printed and given to the patient. Patient received home medication from pharmacy.   D/c education completed with patient/family including follow up instructions, medication list, d/c activities limitations if indicated, with other d/c instructions as indicated by MD - patient able to verbalize understanding, all questions fully answered. Spouse provided education on dressing change. All questions answered.   Patient instructed to return to ED, call 911, or call MD for any changes in condition.   Patient to be escorted via WC, and D/C home via private auto.

## 2023-03-23 NOTE — Progress Notes (Signed)
  Progress Note    03/23/2023 8:46 AM * No surgery found *  Subjective:  no further bleeding from L 5th toe   Vitals:   03/22/23 2314 03/23/23 0527  BP: 104/60 (!) 105/55  Pulse: 96 88  Resp: 19 18  Temp: 98 F (36.7 C) (!) 97.5 F (36.4 C)  SpO2: 98% 100%   Physical Exam: Lungs:  non labored Incisions:  L toe amp site without further bleeding Extremities:  palpable L DP pulse Neurologic: A&O  CBC    Component Value Date/Time   WBC 26.2 (H) 03/22/2023 1444   RBC 3.26 (L) 03/22/2023 1444   HGB 10.6 (L) 03/22/2023 1444   HGB 12.7 (L) 09/24/2022 1507   HCT 33.4 (L) 03/22/2023 1444   HCT 39.9 09/24/2022 1507   PLT 336 03/22/2023 1444   PLT 128 (L) 09/24/2022 1507   MCV 102.5 (H) 03/22/2023 1444   MCV 103 (H) 09/24/2022 1507   MCH 32.5 03/22/2023 1444   MCHC 31.7 03/22/2023 1444   RDW 16.2 (H) 03/22/2023 1444   RDW 16.1 (H) 09/24/2022 1507   LYMPHSABS 0.3 (L) 03/22/2023 1444   MONOABS 0.1 03/22/2023 1444   EOSABS 0.0 03/22/2023 1444   BASOSABS 0.0 03/22/2023 1444    BMET    Component Value Date/Time   NA 133 (L) 03/22/2023 1444   NA 142 08/30/2022 1436   K 4.0 03/22/2023 1444   CL 97 (L) 03/22/2023 1444   CO2 25 03/22/2023 1444   GLUCOSE 275 (H) 03/22/2023 1444   BUN 30 (H) 03/22/2023 1444   BUN 22 08/30/2022 1436   CREATININE 2.36 (H) 03/22/2023 1444   CREATININE 1.24 (H) 03/07/2023 1111   CALCIUM 8.7 (L) 03/22/2023 1444   GFRNONAA 27 (L) 03/22/2023 1444   GFRNONAA 62 09/13/2019 0907   GFRAA 72 09/13/2019 0907    INR    Component Value Date/Time   INR 1.3 (H) 03/22/2023 1444     Intake/Output Summary (Last 24 hours) at 03/23/2023 0846 Last data filed at 03/23/2023 5784 Gross per 24 hour  Intake 250 ml  Output 275 ml  Net -25 ml     Assessment/Plan:  83 y.o. male is s/p L 5th toe amp re-admitted due to surgical site bleeding  L fifth toe amp site without further bleeding RN to educate patient's wife on daily dressing changes Walk with  post op shoe; heel weight bearing only Ok for discharge home today   Emilie Rutter, [place] [facility] [phone] 03/23/2023 8:46 AM

## 2023-03-23 NOTE — Care Management Obs Status (Signed)
 MEDICARE OBSERVATION STATUS NOTIFICATION   Patient Details  Name: MARTEZE VECCHIO MRN: [id number] Date of Birth: January 30, 1940   Medicare Observation Status Notification Given:  Yes    Kingsley Plan, RN 03/23/2023, 12:52 PM

## 2023-03-23 NOTE — Plan of Care (Signed)

## 2023-03-23 NOTE — Progress Notes (Addendum)
   03/23/23 1302  TOC Brief Assessment  Insurance and Status Reviewed  Patient has primary care physician Yes  Home environment has been reviewed wife  Prior level of function: independent  Prior/Current Home Services No current home services  Social Drivers of Health Review SDOH reviewed no interventions necessary  Readmission risk has been reviewed Yes  Transition of care needs no transition of care needs at this time     Patient from home with wife. Patient active with [facility] for HHPT.  Cory with [facility] aware . Entered orders for [facility] and HHPT asked PA to sign  Bedside nurse to teach wife wound care. Patient and wife voiced understanding

## 2023-03-24 NOTE — Discharge Summary (Signed)
  Discharge Summary  Patient ID: Donald Rios [id number] 83 y.o. Dec 12, 1940  Admit date: 03/22/2023  Discharge date and time: 03/23/2023  3:07 PM   Admitting Physician: Daria Pastures, MD   Discharge Physician: same  Admission Diagnoses: Bleeding [R58] PAD (peripheral artery disease) (HCC) [I73.9] Visit for wound check [Z51.89]  Discharge Diagnoses: same  Admission Condition: fair  Discharged Condition: fair  Indication for Admission: bleeding from surgery site  Hospital Course: [name] is an 83 year old male who was brought in as an outpatient and underwent left fifth toe amputation by Dr. Hetty Blend on 03/22/2023.  He was discharged from PACU.  He noticed bleeding from the surgery site at home and return to the hospital.  He was admitted for observation.  Postoperative day #1 there was no further bleeding at the amputation site.  The nursing staff reviewed daily dressing changes with the patient's wife.  Patient was ambulating well with a postoperative shoe.  He will be heel weightbearing.  Home health PT was arranged by TOC.  He will follow-up in the office for incision check in about 3 weeks.  He was discharged home in stable condition.  Consults: None  Treatments: surgery: Left fifth toe amputation by Dr. Hetty Blend on 03/22/2023  Discharge Exam: See progress note 03/23/23 Vitals:   03/23/23 0926 03/23/23 1152  BP: (!) 89/45 (!) 89/49  Pulse: 87 87  Resp:    Temp:    SpO2:       Disposition: Discharge disposition: 01-Home or Self Care       Patient Instructions:    Activity: activity as tolerated Diet: regular diet Wound Care: as directed, daily dressing changes to the toe amputation site  Follow-up with [facility] in 3 weeks.  Signed: Emilie Rutter, PA-C 03/24/2023 10:45 AM [facility] Office: [phone]

## 2023-03-25 ENCOUNTER — Encounter: Payer: Self-pay | Admitting: Family Medicine

## 2023-03-25 ENCOUNTER — Ambulatory Visit: Admitting: Family Medicine

## 2023-03-25 VITALS — BP 126/72 | HR 99 | Temp 97.5°F | Ht 68.0 in | Wt 149.0 lb

## 2023-03-25 DIAGNOSIS — L03116 Cellulitis of left lower limb: Secondary | ICD-10-CM | POA: Diagnosis not present

## 2023-03-25 DIAGNOSIS — L97521 Non-pressure chronic ulcer of other part of left foot limited to breakdown of skin: Secondary | ICD-10-CM | POA: Diagnosis not present

## 2023-03-25 DIAGNOSIS — E1152 Type 2 diabetes mellitus with diabetic peripheral angiopathy with gangrene: Secondary | ICD-10-CM | POA: Diagnosis not present

## 2023-03-25 DIAGNOSIS — Z48812 Encounter for surgical aftercare following surgery on the circulatory system: Secondary | ICD-10-CM | POA: Diagnosis not present

## 2023-03-25 DIAGNOSIS — J40 Bronchitis, not specified as acute or chronic: Secondary | ICD-10-CM

## 2023-03-25 DIAGNOSIS — I872 Venous insufficiency (chronic) (peripheral): Secondary | ICD-10-CM | POA: Diagnosis not present

## 2023-03-25 DIAGNOSIS — M545 Low back pain, unspecified: Secondary | ICD-10-CM

## 2023-03-25 DIAGNOSIS — L02612 Cutaneous abscess of left foot: Secondary | ICD-10-CM | POA: Diagnosis not present

## 2023-03-25 MED ORDER — DOXYCYCLINE HYCLATE 100 MG PO TABS: 100.0000 mg | ORAL_TABLET | Freq: Two times a day (BID) | ORAL | 0 refills | Status: DC

## 2023-03-25 MED ORDER — HYDROCODONE-ACETAMINOPHEN 5-325 MG PO TABS: 1.0000 | ORAL_TABLET | Freq: Four times a day (QID) | ORAL | 0 refills | Status: DC | PRN

## 2023-03-25 MED ORDER — PREDNISONE 20 MG PO TABS: ORAL_TABLET | ORAL | 0 refills | Status: DC

## 2023-03-25 NOTE — Progress Notes (Signed)
 Subjective:    Patient ID: Donald Rios, male    DOB: [birth year], 83 y.o.   MRN: [id number] Due to a nonhealing ulcer on his left foot, the patient was hospitalized and underwent revascularization of his left lower leg.  Due to ischemic pain in his left little toe patient required removal of the left fifth digit.  Fortunately the nonhealing ulcer on the dorsum of his left foot now healed.  However sitting the hospital he developed an upper respiratory infection.  He now has a cough productive of yellow mucus.  He has shortness of breath and wheezing.  He had a remote history of smoking and no history of asthma.  However on his exam today he is wheezing in all 4 lung field coarse breath sounds bilaterally.  He also reports back pain roughly at the level of L2.  The patient fell at home after his recent hospitalization and twisted his back as he fell.  He now has tenderness to palpation around L1 and L2 in the center of his back.  He denies any saddle anesthesia or bowel or bladder incontinence. 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    History of MI (myocardial infarction)    History of nuclear stress test 08/07/2010   dipyridamole; EKG negative for ischemia, low risk scan    Hyperlipidemia    Hyperplastic colon polyp 05/2008   Hypertension    Ischemic cardiomyopathy    EF 45%, with inferior wall motion abnormality    Myocardial infarction (HCC)    Valvular regurgitation    mitral and tricuspid (mild)   Past Surgical History:  Procedure Laterality  Date   ABDOMINAL AORTOGRAM W/LOWER EXTREMITY N/A 02/14/2023   Procedure: ABDOMINAL AORTOGRAM W/LOWER EXTREMITY;  Surgeon: Daria Pastures, MD;  Location: [facility];  Service: Cardiovascular;  Laterality: N/A;   AMPUTATION Left 03/22/2023   Procedure: FIFTH TOE AMPUTATION LEFT FOOT;  Surgeon: Daria Pastures, MD;  Location: [facility] OR;  Service: Vascular;  Laterality: Left;   CARDIOVERSION N/A 08/16/2022   Procedure: CARDIOVERSION;  Surgeon: Jake Bathe, MD;  Location: [facility];  Service: Cardiovascular;  Laterality: N/A;   CORONARY ARTERY BYPASS GRAFT  2000   LIMA to LAD, free IMA to OM2, sequential graft to PLA & PLD   ESOPHAGOGASTRODUODENOSCOPY N/A 05/07/2013   Procedure: ESOPHAGOGASTRODUODENOSCOPY (EGD);  Surgeon: Iva Boop, MD;  Location: [facility] ENDOSCOPY;  Service: Endoscopy;  Laterality: N/A;   PERIPHERAL VASCULAR INTERVENTION Left 02/14/2023   Procedure: PERIPHERAL VASCULAR INTERVENTION;  Surgeon: Daria Pastures, MD;  Location: [facility] INVASIVE CV LAB;  Service: Cardiovascular;  Laterality: Left;  SFA   TEE WITHOUT CARDIOVERSION N/A 08/16/2022   Procedure: TRANSESOPHAGEAL ECHOCARDIOGRAM;  Surgeon: Jake Bathe, MD;  Location: [facility];  Service: Cardiovascular;  Laterality: N/A;   TRANSTHORACIC ECHOCARDIOGRAM  09/07/2010   EF 45-50%; LV systolic function mildly reduced; LA mildly dilated; mild-mod MR &  mild-mod TR; aortic root sclerosis/calcification;    Med list reviewed.    No Known Allergies Social History   Socioeconomic History   Marital status: Married    Spouse name: Not on file   Number of children: 1   Years of education: Not on file   Highest education level: Not on file  Occupational History   Not on file  Tobacco Use   Smoking status: Former    Current packs/day: 0.00    Types: Cigarettes    Quit date: 11/04/1985    Years since quitting: 37.4   Smokeless tobacco: Never  Vaping Use   Vaping status: Never Used  Substance and Sexual Activity    Alcohol use: Not Currently   Drug use: No   Sexual activity: Not on file  Other Topics Concern   Not on file  Social History Narrative   Not on file   Social Drivers of Health   Financial Resource Strain: Low Risk  (08/24/2022)   Overall Financial Resource Strain (CARDIA)    Difficulty of Paying Living Expenses: Not hard at all  Food Insecurity: No Food Insecurity (03/22/2023)   Hunger Vital Sign    Worried About Running Out of Food in the Last Year: Never true    Ran Out of Food in the Last Year: Never true  Transportation Needs: No Transportation Needs (03/22/2023)   PRAPARE - Administrator, Civil Service (Medical): No    Lack of Transportation (Non-Medical): No  Physical Activity: Sufficiently Active (07/10/2021)   Exercise Vital Sign    Days of Exercise per Week: 5 days    Minutes of Exercise per Session: 30 min  Stress: No Stress Concern Present (08/24/2022)   [employer] of Occupational Health - Occupational Stress Questionnaire    Feeling of Stress : Only a little  Social Connections: Moderately Integrated (03/22/2023)   Social Connection and Isolation Panel [NHANES]    Frequency of Communication with Friends and Family: Twice a week    Frequency of Social Gatherings with Friends and Family: Twice a week    Attends Religious Services: More than 4 times per year    Active Member of [employer] or Organizations: No    Attends Banker Meetings: Never    Marital Status: Married  Catering manager Violence: Not At Risk (03/22/2023)   Humiliation, Afraid, Rape, and Kick questionnaire    Fear of Current or Ex-Partner: No    Emotionally Abused: No    Physically Abused: No    Sexually Abused: No     Review of Systems  All other systems reviewed and are negative.      Objective:   Physical Exam Vitals reviewed.  Constitutional:      Appearance: He is well-developed.  Neck:     Thyroid: No thyromegaly.     Vascular: No JVD.  Cardiovascular:      Rate and Rhythm: Normal rate. Rhythm irregular.     Heart sounds: Normal heart sounds. No murmur heard. Pulmonary:     Effort: Pulmonary effort is normal. No respiratory distress.     Breath sounds: Wheezing and rhonchi present. No rales.  Chest:     Chest wall: No tenderness.  Abdominal:     General: Bowel sounds are normal. There is no distension.     Palpations: Abdomen is soft. There is no mass.     Tenderness: There is no abdominal tenderness. There is no guarding or rebound.  Musculoskeletal:  Cervical back: Neck supple.     Lumbar back: Tenderness and bony tenderness present. No spasms. Decreased range of motion.       Back:     Right lower leg: No edema.     Left lower leg: No edema.  Lymphadenopathy:     Cervical: No cervical adenopathy.           Assessment & Plan:  Acute midline low back pain without sciatica - Plan: DG Lumbar Spine Complete  Bronchitis with acute wheezing Given the fact the back pain began after a fall, I would like to get an x-ray of his back to ensure he is not suffered a vertebral fracture.  Meanwhile I will treat the pain with hydrocodone/acetaminophen 5/325 1 p.o. every 6 hours.  Hopefully he just pulled a muscle in his back when he fell.  I am concerned by his wheezing and rhonchorous breath sounds.  I believe the patient has developed bronchitis with bronchospasms.  Begin a prednisone taper pack and use doxycycline 100 mg twice daily for 7 days for bronchitis.  Recheck next week if no better or sooner if worse

## 2023-03-26 DIAGNOSIS — L97521 Non-pressure chronic ulcer of other part of left foot limited to breakdown of skin: Secondary | ICD-10-CM | POA: Diagnosis not present

## 2023-03-26 DIAGNOSIS — K5790 Diverticulosis of intestine, part unspecified, without perforation or abscess without bleeding: Secondary | ICD-10-CM | POA: Diagnosis not present

## 2023-03-26 DIAGNOSIS — L03116 Cellulitis of left lower limb: Secondary | ICD-10-CM | POA: Diagnosis not present

## 2023-03-26 DIAGNOSIS — M1990 Unspecified osteoarthritis, unspecified site: Secondary | ICD-10-CM | POA: Diagnosis not present

## 2023-03-26 DIAGNOSIS — J4489 Other specified chronic obstructive pulmonary disease: Secondary | ICD-10-CM | POA: Diagnosis not present

## 2023-03-26 DIAGNOSIS — E1122 Type 2 diabetes mellitus with diabetic chronic kidney disease: Secondary | ICD-10-CM | POA: Diagnosis not present

## 2023-03-26 DIAGNOSIS — I872 Venous insufficiency (chronic) (peripheral): Secondary | ICD-10-CM | POA: Diagnosis not present

## 2023-03-26 DIAGNOSIS — H5462 Unqualified visual loss, left eye, normal vision right eye: Secondary | ICD-10-CM | POA: Diagnosis not present

## 2023-03-26 DIAGNOSIS — K2100 Gastro-esophageal reflux disease with esophagitis, without bleeding: Secondary | ICD-10-CM | POA: Diagnosis not present

## 2023-03-26 DIAGNOSIS — I252 Old myocardial infarction: Secondary | ICD-10-CM | POA: Diagnosis not present

## 2023-03-26 DIAGNOSIS — Z48812 Encounter for surgical aftercare following surgery on the circulatory system: Secondary | ICD-10-CM | POA: Diagnosis not present

## 2023-03-26 DIAGNOSIS — H9190 Unspecified hearing loss, unspecified ear: Secondary | ICD-10-CM | POA: Diagnosis not present

## 2023-03-26 DIAGNOSIS — I2510 Atherosclerotic heart disease of native coronary artery without angina pectoris: Secondary | ICD-10-CM | POA: Diagnosis not present

## 2023-03-26 DIAGNOSIS — E785 Hyperlipidemia, unspecified: Secondary | ICD-10-CM | POA: Diagnosis not present

## 2023-03-26 DIAGNOSIS — I70262 Atherosclerosis of native arteries of extremities with gangrene, left leg: Secondary | ICD-10-CM | POA: Diagnosis not present

## 2023-03-26 DIAGNOSIS — I255 Ischemic cardiomyopathy: Secondary | ICD-10-CM | POA: Diagnosis not present

## 2023-03-26 DIAGNOSIS — E1152 Type 2 diabetes mellitus with diabetic peripheral angiopathy with gangrene: Secondary | ICD-10-CM | POA: Diagnosis not present

## 2023-03-26 DIAGNOSIS — D631 Anemia in chronic kidney disease: Secondary | ICD-10-CM | POA: Diagnosis not present

## 2023-03-26 DIAGNOSIS — I482 Chronic atrial fibrillation, unspecified: Secondary | ICD-10-CM | POA: Diagnosis not present

## 2023-03-26 DIAGNOSIS — I13 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, or unspecified chronic kidney disease: Secondary | ICD-10-CM | POA: Diagnosis not present

## 2023-03-26 DIAGNOSIS — I959 Hypotension, unspecified: Secondary | ICD-10-CM | POA: Diagnosis not present

## 2023-03-26 DIAGNOSIS — M109 Gout, unspecified: Secondary | ICD-10-CM | POA: Diagnosis not present

## 2023-03-26 DIAGNOSIS — I5022 Chronic systolic (congestive) heart failure: Secondary | ICD-10-CM | POA: Diagnosis not present

## 2023-03-26 DIAGNOSIS — N182 Chronic kidney disease, stage 2 (mild): Secondary | ICD-10-CM | POA: Diagnosis not present

## 2023-03-26 DIAGNOSIS — L02612 Cutaneous abscess of left foot: Secondary | ICD-10-CM | POA: Diagnosis not present

## 2023-03-28 DIAGNOSIS — L03116 Cellulitis of left lower limb: Secondary | ICD-10-CM | POA: Diagnosis not present

## 2023-03-28 DIAGNOSIS — L02612 Cutaneous abscess of left foot: Secondary | ICD-10-CM | POA: Diagnosis not present

## 2023-03-28 DIAGNOSIS — Z48812 Encounter for surgical aftercare following surgery on the circulatory system: Secondary | ICD-10-CM | POA: Diagnosis not present

## 2023-03-28 DIAGNOSIS — I872 Venous insufficiency (chronic) (peripheral): Secondary | ICD-10-CM | POA: Diagnosis not present

## 2023-03-28 DIAGNOSIS — L97521 Non-pressure chronic ulcer of other part of left foot limited to breakdown of skin: Secondary | ICD-10-CM | POA: Diagnosis not present

## 2023-03-28 DIAGNOSIS — E1152 Type 2 diabetes mellitus with diabetic peripheral angiopathy with gangrene: Secondary | ICD-10-CM | POA: Diagnosis not present

## 2023-03-29 ENCOUNTER — Encounter (HOSPITAL_BASED_OUTPATIENT_CLINIC_OR_DEPARTMENT_OTHER): Attending: General Surgery | Admitting: General Surgery

## 2023-03-29 ENCOUNTER — Ambulatory Visit
Admission: RE | Admit: 2023-03-29 | Discharge: 2023-03-29 | Disposition: A | Discharge status: Home / Self Care | Source: Ambulatory Visit | Attending: Family Medicine | Admitting: Family Medicine

## 2023-03-29 DIAGNOSIS — Z7952 Long term (current) use of systemic steroids: Secondary | ICD-10-CM | POA: Diagnosis not present

## 2023-03-29 DIAGNOSIS — L89323 Pressure ulcer of left buttock, stage 3: Secondary | ICD-10-CM | POA: Diagnosis not present

## 2023-03-29 DIAGNOSIS — M545 Low back pain, unspecified: Secondary | ICD-10-CM

## 2023-04-04 NOTE — Progress Notes (Unsigned)
 Cardiology Clinic Note   Patient Name: NADAV SWINDELL Date of Encounter: 04/07/2023  Primary Care Provider:  [name], MD Primary Cardiologist:  Chrystie Nose, MD  Patient Profile    MICKEY ESGUERRA 83 year old male presents to the clinic today for follow-up evaluation of his atrial fibrillation.  Past Medical History    Past Medical History:  Diagnosis Date   Acute gastric ulcer with hemorrhage 05/06/2013   Arthritis    Asthma    Atrial enlargement, left    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    History of MI (myocardial infarction)    History of nuclear stress test 08/07/2010   dipyridamole; EKG negative for ischemia, low risk scan    Hyperlipidemia    Hyperplastic colon polyp 05/2008   Hypertension    Ischemic cardiomyopathy    EF 45%, with inferior wall motion abnormality    Myocardial infarction (HCC)    Valvular regurgitation    mitral and tricuspid (mild)   Past Surgical History:  Procedure Laterality Date   ABDOMINAL AORTOGRAM W/LOWER EXTREMITY N/A 02/14/2023   Procedure: ABDOMINAL AORTOGRAM W/LOWER EXTREMITY;  Surgeon: Daria Pastures, MD;  Location: [facility] INVASIVE CV LAB;  Service: Cardiovascular;  Laterality: N/A;   AMPUTATION Left 03/22/2023   Procedure: FIFTH TOE AMPUTATION LEFT FOOT;  Surgeon: Daria Pastures, MD;  Location: [facility] OR;  Service: Vascular;  Laterality: Left;   CARDIOVERSION N/A 08/16/2022   Procedure: CARDIOVERSION;  Surgeon: Jake Bathe, MD;  Location: [facility] INVASIVE CV LAB;  Service: Cardiovascular;  Laterality: N/A;   CORONARY ARTERY BYPASS GRAFT  2000   LIMA to LAD, free IMA to OM2, sequential graft to PLA & PLD    ESOPHAGOGASTRODUODENOSCOPY N/A 05/07/2013   Procedure: ESOPHAGOGASTRODUODENOSCOPY (EGD);  Surgeon: Iva Boop, MD;  Location: [facility] ENDOSCOPY;  Service: Endoscopy;  Laterality: N/A;   PERIPHERAL VASCULAR INTERVENTION Left 02/14/2023   Procedure: PERIPHERAL VASCULAR INTERVENTION;  Surgeon: Daria Pastures, MD;  Location: [facility] INVASIVE CV LAB;  Service: Cardiovascular;  Laterality: Left;  SFA   TEE WITHOUT CARDIOVERSION N/A 08/16/2022   Procedure: TRANSESOPHAGEAL ECHOCARDIOGRAM;  Surgeon: Jake Bathe, MD;  Location: [facility] INVASIVE CV LAB;  Service: Cardiovascular;  Laterality: N/A;   TRANSTHORACIC ECHOCARDIOGRAM  09/07/2010   EF 45-50%; LV systolic function mildly reduced; LA mildly dilated; mild-mod MR & mild-mod TR; aortic root sclerosis/calcification;     Allergies  No Known Allergies  History of Present Illness    MARX DOIG has a PMH of coronary artery disease status post CABG in 2000 (LIMA-LAD, LIMA-OM 2 and SVG-PLA and PLD.  Echocardiogram at that time showed an LVEF of 45-50%.  A1c was 5.6 down from 6.0.  His LDL particle #1035 and LDL-C of 79.  His simvastatin was reduced to 40 mg daily due to muscle aches.  His PMH also includes hyperlipidemia, chronic systolic CHF, and atrial fibrillation.  He continue to follow-up with Dr. Rennis Golden regularly.  He underwent stress testing in 2015 which showed no ischemia.  He continued to have good control with his diabetes and hyperlipidemia.  Echocardiogram 2021 showed  normal LV function.  He was seen in follow-up by Dr. Rennis Golden 01/13/2023.  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  He underwent TEE and cardioversion for atrial fibrillation.  His LVEF was noted to be 25-30%.  However, he returned back to atrial fibrillation after converting to sinus rhythm.  His repeat echocardiogram 11/24 after some up titration of GDMT showed an EF  of 25%.  His RV function was also moderately reduced.  He was noted to have moderate left atrial enlargement and mild right atrial enlargement.  Moderate mitral valve regurgitation was noted.  He felt that he was not that symptomatic.  He denied chest pain but did report some fatigue and shortness of breath.  He was started on amiodarone 200 mg twice daily followed by 200 mg daily.  Plan for follow-up in 4 weeks with repeat EKG was made.  If he remained in atrial fibrillation plan for DCCV with Dr. Rennis Golden was discussed.  Also, it was recommended that we continue to uptitrate GDMT.  It was felt that he would also need ischemic evaluation.  He presented to the clinic 03/16/23 for follow-up evaluation and stated he was taking his Eliquis once daily due to a nosebleed.  We reviewed his recent hospitalization 02/09/2023 until 02/15/2023.  He was sent to the emergency room due to having more lower extremity pain and redness from the wound care center.  In the emergency room he was hypotensive and improved with IV fluids.  He continued to be in atrial fibrillation.  His leukocyte count was 31,000.  His blood sugar was 235.  His creatinine was 1.56 and his lactic acid was noted to be 3.2.  Blood cultures were collected and he was started on broad-spectrum antibiotics.  In clinic his EKG showed atrial fibrillation with rapid ventricular response 122 bpm.  He reported decreased endurance and fatigue.  His blood pressure was 115/58 and on recheck his pulse is 104.  Case was reviewed with DOD.  I  uptitrated his metoprolol to 75 mg twice daily, reiterated the importance of taking his Eliquis twice daily and will planned follow-up in 3 weeks to reevaluate for DCCV.  He presents to the clinic today for follow-up evaluation and states he continues to be fatigued.  He reports compliance with his apixaban.  His EKG today continues to show atrial fibrillation.  He reports that he has been working with vein and vascular for his  circulation and left lower extremity healing post amputation.  We reviewed his previous cardiac visit.  He expressed understanding.  His rate is better controlled today.  He is cardiac unaware.  His blood pressure today is 93/58.  Case was reviewed with DOD.  Due to persistent nature of atrial fibrillation we will refer to A-fib clinic for further management of his atrial fibrillation.  Will plan follow-up in 3 months and plan for repeat echocardiogram around that time.  Today he denies chest pain, shortness of breath, lower extremity edema,  palpitations, melena, hematuria, hemoptysis, diaphoresis, weakness, presyncope, syncope, orthopnea, and PND.     Home Medications    Prior to Admission medications   Medication Sig Start Date End Date Taking? Authorizing Provider  allopurinol (ZYLOPRIM) 300 MG tablet TAKE 1 TABLET BY MOUTH DAILY Patient taking differently: Take 300 mg by mouth at bedtime. 01/28/23   [name], MD  amiodarone (PACERONE) 200 MG  tablet Take 1 tablet by mouth twice daily for 14 days then decrease to 1 tablet once daily. 01/13/23   Hilty, Lisette Abu, MD  apixaban (ELIQUIS) 5 MG TABS tablet Take 1 tablet (5 mg total) by mouth 2 (two) times daily. Patient taking differently: Take 5 mg by mouth daily. 09/20/22   [name], MD  aspirin EC 81 MG tablet Take 1 tablet (81 mg total) by mouth daily. Swallow whole. 02/16/23   Hughie Closs, MD  clopidogrel (PLAVIX) 75 MG tablet Take 1 tablet (75 mg total) by mouth daily. 02/16/23 03/18/23  Hughie Closs, MD  doxazosin (CARDURA) 2 MG tablet TAKE 1 TABLET BY MOUTH DAILY Patient taking differently: Take 2 mg by mouth at bedtime. 08/04/22   [name], MD  empagliflozin (JARDIANCE) 10 MG TABS tablet Take 1 tablet (10 mg total) by mouth daily. 08/16/22   Arty Baumgartner, NP  furosemide (LASIX) 40 MG tablet Take 1 tablet (40 mg total) by mouth 2 (two) times daily. 11/23/22   [name], MD  glipiZIDE (GLIPIZIDE XL) 5 MG 24 hr  tablet Take 1 tablet (5 mg total) by mouth daily with breakfast. 12/21/22   [name], MD  HYDROcodone-acetaminophen (NORCO/VICODIN) 5-325 MG tablet Take 1 tablet by mouth every 6 (six) hours as needed for moderate pain (pain score 4-6). 02/28/23   [name], MD  insulin aspart (FIASP FLEXTOUCH) 100 UNIT/ML FlexTouch Pen Before each meal 3 times a day, 140-199 - 2 units, 200-250 - 4 units, 251-299 - 6 units,  300-349 - 8 units,  350 or above 10 units. Patient taking differently: Inject 3-7 Units into the skin as needed. Before each meal 3 times a day, 140-199 - 2 units, 200-250 - 4 units, 251-299 - 6 units,  300-349 - 8 units,  350 or above 10 units. 08/16/22   Leroy Sea, MD  metoprolol tartrate (LOPRESSOR) 50 MG tablet TAKE 1 AND 1/2 TABLETS BY MOUTH TWO TIMES A DAY PT NEEDS CPE APPOINTMENT FOR FUTURE REFILLS Patient not taking: Reported on 03/07/2023 03/04/23   [name], MD  metoprolol tartrate (LOPRESSOR) 50 MG tablet Take 1 tablet (50 mg total) by mouth 2 (two) times daily. 02/15/23 03/17/23  Hughie Closs, MD  mirtazapine (REMERON) 30 MG tablet TAKE 1 TABLET BY MOUTH EVERY NIGHT AT BEDTIME FOR APPETITE 03/07/23   [name], MD  pantoprazole (PROTONIX) 40 MG tablet TAKE 1 TABLET BY MOUTH DAILY 03/04/23   [name], MD  predniSONE (DELTASONE) 5 MG tablet Take 1 tablet (5 mg total) by mouth daily with breakfast. 03/08/23   Fuller Plan, MD  rosuvastatin (CRESTOR) 40 MG tablet Take 1 tablet (40 mg total) by mouth daily. 02/15/23 02/15/24  Hughie Closs, MD  silver sulfADIAZINE (SILVADENE) 1 % cream Apply 1 Application topically daily. 02/02/23   [provider]  simvastatin (ZOCOR) 40 MG tablet Take 40 mg by mouth at bedtime. Patient not taking: Reported on 03/07/2023 02/16/23   [provider]  spironolactone (ALDACTONE) 25 MG tablet Take 1 tablet (25 mg total) by mouth 2 (two) times daily. 11/23/22 02/21/23  [name], MD    Family  History    Family History  Problem Relation Age of Onset   Stroke Father    Hypertension Father    Diabetes Brother    Esophageal cancer Brother    Stroke Maternal Grandmother    Stroke Maternal Grandfather    Stomach cancer Maternal  Aunt    Colon cancer Neg Hx    Rectal cancer Neg Hx    He indicated that his father is deceased. He indicated that his brother is deceased. He indicated that the status of his maternal grandmother is unknown. He indicated that the status of his maternal grandfather is unknown. He indicated that the status of his maternal aunt is unknown. He indicated that the status of his neg hx is unknown.  Social History    Social History   Socioeconomic History   Marital status: Married    Spouse name: Not on file   Number of children: 1   Years of education: Not on file   Highest education level: Not on file  Occupational History   Not on file  Tobacco Use   Smoking status: Former    Current packs/day: 0.00    Types: Cigarettes    Quit date: 11/04/1985    Years since quitting: 37.4   Smokeless tobacco: Never  Vaping Use   Vaping status: Never Used  Substance and Sexual Activity   Alcohol use: Not Currently   Drug use: No   Sexual activity: Not on file  Other Topics Concern   Not on file  Social History Narrative   Not on file   Social Drivers of Health   Financial Resource Strain: Low Risk  (08/24/2022)   Overall Financial Resource Strain (CARDIA)    Difficulty of Paying Living Expenses: Not hard at all  Food Insecurity: No Food Insecurity (03/22/2023)   Hunger Vital Sign    Worried About Running Out of Food in the Last Year: Never true    Ran Out of Food in the Last Year: Never true  Transportation Needs: No Transportation Needs (03/22/2023)   PRAPARE - Administrator, Civil Service (Medical): No    Lack of Transportation (Non-Medical): No  Physical Activity: Sufficiently Active (07/10/2021)   Exercise Vital Sign    Days of  Exercise per Week: 5 days    Minutes of Exercise per Session: 30 min  Stress: No Stress Concern Present (08/24/2022)   [employer] of Occupational Health - Occupational Stress Questionnaire    Feeling of Stress : Only a little  Social Connections: Moderately Integrated (03/22/2023)   Social Connection and Isolation Panel [NHANES]    Frequency of Communication with Friends and Family: Twice a week    Frequency of Social Gatherings with Friends and Family: Twice a week    Attends Religious Services: More than 4 times per year    Active Member of [employer] or Organizations: No    Attends Banker Meetings: Never    Marital Status: Married  Catering manager Violence: Not At Risk (03/22/2023)   Humiliation, Afraid, Rape, and Kick questionnaire    Fear of Current or Ex-Partner: No    Emotionally Abused: No    Physically Abused: No    Sexually Abused: No     Review of Systems    General:  No chills, fever, night sweats or weight changes.  Cardiovascular:  No chest pain, dyspnea on exertion, edema, orthopnea, palpitations, paroxysmal nocturnal dyspnea. Dermatological: No rash, lesions/masses Respiratory: No cough, dyspnea Urologic: No hematuria, dysuria Abdominal:   No nausea, vomiting, diarrhea, bright red blood per rectum, melena, or hematemesis Neurologic:  No visual changes, wkns, changes in mental status. All other systems reviewed and are otherwise negative except as noted above.  Physical Exam    VS:  BP (!) 93/58 (BP Location: Left Wrist,  Patient Position: Sitting)   Pulse (!) 113   Ht 5\' 8"  (1.727 m)   Wt 149 lb (67.6 kg)   SpO2 100%   BMI 22.66 kg/m  , BMI Body mass index is 22.66 kg/m. GEN: Well nourished, well developed, in no acute distress. HEENT: normal. Neck: Supple, no JVD, carotid bruits, or masses. Cardiac: RRR, no murmurs, rubs, or gallops. No clubbing, cyanosis, left ankle generalized edema.  Radials/DP/PT 2+ and equal bilaterally.  Respiratory:   Respirations regular and unlabored, clear to auscultation bilaterally. GI: Soft, nontender, nondistended, BS + x 4. MS: no deformity or atrophy. Skin: warm and dry, no rash. Neuro:  Strength and sensation are intact. Psych: Normal affect.  Accessory Clinical Findings    Recent Labs: 08/14/2022: TSH 1.145 11/23/2022: Brain Natriuretic Peptide 390 02/14/2023: Magnesium 2.1 03/07/2023: ALT 14 03/22/2023: BUN 30; Creatinine, Ser 2.36; Hemoglobin 10.6; Platelets 336; Potassium 4.0; Sodium 133   Recent Lipid Panel    Component Value Date/Time   CHOL 151 12/02/2022 1502   CHOL 128 08/17/2012 0813   TRIG 101.0 12/02/2022 1502   TRIG 69 08/17/2012 0813   HDL 54.60 12/02/2022 1502   HDL 56 08/17/2012 0813   CHOLHDL 3 12/02/2022 1502   VLDL 20.2 12/02/2022 1502   LDLCALC 76 12/02/2022 1502   LDLCALC 46 05/14/2022 0836   LDLCALC 58 08/17/2012 0813         ECG personally reviewed by me today- EKG Interpretation Date/Time:  Thursday April 07 2023 15:17:16 EDT Ventricular Rate:  117 PR Interval:    QRS Duration:  150 QT Interval:  396 QTC Calculation: 552 R Axis:   -54  Text Interpretation: Atrial fibrillation Left axis deviation Right bundle branch block Left ventricular hypertrophy with repolarization abnormality ( R in aVL ) Inferior infarct (cited on or before 16-Mar-2023) Anterolateral infarct (cited on or before 16-Mar-2023) When compared with ECG of 07-Apr-2023 14:41, Current undetermined rhythm precludes rhythm comparison, needs review Questionable change in initial forces of Anterior leads Confirmed by Edd Fabian [id number]) on 04/07/2023 4:31:22 PM   Nuclear stress test 07/18/2018 Nuclear stress EF: 51%. There was no ST segment deviation noted during stress. PVCs noted at rest and with stress. Defect 1: There is a medium defect of severe severity present in the basal inferior, basal inferolateral, mid inferior, mid inferolateral, apical inferior and apex location. Findings  consistent with prior myocardial infarction. This is an intermediate risk study. The left ventricular ejection fraction is mildly decreased (45-54%).   Compared to the prior report, no significant change has occurred. No images available to review.  TEE 08/16/2022  IMPRESSIONS     1. Left ventricular ejection fraction, by estimation, is 25 to 30%. The  left ventricle has severely decreased function. The left ventricle  demonstrates global hypokinesis.   2. Right ventricular systolic function is normal. The right ventricular  size is normal.   3. Left atrial size was moderately dilated. No left atrial/left atrial  appendage thrombus was detected.   4. The mitral valve is normal in structure. Moderate to severe mitral  valve regurgitation. No evidence of mitral stenosis.   5. The aortic valve is tricuspid. Aortic valve regurgitation is not  visualized. No aortic stenosis is present.   6. The inferior vena cava is normal in size with greater than 50%  respiratory variability, suggesting right atrial pressure of 3 mmHg.   FINDINGS   Left Ventricle: Left ventricular ejection fraction, by estimation, is 25  to 30%. The left ventricle  has severely decreased function. The left  ventricle demonstrates global hypokinesis. The left ventricular internal  cavity size was normal in size. There  is no left ventricular hypertrophy.   Right Ventricle: The right ventricular size is normal. No increase in  right ventricular wall thickness. Right ventricular systolic function is  normal.   Left Atrium: Left atrial size was moderately dilated. No left atrial/left  atrial appendage thrombus was detected.   Right Atrium: Right atrial size was normal in size.   Pericardium: There is no evidence of pericardial effusion.   Mitral Valve: The mitral valve is normal in structure. Moderate to severe  mitral valve regurgitation. No evidence of mitral valve stenosis.   Tricuspid Valve: The tricuspid  valve is normal in structure. Tricuspid  valve regurgitation is mild . No evidence of tricuspid stenosis.   Aortic Valve: The aortic valve is tricuspid. Aortic valve regurgitation is  not visualized. No aortic stenosis is present.   Pulmonic Valve: The pulmonic valve was normal in structure. Pulmonic valve  regurgitation is not visualized. No evidence of pulmonic stenosis.   Aorta: The aortic root is normal in size and structure.   Venous: The inferior vena cava is normal in size with greater than 50%  respiratory variability, suggesting right atrial pressure of 3 mmHg.   IAS/Shunts: No atrial level shunt detected by color flow Doppler.   [name] MD  Electronically signed by [name] MD  Signature Date/Time: 08/16/2022/11:23:05 AM        Final     Assessment & Plan   1. Persistent atrial fibrillation-EKG today shows atrial fibrillation 113 bpm.  Reports compliance with apixaban.  He continues to be cardiac unaware.  Fatigue stable.  He denies chest pain or shortness of breath at this time. Continue apixaban, amiodarone Continue metoprolol to 75 mg twice daily Avoid triggers caffeine, chocolate, EtOH, dehydration etc. Refer to A-fib clinic  Chronic systolic CHF-  EF pre-CABG 45-50%.  Subsequent TEE showed LVEF of 25-30%.  Was noted to have moderately reduced RV function, moderate left atrial enlargement and mild right atrial enlargement, moderate mitral regurgitation was also noted.  Ischemic evaluation with Myoview in 2020 showed no ischemia and LVEF of 51%.  Previously discussed proceeding to repeat ischemic evaluation in the near future.  Up titration GDMT limited due to blood pressure. Continue furosemide, metoprolol, spironolactone, Jardiance Heart healthy low-sodium diet Will plan for repeat echocardiogram once GDMT has been optimized.  Coronary artery disease-denies anginal symptoms.  Status post CABG in 2000. Continue metoprolol, aspirin, Plavix,  rosuvastatin   Disposition: Follow-up with Dr. Rennis Golden or me in 3 months.   Thomasene Ripple. Giorgia Wahler NP-C     04/07/2023, 4:31 PM [facility] [address] Office [phone] Fax [phone]    I spent 15 minutes examining this patient, reviewing medications, and using patient centered shared decision making involving their cardiac care.   I spent  20 minutes reviewing past medical history,  medications, and prior cardiac tests.

## 2023-04-05 ENCOUNTER — Telehealth (INDEPENDENT_AMBULATORY_CARE_PROVIDER_SITE_OTHER): Payer: Self-pay | Admitting: Neurosurgery

## 2023-04-05 DIAGNOSIS — I671 Cerebral aneurysm, nonruptured: Secondary | ICD-10-CM

## 2023-04-05 DIAGNOSIS — E1152 Type 2 diabetes mellitus with diabetic peripheral angiopathy with gangrene: Secondary | ICD-10-CM | POA: Diagnosis not present

## 2023-04-05 DIAGNOSIS — Z48812 Encounter for surgical aftercare following surgery on the circulatory system: Secondary | ICD-10-CM | POA: Diagnosis not present

## 2023-04-05 DIAGNOSIS — L02612 Cutaneous abscess of left foot: Secondary | ICD-10-CM | POA: Diagnosis not present

## 2023-04-05 DIAGNOSIS — L03116 Cellulitis of left lower limb: Secondary | ICD-10-CM | POA: Diagnosis not present

## 2023-04-05 DIAGNOSIS — L97521 Non-pressure chronic ulcer of other part of left foot limited to breakdown of skin: Secondary | ICD-10-CM | POA: Diagnosis not present

## 2023-04-05 DIAGNOSIS — I872 Venous insufficiency (chronic) (peripheral): Secondary | ICD-10-CM | POA: Diagnosis not present

## 2023-04-05 NOTE — Telephone Encounter (Signed)
 I spoke with Donald Rios regarding his scans.  The findings are stable.  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  He is asymptomatic from this.  We discussed the utility of annual scans.  He has declined further scans, will let me know if you would like to pursue any any additional scans in the future.    I spent a total of 1 minute in this patient's care today. This time was spent reviewing pertinent records including imaging studies, obtaining and confirming history, performing a directed evaluation, formulating and discussing my recommendations, and documenting the visit within the medical record.

## 2023-04-07 ENCOUNTER — Telehealth: Payer: Self-pay

## 2023-04-07 ENCOUNTER — Ambulatory Visit: Attending: General Practice | Admitting: General Practice

## 2023-04-07 ENCOUNTER — Other Ambulatory Visit (HOSPITAL_COMMUNITY): Payer: Self-pay

## 2023-04-07 ENCOUNTER — Encounter: Payer: Self-pay | Admitting: General Practice

## 2023-04-07 ENCOUNTER — Other Ambulatory Visit: Payer: Self-pay | Admitting: Family Medicine

## 2023-04-07 ENCOUNTER — Other Ambulatory Visit: Payer: Self-pay

## 2023-04-07 VITALS — BP 93/58 | HR 113 | Ht 68.0 in | Wt 149.0 lb

## 2023-04-07 DIAGNOSIS — I2510 Atherosclerotic heart disease of native coronary artery without angina pectoris: Secondary | ICD-10-CM | POA: Diagnosis not present

## 2023-04-07 DIAGNOSIS — I5022 Chronic systolic (congestive) heart failure: Secondary | ICD-10-CM | POA: Diagnosis not present

## 2023-04-07 DIAGNOSIS — I4819 Other persistent atrial fibrillation: Secondary | ICD-10-CM

## 2023-04-07 DIAGNOSIS — E1169 Type 2 diabetes mellitus with other specified complication: Secondary | ICD-10-CM

## 2023-04-07 MED ORDER — FIASP FLEXTOUCH 100 UNIT/ML ~~LOC~~ SOPN
PEN_INJECTOR | SUBCUTANEOUS | 5 refills | Status: DC
  Filled 2023-04-07: qty 9, 30d supply, fill #0

## 2023-04-07 NOTE — Patient Instructions (Addendum)
 Medication Instructions:  NONE *If you need a refill on your cardiac medications before your next appointment, please call your pharmacy*  Lab Work: NONE If you have labs (blood work) drawn today and your tests are completely normal, you will receive your results only by:  MyChart Message (if you have MyChart) OR  A paper copy in the mail If you have any lab test that is abnormal or we need to change your treatment, we will call you to review the results.  Testing/Procedures: NONE  Follow-Up: At [facility], you and your health needs are our priority.  As part of our continuing mission to provide you with exceptional heart care, our providers are all part of one team.  This team includes your primary Cardiologist (physician) and Advanced Practice Providers or APPs (Physician Assistants and Nurse Practitioners) who all work together to provide you with the care you need, when you need it.  Your next appointment:   3-4 month(s)  Provider:   Chrystie Nose, MD  or Edd Fabian, FNP        We recommend signing up for the patient portal called "MyChart".  Sign up information is provided on this After Visit Summary.  MyChart is used to connect with patients for Virtual Visits (Telemedicine).  Patients are able to view lab/test results, encounter notes, upcoming appointments, etc.  Non-urgent messages can be sent to your provider as well.   To learn more about what you can do with MyChart, go to [email or web address].   Other Instructions REFERRAL TO ATRIAL FIBRILLATION CLINIC  Please try to avoid these triggers: Do not use any products that have nicotine or tobacco in them. These include cigarettes, e-cigarettes, and chewing tobacco. If you need help quitting, ask your doctor. Eat heart-healthy foods. Talk with your doctor about the right eating plan for you. Exercise regularly as told by your doctor. Stay hydrated Do not drink alcohol, Caffeine or chocolate. Lose weight if  you are overweight. Do not use drugs, including cannabis         1st Floor: - Lobby - Registration  - Pharmacy  - Lab - Cafe  2nd Floor: - PV Lab - Diagnostic Testing (echo, CT, nuclear med)  3rd Floor: - Vacant  4th Floor: - TCTS (cardiothoracic surgery) - AFib Clinic - Structural Heart Clinic - Vascular Surgery  - Vascular Ultrasound  5th Floor: - HeartCare Cardiology (general and EP) - Clinical Pharmacy for coumadin, hypertension, lipid, weight-loss medications, and med management appointments    Valet parking services will be available as well.

## 2023-04-07 NOTE — Telephone Encounter (Signed)
 Copied from CRM [id number]. Topic: Clinical - Prescription Issue >> Apr 07, 2023  1:39 PM DeAngela L wrote: Reason for CRM: [facility] pharm calling about insulin aspart (FIASP FLEXTOUCH) 100 UNIT/ML FlexTouch Pen Waiting for a response from for request this morning sent to Dr office to refill prescription The patient is out medication  please please call pharmacy back states they can take a verbal request over the phone also [phone]

## 2023-04-08 ENCOUNTER — Other Ambulatory Visit (HOSPITAL_COMMUNITY): Payer: Self-pay

## 2023-04-12 ENCOUNTER — Observation Stay (HOSPITAL_COMMUNITY)

## 2023-04-12 ENCOUNTER — Emergency Department (HOSPITAL_COMMUNITY)

## 2023-04-12 ENCOUNTER — Inpatient Hospital Stay (HOSPITAL_COMMUNITY)
Admission: EM | Admit: 2023-04-12 | Discharge: 2023-04-19 | DRG: 564 | Disposition: A | Discharge status: Skilled Nursing Facility | Attending: Internal Medicine | Admitting: Internal Medicine

## 2023-04-12 ENCOUNTER — Ambulatory Visit (HOSPITAL_COMMUNITY): Admitting: Internal Medicine

## 2023-04-12 ENCOUNTER — Other Ambulatory Visit: Payer: Self-pay

## 2023-04-12 ENCOUNTER — Encounter (HOSPITAL_COMMUNITY): Payer: Self-pay

## 2023-04-12 ENCOUNTER — Encounter

## 2023-04-12 DIAGNOSIS — S22088D Other fracture of T11-T12 vertebra, subsequent encounter for fracture with routine healing: Secondary | ICD-10-CM | POA: Diagnosis not present

## 2023-04-12 DIAGNOSIS — R918 Other nonspecific abnormal finding of lung field: Secondary | ICD-10-CM | POA: Diagnosis not present

## 2023-04-12 DIAGNOSIS — I4819 Other persistent atrial fibrillation: Secondary | ICD-10-CM | POA: Diagnosis not present

## 2023-04-12 DIAGNOSIS — E785 Hyperlipidemia, unspecified: Secondary | ICD-10-CM | POA: Diagnosis present

## 2023-04-12 DIAGNOSIS — Z452 Encounter for adjustment and management of vascular access device: Secondary | ICD-10-CM | POA: Diagnosis not present

## 2023-04-12 DIAGNOSIS — E1151 Type 2 diabetes mellitus with diabetic peripheral angiopathy without gangrene: Secondary | ICD-10-CM | POA: Diagnosis present

## 2023-04-12 DIAGNOSIS — R6520 Severe sepsis without septic shock: Principal | ICD-10-CM | POA: Diagnosis present

## 2023-04-12 DIAGNOSIS — Z89422 Acquired absence of other left toe(s): Secondary | ICD-10-CM

## 2023-04-12 DIAGNOSIS — I509 Heart failure, unspecified: Secondary | ICD-10-CM | POA: Diagnosis not present

## 2023-04-12 DIAGNOSIS — L89629 Pressure ulcer of left heel, unspecified stage: Secondary | ICD-10-CM | POA: Diagnosis present

## 2023-04-12 DIAGNOSIS — E0821 Diabetes mellitus due to underlying condition with diabetic nephropathy: Secondary | ICD-10-CM | POA: Diagnosis not present

## 2023-04-12 DIAGNOSIS — S22089D Unspecified fracture of T11-T12 vertebra, subsequent encounter for fracture with routine healing: Secondary | ICD-10-CM

## 2023-04-12 DIAGNOSIS — E43 Unspecified severe protein-calorie malnutrition: Secondary | ICD-10-CM | POA: Diagnosis present

## 2023-04-12 DIAGNOSIS — E876 Hypokalemia: Secondary | ICD-10-CM | POA: Diagnosis present

## 2023-04-12 DIAGNOSIS — I480 Paroxysmal atrial fibrillation: Secondary | ICD-10-CM | POA: Diagnosis not present

## 2023-04-12 DIAGNOSIS — D849 Immunodeficiency, unspecified: Secondary | ICD-10-CM | POA: Diagnosis present

## 2023-04-12 DIAGNOSIS — L089 Local infection of the skin and subcutaneous tissue, unspecified: Secondary | ICD-10-CM | POA: Diagnosis not present

## 2023-04-12 DIAGNOSIS — J4489 Other specified chronic obstructive pulmonary disease: Secondary | ICD-10-CM | POA: Diagnosis present

## 2023-04-12 DIAGNOSIS — I739 Peripheral vascular disease, unspecified: Secondary | ICD-10-CM | POA: Diagnosis not present

## 2023-04-12 DIAGNOSIS — I959 Hypotension, unspecified: Secondary | ICD-10-CM | POA: Diagnosis not present

## 2023-04-12 DIAGNOSIS — W19XXXD Unspecified fall, subsequent encounter: Secondary | ICD-10-CM | POA: Diagnosis present

## 2023-04-12 DIAGNOSIS — E11649 Type 2 diabetes mellitus with hypoglycemia without coma: Secondary | ICD-10-CM | POA: Diagnosis present

## 2023-04-12 DIAGNOSIS — K5901 Slow transit constipation: Secondary | ICD-10-CM | POA: Diagnosis not present

## 2023-04-12 DIAGNOSIS — A419 Sepsis, unspecified organism: Principal | ICD-10-CM | POA: Diagnosis present

## 2023-04-12 DIAGNOSIS — E1122 Type 2 diabetes mellitus with diabetic chronic kidney disease: Secondary | ICD-10-CM | POA: Diagnosis not present

## 2023-04-12 DIAGNOSIS — M19072 Primary osteoarthritis, left ankle and foot: Secondary | ICD-10-CM | POA: Diagnosis not present

## 2023-04-12 DIAGNOSIS — R578 Other shock: Secondary | ICD-10-CM | POA: Diagnosis not present

## 2023-04-12 DIAGNOSIS — L0390 Cellulitis, unspecified: Secondary | ICD-10-CM | POA: Diagnosis not present

## 2023-04-12 DIAGNOSIS — Z7984 Long term (current) use of oral hypoglycemic drugs: Secondary | ICD-10-CM

## 2023-04-12 DIAGNOSIS — Z794 Long term (current) use of insulin: Secondary | ICD-10-CM

## 2023-04-12 DIAGNOSIS — Z79620 Long term (current) use of immunosuppressive biologic: Secondary | ICD-10-CM

## 2023-04-12 DIAGNOSIS — I13 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, or unspecified chronic kidney disease: Secondary | ICD-10-CM | POA: Diagnosis present

## 2023-04-12 DIAGNOSIS — Z8249 Family history of ischemic heart disease and other diseases of the circulatory system: Secondary | ICD-10-CM

## 2023-04-12 DIAGNOSIS — M722 Plantar fascial fibromatosis: Secondary | ICD-10-CM | POA: Diagnosis not present

## 2023-04-12 DIAGNOSIS — Z7982 Long term (current) use of aspirin: Secondary | ICD-10-CM

## 2023-04-12 DIAGNOSIS — K219 Gastro-esophageal reflux disease without esophagitis: Secondary | ICD-10-CM | POA: Diagnosis not present

## 2023-04-12 DIAGNOSIS — I255 Ischemic cardiomyopathy: Secondary | ICD-10-CM | POA: Diagnosis present

## 2023-04-12 DIAGNOSIS — M316 Other giant cell arteritis: Secondary | ICD-10-CM | POA: Diagnosis present

## 2023-04-12 DIAGNOSIS — R000 Tachycardia, unspecified: Secondary | ICD-10-CM | POA: Diagnosis not present

## 2023-04-12 DIAGNOSIS — E872 Acidosis, unspecified: Secondary | ICD-10-CM | POA: Diagnosis present

## 2023-04-12 DIAGNOSIS — N189 Chronic kidney disease, unspecified: Secondary | ICD-10-CM | POA: Diagnosis present

## 2023-04-12 DIAGNOSIS — L03116 Cellulitis of left lower limb: Secondary | ICD-10-CM | POA: Diagnosis present

## 2023-04-12 DIAGNOSIS — E782 Mixed hyperlipidemia: Secondary | ICD-10-CM | POA: Diagnosis not present

## 2023-04-12 DIAGNOSIS — I4891 Unspecified atrial fibrillation: Secondary | ICD-10-CM | POA: Diagnosis not present

## 2023-04-12 DIAGNOSIS — Z823 Family history of stroke: Secondary | ICD-10-CM

## 2023-04-12 DIAGNOSIS — D631 Anemia in chronic kidney disease: Secondary | ICD-10-CM | POA: Diagnosis present

## 2023-04-12 DIAGNOSIS — K5900 Constipation, unspecified: Secondary | ICD-10-CM | POA: Diagnosis present

## 2023-04-12 DIAGNOSIS — R6889 Other general symptoms and signs: Secondary | ICD-10-CM | POA: Diagnosis not present

## 2023-04-12 DIAGNOSIS — I5022 Chronic systolic (congestive) heart failure: Secondary | ICD-10-CM | POA: Diagnosis present

## 2023-04-12 DIAGNOSIS — J9811 Atelectasis: Secondary | ICD-10-CM | POA: Diagnosis not present

## 2023-04-12 DIAGNOSIS — J90 Pleural effusion, not elsewhere classified: Secondary | ICD-10-CM | POA: Diagnosis not present

## 2023-04-12 DIAGNOSIS — G9341 Metabolic encephalopathy: Secondary | ICD-10-CM | POA: Diagnosis present

## 2023-04-12 DIAGNOSIS — E119 Type 2 diabetes mellitus without complications: Secondary | ICD-10-CM | POA: Diagnosis not present

## 2023-04-12 DIAGNOSIS — I10 Essential (primary) hypertension: Secondary | ICD-10-CM | POA: Diagnosis present

## 2023-04-12 DIAGNOSIS — M65972 Unspecified synovitis and tenosynovitis, left ankle and foot: Secondary | ICD-10-CM | POA: Diagnosis not present

## 2023-04-12 DIAGNOSIS — U071 COVID-19: Secondary | ICD-10-CM | POA: Diagnosis not present

## 2023-04-12 DIAGNOSIS — M6281 Muscle weakness (generalized): Secondary | ICD-10-CM | POA: Diagnosis not present

## 2023-04-12 DIAGNOSIS — I482 Chronic atrial fibrillation, unspecified: Secondary | ICD-10-CM | POA: Diagnosis not present

## 2023-04-12 DIAGNOSIS — I2510 Atherosclerotic heart disease of native coronary artery without angina pectoris: Secondary | ICD-10-CM | POA: Diagnosis present

## 2023-04-12 DIAGNOSIS — I25810 Atherosclerosis of coronary artery bypass graft(s) without angina pectoris: Secondary | ICD-10-CM | POA: Diagnosis present

## 2023-04-12 DIAGNOSIS — D649 Anemia, unspecified: Secondary | ICD-10-CM | POA: Diagnosis not present

## 2023-04-12 DIAGNOSIS — R600 Localized edema: Secondary | ICD-10-CM | POA: Diagnosis not present

## 2023-04-12 DIAGNOSIS — Z89429 Acquired absence of other toe(s), unspecified side: Secondary | ICD-10-CM | POA: Diagnosis not present

## 2023-04-12 DIAGNOSIS — Z6822 Body mass index (BMI) 22.0-22.9, adult: Secondary | ICD-10-CM

## 2023-04-12 DIAGNOSIS — E871 Hypo-osmolality and hyponatremia: Secondary | ICD-10-CM | POA: Diagnosis present

## 2023-04-12 DIAGNOSIS — R2689 Other abnormalities of gait and mobility: Secondary | ICD-10-CM | POA: Diagnosis not present

## 2023-04-12 DIAGNOSIS — J069 Acute upper respiratory infection, unspecified: Secondary | ICD-10-CM | POA: Diagnosis present

## 2023-04-12 DIAGNOSIS — K8020 Calculus of gallbladder without cholecystitis without obstruction: Secondary | ICD-10-CM | POA: Diagnosis not present

## 2023-04-12 DIAGNOSIS — M109 Gout, unspecified: Secondary | ICD-10-CM | POA: Diagnosis present

## 2023-04-12 DIAGNOSIS — R54 Age-related physical debility: Secondary | ICD-10-CM | POA: Diagnosis present

## 2023-04-12 DIAGNOSIS — R0989 Other specified symptoms and signs involving the circulatory and respiratory systems: Secondary | ICD-10-CM | POA: Diagnosis not present

## 2023-04-12 DIAGNOSIS — L97521 Non-pressure chronic ulcer of other part of left foot limited to breakdown of skin: Secondary | ICD-10-CM | POA: Diagnosis not present

## 2023-04-12 DIAGNOSIS — N179 Acute kidney failure, unspecified: Secondary | ICD-10-CM | POA: Diagnosis not present

## 2023-04-12 DIAGNOSIS — E1169 Type 2 diabetes mellitus with other specified complication: Secondary | ICD-10-CM

## 2023-04-12 DIAGNOSIS — M7732 Calcaneal spur, left foot: Secondary | ICD-10-CM | POA: Diagnosis not present

## 2023-04-12 DIAGNOSIS — I252 Old myocardial infarction: Secondary | ICD-10-CM

## 2023-04-12 DIAGNOSIS — I5020 Unspecified systolic (congestive) heart failure: Secondary | ICD-10-CM | POA: Diagnosis not present

## 2023-04-12 DIAGNOSIS — E162 Hypoglycemia, unspecified: Secondary | ICD-10-CM | POA: Diagnosis not present

## 2023-04-12 DIAGNOSIS — Z87891 Personal history of nicotine dependence: Secondary | ICD-10-CM

## 2023-04-12 DIAGNOSIS — M85872 Other specified disorders of bone density and structure, left ankle and foot: Secondary | ICD-10-CM | POA: Diagnosis not present

## 2023-04-12 DIAGNOSIS — E559 Vitamin D deficiency, unspecified: Secondary | ICD-10-CM | POA: Diagnosis present

## 2023-04-12 DIAGNOSIS — T8744 Infection of amputation stump, left lower extremity: Secondary | ICD-10-CM | POA: Diagnosis present

## 2023-04-12 DIAGNOSIS — R278 Other lack of coordination: Secondary | ICD-10-CM | POA: Diagnosis not present

## 2023-04-12 DIAGNOSIS — Z951 Presence of aortocoronary bypass graft: Secondary | ICD-10-CM

## 2023-04-12 DIAGNOSIS — I499 Cardiac arrhythmia, unspecified: Secondary | ICD-10-CM | POA: Diagnosis not present

## 2023-04-12 DIAGNOSIS — Z7401 Bed confinement status: Secondary | ICD-10-CM | POA: Diagnosis not present

## 2023-04-12 DIAGNOSIS — Z79899 Other long term (current) drug therapy: Secondary | ICD-10-CM

## 2023-04-12 DIAGNOSIS — R531 Weakness: Secondary | ICD-10-CM | POA: Diagnosis not present

## 2023-04-12 DIAGNOSIS — Z7901 Long term (current) use of anticoagulants: Secondary | ICD-10-CM

## 2023-04-12 LAB — CBG MONITORING, ED
Glucose-Capillary: 135 mg/dL — ABNORMAL HIGH (ref 70–99)
Glucose-Capillary: 144 mg/dL — ABNORMAL HIGH (ref 70–99)
Glucose-Capillary: 128 mg/dL — ABNORMAL HIGH (ref 70–99)
Glucose-Capillary: 109 mg/dL — ABNORMAL HIGH (ref 70–99)

## 2023-04-12 LAB — COMPREHENSIVE METABOLIC PANEL WITH GFR
ALT: 34 U/L (ref 0–44)
AST: 67 U/L — ABNORMAL HIGH (ref 15–41)
Albumin: 2.3 g/dL — ABNORMAL LOW (ref 3.5–5.0)
Alkaline Phosphatase: 139 U/L — ABNORMAL HIGH (ref 38–126)
Anion gap: 17 — ABNORMAL HIGH (ref 5–15)
BUN: 34 mg/dL — ABNORMAL HIGH (ref 8–23)
CO2: 19 mmol/L — ABNORMAL LOW (ref 22–32)
Calcium: 8.8 mg/dL — ABNORMAL LOW (ref 8.9–10.3)
Chloride: 98 mmol/L (ref 98–111)
Creatinine, Ser: 2.65 mg/dL — ABNORMAL HIGH (ref 0.61–1.24)
GFR, Estimated: 23 mL/min — ABNORMAL LOW (ref >60)
Glucose, Bld: 199 mg/dL — ABNORMAL HIGH (ref 70–99)
Potassium: 3.6 mmol/L (ref 3.5–5.1)
Sodium: 134 mmol/L — ABNORMAL LOW (ref 135–145)
Total Bilirubin: 0.9 mg/dL (ref 0.0–1.2)
Total Protein: 6.1 g/dL — ABNORMAL LOW (ref 6.5–8.1)

## 2023-04-12 LAB — PROTIME-INR
INR: 1.7 — ABNORMAL HIGH (ref 0.8–1.2)
Prothrombin Time: 19.8 s — ABNORMAL HIGH (ref 11.4–15.2)

## 2023-04-12 LAB — GLUCOSE, CAPILLARY
Glucose-Capillary: 53 mg/dL — ABNORMAL LOW (ref 70–99)
Glucose-Capillary: 167 mg/dL — ABNORMAL HIGH (ref 70–99)
Glucose-Capillary: 119 mg/dL — ABNORMAL HIGH (ref 70–99)
Glucose-Capillary: 71 mg/dL (ref 70–99)

## 2023-04-12 LAB — CBC WITH DIFFERENTIAL/PLATELET
Abs Immature Granulocytes: 0.15 10*3/uL — ABNORMAL HIGH (ref 0.00–0.07)
Basophils Absolute: 0 10*3/uL (ref 0.0–0.1)
Basophils Relative: 0 %
Eosinophils Absolute: 0 10*3/uL (ref 0.0–0.5)
Eosinophils Relative: 0 %
HCT: 32.2 % — ABNORMAL LOW (ref 39.0–52.0)
Hemoglobin: 9.9 g/dL — ABNORMAL LOW (ref 13.0–17.0)
Immature Granulocytes: 1 %
Lymphocytes Relative: 2 %
Lymphs Abs: 0.4 10*3/uL — ABNORMAL LOW (ref 0.7–4.0)
MCH: 32 pg (ref 26.0–34.0)
MCHC: 30.7 g/dL (ref 30.0–36.0)
MCV: 104.2 fL — ABNORMAL HIGH (ref 80.0–100.0)
Monocytes Absolute: 0.5 10*3/uL (ref 0.1–1.0)
Monocytes Relative: 3 %
Neutro Abs: 16.2 10*3/uL — ABNORMAL HIGH (ref 1.7–7.7)
Neutrophils Relative %: 94 %
Platelets: 228 10*3/uL (ref 150–400)
RBC: 3.09 MIL/uL — ABNORMAL LOW (ref 4.22–5.81)
RDW: 18.3 % — ABNORMAL HIGH (ref 11.5–15.5)
Smear Review: UNDETERMINED
WBC: 17.2 10*3/uL — ABNORMAL HIGH (ref 4.0–10.5)
nRBC: 0.2 % (ref 0.0–0.2)

## 2023-04-12 LAB — I-STAT CG4 LACTIC ACID, ED
Lactic Acid, Venous: 4.6 mmol/L (ref 0.5–1.9)
Lactic Acid, Venous: 2.7 mmol/L (ref 0.5–1.9)
Lactic Acid, Venous: 1.7 mmol/L (ref 0.5–1.9)

## 2023-04-12 LAB — RESP PANEL BY RT-PCR (RSV, FLU A&B, COVID)  RVPGX2
Influenza A by PCR: NEGATIVE
Influenza B by PCR: NEGATIVE
Resp Syncytial Virus by PCR: NEGATIVE
SARS Coronavirus 2 by RT PCR: POSITIVE — AB

## 2023-04-12 LAB — I-STAT CHEM 8, ED
BUN: 31 mg/dL — ABNORMAL HIGH (ref 8–23)
Calcium, Ion: 1.08 mmol/L — ABNORMAL LOW (ref 1.15–1.40)
Chloride: 99 mmol/L (ref 98–111)
Creatinine, Ser: 2.6 mg/dL — ABNORMAL HIGH (ref 0.61–1.24)
Glucose, Bld: 195 mg/dL — ABNORMAL HIGH (ref 70–99)
HCT: 32 % — ABNORMAL LOW (ref 39.0–52.0)
Hemoglobin: 10.9 g/dL — ABNORMAL LOW (ref 13.0–17.0)
Potassium: 3.5 mmol/L (ref 3.5–5.1)
Sodium: 132 mmol/L — ABNORMAL LOW (ref 135–145)
TCO2: 21 mmol/L — ABNORMAL LOW (ref 22–32)

## 2023-04-12 LAB — APTT: aPTT: 39 s — ABNORMAL HIGH (ref 24–36)

## 2023-04-12 MED ORDER — LACTATED RINGERS IV SOLN: INTRAVENOUS | Status: AC

## 2023-04-12 MED ORDER — VANCOMYCIN HCL IN DEXTROSE 1-5 GM/200ML-% IV SOLN
1000.0000 mg | INTRAVENOUS | Status: DC
  Filled 2023-04-12: qty 200

## 2023-04-12 MED ORDER — SODIUM CHLORIDE 0.9 % IV BOLUS
1000.0000 mL | Freq: Once | INTRAVENOUS | Status: AC
  Administered 2023-04-12: 1000 mL via INTRAVENOUS

## 2023-04-12 MED ORDER — ACETAMINOPHEN 325 MG PO TABS
650.0000 mg | ORAL_TABLET | Freq: Four times a day (QID) | ORAL | Status: DC | PRN
  Administered 2023-04-13 – 2023-04-16 (×2): 650 mg via ORAL
  Filled 2023-04-12 (×2): qty 2

## 2023-04-12 MED ORDER — SODIUM CHLORIDE 0.9% FLUSH
3.0000 mL | Freq: Two times a day (BID) | INTRAVENOUS | Status: DC
  Administered 2023-04-12 – 2023-04-19 (×7): 3 mL via INTRAVENOUS

## 2023-04-12 MED ORDER — SODIUM CHLORIDE 0.9 % IV BOLUS
500.0000 mL | INTRAVENOUS | Status: AC
  Administered 2023-04-12: 500 mL via INTRAVENOUS

## 2023-04-12 MED ORDER — SODIUM CHLORIDE 0.9 % IV SOLN
2.0000 g | INTRAVENOUS | Status: DC
  Administered 2023-04-13 – 2023-04-14 (×2): 2 g via INTRAVENOUS
  Filled 2023-04-12 (×2): qty 12.5

## 2023-04-12 MED ORDER — METRONIDAZOLE 500 MG/100ML IV SOLN
500.0000 mg | Freq: Once | INTRAVENOUS | Status: AC
  Administered 2023-04-12: 500 mg via INTRAVENOUS
  Filled 2023-04-12: qty 100

## 2023-04-12 MED ORDER — POLYETHYLENE GLYCOL 3350 17 G PO PACK
17.0000 g | PACK | Freq: Every day | ORAL | Status: DC | PRN
  Administered 2023-04-15: 17 g via ORAL
  Filled 2023-04-12: qty 1

## 2023-04-12 MED ORDER — LACTATED RINGERS IV BOLUS
250.0000 mL | Freq: Once | INTRAVENOUS | Status: AC
Start: 2023-04-12 — End: 2023-04-12
  Administered 2023-04-12: 250 mL via INTRAVENOUS

## 2023-04-12 MED ORDER — METRONIDAZOLE 500 MG/100ML IV SOLN
500.0000 mg | Freq: Two times a day (BID) | INTRAVENOUS | Status: DC
  Administered 2023-04-13 – 2023-04-14 (×4): 500 mg via INTRAVENOUS
  Filled 2023-04-12 (×4): qty 100

## 2023-04-12 MED ORDER — ALBUMIN HUMAN 25 % IV SOLN
25.0000 g | INTRAVENOUS | Status: AC
  Administered 2023-04-12: 12.5 g via INTRAVENOUS
  Filled 2023-04-12: qty 100

## 2023-04-12 MED ORDER — AMIODARONE HCL 200 MG PO TABS
200.0000 mg | ORAL_TABLET | Freq: Every morning | ORAL | Status: DC
  Administered 2023-04-12 – 2023-04-19 (×8): 200 mg via ORAL
  Filled 2023-04-12 (×9): qty 1

## 2023-04-12 MED ORDER — SODIUM CHLORIDE 0.9 % IV BOLUS
500.0000 mL | Freq: Once | INTRAVENOUS | Status: AC
  Administered 2023-04-12: 500 mL via INTRAVENOUS

## 2023-04-12 MED ORDER — SODIUM CHLORIDE 0.9 % IV SOLN
2.0000 g | Freq: Once | INTRAVENOUS | Status: AC
  Administered 2023-04-12: 2 g via INTRAVENOUS
  Filled 2023-04-12: qty 12.5

## 2023-04-12 MED ORDER — SODIUM CHLORIDE 0.9 % IV BOLUS (SEPSIS)
500.0000 mL | Freq: Once | INTRAVENOUS | Status: AC
Start: 2023-04-12 — End: 2023-04-12
  Administered 2023-04-12: 500 mL via INTRAVENOUS

## 2023-04-12 MED ORDER — ACETAMINOPHEN 650 MG RE SUPP: 650.0000 mg | Freq: Four times a day (QID) | RECTAL | Status: DC | PRN

## 2023-04-12 MED ORDER — DEXTROSE 50 % IV SOLN
1.0000 | Freq: Once | INTRAVENOUS | Status: AC
  Administered 2023-04-12: 50 mL via INTRAVENOUS
  Filled 2023-04-12: qty 50

## 2023-04-12 MED ORDER — INSULIN ASPART 100 UNIT/ML IJ SOLN
0.0000 [IU] | Freq: Three times a day (TID) | INTRAMUSCULAR | Status: DC
  Administered 2023-04-15 – 2023-04-16 (×3): 2 [IU] via SUBCUTANEOUS
  Administered 2023-04-17 (×2): 1 [IU] via SUBCUTANEOUS
  Administered 2023-04-18: 2 [IU] via SUBCUTANEOUS
  Administered 2023-04-19: 3 [IU] via SUBCUTANEOUS
  Administered 2023-04-19: 1 [IU] via SUBCUTANEOUS

## 2023-04-12 MED ORDER — VANCOMYCIN HCL IN DEXTROSE 1-5 GM/200ML-% IV SOLN
1000.0000 mg | Freq: Once | INTRAVENOUS | Status: AC
  Administered 2023-04-12: 1000 mg via INTRAVENOUS
  Filled 2023-04-12: qty 200

## 2023-04-12 NOTE — ED Notes (Addendum)
 Pt resting comfortably at this time.

## 2023-04-12 NOTE — H&P (Signed)
 History and Physical   Donald Rios:[id number] DOB: [birth year] DOA: 04/12/2023  PCP: [name], MD   Patient coming from: Home  Chief Complaint: Acute encephalopathy, hypoglycemia  HPI: Donald Rios is a 83 y.o. male with medical history significant of hypertension, hyperlipidemia, CAD status post CABG, atrial fibrillation, GERD, diabetes, PAD, CHF, anemia, gout, COPD presenting with altered mental status and hypoglycemia.  Patient later woke up reporting feeling weak.  Apparently EMS was called due to altered mentation and facial droop.  On arrival noted to have CBG of 49.  D10 was administered with improvement of patient's glucose and mentation.  Patient reports some nausea and some decreased p.o. intake.  He did have his left fifth toe amputated a few weeks ago and reports he has been doing well since that time.  States he thinks he is on some antibiotics still.  Does have ongoing erythema/wound at left heel.  Also reportedly had some cough and bronchitis-like symptoms a couple weeks ago.  Denies fevers, chills, chest pain, shortness of breath, abdominal pain, constipation.  ED Course: Vital signs in the ED notable for Temperature of 97.5, blood pressure in the 80s-110 systolic, heart rate in the 100s-130s, respiratory rate in the teens-20s, on room air.  Lab workup included CMP with sodium 134, bicarb 19, BUN 34, creatinine elevated 2.65 which is similar to week ago but significantly increased from his baseline of 1 month ago, glucose 199, calcium 8.8, protein 6.1, albumin 2.3, AST 66, ALP 139.  CBC with chronic leukocytosis currently 17.2, hemoglobin 9.9 similar to previous but mildly reduced.  PT and INR elevated at 19.8 and 1.7 respectively PTT elevated at 39.  Lactic acid elevated to 4.6 and then 2.7 on repeat.  Respiratory panel positive for COVID.  Blood culture pending.  Urinalysis pending.  Chest x-ray without acute abnormality.  Left foot x-ray without acute bony  abnormality.  Patient received vancomycin, cefepime, Flagyl in the ED.  Also received a total of 2 L IV fluids.  Review of Systems: As per HPI otherwise all other systems reviewed and are negative.  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    History of MI (myocardial infarction)    History of nuclear stress test 08/07/2010   dipyridamole; EKG negative for ischemia, low risk scan    Hyperlipidemia    Hyperplastic colon polyp 05/2008   Hypertension    Ischemic cardiomyopathy    EF 45%, with inferior wall motion abnormality    Myocardial infarction (HCC)    Valvular regurgitation    mitral and tricuspid (mild)    Past Surgical History:  Procedure Laterality Date   ABDOMINAL AORTOGRAM W/LOWER EXTREMITY N/A 02/14/2023   Procedure: ABDOMINAL AORTOGRAM W/LOWER EXTREMITY;  Surgeon: Daria Pastures, MD;  Location: [facility] INVASIVE CV LAB;  Service: Cardiovascular;  Laterality: N/A;   AMPUTATION Left 03/22/2023   Procedure: FIFTH TOE AMPUTATION LEFT FOOT;  Surgeon: Daria Pastures, MD;  Location: [facility] OR;  Service: Vascular;  Laterality: Left;   CARDIOVERSION N/A 08/16/2022   Procedure: CARDIOVERSION;  Surgeon: Jake Bathe, MD;  Location: [facility] INVASIVE CV [facility];  Service: Cardiovascular;  Laterality: N/A;   CORONARY ARTERY BYPASS GRAFT  2000   LIMA to LAD, free IMA to OM2, sequential graft to PLA & PLD   ESOPHAGOGASTRODUODENOSCOPY N/A 05/07/2013   Procedure: ESOPHAGOGASTRODUODENOSCOPY (EGD);  Surgeon: Iva Boop, MD;  Location: [facility] ENDOSCOPY;  Service:  Endoscopy;  Laterality: N/A;   PERIPHERAL VASCULAR INTERVENTION Left 02/14/2023   Procedure: PERIPHERAL VASCULAR INTERVENTION;  Surgeon: Daria Pastures, MD;  Location: [facility] INVASIVE CV LAB;  Service: Cardiovascular;  Laterality: Left;  SFA   TEE WITHOUT CARDIOVERSION N/A 08/16/2022   Procedure: TRANSESOPHAGEAL ECHOCARDIOGRAM;  Surgeon: Jake Bathe, MD;  Location: [facility] INVASIVE CV LAB;  Service: Cardiovascular;  Laterality: N/A;   TRANSTHORACIC ECHOCARDIOGRAM  09/07/2010   EF 45-50%; LV systolic function mildly reduced; LA mildly dilated; mild-mod MR & mild-mod TR; aortic root sclerosis/calcification;     Social History  reports that he quit smoking about 37 years ago. His smoking use included cigarettes. He has never used smokeless tobacco. He reports that he does not currently use alcohol. He reports that he does not use drugs.  No Known Allergies  Family History  Problem Relation Age of Onset   Stroke Father    Hypertension Father    Diabetes Brother    Esophageal cancer Brother    Stroke Maternal Grandmother    Stroke Maternal Grandfather    Stomach cancer Maternal Aunt    Colon cancer Neg Hx    Rectal cancer Neg Hx   Reviewed on admission  Prior to Admission medications   Medication Sig Start Date End Date Taking? Authorizing Provider  allopurinol (ZYLOPRIM) 300 MG tablet TAKE 1 TABLET BY MOUTH DAILY Patient taking differently: Take 300 mg by mouth at bedtime. 01/28/23   [name], MD  amiodarone (PACERONE) 200 MG tablet 1 tablet daily. Patient taking differently: Take 200 mg by mouth in the morning. 03/16/23   Ronney Asters, NP  amoxicillin-clavulanate (AUGMENTIN) 875-125 MG tablet Take 1 tablet by mouth 2 (two) times daily. 03/18/23   Rhyne, Ames Coupe, PA-C  aspirin EC 81 MG tablet Take 1 tablet (81 mg total) by mouth daily. Swallow whole. 02/16/23   Hughie Closs, MD  doxazosin (CARDURA) 2 MG tablet TAKE 1 TABLET BY MOUTH DAILY Patient taking differently: Take 2 mg by  mouth at bedtime. 08/04/22   [name], MD  doxycycline (VIBRA-TABS) 100 MG tablet Take 1 tablet (100 mg total) by mouth 2 (two) times daily. 03/25/23   [name], MD  ELIQUIS 5 MG TABS tablet TAKE 1 TABLET BY MOUTH 2 TIMES A DAY 03/23/23   [name], MD  empagliflozin (JARDIANCE) 10 MG TABS tablet Take 1 tablet (10 mg total) by mouth daily. Patient taking differently: Take 10 mg by mouth in the morning. 08/16/22   Arty Baumgartner, NP  furosemide (LASIX) 40 MG tablet Take 1 tablet (40 mg total) by mouth 2 (two) times daily. 11/23/22   [name], MD  glipiZIDE (GLIPIZIDE XL) 5 MG 24 hr tablet Take 1 tablet (5 mg total) by mouth daily with breakfast. 12/21/22   [name], MD  HYDROcodone-acetaminophen (NORCO/VICODIN) 5-325 MG tablet Take 1 tablet by mouth every 6 (six) hours as needed for moderate pain (pain score 4-6). 03/25/23   [name], MD  insulin aspart (FIASP FLEXTOUCH) 100 UNIT/ML FlexTouch Pen Before each meal 3 times a day, 140-199 - 2 units, 200-250 - 4 units, 251-299 - 6 units,  300-349 - 8 units,  350 or above 10 units. 04/07/23   [name], MD  metoprolol tartrate (LOPRESSOR) 50 MG tablet TAKE 1 AND 1/2 TABLETS BY MOUTH TWO TIMES A DAY PT NEEDS CPE APPOINTMENT FOR FUTURE REFILLS Patient taking differently: Take 75 mg by mouth 2 (two) times daily. 03/04/23   [name], MD  mirtazapine (REMERON) 30 MG tablet TAKE 1 TABLET BY MOUTH EVERY NIGHT AT BEDTIME FOR APPETITE Patient taking differently: Take 30 mg by mouth at bedtime. TAKE 1 TABLET BY MOUTH EVERY NIGHT AT BEDTIME FOR APPETITE 03/07/23   [name], MD  pantoprazole (PROTONIX) 40 MG tablet TAKE 1 TABLET BY MOUTH DAILY Patient taking differently: Take 40 mg by mouth in the morning. 03/04/23   [name], MD  Phenylephrine-APAP-guaiFENesin (TYLENOL SINUS SEVERE) 5-325-200 MG TABS Take 2 tablets by mouth daily as needed (congestion).    [provider]   predniSONE (DELTASONE) 20 MG tablet 3 tabs poqday 1-2, 2 tabs poqday 3-4, 1 tab poqday 5-6 Patient not taking: Reported on 04/07/2023 03/25/23   [name], MD  predniSONE (DELTASONE) 5 MG tablet Take 1 tablet (5 mg total) by mouth daily with breakfast. Patient not taking: Reported on 04/07/2023 03/08/23   Fuller Plan, MD  rosuvastatin (CRESTOR) 40 MG tablet Take 1 tablet (40 mg total) by mouth daily. Patient taking differently: Take 40 mg by mouth in the morning. 02/15/23 02/15/24  Hughie Closs, MD  spironolactone (ALDACTONE) 25 MG tablet Take 1 tablet (25 mg total) by mouth 2 (two) times daily. 11/23/22 04/07/23  [name], MD  Tocilizumab (ACTEMRA IV) Inject 6 mg/kg into the vein every 28 (twenty-eight) days.    [provider]    Physical Exam: Vitals:   04/12/23 1330 04/12/23 1345 04/12/23 1349 04/12/23 1400  BP: (!) 107/56 (!) 92/55 (!) 92/55 (!) 95/52  Pulse: (!) 120 (!) 120 (!) 111 (!) 108  Resp: (!) 21 (!) 24 (!) 22 20  Temp:   (!) 97.5 F (36.4 C)   TempSrc:      SpO2: 100% 100% 100% 100%  Weight:      Height:        Physical Exam Constitutional:      General: He is not in acute distress.    Appearance: Normal appearance.  HENT:     Head: Normocephalic and atraumatic.     Mouth/Throat:     Mouth: Mucous membranes are moist.     Pharynx: Oropharynx is clear.  Eyes:     Extraocular Movements: Extraocular movements intact.     Pupils: Pupils are equal, round, and reactive to light.  Cardiovascular:     Rate and Rhythm: Normal rate and regular rhythm.     Pulses: Normal pulses.     Heart sounds: Normal heart sounds.  Pulmonary:     Effort: Pulmonary effort is normal. No respiratory distress.     Breath sounds: Normal breath sounds.  Abdominal:     General: Bowel sounds are normal. There is no distension.     Palpations: Abdomen is soft.     Tenderness: There is no abdominal tenderness.  Musculoskeletal:        General: No swelling or  deformity.     Comments: Left heal erythema, edema, warmth.  Skin:  General: Skin is warm and dry.  Neurological:     General: No focal deficit present.     Mental Status: Mental status is at baseline.    Labs on Admission: I have personally reviewed following labs and imaging studies  CBC: Recent Labs  Lab 04/12/23 1040 04/12/23 1056  WBC 17.2*  --   NEUTROABS 16.2*  --   HGB 9.9* 10.9*  HCT 32.2* 32.0*  MCV 104.2*  --   PLT 228  --     Basic Metabolic Panel: Recent Labs  Lab 04/12/23 1038 04/12/23 1056  NA 134* 132*  K 3.6 3.5  CL 98 99  CO2 19*  --   GLUCOSE 199* 195*  BUN 34* 31*  CREATININE 2.65* 2.60*  CALCIUM 8.8*  --     GFR: Estimated Creatinine Clearance: 21.1 mL/min (A) (by C-G formula based on SCr of 2.6 mg/dL (H)).  Liver Function Tests: Recent Labs  Lab 04/12/23 1038  AST 67*  ALT 34  ALKPHOS 139*  BILITOT 0.9  PROT 6.1*  ALBUMIN 2.3*    Urine analysis:    Component Value Date/Time   COLORURINE YELLOW 02/09/2023 0915   APPEARANCEUR CLEAR 02/09/2023 0915   LABSPEC 1.018 02/09/2023 0915   PHURINE 5.0 02/09/2023 0915   GLUCOSEU NEGATIVE 02/09/2023 0915   HGBUR NEGATIVE 02/09/2023 0915   BILIRUBINUR NEGATIVE 02/09/2023 0915   KETONESUR NEGATIVE 02/09/2023 0915   PROTEINUR NEGATIVE 02/09/2023 0915   UROBILINOGEN 0.2 05/06/2013 2202   NITRITE NEGATIVE 02/09/2023 0915   LEUKOCYTESUR NEGATIVE 02/09/2023 0915    Radiological Exams on Admission: DG Foot Complete Left Result Date: 04/12/2023 CLINICAL DATA:  sepsis, foot infection. EXAM: LEFT FOOT - COMPLETE 3+ VIEW COMPARISON:  02/08/2023. FINDINGS: There is diffuse osteopenia of the visualized osseous structures. Since the prior study, patient underwent amputation of fifth toe, with resection at the level of middle third of the proximal phalanx. Resection margin is sharp. No focal bone erosions. No acute fracture or dislocation. No aggressive osseous lesion. Mild diffuse degenerative  changes of imaged joints. Calcaneal spur noted along the Achilles tendon and Plantar aponeurosis attachment sites. No focal soft tissue swelling. No radiopaque foreign bodies. IMPRESSION: No acute osseous abnormality of the left foot. Electronically Signed   By: Jules Schick M.D.   On: 04/12/2023 13:35   DG Chest Port 1 View Result Date: 04/12/2023 CLINICAL DATA:  weakness, foot redness, sepsis. EXAM: PORTABLE CHEST 1 VIEW COMPARISON:  11/24/2018. FINDINGS: Low lung volume. There are probable atelectatic changes at the lung bases. Bilateral lung fields are otherwise clear. No dense consolidation or lung collapse. No pulmonary edema. Bilateral costophrenic angles are clear. Mildly enlarged cardio-mediastinal silhouette. There are surgical staples along the heart border and sternotomy wires, status post CABG (coronary artery bypass graft). No acute osseous abnormalities. The soft tissues are within normal limits. IMPRESSION: No active disease. Electronically Signed   By: Jules Schick M.D.   On: 04/12/2023 13:34   EKG: Independently reviewed.  Atrial fibrillation at 127 bpm.  Nonspecific intraventricular conduction delay with QRS of 144.  Evidence of LVH with repolarization abnormality.  QTc prolonged at 557.  Similar to previous but rate faster.  Assessment/Plan 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   DM2 (diabetes mellitus, type 2) (HCC)   GERD (gastroesophageal reflux disease)   Mixed hyperlipidemia   Essential hypertension   Chronic systolic congestive heart failure, NYHA class 1 (HCC)   Hx of CABG   Anemia  Atrial fibrillation, chronic (HCC)   PAD (peripheral artery disease) (HCC)   Sepsis > Concern for sepsis secondary to cellulitis versus other. > Presented initially as altered and hypoglycemic which improved with D10 and route. > Chronic leukocytosis to 17.2 currently. Evidence of wound on left heel.  Lactic acid trend 4.6, 2.7.  Tachycardia, temperature  97.5, intermittent hypotension in the 80s to 110 systolic in the ED. > Urinalysis, blood cultures pending.  No acute normality on chest x-ray, no bony abnormality on left foot x-ray. > Received vancomycin, cefepime, Flagyl EDP.  Has received 2 L IV fluids in the ED.  Significant AKI as below. - Monitor in progressive unit - Continue with IV fluids overnight - Continue  vancomycin, cefepime, Flagyl with concern for diabetic foot infection - Follow-up urinalysis, blood cultures - Trend fever curve and WBC - MR L foot, to rule out osteomyelitis given severity of presentation out of proportion with simple cellulitis - Supportive care  AKI > Creatinine elevated to 2.65.  Has been elevated to this extent for about a week.  Previous baseline a month ago was 1.0. > Unclear etiology.  Has had some decreased p.o. intake and nausea recently.  This was present a week ago but patient has continued on his diuretics which are likely exacerbating the issue.  This could be contributing to his hypotension as well. - Continue with IV fluids, trying to be reasonably judicious with setting of possible sepsis/hypotension and history of CHF. - Trend renal function and electrolytes  Chronic systolic CHF > Last echo was in November 2024 with EF 25%, global hypokinesis, indeterminate diastolic function, mildly reduced RV function.  Moderate MR.  - Holding Lasix, spironolactone, metoprolol in the setting of hypotension and AKI - Trend renal function and electrolytes - I's and O's, daily weights  CAD > Status post CABG - Can continue ASA - Holding metoprolol, Lasix, spironolactone as above - Continue home rosuvastatin  PAD - Continue home ASA, rosuvastatin  Hyperlipidemia - Continue home rosuvastatin  Atrial fibrillation > Tachycardia/RVR as above. - Holding metoprolol as above - Continue home amiodarone - Continue home Eliquis  Diabetes - SSI  Anemia > Hemoglobin near baseline at 9.9. - Trend  CBC  Gout - Holding allopurinol in the setting of AKI  DVT prophylaxis: Eliquis Code Status:   Full Family Communication:  Updated at bedside Disposition Plan:   Patient is from:  Home  Anticipated DC to:  Home  Anticipated DC date:  1 to 7 days  Anticipated DC barriers: None  Consults called:  None  Admission status:  Observation, progressive  Severity of Illness: The appropriate patient status for this patient is OBSERVATION. Observation status is judged to be reasonable and necessary in order to provide the required intensity of service to ensure the patient's safety. The patient's presenting symptoms, physical exam findings, and initial radiographic and laboratory data in the context of their medical condition is felt to place them at decreased risk for further clinical deterioration. Furthermore, it is anticipated that the patient will be medically stable for discharge from the hospital within 2 midnights of admission.    Synetta Fail MD [facility]  How to contact the [facility] Attending or Consulting provider 7A - 7P or covering provider during after hours 7P -7A, for this patient?   Check the care team in [facility] and look for a) attending/consulting [facility] provider listed and b) the [facility] team listed Log into [email or web address] and use [place]'s universal password to access.  If you do not have the password, please contact the hospital operator. Locate the [facility] provider you are looking for under [facility] and page to a number that you can be directly reached. If you still have difficulty reaching the provider, please page the [facility] (Director on Call) for the Hospitalists listed on amion for assistance.  04/12/2023, 2:53 PM

## 2023-04-12 NOTE — Plan of Care (Addendum)
 New admit for hypoglycemia. AxOx4, RA, cardiac monitoring. Achs. Fall precaution, bed at lowest level. WOCN consulted for left leg wound.   Problem: Metabolic: Goal: Ability to maintain appropriate glucose levels will improve Outcome: Not Met (add Reason)   Problem: Safety: Goal: Ability to remain free from injury will improve Outcome: Not Met (add Reason)   Problem: Education: Goal: Ability to describe self-care measures that may prevent or decrease complications (Diabetes Survival Skills Education) will improve 04/12/2023 1822 by Ileana Roup, RN Outcome: Not Applicable 04/12/2023 1819 by Ileana Roup, RN Outcome: Not Met (add Reason) Goal: Individualized Educational Video(s) 04/12/2023 1822 by Ileana Roup, RN Outcome: Not Applicable 04/12/2023 1819 by Ileana Roup, RN Outcome: Not Met (add Reason)

## 2023-04-12 NOTE — ED Provider Notes (Signed)
 [name] Provider Note   CSN: [id number] Arrival date & time: 04/12/23  0940     History  Chief Complaint  Patient presents with   Hypoglycemia    Donald Rios is a 83 y.o. male.  HPI 83 year old male presents by EMS for hypoglycemia.  History is taken from the patient as well as the paramedic.  Patient woke up feeling weak.  Paramedic reports that they were called out for altered mental status and the patient was confused and his CBG was around 45.  They gave him D10 and his glucose has come up and his mental status has improved.  Patient tells me he has been having nausea and poor p.o. intake for a few days.  He denies any pain.  He feels all over weak.  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 Hetty Blend.  He states he has been doing well.  He thinks he is currently on antibiotics but is not sure.  Home Medications Prior to Admission medications   Medication Sig Start Date End Date Taking? Authorizing Provider  allopurinol (ZYLOPRIM) 300 MG tablet TAKE 1 TABLET BY MOUTH DAILY Patient taking differently: Take 300 mg by mouth at bedtime. 01/28/23   [name], MD  amiodarone (PACERONE) 200 MG tablet 1 tablet daily. Patient taking differently: Take 200 mg by mouth in the morning. 03/16/23   Ronney Asters, NP  amoxicillin-clavulanate (AUGMENTIN) 875-125 MG tablet Take 1 tablet by mouth 2 (two) times daily. 03/18/23   Rhyne, Ames Coupe, PA-C  aspirin EC 81 MG tablet Take 1 tablet (81 mg total) by mouth daily. Swallow whole. 02/16/23   Hughie Closs, MD  doxazosin (CARDURA) 2 MG tablet TAKE 1 TABLET BY MOUTH DAILY Patient taking differently: Take 2 mg by mouth at bedtime. 08/04/22   [name], MD  doxycycline (VIBRA-TABS) 100 MG tablet Take 1 tablet (100 mg total) by mouth 2 (two) times daily. 03/25/23   [name], MD  ELIQUIS 5 MG TABS tablet TAKE 1 TABLET BY MOUTH 2 TIMES A DAY 03/23/23   [name], MD   empagliflozin (JARDIANCE) 10 MG TABS tablet Take 1 tablet (10 mg total) by mouth daily. Patient taking differently: Take 10 mg by mouth in the morning. 08/16/22   Arty Baumgartner, NP  furosemide (LASIX) 40 MG tablet Take 1 tablet (40 mg total) by mouth 2 (two) times daily. 11/23/22   [name], MD  glipiZIDE (GLIPIZIDE XL) 5 MG 24 hr tablet Take 1 tablet (5 mg total) by mouth daily with breakfast. 12/21/22   [name], MD  HYDROcodone-acetaminophen (NORCO/VICODIN) 5-325 MG tablet Take 1 tablet by mouth every 6 (six) hours as needed for moderate pain (pain score 4-6). 03/25/23   [name], MD  insulin aspart (FIASP FLEXTOUCH) 100 UNIT/ML FlexTouch Pen Before each meal 3 times a day, 140-199 - 2 units, 200-250 - 4 units, 251-299 - 6 units,  300-349 - 8 units,  350 or above 10 units. 04/07/23   [name], MD  metoprolol tartrate (LOPRESSOR) 50 MG tablet TAKE 1 AND 1/2 TABLETS BY MOUTH TWO TIMES A DAY **PT NEEDS CPE APPOINTMENT FOR FUTURE REFILLS** Patient taking differently: Take 75 mg by mouth 2 (two) times daily. 03/04/23   [name], MD  mirtazapine (REMERON) 30 MG tablet TAKE 1 TABLET BY MOUTH EVERY NIGHT AT BEDTIME FOR APPETITE Patient taking differently: Take 30 mg  by mouth at bedtime. TAKE 1 TABLET BY MOUTH EVERY NIGHT AT BEDTIME FOR APPETITE 03/07/23   [name], MD  pantoprazole (PROTONIX) 40 MG tablet TAKE 1 TABLET BY MOUTH DAILY Patient taking differently: Take 40 mg by mouth in the morning. 03/04/23   [name], MD  Phenylephrine-APAP-guaiFENesin (TYLENOL SINUS SEVERE) 5-325-200 MG TABS Take 2 tablets by mouth daily as needed (congestion).    [provider]  predniSONE (DELTASONE) 20 MG tablet 3 tabs poqday 1-2, 2 tabs poqday 3-4, 1 tab poqday 5-6 Patient not taking: Reported on 04/07/2023 03/25/23   [name], MD  predniSONE (DELTASONE) 5 MG tablet Take 1 tablet (5 mg total) by mouth daily with breakfast. Patient not  taking: Reported on 04/07/2023 03/08/23   Fuller Plan, MD  rosuvastatin (CRESTOR) 40 MG tablet Take 1 tablet (40 mg total) by mouth daily. Patient taking differently: Take 40 mg by mouth in the morning. 02/15/23 02/15/24  Hughie Closs, MD  spironolactone (ALDACTONE) 25 MG tablet Take 1 tablet (25 mg total) by mouth 2 (two) times daily. 11/23/22 04/07/23  [name], MD  Tocilizumab (ACTEMRA IV) Inject 6 mg/kg into the vein every 28 (twenty-eight) days.    [provider]      Allergies    Patient has no known allergies.    Review of Systems   Review of Systems  Respiratory:  Negative for cough and shortness of breath.   Cardiovascular:  Negative for chest pain.  Gastrointestinal:  Positive for nausea. Negative for vomiting.  Skin:  Positive for color change and wound.    Physical Exam Updated Vital Signs BP (!) 95/52   Pulse (!) 108   Temp (!) 97.5 F (36.4 C)   Resp 20   Ht 5\' 8"  (1.727 m)   Wt 67 kg   SpO2 100%   BMI 22.46 kg/m  Physical Exam Vitals and nursing note reviewed.  Constitutional:      Appearance: He is well-developed.  HENT:     Head: Normocephalic and atraumatic.     Mouth/Throat:     Mouth: Mucous membranes are dry.  Eyes:     Extraocular Movements: Extraocular movements intact.     Pupils: Pupils are equal, round, and reactive to light.  Cardiovascular:     Rate and Rhythm: Regular rhythm. Tachycardia present.     Pulses:          Dorsalis pedis pulses are 2+ on the left side.     Heart sounds: Normal heart sounds.  Pulmonary:     Effort: Pulmonary effort is normal.     Breath sounds: Normal breath sounds.  Abdominal:     General: There is no distension.     Palpations: Abdomen is soft.     Tenderness: There is no abdominal tenderness.  Musculoskeletal:     Comments: See picture.  Patient does not have much sensation in the foot so there is no tenderness.  There is some swelling compared to the right foot  Skin:    General:  Skin is warm and dry.  Neurological:     Mental Status: He is alert.     Comments: 5/5 strength in all 4 extremities.  Alert and oriented to person, place, time.        ED Results / Procedures / Treatments   Labs (all labs ordered are listed, but only abnormal results are displayed) Labs Reviewed  RESP PANEL BY RT-PCR (RSV, FLU A&B, COVID)  RVPGX2 - Abnormal; Notable for the following components:      Result Value   SARS Coronavirus 2 by RT PCR POSITIVE (*)    All other components within normal limits  CBC WITH DIFFERENTIAL/PLATELET - Abnormal; Notable for the following components:   WBC 17.2 (*)    RBC 3.09 (*)    Hemoglobin 9.9 (*)    HCT 32.2 (*)    MCV 104.2 (*)    RDW 18.3 (*)    Neutro Abs 16.2 (*)    Lymphs Abs 0.4 (*)    Abs Immature Granulocytes 0.15 (*)    All other components within normal limits  PROTIME-INR - Abnormal; Notable for the following components:   Prothrombin Time 19.8 (*)    INR 1.7 (*)    All other components within normal limits  APTT - Abnormal; Notable for the following components:   aPTT 39 (*)    All other components within normal limits  COMPREHENSIVE METABOLIC PANEL WITH GFR - Abnormal; Notable for the following components:   Sodium 134 (*)    CO2 19 (*)    Glucose, Bld 199 (*)    BUN 34 (*)    Creatinine, Ser 2.65 (*)    Calcium 8.8 (*)    Total Protein 6.1 (*)    Albumin 2.3 (*)    AST 67 (*)    Alkaline Phosphatase 139 (*)    GFR, Estimated 23 (*)    Anion gap 17 (*)    All other components within normal limits  CBG MONITORING, ED - Abnormal; Notable for the following components:   Glucose-Capillary 135 (*)    All other components within normal limits  I-STAT CG4 LACTIC ACID, ED - Abnormal; Notable for the following components:   Lactic Acid, Venous 4.6 (*)    All other components within normal limits  I-STAT CHEM 8, ED - Abnormal; Notable for the following components:   Sodium 132 (*)    BUN 31 (*)    Creatinine, Ser 2.60  (*)    Glucose, Bld 195 (*)    Calcium, Ion 1.08 (*)    TCO2 21 (*)    Hemoglobin 10.9 (*)    HCT 32.0 (*)    All other components within normal limits  CBG MONITORING, ED - Abnormal; Notable for the following components:   Glucose-Capillary 144 (*)    All other components within normal limits  I-STAT CG4 LACTIC ACID, ED - Abnormal; Notable for the following components:   Lactic Acid, Venous 2.7 (*)    All other components within normal limits  CBG MONITORING, ED - Abnormal; Notable for the following components:   Glucose-Capillary 128 (*)    All other components within normal limits  CULTURE, BLOOD (ROUTINE X 2)  CULTURE, BLOOD (ROUTINE X 2)  URINALYSIS, W/ REFLEX TO CULTURE (INFECTION SUSPECTED)  CBG MONITORING, ED  CBG MONITORING, ED    EKG None  Radiology DG Foot Complete Left Result Date: 04/12/2023 CLINICAL DATA:  sepsis, foot infection. EXAM: LEFT FOOT - COMPLETE 3+ VIEW COMPARISON:  02/08/2023. FINDINGS: There is diffuse osteopenia of the visualized osseous structures. Since the prior study, patient underwent amputation of fifth toe, with resection at the level of middle third of the proximal phalanx. Resection margin is sharp. No focal bone erosions. No acute fracture or dislocation. No aggressive osseous lesion. Mild diffuse degenerative changes of imaged joints. Calcaneal spur noted along the Achilles tendon and Plantar aponeurosis attachment sites. No focal soft tissue swelling. No  radiopaque foreign bodies. IMPRESSION: No acute osseous abnormality of the left foot. Electronically Signed   By: Jules Schick M.D.   On: 04/12/2023 13:35   DG Chest Port 1 View Result Date: 04/12/2023 CLINICAL DATA:  weakness, foot redness, sepsis. EXAM: PORTABLE CHEST 1 VIEW COMPARISON:  11/24/2018. FINDINGS: Low lung volume. There are probable atelectatic changes at the lung bases. Bilateral lung fields are otherwise clear. No dense consolidation or lung collapse. No pulmonary edema. Bilateral  costophrenic angles are clear. Mildly enlarged cardio-mediastinal silhouette. There are surgical staples along the heart border and sternotomy wires, status post CABG (coronary artery bypass graft). No acute osseous abnormalities. The soft tissues are within normal limits. IMPRESSION: No active disease. Electronically Signed   By: Jules Schick M.D.   On: 04/12/2023 13:34    Procedures .Critical Care  Performed by: Pricilla Loveless, MD Authorized by: Pricilla Loveless, MD   Critical care provider statement:    Critical care time (minutes):  35   Critical care time was exclusive of:  Separately billable procedures and treating other patients   Critical care was necessary to treat or prevent imminent or life-threatening deterioration of the following conditions:  Sepsis   Critical care was time spent personally by me on the following activities:  Development of treatment plan with patient or surrogate, discussions with consultants, evaluation of patient's response to treatment, examination of patient, ordering and review of laboratory studies, ordering and review of radiographic studies, ordering and performing treatments and interventions, pulse oximetry, re-evaluation of patient's condition and review of old charts     Medications Ordered in ED Medications  sodium chloride 0.9 % bolus 500 mL (has no administration in time range)  sodium chloride 0.9 % bolus 500 mL (0 mLs Intravenous Stopped 04/12/23 1134)  ceFEPIme (MAXIPIME) 2 g in sodium chloride 0.9 % 100 mL IVPB (0 g Intravenous Stopped 04/12/23 1111)  metroNIDAZOLE (FLAGYL) IVPB 500 mg (0 mg Intravenous Stopped 04/12/23 1221)  vancomycin (VANCOCIN) IVPB 1000 mg/200 mL premix (0 mg Intravenous Stopped 04/12/23 1349)  sodium chloride 0.9 % bolus 1,000 mL (1,000 mLs Intravenous New Bag/Given 04/12/23 1134)    ED Course/ Medical Decision Making/ A&P                                 Medical Decision Making Amount and/or Complexity of Data  Reviewed Independent Historian: spouse Labs: ordered.    Details: Lactic 4.6, down trended to 2.7.  Acute on chronic kidney injury with anion gap acidosis.  Leukocytosis of 17. Radiology: ordered and independent interpretation performed.    Details: No CHF or obvious osteomyelitis ECG/medicine tests: ordered and independent interpretation performed.    Details: Tachycardia  Risk Prescription drug management. Decision regarding hospitalization.   Patient arrives awake and alert and not altered.  Was reportedly altered earlier, likely from hypoglycemia which has resolved.  However the patient is tachycardic and I am concerned about underlying sepsis.  He does not have a fever but otherwise meets criteria with leukocytosis, elevated lactate, and likely foot infection. Discussed with Dr. Alinda Money for admission.        Final Clinical Impression(s) / ED Diagnoses Final diagnoses:  Severe sepsis [facility])    Rx / DC Orders ED Discharge Orders     None         Pricilla Loveless, MD 04/12/23 1448

## 2023-04-12 NOTE — Progress Notes (Addendum)
 Pharmacy Antibiotic Note  Donald Rios is a 83 y.o. male admitted on 04/12/2023 with possible cellulitis and sepsis.  Pharmacy has been consulted for vancomycin and cefepime dosing. Metronidazole dosed by MD.   Plan: Cefepime 2 gm every 24 hours Vancomycin 1000 mg IV every 48 hours (eAUC 464, Scr 2.6, IBW) Metronidazole dosing per MD Monitor renal function and vancomycin levels as appropriate F/u LOT and de-escalate antibiotics as appropriate  Height: 5\' 8"  (172.7 cm) Weight: 67 kg (147 lb 11.3 oz) IBW/kg (Calculated) : 68.4  Temp (24hrs), Avg:97.7 F (36.5 C), Min:97.5 F (36.4 C), Max:98.3 F (36.8 C)  Recent Labs  Lab 04/12/23 1038 04/12/23 1040 04/12/23 1056 04/12/23 1057 04/12/23 1242 04/12/23 1718  WBC  --  17.2*  --   --   --   --   CREATININE 2.65*  --  2.60*  --   --   --   LATICACIDVEN  --   --   --  4.6* 2.7* 1.7    Estimated Creatinine Clearance: 21.1 mL/min (A) (by C-G formula based on SCr of 2.6 mg/dL (H)).    No Known Allergies  Antimicrobials this admission: Metronidazole 4/1 >> Vancomcyin 4/1 >> Cefepime 4/1 >>   Microbiology results: 4/1 COVID (+) 4/1 Blood Cx:  Thank you for allowing pharmacy to be a part of this patient's care.  Enos Fling, PharmD PGY-1 Acute Care Pharmacy Resident 04/12/2023 7:05 PM

## 2023-04-12 NOTE — Sepsis Progress Note (Signed)
 eLink is following this Code Sepsis.

## 2023-04-12 NOTE — ED Triage Notes (Signed)
 Pt to ED via [facility] from home. EMS was called out for AMS, when they arrived pt had left sided facial droop and drooling, fsbs=45, pt received D10, droop and drooling have improved. Pt informed EMS that he had not been eating. 20g RFA in place on arrival. Pt alert, states he feels weak and that he has had nausea and not eating for past few days.

## 2023-04-12 NOTE — ED Notes (Signed)
Bs: 109

## 2023-04-12 NOTE — Progress Notes (Signed)
 Hypoglycemic Event  CBG: 53  Treatment: D50 50 mL (25 gm)  Symptoms: None  Follow-up CBG: Time:1855 CBG Result:167  Possible Reasons for Event: Unknown  Comments/MD notified:Melvin Lyn Hollingshead, MD    Ileana Roup

## 2023-04-12 NOTE — Sepsis Progress Note (Signed)
 Notified provider of need to order fluid bolus.  ?

## 2023-04-12 NOTE — Progress Notes (Signed)
   04/12/23 2041  Provider Notification  Provider Name/Title Dr [name]  Date Provider Notified 04/12/23  Time Provider Notified 2041  Method of Notification Page  Notification Reason Other (Comment) (Pt positive for COVID)  Provider response See new orders  Date of Provider Response 04/12/23  Time of Provider Response 2046

## 2023-04-13 ENCOUNTER — Inpatient Hospital Stay (HOSPITAL_COMMUNITY)

## 2023-04-13 ENCOUNTER — Ambulatory Visit (HOSPITAL_BASED_OUTPATIENT_CLINIC_OR_DEPARTMENT_OTHER): Admitting: General Surgery

## 2023-04-13 ENCOUNTER — Encounter (HOSPITAL_COMMUNITY): Payer: Self-pay | Admitting: Internal Medicine

## 2023-04-13 DIAGNOSIS — N189 Chronic kidney disease, unspecified: Secondary | ICD-10-CM | POA: Diagnosis present

## 2023-04-13 DIAGNOSIS — I25810 Atherosclerosis of coronary artery bypass graft(s) without angina pectoris: Secondary | ICD-10-CM | POA: Diagnosis present

## 2023-04-13 DIAGNOSIS — S22088D Other fracture of T11-T12 vertebra, subsequent encounter for fracture with routine healing: Secondary | ICD-10-CM | POA: Diagnosis not present

## 2023-04-13 DIAGNOSIS — L03116 Cellulitis of left lower limb: Secondary | ICD-10-CM | POA: Diagnosis present

## 2023-04-13 DIAGNOSIS — R918 Other nonspecific abnormal finding of lung field: Secondary | ICD-10-CM | POA: Diagnosis not present

## 2023-04-13 DIAGNOSIS — I959 Hypotension, unspecified: Secondary | ICD-10-CM | POA: Diagnosis not present

## 2023-04-13 DIAGNOSIS — L97521 Non-pressure chronic ulcer of other part of left foot limited to breakdown of skin: Secondary | ICD-10-CM | POA: Diagnosis not present

## 2023-04-13 DIAGNOSIS — I4891 Unspecified atrial fibrillation: Secondary | ICD-10-CM | POA: Diagnosis not present

## 2023-04-13 DIAGNOSIS — I5020 Unspecified systolic (congestive) heart failure: Secondary | ICD-10-CM | POA: Diagnosis not present

## 2023-04-13 DIAGNOSIS — K5900 Constipation, unspecified: Secondary | ICD-10-CM | POA: Diagnosis not present

## 2023-04-13 DIAGNOSIS — J90 Pleural effusion, not elsewhere classified: Secondary | ICD-10-CM | POA: Diagnosis not present

## 2023-04-13 DIAGNOSIS — Z794 Long term (current) use of insulin: Secondary | ICD-10-CM | POA: Diagnosis not present

## 2023-04-13 DIAGNOSIS — L89629 Pressure ulcer of left heel, unspecified stage: Secondary | ICD-10-CM | POA: Diagnosis present

## 2023-04-13 DIAGNOSIS — M6281 Muscle weakness (generalized): Secondary | ICD-10-CM | POA: Diagnosis not present

## 2023-04-13 DIAGNOSIS — E1151 Type 2 diabetes mellitus with diabetic peripheral angiopathy without gangrene: Secondary | ICD-10-CM | POA: Diagnosis present

## 2023-04-13 DIAGNOSIS — D849 Immunodeficiency, unspecified: Secondary | ICD-10-CM | POA: Diagnosis present

## 2023-04-13 DIAGNOSIS — Z452 Encounter for adjustment and management of vascular access device: Secondary | ICD-10-CM | POA: Diagnosis not present

## 2023-04-13 DIAGNOSIS — I13 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, or unspecified chronic kidney disease: Secondary | ICD-10-CM | POA: Diagnosis present

## 2023-04-13 DIAGNOSIS — E119 Type 2 diabetes mellitus without complications: Secondary | ICD-10-CM

## 2023-04-13 DIAGNOSIS — Z7401 Bed confinement status: Secondary | ICD-10-CM | POA: Diagnosis not present

## 2023-04-13 DIAGNOSIS — A419 Sepsis, unspecified organism: Secondary | ICD-10-CM | POA: Diagnosis present

## 2023-04-13 DIAGNOSIS — E0821 Diabetes mellitus due to underlying condition with diabetic nephropathy: Secondary | ICD-10-CM | POA: Diagnosis not present

## 2023-04-13 DIAGNOSIS — R278 Other lack of coordination: Secondary | ICD-10-CM | POA: Diagnosis not present

## 2023-04-13 DIAGNOSIS — E872 Acidosis, unspecified: Secondary | ICD-10-CM | POA: Diagnosis present

## 2023-04-13 DIAGNOSIS — N179 Acute kidney failure, unspecified: Secondary | ICD-10-CM

## 2023-04-13 DIAGNOSIS — I739 Peripheral vascular disease, unspecified: Secondary | ICD-10-CM | POA: Diagnosis not present

## 2023-04-13 DIAGNOSIS — K8020 Calculus of gallbladder without cholecystitis without obstruction: Secondary | ICD-10-CM | POA: Diagnosis not present

## 2023-04-13 DIAGNOSIS — I4819 Other persistent atrial fibrillation: Secondary | ICD-10-CM | POA: Diagnosis present

## 2023-04-13 DIAGNOSIS — R0989 Other specified symptoms and signs involving the circulatory and respiratory systems: Secondary | ICD-10-CM | POA: Diagnosis not present

## 2023-04-13 DIAGNOSIS — M316 Other giant cell arteritis: Secondary | ICD-10-CM | POA: Diagnosis present

## 2023-04-13 DIAGNOSIS — L0390 Cellulitis, unspecified: Secondary | ICD-10-CM | POA: Diagnosis not present

## 2023-04-13 DIAGNOSIS — E1122 Type 2 diabetes mellitus with diabetic chronic kidney disease: Secondary | ICD-10-CM | POA: Diagnosis present

## 2023-04-13 DIAGNOSIS — I480 Paroxysmal atrial fibrillation: Secondary | ICD-10-CM | POA: Diagnosis not present

## 2023-04-13 DIAGNOSIS — I509 Heart failure, unspecified: Secondary | ICD-10-CM | POA: Diagnosis not present

## 2023-04-13 DIAGNOSIS — J4489 Other specified chronic obstructive pulmonary disease: Secondary | ICD-10-CM | POA: Diagnosis present

## 2023-04-13 DIAGNOSIS — U071 COVID-19: Secondary | ICD-10-CM

## 2023-04-13 DIAGNOSIS — I5022 Chronic systolic (congestive) heart failure: Secondary | ICD-10-CM | POA: Diagnosis present

## 2023-04-13 DIAGNOSIS — R6520 Severe sepsis without septic shock: Secondary | ICD-10-CM | POA: Diagnosis present

## 2023-04-13 DIAGNOSIS — Z89429 Acquired absence of other toe(s), unspecified side: Secondary | ICD-10-CM | POA: Diagnosis not present

## 2023-04-13 DIAGNOSIS — E43 Unspecified severe protein-calorie malnutrition: Secondary | ICD-10-CM | POA: Diagnosis present

## 2023-04-13 DIAGNOSIS — G9341 Metabolic encephalopathy: Secondary | ICD-10-CM | POA: Diagnosis present

## 2023-04-13 DIAGNOSIS — R578 Other shock: Secondary | ICD-10-CM | POA: Diagnosis not present

## 2023-04-13 DIAGNOSIS — D649 Anemia, unspecified: Secondary | ICD-10-CM | POA: Diagnosis not present

## 2023-04-13 DIAGNOSIS — E11649 Type 2 diabetes mellitus with hypoglycemia without coma: Secondary | ICD-10-CM | POA: Diagnosis present

## 2023-04-13 DIAGNOSIS — I482 Chronic atrial fibrillation, unspecified: Secondary | ICD-10-CM | POA: Diagnosis not present

## 2023-04-13 DIAGNOSIS — W19XXXD Unspecified fall, subsequent encounter: Secondary | ICD-10-CM | POA: Diagnosis present

## 2023-04-13 DIAGNOSIS — J9811 Atelectasis: Secondary | ICD-10-CM | POA: Diagnosis not present

## 2023-04-13 DIAGNOSIS — D631 Anemia in chronic kidney disease: Secondary | ICD-10-CM | POA: Diagnosis present

## 2023-04-13 DIAGNOSIS — R2689 Other abnormalities of gait and mobility: Secondary | ICD-10-CM | POA: Diagnosis not present

## 2023-04-13 DIAGNOSIS — K5901 Slow transit constipation: Secondary | ICD-10-CM | POA: Diagnosis not present

## 2023-04-13 DIAGNOSIS — T8744 Infection of amputation stump, left lower extremity: Secondary | ICD-10-CM | POA: Diagnosis present

## 2023-04-13 DIAGNOSIS — E871 Hypo-osmolality and hyponatremia: Secondary | ICD-10-CM | POA: Diagnosis present

## 2023-04-13 LAB — CBC
HCT: 24 % — ABNORMAL LOW (ref 39.0–52.0)
Hemoglobin: 7.5 g/dL — ABNORMAL LOW (ref 13.0–17.0)
MCH: 31.8 pg (ref 26.0–34.0)
MCHC: 31.3 g/dL (ref 30.0–36.0)
MCV: 101.7 fL — ABNORMAL HIGH (ref 80.0–100.0)
Platelets: 167 10*3/uL (ref 150–400)
RBC: 2.36 MIL/uL — ABNORMAL LOW (ref 4.22–5.81)
RDW: 18.2 % — ABNORMAL HIGH (ref 11.5–15.5)
WBC: 8.8 10*3/uL (ref 4.0–10.5)
nRBC: 0 % (ref 0.0–0.2)

## 2023-04-13 LAB — URINALYSIS, ROUTINE W REFLEX MICROSCOPIC
Bilirubin Urine: NEGATIVE
Glucose, UA: NEGATIVE mg/dL
Ketones, ur: NEGATIVE mg/dL
Leukocytes,Ua: NEGATIVE
Nitrite: NEGATIVE
Protein, ur: 30 mg/dL — AB
Specific Gravity, Urine: 1.015 (ref 1.005–1.030)
pH: 5 (ref 5.0–8.0)

## 2023-04-13 LAB — COMPREHENSIVE METABOLIC PANEL WITH GFR
ALT: 27 U/L (ref 0–44)
AST: 52 U/L — ABNORMAL HIGH (ref 15–41)
Albumin: 2.3 g/dL — ABNORMAL LOW (ref 3.5–5.0)
Alkaline Phosphatase: 99 U/L (ref 38–126)
Anion gap: 11 (ref 5–15)
BUN: 29 mg/dL — ABNORMAL HIGH (ref 8–23)
CO2: 19 mmol/L — ABNORMAL LOW (ref 22–32)
Calcium: 8 mg/dL — ABNORMAL LOW (ref 8.9–10.3)
Chloride: 104 mmol/L (ref 98–111)
Creatinine, Ser: 1.91 mg/dL — ABNORMAL HIGH (ref 0.61–1.24)
GFR, Estimated: 35 mL/min — ABNORMAL LOW (ref >60)
Glucose, Bld: 78 mg/dL (ref 70–99)
Potassium: 3 mmol/L — ABNORMAL LOW (ref 3.5–5.1)
Sodium: 134 mmol/L — ABNORMAL LOW (ref 135–145)
Total Bilirubin: 0.9 mg/dL (ref 0.0–1.2)
Total Protein: 5.1 g/dL — ABNORMAL LOW (ref 6.5–8.1)

## 2023-04-13 LAB — PROCALCITONIN: Procalcitonin: 0.53 ng/mL

## 2023-04-13 LAB — GLUCOSE, CAPILLARY
Glucose-Capillary: 67 mg/dL — ABNORMAL LOW (ref 70–99)
Glucose-Capillary: 92 mg/dL (ref 70–99)
Glucose-Capillary: 92 mg/dL (ref 70–99)
Glucose-Capillary: 93 mg/dL (ref 70–99)

## 2023-04-13 LAB — LACTIC ACID, PLASMA
Lactic Acid, Venous: 1.5 mmol/L (ref 0.5–1.9)
Lactic Acid, Venous: 1.2 mmol/L (ref 0.5–1.9)
Lactic Acid, Venous: 2 mmol/L (ref 0.5–1.9)

## 2023-04-13 LAB — BRAIN NATRIURETIC PEPTIDE: B Natriuretic Peptide: 582.2 pg/mL — ABNORMAL HIGH (ref 0.0–100.0)

## 2023-04-13 LAB — PROTIME-INR
INR: 1.6 — ABNORMAL HIGH (ref 0.8–1.2)
Prothrombin Time: 19 s — ABNORMAL HIGH (ref 11.4–15.2)

## 2023-04-13 LAB — SEDIMENTATION RATE: Sed Rate: 131 mm/h — ABNORMAL HIGH (ref 0–16)

## 2023-04-13 LAB — CORTISOL-AM, BLOOD: Cortisol - AM: 22.3 ug/dL (ref 6.7–22.6)

## 2023-04-13 LAB — MRSA NEXT GEN BY PCR, NASAL: MRSA by PCR Next Gen: NOT DETECTED

## 2023-04-13 LAB — C-REACTIVE PROTEIN: CRP: 18.8 mg/dL — ABNORMAL HIGH (ref <1.0)

## 2023-04-13 MED ORDER — CHLORHEXIDINE GLUCONATE CLOTH 2 % EX PADS
6.0000 | MEDICATED_PAD | Freq: Every day | CUTANEOUS | Status: DC
Start: 2023-04-13 — End: 2023-04-13

## 2023-04-13 MED ORDER — APIXABAN 5 MG PO TABS
5.0000 mg | ORAL_TABLET | Freq: Two times a day (BID) | ORAL | Status: DC
  Administered 2023-04-13 – 2023-04-14 (×4): 5 mg via ORAL
  Filled 2023-04-13 (×4): qty 1

## 2023-04-13 MED ORDER — MIDODRINE HCL 5 MG PO TABS
10.0000 mg | ORAL_TABLET | Freq: Three times a day (TID) | ORAL | Status: DC
  Administered 2023-04-13 – 2023-04-14 (×5): 10 mg via ORAL
  Filled 2023-04-13 (×4): qty 2

## 2023-04-13 MED ORDER — MIDODRINE HCL 5 MG PO TABS
5.0000 mg | ORAL_TABLET | Freq: Three times a day (TID) | ORAL | Status: DC
  Administered 2023-04-13 (×2): 5 mg via ORAL
  Filled 2023-04-13 (×3): qty 1

## 2023-04-13 MED ORDER — POTASSIUM CHLORIDE CRYS ER 20 MEQ PO TBCR
40.0000 meq | EXTENDED_RELEASE_TABLET | Freq: Once | ORAL | Status: AC
  Administered 2023-04-13: 40 meq via ORAL
  Filled 2023-04-13: qty 2

## 2023-04-13 MED ORDER — LACTATED RINGERS IV BOLUS
500.0000 mL | Freq: Once | INTRAVENOUS | Status: AC
  Administered 2023-04-13: 500 mL via INTRAVENOUS

## 2023-04-13 MED ORDER — ORAL CARE MOUTH RINSE: 15.0000 mL | OROMUCOSAL | Status: DC | PRN

## 2023-04-13 MED ORDER — POTASSIUM CHLORIDE CRYS ER 20 MEQ PO TBCR
20.0000 meq | EXTENDED_RELEASE_TABLET | Freq: Once | ORAL | Status: AC
  Administered 2023-04-13: 20 meq via ORAL
  Filled 2023-04-13: qty 1

## 2023-04-13 MED ORDER — CHLORHEXIDINE GLUCONATE CLOTH 2 % EX PADS
6.0000 | MEDICATED_PAD | Freq: Every day | CUTANEOUS | Status: DC
  Administered 2023-04-13 – 2023-04-19 (×7): 6 via TOPICAL

## 2023-04-13 NOTE — Plan of Care (Signed)
 Problem: Education: Goal: Knowledge of risk factors and measures for prevention of condition will improve Outcome: Progressing   Problem: Coping: Goal: Psychosocial and spiritual needs will be supported Outcome: Progressing   Problem: Respiratory: Goal: Will maintain a patent airway Outcome: Progressing Goal: Complications related to the disease process, condition or treatment will be avoided or minimized Outcome: Progressing   Problem: Fluid Volume: Goal: Hemodynamic stability will improve Outcome: Progressing   Problem: Clinical Measurements: Goal: Diagnostic test results will improve Outcome: Progressing Goal: Signs and symptoms of infection will decrease Outcome: Progressing   Problem: Respiratory: Goal: Ability to maintain adequate ventilation will improve Outcome: Progressing   Problem: Coping: Goal: Ability to adjust to condition or change in health will improve Outcome: Progressing   Problem: Fluid Volume: Goal: Ability to maintain a balanced intake and output will improve Outcome: Progressing   Problem: Health Behavior/Discharge Planning: Goal: Ability to identify and utilize available resources and services will improve Outcome: Progressing Goal: Ability to manage health-related needs will improve Outcome: Progressing   Problem: Metabolic: Goal: Ability to maintain appropriate glucose levels will improve Outcome: Progressing   Problem: Nutritional: Goal: Maintenance of adequate nutrition will improve Outcome: Progressing Goal: Progress toward achieving an optimal weight will improve Outcome: Progressing   Problem: Skin Integrity: Goal: Risk for impaired skin integrity will decrease Outcome: Progressing   Problem: Tissue Perfusion: Goal: Adequacy of tissue perfusion will improve Outcome: Progressing   Problem: Education: Goal: Ability to describe self-care measures that may prevent or decrease complications (Diabetes Survival Skills Education)  will improve Outcome: Progressing Goal: Individualized Educational Video(s) Outcome: Progressing   Problem: Coping: Goal: Ability to adjust to condition or change in health will improve Outcome: Progressing   Problem: Fluid Volume: Goal: Ability to maintain a balanced intake and output will improve Outcome: Progressing   Problem: Health Behavior/Discharge Planning: Goal: Ability to identify and utilize available resources and services will improve Outcome: Progressing Goal: Ability to manage health-related needs will improve Outcome: Progressing   Problem: Metabolic: Goal: Ability to maintain appropriate glucose levels will improve Outcome: Progressing   Problem: Nutritional: Goal: Maintenance of adequate nutrition will improve Outcome: Progressing Goal: Progress toward achieving an optimal weight will improve Outcome: Progressing   Problem: Skin Integrity: Goal: Risk for impaired skin integrity will decrease Outcome: Progressing   Problem: Tissue Perfusion: Goal: Adequacy of tissue perfusion will improve Outcome: Progressing   Problem: Education: Goal: Knowledge of General Education information will improve Description: Including pain rating scale, medication(s)/side effects and non-pharmacologic comfort measures Outcome: Progressing   Problem: Health Behavior/Discharge Planning: Goal: Ability to manage health-related needs will improve Outcome: Progressing   Problem: Clinical Measurements: Goal: Ability to maintain clinical measurements within normal limits will improve Outcome: Progressing Goal: Will remain free from infection Outcome: Progressing Goal: Diagnostic test results will improve Outcome: Progressing Goal: Respiratory complications will improve Outcome: Progressing Goal: Cardiovascular complication will be avoided Outcome: Progressing   Problem: Activity: Goal: Risk for activity intolerance will decrease Outcome: Progressing   Problem:  Nutrition: Goal: Adequate nutrition will be maintained Outcome: Progressing   Problem: Coping: Goal: Level of anxiety will decrease Outcome: Progressing   Problem: Elimination: Goal: Will not experience complications related to bowel motility Outcome: Progressing Goal: Will not experience complications related to urinary retention Outcome: Progressing   Problem: Pain Managment: Goal: General experience of comfort will improve and/or be controlled Outcome: Progressing   Problem: Safety: Goal: Ability to remain free from injury will improve Outcome: Progressing   Problem: Skin Integrity: Goal:  Risk for impaired skin integrity will decrease Outcome: Progressing

## 2023-04-13 NOTE — Progress Notes (Cosign Needed Addendum)
 NAME:  Donald Rios, MRN:  [id number], DOB:  1940/03/10, LOS: 0 ADMISSION DATE:  04/12/2023, CONSULTATION DATE:  04/13/23 REFERRING MD:  [name] , CHIEF COMPLAINT:  hypotension   History of Present Illness:  83 yo  PMH HTN HLD CAD Afib GERD PAD COPD HFrEF who was admitted to [facility] 4/1 after presenting w AMS and facial droop. CBG was <50, rcvd d10 and mentation improved. Recent L fifth toe amputation, has had a fall since then + ongoing L foot pain. Soft Bps in ED, given 2L IVF and started on broad abx.  4/1-4/2 night pt w ongoing low BP, SBP 90s and MAPs dipping <65. He rcvd addition 2L LR and is on mIVF.   PCCM is consulted in this setting   Pertinent  Medical History  HFrEF Cad COPD Afib GERD PAD    Significant Hospital Events: Including procedures, antibiotic start and stop dates in addition to other pertinent events   4/1 admit to [facility] broad abx c/f cellulitis L foot  4/2 PCCM consult for low NIBP readings, txf to ICU   Interim History / Subjective:  Feels at his baseline. Complaining of constipation for the last 3 days  Objective   Blood pressure (!) 104/44, pulse 85, temperature (!) 97.1 F (36.2 C), temperature source Oral, resp. rate (!) 24, height 5\' 8"  (1.727 m), weight 63.9 kg, SpO2 98%.        Intake/Output Summary (Last 24 hours) at 04/13/2023 1029 Last data filed at 04/13/2023 1000 Gross per 24 hour  Intake 3020.3 ml  Output 330 ml  Net 2690.3 ml   Filed Weights   04/12/23 0949 04/13/23 0332  Weight: 67 kg 63.9 kg    Examination: General: ill appearing in no distress HENT: NCAT  Lungs: clear bilaterally  Cardiovascular: irregular, tachycardic  Abdomen: soft, non-tender  Extremities:  +2 edema left lower extremity. Left 5th toe amp, wound left heel. 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 CRP 18, Procal nml. Lactic down to 1.5. Unsure this is truly sepsis. White count is down to 8.8. no fever. UA neg. SPB 85-105 Plan cont cefepime, metronidazole, and vanc Blood cx pending  Shock (sepsis and hypovolemic) lactic acid cleared.  End-organ fxn improved. MS good. Renal fxn better  Asymptomatic, did not improve with 2L IVF. May benefit from more fluid, will need to be cautious with EF baseline of 25%.  Plan Continue midodrine  hold antihypertensives  SBP goal > 90 (just need to keep hime euvolemic)  abx as above  Had been on steroids. Random cortisol 22.3 last dosing was about 2 weeks ago. Will hold off from stress dose steroids   HFrEF baseline EF 25% Does not appear to be fluid overloaded, not  Plan hold home antihypertensives  cont statin, ASA, statin  Chronic A-fib Plan Continue home amio, eliquis  AKI Appears to have started in feb of this year. Improving. Creatinine 1.91 Plan follow BMP    DM2 with chronic diabetic wounds Hypoglycemia  Presented with CBG <50 and AMS improved with dextrose administration. Has continued to be hypoglycemic since admission.  Plan SSI  Encourage PO intake  Temporal arteritis Had been on chronic prednisone and was tapering off, dose  was 2.5mg  at rheum office visit on 2/24. Also receiving monthly tocilizumab Plan Deciding on steroids as above   Recent Fall T12 fx Plan PT/OT, supportive care PRN analgesia   Best Practice (right click and "Reselect all SmartList Selections" daily)   Diet/type: Regular consistency (see orders) DVT prophylaxis DOAC Pressure ulcer(s): present on admission  L heel  GI prophylaxis: N/A Lines: N/A Foley:  N/A Code Status:  full code Last date of multidisciplinary goals of care discussion [--]  Critical care time NA   Case reviewed w/ Deniece Ree sNP. Looks like he can  prob move out of ICU. Will watch BP a little longer her in ICU and prob move out this afternoon   CRITICAL CARE Performed by: Shelby Mattocks

## 2023-04-13 NOTE — Progress Notes (Signed)
 Pt's BP still running soft despite Albumin and Saline Bolus, PCCM consulted by admit Doc, ordered to be transferred to ICU for advance management, report called and given to [facility] RN in 2H, Pt informed of transfer and was taken to 2H at 0112. [name], [name]

## 2023-04-13 NOTE — Progress Notes (Signed)
 eLink Physician-Brief Progress Note Patient Name: Donald Rios DOB: [birth year] MRN: [id number]   Date of Service  04/13/2023  HPI/Events of Note  LA at 2, up from earlier 1.5  Sepsis from cellulitis. Not on presors. On midodrine. MRI neg for osteomyelitis. s/p > 3.5 lit overall. MAP 75. UOP good.  HFrEF, COPD.    eICU Interventions  Follow LA at 2 AM.      Intervention Category Intermediate Interventions: Other:  Ranee Gosselin 04/13/2023, 10:58 PM

## 2023-04-13 NOTE — Consult Note (Signed)
 NAME:  Donald Rios, MRN:  [id number], DOB:  10/03/1940, LOS: 0 ADMISSION DATE:  04/12/2023, CONSULTATION DATE:  04/13/23 REFERRING MD:  Donald Rios , CHIEF COMPLAINT:  hypotension   History of Present Illness:  83 yo  PMH HTN HLD CAD Afib GERD PAD COPD HFrEF who was admitted to [facility] 4/1 after presenting w AMS and facial droop. CBG was <50, rcvd d10 and mentation improved. Recent L fifth toe amputation, has had a fall since then + ongoing L foot pain. Soft Bps in ED, given 2L IVF and started on broad abx.  4/1-4/2 night pt w ongoing low BP, SBP 90s and MAPs dipping <65. He rcvd addition 2L LR and is on mIVF.   PCCM is consulted in this setting   Pertinent  Medical History  HFrEF Cad COPD Afib GERD PAD    Significant Hospital Events: Including procedures, antibiotic start and stop dates in addition to other pertinent events   4/1 admit to [facility] broad abx c/f cellulitis L foot  4/2 PCCM consult for low NIBP readings, txf to ICU   Interim History / Subjective:  SBP 92   Objective   Blood pressure (!) 92/46, pulse (!) 112, temperature 99.3 F (37.4 C), temperature source Oral, resp. rate 18, height 5\' 8"  (1.727 m), weight 67 kg, SpO2 98%.        Intake/Output Summary (Last 24 hours) at 04/13/2023 0106 Last data filed at 04/12/2023 2222 Gross per 24 hour  Intake 943.15 ml  Output --  Net 943.15 ml   Filed Weights   04/12/23 0949  Weight: 67 kg    Examination: General: Chronically ill appearing elderly M NAD HENT: NCAT pink mm Lungs: CTAb on RA  Cardiovascular: rr cap refill < 3 sec  Abdomen: soft ndnt  Extremities: L 5th toe amputation. L 3 and 4 toe edema.  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 Interestingly looks quite good, mentation is good, cap refill brisk-- which would argue against hypoperfusion and true shock state -has had fair volume resusc which has not had much of an impact on his hemodynamics. Does have HFrEF hx, but no obvious features of decomp HF. Does endorse poor PO intake prior to admission, could be that he is a little dry still, looks fairly euvolemic however   -did just come off of prednisone was previously on 2.5/d and then was Rx 5mg  starting 2/25 & has since completed course and come off altogether  P -transferring to ICU for closer monitoring -LA pending -- if elevated can consider periph pressors -adding midodrine now -hold antihypertensives  -check BNP -abx as above  -check cortisol, consider stress dose steroids    HFrEF, with RV dysfunction  Afib on eliquis Hx HTN  PAD HLD P -send BNP -cont amio -resume home eliquis  -hold home antihypertensives  -cont statin, ASA   AKI P -follow BMP   Lactic acidosis, improved -presented 4.6 then 2.7 then 1.7; despite ongoing soft Bps which is curious P -repeat is pending 4/2  DM2 with chronic diabetic wounds P -SSI   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 (based on below labs was stepped up from 2.5mg  to 5mg , 2/25-4/1,  unclear if pt completed this) and recently resumed monthly actemra -CRP at last rheum (03/07/23) 25 and ESR 22   Recent Fall T12 fx -PT/OT, supportive care -PRN analgesia   Best Practice (right click and "Reselect all SmartList Selections" daily)   Diet/type: Regular consistency (see orders) DVT prophylaxis DOAC Pressure ulcer(s): present on admission  L heel  GI prophylaxis: N/A Lines: N/A Foley:  N/A Code Status:  full code Last date of multidisciplinary goals of care discussion [--]  Labs   CBC: Recent Labs  Lab 04/12/23 1040 04/12/23 1056  WBC 17.2*  --   NEUTROABS 16.2*  --   HGB 9.9* 10.9*  HCT 32.2* 32.0*  MCV 104.2*  --   PLT 228  --     Basic  Metabolic Panel: Recent Labs  Lab 04/12/23 1038 04/12/23 1056  NA 134* 132*  K 3.6 3.5  CL 98 99  CO2 19*  --   GLUCOSE 199* 195*  BUN 34* 31*  CREATININE 2.65* 2.60*  CALCIUM 8.8*  --    GFR: Estimated Creatinine Clearance: 21.1 mL/min (A) (by C-G formula based on SCr of 2.6 mg/dL (H)). Recent Labs  Lab 04/12/23 1040 04/12/23 1057 04/12/23 1242 04/12/23 1718  WBC 17.2*  --   --   --   LATICACIDVEN  --  4.6* 2.7* 1.7    Liver Function Tests: Recent Labs  Lab 04/12/23 1038  AST 67*  ALT 34  ALKPHOS 139*  BILITOT 0.9  PROT 6.1*  ALBUMIN 2.3*   No results for input(s): "LIPASE", "AMYLASE" in the last 168 hours. No results for input(s): "AMMONIA" in the last 168 hours.  ABG    Component Value Date/Time   TCO2 21 (L) 04/12/2023 1056     Coagulation Profile: Recent Labs  Lab 04/12/23 1040  INR 1.7*    Cardiac Enzymes: No results for input(s): "CKTOTAL", "CKMB", "CKMBINDEX", "TROPONINI" in the last 168 hours.  HbA1C: Hgb A1c MFr Bld  Date/Time Value Ref Range Status  02/08/2023 10:52 PM 6.2 (H) 4.8 - 5.6 % Final    Comment:    (NOTE) Pre diabetes:          5.7%-6.4%  Diabetes:              >6.4%  Glycemic control for   <7.0% adults with diabetes   08/14/2022 03:17 AM 5.8 (H) 4.8 - 5.6 % Final    Comment:    (NOTE) Pre diabetes:          5.7%-6.4%  Diabetes:              >6.4%  Glycemic control for   <7.0% adults with diabetes     CBG: Recent Labs  Lab 04/12/23 1820 04/12/23 1855 04/12/23 2028 04/12/23 2210 04/13/23 0004  GLUCAP 53* 167* 119* 71 92    Review of Systems:   Review of Systems  Constitutional:  Positive for malaise/fatigue.  Musculoskeletal:  Positive for falls and joint pain.  Skin:  Positive for rash.     Past Medical History:  He,  has a past medical history of Acute gastric ulcer with hemorrhage (05/06/2013), Arthritis, Asthma, Atrial enlargement, left, 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, Cataract,  COPD (chronic obstructive pulmonary disease) (HCC), Diabetes mellitus, Diabetic nephropathy (HCC), Diverticulosis, ED (erectile dysfunction), GERD (gastroesophageal reflux disease), Gout, Hearing loss, Hiatal hernia (05/2008), History of MI (myocardial infarction), History of nuclear stress test (08/07/2010), Hyperlipidemia, Hyperplastic colon polyp (05/2008), Hypertension, Ischemic cardiomyopathy, Myocardial infarction (HCC), and  Valvular regurgitation.   Surgical History:   Past Surgical History:  Procedure Laterality Date   ABDOMINAL AORTOGRAM W/LOWER EXTREMITY N/A 02/14/2023   Procedure: ABDOMINAL AORTOGRAM W/LOWER EXTREMITY;  Surgeon: Daria Pastures, MD;  Location: [facility] INVASIVE CV LAB;  Service: Cardiovascular;  Laterality: N/A;   AMPUTATION Left 03/22/2023   Procedure: FIFTH TOE AMPUTATION LEFT FOOT;  Surgeon: Daria Pastures, MD;  Location: [facility] OR;  Service: Vascular;  Laterality: Left;   CARDIOVERSION N/A 08/16/2022   Procedure: CARDIOVERSION;  Surgeon: Jake Bathe, MD;  Location: [facility] INVASIVE CV LAB;  Service: Cardiovascular;  Laterality: N/A;   CORONARY ARTERY BYPASS GRAFT  2000   LIMA to LAD, free IMA to OM2, sequential graft to PLA & PLD   ESOPHAGOGASTRODUODENOSCOPY N/A 05/07/2013   Procedure: ESOPHAGOGASTRODUODENOSCOPY (EGD);  Surgeon: Iva Boop, MD;  Location: [facility] ENDOSCOPY;  Service: Endoscopy;  Laterality: N/A;   PERIPHERAL VASCULAR INTERVENTION Left 02/14/2023   Procedure: PERIPHERAL VASCULAR INTERVENTION;  Surgeon: Daria Pastures, MD;  Location: [facility] INVASIVE CV LAB;  Service: Cardiovascular;  Laterality: Left;  SFA   TEE WITHOUT CARDIOVERSION N/A 08/16/2022   Procedure: TRANSESOPHAGEAL ECHOCARDIOGRAM;  Surgeon: Jake Bathe, MD;  Location: [facility] INVASIVE CV LAB;  Service: Cardiovascular;  Laterality: N/A;   TRANSTHORACIC ECHOCARDIOGRAM  09/07/2010   EF 45-50%; LV systolic function mildly reduced; LA mildly dilated; mild-mod MR & mild-mod TR; aortic root sclerosis/calcification;       Social History:   reports that he quit smoking about 37 years ago. His smoking use included cigarettes. He has never used smokeless tobacco. He reports that he does not currently use alcohol. He reports that he does not use drugs.   Family History:  His family history includes Diabetes in his brother; Esophageal cancer in his brother; Hypertension in his father; Stomach cancer in his maternal aunt; Stroke in his father, maternal grandfather, and maternal grandmother. There is no history of Colon cancer or Rectal cancer.   Allergies No Known Allergies   Home Medications  Prior to Admission medications   Medication Sig Start Date End Date Taking? Authorizing Provider  allopurinol (ZYLOPRIM) 300 MG tablet TAKE 1 TABLET BY MOUTH DAILY Patient taking differently: Take 300 mg by mouth at bedtime. 01/28/23  Yes [name], MD  amiodarone (PACERONE) 200 MG tablet 1 tablet daily. Patient taking differently: Take 200 mg by mouth in the morning. 03/16/23  Yes Ronney Asters, NP  aspirin EC 81 MG tablet Take 1 tablet (81 mg total) by mouth daily. Swallow whole. 02/16/23  Yes Pahwani, Daleen Bo, MD  doxazosin (CARDURA) 2 MG tablet TAKE 1 TABLET BY MOUTH DAILY Patient taking differently: Take 2 mg by mouth at bedtime. 08/04/22  Yes [name], MD  ELIQUIS 5 MG TABS tablet TAKE 1 TABLET BY MOUTH 2 TIMES A DAY 03/23/23  Yes [name], MD  empagliflozin (JARDIANCE) 10 MG TABS tablet Take 1 tablet (10 mg total) by mouth daily. Patient taking differently: Take 10 mg by mouth in the morning. 08/16/22  Yes Arty Baumgartner, NP  furosemide (LASIX) 40 MG tablet Take 1 tablet (40 mg total) by mouth 2 (two) times daily. 11/23/22  Yes [name], MD  glipiZIDE (GLIPIZIDE XL) 5 MG 24 hr tablet Take 1 tablet (5 mg total) by mouth daily with breakfast. 12/21/22  Yes [name], MD  HYDROcodone-acetaminophen (NORCO/VICODIN) 5-325 MG tablet Take 1 tablet by mouth every 6 (six) hours as needed  for moderate pain (pain  score 4-6). 03/25/23  Yes [name], MD  insulin aspart (FIASP FLEXTOUCH) 100 UNIT/ML FlexTouch Pen Before each meal 3 times a day, 140-199 - 2 units, 200-250 - 4 units, 251-299 - 6 units,  300-349 - 8 units,  350 or above 10 units. 04/07/23  Yes [name], MD  metoprolol tartrate (LOPRESSOR) 50 MG tablet TAKE 1 AND 1/2 TABLETS BY MOUTH TWO TIMES A DAY PT NEEDS CPE APPOINTMENT FOR FUTURE REFILLS Patient taking differently: Take 75 mg by mouth 2 (two) times daily. 03/04/23  Yes [name], MD  mirtazapine (REMERON) 30 MG tablet TAKE 1 TABLET BY MOUTH EVERY NIGHT AT BEDTIME FOR APPETITE Patient taking differently: Take 30 mg by mouth at bedtime. TAKE 1 TABLET BY MOUTH EVERY NIGHT AT BEDTIME FOR APPETITE 03/07/23  Yes [name], MD  pantoprazole (PROTONIX) 40 MG tablet TAKE 1 TABLET BY MOUTH DAILY Patient taking differently: Take 40 mg by mouth in the morning. 03/04/23  Yes Pickard, Priscille Heidelberg, MD  Phenylephrine-APAP-guaiFENesin (TYLENOL SINUS SEVERE) 5-325-200 MG TABS Take 2 tablets by mouth daily as needed (congestion).   Yes [provider]  rosuvastatin (CRESTOR) 40 MG tablet Take 1 tablet (40 mg total) by mouth daily. Patient taking differently: Take 40 mg by mouth in the morning. 02/15/23 02/15/24 Yes Hughie Closs, MD  spironolactone (ALDACTONE) 25 MG tablet Take 1 tablet (25 mg total) by mouth 2 (two) times daily. 11/23/22 04/12/23 Yes PickardPriscille Heidelberg, MD  Tocilizumab (ACTEMRA IV) Inject 6 mg/kg into the vein every 28 (twenty-eight) days.   Yes [provider]     Critical care time: 41 min    CRITICAL CARE Performed by: Lanier Clam   Critical care time was exclusive of separately billable procedures and treating other patients. Critical care was necessary to treat or prevent imminent or life-threatening deterioration.  Critical care was time spent personally by me on the following activities: development of treatment plan  with patient and/or surrogate as well as nursing, discussions with consultants, evaluation of patient's response to treatment, examination of patient, obtaining history from patient or surrogate, ordering and performing treatments and interventions, ordering and review of laboratory studies, ordering and review of radiographic studies, pulse oximetry and re-evaluation of patient's condition.  Tessie Fass MSN, AGACNP-BC [facility] Amion for pager  04/13/2023, 1:06 AM

## 2023-04-13 NOTE — Consult Note (Signed)
 WOC Nurse Consult Note: patient with history of PVD, amputation L 5th digit 3/11 by vascular  Reason for Consult: wounds L foot  Wound type: 1. Full thickness wounds noted to L dorsal (top), 5th digit, L medial foot (bunion area)  2.  Full thickness distal tip L great toe  3.  Deep Tissue Pressure Injury L heel  Pressure Injury POA: Yes Measurement: see nursing flowsheet  Wound bed: 1. black eschar to L dorsal foot,5th digit and L medial foot  2.  L distal great toe appears red moist  2.  Purple maroon discoloration/erythema L heel; hyperkeratotic skin noted but no open wound  Drainage (amount, consistency, odor) appears dry other than L great toe  Periwound:  peeling hyperkeratotic skin L heel; 3rd toe appears erythematous and edematous  Dressing procedure/placement/frequency: Cleanse L heel, L dorsal foot, L 5th digit, L medial foot and L great toe with soap and water, dry and apply Xeroform gauze [place] [phone]) to wound beds daily. Cover with dry gauze and Kerlix roll gauze. Place L foot in Prevalon boot to offload pressure [place] [phone]).   POC discussed with bedside nurse. WOC team will not follow. Re-consult if further needs arise.   Thank you,    Priscella Mann MSN, RN-BC, [employer] [phone]

## 2023-04-13 NOTE — Progress Notes (Addendum)
 Refractory hypotension in the setting of sepsis Sepsis secondary to left lower extremity cellulitis and diabetic foot infection History of CHF EF 25% History of paroxysmal afibrillation - So far patient has been received 2.7 L of IV fluid bolus and currently on maintenance fluid LR 125 cc/h. -Patient has left lower extremity cellulitis diabetic foot infection, pending MRI.  Currently on broad-spectrum antibiotic coverage with IV Vanco and cefepime and metronidazole. -Since 8:30 PM most highest MAP is 61 even though multiple boluses of fluid resuscitation so far received 2.7 L and albumin 25 g - Patient has history of CHF EF 25% unable to give further IV boluses. - Lactic acid 2.7 which has been improved to 1.7.  Will recheck lactic acid again. -Consulted PCCM and spoke with Dr. Gaynell Face will see patient soon.  Tereasa Coop, MD [facility] 04/13/2023, 12:15 AM

## 2023-04-13 NOTE — Progress Notes (Signed)
 eLink Physician-Brief Progress Note Patient Name: OLOF MARCIL DOB: 1940-08-20 MRN: [id number]   Date of Service  04/13/2023  HPI/Events of Note  83 year old male with a history of temporal arteritis on chronic steroids, essential hypertension, dyslipidemia, A-fib, coronary artery disease who was recently admitted with AMS and facial droop, consulted CCM tonight due to hypotension despite LR resuscitation.  Appears that he has developed hypotension in the setting of sepsis with concern for osteomyelitis.  On examination, the patient is tachycardic, mildly hypotensive with wide pulse pressures.  Saturating 95% on room air.  Treated with albumin, minimal effect.  Metabolic panel consistent with electrolyte abnormalities-hypokalemia, hyponatremia, elevated creatinine.  INR elevated to 1.6 and normalized lactic acid.  eICU Interventions  Will replete electrolytes  Currently no indication for vasopressors, but will monitor with additional midodrine and check cortisol.  Continuous IVF  Maintains broad-spectrum renal biotics  DVT prophylaxis with Eliquis GI prophylaxis not indicated     Intervention Category Evaluation Type: New Patient Evaluation  Azaan Leask 04/13/2023, 4:48 AM

## 2023-04-14 ENCOUNTER — Inpatient Hospital Stay (HOSPITAL_COMMUNITY)

## 2023-04-14 ENCOUNTER — Other Ambulatory Visit: Payer: Self-pay

## 2023-04-14 ENCOUNTER — Ambulatory Visit

## 2023-04-14 DIAGNOSIS — R6520 Severe sepsis without septic shock: Secondary | ICD-10-CM

## 2023-04-14 DIAGNOSIS — R578 Other shock: Secondary | ICD-10-CM | POA: Diagnosis not present

## 2023-04-14 DIAGNOSIS — A419 Sepsis, unspecified organism: Principal | ICD-10-CM

## 2023-04-14 DIAGNOSIS — U071 COVID-19: Secondary | ICD-10-CM | POA: Diagnosis not present

## 2023-04-14 LAB — COOXEMETRY PANEL
Carboxyhemoglobin: 2.2 % — ABNORMAL HIGH (ref 0.5–1.5)
Methemoglobin: <0.7 % (ref 0.0–1.5)
O2 Saturation: 72 %
Total hemoglobin: 8 g/dL — ABNORMAL LOW (ref 12.0–16.0)

## 2023-04-14 LAB — ECHOCARDIOGRAM LIMITED
Height: 68 in
S' Lateral: 4.3 cm
Weight: 2253.98 [oz_av]

## 2023-04-14 LAB — GLUCOSE, CAPILLARY
Glucose-Capillary: 90 mg/dL (ref 70–99)
Glucose-Capillary: 77 mg/dL (ref 70–99)
Glucose-Capillary: 62 mg/dL — ABNORMAL LOW (ref 70–99)
Glucose-Capillary: 82 mg/dL (ref 70–99)
Glucose-Capillary: 110 mg/dL — ABNORMAL HIGH (ref 70–99)
Glucose-Capillary: 85 mg/dL (ref 70–99)
Glucose-Capillary: 113 mg/dL — ABNORMAL HIGH (ref 70–99)
Glucose-Capillary: 91 mg/dL (ref 70–99)
Glucose-Capillary: 115 mg/dL — ABNORMAL HIGH (ref 70–99)

## 2023-04-14 LAB — BASIC METABOLIC PANEL WITH GFR
Anion gap: 8 (ref 5–15)
BUN: 23 mg/dL (ref 8–23)
CO2: 21 mmol/L — ABNORMAL LOW (ref 22–32)
Calcium: 8.5 mg/dL — ABNORMAL LOW (ref 8.9–10.3)
Chloride: 105 mmol/L (ref 98–111)
Creatinine, Ser: 1.42 mg/dL — ABNORMAL HIGH (ref 0.61–1.24)
GFR, Estimated: 50 mL/min — ABNORMAL LOW (ref >60)
Glucose, Bld: 100 mg/dL — ABNORMAL HIGH (ref 70–99)
Potassium: 4 mmol/L (ref 3.5–5.1)
Sodium: 134 mmol/L — ABNORMAL LOW (ref 135–145)

## 2023-04-14 LAB — LACTIC ACID, PLASMA: Lactic Acid, Venous: 0.9 mmol/L (ref 0.5–1.9)

## 2023-04-14 LAB — VITAMIN D 25 HYDROXY (VIT D DEFICIENCY, FRACTURES): Vit D, 25-Hydroxy: 20.65 ng/mL — ABNORMAL LOW (ref 30–100)

## 2023-04-14 LAB — BRAIN NATRIURETIC PEPTIDE: B Natriuretic Peptide: 1199.9 pg/mL — ABNORMAL HIGH (ref 0.0–100.0)

## 2023-04-14 LAB — FOLATE: Folate: 7.1 ng/mL (ref >5.9)

## 2023-04-14 LAB — C-REACTIVE PROTEIN: CRP: 8.8 mg/dL — ABNORMAL HIGH (ref <1.0)

## 2023-04-14 LAB — VITAMIN B12: Vitamin B-12: 533 pg/mL (ref 180–914)

## 2023-04-14 MED ORDER — ZINC SULFATE 220 (50 ZN) MG PO CAPS
220.0000 mg | ORAL_CAPSULE | Freq: Every day | ORAL | Status: DC
  Administered 2023-04-14 – 2023-04-19 (×6): 220 mg via ORAL
  Filled 2023-04-14 (×6): qty 1

## 2023-04-14 MED ORDER — ATORVASTATIN CALCIUM 40 MG PO TABS
40.0000 mg | ORAL_TABLET | Freq: Every day | ORAL | Status: DC
  Administered 2023-04-14 – 2023-04-19 (×6): 40 mg via ORAL
  Filled 2023-04-14 (×6): qty 1

## 2023-04-14 MED ORDER — SODIUM CHLORIDE 0.9% FLUSH
10.0000 mL | Freq: Two times a day (BID) | INTRAVENOUS | Status: DC
  Administered 2023-04-14: 20 mL
  Administered 2023-04-14 – 2023-04-15 (×2): 10 mL
  Administered 2023-04-15: 20 mL
  Administered 2023-04-16 – 2023-04-19 (×7): 10 mL

## 2023-04-14 MED ORDER — ENSURE ENLIVE PO LIQD
237.0000 mL | Freq: Three times a day (TID) | ORAL | Status: DC
  Administered 2023-04-14 – 2023-04-19 (×10): 237 mL via ORAL

## 2023-04-14 MED ORDER — ALLOPURINOL 300 MG PO TABS
300.0000 mg | ORAL_TABLET | Freq: Every day | ORAL | Status: DC
  Administered 2023-04-14 – 2023-04-19 (×6): 300 mg via ORAL
  Filled 2023-04-14 (×6): qty 1

## 2023-04-14 MED ORDER — MIDODRINE HCL 5 MG PO TABS
15.0000 mg | ORAL_TABLET | Freq: Three times a day (TID) | ORAL | Status: DC
  Administered 2023-04-15 – 2023-04-19 (×15): 15 mg via ORAL
  Filled 2023-04-14 (×15): qty 3

## 2023-04-14 MED ORDER — MIRTAZAPINE 15 MG PO TABS
30.0000 mg | ORAL_TABLET | Freq: Every day | ORAL | Status: DC
Start: 2023-04-14 — End: 2023-04-20
  Administered 2023-04-14 – 2023-04-18 (×5): 30 mg via ORAL
  Filled 2023-04-14 (×5): qty 2

## 2023-04-14 MED ORDER — THIAMINE MONONITRATE 100 MG PO TABS
100.0000 mg | ORAL_TABLET | Freq: Every day | ORAL | Status: DC
  Administered 2023-04-14 – 2023-04-19 (×6): 100 mg via ORAL
  Filled 2023-04-14 (×6): qty 1

## 2023-04-14 MED ORDER — LACTATED RINGERS IV BOLUS
1000.0000 mL | Freq: Once | INTRAVENOUS | Status: AC
  Administered 2023-04-14: 1000 mL via INTRAVENOUS

## 2023-04-14 MED ORDER — ADULT MULTIVITAMIN W/MINERALS CH
1.0000 | ORAL_TABLET | Freq: Every day | ORAL | Status: DC
  Administered 2023-04-14 – 2023-04-18 (×5): 1 via ORAL
  Filled 2023-04-14 (×5): qty 1

## 2023-04-14 MED ORDER — DOXYCYCLINE HYCLATE 100 MG PO TABS
100.0000 mg | ORAL_TABLET | Freq: Two times a day (BID) | ORAL | Status: AC
  Administered 2023-04-14 – 2023-04-17 (×8): 100 mg via ORAL
  Filled 2023-04-14 (×8): qty 1

## 2023-04-14 MED ORDER — ALBUMIN HUMAN 25 % IV SOLN
25.0000 g | Freq: Four times a day (QID) | INTRAVENOUS | Status: AC
Start: 2023-04-14 — End: 2023-04-15
  Administered 2023-04-14 (×2): 25 g via INTRAVENOUS
  Administered 2023-04-15: 12.5 g via INTRAVENOUS
  Administered 2023-04-15: 25 g via INTRAVENOUS
  Filled 2023-04-14 (×4): qty 100

## 2023-04-14 MED ORDER — MIDODRINE HCL 5 MG PO TABS
5.0000 mg | ORAL_TABLET | Freq: Once | ORAL | Status: AC
Start: 2023-04-14 — End: 2023-04-14
  Administered 2023-04-14: 5 mg via ORAL
  Filled 2023-04-14: qty 1

## 2023-04-14 MED ORDER — DIPHENHYDRAMINE HCL 25 MG PO CAPS: 25.0000 mg | ORAL_CAPSULE | Freq: Once | ORAL | Status: DC

## 2023-04-14 MED ORDER — FLUDROCORTISONE ACETATE 0.1 MG PO TABS
0.2000 mg | ORAL_TABLET | Freq: Two times a day (BID) | ORAL | Status: DC
  Administered 2023-04-14 – 2023-04-19 (×11): 0.2 mg via ORAL
  Filled 2023-04-14 (×13): qty 2

## 2023-04-14 MED ORDER — VITAMIN C 500 MG PO TABS
250.0000 mg | ORAL_TABLET | Freq: Two times a day (BID) | ORAL | Status: DC
  Administered 2023-04-15 – 2023-04-19 (×9): 250 mg via ORAL
  Filled 2023-04-14 (×8): qty 1

## 2023-04-14 MED ORDER — PANTOPRAZOLE SODIUM 40 MG PO TBEC
40.0000 mg | DELAYED_RELEASE_TABLET | Freq: Every day | ORAL | Status: DC
  Administered 2023-04-14 – 2023-04-19 (×6): 40 mg via ORAL
  Filled 2023-04-14 (×6): qty 1

## 2023-04-14 MED ORDER — ACETAMINOPHEN 325 MG PO TABS: 650.0000 mg | ORAL_TABLET | Freq: Once | ORAL | Status: DC

## 2023-04-14 MED ORDER — SODIUM CHLORIDE 0.9% FLUSH: 10.0000 mL | INTRAVENOUS | Status: DC | PRN

## 2023-04-14 MED ORDER — DEXTROSE IN LACTATED RINGERS 5 % IV SOLN: INTRAVENOUS | Status: AC

## 2023-04-14 NOTE — Progress Notes (Signed)
 Initial Nutrition Assessment  DOCUMENTATION CODES:   Severe malnutrition in context of chronic illness  INTERVENTION:   Continue Liberalized Diet; pt would like benefit from smaller, more frequent meals, especially if continues with episodes of hypoglycemia. Ok for family to bring in outside food if pt desires.   Ensure Enlive po TID, each supplement provides 350 kcal and 20 grams of protein.  Given high risk and possible signs of deficiency, in addition to increase nutrient needs related to wound healing, plan to check Vitamins A, B6, B12, C, D, Folate and Zinc. Further recommendations pending results  Add MVI with Minerals Add Vitamin C 250 mg BID x 30 days and Zinc Sulfate 220 mg daily x 14 days to aid in wound healing. Plan to reassess once labs result   NUTRITION DIAGNOSIS:   Severe Malnutrition related to chronic illness as evidenced by severe muscle depletion, severe fat depletion.  GOAL:   Patient will meet greater than or equal to 90% of their needs  MONITOR:   PO intake, Supplement acceptance, Labs, Weight trends  REASON FOR ASSESSMENT:   Consult Assessment of nutrition requirement/status  ASSESSMENT:   83 yo male admitted with sepsis 2/2 LLE cellulitis in immunocompromised host, +COVID, hypoglcemic, hypotensive. PMH includes recent L fifth toe amputation (03/22/23) and recent fall with DM, PAD, CHF, COPD, CAD s/p CABG  4/01 Admitted with AMS, facial droop, hypoglycemic-mentation improved with D10 4/02 Transferred to ICU  Recorded po intake 25% of meals. Pt reports poor appetite and no taste for several months. Pt not able to give very detailed history of po diet at home.   Pt has been on Heart Healthy Carb Modified Diet with 1500 mL Fluid Restriction but has been liberalized to Regular after discussion with team.   On exam, noted tongue with smooth, shiny appearance; taste buds undetectable. Pt denies pain at this time. Question possible atrophic glossitis;  multiple vitamin deficiencies can be associated with this  Wound images noted from 4/01. Pt with recent L 5th toe amputation (03/22/23), fall since then with ongoing L foot pain. Noted multiple wounds to L foot in various stages, including a scar that per wife is from a deep wound that finally healed.   Pt with stage II x 2 and DTI to buttocks. Noted pt has been experiencing significant weakness; pt indicate he has been utilizing a wheelchair but this probably started a month ago.   Edema  present but only in LLE (location of wounds) Current wt 63.9 kg; noted weight has fluctuated up and down over the last 2 months. Pt reports he has noted weight loss and muscle loss +neuropathy  Lab Results  Component Value Date   HGBA1C 6.2 (H) 02/08/2023   Noted HgbA1c 6.2, indicating DM relatively well controlled. Admitted with CBG <50;  pt could be experiencing frequent lows which would lower HgbA1c and mask highs.   CBGs ok over the last 24 hours, 80 to 110s but currently receiving D5-LR at 50 ml/hr x 1 day. Currently only on sliding scale insulin. Need to monitor closely for hypoglycemia once dextrose infusion d/c  Labs: Sodium 134 (L) Potassium 4.0 (wdl) BUN 23 Creatinine 1.42  Meds:  SS novolog (sensitive) Midodrine Remeron   NUTRITION - FOCUSED PHYSICAL EXAM: Flowsheet Row Most Recent Value  Orbital Region Moderate depletion  Upper Arm Region Severe depletion  Thoracic and Lumbar Region Severe depletion  Buccal Region Moderate depletion  Temple Region Moderate depletion  Clavicle Bone Region Severe depletion  Clavicle and  Acromion Bone Region Severe depletion  Scapular Bone Region Severe depletion  Dorsal Hand Moderate depletion  Patellar Region Severe depletion  Anterior Thigh Region Severe depletion  Posterior Calf Region Severe depletion  Edema (RD Assessment) Mild  Hair Reviewed  Eyes Reviewed  Mouth Other (Comment)  [Smooth/shiny tongue with no visible taste buds,  ?glossitis?, poor dentition but no problems chewing]  Skin Other (Comment)  [ecchymosis, non healing wounds]  Nails Reviewed         Diet Order:   Diet Order             Diet regular Room service appropriate? Yes; Fluid consistency: Thin  Diet effective now                   EDUCATION NEEDS:   Education needs have been addressed  Skin:  Skin Assessment: Skin Integrity Issues: Skin Integrity Issues:: Stage II, DTI DTI: mid buttocks Stage II: L and R buttocks  Last BM:  4/2  Height:   Ht Readings from Last 1 Encounters:  04/12/23 5\' 8"  (1.727 m)    Weight:   Wt Readings from Last 1 Encounters:  04/13/23 63.9 kg    BMI:  Body mass index is 21.42 kg/m.  Estimated Nutritional Needs:   Kcal:  1800-2000 kcals  Protein:  80-95 g  Fluid:  1.8 L    Romelle Starcher MS, RDN, LDN, CNSC Registered Dietitian 3 Clinical Nutrition RD Inpatient Contact Info in Amion

## 2023-04-14 NOTE — Inpatient Diabetes Management (Signed)
 Inpatient Diabetes Program Recommendations  AACE/ADA: New Consensus Statement on Inpatient Glycemic Control (2015)  Target Ranges:  Prepandial:   less than 140 mg/dL      Peak postprandial:   less than 180 mg/dL (1-2 hours)      Critically ill patients:  140 - 180 mg/dL   Lab Results  Component Value Date   GLUCAP 77 04/13/2023   HGBA1C 6.2 (H) 02/08/2023    Review of Glycemic Control  Diabetes history: DM2 Outpatient Diabetes medications: Jardiance 10 QAM, glipizide 5 mg daily, Fiasp 2-10 units ac meals Current orders for Inpatient glycemic control: Novolog 0-9 TID with meals  Continues with poor po intake  Inpatient Diabetes Program Recommendations:   Agree with orders.  Consider discontinuing glipizide (home meds) to prevent hypos.  Follow.  Thank you. Ailene Ards, RD, LDN, CDCES Inpatient Diabetes Coordinator [phone]

## 2023-04-14 NOTE — Progress Notes (Signed)
 NAME:  Donald Rios, MRN:  [id number], DOB:  Dec 22, 1940, LOS: 1 ADMISSION DATE:  04/12/2023, CONSULTATION DATE:  04/13/23 REFERRING MD:  [name] , CHIEF COMPLAINT:  hypotension   History of Present Illness:  83 yo  PMH HTN HLD CAD Afib GERD PAD COPD HFrEF who was admitted to [facility] 4/1 after presenting w AMS and facial droop. CBG was <50, rcvd d10 and mentation improved. Recent L fifth toe amputation, has had a fall since then + ongoing L foot pain. Soft Bps in ED, given 2L IVF and started on broad abx.  4/1-4/2 night pt w ongoing low BP, SBP 90s and MAPs dipping <65. He rcvd addition 2L LR and is on mIVF.   PCCM is consulted in this setting   Pertinent  Medical History  HFrEF Cad COPD Afib GERD PAD   Significant Hospital Events: Including procedures, antibiotic start and stop dates in addition to other pertinent events   4/1 admit to [facility] broad abx c/f cellulitis L foot  4/2 PCCM consult for low NIBP readings, txf to ICU  4/3 BP still low normal. getting a PICC   Interim History / Subjective:  Had a LA 2.2 then 0.7 overnight BP stable soft   No am labs   Objective   Blood pressure (!) 99/53, pulse 98, temperature 98.5 F (36.9 C), temperature source Oral, resp. rate 19, height 5\' 8"  (1.727 m), weight 63.9 kg, SpO2 96%.        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 CRP 18 (ESR  also elevated, in setting of underlying rheum dx, difficult to attribute to infection), Procal 0.5,  White count is down to 8.8. no fever. UA neg.  Plan -de-escalate abx to doxy for add'l 4d   Hypotension -poor PO intake PTA (rt pain from foot, nausea), underlying HFrEF, possible sepsis, recently completed incr dose steroid course r/t his temporal arteritis and subsequently not on steroids -LA WNL, not really in shock. Numbers haven't changed much despite TRH IVF resusc. 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 Monthly actemra.  -CRP and ESR are elevated now -- 19 and 131 respectively -- more so than at last rheum visit when his pred was incr  Plan -probably will need a pred course, waiting for coox as above   Recent Fall T12 fx Plan -PT/OT, supportive care -PRN analgesia   Dispo: -anticipate likely transfer out of ICU 4/3  Best Practice (right click and "Reselect all SmartList Selections" daily)   Diet/type: Regular consistency (see orders) DVT prophylaxis DOAC Pressure ulcer(s): present on admission  L heel  GI prophylaxis: N/A Lines: N/A Foley:  N/A Code Status:  full code Last date of multidisciplinary goals of care discussion  [--]  Critical care time NA    High mdm   Tessie Fass MSN, AGACNP-BC [facility] Pulmonary/Critical Care Medicine Amion for pager  04/14/2023, 10:46 AM

## 2023-04-14 NOTE — Progress Notes (Signed)
 eLink Physician-Brief Progress Note Patient Name: KENROY TIMBERMAN DOB: April 18, 1940 MRN: [id number]   Date of Service  04/14/2023  HPI/Events of Note  83 year old with heart failure with reduced ejection fraction who initially came in with CVA symptoms that eventually improved there was transferred to the floor today.  Last night the patient had ongoing low blood pressures with maps dipping under 65 and he received IV fluids with appropriate response.  Blood pressure appropriate throughout the day but borderline throughout with wide pulse pressures.  Now, MAP of 64 and stable tachycardia.  No other acute symptoms.  eICU Interventions  Non camera room  Will increase dose of midodrine with additional 5 mg now.  Fortunately, given the wide pulse pressures, this is less worrisome unless of MAP is sustained less than 60 or the patient becomes symptomatic, likely no other intervention at this time   0519 -morning labs assessed, 1 g drop in hemoglobin over the past 2 days.  Hemoglobin 6.4 without active signs of bleeding.  Mild electrolyte disturbances.  Will transfuse 1 unit PRBC, replete electrolytes.  Intervention Category Intermediate Interventions: Hypotension - evaluation and management  Nathon Stefanski 04/14/2023, 7:54 PM

## 2023-04-14 NOTE — Progress Notes (Signed)
 Continued hypotension with BP In the 90's not meeting map goal . Currently 64. Afib with heart rate 110-120's contact elink and spoke to June. Who states that's where he's been running but will inform [facility] MD

## 2023-04-14 NOTE — Progress Notes (Signed)
 Peripherally Inserted Central Catheter Placement  The IV Nurse has discussed with the patient and/or persons authorized to consent for the patient, the purpose of this procedure and the potential benefits and risks involved with this procedure.  The benefits include less needle sticks, lab draws from the catheter, and the patient may be discharged home with the catheter. Risks include, but not limited to, infection, bleeding, blood clot (thrombus formation), and puncture of an artery; nerve damage and irregular heartbeat and possibility to perform a PICC exchange if needed/ordered by physician.  Alternatives to this procedure were also discussed.  Bard Power PICC patient education guide, fact sheet on infection prevention and patient information card has been provided to patient /or left at bedside.    Consent obtained with wife via telephone  PICC Placement Documentation  PICC Double Lumen 04/14/23 Left Brachial 37 cm 0 cm (Active)  Indication for Insertion or Continuance of Line Chronic illness with exacerbations (CF, Sickle Cell, etc.) 04/14/23 1300  Exposed Catheter (cm) 0 cm 04/14/23 1300  Site Assessment Clean, Dry, Intact 04/14/23 1300  Lumen #1 Status Flushed;Saline locked;Blood return noted 04/14/23 1300  Lumen #2 Status Flushed;Saline locked;Blood return noted 04/14/23 1300  Dressing Type Transparent;Securing device 04/14/23 1300  Dressing Status Antimicrobial disc/dressing in place;Clean, Dry, Intact 04/14/23 1300  Line Care Connections checked and tightened 04/14/23 1300  Line Adjustment (NICU/IV Team Only) No 04/14/23 1300  Dressing Intervention New dressing 04/14/23 1300  Dressing Change Due 04/21/23 04/14/23 1300       Franne Grip Renee 04/14/2023, 1:03 PM

## 2023-04-15 ENCOUNTER — Encounter: Payer: Self-pay | Admitting: *Deleted

## 2023-04-15 DIAGNOSIS — E43 Unspecified severe protein-calorie malnutrition: Secondary | ICD-10-CM | POA: Insufficient documentation

## 2023-04-15 DIAGNOSIS — A419 Sepsis, unspecified organism: Secondary | ICD-10-CM | POA: Diagnosis not present

## 2023-04-15 LAB — GLUCOSE, CAPILLARY
Glucose-Capillary: 113 mg/dL — ABNORMAL HIGH (ref 70–99)
Glucose-Capillary: 177 mg/dL — ABNORMAL HIGH (ref 70–99)
Glucose-Capillary: 135 mg/dL — ABNORMAL HIGH (ref 70–99)
Glucose-Capillary: 109 mg/dL — ABNORMAL HIGH (ref 70–99)

## 2023-04-15 LAB — CBC
HCT: 20.3 % — ABNORMAL LOW (ref 39.0–52.0)
Hemoglobin: 6.4 g/dL — CL (ref 13.0–17.0)
MCH: 31.7 pg (ref 26.0–34.0)
MCHC: 31.5 g/dL (ref 30.0–36.0)
MCV: 100.5 fL — ABNORMAL HIGH (ref 80.0–100.0)
Platelets: 171 10*3/uL (ref 150–400)
RBC: 2.02 MIL/uL — ABNORMAL LOW (ref 4.22–5.81)
RDW: 18.1 % — ABNORMAL HIGH (ref 11.5–15.5)
WBC: 7.3 10*3/uL (ref 4.0–10.5)
nRBC: 0 % (ref 0.0–0.2)

## 2023-04-15 LAB — BASIC METABOLIC PANEL WITH GFR
Anion gap: 7 (ref 5–15)
BUN: 22 mg/dL (ref 8–23)
CO2: 22 mmol/L (ref 22–32)
Calcium: 8.5 mg/dL — ABNORMAL LOW (ref 8.9–10.3)
Chloride: 107 mmol/L (ref 98–111)
Creatinine, Ser: 1.17 mg/dL (ref 0.61–1.24)
GFR, Estimated: >60 mL/min (ref >60)
Glucose, Bld: 143 mg/dL — ABNORMAL HIGH (ref 70–99)
Potassium: 3.6 mmol/L (ref 3.5–5.1)
Sodium: 136 mmol/L (ref 135–145)

## 2023-04-15 LAB — IRON AND TIBC: Iron: 21 ug/dL — ABNORMAL LOW (ref 45–182)

## 2023-04-15 LAB — FERRITIN: Ferritin: 1603 ng/mL — ABNORMAL HIGH (ref 24–336)

## 2023-04-15 LAB — MAGNESIUM: Magnesium: 1.9 mg/dL (ref 1.7–2.4)

## 2023-04-15 LAB — PHOSPHORUS: Phosphorus: 1.9 mg/dL — ABNORMAL LOW (ref 2.5–4.6)

## 2023-04-15 LAB — PREPARE RBC (CROSSMATCH)

## 2023-04-15 MED ORDER — SODIUM CHLORIDE 0.9% IV SOLUTION: Freq: Once | INTRAVENOUS | Status: DC

## 2023-04-15 MED ORDER — CALCIUM GLUCONATE-NACL 2-0.675 GM/100ML-% IV SOLN
2.0000 g | Freq: Once | INTRAVENOUS | Status: AC
  Administered 2023-04-15: 2000 mg via INTRAVENOUS
  Filled 2023-04-15: qty 100

## 2023-04-15 MED ORDER — MAGNESIUM SULFATE 2 GM/50ML IV SOLN
2.0000 g | Freq: Once | INTRAVENOUS | Status: AC
  Administered 2023-04-15: 2 g via INTRAVENOUS
  Filled 2023-04-15: qty 50

## 2023-04-15 MED ORDER — POTASSIUM PHOSPHATES 15 MMOLE/5ML IV SOLN
30.0000 mmol | Freq: Once | INTRAVENOUS | Status: AC
  Administered 2023-04-15: 30 mmol via INTRAVENOUS
  Filled 2023-04-15: qty 10

## 2023-04-15 MED ORDER — VITAMIN D 25 MCG (1000 UNIT) PO TABS
1000.0000 [IU] | ORAL_TABLET | Freq: Every day | ORAL | Status: DC
  Administered 2023-04-15 – 2023-04-19 (×5): 1000 [IU] via ORAL
  Filled 2023-04-15 (×5): qty 1

## 2023-04-15 NOTE — Evaluation (Signed)
 Physical Therapy Evaluation Patient Details Name: Donald Rios MRN: [id number] DOB: 12/22/1940 Today's Date: 04/15/2023  History of Present Illness  83 y.o. male presents to [facility] hospital on 04/12/2023 with AMS and facial droop, found to be hypoglycemic. Pt with recent L fifth toe amputation (03/22/2023), concern for cellulitis. Pt also with recent fall and T12 fx. Pt also COVID positive. 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  Clinical Impression  Pt presents to PT with deficits in functional mobility, gait, balance, strength, power, endurance, cognition. Pt requires physical assistance for all aspects of functional mobility during this session and is unable to ambulate due to generalized weakness. Pt requires significant physical assistance to transfer between surfaces, PT is unable to visualize L foot during transfers to accurately determine the pt's ability to maintain WB precautions. Patient will benefit from continued inpatient follow up therapy, <3 hours/day.        If plan is discharge home, recommend the following: A lot of help with walking and/or transfers;A lot of help with bathing/dressing/bathroom;Assistance with cooking/housework;Assist for transportation;Help with stairs or ramp for entrance   Can travel by private vehicle   No    Equipment Recommendations None recommended by PT  Recommendations for Other Services       Functional Status Assessment Patient has had a recent decline in their functional status and demonstrates the ability to make significant improvements in function in a reasonable and predictable amount of time.     Precautions / Restrictions Precautions Precautions: Fall Recall of Precautions/Restrictions: Intact Precaution/Restrictions Comments: pt with documentation for heel WB in post-op shoe after recent toe amputation in early  march, no updates to WB status seen. No post-op shoe present at this time, pt limited to transfers Restrictions Weight Bearing Restrictions Per Provider Order: Yes LLE Weight Bearing Per Provider Order:  (heel weightbearing in post-op shoe since L fifth toe amputation on 03/22/2023)      Mobility  Bed Mobility Overal bed mobility: Needs Assistance Bed Mobility: Supine to Sit     Supine to sit: Min assist, HOB elevated          Transfers Overall transfer level: Needs assistance Equipment used: 1 person hand held assist Transfers: Bed to chair/wheelchair/BSC       Squat pivot transfers: Mod assist     General transfer comment: pt transfers from bed to [facility] and then [facility] to recliner with PT face to face assist. PT unable to visualize pt's ability to maintain heel WB through LLE during transfers due to close proximity to the patient for safety during transfers    Ambulation/Gait                  Stairs            Wheelchair Mobility     Tilt Bed    Modified Rankin (Stroke Patients Only)       Balance Overall balance assessment: Needs assistance Sitting-balance support: No upper extremity supported, Feet supported Sitting balance-Leahy Scale: Fair     Standing balance support: Bilateral upper extremity supported, Reliant on assistive device for balance Standing balance-Leahy Scale: Poor                               Pertinent Vitals/Pain Pain Assessment Pain Assessment: Faces Faces Pain Scale: Hurts little more Pain  Location: L foot Pain Descriptors / Indicators: Sore Pain Intervention(s): Monitored during session    Home Living Family/patient expects to be discharged to:: Private residence Living Arrangements: Spouse/significant other Available Help at Discharge: Family Type of Home: House Home Access: Stairs to enter Entrance Stairs-Rails:  (column the pt utilizes for UE support) Entrance Stairs-Number of Steps: 2 steps to the  sidewalk and walks with cane to car at times when going to doctor   Home Layout: One level;Laundry or work area in basement (pt reports having a stair lift to the basement and one at another entrance to the home) Home Equipment: Wheelchair - manual;Cane - single point;Other (comment);BSC/3in1;Lift chair      Prior Function Prior Level of Function : Independent/Modified Independent;Driving             Mobility Comments: pt was independently mobilizing within the home with use of RW vs MWC prior to L foot infection and toe amputation ADLs Comments: wife helping to sponge bathe and dress (setup assist)     Extremity/Trunk Assessment   Upper Extremity Assessment Upper Extremity Assessment: Generalized weakness    Lower Extremity Assessment Lower Extremity Assessment: Generalized weakness    Cervical / Trunk Assessment Cervical / Trunk Assessment: Normal  Communication   Communication Communication: No apparent difficulties    Cognition Arousal: Alert Behavior During Therapy: WFL for tasks assessed/performed   PT - Cognitive impairments: No family/caregiver present to determine baseline, Memory, Safety/Judgement                       PT - Cognition Comments: pt reports an eye infection eventually led to his left foot infection and toe amputation Following commands: Intact       Cueing Cueing Techniques: Verbal cues     General Comments General comments (skin integrity, edema, etc.): VSS on RA, pt incontinent of stool upon PT arrival to room. PT assists with hygiene tasks. Pt reports the urge to defecate and is assisted to [facility] but unable to have a bowel movement.    Exercises     Assessment/Plan    PT Assessment Patient needs continued PT services  PT Problem List Decreased strength;Decreased activity tolerance;Decreased balance;Decreased mobility;Decreased knowledge of use of DME;Decreased knowledge of precautions;Pain       PT Treatment Interventions  DME instruction;Gait training;Stair training;Functional mobility training;Therapeutic activities;Therapeutic exercise;Balance training;Neuromuscular re-education;Cognitive remediation;Wheelchair mobility training;Patient/family education    PT Goals (Current goals can be found in the Care Plan section)  Acute Rehab PT Goals Patient Stated Goal: to return to ambulation PT Goal Formulation: With patient Time For Goal Achievement: 04/29/23 Potential to Achieve Goals: Fair    Frequency Min 2X/week     Co-evaluation               AM-PAC PT "6 Clicks" Mobility  Outcome Measure Help needed turning from your back to your side while in a flat bed without using bedrails?: A Little Help needed moving from lying on your back to sitting on the side of a flat bed without using bedrails?: A Little Help needed moving to and from a bed to a chair (including a wheelchair)?: A Lot Help needed standing up from a chair using your arms (e.g., wheelchair or bedside chair)?: A Lot Help needed to walk in hospital room?: Total Help needed climbing 3-5 steps with a railing? : Total 6 Click Score: 12    End of Session Equipment Utilized During Treatment: Gait belt Activity Tolerance: Patient tolerated treatment  well Patient left: in chair;with call bell/phone within reach;with chair alarm set Nurse Communication: Mobility status PT Visit Diagnosis: Other abnormalities of gait and mobility (R26.89);Muscle weakness (generalized) (M62.81)    Time: 1610-9604 PT Time Calculation (min) (ACUTE ONLY): 37 min   Charges:   PT Evaluation $PT Eval Low Complexity: 1 Low PT Treatments $Therapeutic Activity: 8-22 mins PT General Charges $$ ACUTE PT VISIT: 1 Visit         Arlyss Gandy, PT, DPT Acute Rehabilitation Office [phone]   Arlyss Gandy 04/15/2023, 1:44 PM

## 2023-04-15 NOTE — Evaluation (Signed)
 Occupational Therapy Evaluation Patient Details Name: Donald Rios MRN: [id number] DOB: Sep 03, 1940 Today's Date: 04/15/2023   History of Present Illness   83 y.o. male presents to [facility] hospital on 04/12/2023 with AMS and facial droop, found to be hypoglycemic. Pt with recent L fifth toe amputation (03/22/2023), concern for cellulitis. Pt also with recent fall and T12 fx. Pt also COVID positive. 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     Clinical Impressions PTA, pt lives with spouse, reports using wheelchair for mobility primarily now but able to manage transfers with use of lift chair. Pt reports wife providing light assist for ADLs since recent toe amputation. Pt presents now with deficits in strength, balance and endurance. Pt required heavy Mod A to transfer back to bed using Stedy, Min A for UB ADLs and up to Total A for LB ADLs (would need +2 if in standing). Based on deficits and at high risk for falls, recommend consideration of continued inpatient follow up therapy, <3 hours/day at DC.      If plan is discharge home, recommend the following:   A lot of help with walking and/or transfers;Two people to help with walking and/or transfers;A lot of help with bathing/dressing/bathroom;Two people to help with bathing/dressing/bathroom     Functional Status Assessment   Patient has had a recent decline in their functional status and demonstrates the ability to make significant improvements in function in a reasonable and predictable amount of time.     Equipment Recommendations   None recommended by OT     Recommendations for Other Services         Precautions/Restrictions   Precautions Precautions: Fall Recall of Precautions/Restrictions: Intact Precaution/Restrictions Comments: pt with documentation for heel WB in post-op shoe after recent toe  amputation in early march, no updates to WB status seen. No post-op shoe present at this time, pt limited to transfers Restrictions Weight Bearing Restrictions Per Provider Order: Yes LLE Weight Bearing Per Provider Order: Partial weight bearing (heel weightbearing in post-op shoe since L fifth toe amputation on 03/22/2023)     Mobility Bed Mobility Overal bed mobility: Needs Assistance Bed Mobility: Sit to Supine       Sit to supine: Mod assist   General bed mobility comments: assist to lift BLE back to bed    Transfers Overall transfer level: Needs assistance Equipment used: Ambulation equipment used Transfers: Sit to/from Stand Sit to Stand: Mod assist, Via lift equipment           General transfer comment: Heavy Mod A to stand from lower recliner surface and transfer back to bed. Transfer via Lift Equipment: Stedy    Balance Overall balance assessment: Needs assistance Sitting-balance support: No upper extremity supported, Feet supported Sitting balance-Leahy Scale: Fair     Standing balance support: Bilateral upper extremity supported, Reliant on assistive device for balance Standing balance-Leahy Scale: Poor                             ADL either performed or assessed with clinical judgement   ADL Overall ADL's : Needs assistance/impaired Eating/Feeding: Independent   Grooming: Set up;Sitting   Upper Body Bathing: Sitting;Minimal assistance   Lower Body Bathing: Maximal assistance;Sitting/lateral leans;Bed level;+2 for safety/equipment;Sit to/from stand   Upper Body Dressing : Minimal assistance;Sitting   Lower Body Dressing: Maximal  assistance;Sitting/lateral leans;Bed level;+2 for safety/equipment;Sit to/from stand       Toileting- Architect and Hygiene: Total assistance;Sitting/lateral lean;Bed level;Sit to/from stand;+2 for safety/equipment         General ADL Comments: +2 for LB ADLs attempted in standing due to balance  and strength deficits     Vision Baseline Vision/History: 1 Wears glasses Ability to See in Adequate Light: 0 Adequate Patient Visual Report: No change from baseline Vision Assessment?: No apparent visual deficits     Perception         Praxis         Pertinent Vitals/Pain Pain Assessment Pain Assessment: Faces Faces Pain Scale: Hurts little more Pain Location: L foot Pain Descriptors / Indicators: Sore Pain Intervention(s): Monitored during session     Extremity/Trunk Assessment Upper Extremity Assessment Upper Extremity Assessment: Generalized weakness;Right hand dominant   Lower Extremity Assessment Lower Extremity Assessment: Defer to PT evaluation   Cervical / Trunk Assessment Cervical / Trunk Assessment: Normal   Communication Communication Communication: Impaired Factors Affecting Communication: Hearing impaired   Cognition Arousal: Alert Behavior During Therapy: WFL for tasks assessed/performed Cognition: No family/caregiver present to determine baseline             OT - Cognition Comments: likely near baseline                 Following commands: Intact       Cueing  General Comments   Cueing Techniques: Gestural cues;Verbal cues  VSS on RA   Exercises     Shoulder Instructions      Home Living Family/patient expects to be discharged to:: Private residence Living Arrangements: Spouse/significant other Available Help at Discharge: Family Type of Home: House Home Access: Stairs to enter [employer] of Steps: 2 steps to the sidewalk and walks with cane to car at times when going to doctor Entrance Stairs-Rails:  (column the pt utilizes for UE support) Home Layout: One level;Laundry or work area in basement (pt reports having a stair lift to the basement and one at another entrance to the home)     Bathroom Shower/Tub: Sponge bathes at baseline   [employer]: Standard     Home Equipment: Wheelchair -  [employer] - single point;BSC/3in1;Lift chair          Prior Functioning/Environment Prior Level of Function : Independent/Modified Independent;Driving;History of Falls (last six months)             Mobility Comments: pt was independently mobilizing within the home with use of RW vs MWC prior to L foot infection and toe amputation. since amputation, pt has been using wheelchair for mobility and using lift chair for transfers. noted documented fall attempting to manage stairs at home on 3/11 after DC home s/p toe amputation ADLs Comments: wife helping to sponge bathe and dress (setup assist)    OT Problem List: Decreased strength;Decreased activity tolerance;Impaired balance (sitting and/or standing);Decreased knowledge of precautions;Decreased knowledge of use of DME or AE   OT Treatment/Interventions: Therapeutic exercise;Self-care/ADL training;Energy conservation;DME and/or AE instruction;Therapeutic activities;Patient/family education;Balance training      OT Goals(Current goals can be found in the care plan section)   Acute Rehab OT Goals Patient Stated Goal: regain strength OT Goal Formulation: With patient Time For Goal Achievement: 04/29/23 Potential to Achieve Goals: Good ADL Goals Pt Will Perform Lower Body Bathing: with mod assist;sit to/from stand;sitting/lateral leans Pt Will Perform Lower Body Dressing: with mod assist;sit to/from stand;sitting/lateral leans Pt Will Transfer to Toilet: with  min assist;stand pivot transfer;squat pivot transfer;bedside commode Pt Will Perform Toileting - Clothing Manipulation and hygiene: with mod assist;sit to/from stand;sitting/lateral leans   OT Frequency:  Min 2X/week    Co-evaluation              AM-PAC OT "6 Clicks" Daily Activity     Outcome Measure Help from another person eating meals?: None Help from another person taking care of personal grooming?: A Little Help from another person toileting, which includes  using toliet, bedpan, or urinal?: Total Help from another person bathing (including washing, rinsing, drying)?: A Lot Help from another person to put on and taking off regular upper body clothing?: A Little Help from another person to put on and taking off regular lower body clothing?: Total 6 Click Score: 14   End of Session Equipment Utilized During Treatment: Gait belt Nurse Communication: Mobility status (discussed with NT)  Activity Tolerance: Patient tolerated treatment well Patient left: in bed;with call bell/phone within reach;with bed alarm set  OT Visit Diagnosis: Unsteadiness on feet (R26.81);Other abnormalities of gait and mobility (R26.89);Muscle weakness (generalized) (M62.81)                Time: 4401-0272 OT Time Calculation (min): 27 min Charges:  OT General Charges $OT Visit: 1 Visit OT Evaluation $OT Eval Moderate Complexity: 1 Mod OT Treatments $Therapeutic Activity: 8-22 mins  Bradd Canary, OTR/L Acute Rehab Services Office: [phone]   Lorre Munroe 04/15/2023, 2:26 PM

## 2023-04-15 NOTE — Progress Notes (Addendum)
 PROGRESS NOTE    Donald Rios  ZOX:[id number] DOB: [birth year] DOA: 04/12/2023 PCP: [name], MD    Brief Narrative:   Donald Rios is a 83 y.o. male with past medical history significant for chronic systolic congestive heart failure, HTN, HLD, CAD s/p CABG, paroxysmal atrial fibrillation, DM 2, PAD/PVD s/p amputation left fifth toe by vascular surgery 3/11, anemia of chronic medical disease, gout, COPD who presented to [facility] ED on 04/12/2023 from home via EMS with confusion, generalized weakness, facial droop.  On EMS arrival, patient was altered and noted to have a CBG of 49; D10 was administered with improvement of patient's glucose and mentation.  Patient reported some nausea and decreased oral intake.  Recently reported amputation of left fifth toe a few weeks prior and has been doing well since.  States he thinks he is on some antibiotics still.  Does have ongoing erythema/wound at left heel, also with complaints of cough over the last week.  Denies fever, no chills, no chest pain, no shortness of breath, no abdominal pain, no constipation.  In the ED, temperature 97.5 F, BP 80s-110 systolic, heart rate 100s-130s, RR teens-20s, on room air.  Sodium 134, CO2 19, BUN 34, creatinine 2.65, glucose 199, calcium 8.8, protein 6.1, AST 66, alkaline phosphatase 139.  WBC 17.2, hemoglobin 9.9, INR 1.7.  Lactic acid 4.6> 2.7.  COVID-19 positive.  Chest x-ray without acute abnormality.  Left foot x-ray without acute abnormality.  Blood cultures and urinalysis ordered.  Patient received vancomycin, cefepime, Flagyl in the ED and 2 L IV fluid bolus.  TRH consulted for admission for concerns of sepsis secondary to left lower extremity cellulitis with recent left fifth toe amputation.  Significant hospital events: 4/1: admit to [facility] abx c/f cellulitis L foot  4/2: PCCM consult for low NIBP readings, txf to ICU  4/3: BP still low normal. getting a PICC  4/4: transfer back to  [facility]  Assessment & Plan:   Sepsis, POA Left lower extremity cellulitis Patient presenting with redness to his left foot concerning for cellulitis in the setting of recent amputation of his fifth left digit.  MR left heel without contrast with no evidence of osteomyelitis, diffuse nonspecific edema intrinsic musculature of the foot reflective of infectious myositis or myopathy, no intramuscular collection identified, soft tissue irregularity posterior heel with nonspecific subcu edema consistent with cellulitis without fluid collection, mild tenosynovitis posterior tibialis tendon, chronic plantar fasciitis, distal Achilles tendinosis.  Initially started on empiric antibiotics with vancomycin, cefepime, metronidazole and now de-escalated to doxycycline at time of transfer from intensive care unit/PCCM. -- WBC 17.2>8.8>7.3 -- Blood Cultures x 2 4/1: no growth x 3 days -- Doxycycline 100 mg p.o. twice daily -- Repeat CBC in a.m.  Covid-19 viral infection without pneumonia Chest x-ray with no acute cardiopulmonary disease process. -- Continue airborne/contact isolation precautions  Hypotension Hx Chronic systolic congestive heart failure, compensated Hx Essential hypertension Hx Paroxysmal atrial fibrillation At baseline on metoprolol tartrate centimeters p.o. twice daily, furosemide 40 mg p.o. twice daily, spironolactone 25 mg p.o. twice daily, amiodarone 200 mg p.o. daily. Patient with hypotension during hospitalization requiring transfer to the ICU, now transferred back to floor.  Likely secondary to sepsis as above versus adrenal insufficiency given previous history of longstanding use of steroids which were tapered off by rheumatology with history of temporal arteritis.  Cortisol level within normal limits. -- Holding home metoprolol, furosemide, spironolactone -- s/p albumin 25 g IV every 6 hours  x 4 doses -- Started on fludrocortisone 0.2 mg p.o. twice daily by PCCM -- Midodrine 50 mg p.o.  3 times daily -- Continue amiodarone 200 mg p.o. daily -- Hold Eliquis for anemia; may resume in the next 1-2 days if hemoglobin remains stable (no signs of GI blood loss) -- Continue to monitor BP closely  CAD s/p CABG -- Crestor 40 g p.o. daily  Acute metabolic encephalopathy/hypoglycemia: resolved DM2 Hemoglobin A1c 6.2 on 02/08/2023, well-controlled.  At baseline on Jardiance, glipizide 5 mg p.o. daily, and insulin aspart sliding scale.  On EMS arrival, patient was noted to be hypoglycemic and received D10. -- SSI for coverage -- CBG before every meal/at bedtime  COPD On room air, no acute exacerbation.  Not on inhalers outpatient. -- Albuterol neb as needed wheezing/shortness of breath  GERD -- Protonix 40 mg p.o. daily  Vitamin D deficiency Vitamin D 25-hydroxy level low at 20.65. -- started on Cholecalciferol 1000 units PO daily  Acute on chronic anemia Anemia panel with iron 21, ferritin 1603, folate 7.1, vitamin B12 533 -- hgb 9.9>10.9>7.5>6.4 -- Transfuse 1 unit PRBC today -- Holding Eliquis as above -- Repeat H&H 2 hours following transfusion, CBC in a.m.  Gout -- Allopurinol 300 mg p.o. daily  Hx temporal arteritis Follows with rheumatology outpatient, Dr. Dimple Casey.  Currently on Actemra every 4 weeks.  Titrated off of prednisone.  Left foot wound, POA PVD/PAD s/p left fifth toe amputation Recent left fifth digit amputation by vascular surgery on 03/22/2023.  Patient with noted to have full-thickness wound to left dorsal foot, fifth digit, left medial foot.  Seen by wound RN. -- Cleanse L heel, L dorsal foot, L 5th digit, L medial foot and L great toe with soap and water, dry and apply Xeroform gauze to wound beds daily. Cover with dry gauze and Kerlix roll gauze. Place L foot in Prevalon boot to offload pressure  Weakness/debility/deconditioning Recent fall T12 fracture -- Continue PT/OT; recommending SNF placement -- TOC consult  DVT prophylaxis: Holding  anticoagulation due to drop in hemoglobin    Code Status: Full Code Family Communication: No family present at bedside this morning  Disposition Plan:  Level of care: Progressive Status is: Inpatient Remains inpatient appropriate because: Transfusing 1 unit PRBC, will need SNF placement    Consultants:  PCCM: Signed off 4/4  Procedures:  None  Antimicrobials:  Vancomycin 4/1-/1 Metronidazole 4/1-/3 Cefepime 4/1-4/3 Doxycycline 4/3>>   Subjective: Patient seen examined bedside, lying in bed.  No complaints this morning.  Blood pressure improved but remains on fludrocortisone and midodrine.  Denies headache, no chest pain, no palpitations, no shortness of breath, no abdominal pain, no fever/chills/night sweats, no nausea/vomiting/diarrhea, no focal weakness, no fatigue, no paresthesias.  No acute events overnight per nurse staff.  Objective: Vitals:   04/15/23 0411 04/15/23 0738 04/15/23 1110 04/15/23 1338  BP:  (!) 104/59 (!) 100/47 105/61  Pulse:  93 91 (!) 106  Resp:  15 20 (!) 24  Temp:  98.5 F (36.9 C) 97.6 F (36.4 C) 97.9 F (36.6 C)  TempSrc:  Axillary Oral Oral  SpO2:  99% 100% 100%  Weight: 66.2 kg     Height:        Intake/Output Summary (Last 24 hours) at 04/15/2023 1413 Last data filed at 04/15/2023 0555 Gross per 24 hour  Intake 243.52 ml  Output 775 ml  Net -531.48 ml   Filed Weights   04/12/23 0949 04/13/23 0332 04/15/23 0411  Weight: 67 kg  63.9 kg 66.2 kg    Examination:  Physical Exam: GEN: NAD, alert and oriented x 3, wd/wn HEENT: NCAT, PERRL, EOMI, sclera clear, MMM PULM: CTAB w/o wheezes/crackles, normal respiratory effort, on room air CV: RRR w/o M/G/R GI: abd soft, NTND, NABS, no R/G/M MSK: Left lower extremity/foot noted wound/fifth metatarsal amputation with Kerlix wrap in place, clean/dry/intact NEURO: CN II-XII intact, no focal deficits, sensation to light touch intact PSYCH: normal mood/affect Integumentary: Left foot/lower  extremity as above, otherwise no other concerning rashes/lesions/wounds noted on exposed skin surfaces             Data Reviewed: I have personally reviewed following labs and imaging studies  CBC: Recent Labs  Lab 04/12/23 1040 04/12/23 1056 04/13/23 0808 04/15/23 0400  WBC 17.2*  --  8.8 7.3  NEUTROABS 16.2*  --   --   --   HGB 9.9* 10.9* 7.5* 6.4*  HCT 32.2* 32.0* 24.0* 20.3*  MCV 104.2*  --  101.7* 100.5*  PLT 228  --  167 171   Basic Metabolic Panel: Recent Labs  Lab 04/12/23 1038 04/12/23 1056 04/13/23 0319 04/14/23 1410 04/15/23 0400  NA 134* 132* 134* 134* 136  K 3.6 3.5 3.0* 4.0 3.6  CL 98 99 104 105 107  CO2 19*  --  19* 21* 22  GLUCOSE 199* 195* 78 100* 143*  BUN 34* 31* 29* 23 22  CREATININE 2.65* 2.60* 1.91* 1.42* 1.17  CALCIUM 8.8*  --  8.0* 8.5* 8.5*  MG  --   --   --   --  1.9  PHOS  --   --   --   --  1.9*   GFR: Estimated Creatinine Clearance: 46.4 mL/min (by C-G formula based on SCr of 1.17 mg/dL). Liver Function Tests: Recent Labs  Lab 04/12/23 1038 04/13/23 0319  AST 67* 52*  ALT 34 27  ALKPHOS 139* 99  BILITOT 0.9 0.9  PROT 6.1* 5.1*  ALBUMIN 2.3* 2.3*   No results for input(s): "LIPASE", "AMYLASE" in the last 168 hours. No results for input(s): "AMMONIA" in the last 168 hours. Coagulation Profile: Recent Labs  Lab 04/12/23 1040 04/13/23 0319  INR 1.7* 1.6*   Cardiac Enzymes: No results for input(s): "CKTOTAL", "CKMB", "CKMBINDEX", "TROPONINI" in the last 168 hours. BNP (last 3 results) No results for input(s): "PROBNP" in the last 8760 hours. HbA1C: No results for input(s): "HGBA1C" in the last 72 hours. CBG: Recent Labs  Lab 04/14/23 1642 04/14/23 1716 04/14/23 2045 04/15/23 0607 04/15/23 1108  GLUCAP 91 113* 115* 113* 177*   Lipid Profile: No results for input(s): "CHOL", "HDL", "LDLCALC", "TRIG", "CHOLHDL", "LDLDIRECT" in the last 72 hours. Thyroid Function Tests: No results for input(s): "TSH",  "T4TOTAL", "FREET4", "T3FREE", "THYROIDAB" in the last 72 hours. Anemia Panel: Recent Labs    04/14/23 1900  VITAMINB12 533  FOLATE 7.1  FERRITIN 1,603*  TIBC NOT CALCULATED  IRON 21*   Sepsis Labs: Recent Labs  Lab 04/13/23 0318 04/13/23 1952 04/13/23 2223 04/14/23 0235  PROCALCITON 0.53  --   --   --   LATICACIDVEN 1.5 1.2 2.0* 0.9    Recent Results (from the past 240 hours)  Resp panel by RT-PCR (RSV, Flu A&B, Covid) Anterior Nasal Swab     Status: Abnormal   Collection Time: 04/12/23  9:58 AM   Specimen: Anterior Nasal Swab  Result Value Ref Range Status   SARS Coronavirus 2 by RT PCR POSITIVE (A) NEGATIVE Final   Influenza A  by PCR NEGATIVE NEGATIVE Final   Influenza B by PCR NEGATIVE NEGATIVE Final    Comment: (NOTE) The Xpert Xpress SARS-CoV-2/FLU/RSV plus assay is intended as an aid in the diagnosis of influenza from Nasopharyngeal swab specimens and should not be used as a sole basis for treatment. Nasal washings and aspirates are unacceptable for Xpert Xpress SARS-CoV-2/FLU/RSV testing.  Fact Sheet for Patients: BloggerCourse.com  Fact Sheet for Healthcare Providers: SeriousBroker.it  This test is not yet approved or cleared by the [place] FDA and has been authorized for detection and/or diagnosis of SARS-CoV-2 by FDA under an Emergency Use Authorization (EUA). This EUA will remain in effect (meaning this test can be used) for the duration of the COVID-19 declaration under Section 564(b)(1) of the Act, 21 U.S.C. section 360bbb-3(b)(1), unless the authorization is terminated or revoked.     Resp Syncytial Virus by PCR NEGATIVE NEGATIVE Final    Comment: (NOTE) Fact Sheet for Patients: BloggerCourse.com  Fact Sheet for Healthcare Providers: SeriousBroker.it  This test is not yet approved or cleared by the [place] FDA and has been authorized  for detection and/or diagnosis of SARS-CoV-2 by FDA under an Emergency Use Authorization (EUA). This EUA will remain in effect (meaning this test can be used) for the duration of the COVID-19 declaration under Section 564(b)(1) of the Act, 21 U.S.C. section 360bbb-3(b)(1), unless the authorization is terminated or revoked.  Performed at [facility], [address], [place], [place] [address]   Blood Culture (routine x 2)     Status: None (Preliminary result)   Collection Time: 04/12/23 10:40 AM   Specimen: BLOOD  Result Value Ref Range Status   Specimen Description BLOOD LEFT ANTECUBITAL  Final   Special Requests   Final    BOTTLES DRAWN AEROBIC AND ANAEROBIC Blood Culture results may not be optimal due to an inadequate volume of blood received in culture bottles   Culture   Final    NO GROWTH 3 DAYS Performed at [facility], [address] [place], [place] [address]    Report Status PENDING  Incomplete  Blood Culture (routine x 2)     Status: None (Preliminary result)   Collection Time: 04/12/23 10:40 AM   Specimen: BLOOD  Result Value Ref Range Status   Specimen Description BLOOD RIGHT ANTECUBITAL  Final   Special Requests   Final    BOTTLES DRAWN AEROBIC AND ANAEROBIC Blood Culture results may not be optimal due to an inadequate volume of blood received in culture bottles   Culture   Final    NO GROWTH 3 DAYS Performed at [facility], [address], [place], [place] [address]    Report Status PENDING  Incomplete  MRSA Next Gen by PCR, Nasal     Status: None   Collection Time: 04/13/23  1:53 AM   Specimen: Nasal Mucosa; Nasal Swab  Result Value Ref Range Status   MRSA by PCR Next Gen NOT DETECTED NOT DETECTED Final    Comment: (NOTE) The GeneXpert MRSA Assay (FDA approved for NASAL specimens only), is one component of a comprehensive MRSA colonization surveillance program. It is not intended to diagnose MRSA infection nor to guide or monitor  treatment for MRSA infections. Test performance is not FDA approved in patients less than 70 years old. Performed at [facility], [address], [place], [place] [address]          Radiology Studies: ECHOCARDIOGRAM LIMITED Result Date: 04/14/2023  ECHOCARDIOGRAM LIMITED REPORT   Patient Name:   KAVION MANCINAS Date of Exam: 04/14/2023 Medical Rec #:  [id number]      Height:       68.0 in Accession #:    [id number]     Weight:       140.9 lb Date of Birth:  02-18-1940      BSA:          1.761 m Patient Age:    82 years       BP:           113/77 mmHg Patient Gender: M              HR:           112 bpm. Exam Location:  Inpatient Procedure: Limited Echo Indications:    Shock  History:        Patient has prior history of Echocardiogram examinations, most                 recent 12/07/2022. Cardiomyopathy, CAD and Previous Myocardial                 Infarction; COPD.  Sonographer:    Darlys Gales Referring Phys: [id number] Vinnie Level SMITH IMPRESSIONS  1. Limited echo for LVEF  2. Left ventricular ejection fraction, by estimation, is 25 to 30%. Left ventricular ejection fraction by PLAX is 30 %. The left ventricle has severely decreased function. The left ventricle demonstrates global hypokinesis.  3. Left atrial size was moderately dilated. Comparison(s): Changes from prior study are noted. 12/07/2022: LVEF 25%. FINDINGS  Left Ventricle: Left ventricular ejection fraction, by estimation, is 25 to 30%. Left ventricular ejection fraction by PLAX is 30 %. The left ventricle has severely decreased function. The left ventricle demonstrates global hypokinesis. There is no left  ventricular hypertrophy. Left Atrium: Left atrial size was moderately dilated. LEFT VENTRICLE PLAX 2D LV EF:         Left ventricular ejection fraction by PLAX is 30 %. LVIDd:         5.00 cm LVIDs:         4.30 cm LV PW:         0.70 cm LV IVS:        0.90 cm  LEFT ATRIUM             Index LA Vol (A2C):   43.6 ml 24.76 ml/m LA Vol (A4C):    64.7 ml 36.74 ml/m LA Biplane Vol: 54.8 ml 31.12 ml/m Zoila Shutter MD Electronically signed by Zoila Shutter MD Signature Date/Time: 04/14/2023/5:07:17 PM    Final    DG Chest Port 1 View Result Date: 04/14/2023 CLINICAL DATA:  Status post PICC placement. EXAM: PORTABLE CHEST 1 VIEW COMPARISON:  April 13, 2023. FINDINGS: Stable cardiomegaly. Status post coronary bypass graft. Interval placement of left-sided PICC line with distal tip in expected position of cavoatrial junction. Right lung is clear. Mild central pulmonary vascular congestion is noted. Left basilar atelectasis is noted with possible small pleural effusion. IMPRESSION: Interval placement of left-sided PICC line with distal tip in expected position of cavoatrial junction. Mild central pulmonary vascular congestion. Stable left basilar atelectasis with small left pleural effusion. Electronically Signed   By: Lupita Raider M.D.   On: 04/14/2023 13:59   [place] EKG SITE RITE Result Date: 04/14/2023 If Site Rite image not attached, placement could not be confirmed due to current cardiac rhythm.  DG Chest Port 1 View Result  Date: 04/13/2023 CLINICAL DATA:  CHF EXAM: PORTABLE CHEST 1 VIEW COMPARISON:  X-ray 04/12/2023 FINDINGS: Hyperinflation. Large cardiopericardial silhouette. Sternal wires. Vascular congestion. Persistent lung base opacities with a left-sided effusion. Opacities more left than right and slightly increased bilaterally. No pneumothorax. Overlapping cardiac leads. Film is rotated. IMPRESSION: Postop chest. Increasing lung base opacity. Component of effusion on the left. Recommend follow-up Electronically Signed   By: Karen Kays M.D.   On: 04/13/2023 18:21        Scheduled Meds:  sodium chloride   Intravenous Once   allopurinol  300 mg Oral Daily   amiodarone  200 mg Oral q AM   ascorbic acid  250 mg Oral BID   atorvastatin  40 mg Oral Daily   Chlorhexidine Gluconate Cloth  6 each Topical Daily   doxycycline  100 mg Oral  Q12H   feeding supplement  237 mL Oral TID BM   fludrocortisone  0.2 mg Oral BID   insulin aspart  0-9 Units Subcutaneous TID WC   midodrine  15 mg Oral TID WC   mirtazapine  30 mg Oral QHS   multivitamin with minerals  1 tablet Oral Daily   pantoprazole  40 mg Oral Daily   sodium chloride flush  10-40 mL Intracatheter Q12H   sodium chloride flush  3 mL Intravenous Q12H   thiamine  100 mg Oral Daily   zinc sulfate (50mg  elemental zinc)  220 mg Oral Daily   Continuous Infusions:  dextrose 5% lactated ringers Stopped (04/15/23 1331)     LOS: 2 days    Time spent: 52 minutes spent on 04/15/2023 caring for this patient face-to-face including chart review, ordering labs/tests, documenting, discussion with nursing staff, consultants, updating family and interview/physical exam    Alvira Philips [place], DO [facility] Available via Epic secure chat 7am-7pm After these hours, please refer to coverage provider listed on [email or web address] 04/15/2023, 2:13 PM

## 2023-04-15 NOTE — Plan of Care (Signed)
  Problem: Education: Goal: Knowledge of risk factors and measures for prevention of condition will improve Outcome: Progressing   Problem: Coping: Goal: Psychosocial and spiritual needs will be supported Outcome: Progressing   Problem: Respiratory: Goal: Complications related to the disease process, condition or treatment will be avoided or minimized Outcome: Progressing   Problem: Fluid Volume: Goal: Hemodynamic stability will improve Outcome: Progressing

## 2023-04-15 NOTE — Care Management Important Message (Signed)
 Important Message  Patient Details  Name: Donald Rios MRN: [id number] Date of Birth: January 18, 1940   Important Message Given:        [name] 04/15/2023, 10:03 AM

## 2023-04-15 NOTE — Patient Instructions (Signed)
 Visit Information  Thank you for taking time to visit with me today. Please don't hesitate to contact me if I can be of assistance to you.   Following are the goals we discussed today:   Goals Addressed             This Visit's Progress    patient will be able to manage CHF, atrial fibrillation better at home- [facility] nurse care coordination services       Interventions Today    Flowsheet Row Most Recent Value  Chronic Disease   Chronic disease during today's visit Diabetes, Congestive Heart Failure (CHF), Other  [shoes for swollen feet, back of neck pain]  General Interventions   General Interventions Discussed/Reviewed General Interventions Reviewed, Doctor Visits, Durable Medical Equipment (DME)  Doctor Visits Discussed/Reviewed Doctor Visits Reviewed, PCP, Specialist  Durable Medical Equipment (DME) Glucomoter, BP Cuff  PCP/Specialist Visits Compliance with follow-up visit  Exercise Interventions   Exercise Discussed/Reviewed Exercise Reviewed, Physical Activity, Weight Managment, Assistive device use and maintanence  Physical Activity Discussed/Reviewed Physical Activity Reviewed, Types of exercise  Weight Management Weight maintenance  [ecouraged weight monitoring as part of CHF actin plan]  Education Interventions   Education Provided Provided Education  [risks related to non compliance with heart failure action plan]  Provided Verbal Education On Sick Day Rules, When to see the doctor, Exercise, Nutrition  Mental Health Interventions   Mental Health Discussed/Reviewed Mental Health Reviewed, Coping Strategies  Nutrition Interventions   Nutrition Discussed/Reviewed Nutrition Reviewed, Decreasing salt, Fluid intake  Pharmacy Interventions   Pharmacy Dicussed/Reviewed Pharmacy Topics Reviewed, Affording Medications  Advanced Directive Interventions   Advanced Directives Discussed/Reviewed Advanced Directives Discussed  [encouraged]              Our next appointment is  by telephone on as needed at as needed  Please call the care guide team at [phone] if you need to cancel or reschedule your appointment.   If you are experiencing a Mental Health or Behavioral Health Crisis or need someone to talk to, please call the Suicide and Crisis Lifeline: 988 call the [place] National Suicide Prevention Lifeline: (737) 887-6273 or TTY: 680-293-9461 TTY (252)044-5173) to talk to a trained counselor call 1-800-273-TALK (toll free, 24 hour hotline) call the [facility]: 438-095-2889 call 911   Patient verbalizes understanding of instructions and care plan provided today and agrees to view in MyChart. Active MyChart status and patient understanding of how to access instructions and care plan via MyChart confirmed with patient.     The patient has been provided with contact information for the care management team and has been advised to call with any health related questions or concerns.   Antonious Omahoney L. Noelle Penner, RN, BSN, CCM [facility] Health RN Care Manager [phone]

## 2023-04-15 NOTE — Progress Notes (Signed)
 Pharmacist Heart Failure Core Measure Documentation  Assessment: Donald Rios has an EF documented as 25-30% on 04/14/23 by ECHO.  Rationale: Heart failure patients with left ventricular systolic dysfunction (LVSD) and an EF < 40% should be prescribed an angiotensin converting enzyme inhibitor (ACEI) or angiotensin receptor blocker (ARB) at discharge unless a contraindication is documented in the medical record.  This patient is not currently on an ACEI or ARB for HF.  This note is being placed in the record in order to provide documentation that a contraindication to the use of these agents is present for this encounter.  ACE Inhibitor or Angiotensin Receptor Blocker is contraindicated (specify all that apply)  []   ACEI allergy AND ARB allergy []   Angioedema []   Moderate or severe aortic stenosis []   Hyperkalemia [x]   Hypotension []   Renal artery stenosis []   Worsening renal function, preexisting renal disease or dysfunction

## 2023-04-15 NOTE — TOC CM/SW Note (Signed)
 Transition of Care [facility]) - Inpatient Brief Assessment Donn Pierini RN, BSN Transitions of Care Unit 4E- RN Case Manager See Treatment Team for direct phone #   Patient Details  Name: ZAEEM KANDEL MRN: [id number] Date of Birth: [birth year]  Transition of Care [facility]) CM/SW Contact:    Darrold Span, RN Phone Number: 04/15/2023, 1:53 PM   Clinical Narrative: Admitted from home w/ wife, sepsis, cellulitis. +COVID on admit (4/1). PT/OT evals ordered, will need to f/u for possible SNF.    Transition of Care Asessment: Insurance and Status: Insurance coverage has been reviewed Patient has primary care physician: Yes Home environment has been reviewed: home w/ wife Prior level of function:: independent Prior/Current Home Services: No current home services Social Drivers of Health Review: SDOH reviewed no interventions necessary Readmission risk has been reviewed: Yes Transition of care needs: transition of care needs identified, TOC will continue to follow

## 2023-04-15 NOTE — Progress Notes (Signed)
 Critical hgb called into [facility], Iris to relay info to MD. HGB 6.4

## 2023-04-16 DIAGNOSIS — K5901 Slow transit constipation: Secondary | ICD-10-CM

## 2023-04-16 DIAGNOSIS — D649 Anemia, unspecified: Secondary | ICD-10-CM

## 2023-04-16 LAB — TYPE AND SCREEN
ABO/RH(D): A POS
Antibody Screen: NEGATIVE
Unit division: 0

## 2023-04-16 LAB — GLUCOSE, CAPILLARY
Glucose-Capillary: 107 mg/dL — ABNORMAL HIGH (ref 70–99)
Glucose-Capillary: 168 mg/dL — ABNORMAL HIGH (ref 70–99)
Glucose-Capillary: 186 mg/dL — ABNORMAL HIGH (ref 70–99)
Glucose-Capillary: 132 mg/dL — ABNORMAL HIGH (ref 70–99)

## 2023-04-16 LAB — C-REACTIVE PROTEIN: CRP: 4.9 mg/dL — ABNORMAL HIGH (ref <1.0)

## 2023-04-16 LAB — CBC
HCT: 24.4 % — ABNORMAL LOW (ref 39.0–52.0)
Hemoglobin: 7.9 g/dL — ABNORMAL LOW (ref 13.0–17.0)
MCH: 31.5 pg (ref 26.0–34.0)
MCHC: 32.4 g/dL (ref 30.0–36.0)
MCV: 97.2 fL (ref 80.0–100.0)
Platelets: 190 10*3/uL (ref 150–400)
RBC: 2.51 MIL/uL — ABNORMAL LOW (ref 4.22–5.81)
RDW: 20.3 % — ABNORMAL HIGH (ref 11.5–15.5)
WBC: 8.8 10*3/uL (ref 4.0–10.5)
nRBC: 0.3 % — ABNORMAL HIGH (ref 0.0–0.2)

## 2023-04-16 LAB — BPAM RBC
Blood Product Expiration Date: 202504222359
ISSUE DATE / TIME: 202504041309
Unit Type and Rh: 6200

## 2023-04-16 LAB — BASIC METABOLIC PANEL WITH GFR
Anion gap: 8 (ref 5–15)
BUN: 19 mg/dL (ref 8–23)
CO2: 22 mmol/L (ref 22–32)
Calcium: 8.8 mg/dL — ABNORMAL LOW (ref 8.9–10.3)
Chloride: 109 mmol/L (ref 98–111)
Creatinine, Ser: 1.14 mg/dL (ref 0.61–1.24)
GFR, Estimated: >60 mL/min (ref >60)
Glucose, Bld: 125 mg/dL — ABNORMAL HIGH (ref 70–99)
Potassium: 3.8 mmol/L (ref 3.5–5.1)
Sodium: 139 mmol/L (ref 135–145)

## 2023-04-16 LAB — PHOSPHORUS: Phosphorus: 1.9 mg/dL — ABNORMAL LOW (ref 2.5–4.6)

## 2023-04-16 LAB — MAGNESIUM: Magnesium: 2.2 mg/dL (ref 1.7–2.4)

## 2023-04-16 LAB — SEDIMENTATION RATE: Sed Rate: 24 mm/h — ABNORMAL HIGH (ref 0–16)

## 2023-04-16 LAB — CORTISOL-AM, BLOOD: Cortisol - AM: 11.3 ug/dL (ref 6.7–22.6)

## 2023-04-16 MED ORDER — SENNOSIDES-DOCUSATE SODIUM 8.6-50 MG PO TABS
1.0000 | ORAL_TABLET | Freq: Two times a day (BID) | ORAL | Status: DC
  Administered 2023-04-16 – 2023-04-17 (×3): 1 via ORAL
  Filled 2023-04-16 (×5): qty 1

## 2023-04-16 MED ORDER — POLYETHYLENE GLYCOL 3350 17 G PO PACK
17.0000 g | PACK | Freq: Two times a day (BID) | ORAL | Status: DC
  Administered 2023-04-16 – 2023-04-17 (×3): 17 g via ORAL
  Filled 2023-04-16 (×4): qty 1

## 2023-04-16 MED ORDER — POTASSIUM PHOSPHATES 15 MMOLE/5ML IV SOLN
30.0000 mmol | Freq: Once | INTRAVENOUS | Status: AC
  Administered 2023-04-16: 30 mmol via INTRAVENOUS
  Filled 2023-04-16: qty 10

## 2023-04-16 MED ORDER — BISACODYL 10 MG RE SUPP
10.0000 mg | Freq: Every day | RECTAL | Status: DC | PRN
  Administered 2023-04-16: 10 mg via RECTAL
  Filled 2023-04-16: qty 1

## 2023-04-16 NOTE — Progress Notes (Signed)
 PROGRESS NOTE    Donald Rios  WUJ:[id number] DOB: 14-Feb-1940 DOA: 04/12/2023 PCP: [name], MD    Brief Narrative:   Donald Rios is a 83 y.o. 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  On EMS arrival, patient was altered and noted to have a CBG of 49; D10 was administered with improvement of patient's glucose and mentation.  Patient reported some nausea and decreased oral intake.  Recently reported amputation of left fifth toe a few weeks prior and has been doing well since.  States he thinks he is on some antibiotics still.  Does have ongoing erythema/wound at left heel, also with complaints of cough over the last week.  Denies fever, no chills, no chest pain, no shortness of breath, no abdominal pain, no constipation.  In the ED, temperature 97.5 F, BP 80s-110 systolic, heart rate 100s-130s, RR teens-20s, on room air.  Sodium 134, CO2 19, BUN 34, creatinine 2.65, glucose 199, calcium 8.8, protein 6.1, AST 66, alkaline phosphatase 139.  WBC 17.2, hemoglobin 9.9, INR 1.7.  Lactic acid 4.6> 2.7.  COVID-19 positive.  Chest x-ray without acute abnormality.  Left foot x-ray without acute abnormality.  Blood cultures and urinalysis ordered.  Patient received vancomycin, cefepime, Flagyl in the ED and 2 L IV fluid bolus.  TRH consulted for admission for concerns of sepsis secondary to left lower extremity cellulitis with recent left fifth toe amputation.  Significant hospital events: 4/1: admit to [facility] broad abx c/f cellulitis L foot  4/2: PCCM consult for low NIBP readings, txf to ICU  4/3: BP still low normal. getting a PICC  4/4: transfer back to  [facility]  Assessment & Plan:   Sepsis, POA Left lower extremity cellulitis Patient presenting with redness to his left foot concerning for cellulitis in the setting of recent amputation of his fifth left digit.  MR left heel without contrast with no evidence of osteomyelitis, diffuse nonspecific edema intrinsic musculature of the foot reflective of infectious myositis or myopathy, no intramuscular collection identified, soft tissue irregularity posterior heel with nonspecific subcu edema consistent with cellulitis without fluid collection, mild tenosynovitis posterior tibialis tendon, chronic plantar fasciitis, distal Achilles tendinosis.  Initially started on empiric antibiotics with vancomycin, cefepime, metronidazole and now de-escalated to doxycycline at time of transfer from intensive care unit/PCCM. IV Cefepime/Flagyl 4/1>4/3, PO Doxy 4/3> Leukocytosis resolved.  Blood cultures x 2 negative to date.  Covid-19 viral infection without pneumonia Chest x-ray with no acute cardiopulmonary disease process. -- Continue airborne/contact isolation precautions  Hypotension Hx Chronic systolic congestive heart failure, compensated Hx Essential hypertension Hx Paroxysmal atrial fibrillation At baseline on metoprolol tartrate 75 mg p.o. twice daily, furosemide 40 mg p.o. twice daily, spironolactone 25 mg p.o. twice daily, amiodarone 200 mg p.o. daily. Patient with hypotension during hospitalization requiring transfer to the ICU, now transferred back to floor.  Likely secondary to sepsis as above versus adrenal insufficiency given previous history of longstanding use of steroids which were tapered off by rheumatology with history of temporal arteritis.  Cortisol level within normal limits. -- Holding home metoprolol, furosemide, spironolactone -- s/p albumin 25 g IV every 6 hours x 4 doses -- Started on fludrocortisone 0.2 mg  p.o. twice daily by PCCM -- Midodrine 15 mg p.o. 3 times daily -- Continue  amiodarone 200 mg p.o. daily -- Hold Eliquis for anemia; may resume in the next 1-2 days if hemoglobin remains stable (no signs of GI blood loss) Blood pressures soft to normal.  Mostly soft.  Hemoglobin up from 6.4-7.9.  Follow CBC in a.m. and if hemoglobin remains stable then consider resuming Eliquis.  No overt bleeding reported.  CAD s/p CABG -- Crestor 40 g p.o. daily  Acute metabolic encephalopathy/hypoglycemia: resolved DM2 Hemoglobin A1c 6.2 on 02/08/2023, well-controlled.  At baseline on Jardiance, glipizide 5 mg p.o. daily, and insulin aspart sliding scale.  On EMS arrival, patient was noted to be hypoglycemic and received D10. Stable inpatient control.  COPD Stable without clinical bronchospasm.  On room air without hypoxia.  GERD -- Protonix 40 mg p.o. daily  Vitamin D deficiency Vitamin D 25-hydroxy level low at 20.65. -- started on Cholecalciferol 1000 units PO daily  Acute on chronic anemia Anemia panel with iron 21, ferritin 1603, folate 7.1, vitamin B12 533 -- hgb 9.9>10.9>7.5>6.4 -- Following a unit of PRBC, hemoglobin up to 7.9. -- Holding Eliquis as above Follow CBC in AM.  Gout -- Allopurinol 300 mg p.o. daily  Hx temporal arteritis Follows with rheumatology outpatient, Dr. Dimple Casey.  Currently on Actemra every 4 weeks.  Titrated off of prednisone.  Left foot wound, POA PVD/PAD s/p left fifth toe amputation Recent left fifth digit amputation by vascular surgery on 03/22/2023.  Patient with noted to have full-thickness wound to left dorsal foot, fifth digit, left medial foot.  Seen by wound RN. -- Cleanse L heel, L dorsal foot, L 5th digit, L medial foot and L great toe with soap and water, dry and apply Xeroform gauze to wound beds daily. Cover with dry gauze and Kerlix roll gauze. Place L foot in Prevalon boot to offload pressure Will attempt to remove his multiple dressings of bilateral lower extremity and examine wounds in the next 1 to 2 days.  Discussed with  RN to see if he is able to take pictures to go on patient's chart.  Constipation Not on bowel regimen.  Initiated MiraLAX 17 g twice daily, Senokot-S 1 tab twice daily and as needed Dulcolax suppository Monitor closely for response and adjust regimen as needed.  Weakness/debility/deconditioning Recent fall T12 fracture -- Continue PT/OT; recommending SNF placement -- TOC consult  DVT prophylaxis: Holding anticoagulation due to drop in hemoglobin-see discussion above    Code Status: Full Code Family Communication: No family present at bedside this morning  Disposition Plan:  Level of care: Progressive Status is: Inpatient Remains inpatient appropriate because: Monitoring for anemia stability, need to resume Eliquis and SNF placement pending.    Consultants:  PCCM: Signed off 4/4  Procedures:  None  Antimicrobials:  Vancomycin 4/1-/1 Metronidazole 4/1-/3 Cefepime 4/1-4/3 Doxycycline 4/3>>   Subjective: Patient reports that he is having small hard stools and pain on defecation.  Has not really had a good BM since hospital admission.  Tolerating diet.  No nausea, vomiting or abdominal pain.  Reports poor appetite.  Objective: Vitals:   04/16/23 0242 04/16/23 0400 04/16/23 0830 04/16/23 1100  BP: (!) 105/56 119/64 (!) 104/53 96/60  Pulse: 99  91 (!) 107  Resp: 20 20 16  (!) 21  Temp:  99.6 F (37.6 C) 98.4 F (36.9 C) 98.9 F (37.2 C)  TempSrc:  Oral Oral Oral  SpO2: 93% 99% 99% 96%  Weight:  Height:        Intake/Output Summary (Last 24 hours) at 04/16/2023 1558 Last data filed at 04/16/2023 0600 Gross per 24 hour  Intake 330 ml  Output 1250 ml  Net -920 ml   Filed Weights   04/12/23 0949 04/13/23 0332 04/15/23 0411  Weight: 67 kg 63.9 kg 66.2 kg    Examination:  General exam: Elderly male, moderately built and frail, chronically ill looking lying comfortably in bed Respiratory system: Clear to auscultation. Respiratory effort normal. Cardiovascular  system: S1 & S2 heard, RRR. No JVD, murmurs, rubs, gallops or clicks. No pedal edema.  Telemetry personally reviewed: A-fib with controlled ventricular rate/BBB morphology Gastrointestinal system: Abdomen is nondistended, soft and nontender. No organomegaly or masses felt. Normal bowel sounds heard. Central nervous system: Alert and oriented. No focal neurological deficits. Extremities: Symmetric 5 x 5 power.  Bilateral lower extremity with multiple dressings which are clean and dry. Skin: No rashes, lesions or ulcers Psychiatry: Judgement and insight appear normal. Mood & affect appropriate.               Data Reviewed: I have personally reviewed following labs and imaging studies  CBC: Recent Labs  Lab 04/12/23 1040 04/12/23 1056 04/13/23 0808 04/15/23 0400 04/16/23 0419  WBC 17.2*  --  8.8 7.3 8.8  NEUTROABS 16.2*  --   --   --   --   HGB 9.9* 10.9* 7.5* 6.4* 7.9*  HCT 32.2* 32.0* 24.0* 20.3* 24.4*  MCV 104.2*  --  101.7* 100.5* 97.2  PLT 228  --  167 171 190   Basic Metabolic Panel: Recent Labs  Lab 04/12/23 1038 04/12/23 1056 04/13/23 0319 04/14/23 1410 04/15/23 0400 04/16/23 0419  NA 134* 132* 134* 134* 136 139  K 3.6 3.5 3.0* 4.0 3.6 3.8  CL 98 99 104 105 107 109  CO2 19*  --  19* 21* 22 22  GLUCOSE 199* 195* 78 100* 143* 125*  BUN 34* 31* 29* 23 22 19   CREATININE 2.65* 2.60* 1.91* 1.42* 1.17 1.14  CALCIUM 8.8*  --  8.0* 8.5* 8.5* 8.8*  MG  --   --   --   --  1.9 2.2  PHOS  --   --   --   --  1.9* 1.9*   GFR: Estimated Creatinine Clearance: 47.6 mL/min (by C-G formula based on SCr of 1.14 mg/dL). Liver Function Tests: Recent Labs  Lab 04/12/23 1038 04/13/23 0319  AST 67* 52*  ALT 34 27  ALKPHOS 139* 99  BILITOT 0.9 0.9  PROT 6.1* 5.1*  ALBUMIN 2.3* 2.3*    Coagulation Profile: Recent Labs  Lab 04/12/23 1040 04/13/23 0319  INR 1.7* 1.6*    CBG: Recent Labs  Lab 04/15/23 1108 04/15/23 1605 04/15/23 2130 04/16/23 0831  04/16/23 1149  GLUCAP 177* 135* 109* 107* 168*    Anemia Panel: Recent Labs    04/14/23 1900  VITAMINB12 533  FOLATE 7.1  FERRITIN 1,603*  TIBC NOT CALCULATED  IRON 21*    Recent Results (from the past 240 hours)  Resp panel by RT-PCR (RSV, Flu A&B, Covid) Anterior Nasal Swab     Status: Abnormal   Collection Time: 04/12/23  9:58 AM   Specimen: Anterior Nasal Swab  Result Value Ref Range Status   SARS Coronavirus 2 by RT PCR POSITIVE (A) NEGATIVE Final   Influenza A by PCR NEGATIVE NEGATIVE Final   Influenza B by PCR NEGATIVE NEGATIVE Final    Comment: (NOTE) The  Xpert Xpress SARS-CoV-2/FLU/RSV plus assay is intended as an aid in the diagnosis of influenza from Nasopharyngeal swab specimens and should not be used as a sole basis for treatment. Nasal washings and aspirates are unacceptable for Xpert Xpress SARS-CoV-2/FLU/RSV testing.  Fact Sheet for Patients: BloggerCourse.com  Fact Sheet for Healthcare Providers: SeriousBroker.it  This test is not yet approved or cleared by the [place] FDA and has been authorized for detection and/or diagnosis of SARS-CoV-2 by FDA under an Emergency Use Authorization (EUA). This EUA will remain in effect (meaning this test can be used) for the duration of the COVID-19 declaration under Section 564(b)(1) of the Act, 21 U.S.C. section 360bbb-3(b)(1), unless the authorization is terminated or revoked.     Resp Syncytial Virus by PCR NEGATIVE NEGATIVE Final    Comment: (NOTE) Fact Sheet for Patients: BloggerCourse.com  Fact Sheet for Healthcare Providers: SeriousBroker.it  This test is not yet approved or cleared by the [place] FDA and has been authorized for detection and/or diagnosis of SARS-CoV-2 by FDA under an Emergency Use Authorization (EUA). This EUA will remain in effect (meaning this test can be used) for the  duration of the COVID-19 declaration under Section 564(b)(1) of the Act, 21 U.S.C. section 360bbb-3(b)(1), unless the authorization is terminated or revoked.  Performed at [facility], [address] [place], [place] [address]   Blood Culture (routine x 2)     Status: None (Preliminary result)   Collection Time: 04/12/23 10:40 AM   Specimen: BLOOD  Result Value Ref Range Status   Specimen Description BLOOD LEFT ANTECUBITAL  Final   Special Requests   Final    BOTTLES DRAWN AEROBIC AND ANAEROBIC Blood Culture results may not be optimal due to an inadequate volume of blood received in culture bottles   Culture   Final    NO GROWTH 4 DAYS Performed at [facility], [address] [place], [place] [address]    Report Status PENDING  Incomplete  Blood Culture (routine x 2)     Status: None (Preliminary result)   Collection Time: 04/12/23 10:40 AM   Specimen: BLOOD  Result Value Ref Range Status   Specimen Description BLOOD RIGHT ANTECUBITAL  Final   Special Requests   Final    BOTTLES DRAWN AEROBIC AND ANAEROBIC Blood Culture results may not be optimal due to an inadequate volume of blood received in culture bottles   Culture   Final    NO GROWTH 4 DAYS Performed at [facility], [address], [place], [place] [address]    Report Status PENDING  Incomplete  MRSA Next Gen by PCR, Nasal     Status: None   Collection Time: 04/13/23  1:53 AM   Specimen: Nasal Mucosa; Nasal Swab  Result Value Ref Range Status   MRSA by PCR Next Gen NOT DETECTED NOT DETECTED Final    Comment: (NOTE) The GeneXpert MRSA Assay (FDA approved for NASAL specimens only), is one component of a comprehensive MRSA colonization surveillance program. It is not intended to diagnose MRSA infection nor to guide or monitor treatment for MRSA infections. Test performance is not FDA approved in patients less than 65 years old. Performed at [facility], [address] [place],  [place] [address]          Radiology Studies: No results found.       Scheduled Meds:  sodium chloride   Intravenous Once   allopurinol  300 mg Oral  Daily   amiodarone  200 mg Oral q AM   ascorbic acid  250 mg Oral BID   atorvastatin  40 mg Oral Daily   Chlorhexidine Gluconate Cloth  6 each Topical Daily   cholecalciferol  1,000 Units Oral Daily   doxycycline  100 mg Oral Q12H   feeding supplement  237 mL Oral TID BM   fludrocortisone  0.2 mg Oral BID   insulin aspart  0-9 Units Subcutaneous TID WC   midodrine  15 mg Oral TID WC   mirtazapine  30 mg Oral QHS   multivitamin with minerals  1 tablet Oral Daily   pantoprazole  40 mg Oral Daily   polyethylene glycol  17 g Oral BID   senna-docusate  1 tablet Oral BID   sodium chloride flush  10-40 mL Intracatheter Q12H   sodium chloride flush  3 mL Intravenous Q12H   thiamine  100 mg Oral Daily   zinc sulfate (50mg  elemental zinc)  220 mg Oral Daily   Continuous Infusions:  potassium PHOSPHATE IVPB (in mmol) 30 mmol (04/16/23 1217)     LOS: 3 days   Marcellus Scott, MD,  FACP, [facility], [facility], [facility]   [facility] [place]     To contact the attending provider between 7A-7P or the covering provider during after hours 7P-7A, please log into the web site [email or web address] and access using universal [place] password for that web site. If you do not have the password, please call the hospital operator.

## 2023-04-16 NOTE — Plan of Care (Signed)
  Problem: Education: Goal: Knowledge of risk factors and measures for prevention of condition will improve Outcome: Progressing   Problem: Coping: Goal: Psychosocial and spiritual needs will be supported Outcome: Progressing   Problem: Respiratory: Goal: Will maintain a patent airway Outcome: Progressing Goal: Complications related to the disease process, condition or treatment will be avoided or minimized Outcome: Progressing   Problem: Fluid Volume: Goal: Hemodynamic stability will improve Outcome: Progressing   Problem: Clinical Measurements: Goal: Diagnostic test results will improve Outcome: Progressing Goal: Signs and symptoms of infection will decrease Outcome: Progressing   Problem: Respiratory: Goal: Ability to maintain adequate ventilation will improve Outcome: Progressing   Problem: Coping: Goal: Ability to adjust to condition or change in health will improve Outcome: Progressing   Problem: Fluid Volume: Goal: Ability to maintain a balanced intake and output will improve Outcome: Progressing   Problem: Health Behavior/Discharge Planning: Goal: Ability to identify and utilize available resources and services will improve Outcome: Progressing Goal: Ability to manage health-related needs will improve Outcome: Progressing   Problem: Metabolic: Goal: Ability to maintain appropriate glucose levels will improve Outcome: Progressing   Problem: Nutritional: Goal: Maintenance of adequate nutrition will improve Outcome: Progressing Goal: Progress toward achieving an optimal weight will improve Outcome: Progressing   Problem: Skin Integrity: Goal: Risk for impaired skin integrity will decrease Outcome: Progressing   Problem: Tissue Perfusion: Goal: Adequacy of tissue perfusion will improve Outcome: Progressing   Problem: Education: Goal: Ability to describe self-care measures that may prevent or decrease complications (Diabetes Survival Skills Education)  will improve Outcome: Progressing Goal: Individualized Educational Video(s) Outcome: Progressing   Problem: Coping: Goal: Ability to adjust to condition or change in health will improve Outcome: Progressing   Problem: Fluid Volume: Goal: Ability to maintain a balanced intake and output will improve Outcome: Progressing   Problem: Health Behavior/Discharge Planning: Goal: Ability to identify and utilize available resources and services will improve Outcome: Progressing Goal: Ability to manage health-related needs will improve Outcome: Progressing   Problem: Metabolic: Goal: Ability to maintain appropriate glucose levels will improve Outcome: Progressing   Problem: Nutritional: Goal: Maintenance of adequate nutrition will improve Outcome: Progressing Goal: Progress toward achieving an optimal weight will improve Outcome: Progressing   Problem: Skin Integrity: Goal: Risk for impaired skin integrity will decrease Outcome: Progressing   Problem: Tissue Perfusion: Goal: Adequacy of tissue perfusion will improve Outcome: Progressing   Problem: Education: Goal: Knowledge of General Education information will improve Description: Including pain rating scale, medication(s)/side effects and non-pharmacologic comfort measures Outcome: Progressing   Problem: Health Behavior/Discharge Planning: Goal: Ability to manage health-related needs will improve Outcome: Progressing   Problem: Clinical Measurements: Goal: Ability to maintain clinical measurements within normal limits will improve Outcome: Progressing Goal: Will remain free from infection Outcome: Progressing Goal: Diagnostic test results will improve Outcome: Progressing Goal: Respiratory complications will improve Outcome: Progressing Goal: Cardiovascular complication will be avoided Outcome: Progressing   Problem: Activity: Goal: Risk for activity intolerance will decrease Outcome: Progressing   Problem:  Nutrition: Goal: Adequate nutrition will be maintained Outcome: Progressing   Problem: Coping: Goal: Level of anxiety will decrease Outcome: Progressing   Problem: Elimination: Goal: Will not experience complications related to bowel motility Outcome: Progressing Goal: Will not experience complications related to urinary retention Outcome: Progressing   Problem: Pain Managment: Goal: General experience of comfort will improve and/or be controlled Outcome: Progressing   Problem: Safety: Goal: Ability to remain free from injury will improve Outcome: Progressing

## 2023-04-16 NOTE — TOC Initial Note (Signed)
 Transition of Care [facility]) - Initial/Assessment Note    Patient Details  Name: Donald Rios MRN: [id number] Date of Birth: 11-Oct-1940  Transition of Care [facility]) CM/SW Contact:    Nicanor Bake Phone Number: 04/16/2023, 11:43 AM  Clinical Narrative: 9:39 AM- CSW called the patients cell phone to address SNF consult. Patient did not answer. CSW will continue to make attempts to reach the patient.      9:59 AM- CSW received a phone call back but the patient was having difficulty hearing CSW over the phone. CSW will continue to make attempts to make contact with the patient.   11:41 AM- CSW called the patients room and the line was busy. CSW will continue to make attempts to to make contact with the patient.   TOC will continue following.                Expected Discharge Plan: Skilled Nursing Facility Barriers to Discharge: Continued Medical Work up   Patient Goals and CMS Choice            Expected Discharge Plan and Services                                              Prior Living Arrangements/Services                       Activities of Daily Living   ADL Screening (condition at time of admission) Independently performs ADLs?: Yes (appropriate for developmental age) Does the patient have a NEW difficulty with bathing/dressing/toileting/self-feeding that is expected to last >3 days?: No Does the patient have a NEW difficulty with getting in/out of bed, walking, or climbing stairs that is expected to last >3 days?: No Does the patient have a NEW difficulty with communication that is expected to last >3 days?: No Is the patient deaf or have difficulty hearing?: Yes Does the patient have difficulty seeing, even when wearing glasses/contacts?: Yes Does the patient have difficulty concentrating, remembering, or making decisions?: No  Permission Sought/Granted                  Emotional Assessment              Admission diagnosis:   Sepsis (HCC) [A41.9] Severe sepsis (HCC) [A41.9, R65.20] Patient Active Problem List   Diagnosis Date Noted   Protein-calorie malnutrition, severe 04/15/2023   Severe sepsis (HCC) 04/14/2023   Sepsis (HCC) 04/12/2023   Pseudoaneurysm of right femoral artery (HCC) 03/22/2023   PAD (peripheral artery disease) (HCC) 03/22/2023   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) 02/24/2023   Encntr for surgical aftcr following surgery on the circ sys 02/24/2023   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) 02/24/2023   Cutaneous abscess of left foot 02/24/2023   Gangrene of toe of left foot (HCC) 02/10/2023   Cellulitis of left lower limb 02/09/2023   Sepsis due to cellulitis (HCC) 02/08/2023   Athscl heart disease of native coronary artery w/o ang pctrs 01/12/2023   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 01/12/2023   Unspecified hearing loss, unspecified ear 01/12/2023   Hyperlipidemia, unspecified 01/12/2023   Personal history of nicotine dependence 01/12/2023   Presence of aortocoronary bypass graft 01/12/2023   Other specified chronic obstructive pulmonary disease (HCC) 01/12/2023  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/12/2023   Unspecified osteoarthritis, unspecified site 01/12/2023   Unqualified visual loss, left eye, normal vision right eye 01/12/2023   Gastro-esophageal reflux disease with esophagitis, without bleeding 01/12/2023   Long term (current) use of oral hypoglycemic drugs 01/12/2023   Long term (current) use of anticoagulants 01/12/2023   Hypotension, unspecified 01/12/2023   Type 2 diabetes mellitus with diabetic chronic kidney disease (HCC) 01/12/2023   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, or unspecified chronic kidney disease (HCC) 01/12/2023   History of falling 01/12/2023   Gout, unspecified 01/12/2023   Dvrtclos of intest,  part unsp, 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 01/12/2023   Incisional hernia, without obstruction or gangrene 01/12/2023   High risk medication use 10/25/2022   Screening for tuberculosis 10/25/2022   Chronic atrial fibrillation (HCC) 08/15/2022   Cardiomyopathy (HCC) 08/15/2022   Temporal arteritis (HCC) 08/13/2022   Anemia in chronic kidney disease 08/04/2022   Protein-calorie malnutrition (HCC) 11/21/2018   Hx of CABG 08/30/2017   Ischemic cardiomyopathy 12/23/2015   Chronic systolic (congestive) heart failure (HCC) 12/23/2015   Acute gastric ulcer with hemorrhage 05/06/2013   Mild tricuspid regurgitation    Atrial enlargement, left    Diverticulosis    Hiatal hernia    Gout    DM2 (diabetes mellitus, type 2) (HCC)    GERD (gastroesophageal reflux disease)    Essential hypertension    PCP:  [name], MD Pharmacy:   [facility] [id number] - [place], [place] - [address] [place] [address] Phone: [phone] Fax: [phone]  [place] [facility] [address] [place] [address] Phone: [phone] Fax: [phone]     Social Drivers of Health (SDOH) Social History: SDOH Screenings   Food Insecurity: No Food Insecurity (04/13/2023)  Housing: Low Risk  (04/13/2023)  Transportation Needs: No Transportation Needs (04/13/2023)  Utilities: Not At Risk (04/12/2023)  Alcohol Screen: Low Risk  (07/22/2022)  Depression (PHQ2-9): Low Risk  (01/07/2023)  Financial Resource Strain: Low Risk  (08/24/2022)  Physical Activity: Sufficiently Active (07/10/2021)  Social Connections: Moderately Integrated (04/12/2023)  Stress: No Stress Concern Present (08/24/2022)  Tobacco Use: Medium Risk (04/13/2023)  Health Literacy: Adequate Health Literacy (08/24/2022)   SDOH Interventions:     Readmission Risk Interventions     No data to display

## 2023-04-17 ENCOUNTER — Inpatient Hospital Stay (HOSPITAL_COMMUNITY)

## 2023-04-17 DIAGNOSIS — I480 Paroxysmal atrial fibrillation: Secondary | ICD-10-CM | POA: Diagnosis not present

## 2023-04-17 DIAGNOSIS — K5901 Slow transit constipation: Secondary | ICD-10-CM | POA: Diagnosis not present

## 2023-04-17 LAB — CULTURE, BLOOD (ROUTINE X 2)
Culture: NO GROWTH
Culture: NO GROWTH

## 2023-04-17 LAB — VITAMIN A: Vitamin A (Retinoic Acid): 18 ug/dL — ABNORMAL LOW (ref 22.0–69.5)

## 2023-04-17 LAB — BASIC METABOLIC PANEL WITH GFR
Anion gap: 8 (ref 5–15)
BUN: 18 mg/dL (ref 8–23)
CO2: 23 mmol/L (ref 22–32)
Calcium: 8.5 mg/dL — ABNORMAL LOW (ref 8.9–10.3)
Chloride: 107 mmol/L (ref 98–111)
Creatinine, Ser: 0.98 mg/dL (ref 0.61–1.24)
GFR, Estimated: >60 mL/min (ref >60)
Glucose, Bld: 119 mg/dL — ABNORMAL HIGH (ref 70–99)
Potassium: 4 mmol/L (ref 3.5–5.1)
Sodium: 138 mmol/L (ref 135–145)

## 2023-04-17 LAB — CBC
HCT: 25.5 % — ABNORMAL LOW (ref 39.0–52.0)
Hemoglobin: 8.1 g/dL — ABNORMAL LOW (ref 13.0–17.0)
MCH: 30.8 pg (ref 26.0–34.0)
MCHC: 31.8 g/dL (ref 30.0–36.0)
MCV: 97 fL (ref 80.0–100.0)
Platelets: 232 10*3/uL (ref 150–400)
RBC: 2.63 MIL/uL — ABNORMAL LOW (ref 4.22–5.81)
RDW: 20.1 % — ABNORMAL HIGH (ref 11.5–15.5)
WBC: 9.8 10*3/uL (ref 4.0–10.5)
nRBC: 0.6 % — ABNORMAL HIGH (ref 0.0–0.2)

## 2023-04-17 LAB — GLUCOSE, CAPILLARY
Glucose-Capillary: 128 mg/dL — ABNORMAL HIGH (ref 70–99)
Glucose-Capillary: 108 mg/dL — ABNORMAL HIGH (ref 70–99)
Glucose-Capillary: 129 mg/dL — ABNORMAL HIGH (ref 70–99)
Glucose-Capillary: 77 mg/dL (ref 70–99)

## 2023-04-17 LAB — PHOSPHORUS: Phosphorus: 2.8 mg/dL (ref 2.5–4.6)

## 2023-04-17 LAB — MAGNESIUM: Magnesium: 2.2 mg/dL (ref 1.7–2.4)

## 2023-04-17 MED ORDER — METOPROLOL TARTRATE 50 MG PO TABS
75.0000 mg | ORAL_TABLET | Freq: Two times a day (BID) | ORAL | Status: DC
  Administered 2023-04-18 – 2023-04-19 (×3): 75 mg via ORAL
  Filled 2023-04-17 (×4): qty 1

## 2023-04-17 MED ORDER — APIXABAN 5 MG PO TABS
5.0000 mg | ORAL_TABLET | Freq: Two times a day (BID) | ORAL | Status: DC
  Administered 2023-04-17 – 2023-04-19 (×5): 5 mg via ORAL
  Filled 2023-04-17 (×5): qty 1

## 2023-04-17 MED ORDER — METOPROLOL TARTRATE 50 MG PO TABS
75.0000 mg | ORAL_TABLET | Freq: Once | ORAL | Status: AC
  Administered 2023-04-17: 75 mg via ORAL
  Filled 2023-04-17: qty 1

## 2023-04-17 NOTE — Plan of Care (Signed)
  Problem: Education: Goal: Knowledge of risk factors and measures for prevention of condition will improve Outcome: Progressing   Problem: Coping: Goal: Psychosocial and spiritual needs will be supported Outcome: Progressing   Problem: Respiratory: Goal: Will maintain a patent airway Outcome: Progressing Goal: Complications related to the disease process, condition or treatment will be avoided or minimized Outcome: Progressing   Problem: Fluid Volume: Goal: Hemodynamic stability will improve Outcome: Progressing   Problem: Clinical Measurements: Goal: Diagnostic test results will improve Outcome: Progressing Goal: Signs and symptoms of infection will decrease Outcome: Progressing   Problem: Respiratory: Goal: Ability to maintain adequate ventilation will improve Outcome: Progressing   Problem: Education: Goal: Knowledge of General Education information will improve Description: Including pain rating scale, medication(s)/side effects and non-pharmacologic comfort measures Outcome: Progressing   Problem: Health Behavior/Discharge Planning: Goal: Ability to manage health-related needs will improve Outcome: Progressing   Problem: Clinical Measurements: Goal: Will remain free from infection Outcome: Progressing Goal: Diagnostic test results will improve Outcome: Progressing Goal: Respiratory complications will improve Outcome: Progressing   Problem: Coping: Goal: Level of anxiety will decrease Outcome: Progressing   Problem: Elimination: Goal: Will not experience complications related to bowel motility Outcome: Progressing Goal: Will not experience complications related to urinary retention Outcome: Progressing   Problem: Pain Managment: Goal: General experience of comfort will improve and/or be controlled Outcome: Progressing   Problem: Safety: Goal: Ability to remain free from injury will improve Outcome: Progressing   Problem: Skin Integrity: Goal: Risk  for impaired skin integrity will decrease Outcome: Progressing   Problem: Clinical Measurements: Goal: Ability to maintain clinical measurements within normal limits will improve Outcome: Not Progressing Goal: Cardiovascular complication will be avoided Outcome: Not Progressing   Problem: Activity: Goal: Risk for activity intolerance will decrease Outcome: Not Progressing   Problem: Nutrition: Goal: Adequate nutrition will be maintained Outcome: Not Progressing

## 2023-04-17 NOTE — Progress Notes (Signed)
 Patient is in rapid atrial fibrillation.   He takes Lopressor 75 mg twice daily at home but this has been held for hypotension. BP is currently 120/68. Plan to give Lopressor 75 mg PO x1 now.

## 2023-04-17 NOTE — Progress Notes (Addendum)
 PROGRESS NOTE    Donald Rios  WGN:[id number] DOB: 05-Jun-1940 DOA: 04/12/2023 PCP: [name], MD    Brief Narrative:   Donald Rios is a 83 y.o. 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  On EMS arrival, patient was altered and noted to have a CBG of 49; D10 was administered with improvement of patient's glucose and mentation.  Patient reported some nausea and decreased oral intake.  Recently reported amputation of left fifth toe a few weeks prior and has been doing well since.  States he thinks he is on some antibiotics still.  Does have ongoing erythema/wound at left heel, also with complaints of cough over the last week.  Denies fever, no chills, no chest pain, no shortness of breath, no abdominal pain, no constipation.  In the ED, temperature 97.5 F, BP 80s-110 systolic, heart rate 100s-130s, RR teens-20s, on room air.  Sodium 134, CO2 19, BUN 34, creatinine 2.65, glucose 199, calcium 8.8, protein 6.1, AST 66, alkaline phosphatase 139.  WBC 17.2, hemoglobin 9.9, INR 1.7.  Lactic acid 4.6> 2.7.  COVID-19 positive.  Chest x-ray without acute abnormality.  Left foot x-ray without acute abnormality.  Blood cultures and urinalysis ordered.  Patient received vancomycin, cefepime, Flagyl in the ED and 2 L IV fluid bolus.  TRH consulted for admission for concerns of sepsis secondary to left lower extremity cellulitis with recent left fifth toe amputation.  Significant hospital events: 4/1: admit to [facility] broad abx c/f cellulitis L foot  4/2: PCCM consult for low NIBP readings, txf to ICU  4/3: BP still low normal. getting a PICC  4/4: transfer back to  [facility]  Assessment & Plan:   Sepsis, POA Left lower extremity cellulitis Patient presenting with redness to his left foot concerning for cellulitis in the setting of recent amputation of his fifth left digit.  MR left heel without contrast with no evidence of osteomyelitis, diffuse nonspecific edema intrinsic musculature of the foot reflective of infectious myositis or myopathy, no intramuscular collection identified, soft tissue irregularity posterior heel with nonspecific subcu edema consistent with cellulitis without fluid collection, mild tenosynovitis posterior tibialis tendon, chronic plantar fasciitis, distal Achilles tendinosis.  Initially started on empiric antibiotics with vancomycin, cefepime, metronidazole and now de-escalated to doxycycline at time of transfer from intensive care unit/PCCM. IV Cefepime/Flagyl 4/1>4/3, PO Doxy 4/3> Leukocytosis resolved.  Blood cultures x 2 negative to date. Sepsis and lower extremity cellulitis resolved.  Multiple wounds of bilateral lower extremities are clean without acute findings.  Covid-19 viral infection without pneumonia Chest x-ray with no acute cardiopulmonary disease process. -- Continue airborne/contact isolation precautions  Hypotension Hx Chronic systolic congestive heart failure, compensated Hx Essential hypertension Hx Paroxysmal atrial fibrillation At baseline on metoprolol tartrate 75 mg p.o. twice daily, furosemide 40 mg p.o. twice daily, spironolactone 25 mg p.o. twice daily, amiodarone 200 mg p.o. daily. Patient with hypotension during hospitalization requiring transfer to the ICU, now transferred back to floor.  Likely secondary to sepsis as above versus adrenal insufficiency given previous history of longstanding use of steroids which were tapered off by rheumatology with history of temporal arteritis.  Cortisol level within normal limits. -- Holding home furosemide, spironolactone --  s/p albumin 25 g IV every 6 hours x 4  doses -- Started on fludrocortisone 0.2 mg p.o. twice daily by PCCM -- Midodrine 15 mg p.o. 3 times daily -- Continue amiodarone 200 mg p.o. daily -- Eliquis was briefly held due to anemia in the absence of overt bleed.  Posttransfusion hemoglobin improved and stable.  Resumed Eliquis. Developed A-fib with RVR overnight, blood pressures stable with SBP in the 100s-110s, resumed home dose of metoprolol 75 Mg twice daily.  CAD s/p CABG -- Crestor 40 g p.o. daily  Acute metabolic encephalopathy/hypoglycemia: resolved DM2 Hemoglobin A1c 6.2 on 02/08/2023, well-controlled.  At baseline on Jardiance, glipizide 5 mg p.o. daily, and insulin aspart sliding scale.  On EMS arrival, patient was noted to be hypoglycemic and received D10. Stable inpatient control.  COPD Stable without clinical bronchospasm.  On room air without hypoxia.  GERD -- Protonix 40 mg p.o. daily  Vitamin D deficiency Vitamin D 25-hydroxy level low at 20.65. -- started on Cholecalciferol 1000 units PO daily  Acute on chronic anemia Anemia panel with iron 21, ferritin 1603, folate 7.1, vitamin B12 533 -- hgb 9.9>10.9>7.5>6.4 -- Following a unit of PRBC, hemoglobin up to 7.9 >8.1. -- Holding Eliquis as above Follow CBC in AM.  Gout -- Allopurinol 300 mg p.o. daily  Hx temporal arteritis Follows with rheumatology outpatient, Dr. Dimple Casey.  Currently on Actemra every 4 weeks.  Titrated off of prednisone.  Left foot wound, POA PVD/PAD s/p left fifth toe amputation Recent left fifth digit amputation by vascular surgery on 03/22/2023.  Patient with noted to have full-thickness wound to left dorsal foot, fifth digit, left medial foot.  Seen by wound RN. -- Cleanse L heel, L dorsal foot, L 5th digit, L medial foot and L great toe with soap and water, dry and apply Xeroform gauze to wound beds daily. Cover with dry gauze and Kerlix roll gauze. Place L foot in Prevalon boot to offload pressure Will attempt to remove his multiple  dressings of bilateral lower extremity and examine wounds in the next 1 to 2 days.  Discussed with RN to see if he is able to take pictures to go on patient's chart. Examined bilateral lower extremity wounds in detail with patient's nurse on 4/6 and they appeared clean without acute findings.  Constipation Continue MiraLAX 17 g twice daily, Senokot-S 1 tab twice daily and as needed Dulcolax suppository Has had multiple BMs since yesterday including a medium 1 today. Despite this he reported constipation, obtain KUB which had no acute findings or significant stool.  Weakness/debility/deconditioning Recent fall T12 fracture -- Continue PT/OT; recommending SNF placement -- TOC consult  DVT prophylaxis: Holding anticoagulation due to drop in hemoglobin-see discussion above apixaban (ELIQUIS) tablet 5 mg    Code Status: Full Code Family Communication: No family present at bedside this morning. Unable to reach spouse via phone.  Disposition Plan:  Level of care: Progressive Status is: Inpatient Medically ready for DC to SNF pending bed.    Consultants:  PCCM: Signed off 4/4  Procedures:  None  Antimicrobials:  Vancomycin 4/1-/1 Metronidazole 4/1-/3 Cefepime 4/1-4/3 Doxycycline 4/3>>   Subjective: Reports ongoing constipation despite multiple BMs since yesterday.  No abdominal pain.  Patient interviewed and examined with his nurse in the room.  No chest pain, palpitations or dyspnea.  Objective: Vitals:   04/17/23 0600 04/17/23 0800 04/17/23 1105 04/17/23 1113  BP: 100/61 104/69 102/65 110/74  Pulse: (!) 101  (!) 111   Resp:  18 (!)  30 20  Temp: 98.5 F (36.9 C)  97.9 F (36.6 C)   TempSrc: Oral     SpO2: 100%     Weight:      Height:       No intake or output data in the 24 hours ending 04/17/23 1454  Filed Weights   04/13/23 0332 04/15/23 0411 04/17/23 0500  Weight: 63.9 kg 66.2 kg 66 kg    Examination:  General exam: Elderly male, moderately built and  frail, chronically ill looking lying comfortably in bed Respiratory system: Clear to auscultation. Respiratory effort normal. Cardiovascular system: S1 & S2 heard, irregularly irregular and mildly tachycardic. No JVD, murmurs, rubs, gallops or clicks. No pedal edema.  Telemetry personally reviewed: A-fib with RVR in the 110s-120s. Gastrointestinal system: Abdomen is nondistended, soft and nontender. No organomegaly or masses felt. Normal bowel sounds heard. Central nervous system: Alert and oriented. No focal neurological deficits. Extremities: Symmetric 5 x 5 power.  Bilateral lower extremity dressings of multiple wounds were removed and examined.  Wounds clean without acute findings. Skin: As above Psychiatry: Judgement and insight appear normal. Mood & affect appropriate.               Data Reviewed: I have personally reviewed following labs and imaging studies  CBC: Recent Labs  Lab 04/12/23 1040 04/12/23 1056 04/13/23 0808 04/15/23 0400 04/16/23 0419 04/17/23 0500  WBC 17.2*  --  8.8 7.3 8.8 9.8  NEUTROABS 16.2*  --   --   --   --   --   HGB 9.9* 10.9* 7.5* 6.4* 7.9* 8.1*  HCT 32.2* 32.0* 24.0* 20.3* 24.4* 25.5*  MCV 104.2*  --  101.7* 100.5* 97.2 97.0  PLT 228  --  167 171 190 232   Basic Metabolic Panel: Recent Labs  Lab 04/13/23 0319 04/14/23 1410 04/15/23 0400 04/16/23 0419 04/17/23 0500  NA 134* 134* 136 139 138  K 3.0* 4.0 3.6 3.8 4.0  CL 104 105 107 109 107  CO2 19* 21* 22 22 23   GLUCOSE 78 100* 143* 125* 119*  BUN 29* 23 22 19 18   CREATININE 1.91* 1.42* 1.17 1.14 0.98  CALCIUM 8.0* 8.5* 8.5* 8.8* 8.5*  MG  --   --  1.9 2.2 2.2  PHOS  --   --  1.9* 1.9* 2.8   GFR: Estimated Creatinine Clearance: 55.2 mL/min (by C-G formula based on SCr of 0.98 mg/dL). Liver Function Tests: Recent Labs  Lab 04/12/23 1038 04/13/23 0319  AST 67* 52*  ALT 34 27  ALKPHOS 139* 99  BILITOT 0.9 0.9  PROT 6.1* 5.1*  ALBUMIN 2.3* 2.3*    Coagulation  Profile: Recent Labs  Lab 04/12/23 1040 04/13/23 0319  INR 1.7* 1.6*    CBG: Recent Labs  Lab 04/16/23 1149 04/16/23 1651 04/16/23 2131 04/17/23 0607 04/17/23 1102  GLUCAP 168* 186* 132* 128* 108*    Anemia Panel: Recent Labs    04/14/23 1900  VITAMINB12 533  FOLATE 7.1  FERRITIN 1,603*  TIBC NOT CALCULATED  IRON 21*    Recent Results (from the past 240 hours)  Resp panel by RT-PCR (RSV, Flu A&B, Covid) Anterior Nasal Swab     Status: Abnormal   Collection Time: 04/12/23  9:58 AM   Specimen: Anterior Nasal Swab  Result Value Ref Range Status   SARS Coronavirus 2 by RT PCR POSITIVE (A) NEGATIVE Final   Influenza A by PCR NEGATIVE NEGATIVE Final   Influenza B by PCR NEGATIVE NEGATIVE Final  Comment: (NOTE) The Xpert Xpress SARS-CoV-2/FLU/RSV plus assay is intended as an aid in the diagnosis of influenza from Nasopharyngeal swab specimens and should not be used as a sole basis for treatment. Nasal washings and aspirates are unacceptable for Xpert Xpress SARS-CoV-2/FLU/RSV testing.  Fact Sheet for Patients: BloggerCourse.com  Fact Sheet for Healthcare Providers: SeriousBroker.it  This test is not yet approved or cleared by the [place] FDA and has been authorized for detection and/or diagnosis of SARS-CoV-2 by FDA under an Emergency Use Authorization (EUA). This EUA will remain in effect (meaning this test can be used) for the duration of the COVID-19 declaration under Section 564(b)(1) of the Act, 21 U.S.C. section 360bbb-3(b)(1), unless the authorization is terminated or revoked.     Resp Syncytial Virus by PCR NEGATIVE NEGATIVE Final    Comment: (NOTE) Fact Sheet for Patients: BloggerCourse.com  Fact Sheet for Healthcare Providers: SeriousBroker.it  This test is not yet approved or cleared by the [place] FDA and has been authorized for  detection and/or diagnosis of SARS-CoV-2 by FDA under an Emergency Use Authorization (EUA). This EUA will remain in effect (meaning this test can be used) for the duration of the COVID-19 declaration under Section 564(b)(1) of the Act, 21 U.S.C. section 360bbb-3(b)(1), unless the authorization is terminated or revoked.  Performed at [facility], [address] [place], [place] [address]   Blood Culture (routine x 2)     Status: None   Collection Time: 04/12/23 10:40 AM   Specimen: BLOOD  Result Value Ref Range Status   Specimen Description BLOOD LEFT ANTECUBITAL  Final   Special Requests   Final    BOTTLES DRAWN AEROBIC AND ANAEROBIC Blood Culture results may not be optimal due to an inadequate volume of blood received in culture bottles   Culture   Final    NO GROWTH 5 DAYS Performed at [facility], [address] [place], [place] [address]    Report Status 04/17/2023 FINAL  Final  Blood Culture (routine x 2)     Status: None   Collection Time: 04/12/23 10:40 AM   Specimen: BLOOD  Result Value Ref Range Status   Specimen Description BLOOD RIGHT ANTECUBITAL  Final   Special Requests   Final    BOTTLES DRAWN AEROBIC AND ANAEROBIC Blood Culture results may not be optimal due to an inadequate volume of blood received in culture bottles   Culture   Final    NO GROWTH 5 DAYS Performed at [facility], [address], [place], [place] [address]    Report Status 04/17/2023 FINAL  Final  MRSA Next Gen by PCR, Nasal     Status: None   Collection Time: 04/13/23  1:53 AM   Specimen: Nasal Mucosa; Nasal Swab  Result Value Ref Range Status   MRSA by PCR Next Gen NOT DETECTED NOT DETECTED Final    Comment: (NOTE) The GeneXpert MRSA Assay (FDA approved for NASAL specimens only), is one component of a comprehensive MRSA colonization surveillance program. It is not intended to diagnose MRSA infection nor to guide or monitor treatment for MRSA infections. Test  performance is not FDA approved in patients less than 71 years old. Performed at [facility], [address], [place], [place] [address]          Radiology Studies: DG Abd Portable 1V Result Date: 04/17/2023 CLINICAL DATA:  [id number] Constipation by delayed colonic transit [id number] EXAM: PORTABLE ABDOMEN - 1 VIEW COMPARISON:  CT 08/19/2022 FINDINGS: Gas throughout nondistended bowel. No bowel obstruction or free air. Multiple calcified gallstones. No acute bony abnormality. IMPRESSION: No evidence of bowel obstruction or free air. Cholelithiasis. Electronically Signed   By: Charlett Nose M.D.   On: 04/17/2023 12:09         Scheduled Meds:  allopurinol  300 mg Oral Daily   amiodarone  200 mg Oral q AM   apixaban  5 mg Oral BID   ascorbic acid  250 mg Oral BID   atorvastatin  40 mg Oral Daily   Chlorhexidine Gluconate Cloth  6 each Topical Daily   cholecalciferol  1,000 Units Oral Daily   doxycycline  100 mg Oral Q12H   feeding supplement  237 mL Oral TID BM   fludrocortisone  0.2 mg Oral BID   insulin aspart  0-9 Units Subcutaneous TID WC   metoprolol tartrate  75 mg Oral BID   midodrine  15 mg Oral TID WC   mirtazapine  30 mg Oral QHS   multivitamin with minerals  1 tablet Oral Daily   pantoprazole  40 mg Oral Daily   polyethylene glycol  17 g Oral BID   senna-docusate  1 tablet Oral BID   sodium chloride flush  10-40 mL Intracatheter Q12H   sodium chloride flush  3 mL Intravenous Q12H   thiamine  100 mg Oral Daily   zinc sulfate (50mg  elemental zinc)  220 mg Oral Daily   Continuous Infusions:     LOS: 4 days   Marcellus Scott, MD,  FACP, [facility], [facility], [facility]   [facility] [place]     To contact the attending provider between 7A-7P or the covering provider during after hours 7P-7A, please log into the web site [email or web address] and access using universal [place] password for that web site. If you do not have the password,  please call the hospital operator.

## 2023-04-17 NOTE — TOC Initial Note (Signed)
 Transition of Care [facility]) - Initial/Assessment Note    Patient Details  Name: Donald Rios MRN: [id number] Date of Birth: [birth year]  [facility]) CM/SW Contact:    Jimmy Picket, LCSW Phone Number: 04/17/2023, 10:49 AM  Clinical Narrative:                  CSW received SNF consult. CSW spoke with pt over the phone. CSW introduced self and explained role at the hospital. Pt reports that PTA he was living with at home with his wife. Pt uses a rolling walker and a manual wheelchair at baseline. Pt states his wife assists with ADL's.   CSW reviewed PT/OT recommendations for SNF. Pt reports he feels SNF will be beneficial before returning home. CSW was unable to complete passr due to an error in the system, weekday CSW will have to complete. Pt gave CSW permission to fax out to facilities in the area. Pt has no preference of facility at this time. CSW gave pt medicare.gov rating list to review. CSW explained insurance auth process.  CSW will continue to follow.   Expected Discharge Plan: Skilled Nursing Facility Barriers to Discharge: Continued Medical Work up   Patient Goals and CMS Choice Patient states their goals for this hospitalization and ongoing recovery are:: To go to rehab CMS Medicare.gov Compare Post Acute Care list provided to:: Patient Choice offered to / list presented to : Patient [place] ownership interest in [facility].provided to:: Patient    Expected Discharge Plan and Services       Living arrangements for the past 2 months: Skilled Nursing Facility                                      Prior Living Arrangements/Services Living arrangements for the past 2 months: Skilled Nursing Facility Lives with:: Spouse Patient language and need for interpreter reviewed:: Yes Do you feel safe going back to the place where you live?: Yes      Need for Family Participation in Patient Care: Yes (Comment) Care giver support system in  place?: Yes (comment) Current home services: DME Criminal Activity/Legal Involvement Pertinent to Current Situation/Hospitalization: No - Comment as needed  Activities of Daily Living   ADL Screening (condition at time of admission) Independently performs ADLs?: Yes (appropriate for developmental age) Does the patient have a NEW difficulty with bathing/dressing/toileting/self-feeding that is expected to last >3 days?: No Does the patient have a NEW difficulty with getting in/out of bed, walking, or climbing stairs that is expected to last >3 days?: No Does the patient have a NEW difficulty with communication that is expected to last >3 days?: No Is the patient deaf or have difficulty hearing?: Yes Does the patient have difficulty seeing, even when wearing glasses/contacts?: Yes Does the patient have difficulty concentrating, remembering, or making decisions?: No  Permission Sought/Granted Permission sought to share information with : Facility Industrial/product designer granted to share information with : Yes, Verbal Permission Granted     Permission granted to share info w AGENCY: SNF        Emotional Assessment Appearance:: Appears older than stated age Attitude/Demeanor/Rapport: Engaged Affect (typically observed): Accepting Orientation: : Oriented to Self, Oriented to Place, Oriented to  Time, Oriented to Situation Alcohol / Substance Use: Not Applicable Psych Involvement: No (comment)  Admission diagnosis:  Sepsis (HCC) [A41.9] Severe sepsis (HCC) [A41.9, R65.20] Patient  Active Problem List   Diagnosis Date Noted   Protein-calorie malnutrition, severe 04/15/2023   Severe sepsis (HCC) 04/14/2023   Sepsis (HCC) 04/12/2023   Pseudoaneurysm of right femoral artery (HCC) 03/22/2023   PAD (peripheral artery disease) (HCC) 03/22/2023   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) 02/24/2023   Encntr for surgical aftcr following surgery  on the circ sys 02/24/2023   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) 02/24/2023   Cutaneous abscess of left foot 02/24/2023   Gangrene of toe of left foot (HCC) 02/10/2023   Cellulitis of left lower limb 02/09/2023   Sepsis due to cellulitis (HCC) 02/08/2023   Athscl heart disease of native coronary artery w/o ang pctrs 01/12/2023   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 01/12/2023   Unspecified hearing loss, unspecified ear 01/12/2023   Hyperlipidemia, unspecified 01/12/2023   Personal history of nicotine dependence 01/12/2023   Presence of aortocoronary bypass graft 01/12/2023   Other specified chronic obstructive pulmonary disease (HCC) 01/12/2023   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/12/2023   Unspecified osteoarthritis, unspecified site 01/12/2023   Unqualified visual loss, left eye, normal vision right eye 01/12/2023   Gastro-esophageal reflux disease with esophagitis, without bleeding 01/12/2023   Long term (current) use of oral hypoglycemic drugs 01/12/2023   Long term (current) use of anticoagulants 01/12/2023   Hypotension, unspecified 01/12/2023   Type 2 diabetes mellitus with diabetic chronic kidney disease (HCC) 01/12/2023   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, or unspecified chronic kidney disease (HCC) 01/12/2023   History of falling 01/12/2023   Gout, unspecified 01/12/2023   Dvrtclos of intest, part unsp, 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 01/12/2023   Incisional hernia, without obstruction or gangrene 01/12/2023   High risk medication use 10/25/2022   Screening for tuberculosis 10/25/2022   Chronic atrial fibrillation (HCC) 08/15/2022   Cardiomyopathy (HCC) 08/15/2022   Temporal arteritis (HCC) 08/13/2022   Anemia in chronic kidney disease 08/04/2022   Protein-calorie malnutrition (HCC) 11/21/2018   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 [place] [address] Phone: [phone] Fax: [phone]  [place] [facility] [address] [place] [address] Phone: [phone] Fax: [phone]     Social Drivers of Health (SDOH) Social History: SDOH Screenings   Food Insecurity: No Food Insecurity (04/13/2023)  Housing: Low Risk  (04/13/2023)  Transportation Needs: No Transportation Needs (04/13/2023)  Utilities: Not At Risk (04/12/2023)  Alcohol Screen: Low Risk  (07/22/2022)  Depression (PHQ2-9): Low Risk  (01/07/2023)  Financial Resource Strain: Low Risk  (08/24/2022)  Physical Activity: Sufficiently Active (07/10/2021)  Social Connections: Moderately Integrated (04/12/2023)  Stress: No Stress Concern Present (08/24/2022)  Tobacco Use: Medium Risk (04/13/2023)  Health Literacy: Adequate Health Literacy (08/24/2022)   SDOH Interventions:     Readmission Risk Interventions     No data  to display

## 2023-04-17 NOTE — NC FL2 (Signed)
  MEDICAID FL2 LEVEL OF CARE FORM     IDENTIFICATION  Patient Name: Donald Rios Birthdate: [birth year] Sex: male Admission Date (Current Location): 04/12/2023  [facility] and [place] Number:  Producer, television/film/video and Address:  [facility], [address] [place], [place] [address]      Provider Number: [id number]  Attending Physician Name and Address:  Elease Etienne, MD  Relative Name and Phone Number:       Current Level of Care: Hospital Recommended Level of Care: Skilled Nursing Facility Prior Approval Number:    Date Approved/Denied:   PASRR Number: pending  Discharge Plan: SNF    Current Diagnoses: Patient Active Problem List   Diagnosis Date Noted   Protein-calorie malnutrition, severe 04/15/2023   Severe sepsis (HCC) 04/14/2023   Sepsis (HCC) 04/12/2023   Pseudoaneurysm of right femoral artery (HCC) 03/22/2023   PAD (peripheral artery disease) (HCC) 03/22/2023   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) 02/24/2023   Encntr for surgical aftcr following surgery on the circ sys 02/24/2023   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) 02/24/2023   Cutaneous abscess of left foot 02/24/2023   Gangrene of toe of left foot (HCC) 02/10/2023   Cellulitis of left lower limb 02/09/2023   Sepsis due to cellulitis (HCC) 02/08/2023   Athscl heart disease of native coronary artery w/o ang pctrs 01/12/2023   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 01/12/2023   Unspecified hearing loss, unspecified ear 01/12/2023   Hyperlipidemia, unspecified 01/12/2023   Personal history of nicotine dependence 01/12/2023   Presence of aortocoronary bypass graft 01/12/2023   Other specified chronic obstructive pulmonary disease (HCC) 01/12/2023   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/12/2023   Unspecified osteoarthritis, unspecified site 01/12/2023   Unqualified visual loss, left eye,  normal vision right eye 01/12/2023   Gastro-esophageal reflux disease with esophagitis, without bleeding 01/12/2023   Long term (current) use of oral hypoglycemic drugs 01/12/2023   Long term (current) use of anticoagulants 01/12/2023   Hypotension, unspecified 01/12/2023   Type 2 diabetes mellitus with diabetic chronic kidney disease (HCC) 01/12/2023   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, or unspecified chronic kidney disease (HCC) 01/12/2023   History of falling 01/12/2023   Gout, unspecified 01/12/2023   Dvrtclos of intest, part unsp, 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 01/12/2023   Incisional hernia, without obstruction or gangrene 01/12/2023   High risk medication use 10/25/2022   Screening for tuberculosis 10/25/2022   Chronic atrial fibrillation (HCC) 08/15/2022   Cardiomyopathy (HCC) 08/15/2022   Temporal arteritis (HCC) 08/13/2022   Anemia in chronic kidney disease 08/04/2022   Protein-calorie malnutrition (HCC) 11/21/2018   Hx of CABG 08/30/2017   Ischemic cardiomyopathy 12/23/2015   Chronic systolic (congestive) heart failure (HCC) 12/23/2015   Acute gastric ulcer with hemorrhage 05/06/2013   Mild tricuspid regurgitation    Atrial enlargement, left    Diverticulosis    Hiatal hernia    Gout    DM2 (diabetes mellitus, type 2) (HCC)    GERD (gastroesophageal reflux disease)    Essential hypertension     Orientation RESPIRATION BLADDER Height & Weight     Self, Situation, Time, Place  Normal External catheter Weight: 145 lb 8.1 oz (66 kg) Height:  5\' 8"  (172.7 cm)  BEHAVIORAL SYMPTOMS/MOOD NEUROLOGICAL BOWEL NUTRITION STATUS  Continent Diet  AMBULATORY STATUS COMMUNICATION OF NEEDS Skin   Extensive Assist Verbally PU Stage and Appropriate Care, Other (Comment) (multiple stage 2 wounds across both buttocks)                       Personal Care Assistance Level of Assistance  Bathing,  Feeding, Dressing Bathing Assistance: Maximum assistance Feeding assistance: Limited assistance Dressing Assistance: Maximum assistance     Functional Limitations Info  Sight, Hearing, Speech Sight Info: Adequate Hearing Info: Adequate Speech Info: Adequate    SPECIAL CARE FACTORS FREQUENCY  PT (By licensed PT), OT (By licensed OT)     PT Frequency: 5x a week OT Frequency: 5x a week            Contractures Contractures Info: Not present    Additional Factors Info  Code Status, Allergies Code Status Info: Full Allergies Info: NKA           Current Medications (04/17/2023):  This is the current hospital active medication list Current Facility-Administered Medications  Medication Dose Route Frequency Provider Last Rate Last Admin   acetaminophen (TYLENOL) tablet 650 mg  650 mg Oral Q6H PRN Synetta Fail, MD   650 mg at 04/16/23 1213   Or   acetaminophen (TYLENOL) suppository 650 mg  650 mg Rectal Q6H PRN Synetta Fail, MD       allopurinol (ZYLOPRIM) tablet 300 mg  300 mg Oral Daily Lorin Glass, MD   300 mg at 04/17/23 0848   amiodarone (PACERONE) tablet 200 mg  200 mg Oral q AM Synetta Fail, MD   200 mg at 04/17/23 1610   apixaban (ELIQUIS) tablet 5 mg  5 mg Oral BID Hongalgi, Maximino Greenland, MD       ascorbic acid (VITAMIN C) tablet 250 mg  250 mg Oral BID Lorin Glass, MD   250 mg at 04/17/23 0849   atorvastatin (LIPITOR) tablet 40 mg  40 mg Oral Daily Lorin Glass, MD   40 mg at 04/17/23 0848   bisacodyl (DULCOLAX) suppository 10 mg  10 mg Rectal Daily PRN Elease Etienne, MD   10 mg at 04/16/23 1632   Chlorhexidine Gluconate Cloth 2 % PADS 6 each  6 each Topical Daily Lorin Glass, MD   6 each at 04/17/23 [phone]   cholecalciferol (VITAMIN D3) 25 MCG (1000 UNIT) tablet 1,000 Units  1,000 Units Oral Daily [place], Eric J, DO   1,000 Units at 04/17/23 0848   doxycycline (VIBRA-TABS) tablet 100 mg  100 mg Oral Q12H Lorin Glass, MD   100 mg at  04/17/23 0848   feeding supplement (ENSURE ENLIVE / ENSURE PLUS) liquid 237 mL  237 mL Oral TID BM Lorin Glass, MD   237 mL at 04/16/23 1355   fludrocortisone (FLORINEF) tablet 0.2 mg  0.2 mg Oral BID Lorin Glass, MD   0.2 mg at 04/17/23 0848   insulin aspart (novoLOG) injection 0-9 Units  0-9 Units Subcutaneous TID WC Synetta Fail, MD   1 Units at 04/17/23 [phone]   metoprolol tartrate (LOPRESSOR) tablet 75 mg  75 mg Oral BID Hongalgi, Theadora Rama D, MD       midodrine (PROAMATINE) tablet 15 mg  15 mg Oral TID WC Paliwal, Aditya, MD   15 mg at 04/17/23 0848   mirtazapine (REMERON) tablet 30 mg  30 mg Oral QHS Lorin Glass, MD   30 mg at 04/16/23 2132  multivitamin with minerals tablet 1 tablet  1 tablet Oral Daily Lorin Glass, MD   1 tablet at 04/17/23 0848   Oral care mouth rinse  15 mL Mouth Rinse PRN Briant Sites, DO       pantoprazole (PROTONIX) EC tablet 40 mg  40 mg Oral Daily Lorin Glass, MD   40 mg at 04/17/23 0848   polyethylene glycol (MIRALAX / GLYCOLAX) packet 17 g  17 g Oral BID Marcellus Scott D, MD   17 g at 04/17/23 0851   senna-docusate (Senokot-S) tablet 1 tablet  1 tablet Oral BID Marcellus Scott D, MD   1 tablet at 04/17/23 0849   sodium chloride flush (NS) 0.9 % injection 10-40 mL  10-40 mL Intracatheter Q12H Lorin Glass, MD   10 mL at 04/17/23 0849   sodium chloride flush (NS) 0.9 % injection 10-40 mL  10-40 mL Intracatheter PRN Lorin Glass, MD       sodium chloride flush (NS) 0.9 % injection 3 mL  3 mL Intravenous Q12H Synetta Fail, MD   3 mL at 04/16/23 1009   thiamine (VITAMIN B1) tablet 100 mg  100 mg Oral Daily Lorin Glass, MD   100 mg at 04/17/23 6213   zinc sulfate (50mg  elemental zinc) capsule 220 mg  220 mg Oral Daily Lorin Glass, MD   220 mg at 04/17/23 0848     Discharge Medications: Please see discharge summary for a list of discharge medications.  Relevant Imaging Results:  Relevant Lab Results:   Additional  Information SSN [id number], covid positive as of 04/12/23  Jimmy Picket, LCSW

## 2023-04-18 DIAGNOSIS — I480 Paroxysmal atrial fibrillation: Secondary | ICD-10-CM | POA: Diagnosis not present

## 2023-04-18 DIAGNOSIS — K5901 Slow transit constipation: Secondary | ICD-10-CM | POA: Diagnosis not present

## 2023-04-18 DIAGNOSIS — I4819 Other persistent atrial fibrillation: Secondary | ICD-10-CM

## 2023-04-18 LAB — CBC
HCT: 26.2 % — ABNORMAL LOW (ref 39.0–52.0)
Hemoglobin: 8.3 g/dL — ABNORMAL LOW (ref 13.0–17.0)
MCH: 31.6 pg (ref 26.0–34.0)
MCHC: 31.7 g/dL (ref 30.0–36.0)
MCV: 99.6 fL (ref 80.0–100.0)
Platelets: 263 10*3/uL (ref 150–400)
RBC: 2.63 MIL/uL — ABNORMAL LOW (ref 4.22–5.81)
RDW: 19.6 % — ABNORMAL HIGH (ref 11.5–15.5)
WBC: 9.5 10*3/uL (ref 4.0–10.5)
nRBC: 0.7 % — ABNORMAL HIGH (ref 0.0–0.2)

## 2023-04-18 LAB — BASIC METABOLIC PANEL WITH GFR
Anion gap: 7 (ref 5–15)
BUN: 16 mg/dL (ref 8–23)
CO2: 23 mmol/L (ref 22–32)
Calcium: 8.4 mg/dL — ABNORMAL LOW (ref 8.9–10.3)
Chloride: 107 mmol/L (ref 98–111)
Creatinine, Ser: 1.15 mg/dL (ref 0.61–1.24)
GFR, Estimated: >60 mL/min (ref >60)
Glucose, Bld: 110 mg/dL — ABNORMAL HIGH (ref 70–99)
Potassium: 3.9 mmol/L (ref 3.5–5.1)
Sodium: 137 mmol/L (ref 135–145)

## 2023-04-18 LAB — GLUCOSE, CAPILLARY
Glucose-Capillary: 96 mg/dL (ref 70–99)
Glucose-Capillary: 116 mg/dL — ABNORMAL HIGH (ref 70–99)
Glucose-Capillary: 155 mg/dL — ABNORMAL HIGH (ref 70–99)
Glucose-Capillary: 166 mg/dL — ABNORMAL HIGH (ref 70–99)

## 2023-04-18 LAB — MAGNESIUM: Magnesium: 2 mg/dL (ref 1.7–2.4)

## 2023-04-18 LAB — PHOSPHORUS: Phosphorus: 2.3 mg/dL — ABNORMAL LOW (ref 2.5–4.6)

## 2023-04-18 MED ORDER — VITAMIN A 3 MG (10000 UNIT) PO CAPS
10000.0000 [IU] | ORAL_CAPSULE | Freq: Every day | ORAL | Status: DC
  Administered 2023-04-18 – 2023-04-19 (×2): 10000 [IU] via ORAL
  Filled 2023-04-18 (×2): qty 1

## 2023-04-18 MED ORDER — DIGOXIN 0.25 MG/ML IJ SOLN
0.1250 mg | Freq: Once | INTRAMUSCULAR | Status: DC
  Filled 2023-04-18: qty 0.5

## 2023-04-18 MED ORDER — POTASSIUM PHOSPHATES 15 MMOLE/5ML IV SOLN
30.0000 mmol | Freq: Once | INTRAVENOUS | Status: AC
  Administered 2023-04-18: 30 mmol via INTRAVENOUS
  Filled 2023-04-18: qty 10

## 2023-04-18 MED ORDER — VITAMIN B-12 1000 MCG PO TABS
500.0000 ug | ORAL_TABLET | Freq: Every day | ORAL | Status: DC
  Administered 2023-04-18 – 2023-04-19 (×2): 500 ug via ORAL
  Filled 2023-04-18 (×2): qty 1

## 2023-04-18 NOTE — Progress Notes (Signed)
 Mobility Specialist Progress Note:    04/18/23 1100  Mobility  Activity Transferred from bed to chair  Level of Assistance Moderate assist, patient does 50-74%  Assistive Device Front wheel walker  Distance Ambulated (ft) 3 ft  LLE Weight Bearing Per Provider Order PWB  Activity Response Tolerated well  Mobility Referral Yes  Mobility visit 1 Mobility  Mobility Specialist Start Time (ACUTE ONLY) 1100  Mobility Specialist Stop Time (ACUTE ONLY) 1111  Mobility Specialist Time Calculation (min) (ACUTE ONLY) 11 min   Pt received in bed, agreeable to mobility session. Transferred B>C via RW and ModA to stand and pivot. Tolerated well, asx throughout. Left pt in chair with alarm on, call bell in reach, all needs met.   Feliciana Rossetti Mobility Specialist Please contact via Special educational needs teacher or  Rehab office at [phone]

## 2023-04-18 NOTE — Consult Note (Signed)
 Cardiology Consultation   Patient ID: Donald Rios MRN: [id number]; DOB: 1940-09-30  Admit date: 04/12/2023 Date of Consult: 04/18/2023  PCP:  [name], MD   [facility] Providers Cardiologist:  Chrystie Nose, MD   Patient Profile:   Donald Rios is a 83 y.o. 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 Waymon Amato.  History of Present Illness:   [name] underwent CABG x 4 in 2000 with LIMA-LAD, free RIMA-OM 2, sequential SVG-PLA-PLD.  Echocardiogram showed an LVEF 45-50%.  Nuclear stress test 2015 showed no ischemia.  Echocardiogram 2021 showed normal LVEF.  He apparently developed atrial fibrillation in the setting of temporal arteritis in 2024.  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  He underwent TEE-DCCV for atrial fibrillation 08/2022. TEE at that time showed LVEF 25-30%, normal RV function, moderate to severe MR. Amiodarone was continued. GDMT titrated and follow up echo 11/2022 showed continued LVEF 25%, moderately reduced RV function, moderate LAE, MR at least moderate. At follow up in clinic 08/23/2022, he had revered back to Afib. Options for rate and rhythm control were discussed.   Of note, he was recently tapered off steroids by rheumatology.   Last seen in clinic 03/2023 and was doing well on amiodarone, eliquis, lopressor, spironolactone and jardiance. He remained in Afib and he was referred to Afib clinic, has not seen yet.  He presented to [facility] ED 04/12/2023 with confusion, generalized weakness, and facial droop.  On arrival he was altered with a CBG of 40 mg.  In the preceding days he had poor p.o. intake following his recent left fifth toe amputation.  HR in the 100-130 range found to have lactic acidosis, leukocytosis and was COVID-19  positive.  IV antibiotics were started due to concern for left foot cellulitis and sepsis.  Antibiotics were narrowed to doxycycline p.o. Course complicated by hypotension requiring ICU care with PICC. Now transferred back to the floor.  Limited echo with LVEF 25-30%, moderate LAE. Cardiology consulted. Initially he seemed confused when asked what brought him to the hospital. He denies chest pain. He states his foot pain is improving. He continues to have COVID symptoms / URI. Overall, no cardiac complaints. He is unaware of his rhythm and rate.   He continues to have marginal BP and is now on 0.2 mg florinef BID and 15 mg midodrine TID.    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    History of MI (myocardial infarction)    History of nuclear stress test 08/07/2010   dipyridamole; EKG negative for ischemia, low risk scan    Hyperlipidemia    Hyperplastic colon polyp 05/2008   Hypertension    Ischemic cardiomyopathy  EF 45%, with inferior wall motion abnormality    Myocardial infarction (HCC)    Valvular regurgitation    mitral and tricuspid (mild)    Past Surgical History:  Procedure Laterality Date   ABDOMINAL AORTOGRAM W/LOWER EXTREMITY N/A 02/14/2023   Procedure: ABDOMINAL AORTOGRAM W/LOWER EXTREMITY;  Surgeon: Daria Pastures, MD;  Location: [facility] INVASIVE CV LAB;  Service: Cardiovascular;  Laterality: N/A;   AMPUTATION Left 03/22/2023   Procedure: FIFTH TOE AMPUTATION LEFT FOOT;  Surgeon: Daria Pastures, MD;  Location[facility];  Service:  Vascular;  Laterality: Left;   CARDIOVERSION N/A 08/16/2022   Procedure: CARDIOVERSION;  Surgeon: Jake Bathe, MD;  Location: [facility] INVASIVE CV LAB;  Service: Cardiovascular;  Laterality: N/A;   CORONARY ARTERY BYPASS GRAFT  2000   LIMA to LAD, free IMA to OM2, sequential graft to PLA & PLD   ESOPHAGOGASTRODUODENOSCOPY N/A 05/07/2013   Procedure: ESOPHAGOGASTRODUODENOSCOPY (EGD);  Surgeon: Iva Boop, MD;  Location: [facility] ENDOSCOPY;  Service: Endoscopy;  Laterality: N/A;   PERIPHERAL VASCULAR INTERVENTION Left 02/14/2023   Procedure: PERIPHERAL VASCULAR INTERVENTION;  Surgeon: Daria Pastures, MD;  Location: [facility] INVASIVE CV LAB;  Service: Cardiovascular;  Laterality: Left;  SFA   TEE WITHOUT CARDIOVERSION N/A 08/16/2022   Procedure: TRANSESOPHAGEAL ECHOCARDIOGRAM;  Surgeon: Jake Bathe, MD;  Location: [facility] INVASIVE CV LAB;  Service: Cardiovascular;  Laterality: N/A;   TRANSTHORACIC ECHOCARDIOGRAM  09/07/2010   EF 45-50%; LV systolic function mildly reduced; LA mildly dilated; mild-mod MR & mild-mod TR; aortic root sclerosis/calcification;      Home Medications:  Prior to Admission medications   Medication Sig Start Date End Date Taking? Authorizing Provider  allopurinol (ZYLOPRIM) 300 MG tablet TAKE 1 TABLET BY MOUTH DAILY Patient taking differently: Take 300 mg by mouth at bedtime. 01/28/23  Yes [name], MD  amiodarone (PACERONE) 200 MG tablet 1 tablet daily. Patient taking differently: Take 200 mg by mouth in the morning. 03/16/23  Yes Ronney Asters, NP  aspirin EC 81 MG tablet Take 1 tablet (81 mg total) by mouth daily. Swallow whole. 02/16/23  Yes Pahwani, Daleen Bo, MD  doxazosin (CARDURA) 2 MG tablet TAKE 1 TABLET BY MOUTH DAILY Patient taking differently: Take 2 mg by mouth at bedtime. 08/04/22  Yes [name], MD  ELIQUIS 5 MG TABS tablet TAKE 1 TABLET BY MOUTH 2 TIMES A DAY 03/23/23  Yes [name], MD  empagliflozin (JARDIANCE) 10 MG TABS tablet Take 1 tablet (10 mg  total) by mouth daily. Patient taking differently: Take 10 mg by mouth in the morning. 08/16/22  Yes Arty Baumgartner, NP  furosemide (LASIX) 40 MG tablet Take 1 tablet (40 mg total) by mouth 2 (two) times daily. 11/23/22  Yes [name], MD  glipiZIDE (GLIPIZIDE XL) 5 MG 24 hr tablet Take 1 tablet (5 mg total) by mouth daily with breakfast. 12/21/22  Yes [name], MD  HYDROcodone-acetaminophen (NORCO/VICODIN) 5-325 MG tablet Take 1 tablet by mouth every 6 (six) hours as needed for moderate pain (pain score 4-6). 03/25/23  Yes [name], MD  insulin aspart (FIASP FLEXTOUCH) 100 UNIT/ML FlexTouch Pen Before each meal 3 times a day, 140-199 - 2 units, 200-250 - 4 units, 251-299 - 6 units,  300-349 - 8 units,  350 or above 10 units. 04/07/23  Yes [name], MD  metoprolol tartrate (LOPRESSOR) 50 MG tablet TAKE 1 AND 1/2 TABLETS BY MOUTH TWO TIMES A DAY PT NEEDS CPE  APPOINTMENT FOR FUTURE REFILLS Patient taking differently: Take 75 mg by mouth 2 (two) times daily. 03/04/23  Yes [name], MD  mirtazapine (REMERON) 30 MG tablet TAKE 1 TABLET BY MOUTH EVERY NIGHT AT BEDTIME FOR APPETITE Patient taking differently: Take 30 mg by mouth at bedtime. TAKE 1 TABLET BY MOUTH EVERY NIGHT AT BEDTIME FOR APPETITE 03/07/23  Yes [name], MD  pantoprazole (PROTONIX) 40 MG tablet TAKE 1 TABLET BY MOUTH DAILY Patient taking differently: Take 40 mg by mouth in the morning. 03/04/23  Yes Pickard, Priscille Heidelberg, MD  Phenylephrine-APAP-guaiFENesin (TYLENOL SINUS SEVERE) 5-325-200 MG TABS Take 2 tablets by mouth daily as needed (congestion).   Yes [provider]  rosuvastatin (CRESTOR) 40 MG tablet Take 1 tablet (40 mg total) by mouth daily. Patient taking differently: Take 40 mg by mouth in the morning. 02/15/23 02/15/24 Yes Hughie Closs, MD  spironolactone (ALDACTONE) 25 MG tablet Take 1 tablet (25 mg total) by mouth 2 (two) times daily. 11/23/22 04/12/23 Yes PickardPriscille Heidelberg, MD   Tocilizumab (ACTEMRA IV) Inject 6 mg/kg into the vein every 28 (twenty-eight) days.   Yes [provider]    Inpatient Medications: Scheduled Meds:  allopurinol  300 mg Oral Daily   amiodarone  200 mg Oral q AM   apixaban  5 mg Oral BID   ascorbic acid  250 mg Oral BID   atorvastatin  40 mg Oral Daily   Chlorhexidine Gluconate Cloth  6 each Topical Daily   cholecalciferol  1,000 Units Oral Daily   vitamin B-12  500 mcg Oral Daily   feeding supplement  237 mL Oral TID BM   fludrocortisone  0.2 mg Oral BID   insulin aspart  0-9 Units Subcutaneous TID WC   metoprolol tartrate  75 mg Oral BID   midodrine  15 mg Oral TID WC   mirtazapine  30 mg Oral QHS   multivitamin with minerals  1 tablet Oral Daily   pantoprazole  40 mg Oral Daily   polyethylene glycol  17 g Oral BID   senna-docusate  1 tablet Oral BID   sodium chloride flush  10-40 mL Intracatheter Q12H   sodium chloride flush  3 mL Intravenous Q12H   thiamine  100 mg Oral Daily   vitamin A  10,000 Units Oral Daily   zinc sulfate (50mg  elemental zinc)  220 mg Oral Daily   Continuous Infusions:  potassium PHOSPHATE IVPB (in mmol)     PRN Meds: acetaminophen **OR** acetaminophen, bisacodyl, mouth rinse, sodium chloride flush  Allergies:   No Known Allergies  Social History:   Social History   Socioeconomic History   Marital status: Married    Spouse name: Not on file   Number of children: 1   Years of education: Not on file   Highest education level: Not on file  Occupational History   Not on file  Tobacco Use   Smoking status: Former    Current packs/day: 0.00    Types: Cigarettes    Quit date: 11/04/1985    Years since quitting: 37.4   Smokeless tobacco: Never  Vaping Use   Vaping status: Never Used  Substance and Sexual Activity   Alcohol use: Not Currently   Drug use: No   Sexual activity: Not on file  Other Topics Concern   Not on file  Social History Narrative   Not on file   Social  Drivers of Health   Financial Resource Strain: Low Risk  (  08/24/2022)   Overall Financial Resource Strain (CARDIA)    Difficulty of Paying Living Expenses: Not hard at all  Food Insecurity: No Food Insecurity (04/13/2023)   Hunger Vital Sign    Worried About Running Out of Food in the Last Year: Never true    Ran Out of Food in the Last Year: Never true  Transportation Needs: No Transportation Needs (04/13/2023)   PRAPARE - Administrator, Civil Service (Medical): No    Lack of Transportation (Non-Medical): No  Physical Activity: Sufficiently Active (07/10/2021)   Exercise Vital Sign    Days of Exercise per Week: 5 days    Minutes of Exercise per Session: 30 min  Stress: No Stress Concern Present (08/24/2022)   [employer] of Occupational Health - Occupational Stress Questionnaire    Feeling of Stress : Only a little  Social Connections: Moderately Integrated (04/12/2023)   Social Connection and Isolation Panel [NHANES]    Frequency of Communication with Friends and Family: Twice a week    Frequency of Social Gatherings with Friends and Family: Twice a week    Attends Religious Services: More than 4 times per year    Active Member of [employer] or Organizations: No    Attends Banker Meetings: Never    Marital Status: Married  Catering manager Violence: Not At Risk (04/13/2023)   Humiliation, Afraid, Rape, and Kick questionnaire    Fear of Current or Ex-Partner: No    Emotionally Abused: No    Physically Abused: No    Sexually Abused: No    Family History:    Family History  Problem Relation Age of Onset   Stroke Father    Hypertension Father    Diabetes Brother    Esophageal cancer Brother    Stroke Maternal Grandmother    Stroke Maternal Grandfather    Stomach cancer Maternal Aunt    Colon cancer Neg Hx    Rectal cancer Neg Hx      ROS:  Please see the history of present illness.   All other ROS reviewed and negative.     Physical Exam/Data:    Vitals:   04/17/23 2243 04/18/23 0245 04/18/23 0825 04/18/23 1128  BP: 111/72 106/67 116/63 109/71  Pulse:      Resp: 16 19 20 20   Temp: 97.8 F (36.6 C) 98.3 F (36.8 C) 98.5 F (36.9 C) 98.4 F (36.9 C)  TempSrc: Oral Oral Oral Oral  SpO2:      Weight:  64.1 kg    Height:        Intake/Output Summary (Last 24 hours) at 04/18/2023 1349 Last data filed at 04/18/2023 0830 Gross per 24 hour  Intake --  Output 1000 ml  Net -1000 ml      04/18/2023    2:45 AM 04/17/2023    5:00 AM 04/15/2023    4:11 AM  Last 3 Weights  Weight (lbs) 141 lb 5 oz 145 lb 8.1 oz 145 lb 15.1 oz  Weight (kg) 64.1 kg 66 kg 66.2 kg     Body mass index is 21.49 kg/m.  General:  thin male in NAD HEENT: normal Neck: no JVD Vascular: No carotid bruits; Distal pulses 2+ bilaterally Cardiac:  irregular rhythm, tachycardic rate Lungs:  respirations unlabored Abd: soft, nontender, no hepatomegaly  Ext: no edema Musculoskeletal:  No deformities, BUE and BLE strength normal and equal Skin: warm and dry  Neuro:  CNs 2-12 intact, no focal abnormalities noted Psych:  Normal  affect   EKG:  The EKG was personally reviewed and demonstrates:  atrial fibrillation with VR 127 IVCD, LVH Telemetry:  Telemetry was personally reviewed and demonstrates:  atrial fibrillation with VR initially 130s --> now 90-110s  Relevant CV Studies:  Limited echo 04/14/2023  1. Limited echo for LVEF   2. Left ventricular ejection fraction, by estimation, is 25 to 30%. Left  ventricular ejection fraction by PLAX is 30 %. The left ventricle has  severely decreased function. The left ventricle demonstrates global  hypokinesis.   3. Left atrial size was moderately dilated.   Laboratory Data:  High Sensitivity Troponin:  No results for input(s): "TROPONINIHS" in the last 720 hours.   Chemistry Recent Labs  Lab 04/16/23 0419 04/17/23 0500 04/18/23 0355  NA 139 138 137  K 3.8 4.0 3.9  CL 109 107 107  CO2 22 23 23   GLUCOSE 125*  119* 110*  BUN 19 18 16   CREATININE 1.14 0.98 1.15  CALCIUM 8.8* 8.5* 8.4*  MG 2.2 2.2 2.0  GFRNONAA >60 >60 >60  ANIONGAP 8 8 7     Recent Labs  Lab 04/12/23 1038 04/13/23 0319  PROT 6.1* 5.1*  ALBUMIN 2.3* 2.3*  AST 67* 52*  ALT 34 27  ALKPHOS 139* 99  BILITOT 0.9 0.9   Lipids No results for input(s): "CHOL", "TRIG", "HDL", "LABVLDL", "LDLCALC", "CHOLHDL" in the last 168 hours.  Hematology Recent Labs  Lab 04/16/23 0419 04/17/23 0500 04/18/23 0355  WBC 8.8 9.8 9.5  RBC 2.51* 2.63* 2.63*  HGB 7.9* 8.1* 8.3*  HCT 24.4* 25.5* 26.2*  MCV 97.2 97.0 99.6  MCH 31.5 30.8 31.6  MCHC 32.4 31.8 31.7  RDW 20.3* 20.1* 19.6*  PLT 190 232 263   Thyroid No results for input(s): "TSH", "FREET4" in the last 168 hours.  BNP Recent Labs  Lab 04/13/23 0759 04/14/23 1900  BNP 582.2* 1,199.9*    DDimer No results for input(s): "DDIMER" in the last 168 hours.   Radiology/Studies:  DG Abd Portable 1V Result Date: 04/17/2023 CLINICAL DATA:  [id number] Constipation by delayed colonic transit [id number] EXAM: PORTABLE ABDOMEN - 1 VIEW COMPARISON:  CT 08/19/2022 FINDINGS: Gas throughout nondistended bowel. No bowel obstruction or free air. Multiple calcified gallstones. No acute bony abnormality. IMPRESSION: No evidence of bowel obstruction or free air. Cholelithiasis. Electronically Signed   By: Charlett Nose M.D.   On: 04/17/2023 12:09     Assessment and Plan:   Atrial fibrillation with RVR Hx of persistent atrial fibrillation - in the setting of COVID-19 and recent amputation - on 75 mg lopressor BID and 200 mg amiodarone - given persistent atrial fibrillation, may ultimately discontnue amiodarone - with ongoing infection, would defer repeat DCCV at this time - with hypotension, can add digoxin to attempt better rate control, we don't have many options considering CHF and BP - can also increase lopressor to 100 mg BID - continue eliquis   Chronic systolic heart failure Hx of  hypertension Hypotension  - LVEF 45-50% at the time of CABG, reduced to 25-30% at the time of AFib - unable to titrate GDMT for now given marginal BP - as above, will trial digoxin - consolidate lopressor to toprol prior to discharge - receiving IVF with medications, will try to limit fluids - holding diuretic, spironolactone - has received albumin to support BP - also on florinef and midodrine   CAD s/p CABG Hyperlipidemia with LDL Goal < 70 - denies chest pain - continue 40 mg lipitor -  had muscle pain at higher doses 12/02/2022: Cholesterol 151; HDL 54.60; LDL Cholesterol 76; Triglycerides 101.0; VLDL 20.2   Anemia - Hb 8.3 (8.1), no active bleeding on eliquis - stable after transfusion     Risk Assessment/Risk Scores:     New York Heart Association (NYHA) Functional Class NYHA Class II  CHA2DS2-VASc Score = 6   This indicates a 9.7% annual risk of stroke. The patient's score is based upon: CHF History: 1 HTN History: 1 Diabetes History: 1 Stroke History: 0 Vascular Disease History: 1 Age Score: 2 Gender Score: 0      For questions or updates, please contact  [facility] Please consult [email or web address] for contact info under    Signed, Marcelino Duster, PA  04/18/2023 1:49 PM

## 2023-04-18 NOTE — Progress Notes (Addendum)
 PROGRESS NOTE    Donald Rios  ZOX:[id number] DOB: [birth year] DOA: 04/12/2023 PCP: [name], MD    Brief Narrative:   Donald Rios is a 83 y.o. 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  On EMS arrival, patient was altered and noted to have a CBG of 49; D10 was administered with improvement of patient's glucose and mentation.  Patient reported some nausea and decreased oral intake.  Recently reported amputation of left fifth toe a few weeks prior and has been doing well since.  States he thinks he is on some antibiotics still.  Does have ongoing erythema/wound at left heel, also with complaints of cough over the last week.  Denies fever, no chills, no chest pain, no shortness of breath, no abdominal pain, no constipation.  In the ED, temperature 97.5 F, BP 80s-110 systolic, heart rate 100s-130s, RR teens-20s, on room air.  Sodium 134, CO2 19, BUN 34, creatinine 2.65, glucose 199, calcium 8.8, protein 6.1, AST 66, alkaline phosphatase 139.  WBC 17.2, hemoglobin 9.9, INR 1.7.  Lactic acid 4.6> 2.7.  COVID-19 positive.  Chest x-ray without acute abnormality.  Left foot x-ray without acute abnormality.  Blood cultures and urinalysis ordered.  Patient received vancomycin, cefepime, Flagyl in the ED and 2 L IV fluid bolus.  TRH consulted for admission for concerns of sepsis secondary to left lower extremity cellulitis with recent left fifth toe amputation.  Significant hospital events: 4/1: admit to [facility] broad abx c/f cellulitis L foot  4/2: PCCM consult for low NIBP readings, txf to ICU  4/3: BP still low normal. getting a PICC  4/4: transfer back to  [facility]  Assessment & Plan:   Sepsis, POA Left lower extremity cellulitis Patient presenting with redness to his left foot concerning for cellulitis in the setting of recent amputation of his fifth left digit.  MR left heel without contrast with no evidence of osteomyelitis, diffuse nonspecific edema intrinsic musculature of the foot reflective of infectious myositis or myopathy, no intramuscular collection identified, soft tissue irregularity posterior heel with nonspecific subcu edema consistent with cellulitis without fluid collection, mild tenosynovitis posterior tibialis tendon, chronic plantar fasciitis, distal Achilles tendinosis.  Initially started on empiric antibiotics with vancomycin, cefepime, metronidazole and now de-escalated to doxycycline at time of transfer from intensive care unit/PCCM. IV Cefepime/Flagyl 4/1>4/3, PO Doxy 4/3> Leukocytosis resolved.  Blood cultures x 2 negative to date. Sepsis and lower extremity cellulitis resolved.  Multiple wounds of bilateral lower extremities are clean without acute findings.  Covid-19 viral infection without pneumonia Chest x-ray with no acute cardiopulmonary disease process. -- Continue airborne/contact isolation precautions  Hypotension Hx Chronic systolic congestive heart failure, compensated Hx Essential hypertension Hx Paroxysmal atrial fibrillation, now with RVR/mild At baseline on metoprolol tartrate 75 mg p.o. twice daily, furosemide 40 mg p.o. twice daily, spironolactone 25 mg p.o. twice daily, amiodarone 200 mg p.o. daily. Patient with hypotension during hospitalization requiring transfer to the ICU, now transferred back to floor.  Likely secondary to sepsis as above versus adrenal insufficiency given previous history of longstanding use of steroids which were tapered off by rheumatology with history of temporal arteritis.  Cortisol level within normal limits. -- Holding home  furosemide, spironolactone -- s/p albumin 25 g IV every 6  hours x 4 doses -- Started on fludrocortisone 0.2 mg p.o. twice daily by PCCM -- Midodrine 15 mg p.o. 3 times daily -- Continue amiodarone 200 mg p.o. daily -- Eliquis was briefly held due to anemia in the absence of overt bleed.  Posttransfusion hemoglobin improved and stable.  Resumed Eliquis. A-fib with mild RVR in the 100s-110s, resumed prior home dose of metoprolol 75 Mg twice daily with improved control in the 90s-100s.  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  Transiently increase amiodarone dose versus up metoprolol to 100 Mg twice daily versus adding low-dose digoxin.  CAD s/p CABG -- Crestor 40 g p.o. daily  Acute metabolic encephalopathy/hypoglycemia: resolved DM2 Hemoglobin A1c 6.2 on 02/08/2023, well-controlled.  At baseline on Jardiance, glipizide 5 mg p.o. daily, and insulin aspart sliding scale.  On EMS arrival, patient was noted to be hypoglycemic and received D10. Stable inpatient control.  COPD Stable without clinical bronchospasm.  On room air without hypoxia.  GERD -- Protonix 40 mg p.o. daily  Vitamin D deficiency Vitamin D 25-hydroxy level low at 20.65. -- started on Cholecalciferol 1000 units PO daily  Acute on chronic anemia Anemia panel with iron 21, ferritin 1603, folate 7.1, vitamin B12 533 -- Following a unit of PRBC, hemoglobin up from 6.4 on 4/4 gradually up to 8.3. Follow CBC periodically.  Gout -- Allopurinol 300 mg p.o. daily  Hx temporal arteritis Follows with rheumatology outpatient, Dr. Dimple Casey.  Currently on Actemra every 4 weeks.  Titrated off of prednisone.  Left foot wound, POA PVD/PAD s/p left fifth toe amputation Recent left fifth digit amputation by vascular surgery on 03/22/2023.  Patient with noted to have full-thickness wound to left dorsal foot, fifth digit, left medial foot.  Seen by wound RN. -- Cleanse L heel, L dorsal  foot, L 5th digit, L medial foot and L great toe with soap and water, dry and apply Xeroform gauze to wound beds daily. Cover with dry gauze and Kerlix roll gauze. Place L foot in Prevalon boot to offload pressure Will attempt to remove his multiple dressings of bilateral lower extremity and examine wounds in the next 1 to 2 days.  Discussed with RN to see if he is able to take pictures to go on patient's chart. Examined bilateral lower extremity wounds in detail with patient's nurse on 4/6 and they appeared clean without acute findings.  Constipation Continue MiraLAX 17 g twice daily, Senokot-S 1 tab twice daily and as needed Dulcolax suppository Now having regular BMs.  KUB without acute findings/no stool burden.  Multiple vitamin deficiencies Management as per registered dietitian, have initiated vitamin D and B12 supplements.  Weakness/debility/deconditioning Recent fall T12 fracture -- Continue PT/OT; recommending SNF placement -- TOC on board and working on SNF placement.  DVT prophylaxis: Holding anticoagulation due to drop in hemoglobin-see discussion above apixaban (ELIQUIS) tablet 5 mg    Code Status: Full Code Family Communication: No family present at bedside this morning. Unable to reach spouse via phone yesterday.  Disposition Plan:  Level of care: Progressive Status is: Inpatient Medically ready for DC to SNF pending bed.    Consultants:  PCCM: Signed off 4/4  Procedures:  None  Antimicrobials:  Vancomycin 4/1-/1 Metronidazole 4/1-/3 Cefepime 4/1-4/3 Doxycycline 4/3>>   Subjective: Having regular BMs.  Refused laxatives today.  Overall weak but no specific physical complaints.  Objective: Vitals:   04/17/23 2243 04/18/23 0245 04/18/23 0825 04/18/23 1128  BP: 111/72 106/67 116/63 109/71  Pulse:      Resp: 16 19 20 20   Temp: 97.8 F (36.6 C) 98.3 F (36.8 C) 98.5 F (36.9 C) 98.4 F (36.9 C)  TempSrc: Oral Oral Oral Oral  SpO2:      Weight:  64.1  kg    Height:        Intake/Output Summary (Last 24 hours) at 04/18/2023 1324 Last data filed at 04/18/2023 0830 Gross per 24 hour  Intake --  Output 1000 ml  Net -1000 ml    Filed Weights   04/15/23 0411 04/17/23 0500 04/18/23 0245  Weight: 66.2 kg 66 kg 64.1 kg    Examination:  General exam: Elderly male, moderately built and frail, chronically ill looking sitting up comfortably in reclining chair. Respiratory system: Clear to auscultation. Respiratory effort normal. Cardiovascular system: S1 & S2 heard, irregularly irregular and mildly tachycardic. No JVD, murmurs, rubs, gallops or clicks. No pedal edema.  Telemetry personally reviewed: A-fib with RVR with rates in the 90s - 100s. Gastrointestinal system: Abdomen is nondistended, soft and nontender. No organomegaly or masses felt. Normal bowel sounds heard. Central nervous system: Alert and oriented. No focal neurological deficits. Extremities: Symmetric 5 x 5 power.  Bilateral lower extremity dressings of multiple wounds were removed and examined on 4/6.  Wounds clean without acute findings. Skin: As above Psychiatry: Judgement and insight appear normal. Mood & affect appropriate.               Data Reviewed: I have personally reviewed following labs and imaging studies  CBC: Recent Labs  Lab 04/12/23 1040 04/12/23 1056 04/13/23 0808 04/15/23 0400 04/16/23 0419 04/17/23 0500 04/18/23 0355  WBC 17.2*  --  8.8 7.3 8.8 9.8 9.5  NEUTROABS 16.2*  --   --   --   --   --   --   HGB 9.9*   < > 7.5* 6.4* 7.9* 8.1* 8.3*  HCT 32.2*   < > 24.0* 20.3* 24.4* 25.5* 26.2*  MCV 104.2*  --  101.7* 100.5* 97.2 97.0 99.6  PLT 228  --  167 171 190 232 263   < > = values in this interval not displayed.   Basic Metabolic Panel: Recent Labs  Lab 04/14/23 1410 04/15/23 0400 04/16/23 0419 04/17/23 0500 04/18/23 0355  NA 134* 136 139 138 137  K 4.0 3.6 3.8 4.0 3.9  CL 105 107 109 107 107  CO2 21* 22 22 23 23   GLUCOSE 100*  143* 125* 119* 110*  BUN 23 22 19 18 16   CREATININE 1.42* 1.17 1.14 0.98 1.15  CALCIUM 8.5* 8.5* 8.8* 8.5* 8.4*  MG  --  1.9 2.2 2.2 2.0  PHOS  --  1.9* 1.9* 2.8 2.3*   GFR: Estimated Creatinine Clearance: 45.7 mL/min (by C-G formula based on SCr of 1.15 mg/dL). Liver Function Tests: Recent Labs  Lab 04/12/23 1038 04/13/23 0319  AST 67* 52*  ALT 34 27  ALKPHOS 139* 99  BILITOT 0.9 0.9  PROT 6.1* 5.1*  ALBUMIN 2.3* 2.3*    Coagulation Profile: Recent Labs  Lab 04/12/23 1040 04/13/23 0319  INR 1.7* 1.6*    CBG: Recent Labs  Lab 04/17/23 1102 04/17/23 1601 04/17/23 2126 04/18/23 0632 04/18/23 1123  GLUCAP 108* 129* 77 96 116*    Anemia Panel: No results for input(s): "VITAMINB12", "FOLATE", "FERRITIN", "TIBC", "IRON", "RETICCTPCT" in the last 72 hours.  Recent Results (from the past 240 hours)  Resp panel by RT-PCR (RSV, Flu A&B, Covid) Anterior Nasal Swab     Status: Abnormal   Collection Time: 04/12/23  9:58 AM   Specimen: Anterior Nasal Swab  Result Value Ref Range Status   SARS Coronavirus 2 by RT PCR POSITIVE (A) NEGATIVE Final   Influenza A by PCR NEGATIVE NEGATIVE Final   Influenza B by PCR NEGATIVE NEGATIVE Final    Comment: (NOTE) The Xpert Xpress SARS-CoV-2/FLU/RSV plus assay is intended as an aid in the diagnosis of influenza from Nasopharyngeal swab specimens and should not be used as a sole basis for treatment. Nasal washings and aspirates are unacceptable for Xpert Xpress SARS-CoV-2/FLU/RSV testing.  Fact Sheet for Patients: BloggerCourse.com  Fact Sheet for Healthcare Providers: SeriousBroker.it  This test is not yet approved or cleared by the [place] FDA and has been authorized for detection and/or diagnosis of SARS-CoV-2 by FDA under an Emergency Use Authorization (EUA). This EUA will remain in effect (meaning this test can be used) for the duration of the COVID-19 declaration  under Section 564(b)(1) of the Act, 21 U.S.C. section 360bbb-3(b)(1), unless the authorization is terminated or revoked.     Resp Syncytial Virus by PCR NEGATIVE NEGATIVE Final    Comment: (NOTE) Fact Sheet for Patients: BloggerCourse.com  Fact Sheet for Healthcare Providers: SeriousBroker.it  This test is not yet approved or cleared by the [place] FDA and has been authorized for detection and/or diagnosis of SARS-CoV-2 by FDA under an Emergency Use Authorization (EUA). This EUA will remain in effect (meaning this test can be used) for the duration of the COVID-19 declaration under Section 564(b)(1) of the Act, 21 U.S.C. section 360bbb-3(b)(1), unless the authorization is terminated or revoked.  Performed at [facility], [address], [place], [place] [address]   Blood Culture (routine x 2)     Status: None   Collection Time: 04/12/23 10:40 AM   Specimen: BLOOD  Result Value Ref Range Status   Specimen Description BLOOD LEFT ANTECUBITAL  Final   Special Requests   Final    BOTTLES DRAWN AEROBIC AND ANAEROBIC Blood Culture results may not be optimal due to an inadequate volume of blood received in culture bottles   Culture   Final    NO GROWTH 5 DAYS Performed at [facility], [address], [place], [place] [address]    Report Status 04/17/2023 FINAL  Final  Blood Culture (routine x 2)     Status: None   Collection Time: 04/12/23 10:40 AM   Specimen: BLOOD  Result Value Ref Range Status   Specimen Description BLOOD RIGHT ANTECUBITAL  Final   Special Requests   Final    BOTTLES DRAWN AEROBIC AND ANAEROBIC Blood Culture results may not be optimal due to an inadequate volume of blood received in culture bottles   Culture   Final    NO GROWTH 5 DAYS Performed at [facility], [address], [place], [place] [address]    Report Status 04/17/2023 FINAL  Final  MRSA Next Gen by PCR, Nasal      Status: None   Collection Time: 04/13/23  1:53 AM   Specimen: Nasal Mucosa; Nasal Swab  Result Value Ref Range Status   MRSA by PCR Next Gen NOT DETECTED NOT DETECTED Final    Comment: (NOTE) The GeneXpert MRSA Assay (FDA approved for NASAL specimens only), is one component of a comprehensive MRSA colonization surveillance program.  It is not intended to diagnose MRSA infection nor to guide or monitor treatment for MRSA infections. Test performance is not FDA approved in patients less than 62 years old. Performed at [facility], [address] [place], [place] [address]          Radiology Studies: DG Abd Portable 1V Result Date: 04/17/2023 CLINICAL DATA:  [id number] Constipation by delayed colonic transit [id number] EXAM: PORTABLE ABDOMEN - 1 VIEW COMPARISON:  CT 08/19/2022 FINDINGS: Gas throughout nondistended bowel. No bowel obstruction or free air. Multiple calcified gallstones. No acute bony abnormality. IMPRESSION: No evidence of bowel obstruction or free air. Cholelithiasis. Electronically Signed   By: Charlett Nose M.D.   On: 04/17/2023 12:09         Scheduled Meds:  allopurinol  300 mg Oral Daily   amiodarone  200 mg Oral q AM   apixaban  5 mg Oral BID   ascorbic acid  250 mg Oral BID   atorvastatin  40 mg Oral Daily   Chlorhexidine Gluconate Cloth  6 each Topical Daily   cholecalciferol  1,000 Units Oral Daily   vitamin B-12  500 mcg Oral Daily   feeding supplement  237 mL Oral TID BM   fludrocortisone  0.2 mg Oral BID   insulin aspart  0-9 Units Subcutaneous TID WC   metoprolol tartrate  75 mg Oral BID   midodrine  15 mg Oral TID WC   mirtazapine  30 mg Oral QHS   multivitamin with minerals  1 tablet Oral Daily   pantoprazole  40 mg Oral Daily   polyethylene glycol  17 g Oral BID   senna-docusate  1 tablet Oral BID   sodium chloride flush  10-40 mL Intracatheter Q12H   sodium chloride flush  3 mL Intravenous Q12H   thiamine  100 mg Oral Daily   vitamin A   10,000 Units Oral Daily   zinc sulfate (50mg  elemental zinc)  220 mg Oral Daily   Continuous Infusions:     LOS: 5 days   Marcellus Scott, MD,  FACP, [facility], [facility], [facility]   [facility] Advisor [place]     To contact the attending provider between 7A-7P or the covering provider during after hours 7P-7A, please log into the web site [email or web address] and access using universal [place] password for that web site. If you do not have the password, please call the hospital operator.

## 2023-04-18 NOTE — Progress Notes (Signed)
 Nutrition Brief Note  Micronutrient lab results: CRP - 8.8 Vitamin D - 20.65 (L) Vitamin A - 18.0 (L) Vitamin B12 - 533 (wdl) however this lab is typically elevated in acutely ill patients; recommend supplementation Vitamin C - pending Zinc - pending Vitamin B6 - pending  Reached out to MD regarding supplementation for these labs. MD agreeable to order.   Drusilla Kanner, RDN, LDN Clinical Nutrition See AMiON for contact information.

## 2023-04-18 NOTE — TOC Progression Note (Addendum)
 Transition of Care [facility]) - Progression Note    Patient Details  Name: Donald Rios MRN: [id number] Date of Birth: [birth year]  Transition of Care [facility] CM/SW Contact  Eduard Roux, [place] Phone Number: 04/18/2023, 2:08 PM  Clinical Narrative:     [name] # [id number]  Returned call to patient's wife , explained SNF bed offers where left in the room with the patient- they will need to review and call CSW with SNF choice. She states understanding.   Antony Blackbird, MSW, LCSW Clinical Social Worker    Expected Discharge Plan: Skilled Nursing Facility Barriers to Discharge: Continued Medical Work up  Expected Discharge Plan and Services       Living arrangements for the past 2 months: Skilled Nursing Facility                                       Social Determinants of Health (SDOH) Interventions SDOH Screenings   Food Insecurity: No Food Insecurity (04/13/2023)  Housing: Low Risk  (04/13/2023)  Transportation Needs: No Transportation Needs (04/13/2023)  Utilities: Not At Risk (04/12/2023)  Alcohol Screen: Low Risk  (07/22/2022)  Depression (PHQ2-9): Low Risk  (01/07/2023)  Financial Resource Strain: Low Risk  (08/24/2022)  Physical Activity: Sufficiently Active (07/10/2021)  Social Connections: Moderately Integrated (04/12/2023)  Stress: No Stress Concern Present (08/24/2022)  Tobacco Use: Medium Risk (04/13/2023)  Health Literacy: Adequate Health Literacy (08/24/2022)    Readmission Risk Interventions     No data to display

## 2023-04-18 NOTE — Plan of Care (Signed)
  Problem: Education: Goal: Knowledge of risk factors and measures for prevention of condition will improve Outcome: Progressing   Problem: Coping: Goal: Psychosocial and spiritual needs will be supported Outcome: Progressing   Problem: Clinical Measurements: Goal: Diagnostic test results will improve Outcome: Progressing Goal: Signs and symptoms of infection will decrease Outcome: Progressing   Problem: Respiratory: Goal: Ability to maintain adequate ventilation will improve Outcome: Progressing   Problem: Metabolic: Goal: Ability to maintain appropriate glucose levels will improve Outcome: Progressing   Problem: Nutritional: Goal: Maintenance of adequate nutrition will improve Outcome: Progressing   Problem: Elimination: Goal: Will not experience complications related to bowel motility Outcome: Progressing Goal: Will not experience complications related to urinary retention Outcome: Progressing   Problem: Pain Managment: Goal: General experience of comfort will improve and/or be controlled Outcome: Progressing   Problem: Safety: Goal: Ability to remain free from injury will improve Outcome: Progressing   Problem: Skin Integrity: Goal: Risk for impaired skin integrity will decrease Outcome: Progressing

## 2023-04-19 DIAGNOSIS — S199XXA Unspecified injury of neck, initial encounter: Secondary | ICD-10-CM | POA: Diagnosis not present

## 2023-04-19 DIAGNOSIS — M316 Other giant cell arteritis: Secondary | ICD-10-CM | POA: Diagnosis not present

## 2023-04-19 DIAGNOSIS — J449 Chronic obstructive pulmonary disease, unspecified: Secondary | ICD-10-CM | POA: Diagnosis not present

## 2023-04-19 DIAGNOSIS — Z743 Need for continuous supervision: Secondary | ICD-10-CM | POA: Diagnosis not present

## 2023-04-19 DIAGNOSIS — J4489 Other specified chronic obstructive pulmonary disease: Secondary | ICD-10-CM | POA: Diagnosis not present

## 2023-04-19 DIAGNOSIS — R4182 Altered mental status, unspecified: Secondary | ICD-10-CM | POA: Diagnosis not present

## 2023-04-19 DIAGNOSIS — I70244 Atherosclerosis of native arteries of left leg with ulceration of heel and midfoot: Secondary | ICD-10-CM | POA: Diagnosis not present

## 2023-04-19 DIAGNOSIS — I5022 Chronic systolic (congestive) heart failure: Secondary | ICD-10-CM | POA: Diagnosis not present

## 2023-04-19 DIAGNOSIS — S0990XA Unspecified injury of head, initial encounter: Secondary | ICD-10-CM | POA: Diagnosis not present

## 2023-04-19 DIAGNOSIS — J90 Pleural effusion, not elsewhere classified: Secondary | ICD-10-CM | POA: Diagnosis not present

## 2023-04-19 DIAGNOSIS — R278 Other lack of coordination: Secondary | ICD-10-CM | POA: Diagnosis not present

## 2023-04-19 DIAGNOSIS — R627 Adult failure to thrive: Secondary | ICD-10-CM | POA: Diagnosis not present

## 2023-04-19 DIAGNOSIS — R2689 Other abnormalities of gait and mobility: Secondary | ICD-10-CM | POA: Diagnosis not present

## 2023-04-19 DIAGNOSIS — I959 Hypotension, unspecified: Secondary | ICD-10-CM | POA: Diagnosis not present

## 2023-04-19 DIAGNOSIS — Z7401 Bed confinement status: Secondary | ICD-10-CM | POA: Diagnosis not present

## 2023-04-19 DIAGNOSIS — U071 COVID-19: Secondary | ICD-10-CM | POA: Diagnosis not present

## 2023-04-19 DIAGNOSIS — Z79899 Other long term (current) drug therapy: Secondary | ICD-10-CM | POA: Diagnosis not present

## 2023-04-19 DIAGNOSIS — D849 Immunodeficiency, unspecified: Secondary | ICD-10-CM | POA: Diagnosis not present

## 2023-04-19 DIAGNOSIS — I13 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, or unspecified chronic kidney disease: Secondary | ICD-10-CM | POA: Diagnosis not present

## 2023-04-19 DIAGNOSIS — S299XXA Unspecified injury of thorax, initial encounter: Secondary | ICD-10-CM | POA: Diagnosis not present

## 2023-04-19 DIAGNOSIS — I10 Essential (primary) hypertension: Secondary | ICD-10-CM | POA: Diagnosis not present

## 2023-04-19 DIAGNOSIS — S3993XA Unspecified injury of pelvis, initial encounter: Secondary | ICD-10-CM | POA: Diagnosis not present

## 2023-04-19 DIAGNOSIS — I499 Cardiac arrhythmia, unspecified: Secondary | ICD-10-CM | POA: Diagnosis not present

## 2023-04-19 DIAGNOSIS — I2510 Atherosclerotic heart disease of native coronary artery without angina pectoris: Secondary | ICD-10-CM | POA: Diagnosis not present

## 2023-04-19 DIAGNOSIS — E785 Hyperlipidemia, unspecified: Secondary | ICD-10-CM | POA: Diagnosis not present

## 2023-04-19 DIAGNOSIS — E118 Type 2 diabetes mellitus with unspecified complications: Secondary | ICD-10-CM | POA: Diagnosis not present

## 2023-04-19 DIAGNOSIS — L0390 Cellulitis, unspecified: Secondary | ICD-10-CM | POA: Diagnosis not present

## 2023-04-19 DIAGNOSIS — Z89429 Acquired absence of other toe(s), unspecified side: Secondary | ICD-10-CM | POA: Diagnosis not present

## 2023-04-19 DIAGNOSIS — E1122 Type 2 diabetes mellitus with diabetic chronic kidney disease: Secondary | ICD-10-CM | POA: Diagnosis not present

## 2023-04-19 DIAGNOSIS — W19XXXA Unspecified fall, initial encounter: Secondary | ICD-10-CM | POA: Diagnosis not present

## 2023-04-19 DIAGNOSIS — W06XXXA Fall from bed, initial encounter: Secondary | ICD-10-CM | POA: Diagnosis not present

## 2023-04-19 DIAGNOSIS — I517 Cardiomegaly: Secondary | ICD-10-CM | POA: Diagnosis not present

## 2023-04-19 DIAGNOSIS — E119 Type 2 diabetes mellitus without complications: Secondary | ICD-10-CM | POA: Diagnosis not present

## 2023-04-19 DIAGNOSIS — L988 Other specified disorders of the skin and subcutaneous tissue: Secondary | ICD-10-CM | POA: Diagnosis not present

## 2023-04-19 DIAGNOSIS — L03116 Cellulitis of left lower limb: Secondary | ICD-10-CM | POA: Diagnosis not present

## 2023-04-19 DIAGNOSIS — L89153 Pressure ulcer of sacral region, stage 3: Secondary | ICD-10-CM | POA: Diagnosis not present

## 2023-04-19 DIAGNOSIS — R6520 Severe sepsis without septic shock: Secondary | ICD-10-CM | POA: Diagnosis not present

## 2023-04-19 DIAGNOSIS — I739 Peripheral vascular disease, unspecified: Secondary | ICD-10-CM | POA: Diagnosis not present

## 2023-04-19 DIAGNOSIS — E876 Hypokalemia: Secondary | ICD-10-CM | POA: Diagnosis not present

## 2023-04-19 DIAGNOSIS — M8580 Other specified disorders of bone density and structure, unspecified site: Secondary | ICD-10-CM | POA: Diagnosis not present

## 2023-04-19 DIAGNOSIS — S22089A Unspecified fracture of T11-T12 vertebra, initial encounter for closed fracture: Secondary | ICD-10-CM | POA: Diagnosis not present

## 2023-04-19 DIAGNOSIS — R6889 Other general symptoms and signs: Secondary | ICD-10-CM | POA: Diagnosis not present

## 2023-04-19 DIAGNOSIS — M6281 Muscle weakness (generalized): Secondary | ICD-10-CM | POA: Diagnosis not present

## 2023-04-19 DIAGNOSIS — G319 Degenerative disease of nervous system, unspecified: Secondary | ICD-10-CM | POA: Diagnosis not present

## 2023-04-19 DIAGNOSIS — E43 Unspecified severe protein-calorie malnutrition: Secondary | ICD-10-CM | POA: Diagnosis not present

## 2023-04-19 DIAGNOSIS — A419 Sepsis, unspecified organism: Secondary | ICD-10-CM | POA: Diagnosis not present

## 2023-04-19 DIAGNOSIS — I482 Chronic atrial fibrillation, unspecified: Secondary | ICD-10-CM | POA: Diagnosis not present

## 2023-04-19 DIAGNOSIS — Z7901 Long term (current) use of anticoagulants: Secondary | ICD-10-CM | POA: Diagnosis not present

## 2023-04-19 DIAGNOSIS — K219 Gastro-esophageal reflux disease without esophagitis: Secondary | ICD-10-CM | POA: Diagnosis not present

## 2023-04-19 DIAGNOSIS — Z89422 Acquired absence of other left toe(s): Secondary | ICD-10-CM | POA: Diagnosis not present

## 2023-04-19 DIAGNOSIS — I480 Paroxysmal atrial fibrillation: Secondary | ICD-10-CM | POA: Diagnosis not present

## 2023-04-19 DIAGNOSIS — I110 Hypertensive heart disease with heart failure: Secondary | ICD-10-CM | POA: Diagnosis not present

## 2023-04-19 DIAGNOSIS — I4510 Unspecified right bundle-branch block: Secondary | ICD-10-CM | POA: Diagnosis not present

## 2023-04-19 DIAGNOSIS — I70245 Atherosclerosis of native arteries of left leg with ulceration of other part of foot: Secondary | ICD-10-CM | POA: Diagnosis not present

## 2023-04-19 DIAGNOSIS — Y92129 Unspecified place in nursing home as the place of occurrence of the external cause: Secondary | ICD-10-CM | POA: Diagnosis not present

## 2023-04-19 DIAGNOSIS — E440 Moderate protein-calorie malnutrition: Secondary | ICD-10-CM | POA: Diagnosis not present

## 2023-04-19 DIAGNOSIS — L97521 Non-pressure chronic ulcer of other part of left foot limited to breakdown of skin: Secondary | ICD-10-CM | POA: Diagnosis not present

## 2023-04-19 DIAGNOSIS — S22088D Other fracture of T11-T12 vertebra, subsequent encounter for fracture with routine healing: Secondary | ICD-10-CM | POA: Diagnosis not present

## 2023-04-19 DIAGNOSIS — R0989 Other specified symptoms and signs involving the circulatory and respiratory systems: Secondary | ICD-10-CM | POA: Diagnosis not present

## 2023-04-19 DIAGNOSIS — E0821 Diabetes mellitus due to underlying condition with diabetic nephropathy: Secondary | ICD-10-CM | POA: Diagnosis not present

## 2023-04-19 DIAGNOSIS — D631 Anemia in chronic kidney disease: Secondary | ICD-10-CM | POA: Diagnosis not present

## 2023-04-19 DIAGNOSIS — Z794 Long term (current) use of insulin: Secondary | ICD-10-CM | POA: Diagnosis not present

## 2023-04-19 LAB — GLUCOSE, CAPILLARY
Glucose-Capillary: 101 mg/dL — ABNORMAL HIGH (ref 70–99)
Glucose-Capillary: 126 mg/dL — ABNORMAL HIGH (ref 70–99)
Glucose-Capillary: 212 mg/dL — ABNORMAL HIGH (ref 70–99)

## 2023-04-19 LAB — VITAMIN B6: Vitamin B6: 2.6 ug/L — ABNORMAL LOW (ref 3.4–65.2)

## 2023-04-19 LAB — VITAMIN C: Vitamin C: 0.3 mg/dL — ABNORMAL LOW (ref 0.4–2.0)

## 2023-04-19 LAB — PHOSPHORUS: Phosphorus: 3 mg/dL (ref 2.5–4.6)

## 2023-04-19 MED ORDER — FLUDROCORTISONE ACETATE 0.1 MG PO TABS: 0.2000 mg | ORAL_TABLET | Freq: Two times a day (BID) | ORAL | Status: DC

## 2023-04-19 MED ORDER — DIGOXIN 125 MCG PO TABS: 0.1250 mg | ORAL_TABLET | Freq: Every day | ORAL | Status: DC

## 2023-04-19 MED ORDER — CYANOCOBALAMIN 500 MCG PO TABS: 500.0000 ug | ORAL_TABLET | Freq: Every day | ORAL | Status: DC

## 2023-04-19 MED ORDER — ADULT MULTIVITAMIN W/MINERALS CH: 1.0000 | ORAL_TABLET | Freq: Every day | ORAL | Status: DC

## 2023-04-19 MED ORDER — ATORVASTATIN CALCIUM 40 MG PO TABS
40.0000 mg | ORAL_TABLET | Freq: Every day | ORAL | Status: DC
Start: 2023-04-20 — End: 2023-05-12

## 2023-04-19 MED ORDER — POLYETHYLENE GLYCOL 3350 17 G PO PACK: 17.0000 g | PACK | Freq: Every day | ORAL | Status: DC

## 2023-04-19 MED ORDER — VITAMIN A 3 MG (10000 UNIT) PO CAPS: 10000.0000 [IU] | ORAL_CAPSULE | Freq: Every day | ORAL | Status: DC

## 2023-04-19 MED ORDER — AMIODARONE HCL 200 MG PO TABS: 200.0000 mg | ORAL_TABLET | Freq: Every morning | ORAL | Status: DC

## 2023-04-19 MED ORDER — INSULIN ASPART 100 UNIT/ML IJ SOLN: 0.0000 [IU] | Freq: Three times a day (TID) | INTRAMUSCULAR | Status: DC

## 2023-04-19 MED ORDER — ZINC SULFATE 220 (50 ZN) MG PO CAPS: 220.0000 mg | ORAL_CAPSULE | Freq: Every day | ORAL | Status: DC

## 2023-04-19 MED ORDER — VITAMIN B-1 100 MG PO TABS: 100.0000 mg | ORAL_TABLET | Freq: Every day | ORAL | Status: DC

## 2023-04-19 MED ORDER — ASCORBIC ACID 250 MG PO TABS: 250.0000 mg | ORAL_TABLET | Freq: Two times a day (BID) | ORAL | Status: DC

## 2023-04-19 MED ORDER — BISACODYL 10 MG RE SUPP: 10.0000 mg | Freq: Every day | RECTAL | Status: DC | PRN

## 2023-04-19 MED ORDER — METOPROLOL TARTRATE 50 MG PO TABS: 75.0000 mg | ORAL_TABLET | Freq: Two times a day (BID) | ORAL | Status: DC

## 2023-04-19 MED ORDER — DIGOXIN 125 MCG PO TABS
0.1250 mg | ORAL_TABLET | Freq: Every day | ORAL | Status: DC
  Administered 2023-04-19: 0.125 mg via ORAL
  Filled 2023-04-19: qty 1

## 2023-04-19 MED ORDER — SENNOSIDES-DOCUSATE SODIUM 8.6-50 MG PO TABS: 1.0000 | ORAL_TABLET | Freq: Every day | ORAL | Status: DC

## 2023-04-19 MED ORDER — ACETAMINOPHEN 325 MG PO TABS: 650.0000 mg | ORAL_TABLET | Freq: Four times a day (QID) | ORAL | Status: DC | PRN

## 2023-04-19 MED ORDER — MIDODRINE HCL 5 MG PO TABS: 15.0000 mg | ORAL_TABLET | Freq: Three times a day (TID) | ORAL | Status: DC

## 2023-04-19 MED ORDER — VITAMIN D3 25 MCG PO TABS: 1000.0000 [IU] | ORAL_TABLET | Freq: Every day | ORAL | Status: DC

## 2023-04-19 MED ORDER — ENSURE ENLIVE PO LIQD: 237.0000 mL | Freq: Three times a day (TID) | ORAL | Status: DC

## 2023-04-19 NOTE — Progress Notes (Signed)
 Arrived to patient room. Educated patient on PICC line removal. HOB less than 45*. Pt held breath upon line removal. Pressure held without s/sx of bleeding. Instructed to notify RN/staff for any s/sx of bleeding. Instructed to remain in bed for and keep drsg CDI for 24 hours. Pt VU. Tomasita Morrow, RN VAST

## 2023-04-19 NOTE — Progress Notes (Signed)
 Patient in recliner requesting to get back in bed to use bedpan. Offered to use bedside commode and patient declined stating that he could wait until in bed. Assisted to bed with special walker and stand and pivot. Patient had difficulty standing and pivoting. Placed on bedpan and patient requested to stay on bedpan for a little while. Notified NT while I went to lunch. Per NT patient did not call. Took patient off bedpan and cleaned him and changed sacral dressing. Patient BM loose from previous laxatives. Pt resting with call bell within reach.  Will continue to monitor.

## 2023-04-19 NOTE — Discharge Instructions (Signed)

## 2023-04-19 NOTE — Progress Notes (Addendum)
 3 week f/u scheduled per MD.   Added to AVS request to please have nursing facility draw digoxin level on Monday 4/14 or Tuesday 4/15 in the morning before digoxin dose for that day, requested by Dr. Rennis Golden, with request to call result to [phone] and fax copy to [phone].

## 2023-04-19 NOTE — Plan of Care (Signed)
  Problem: Education: Goal: Knowledge of risk factors and measures for prevention of condition will improve Outcome: Progressing   Problem: Coping: Goal: Psychosocial and spiritual needs will be supported Outcome: Progressing   Problem: Respiratory: Goal: Will maintain a patent airway Outcome: Progressing   Problem: Fluid Volume: Goal: Hemodynamic stability will improve Outcome: Progressing   Problem: Clinical Measurements: Goal: Diagnostic test results will improve Outcome: Progressing   Problem: Respiratory: Goal: Ability to maintain adequate ventilation will improve Outcome: Progressing   Problem: Education: Goal: Knowledge of General Education information will improve Description: Including pain rating scale, medication(s)/side effects and non-pharmacologic comfort measures Outcome: Progressing   Problem: Health Behavior/Discharge Planning: Goal: Ability to manage health-related needs will improve Outcome: Progressing   Problem: Clinical Measurements: Goal: Ability to maintain clinical measurements within normal limits will improve Outcome: Progressing Goal: Will remain free from infection Outcome: Progressing Goal: Diagnostic test results will improve Outcome: Progressing Goal: Respiratory complications will improve Outcome: Progressing Goal: Cardiovascular complication will be avoided Outcome: Progressing   Problem: Activity: Goal: Risk for activity intolerance will decrease Outcome: Progressing

## 2023-04-19 NOTE — Discharge Summary (Signed)
 Physician Discharge Summary  Donald Rios:[id number] DOB: 1940/07/05  PCP: Donald Brooks, MD  Admitted from: Home Discharged to: SNF  Admit date: 04/12/2023 Discharge date: 04/19/2023  Recommendations for Outpatient Follow-up:    Contact information for follow-up providers     Donald Fabian, NP Follow up.   Why: IMPORTANT DISREGARD NORTHLINE ADDRESS LISTED IN DUPICATE APPOINTMENT BELOW.   We have scheduled cardiology follow-up Thursday May 12, 2023 at 10:55 AM. Donald Rios 15 minutes prior to appointment to check in. This will be in our new [employer] location.  You have a follow-up appointment with a provider at [facility] in [place].  We are currently in the process of transitioning from two locations to one.  Effective May 09, 2023, all appointments that were previously scheduled at either our [facility] or [place] locations will be moved to our new location at [address] [place], [place], [address].  The phone number for our new location will be [phone]. Contact information: [facility] [phone] [address] [place], [place], [address]        Labwork Follow up.   Why: Please have nursing facility draw digoxin level on Monday 4/14 or Tuesday 4/15 in the morning before digoxin dose for that day, requested by Dr. Rennis Golden. Please call result to [phone] and fax copy to [phone].        MD at SNF Follow up.   Why: To be seen in 3 to 4 days with repeat labs (CBC & BMP).  Recommend consulting wound care at SNF.        Donald Brooks, MD Follow up.   Specialty: Family Medicine Why: To be seen upon discharge from SNF. Contact information: [address] [place] [address] [phone]         Donald Plan, MD. Schedule an appointment as soon as possible for a visit.   Specialty: Rheumatology Contact information: [address] [place] [address] [phone]               Contact information for after-discharge care     Destination     [facility] .   Service: Skilled Nursing Contact information: [address] [place] [address] [phone]                      Home Health: None    Equipment/Devices: TBD at [facility].    Discharge Condition: Improved and stable.   Code Status: Full Code Diet recommendation:  Discharge Diet Orders (From admission, onward)     Start     Ordered   04/19/23 0000  Diet - low sodium heart healthy        04/19/23 1508   04/19/23 0000  Diet Carb Modified        04/19/23 1508             Discharge Diagnoses:  Principal Problem:   Sepsis (HCC) Active Problems:   Athscl heart disease of native coronary artery w/o ang pctrs   DM2 (diabetes mellitus, type 2) (HCC)   GERD (gastroesophageal reflux disease)   Hyperlipidemia, unspecified   Essential hypertension   Chronic systolic (congestive) heart failure (HCC)   Hx of CABG   Anemia in chronic kidney disease   Chronic atrial fibrillation (HCC)   PAD (peripheral artery disease) (HCC)   Severe sepsis (HCC)   Protein-calorie malnutrition, severe   Brief Narrative:  Donald Rios is a 83 y.o. 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  On EMS arrival, patient was altered and noted to have a CBG of 49; D10 was administered with improvement of patient's glucose and mentation.     In the ED, temperature 97.5 F, BP 80s-110 systolic, heart rate 100s-130s, RR teens-20s, on room air.  Sodium 134, CO2 19, BUN 34, creatinine 2.65, glucose 199, calcium 8.8,  protein 6.1, AST 66, alkaline phosphatase 139.  WBC 17.2, hemoglobin 9.9, INR 1.7.  Lactic acid 4.6> 2.7.  COVID-19 positive.  Chest x-ray without acute abnormality.  Left foot x-ray without acute abnormality.  Blood cultures and urinalysis ordered.  Patient received vancomycin, cefepime, Flagyl in the ED and 2 L IV fluid bolus.  TRH consulted for admission for concerns of sepsis secondary to left lower extremity cellulitis with recent left fifth toe amputation.   Significant hospital events: 4/1: admit to  broad abx c/f cellulitis L foot  4/2: PCCM consult for low NIBP readings, txf to ICU  4/3: BP still low normal. getting a PICC  4/4: transfer back to [facility]   Assessment & Rios:   Sepsis, POA Left lower extremity cellulitis Patient p/w redness to his left foot concerning for cellulitis in the setting of recent amputation of his fifth left digit.   MR left heel without contrast with no evidence of osteomyelitis, had some cellulitis features.   Initially started on empiric antibiotics with vancomycin, cefepime, metronidazole and then de-escalated to doxycycline IV Cefepime/Flagyl 4/1>4/3, PO Doxy 4/3> 4/6, completed course. Leukocytosis resolved.  Blood cultures x 2 negative to date. Sepsis and lower extremity cellulitis resolved.  Multiple wounds of bilateral lower extremities are clean without acute findings. Continue diligent wound care at SNF.   Covid-19 viral infection without pneumonia Chest x-ray with no acute cardiopulmonary disease process. Continue airborne/contact isolation precautions   Hypotension Hx Chronic systolic congestive heart failure, compensated Hx Essential hypertension Hx Paroxysmal atrial fibrillation, now with RVR/mild At baseline on metoprolol tartrate 75 mg p.o. twice daily, furosemide 40 mg p.o. twice daily, spironolactone 25 mg p.o. twice daily, amiodarone 200 mg p.o. daily. Patient with hypotension during hospitalization requiring transfer to the ICU.   Likely secondary to sepsis Vs adrenal insufficiency given previous history of longstanding use of steroids which were tapered off by rheumatology with history of temporal arteritis.  Cortisol level within normal limits. -- Holding home furosemide, spironolactone.  Clinically euvolemic. -- s/p albumin 25 g IV every 6 hours x 4 doses -- Started on fludrocortisone 0.2 mg p.o. twice daily by PCCM. Midodrine 15 mg p.o. 3 times daily.  Ongoing soft blood pressures with several SBP's in the 100 range. -- Continue amiodarone 200 mg p.o. daily -- Eliquis was briefly held due to anemia in the absence of overt bleed and subsequently resumed.   A-fib with mild RVR in the 100s-110s, resumed prior home dose of metoprolol 75 Mg twice daily with improved control in the 90s-100s.  Monitor closely on telemetry Consulted cardiology for assistance with management of A-fib in the context of cardiomyopathy (LVEF 25-30% this admission). Cardiology signed off appreciated.  Have started low-dose digoxin 0.125 Mg daily.  As per Cardiology  recommendations, nursing facility to draw Digoxin level on Monday 4/14 or Tuesday 4/15 in the morning before digoxin dose for that day, requested by Dr. Rennis Golden, with request to call result to [phone] and fax copy to [phone].  Has outpatient follow-up arranged in 3 weeks.   CAD s/p CABG Currently on atorvastatin 40 Mg daily.   Acute metabolic encephalopathy/hypoglycemia: resolved DM2 Hemoglobin A1c 6.2 on 02/08/2023, well-controlled.  At baseline on Jardiance, glipizide 5 mg p.o. daily, and insulin aspart sliding scale.  On EMS arrival, patient was noted to be hypoglycemic and received D10. Stable inpatient control. Given tight control, hypoglycemia on arrival, for now discontinued glipizide, continue sensitive SSI at SNF.  Also resume Jardiance Monitor CBGs closely at SNF.   COPD Stable without clinical bronchospasm.  On room air without hypoxia.   GERD Protonix 40 mg p.o.  daily   Vitamin D deficiency/multiple vitamin deficiencies Vitamin D 25-hydroxy level low at 20.65. started on Cholecalciferol 1000 units PO daily.  Follow levels as outpatient. Registered dietitian has checked and started multiple vitamin supplements.  Continue same for 30 days.  Follow outstanding vitamin/mineral levels Management at SNF and as outpatient as deemed necessary.   Acute on chronic anemia Anemia panel with iron 21, ferritin 1603, folate 7.1, vitamin B12 533 Following a unit of PRBC, hemoglobin up from 6.4 on 4/4 gradually up to 8.3. Follow CBC periodically.   Gout Allopurinol 300 mg p.o. daily   Hx temporal arteritis Follows with Rheumatology outpatient, Dr. Dimple Casey.  Currently on Actemra every 4 weeks.  Titrated off of prednisone. Outpatient follow-up with Dr. Dimple Casey   Left foot wound, POA PVD/PAD s/p left fifth toe amputation Recent left fifth digit amputation by vascular surgery on 03/22/2023.  Patient with noted to have full-thickness wound to left dorsal foot, fifth digit, left medial foot.  Seen by wound RN and continue wound care as per their recommendations as follows: Cleanse L heel, L dorsal foot, L 5th digit, L medial foot and L great toe with soap and water, dry and apply Xeroform gauze to wound beds daily. Cover with dry gauze and Kerlix roll gauze. Place L foot in Prevalon boot to offload pressure Examined bilateral lower extremity wounds in detail with patient's nurse on 4/6 and they appeared clean without acute findings.   Constipation Resolved with initiation of bowel regimen, continue.   Weakness/debility/deconditioning Recent fall T12 fracture Therapies evaluated and recommend SNF placement.  TOC working on same.    Consultants:  PCCM: Signed off 4/4 Cardiology   Procedures:  None   Antimicrobials:  Completed antibiotic course on 4/6   Discharge Instructions  Discharge Instructions     (HEART FAILURE PATIENTS) Call MD:  Anytime you have  any of the following symptoms: 1) 3 pound weight gain in 24 hours or 5 pounds in 1 week 2) shortness of breath, with or without a dry hacking cough 3) swelling in the hands, feet or stomach 4) if you have to sleep on extra pillows at night in order to breathe.   Complete by: As directed    Call MD for:  difficulty breathing, headache or visual disturbances   Complete by: As directed    Call MD for:  extreme fatigue   Complete by: As directed    Call MD for:  persistant dizziness or light-headedness   Complete by: As directed    Call MD for:  persistant nausea and vomiting   Complete by: As directed    Call MD for:  redness, tenderness, or signs of infection (pain, swelling, redness, odor or green/yellow discharge around incision site)   Complete by: As directed    Call MD for:  severe uncontrolled pain   Complete by: As directed    Call MD for:  temperature >100.4   Complete by: As directed    Diet - low sodium heart healthy   Complete by: As directed    Diet Carb Modified   Complete by: As directed    Discharge wound care:   Complete by: As directed    Wound care  Daily      Comments: Cleanse L heel, L dorsal foot, L 5th digit, L medial foot and L great toe with soap and water, dry and apply Xeroform gauze [place] [phone]) to wound beds daily. Cover with dry gauze and Kerlix roll gauze. Place L foot in Prevalon boot to offload pressure [facility] [place] [phone]).   Increase activity slowly   Complete by: As directed         Medication List     STOP taking these medications    aspirin EC 81 MG tablet   doxazosin 2 MG tablet Commonly known as: CARDURA   Fiasp FlexTouch 100 UNIT/ML FlexTouch Pen Generic drug: insulin aspart   furosemide 40 MG tablet Commonly known as: LASIX   glipiZIDE 5 MG 24 hr tablet Commonly known as: glipiZIDE XL   HYDROcodone-acetaminophen 5-325 MG tablet Commonly known as: NORCO/VICODIN   rosuvastatin 40 MG tablet Commonly known as: Crestor    spironolactone 25 MG tablet Commonly known as: ALDACTONE   Tylenol Sinus Severe 5-325-200 MG Tabs Generic drug: Phenylephrine-APAP-guaiFENesin       TAKE these medications    acetaminophen 325 MG tablet Commonly known as: TYLENOL Take 2 tablets (650 mg total) by mouth every 6 (six) hours as needed for mild pain (pain score 1-3) (or Fever >/= 101).   ACTEMRA IV Inject 6 mg/kg into the vein every 28 (twenty-eight) days.   allopurinol 300 MG tablet Commonly known as: ZYLOPRIM TAKE 1 TABLET BY MOUTH DAILY What changed: when to take this   amiodarone 200 MG tablet Commonly known as: PACERONE Take 1 tablet (200 mg total) by mouth every morning. What changed:  how much to take how to take this when to take this additional instructions   ascorbic acid 250 MG tablet Commonly known as: VITAMIN C Take 1 tablet (250 mg total) by mouth 2 (two) times daily.   atorvastatin 40 MG tablet Commonly known as: LIPITOR Take 1 tablet (40 mg total) by mouth daily. Start taking on: April 20, 2023   bisacodyl 10 MG suppository Commonly known as: DULCOLAX Place 1 suppository (10 mg total) rectally daily as needed for moderate constipation.   cyanocobalamin 500 MCG tablet Commonly known as: VITAMIN B12 Take 1 tablet (500 mcg total) by mouth daily. Start taking on: April 20, 2023   digoxin 0.125 MG tablet Commonly known as: LANOXIN Take 1 tablet (0.125 mg total) by mouth daily. Start taking on: April 20, 2023   Eliquis 5 MG Tabs tablet Generic drug: apixaban TAKE 1 TABLET BY MOUTH 2 TIMES A DAY   feeding supplement Liqd Take 237 mLs by mouth 3 (three) times daily between meals.   fludrocortisone 0.1 MG tablet Commonly known as: FLORINEF Take 2 tablets (0.2 mg total) by mouth 2 (two) times daily.   insulin aspart 100 UNIT/ML injection Commonly known as: novoLOG Inject 0-9 Units into the skin 3 (three) times daily with meals. 0-9  Units, Subcutaneous, 3 times daily with meals,  First dose on Wed 04/13/23 at 0800 Correction coverage: Sensitive (thin, NPO, renal) CBG < 70: Implement Hypoglycemia protocol, CBG 70 - 120: 0 units CBG 121 - 150: 1 unit CBG 151 - 200: 2 units CBG 201 - 250: 3 units CBG 251 - 300: 5 units CBG 301 - 350: 7 units CBG 351 - 400: 9 units CBG > 400: call MD.   Jardiance 10 MG Tabs tablet Generic drug: empagliflozin Take 1 tablet (10 mg total) by mouth daily. What changed: when to take this   metoprolol tartrate 50 MG tablet Commonly known as: LOPRESSOR Take 1.5 tablets (75 mg total) by mouth 2 (two) times daily. What changed: See the new instructions.   midodrine 5 MG tablet Commonly known as: PROAMATINE Take 3 tablets (15 mg total) by mouth 3 (three) times daily with meals.   mirtazapine 30 MG tablet Commonly known as: REMERON TAKE 1 TABLET BY MOUTH EVERY NIGHT AT BEDTIME FOR APPETITE What changed: See the new instructions.   multivitamin with minerals Tabs tablet Take 1 tablet by mouth daily. Start taking on: April 20, 2023   pantoprazole 40 MG tablet Commonly known as: PROTONIX TAKE 1 TABLET BY MOUTH DAILY What changed: when to take this   polyethylene glycol 17 g packet Commonly known as: MIRALAX / GLYCOLAX Take 17 g by mouth daily.   senna-docusate 8.6-50 MG tablet Commonly known as: Senokot-S Take 1 tablet by mouth at bedtime.   thiamine 100 MG tablet Commonly known as: Vitamin B-1 Take 1 tablet (100 mg total) by mouth daily. Start taking on: April 20, 2023   vitamin A 3 MG (10000 UNITS) capsule Take 1 capsule (10,000 Units total) by mouth daily. Start taking on: April 20, 2023   vitamin D3 25 MCG tablet Commonly known as: CHOLECALCIFEROL Take 1 tablet (1,000 Units total) by mouth daily. Start taking on: April 20, 2023   zinc sulfate (50mg  elemental zinc) 220 (50 Zn) MG capsule Take 1 capsule (220 mg total) by mouth daily. Start taking on: April 20, 2023       No Known Allergies    Procedures/Studies: DG  Abd Portable 1V Result Date: 04/17/2023 CLINICAL DATA:  161096 Constipation by delayed colonic transit [id number] EXAM: PORTABLE ABDOMEN - 1 VIEW COMPARISON:  CT 08/19/2022 FINDINGS: Gas throughout nondistended bowel. No bowel obstruction or free air. Multiple calcified gallstones. No acute bony abnormality. IMPRESSION: No evidence of bowel obstruction or free air. Cholelithiasis. Electronically Signed   By: Charlett Nose M.D.   On: 04/17/2023 12:09   ECHOCARDIOGRAM LIMITED Result Date: 04/14/2023    ECHOCARDIOGRAM LIMITED REPORT   Patient Name:   LAKOTA MARKGRAF Date of Exam: 04/14/2023 Medical Rec #:  [id number]      Height:       68.0 in Accession #:    [id number]     Weight:       140.9 lb Date of Birth:  1940-01-29      BSA:          1.761 m Patient Age:    82 years       BP:           113/77 mmHg Patient Gender: M              HR:           112 bpm. Exam Location:  Inpatient Procedure: Limited Echo Indications:    Shock  History:  Patient has prior history of Echocardiogram examinations, most                 recent 12/07/2022. Cardiomyopathy, CAD and Previous Myocardial                 Infarction; COPD.  Sonographer:    Darlys Gales Referring Phys: [id number] Vinnie Level SMITH IMPRESSIONS  1. Limited echo for LVEF  2. Left ventricular ejection fraction, by estimation, is 25 to 30%. Left ventricular ejection fraction by PLAX is 30 %. The left ventricle has severely decreased function. The left ventricle demonstrates global hypokinesis.  3. Left atrial size was moderately dilated. Comparison(s): Changes from prior study are noted. 12/07/2022: LVEF 25%. FINDINGS  Left Ventricle: Left ventricular ejection fraction, by estimation, is 25 to 30%. Left ventricular ejection fraction by PLAX is 30 %. The left ventricle has severely decreased function. The left ventricle demonstrates global hypokinesis. There is no left  ventricular hypertrophy. Left Atrium: Left atrial size was moderately dilated. LEFT VENTRICLE PLAX 2D LV EF:          Left ventricular ejection fraction by PLAX is 30 %. LVIDd:         5.00 cm LVIDs:         4.30 cm LV PW:         0.70 cm LV IVS:        0.90 cm  LEFT ATRIUM             Index LA Vol (A2C):   43.6 ml 24.76 ml/m LA Vol (A4C):   64.7 ml 36.74 ml/m LA Biplane Vol: 54.8 ml 31.12 ml/m Zoila Shutter MD Electronically signed by Zoila Shutter MD Signature Date/Time: 04/14/2023/5:07:17 PM    Final    DG Chest Port 1 View Result Date: 04/14/2023 CLINICAL DATA:  Status post PICC placement. EXAM: PORTABLE CHEST 1 VIEW COMPARISON:  April 13, 2023. FINDINGS: Stable cardiomegaly. Status post coronary bypass graft. Interval placement of left-sided PICC line with distal tip in expected position of cavoatrial junction. Right lung is clear. Mild central pulmonary vascular congestion is noted. Left basilar atelectasis is noted with possible small pleural effusion. IMPRESSION: Interval placement of left-sided PICC line with distal tip in expected position of cavoatrial junction. Mild central pulmonary vascular congestion. Stable left basilar atelectasis with small left pleural effusion. Electronically Signed   By: Lupita Raider M.D.   On: 04/14/2023 13:59   [place] EKG SITE RITE Result Date: 04/14/2023 If Site Rite image not attached, placement could not be confirmed due to current cardiac rhythm.  DG Chest Port 1 View Result Date: 04/13/2023 CLINICAL DATA:  CHF EXAM: PORTABLE CHEST 1 VIEW COMPARISON:  X-ray 04/12/2023 FINDINGS: Hyperinflation. Large cardiopericardial silhouette. Sternal wires. Vascular congestion. Persistent lung base opacities with a left-sided effusion. Opacities more left than right and slightly increased bilaterally. No pneumothorax. Overlapping cardiac leads. Film is rotated. IMPRESSION: Postop chest. Increasing lung base opacity. Component of effusion on the left. Recommend follow-up Electronically Signed   By: Karen Kays M.D.   On: 04/13/2023 18:21   MR HEEL LEFT WO CONTRAST Result Date:  04/13/2023 CLINICAL DATA:  Left heel pain. Recent history of left fifth toe amputation. EXAM: MR OF THE LEFT HEEL WITHOUT CONTRAST TECHNIQUE: Multiplanar, multisequence MR imaging of the left hindfoot was performed. No intravenous contrast was administered. COMPARISON:  Left foot radiographs dated 04/12/2023. MRI of the left foot dated 12/28/2022. FINDINGS: Bones/Joint/Cartilage No marrow signal abnormality to suggest osteomyelitis. No acute fracture  or dislocation. Normal alignment. No joint effusion. Mild joint space narrowing, subchondral cystic change, and marginal spurring at the talonavicular articulation. Mild degenerative changes of the midfoot. Calcaneal enthesopathy at the insertion of the Achilles tendon and the origin of the central cord of the plantar fascia. Ligaments Lisfranc ligament is intact. Muscles and Tendons Flexor, peroneal and extensor compartment tendons are intact. Mild tenosynovitis of the common peroneal tendon sheath. Mild tenosynovitis of the posterior tibialis tendon at the retromalleolar level. There is diffuse pronounced edema of the intrinsic musculature of the foot. No discrete intramuscular collection identified. Thickening and increased signal of the distal Achilles tendon. Soft tissue There is diffuse nonspecific subcutaneous edema of the visualized left lower extremity extending into the foot. Focal area of soft tissue irregularity, which may correspond to a wound, along the posterior heel, overlying the calcaneal insertion of the distal Achilles tendon. No discrete loculated fluid collection identified. IMPRESSION: 1. No evidence of osteomyelitis. 2. Diffuse pronounced nonspecific edema of the intrinsic musculature of the foot could reflect infectious myositis or additional myopathy. No intramuscular collection identified. 3. Focal area of soft tissue irregularity, which may correspond to a wound, along the posterior heel. Diffuse nonspecific subcutaneous edema of the  visualized left lower extremity extending into the foot, cellulitis can not be excluded. No loculated fluid collection identified. 4. Mild tenosynovitis of the posterior tibialis tendon and common peroneal tendon sheath. 5. Chronic plantar fasciitis. 6. Distal Achilles tendinosis. 7. Mild degenerative changes of the midfoot. Electronically Signed   By: Hart Robinsons M.D.   On: 04/13/2023 09:39   DG Foot Complete Left Result Date: 04/12/2023 CLINICAL DATA:  sepsis, foot infection. EXAM: LEFT FOOT - COMPLETE 3+ VIEW COMPARISON:  02/08/2023. FINDINGS: There is diffuse osteopenia of the visualized osseous structures. Since the prior study, patient underwent amputation of fifth toe, with resection at the level of middle third of the proximal phalanx. Resection margin is sharp. No focal bone erosions. No acute fracture or dislocation. No aggressive osseous lesion. Mild diffuse degenerative changes of imaged joints. Calcaneal spur noted along the Achilles tendon and Plantar aponeurosis attachment sites. No focal soft tissue swelling. No radiopaque foreign bodies. IMPRESSION: No acute osseous abnormality of the left foot. Electronically Signed   By: Jules Schick M.D.   On: 04/12/2023 13:35   DG Chest Port 1 View Result Date: 04/12/2023 CLINICAL DATA:  weakness, foot redness, sepsis. EXAM: PORTABLE CHEST 1 VIEW COMPARISON:  11/24/2018. FINDINGS: Low lung volume. There are probable atelectatic changes at the lung bases. Bilateral lung fields are otherwise clear. No dense consolidation or lung collapse. No pulmonary edema. Bilateral costophrenic angles are clear. Mildly enlarged cardio-mediastinal silhouette. There are surgical staples along the heart border and sternotomy wires, status post CABG (coronary artery bypass graft). No acute osseous abnormalities. The soft tissues are within normal limits. IMPRESSION: No active disease. Electronically Signed   By: Jules Schick M.D.   On: 04/12/2023 13:34   DG Lumbar  Spine Complete Result Date: 04/02/2023 CLINICAL DATA:  Low back pain after fall. EXAM: LUMBAR SPINE - COMPLETE 4+ VIEW COMPARISON:  CT abdomen and pelvis from 08/19/2022. FINDINGS: Minimal grade 1 retrolisthesis of L4 on L5. New but age indeterminate compression fracture of T12 results in roughly 60 percent loss of vertebral body height. Multilevel disc space narrowing is unchanged significantly and moderate at L5-S1. There is vacuum phenomenon at L5-S1. Osteopenia. Intact pedicles. Numerous calculi in the right upper quadrant likely from cholelithiasis. IMPRESSION: New but age indeterminate compression  fracture of T12 results in roughly 60 percent loss of vertebral body height. Correlate with point tenderness and consider MRI. Electronically Signed   By: Ala Bent M.D.   On: 04/02/2023 09:29      Subjective: "Feel good". Denies complaints. Told me that he saw Dr. Rennis Golden earlier this morning. Last BM yesterday. No chest pain, palpitations, dyspnea or dizziness. As per RN report, no acute issues.   Discharge Exam:  Vitals:   04/19/23 0352 04/19/23 0500 04/19/23 1000 04/19/23 1207  BP: (!) 103/45  (!) 104/49 (!) 102/48  Pulse: 88  82 86  Resp: 18 (!) 21 19 (!) 22  Temp: 98.3 F (36.8 C)  98.3 F (36.8 C) 97.9 F (36.6 C)  TempSrc: Oral  Oral Oral  SpO2:   96% 97%  Weight:  64.1 kg    Height:        General exam: Elderly male, moderately built and frail, chronically ill looking sitting up comfortably in reclining chair. Respiratory system: Clear to auscultation. Respiratory effort normal. Cardiovascular system: S1 & S2 heard, irregularly irregular and mildly tachycardic. No JVD, murmurs, rubs, gallops or clicks. No pedal edema.  Telemetry personally reviewed: A-fib with BBB morphology, rate controlled in the 70s-80s. Gastrointestinal system: Abdomen is nondistended, soft and nontender. No organomegaly or masses felt. Normal bowel sounds heard. Central nervous system: Alert and  oriented. No focal neurological deficits. Extremities: Symmetric 5 x 5 power.  Bilateral lower extremity dressings of multiple wounds were removed and examined on 4/6.  Wounds clean without acute findings.  Currently dressings clean, dry and intact. Skin: As above Psychiatry: Judgement and insight appear normal. Mood & affect appropriate.   Pictures below from 04/13/2023                      The results of significant diagnostics from this hospitalization (including imaging, microbiology, ancillary and laboratory) are listed below for reference.     Microbiology: Recent Results (from the past 240 hours)  Resp panel by RT-PCR (RSV, Flu A&B, Covid) Anterior Nasal Swab     Status: Abnormal   Collection Time: 04/12/23  9:58 AM   Specimen: Anterior Nasal Swab  Result Value Ref Range Status   SARS Coronavirus 2 by RT PCR POSITIVE (A) NEGATIVE Final   Influenza A by PCR NEGATIVE NEGATIVE Final   Influenza B by PCR NEGATIVE NEGATIVE Final    Comment: (NOTE) The Xpert Xpress SARS-CoV-2/FLU/RSV plus assay is intended as an aid in the diagnosis of influenza from Nasopharyngeal swab specimens and should not be used as a sole basis for treatment. Nasal washings and aspirates are unacceptable for Xpert Xpress SARS-CoV-2/FLU/RSV testing.  Fact Sheet for Patients: BloggerCourse.com  Fact Sheet for Healthcare Providers: SeriousBroker.it  This test is not yet approved or cleared by the [place] FDA and has been authorized for detection and/or diagnosis of SARS-CoV-2 by FDA under an Emergency Use Authorization (EUA). This EUA will remain in effect (meaning this test can be used) for the duration of the COVID-19 declaration under Section 564(b)(1) of the Act, 21 U.S.C. section 360bbb-3(b)(1), unless the authorization is terminated or revoked.     Resp Syncytial Virus by PCR NEGATIVE NEGATIVE Final    Comment: (NOTE) Fact  Sheet for Patients: BloggerCourse.com  Fact Sheet for Healthcare Providers: SeriousBroker.it  This test is not yet approved or cleared by the [place] FDA and has been authorized for detection and/or diagnosis of SARS-CoV-2 by FDA under an Emergency Use  Authorization (EUA). This EUA will remain in effect (meaning this test can be used) for the duration of the COVID-19 declaration under Section 564(b)(1) of the Act, 21 U.S.C. section 360bbb-3(b)(1), unless the authorization is terminated or revoked.  Performed at [facility], [address], [place], [place] [address]   Blood Culture (routine x 2)     Status: None   Collection Time: 04/12/23 10:40 AM   Specimen: BLOOD  Result Value Ref Range Status   Specimen Description BLOOD LEFT ANTECUBITAL  Final   Special Requests   Final    BOTTLES DRAWN AEROBIC AND ANAEROBIC Blood Culture results may not be optimal due to an inadequate volume of blood received in culture bottles   Culture   Final    NO GROWTH 5 DAYS Performed at [facility], [address], [place], [place] [address]    Report Status 04/17/2023 FINAL  Final  Blood Culture (routine x 2)     Status: None   Collection Time: 04/12/23 10:40 AM   Specimen: BLOOD  Result Value Ref Range Status   Specimen Description BLOOD RIGHT ANTECUBITAL  Final   Special Requests   Final    BOTTLES DRAWN AEROBIC AND ANAEROBIC Blood Culture results may not be optimal due to an inadequate volume of blood received in culture bottles   Culture   Final    NO GROWTH 5 DAYS Performed at [facility], [address] [place], [place] [address]    Report Status 04/17/2023 FINAL  Final  MRSA Next Gen by PCR, Nasal     Status: None   Collection Time: 04/13/23  1:53 AM   Specimen: Nasal Mucosa; Nasal Swab  Result Value Ref Range Status   MRSA by PCR Next Gen NOT DETECTED NOT DETECTED Final    Comment: (NOTE) The GeneXpert  MRSA Assay (FDA approved for NASAL specimens only), is one component of a comprehensive MRSA colonization surveillance program. It is not intended to diagnose MRSA infection nor to guide or monitor treatment for MRSA infections. Test performance is not FDA approved in patients less than 31 years old. Performed at [facility], [address], [place], [place] [address]      Labs: CBC: Recent Labs  Lab 04/13/23 [phone] 04/15/23 0400 04/16/23 0419 04/17/23 0500 04/18/23 0355  WBC 8.8 7.3 8.8 9.8 9.5  HGB 7.5* 6.4* 7.9* 8.1* 8.3*  HCT 24.0* 20.3* 24.4* 25.5* 26.2*  MCV 101.7* 100.5* 97.2 97.0 99.6  PLT 167 171 190 232 263    Basic Metabolic Panel: Recent Labs  Lab 04/14/23 1410 04/15/23 0400 04/16/23 0419 04/17/23 0500 04/18/23 0355 04/19/23 1055  NA 134* 136 139 138 137  --   K 4.0 3.6 3.8 4.0 3.9  --   CL 105 107 109 107 107  --   CO2 21* 22 22 23 23   --   GLUCOSE 100* 143* 125* 119* 110*  --   BUN 23 22 19 18 16   --   CREATININE 1.42* 1.17 1.14 0.98 1.15  --   CALCIUM 8.5* 8.5* 8.8* 8.5* 8.4*  --   MG  --  1.9 2.2 2.2 2.0  --   PHOS  --  1.9* 1.9* 2.8 2.3* 3.0    Liver Function Tests: Recent Labs  Lab 04/13/23 0319  AST 52*  ALT 27  ALKPHOS 99  BILITOT 0.9  PROT 5.1*  ALBUMIN 2.3*    CBG: Recent Labs  Lab 04/18/23 1123  04/18/23 1741 04/18/23 2116 04/19/23 0618 04/19/23 1209  GLUCAP 116* 155* 166* 101* 126*     Urinalysis    Component Value Date/Time   COLORURINE AMBER (A) 04/13/2023 0053   APPEARANCEUR CLOUDY (A) 04/13/2023 0053   LABSPEC 1.015 04/13/2023 0053   PHURINE 5.0 04/13/2023 0053   GLUCOSEU NEGATIVE 04/13/2023 0053   HGBUR LARGE (A) 04/13/2023 0053   BILIRUBINUR NEGATIVE 04/13/2023 0053   KETONESUR NEGATIVE 04/13/2023 0053   PROTEINUR 30 (A) 04/13/2023 0053   UROBILINOGEN 0.2 05/06/2013 2202   NITRITE NEGATIVE 04/13/2023 0053   LEUKOCYTESUR NEGATIVE 04/13/2023 0053    I discussed with patient's spouse via phone,  updated care and answered questions.  Time coordinating discharge: 45 minutes  SIGNED:  Marcellus Scott, MD,  FACP, [facility], [facility], [facility]   [facility] [place]     To contact the attending provider between 7A-7P or the covering provider during after hours 7P-7A, please log into the web site [email or web address] and access using universal [place] password for that web site. If you do not have the password, please call the hospital operator.

## 2023-04-19 NOTE — Progress Notes (Signed)
 PROGRESS NOTE    Donald Rios  OZD:[id number] DOB: 1940-04-26 DOA: 04/12/2023 PCP: [name], MD    Brief Narrative:   Donald Rios is a 83 y.o. 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  On EMS arrival, patient was altered and noted to have a CBG of 49; D10 was administered with improvement of patient's glucose and mentation.    In the ED, temperature 97.5 F, BP 80s-110 systolic, heart rate 100s-130s, RR teens-20s, on room air.  Sodium 134, CO2 19, BUN 34, creatinine 2.65, glucose 199, calcium 8.8, protein 6.1, AST 66, alkaline phosphatase 139.  WBC 17.2, hemoglobin 9.9, INR 1.7.  Lactic acid 4.6> 2.7.  COVID-19 positive.  Chest x-ray without acute abnormality.  Left foot x-ray without acute abnormality.  Blood cultures and urinalysis ordered.  Patient received vancomycin, cefepime, Flagyl in the ED and 2 L IV fluid bolus.  TRH consulted for admission for concerns of sepsis secondary to left lower extremity cellulitis with recent left fifth toe amputation.  Significant hospital events: 4/1: admit to [facility] broad abx c/f cellulitis L foot  4/2: PCCM consult for low NIBP readings, txf to ICU  4/3: BP still low normal. getting a PICC  4/4: transfer back to [facility]  Assessment & Plan:   Sepsis, POA Left lower extremity cellulitis Patient p/w redness to his left foot concerning for cellulitis in the setting of recent amputation of his fifth left digit.   MR left heel without contrast with no evidence of osteomyelitis, had some cellulitis features.   Initially started on empiric antibiotics with vancomycin, cefepime, metronidazole and then de-escalated  to doxycycline IV Cefepime/Flagyl 4/1>4/3, PO Doxy 4/3> 4/6, completed course. Leukocytosis resolved.  Blood cultures x 2 negative to date. Sepsis and lower extremity cellulitis resolved.  Multiple wounds of bilateral lower extremities are clean without acute findings. Continue diligent wound care at SNF.  Covid-19 viral infection without pneumonia Chest x-ray with no acute cardiopulmonary disease process. Continue airborne/contact isolation precautions  Hypotension Hx Chronic systolic congestive heart failure, compensated Hx Essential hypertension Hx Paroxysmal atrial fibrillation, now with RVR/mild At baseline on metoprolol tartrate 75 mg p.o. twice daily, furosemide 40 mg p.o. twice daily, spironolactone 25 mg p.o. twice daily, amiodarone 200 mg p.o. daily. Patient with hypotension during hospitalization requiring transfer to the ICU.  Likely secondary to sepsis Vs adrenal insufficiency given previous history of longstanding use of steroids which were tapered off by rheumatology with history of temporal arteritis.  Cortisol level within normal limits. -- Holding home furosemide, spironolactone -- s/p albumin 25 g IV every 6 hours x 4 doses -- Started on fludrocortisone 0.2 mg p.o. twice daily by PCCM. Midodrine 15 mg p.o. 3 times daily.  Ongoing soft blood pressures with several SBP's in the 100 range. -- Continue amiodarone 200 mg p.o. daily -- Eliquis was briefly held due to anemia in the absence of overt bleed and subsequently resumed.   A-fib with mild RVR in the 100s-110s, resumed prior home dose of metoprolol 75 Mg twice daily with improved control in the 90s-100s.  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 Cardiology signed off appreciated.  Have started low-dose digoxin 0.125 Mg daily.  As per Cardiology recommendations, nursing facility to draw Digoxin level on Monday 4/14  or Tuesday 4/15 in the morning before digoxin dose for that day, requested by Dr. Rennis Golden, with request to call result to [phone] and fax copy to [phone].  Has outpatient follow-up arranged in 3 weeks.  CAD s/p CABG Crestor 40 g p.o. daily  Acute metabolic encephalopathy/hypoglycemia: resolved DM2 Hemoglobin A1c 6.2 on 02/08/2023, well-controlled.  At baseline on Jardiance, glipizide 5 mg p.o. daily, and insulin aspart sliding scale.  On EMS arrival, patient was noted to be hypoglycemic and received D10. Stable inpatient control.  COPD Stable without clinical bronchospasm.  On room air without hypoxia.  GERD Protonix 40 mg p.o. daily  Vitamin D deficiency Vitamin D 25-hydroxy level low at 20.65. started on Cholecalciferol 1000 units PO daily.  Follow levels as outpatient.  Acute on chronic anemia Anemia panel with iron 21, ferritin 1603, folate 7.1, vitamin B12 533 Following a unit of PRBC, hemoglobin up from 6.4 on 4/4 gradually up to 8.3. Follow CBC periodically.  Gout Allopurinol 300 mg p.o. daily  Hx temporal arteritis Follows with Rheumatology outpatient, Dr. Dimple Casey.  Currently on Actemra every 4 weeks.  Titrated off of prednisone.  Left foot wound, POA PVD/PAD s/p left fifth toe amputation Recent left fifth digit amputation by vascular surgery on 03/22/2023.  Patient with noted to have full-thickness wound to left dorsal foot, fifth digit, left medial foot.  Seen by wound RN and continue wound care as per their recommendations as follows: Cleanse L heel, L dorsal foot, L 5th digit, L medial foot and L great toe with soap and water, dry and apply Xeroform gauze to wound beds daily. Cover with dry gauze and Kerlix roll gauze. Place L foot in Prevalon boot to offload pressure Examined bilateral lower extremity wounds in detail with patient's nurse on 4/6 and they appeared clean without acute findings.  Constipation Resolved with initiation of bowel regimen,  continue.  Multiple vitamin deficiencies Management as per registered dietitian, have initiated vitamin D and B12 supplements.  Weakness/debility/deconditioning Recent fall T12 fracture Therapies evaluated and recommend SNF placement.  TOC working on same.  DVT prophylaxis: apixaban (ELIQUIS) tablet 5 mg    Code Status: Full Code Family Communication: No family at bedside.  Disposition Plan:  Level of care: Progressive Status is: Inpatient Medically ready for DC to SNF pending bed.    Consultants:  PCCM: Signed off 4/4 Cardiology  Procedures:  None  Antimicrobials:  Completed antibiotic course on 4/6   Subjective: "Feel good".  Denies complaints.  Told me that he saw Dr. Rennis Golden earlier this morning.  Last BM yesterday.  No chest pain, palpitations, dyspnea or dizziness.  As per RN report, no acute issues.  Objective: Vitals:   04/19/23 0352 04/19/23 0500 04/19/23 1000 04/19/23 1207  BP: (!) 103/45  (!) 104/49 (!) 102/48  Pulse: 88  82 86  Resp: 18 (!) 21 19 (!) 22  Temp: 98.3 F (36.8 C)  98.3 F (36.8 C) 97.9 F (36.6 C)  TempSrc: Oral  Oral Oral  SpO2:   96% 97%  Weight:  64.1 kg    Height:        Intake/Output Summary (Last 24 hours) at 04/19/2023 1343 Last data filed at 04/19/2023 1000 Gross per 24 hour  Intake --  Output 850 ml  Net -850 ml    Filed Weights   04/17/23  0500 04/18/23 0245 04/19/23 0500  Weight: 66 kg 64.1 kg 64.1 kg    Examination:  General exam: Elderly male, moderately built and frail, chronically ill looking sitting up comfortably in reclining chair. Respiratory system: Clear to auscultation. Respiratory effort normal. Cardiovascular system: S1 & S2 heard, irregularly irregular and mildly tachycardic. No JVD, murmurs, rubs, gallops or clicks. No pedal edema.  Telemetry personally reviewed: A-fib with BBB morphology, rate controlled in the 70s-80s. Gastrointestinal system: Abdomen is nondistended, soft and nontender. No organomegaly  or masses felt. Normal bowel sounds heard. Central nervous system: Alert and oriented. No focal neurological deficits. Extremities: Symmetric 5 x 5 power.  Bilateral lower extremity dressings of multiple wounds were removed and examined on 4/6.  Wounds clean without acute findings.  Currently dressings clean, dry and intact. Skin: As above Psychiatry: Judgement and insight appear normal. Mood & affect appropriate.               Data Reviewed: I have personally reviewed following labs and imaging studies  CBC: Recent Labs  Lab 04/13/23 0808 04/15/23 0400 04/16/23 0419 04/17/23 0500 04/18/23 0355  WBC 8.8 7.3 8.8 9.8 9.5  HGB 7.5* 6.4* 7.9* 8.1* 8.3*  HCT 24.0* 20.3* 24.4* 25.5* 26.2*  MCV 101.7* 100.5* 97.2 97.0 99.6  PLT 167 171 190 232 263   Basic Metabolic Panel: Recent Labs  Lab 04/14/23 1410 04/15/23 0400 04/16/23 0419 04/17/23 0500 04/18/23 0355 04/19/23 1055  NA 134* 136 139 138 137  --   K 4.0 3.6 3.8 4.0 3.9  --   CL 105 107 109 107 107  --   CO2 21* 22 22 23 23   --   GLUCOSE 100* 143* 125* 119* 110*  --   BUN 23 22 19 18 16   --   CREATININE 1.42* 1.17 1.14 0.98 1.15  --   CALCIUM 8.5* 8.5* 8.8* 8.5* 8.4*  --   MG  --  1.9 2.2 2.2 2.0  --   PHOS  --  1.9* 1.9* 2.8 2.3* 3.0   GFR: Estimated Creatinine Clearance: 45.7 mL/min (by C-G formula based on SCr of 1.15 mg/dL). Liver Function Tests: Recent Labs  Lab 04/13/23 0319  AST 52*  ALT 27  ALKPHOS 99  BILITOT 0.9  PROT 5.1*  ALBUMIN 2.3*    Coagulation Profile: Recent Labs  Lab 04/13/23 0319  INR 1.6*    CBG: Recent Labs  Lab 04/18/23 1123 04/18/23 1741 04/18/23 2116 04/19/23 0618 04/19/23 1209  GLUCAP 116* 155* 166* 101* 126*     Recent Results (from the past 240 hours)  Resp panel by RT-PCR (RSV, Flu A&B, Covid) Anterior Nasal Swab     Status: Abnormal   Collection Time: 04/12/23  9:58 AM   Specimen: Anterior Nasal Swab  Result Value Ref Range Status   SARS Coronavirus  2 by RT PCR POSITIVE (A) NEGATIVE Final   Influenza A by PCR NEGATIVE NEGATIVE Final   Influenza B by PCR NEGATIVE NEGATIVE Final    Comment: (NOTE) The Xpert Xpress SARS-CoV-2/FLU/RSV plus assay is intended as an aid in the diagnosis of influenza from Nasopharyngeal swab specimens and should not be used as a sole basis for treatment. Nasal washings and aspirates are unacceptable for Xpert Xpress SARS-CoV-2/FLU/RSV testing.  Fact Sheet for Patients: BloggerCourse.com  Fact Sheet for Healthcare Providers: SeriousBroker.it  This test is not yet approved or cleared by the [place] FDA and has been authorized for detection and/or diagnosis of SARS-CoV-2 by FDA under  an Emergency Use Authorization (EUA). This EUA will remain in effect (meaning this test can be used) for the duration of the COVID-19 declaration under Section 564(b)(1) of the Act, 21 U.S.C. section 360bbb-3(b)(1), unless the authorization is terminated or revoked.     Resp Syncytial Virus by PCR NEGATIVE NEGATIVE Final    Comment: (NOTE) Fact Sheet for Patients: BloggerCourse.com  Fact Sheet for Healthcare Providers: SeriousBroker.it  This test is not yet approved or cleared by the [place] FDA and has been authorized for detection and/or diagnosis of SARS-CoV-2 by FDA under an Emergency Use Authorization (EUA). This EUA will remain in effect (meaning this test can be used) for the duration of the COVID-19 declaration under Section 564(b)(1) of the Act, 21 U.S.C. section 360bbb-3(b)(1), unless the authorization is terminated or revoked.  Performed at [facility], [address], [place], [place] [address]   Blood Culture (routine x 2)     Status: None   Collection Time: 04/12/23 10:40 AM   Specimen: BLOOD  Result Value Ref Range Status   Specimen Description BLOOD LEFT ANTECUBITAL  Final    Special Requests   Final    BOTTLES DRAWN AEROBIC AND ANAEROBIC Blood Culture results may not be optimal due to an inadequate volume of blood received in culture bottles   Culture   Final    NO GROWTH 5 DAYS Performed at [facility], [address] [place], [place] [address]    Report Status 04/17/2023 FINAL  Final  Blood Culture (routine x 2)     Status: None   Collection Time: 04/12/23 10:40 AM   Specimen: BLOOD  Result Value Ref Range Status   Specimen Description BLOOD RIGHT ANTECUBITAL  Final   Special Requests   Final    BOTTLES DRAWN AEROBIC AND ANAEROBIC Blood Culture results may not be optimal due to an inadequate volume of blood received in culture bottles   Culture   Final    NO GROWTH 5 DAYS Performed at [facility], [address], [place], [place] [address]    Report Status 04/17/2023 FINAL  Final  MRSA Next Gen by PCR, Nasal     Status: None   Collection Time: 04/13/23  1:53 AM   Specimen: Nasal Mucosa; Nasal Swab  Result Value Ref Range Status   MRSA by PCR Next Gen NOT DETECTED NOT DETECTED Final    Comment: (NOTE) The GeneXpert MRSA Assay (FDA approved for NASAL specimens only), is one component of a comprehensive MRSA colonization surveillance program. It is not intended to diagnose MRSA infection nor to guide or monitor treatment for MRSA infections. Test performance is not FDA approved in patients less than 77 years old. Performed at [facility], [address], [place], [place] [address]          Radiology Studies: No results found.        Scheduled Meds:  allopurinol  300 mg Oral Daily   amiodarone  200 mg Oral q AM   apixaban  5 mg Oral BID   ascorbic acid  250 mg Oral BID   atorvastatin  40 mg Oral Daily   Chlorhexidine Gluconate Cloth  6 each Topical Daily   cholecalciferol  1,000 Units Oral Daily   vitamin B-12  500 mcg Oral Daily   digoxin  0.125 mg Oral Daily   feeding supplement  237 mL Oral TID BM    fludrocortisone  0.2 mg Oral BID  insulin aspart  0-9 Units Subcutaneous TID WC   metoprolol tartrate  75 mg Oral BID   midodrine  15 mg Oral TID WC   mirtazapine  30 mg Oral QHS   multivitamin with minerals  1 tablet Oral Daily   pantoprazole  40 mg Oral Daily   polyethylene glycol  17 g Oral BID   senna-docusate  1 tablet Oral BID   sodium chloride flush  10-40 mL Intracatheter Q12H   sodium chloride flush  3 mL Intravenous Q12H   thiamine  100 mg Oral Daily   vitamin A  10,000 Units Oral Daily   zinc sulfate (50mg  elemental zinc)  220 mg Oral Daily   Continuous Infusions:     LOS: 6 days   Marcellus Scott, MD,  FACP, [facility], [facility], [facility]   [facility] [place]     To contact the attending provider between 7A-7P or the covering provider during after hours 7P-7A, please log into the web site [email or web address] and access using universal  password for that web site. If you do not have the password, please call the hospital operator.

## 2023-04-19 NOTE — Progress Notes (Signed)
 Physical Therapy Treatment Patient Details Name: Donald Rios MRN: [id number] DOB: October 06, 1940 Today's Date: 04/19/2023   History of Present Illness 83 y.o. male presents to [facility] hospital on 04/12/2023 with AMS and facial droop, found to be hypoglycemic. Pt with recent L fifth toe amputation (03/22/2023), concern for cellulitis. Pt also with recent fall and T12 fx. Pt also COVID positive. 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    PT Comments  Pt resting in bed on arrival and agreeable to session with slow but steady progress towards acute goals. Pt continues to be limited by LLE pain, general weakness, decreased activity tolerance and impaired balance/postural reactions. Pt requiring grossly min A to complete bed mobility and mod A to boost to stand from standard height surface. Pt able to pivot to chair with max A to maintain balance with max multimodal cues for weight bearing precautions. Patient will benefit from continued inpatient follow up therapy, <3 hours/day, will continue to follow acutely.     If plan is discharge home, recommend the following: A lot of help with walking and/or transfers;A lot of help with bathing/dressing/bathroom;Assistance with cooking/housework;Assist for transportation;Help with stairs or ramp for entrance   Can travel by private vehicle     No  Equipment Recommendations  None recommended by PT    Recommendations for Other Services       Precautions / Restrictions Precautions Precautions: Fall Recall of Precautions/Restrictions: Intact Precaution/Restrictions Comments: pt with documentation for heel WB in post-op shoe after recent toe amputation in early march, no updates to WB status seen. No post-op shoe present at this time, pt limited to transfers Restrictions Weight Bearing Restrictions Per Provider Order: Yes LLE Weight Bearing  Per Provider Order: Partial weight bearing (heel weightbearing in post-op shoe since L fifth toe amputation on 03/22/2023)     Mobility  Bed Mobility Overal bed mobility: Needs Assistance Bed Mobility: Supine to Sit     Supine to sit: Min assist, HOB elevated     General bed mobility comments: light assist to fully elevate trunk and scoot out to EOB    Transfers Overall transfer level: Needs assistance Equipment used: Rolling walker (2 wheels) Transfers: Sit to/from Stand, Bed to chair/wheelchair/BSC Sit to Stand: Mod assist, Min assist, From elevated surface Stand pivot transfers: Max assist         General transfer comment: min A to boost from elevated EOB, Heavy Mod A to stand from lower recliner surface, max A for cues to maintain heel WBing, max A to steady during pivot to chair    Ambulation/Gait                   Stairs             Wheelchair Mobility     Tilt Bed    Modified Rankin (Stroke Patients Only)       Balance Overall balance assessment: Needs assistance Sitting-balance support: No upper extremity supported, Feet supported Sitting balance-Leahy Scale: Fair     Standing balance support: Bilateral upper extremity supported, Reliant on assistive device for balance Standing balance-Leahy Scale: Poor                              Communication Communication Communication: Impaired Factors Affecting Communication: Hearing impaired  Cognition Arousal: Alert Behavior During Therapy: [facility]  for tasks assessed/performed   PT - Cognitive impairments: No family/caregiver present to determine baseline, Memory, Safety/Judgement                         Following commands: Intact      Cueing Cueing Techniques: Gestural cues, Verbal cues  Exercises      General Comments General comments (skin integrity, edema, etc.): VSS on RA      Pertinent Vitals/Pain Pain Assessment Pain Assessment: Faces Faces Pain Scale:  Hurts a little bit Pain Location: L foot Pain Descriptors / Indicators: Sore Pain Intervention(s): Monitored during session, Limited activity within patient's tolerance    Home Living                          Prior Function            PT Goals (current goals can now be found in the care plan section) Acute Rehab PT Goals Patient Stated Goal: to return to ambulation PT Goal Formulation: With patient Time For Goal Achievement: 04/29/23 Progress towards PT goals: Progressing toward goals    Frequency    Min 2X/week      PT Plan      Co-evaluation              AM-PAC PT "6 Clicks" Mobility   Outcome Measure  Help needed turning from your back to your side while in a flat bed without using bedrails?: A Little Help needed moving from lying on your back to sitting on the side of a flat bed without using bedrails?: A Little Help needed moving to and from a bed to a chair (including a wheelchair)?: A Lot Help needed standing up from a chair using your arms (e.g., wheelchair or bedside chair)?: A Lot Help needed to walk in hospital room?: Total Help needed climbing 3-5 steps with a railing? : Total 6 Click Score: 12    End of Session Equipment Utilized During Treatment: Gait belt Activity Tolerance: Patient tolerated treatment well Patient left: in chair;with call bell/phone within reach;with chair alarm set Nurse Communication: Mobility status;Other (comment) (some blood at great toe site on L) PT Visit Diagnosis: Other abnormalities of gait and mobility (R26.89);Muscle weakness (generalized) (M62.81)     Time: 1610-9604 PT Time Calculation (min) (ACUTE ONLY): 23 min  Charges:    $Therapeutic Activity: 23-37 mins PT General Charges $$ ACUTE PT VISIT: 1 Visit                     Ikhlas Albo R. PTA Acute Rehabilitation Services Office: [phone]   Catalina Antigua 04/19/2023, 12:25 PM

## 2023-04-19 NOTE — TOC Transition Note (Signed)
 Transition of Care [facility]) - Discharge Note   Patient Details  Name: Donald Rios MRN: [id number] Date of Birth: Dec 20, 1940  [facility]) CM/SW Contact:  Eduard Roux, LCSW Phone Number: 04/19/2023, 3:45 PM   Clinical Narrative:     Patient will Discharge to: [facility]  Discharge Date: 04/19/2023 Family Notified: spouse  Transport By: Sharin Mons- RN please call transport once he is ready to transport [phone]  Per MD patient is ready for discharge. RN, patient, and facility notified of discharge. Discharge Summary sent to facility. RN given number for [phone]. Ambulance transport requested for patient.   Clinical Social Worker signing off.  Antony Blackbird, MSW, LCSW Clinical Social Worker     Final next level of care: Skilled Nursing Facility Barriers to Discharge: Barriers Resolved   Patient Goals and CMS Choice Patient states their goals for this hospitalization and ongoing recovery are:: To go to rehab CMS Medicare.gov Compare Post Acute Care list provided to:: Patient Choice offered to / list presented to : Patient [place] ownership interest in [facility].provided to:: Patient    Discharge Placement              Patient chooses bed at: [facility] Patient to be transferred to facility by: [facility]   Patient and family notified of of transfer: 04/19/23  Discharge Plan and Services Additional resources added to the After Visit Summary for                                       Social Drivers of Health (SDOH) Interventions SDOH Screenings   Food Insecurity: No Food Insecurity (04/13/2023)  Housing: Low Risk  (04/13/2023)  Transportation Needs: No Transportation Needs (04/13/2023)  Utilities: Not At Risk (04/12/2023)  Alcohol Screen: Low Risk  (07/22/2022)  Depression (PHQ2-9): Low Risk  (01/07/2023)  Financial Resource Strain: Low Risk  (08/24/2022)  Physical Activity: Sufficiently Active (07/10/2021)  Social  Connections: Moderately Integrated (04/12/2023)  Stress: No Stress Concern Present (08/24/2022)  Tobacco Use: Medium Risk (04/13/2023)  Health Literacy: Adequate Health Literacy (08/24/2022)     Readmission Risk Interventions     No data to display

## 2023-04-19 NOTE — Progress Notes (Signed)
 DAILY PROGRESS NOTE   Patient Name: Donald Rios Date of Encounter: 04/19/2023 Cardiologist: Chrystie Nose, MD  Chief Complaint   No complaints  Patient Profile   Donald Rios is a 83 y.o. 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 Waymon Amato.   Subjective   HR better today, remains in afib. Was not given digoxin yesterday- seems that it was ordered for IV x 1 (was supposed to be daily).  Objective   Vitals:   04/18/23 1128 04/18/23 2225 04/19/23 0352 04/19/23 0500  BP: 109/71 110/63 (!) 103/45   Pulse:  80 88   Resp: 20 17 18  (!) 21  Temp: 98.4 F (36.9 C) 98 F (36.7 C) 98.3 F (36.8 C)   TempSrc: Oral Oral Oral   SpO2:  100%    Weight:    64.1 kg  Height:       No intake or output data in the 24 hours ending 04/19/23 0924 Filed Weights   04/17/23 0500 04/18/23 0245 04/19/23 0500  Weight: 66 kg 64.1 kg 64.1 kg    Physical Exam    General appearance: alert and no distress Lungs: rhonchi bilaterally Heart: irregularly irregular rhythm Extremities: extremities normal, atraumatic, no cyanosis or edema Neurologic: Grossly normal  Inpatient Medications    Scheduled Meds:  allopurinol  300 mg Oral Daily   amiodarone  200 mg Oral q AM   apixaban  5 mg Oral BID   ascorbic acid  250 mg Oral BID   atorvastatin  40 mg Oral Daily   Chlorhexidine Gluconate Cloth  6 each Topical Daily   cholecalciferol  1,000 Units Oral Daily   vitamin B-12  500 mcg Oral Daily   digoxin  0.125 mg Oral Daily   feeding supplement  237 mL Oral TID BM   fludrocortisone  0.2 mg Oral BID   insulin aspart  0-9 Units Subcutaneous TID WC   metoprolol tartrate  75 mg Oral BID   midodrine  15 mg Oral TID WC   mirtazapine  30 mg Oral QHS   multivitamin with minerals   1 tablet Oral Daily   pantoprazole  40 mg Oral Daily   polyethylene glycol  17 g Oral BID   senna-docusate  1 tablet Oral BID   sodium chloride flush  10-40 mL Intracatheter Q12H   sodium chloride flush  3 mL Intravenous Q12H   thiamine  100 mg Oral Daily   vitamin A  10,000 Units Oral Daily   zinc sulfate (50mg  elemental zinc)  220 mg Oral Daily    Continuous Infusions:   PRN Meds: acetaminophen **OR** acetaminophen, bisacodyl, mouth rinse, sodium chloride flush   Labs   Results for orders placed or performed during the hospital encounter of 04/12/23 (from the past 48 hours)  Glucose, capillary     Status: Abnormal   Collection Time: 04/17/23 11:02 AM  Result Value Ref Range   Glucose-Capillary 108 (H) 70 - 99 mg/dL    Comment: Glucose reference range applies only to samples taken after fasting for at least 8 hours.  Glucose, capillary     Status: Abnormal   Collection Time: 04/17/23  4:01 PM  Result Value Ref Range   Glucose-Capillary 129 (H) 70 - 99 mg/dL  Comment: Glucose reference range applies only to samples taken after fasting for at least 8 hours.  Glucose, capillary     Status: None   Collection Time: 04/17/23  9:26 PM  Result Value Ref Range   Glucose-Capillary 77 70 - 99 mg/dL    Comment: Glucose reference range applies only to samples taken after fasting for at least 8 hours.  Basic metabolic panel with GFR     Status: Abnormal   Collection Time: 04/18/23  3:55 AM  Result Value Ref Range   Sodium 137 135 - 145 mmol/L   Potassium 3.9 3.5 - 5.1 mmol/L   Chloride 107 98 - 111 mmol/L   CO2 23 22 - 32 mmol/L   Glucose, Bld 110 (H) 70 - 99 mg/dL    Comment: Glucose reference range applies only to samples taken after fasting for at least 8 hours.   BUN 16 8 - 23 mg/dL   Creatinine, Ser 7.82 0.61 - 1.24 mg/dL   Calcium 8.4 (L) 8.9 - 10.3 mg/dL   GFR, Estimated >95 >62 mL/min    Comment: (NOTE) Calculated using the CKD-EPI Creatinine Equation (2021)    Anion  gap 7 5 - 15    Comment: Performed at [facility], [address] [place], [place] [address]  CBC     Status: Abnormal   Collection Time: 04/18/23  3:55 AM  Result Value Ref Range   WBC 9.5 4.0 - 10.5 K/uL   RBC 2.63 (L) 4.22 - 5.81 MIL/uL   Hemoglobin 8.3 (L) 13.0 - 17.0 g/dL   HCT 57.8 (L) 46.9 - 62.9 %   MCV 99.6 80.0 - 100.0 fL   MCH 31.6 26.0 - 34.0 pg   MCHC 31.7 30.0 - 36.0 g/dL   RDW 52.8 (H) 41.3 - 24.4 %   Platelets 263 150 - 400 K/uL   nRBC 0.7 (H) 0.0 - 0.2 %    Comment: Performed at [facility], [address] [place], [place] [address]  Phosphorus     Status: Abnormal   Collection Time: 04/18/23  3:55 AM  Result Value Ref Range   Phosphorus 2.3 (L) 2.5 - 4.6 mg/dL    Comment: Performed at [facility], [address] [place], [place] [address]  Magnesium     Status: None   Collection Time: 04/18/23  3:55 AM  Result Value Ref Range   Magnesium 2.0 1.7 - 2.4 mg/dL    Comment: Performed at [facility], [address], [place], [place] [address]  Glucose, capillary     Status: None   Collection Time: 04/18/23  6:32 AM  Result Value Ref Range   Glucose-Capillary 96 70 - 99 mg/dL    Comment: Glucose reference range applies only to samples taken after fasting for at least 8 hours.  Glucose, capillary     Status: Abnormal   Collection Time: 04/18/23 11:23 AM  Result Value Ref Range   Glucose-Capillary 116 (H) 70 - 99 mg/dL    Comment: Glucose reference range applies only to samples taken after fasting for at least 8 hours.  Glucose, capillary     Status: Abnormal   Collection Time: 04/18/23  5:41 PM  Result Value Ref Range   Glucose-Capillary 155 (H) 70 - 99 mg/dL    Comment: Glucose reference range applies only to samples taken after fasting for at least 8 hours.   Comment 1 Notify RN    Comment 2 Document in  Chart   Glucose, capillary     Status: Abnormal   Collection Time: 04/18/23  9:16 PM  Result Value Ref Range    Glucose-Capillary 166 (H) 70 - 99 mg/dL    Comment: Glucose reference range applies only to samples taken after fasting for at least 8 hours.   Comment 1 Notify RN    Comment 2 Document in Chart   Glucose, capillary     Status: Abnormal   Collection Time: 04/19/23  6:18 AM  Result Value Ref Range   Glucose-Capillary 101 (H) 70 - 99 mg/dL    Comment: Glucose reference range applies only to samples taken after fasting for at least 8 hours.   Comment 1 Notify RN    Comment 2 Document in Chart     ECG   N/A  Telemetry   Afib with CVR - Personally Reviewed  Radiology    DG Abd Portable 1V Result Date: 04/17/2023 CLINICAL DATA:  [id number] Constipation by delayed colonic transit [id number] EXAM: PORTABLE ABDOMEN - 1 VIEW COMPARISON:  CT 08/19/2022 FINDINGS: Gas throughout nondistended bowel. No bowel obstruction or free air. Multiple calcified gallstones. No acute bony abnormality. IMPRESSION: No evidence of bowel obstruction or free air. Cholelithiasis. Electronically Signed   By: Charlett Nose M.D.   On: 04/17/2023 12:09    Cardiac Studies   N/A  Assessment   Principal Problem:   Sepsis (HCC) Active Problems:   Athscl heart disease of native coronary artery w/o ang pctrs   DM2 (diabetes mellitus, type 2) (HCC)   GERD (gastroesophageal reflux disease)   Hyperlipidemia, unspecified   Essential hypertension   Chronic systolic (congestive) heart failure (HCC)   Hx of CABG   Anemia in chronic kidney disease   Chronic atrial fibrillation (HCC)   PAD (peripheral artery disease) (HCC)   Severe sepsis (HCC)   Protein-calorie malnutrition, severe   Plan   HR already improved - does not look like he was given digoxin yesterday. Will start low dose 0.125 mg oral daily today. Will need digoxin level next week, possibly to be drawn at the rehab facility. Can follow-up with [place] as outpatient. No further suggestions at this time.  [facility] will sign off.   Medication  Recommendations:  as above Other recommendations (labs, testing, etc):  none Follow up as an outpatient:  Dr. Rennis Golden or APP  Time Spent Directly with Patient:  I have spent a total of 25 minutes with the patient reviewing hospital notes, telemetry, EKGs, labs and examining the patient as well as establishing an assessment and plan that was discussed personally with the patient.  > 50% of time was spent in direct patient care.  Length of Stay:  LOS: 6 days   Chrystie Nose, MD, [facility], FACP  [place]  [facility]  Medical Director of the Advanced Lipid Disorders &  Cardiovascular Risk Reduction Clinic Diplomate of the American Board of Clinical Lipidology Attending Cardiologist  Direct Dial: [phone]  Fax: [phone]  Website:  [email or web address] Nicely Simuel Stebner 04/19/2023, 9:24 AM

## 2023-04-19 NOTE — Progress Notes (Signed)
 Called report to [facility] . [facility] called for transport

## 2023-04-20 DIAGNOSIS — E118 Type 2 diabetes mellitus with unspecified complications: Secondary | ICD-10-CM | POA: Diagnosis not present

## 2023-04-20 DIAGNOSIS — D631 Anemia in chronic kidney disease: Secondary | ICD-10-CM | POA: Diagnosis not present

## 2023-04-20 DIAGNOSIS — K219 Gastro-esophageal reflux disease without esophagitis: Secondary | ICD-10-CM | POA: Diagnosis not present

## 2023-04-20 DIAGNOSIS — E785 Hyperlipidemia, unspecified: Secondary | ICD-10-CM | POA: Diagnosis not present

## 2023-04-20 DIAGNOSIS — E43 Unspecified severe protein-calorie malnutrition: Secondary | ICD-10-CM | POA: Diagnosis not present

## 2023-04-20 DIAGNOSIS — I5022 Chronic systolic (congestive) heart failure: Secondary | ICD-10-CM | POA: Diagnosis not present

## 2023-04-20 DIAGNOSIS — J449 Chronic obstructive pulmonary disease, unspecified: Secondary | ICD-10-CM | POA: Diagnosis not present

## 2023-04-20 DIAGNOSIS — I110 Hypertensive heart disease with heart failure: Secondary | ICD-10-CM | POA: Diagnosis not present

## 2023-04-20 DIAGNOSIS — I739 Peripheral vascular disease, unspecified: Secondary | ICD-10-CM | POA: Diagnosis not present

## 2023-04-20 DIAGNOSIS — R6520 Severe sepsis without septic shock: Secondary | ICD-10-CM | POA: Diagnosis not present

## 2023-04-20 DIAGNOSIS — L03116 Cellulitis of left lower limb: Secondary | ICD-10-CM | POA: Diagnosis not present

## 2023-04-20 DIAGNOSIS — I482 Chronic atrial fibrillation, unspecified: Secondary | ICD-10-CM | POA: Diagnosis not present

## 2023-04-20 LAB — ZINC: Zinc: 36 ug/dL — ABNORMAL LOW (ref 44–115)

## 2023-04-21 ENCOUNTER — Other Ambulatory Visit: Payer: Self-pay | Admitting: Family Medicine

## 2023-04-21 DIAGNOSIS — E1169 Type 2 diabetes mellitus with other specified complication: Secondary | ICD-10-CM

## 2023-04-21 NOTE — Telephone Encounter (Signed)
 Requested by interface surescripts. Medication discontinued 04/19/23.  Requested Prescriptions  Refused Prescriptions Disp Refills   glipiZIDE (GLUCOTROL XL) 5 MG 24 hr tablet [Pharmacy Med Name: glipiZIDE XL 5 MG TABLET] 30 tablet 3    Sig: TAKE 1 TABLET BY MOUTH DAILY WITH BREAKFAST     Endocrinology:  Diabetes - Sulfonylureas Passed - 04/21/2023  3:54 PM      Passed - HBA1C is between 0 and 7.9 and within 180 days    Hgb A1c MFr Bld  Date Value Ref Range Status  02/08/2023 6.2 (H) 4.8 - 5.6 % Final    Comment:    (NOTE) Pre diabetes:          5.7%-6.4%  Diabetes:              >6.4%  Glycemic control for   <7.0% adults with diabetes          Passed - Cr in normal range and within 360 days    Creat  Date Value Ref Range Status  03/07/2023 1.24 (H) 0.70 - 1.22 mg/dL Final   Creatinine, Ser  Date Value Ref Range Status  04/18/2023 1.15 0.61 - 1.24 mg/dL Final   Creatinine, Urine  Date Value Ref Range Status  11/13/2021 94 20 - 320 mg/dL Final         Passed - Valid encounter within last 6 months    Recent Outpatient Visits           3 weeks ago Acute midline low back pain without sciatica   [facility] [name], MD   2 months ago Ulcerated, foot, left, with fat layer exposed [facility])   [facility] [name], MD   3 months ago Ulcerated, foot, left, with fat layer exposed [facility])   [facility] [name], MD   3 months ago Ulcerated, foot, left, with fat layer exposed [facility])   [facility] [name], MD   3 months ago Ulcerated, foot, left, with fat layer exposed [facility])   [facility] Pickard, Priscille Heidelberg, MD       Future Appointments             In 3 weeks Cleaver, Thomasene Ripple, NP  HeartCare at [facility]   In 1 month Rice, Jamesetta Orleans, MD [facility] Health Rheumatology - A Dept Of [place].  [facility]   In 3 months Hilty, Lisette Abu, MD [facility] HeartCare at [facility]

## 2023-04-22 DIAGNOSIS — D631 Anemia in chronic kidney disease: Secondary | ICD-10-CM | POA: Diagnosis not present

## 2023-04-22 DIAGNOSIS — D849 Immunodeficiency, unspecified: Secondary | ICD-10-CM | POA: Diagnosis not present

## 2023-04-22 DIAGNOSIS — I110 Hypertensive heart disease with heart failure: Secondary | ICD-10-CM | POA: Diagnosis not present

## 2023-04-22 DIAGNOSIS — E118 Type 2 diabetes mellitus with unspecified complications: Secondary | ICD-10-CM | POA: Diagnosis not present

## 2023-04-22 DIAGNOSIS — J449 Chronic obstructive pulmonary disease, unspecified: Secondary | ICD-10-CM | POA: Diagnosis not present

## 2023-04-22 DIAGNOSIS — I5022 Chronic systolic (congestive) heart failure: Secondary | ICD-10-CM | POA: Diagnosis not present

## 2023-04-22 DIAGNOSIS — R2689 Other abnormalities of gait and mobility: Secondary | ICD-10-CM | POA: Diagnosis not present

## 2023-04-22 DIAGNOSIS — E785 Hyperlipidemia, unspecified: Secondary | ICD-10-CM | POA: Diagnosis not present

## 2023-04-22 DIAGNOSIS — K219 Gastro-esophageal reflux disease without esophagitis: Secondary | ICD-10-CM | POA: Diagnosis not present

## 2023-04-22 DIAGNOSIS — M6281 Muscle weakness (generalized): Secondary | ICD-10-CM | POA: Diagnosis not present

## 2023-04-22 DIAGNOSIS — I739 Peripheral vascular disease, unspecified: Secondary | ICD-10-CM | POA: Diagnosis not present

## 2023-04-22 DIAGNOSIS — A419 Sepsis, unspecified organism: Secondary | ICD-10-CM | POA: Diagnosis not present

## 2023-04-22 DIAGNOSIS — E43 Unspecified severe protein-calorie malnutrition: Secondary | ICD-10-CM | POA: Diagnosis not present

## 2023-04-22 DIAGNOSIS — R6520 Severe sepsis without septic shock: Secondary | ICD-10-CM | POA: Diagnosis not present

## 2023-04-22 DIAGNOSIS — I482 Chronic atrial fibrillation, unspecified: Secondary | ICD-10-CM | POA: Diagnosis not present

## 2023-04-22 DIAGNOSIS — E440 Moderate protein-calorie malnutrition: Secondary | ICD-10-CM | POA: Diagnosis not present

## 2023-04-22 DIAGNOSIS — L03116 Cellulitis of left lower limb: Secondary | ICD-10-CM | POA: Diagnosis not present

## 2023-04-22 NOTE — Progress Notes (Signed)
 [facility] Liaison Note  04/22/2023  Donald Rios July 08, 1940 [id number]  Location: RN Hospital Liaison screened the patient remotely at [facility].  Insurance: Medicare   Donald Rios is a 83 y.o. male who is a Primary Care Patient of [name], MD The patient was screened for 30 day readmission hospitalization with noted high risk score for unplanned readmission risk with 3 IP in 6 months.  The patient was assessed for potential Care Management service needs for post hospital transition for care coordination. Review of patient's electronic medical record reveals patient was admitted for Sepsis. Pt transitioned to [employer] for [employer] Liaison will collaborate with PAC-RN on pt's discharge disposition.   Plan: [facility] Liaison will continue to follow progress and disposition to asess for post hospital community care coordination/management needs.  Referral request for community care coordination: Liaison will collaborate with PAC-RN to follow.   [facility] Care Management/Population Health does not replace or interfere with any arrangements made by the Inpatient Transition of Care team.   For questions contact:   Elliot Cousin, RN, BSN Hospital Liaison [place]   [facility], Population Health Office Hours MTWF  8:00 am-6:00 pm Direct Dial: [phone] [email or web address]

## 2023-04-25 ENCOUNTER — Emergency Department (HOSPITAL_COMMUNITY)

## 2023-04-25 ENCOUNTER — Other Ambulatory Visit: Payer: Self-pay | Admitting: Family Medicine

## 2023-04-25 ENCOUNTER — Emergency Department (HOSPITAL_COMMUNITY)
Admission: EM | Admit: 2023-04-25 | Discharge: 2023-04-25 | Disposition: A | Discharge status: Home / Self Care | Attending: Emergency Medicine | Admitting: Emergency Medicine

## 2023-04-25 ENCOUNTER — Other Ambulatory Visit: Payer: Self-pay

## 2023-04-25 DIAGNOSIS — J449 Chronic obstructive pulmonary disease, unspecified: Secondary | ICD-10-CM | POA: Insufficient documentation

## 2023-04-25 DIAGNOSIS — Y92129 Unspecified place in nursing home as the place of occurrence of the external cause: Secondary | ICD-10-CM | POA: Diagnosis not present

## 2023-04-25 DIAGNOSIS — I5022 Chronic systolic (congestive) heart failure: Secondary | ICD-10-CM | POA: Diagnosis not present

## 2023-04-25 DIAGNOSIS — W06XXXA Fall from bed, initial encounter: Secondary | ICD-10-CM | POA: Insufficient documentation

## 2023-04-25 DIAGNOSIS — I2510 Atherosclerotic heart disease of native coronary artery without angina pectoris: Secondary | ICD-10-CM | POA: Diagnosis not present

## 2023-04-25 DIAGNOSIS — J90 Pleural effusion, not elsewhere classified: Secondary | ICD-10-CM | POA: Insufficient documentation

## 2023-04-25 DIAGNOSIS — D631 Anemia in chronic kidney disease: Secondary | ICD-10-CM | POA: Diagnosis not present

## 2023-04-25 DIAGNOSIS — S0990XA Unspecified injury of head, initial encounter: Secondary | ICD-10-CM | POA: Diagnosis not present

## 2023-04-25 DIAGNOSIS — R0989 Other specified symptoms and signs involving the circulatory and respiratory systems: Secondary | ICD-10-CM | POA: Diagnosis not present

## 2023-04-25 DIAGNOSIS — Z794 Long term (current) use of insulin: Secondary | ICD-10-CM | POA: Diagnosis not present

## 2023-04-25 DIAGNOSIS — I10 Essential (primary) hypertension: Secondary | ICD-10-CM | POA: Diagnosis not present

## 2023-04-25 DIAGNOSIS — Z79899 Other long term (current) drug therapy: Secondary | ICD-10-CM | POA: Insufficient documentation

## 2023-04-25 DIAGNOSIS — I482 Chronic atrial fibrillation, unspecified: Secondary | ICD-10-CM | POA: Diagnosis not present

## 2023-04-25 DIAGNOSIS — S3993XA Unspecified injury of pelvis, initial encounter: Secondary | ICD-10-CM | POA: Diagnosis not present

## 2023-04-25 DIAGNOSIS — I517 Cardiomegaly: Secondary | ICD-10-CM | POA: Diagnosis not present

## 2023-04-25 DIAGNOSIS — E785 Hyperlipidemia, unspecified: Secondary | ICD-10-CM | POA: Diagnosis not present

## 2023-04-25 DIAGNOSIS — Z7901 Long term (current) use of anticoagulants: Secondary | ICD-10-CM | POA: Diagnosis not present

## 2023-04-25 DIAGNOSIS — E43 Unspecified severe protein-calorie malnutrition: Secondary | ICD-10-CM | POA: Diagnosis not present

## 2023-04-25 DIAGNOSIS — E119 Type 2 diabetes mellitus without complications: Secondary | ICD-10-CM | POA: Insufficient documentation

## 2023-04-25 DIAGNOSIS — U071 COVID-19: Secondary | ICD-10-CM | POA: Diagnosis not present

## 2023-04-25 DIAGNOSIS — E876 Hypokalemia: Secondary | ICD-10-CM | POA: Insufficient documentation

## 2023-04-25 DIAGNOSIS — W19XXXA Unspecified fall, initial encounter: Secondary | ICD-10-CM

## 2023-04-25 DIAGNOSIS — I739 Peripheral vascular disease, unspecified: Secondary | ICD-10-CM | POA: Diagnosis not present

## 2023-04-25 DIAGNOSIS — S299XXA Unspecified injury of thorax, initial encounter: Secondary | ICD-10-CM | POA: Diagnosis not present

## 2023-04-25 DIAGNOSIS — K219 Gastro-esophageal reflux disease without esophagitis: Secondary | ICD-10-CM | POA: Diagnosis not present

## 2023-04-25 DIAGNOSIS — E118 Type 2 diabetes mellitus with unspecified complications: Secondary | ICD-10-CM | POA: Diagnosis not present

## 2023-04-25 DIAGNOSIS — S199XXA Unspecified injury of neck, initial encounter: Secondary | ICD-10-CM | POA: Diagnosis not present

## 2023-04-25 DIAGNOSIS — M8580 Other specified disorders of bone density and structure, unspecified site: Secondary | ICD-10-CM | POA: Diagnosis not present

## 2023-04-25 DIAGNOSIS — R6520 Severe sepsis without septic shock: Secondary | ICD-10-CM | POA: Diagnosis not present

## 2023-04-25 DIAGNOSIS — L03116 Cellulitis of left lower limb: Secondary | ICD-10-CM | POA: Diagnosis not present

## 2023-04-25 DIAGNOSIS — I110 Hypertensive heart disease with heart failure: Secondary | ICD-10-CM | POA: Diagnosis not present

## 2023-04-25 DIAGNOSIS — G319 Degenerative disease of nervous system, unspecified: Secondary | ICD-10-CM | POA: Diagnosis not present

## 2023-04-25 LAB — CBC WITH DIFFERENTIAL/PLATELET
Abs Immature Granulocytes: 0.1 10*3/uL — ABNORMAL HIGH (ref 0.00–0.07)
Basophils Absolute: 0 10*3/uL (ref 0.0–0.1)
Basophils Relative: 0 %
Eosinophils Absolute: 0.1 10*3/uL (ref 0.0–0.5)
Eosinophils Relative: 1 %
HCT: 34 % — ABNORMAL LOW (ref 39.0–52.0)
Hemoglobin: 10.4 g/dL — ABNORMAL LOW (ref 13.0–17.0)
Immature Granulocytes: 1 %
Lymphocytes Relative: 14 %
Lymphs Abs: 1.3 10*3/uL (ref 0.7–4.0)
MCH: 31 pg (ref 26.0–34.0)
MCHC: 30.6 g/dL (ref 30.0–36.0)
MCV: 101.5 fL — ABNORMAL HIGH (ref 80.0–100.0)
Monocytes Absolute: 0.4 10*3/uL (ref 0.1–1.0)
Monocytes Relative: 5 %
Neutro Abs: 7.4 10*3/uL (ref 1.7–7.7)
Neutrophils Relative %: 79 %
Platelets: 247 10*3/uL (ref 150–400)
RBC: 3.35 MIL/uL — ABNORMAL LOW (ref 4.22–5.81)
RDW: 19.3 % — ABNORMAL HIGH (ref 11.5–15.5)
WBC: 9.3 10*3/uL (ref 4.0–10.5)
nRBC: 0 % (ref 0.0–0.2)

## 2023-04-25 LAB — I-STAT CHEM 8, ED
BUN: 12 mg/dL (ref 8–23)
Calcium, Ion: 1.12 mmol/L — ABNORMAL LOW (ref 1.15–1.40)
Chloride: 106 mmol/L (ref 98–111)
Creatinine, Ser: 1 mg/dL (ref 0.61–1.24)
Glucose, Bld: 103 mg/dL — ABNORMAL HIGH (ref 70–99)
HCT: 34 % — ABNORMAL LOW (ref 39.0–52.0)
Hemoglobin: 11.6 g/dL — ABNORMAL LOW (ref 13.0–17.0)
Potassium: 3.4 mmol/L — ABNORMAL LOW (ref 3.5–5.1)
Sodium: 141 mmol/L (ref 135–145)
TCO2: 26 mmol/L (ref 22–32)

## 2023-04-25 NOTE — ED Provider Notes (Signed)
 [place] EMERGENCY DEPARTMENT AT [facility] Provider Note   CSN: [id number] Arrival date & time: 04/25/23  1308     History  Chief Complaint  Patient presents with   Trauma    Level 2  fall on thinners    Donald Rios is a 83 y.o. male.  The history is provided by the patient.  Trauma Mechanism of injury: Out of bed and fall Injury location: head/neck Injury location detail: head (posterior head hit floor) Incident location: nursing home Time since incident: minutes. Arrived directly from scene: yes   Fall:      Fall occurred: from a bed      Impact surface: hard floor      Point of impact: head  Protective equipment:       None  EMS/PTA data:      Bystander interventions: none  Current symptoms:      Associated symptoms:            Denies chest pain.   Relevant PMH:      The patient has not been admitted to the hospital due to injury in the past year, and has not been treated and released from the ED due to injury in the past year. Patient with CAD BIB for fall on thinners.  Patient seen here for COVID 12 days ago.      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    History of MI (myocardial infarction)    History of nuclear stress test 08/07/2010   dipyridamole; EKG negative for ischemia, low risk scan    Hyperlipidemia    Hyperplastic colon polyp 05/2008   Hypertension    Ischemic cardiomyopathy    EF 45%, with inferior wall motion abnormality    Myocardial infarction (HCC)    Valvular regurgitation     mitral and tricuspid (mild)     Home Medications Prior to Admission medications   Medication Sig Start Date End Date Taking? Authorizing Provider  acetaminophen (TYLENOL) 325 MG tablet Take 2 tablets (650 mg total) by mouth every 6 (six) hours as needed for mild pain (pain score 1-3) (or Fever >/= 101). 04/19/23   Hongalgi, Maximino Greenland, MD  allopurinol (ZYLOPRIM) 300 MG tablet TAKE 1 TABLET BY MOUTH DAILY Patient taking differently: Take 300 mg by mouth at bedtime. 01/28/23   [name], MD  amiodarone (PACERONE) 200 MG tablet Take 1 tablet (200 mg total) by mouth every morning. 04/19/23   Hongalgi, Maximino Greenland, MD  ascorbic acid (VITAMIN C) 250 MG tablet Take 1 tablet (250 mg total) by mouth 2 (two) times daily. 04/19/23   Hongalgi, Maximino Greenland, MD  atorvastatin (LIPITOR) 40 MG tablet Take 1 tablet (40 mg total) by mouth daily. 04/20/23   Hongalgi, Maximino Greenland, MD  bisacodyl (DULCOLAX) 10 MG suppository Place 1 suppository (10 mg total) rectally daily as needed for moderate constipation. 04/19/23   Hongalgi, Maximino Greenland, MD  cholecalciferol (CHOLECALCIFEROL) 25 MCG tablet Take 1 tablet (1,000 Units total) by mouth daily. 04/20/23   Hongalgi, Maximino Greenland, MD  cyanocobalamin (VITAMIN B12) 500 MCG tablet Take 1 tablet (500 mcg total) by mouth daily. 04/20/23   Hongalgi, Maximino Greenland, MD  digoxin (LANOXIN) 0.125 MG tablet Take 1 tablet (0.125 mg total) by mouth daily. 04/20/23   Hongalgi, Maximino Greenland, MD  ELIQUIS 5 MG TABS tablet TAKE 1 TABLET BY MOUTH 2 TIMES A DAY 03/23/23   [name], MD  empagliflozin (JARDIANCE) 10 MG TABS tablet Take 1 tablet (10 mg total) by mouth daily. Patient taking differently: Take 10 mg by mouth in the morning. 08/16/22   Arty Baumgartner, NP  feeding supplement (ENSURE ENLIVE / ENSURE PLUS) LIQD Take 237 mLs by mouth 3 (three) times daily between meals. 04/19/23   Hongalgi, Maximino Greenland, MD  fludrocortisone (FLORINEF) 0.1 MG tablet Take 2 tablets (0.2 mg total) by mouth 2 (two) times daily. 04/19/23    Hongalgi, Maximino Greenland, MD  insulin aspart (NOVOLOG) 100 UNIT/ML injection Inject 0-9 Units into the skin 3 (three) times daily with meals. 0-9 Units, Subcutaneous, 3 times daily with meals, First dose on Wed 04/13/23 at 0800 Correction coverage: Sensitive (thin, NPO, renal) CBG < 70: Implement Hypoglycemia protocol, CBG 70 - 120: 0 units CBG 121 - 150: 1 unit CBG 151 - 200: 2 units CBG 201 - 250: 3 units CBG 251 - 300: 5 units CBG 301 - 350: 7 units CBG 351 - 400: 9 units CBG > 400: call MD. 04/19/23   Elease Etienne, MD  metoprolol tartrate (LOPRESSOR) 50 MG tablet Take 1.5 tablets (75 mg total) by mouth 2 (two) times daily. 04/19/23   Hongalgi, Maximino Greenland, MD  midodrine (PROAMATINE) 5 MG tablet Take 3 tablets (15 mg total) by mouth 3 (three) times daily with meals. 04/19/23   Hongalgi, Maximino Greenland, MD  mirtazapine (REMERON) 30 MG tablet TAKE 1 TABLET BY MOUTH EVERY NIGHT AT BEDTIME FOR APPETITE Patient taking differently: Take 30 mg by mouth at bedtime. TAKE 1 TABLET BY MOUTH EVERY NIGHT AT BEDTIME FOR APPETITE 03/07/23   [name], MD  Multiple Vitamin (MULTIVITAMIN WITH MINERALS) TABS tablet Take 1 tablet by mouth daily. 04/20/23   Hongalgi, Maximino Greenland, MD  pantoprazole (PROTONIX) 40 MG tablet TAKE 1 TABLET BY MOUTH DAILY Patient taking differently: Take 40 mg by mouth in the morning. 03/04/23   [name], MD  polyethylene glycol (MIRALAX / GLYCOLAX) 17 g packet Take 17 g by mouth daily. 04/19/23   Hongalgi, Maximino Greenland, MD  senna-docusate (SENOKOT-S) 8.6-50 MG tablet Take 1 tablet by mouth at bedtime. 04/19/23   Hongalgi, Maximino Greenland, MD  thiamine (VITAMIN B-1) 100 MG tablet Take 1 tablet (100 mg total) by mouth daily. 04/20/23   Hongalgi, Maximino Greenland, MD  Tocilizumab (ACTEMRA IV) Inject 6 mg/kg into the vein every 28 (twenty-eight) days.    [provider]  vitamin A 3 MG (10000 UNITS) capsule Take 1 capsule (10,000 Units total) by mouth daily. 04/20/23   Hongalgi, Maximino Greenland, MD  zinc sulfate, 50mg  elemental zinc,  220 (50 Zn) MG capsule Take 1 capsule (220 mg total) by mouth daily. 04/20/23   Hongalgi, Maximino Greenland, MD      Allergies    Patient has no known allergies.    Review of Systems   Review of Systems  Constitutional:  Negative for fever.  Respiratory:  Negative for shortness of breath,  wheezing and stridor.   Cardiovascular:  Negative for chest pain.  Musculoskeletal:  Negative for arthralgias.  All other systems reviewed and are negative.   Physical Exam Updated Vital Signs BP (!) 148/58 (BP Location: Left Arm)   Pulse 69   Temp 97.9 F (36.6 C) (Oral)   Resp 18   SpO2 100%  Physical Exam Vitals and nursing note reviewed. Exam conducted with a chaperone present.  Constitutional:      General: He is not in acute distress.    Appearance: Normal appearance. He is well-developed. He is not diaphoretic.  HENT:     Head: Normocephalic and atraumatic.     Nose: Nose normal.     Mouth/Throat:     Mouth: Mucous membranes are moist.     Pharynx: Oropharynx is clear.  Eyes:     Conjunctiva/sclera: Conjunctivae normal.     Pupils: Pupils are equal, round, and reactive to light.  Cardiovascular:     Rate and Rhythm: Normal rate and regular rhythm.     Pulses: Normal pulses.     Heart sounds: Normal heart sounds.  Pulmonary:     Effort: Pulmonary effort is normal.     Breath sounds: Normal breath sounds. No wheezing or rales.  Abdominal:     General: Bowel sounds are normal.     Palpations: Abdomen is soft.     Tenderness: There is no abdominal tenderness. There is no guarding or rebound.  Musculoskeletal:        General: No tenderness. Normal range of motion.     Cervical back: Normal range of motion and neck supple. No tenderness.  Skin:    General: Skin is warm and dry.     Capillary Refill: Capillary refill takes less than 2 seconds.  Neurological:     General: No focal deficit present.     Mental Status: He is alert.     Deep Tendon Reflexes: Reflexes normal.  Psychiatric:         Mood and Affect: Mood normal.     ED Results / Procedures / Treatments   Labs (all labs ordered are listed, but only abnormal results are displayed) Results for orders placed or performed during the hospital encounter of 04/25/23  CBC with Differential   Collection Time: 04/25/23  4:55 AM  Result Value Ref Range   WBC 9.3 4.0 - 10.5 K/uL   RBC 3.35 (L) 4.22 - 5.81 MIL/uL   Hemoglobin 10.4 (L) 13.0 - 17.0 g/dL   HCT 84.6 (L) 96.2 - 95.2 %   MCV 101.5 (H) 80.0 - 100.0 fL   MCH 31.0 26.0 - 34.0 pg   MCHC 30.6 30.0 - 36.0 g/dL   RDW 84.1 (H) 32.4 - 40.1 %   Platelets 247 150 - 400 K/uL   nRBC 0.0 0.0 - 0.2 %   Neutrophils Relative % 79 %   Neutro Abs 7.4 1.7 - 7.7 K/uL   Lymphocytes Relative 14 %   Lymphs Abs 1.3 0.7 - 4.0 K/uL   Monocytes Relative 5 %   Monocytes Absolute 0.4 0.1 - 1.0 K/uL   Eosinophils Relative 1 %   Eosinophils Absolute 0.1 0.0 - 0.5 K/uL   Basophils Relative 0 %   Basophils Absolute 0.0 0.0 - 0.1 K/uL   Immature Granulocytes 1 %   Abs Immature Granulocytes 0.10 (H) 0.00 - 0.07 K/uL  I-stat chem 8, ED (not at [facility], [facility] or [facility])   Collection Time: 04/25/23  5:05 AM  Result Value Ref Range   Sodium 141 135 - 145 mmol/L   Potassium 3.4 (L) 3.5 - 5.1 mmol/L   Chloride 106 98 - 111 mmol/L   BUN 12 8 - 23 mg/dL   Creatinine, Ser 1.61 0.61 - 1.24 mg/dL   Glucose, Bld 096 (H) 70 - 99 mg/dL   Calcium, Ion 0.45 (L) 1.15 - 1.40 mmol/L   TCO2 26 22 - 32 mmol/L   Hemoglobin 11.6 (L) 13.0 - 17.0 g/dL   HCT 40.9 (L) 81.1 - 91.4 %   CT Head Wo Contrast Result Date: 04/25/2023 CLINICAL DATA:  Level II trauma.  Blunt poly trauma. EXAM: CT HEAD WITHOUT CONTRAST CT CERVICAL SPINE WITHOUT CONTRAST TECHNIQUE: Multidetector CT imaging of the head and cervical spine was performed following the standard protocol without intravenous contrast. Multiplanar CT image reconstructions of the cervical spine were also generated. RADIATION DOSE REDUCTION: This exam was performed  according to the departmental dose-optimization program which includes automated exposure control, adjustment of the mA and/or kV according to patient size and/or use of iterative reconstruction technique. COMPARISON:  Head CT 11/16/2022. FINDINGS: CT HEAD FINDINGS Brain: There is no evidence for acute hemorrhage, hydrocephalus, mass lesion, or abnormal extra-axial fluid collection. No definite CT evidence for acute infarction. Diffuse loss of parenchymal volume is consistent with atrophy. Patchy low attenuation in the deep hemispheric and periventricular white matter is nonspecific, but likely reflects chronic microvascular ischemic demyelination. Stable chronic infarct right basal ganglia. Tiny lacunar infarcts are seen in the left cerebellum. Vascular: No hyperdense vessel or unexpected calcification. Skull: No evidence for fracture. No worrisome lytic or sclerotic lesion. Sinuses/Orbits: Trace chronic mucosal disease posterior right ethmoid air cells. The visualized paranasal sinuses and mastoid air cells are otherwise clear. Visualized portions of the globes and intraorbital fat are unremarkable. Other: None CT CERVICAL SPINE FINDINGS Alignment: No evidence for traumatic subluxation. Skull base and vertebrae: No acute fracture. No primary bone lesion or focal pathologic process. Soft tissues and spinal canal: No prevertebral fluid or swelling. No visible canal hematoma. Disc levels: There is diffuse loss of disc height in the cervical spine, most prominent at C5-6. Facets are well aligned bilaterally. Upper chest: Pleural fluid visible in the left lung apex suggest the presence of a large left pleural effusion. Other: None. IMPRESSION: 1. No acute intracranial abnormality. 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 2. Atrophy with chronic small vessel white matter ischemic disease. 3. No evidence for cervical spine fracture or traumatic subluxation. 4. Pleural fluid visible in  the left lung apex suggest the presence of a large left pleural effusion. Electronically Signed   By: Donnal Fusi M.D.   On: 04/25/2023 05:24   CT Cervical Spine Wo Contrast Result Date: 04/25/2023 CLINICAL DATA:  Level II trauma.  Blunt poly trauma. EXAM: CT HEAD WITHOUT CONTRAST CT CERVICAL SPINE WITHOUT CONTRAST TECHNIQUE: Multidetector CT imaging of the head and cervical spine was performed following the standard protocol without intravenous contrast. Multiplanar CT image reconstructions of the cervical spine were also generated. RADIATION DOSE REDUCTION: This exam was performed according to the departmental dose-optimization program which includes automated exposure control, adjustment of the mA and/or kV according to patient size and/or use of iterative reconstruction technique. COMPARISON:  Head CT 11/16/2022. FINDINGS: CT HEAD FINDINGS Brain: There is no evidence for acute hemorrhage, hydrocephalus, mass lesion, or abnormal extra-axial fluid collection. No definite CT evidence for acute infarction. Diffuse loss of parenchymal volume is consistent with atrophy.  Patchy low attenuation in the deep hemispheric and periventricular white matter is nonspecific, but likely reflects chronic microvascular ischemic demyelination. Stable chronic infarct right basal ganglia. Tiny lacunar infarcts are seen in the left cerebellum. Vascular: No hyperdense vessel or unexpected calcification. Skull: No evidence for fracture. No worrisome lytic or sclerotic lesion. Sinuses/Orbits: Trace chronic mucosal disease posterior right ethmoid air cells. The visualized paranasal sinuses and mastoid air cells are otherwise clear. Visualized portions of the globes and intraorbital fat are unremarkable. Other: None CT CERVICAL SPINE FINDINGS Alignment: No evidence for traumatic subluxation. Skull base and vertebrae: No acute fracture. No primary bone lesion or focal pathologic process. Soft tissues and spinal canal: No prevertebral  fluid or swelling. No visible canal hematoma. Disc levels: There is diffuse loss of disc height in the cervical spine, most prominent at C5-6. Facets are well aligned bilaterally. Upper chest: Pleural fluid visible in the left lung apex suggest the presence of a large left pleural effusion. Other: None. IMPRESSION: 1. No acute intracranial abnormality. 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 2. Atrophy with chronic small vessel white matter ischemic disease. 3. No evidence for cervical spine fracture or traumatic subluxation. 4. Pleural fluid visible in the left lung apex suggest the presence of a large left pleural effusion. Electronically Signed   By: Kennith Center M.D.   On: 04/25/2023 05:24   DG Pelvis Portable Result Date: 04/25/2023 CLINICAL DATA:  Trauma due to fall EXAM: PORTABLE PELVIS 1 VIEWS COMPARISON:  None Available. FINDINGS: Generalized osteopenia. No evidence of fracture or hip dislocation. No pelvic ring diastasis. IMPRESSION: No acute finding. Electronically Signed   By: Tiburcio Pea M.D.   On: 04/25/2023 05:15   DG Chest Portable 1 View Result Date: 04/25/2023 CLINICAL DATA:  Trauma with fall EXAM: PORTABLE CHEST 1 VIEW COMPARISON:  04/14/2023 FINDINGS: Cardiomegaly with vascular pedicle widening.  There has been CABG. Congested appearance of vessels with hazy density at the left base likely reflecting scar. No pneumothorax. No acute osseous finding. IMPRESSION: Stable cardiomegaly and vascular congestion. Electronically Signed   By: Tiburcio Pea M.D.   On: 04/25/2023 05:13   DG Abd Portable 1V Result Date: 04/17/2023 CLINICAL DATA:  [id number] Constipation by delayed colonic transit [id number] EXAM: PORTABLE ABDOMEN - 1 VIEW COMPARISON:  CT 08/19/2022 FINDINGS: Gas throughout nondistended bowel. No bowel obstruction or free air. Multiple calcified gallstones. No acute bony abnormality. IMPRESSION: No evidence of bowel obstruction or free air.  Cholelithiasis. Electronically Signed   By: Charlett Nose M.D.   On: 04/17/2023 12:09   ECHOCARDIOGRAM LIMITED Result Date: 04/14/2023    ECHOCARDIOGRAM LIMITED REPORT   Patient Name:   Donald Rios Date of Exam: 04/14/2023 Medical Rec #:  [id number]      Height:       68.0 in Accession #:    [id number]     Weight:       140.9 lb Date of Birth:  07-Jan-1941      BSA:          1.761 m Patient Age:    82 years       BP:           113/77 mmHg Patient Gender: M              HR:           112 bpm. Exam Location:  Inpatient Procedure: Limited Echo Indications:    Shock  History:  Patient has prior history of Echocardiogram examinations, most                 recent 12/07/2022. Cardiomyopathy, CAD and Previous Myocardial                 Infarction; COPD.  Sonographer:    Darlys Gales Referring Phys: [id number] Vinnie Level SMITH IMPRESSIONS  1. Limited echo for LVEF  2. Left ventricular ejection fraction, by estimation, is 25 to 30%. Left ventricular ejection fraction by PLAX is 30 %. The left ventricle has severely decreased function. The left ventricle demonstrates global hypokinesis.  3. Left atrial size was moderately dilated. Comparison(s): Changes from prior study are noted. 12/07/2022: LVEF 25%. FINDINGS  Left Ventricle: Left ventricular ejection fraction, by estimation, is 25 to 30%. Left ventricular ejection fraction by PLAX is 30 %. The left ventricle has severely decreased function. The left ventricle demonstrates global hypokinesis. There is no left  ventricular hypertrophy. Left Atrium: Left atrial size was moderately dilated. LEFT VENTRICLE PLAX 2D LV EF:         Left ventricular ejection fraction by PLAX is 30 %. LVIDd:         5.00 cm LVIDs:         4.30 cm LV PW:         0.70 cm LV IVS:        0.90 cm  LEFT ATRIUM             Index LA Vol (A2C):   43.6 ml 24.76 ml/m LA Vol (A4C):   64.7 ml 36.74 ml/m LA Biplane Vol: 54.8 ml 31.12 ml/m Zoila Shutter MD Electronically signed by Zoila Shutter MD Signature  Date/Time: 04/14/2023/5:07:17 PM    Final    DG Chest Port 1 View Result Date: 04/14/2023 CLINICAL DATA:  Status post PICC placement. EXAM: PORTABLE CHEST 1 VIEW COMPARISON:  Angele Wiemann 2, 2025. FINDINGS: Stable cardiomegaly. Status post coronary bypass graft. Interval placement of left-sided PICC line with distal tip in expected position of cavoatrial junction. Right lung is clear. Mild central pulmonary vascular congestion is noted. Left basilar atelectasis is noted with possible small pleural effusion. IMPRESSION: Interval placement of left-sided PICC line with distal tip in expected position of cavoatrial junction. Mild central pulmonary vascular congestion. Stable left basilar atelectasis with small left pleural effusion. Electronically Signed   By: Lupita Raider M.D.   On: 04/14/2023 13:59   [place] EKG SITE RITE Result Date: 04/14/2023 If Site Rite image not attached, placement could not be confirmed due to current cardiac rhythm.  DG Chest Port 1 View Result Date: 04/13/2023 CLINICAL DATA:  CHF EXAM: PORTABLE CHEST 1 VIEW COMPARISON:  X-ray 04/12/2023 FINDINGS: Hyperinflation. Large cardiopericardial silhouette. Sternal wires. Vascular congestion. Persistent lung base opacities with a left-sided effusion. Opacities more left than right and slightly increased bilaterally. No pneumothorax. Overlapping cardiac leads. Film is rotated. IMPRESSION: Postop chest. Increasing lung base opacity. Component of effusion on the left. Recommend follow-up Electronically Signed   By: Karen Kays M.D.   On: 04/13/2023 18:21   MR HEEL LEFT WO CONTRAST Result Date: 04/13/2023 CLINICAL DATA:  Left heel pain. Recent history of left fifth toe amputation. EXAM: MR OF THE LEFT HEEL WITHOUT CONTRAST TECHNIQUE: Multiplanar, multisequence MR imaging of the left hindfoot was performed. No intravenous contrast was administered. COMPARISON:  Left foot radiographs dated 04/12/2023. MRI of the left foot dated 12/28/2022. FINDINGS:  Bones/Joint/Cartilage No marrow signal abnormality to suggest osteomyelitis. No acute fracture  or dislocation. Normal alignment. No joint effusion. Mild joint space narrowing, subchondral cystic change, and marginal spurring at the talonavicular articulation. Mild degenerative changes of the midfoot. Calcaneal enthesopathy at the insertion of the Achilles tendon and the origin of the central cord of the plantar fascia. Ligaments Lisfranc ligament is intact. Muscles and Tendons Flexor, peroneal and extensor compartment tendons are intact. Mild tenosynovitis of the common peroneal tendon sheath. Mild tenosynovitis of the posterior tibialis tendon at the retromalleolar level. There is diffuse pronounced edema of the intrinsic musculature of the foot. No discrete intramuscular collection identified. Thickening and increased signal of the distal Achilles tendon. Soft tissue There is diffuse nonspecific subcutaneous edema of the visualized left lower extremity extending into the foot. Focal area of soft tissue irregularity, which may correspond to a wound, along the posterior heel, overlying the calcaneal insertion of the distal Achilles tendon. No discrete loculated fluid collection identified. IMPRESSION: 1. No evidence of osteomyelitis. 2. Diffuse pronounced nonspecific edema of the intrinsic musculature of the foot could reflect infectious myositis or additional myopathy. No intramuscular collection identified. 3. Focal area of soft tissue irregularity, which may correspond to a wound, along the posterior heel. Diffuse nonspecific subcutaneous edema of the visualized left lower extremity extending into the foot, cellulitis can not be excluded. No loculated fluid collection identified. 4. Mild tenosynovitis of the posterior tibialis tendon and common peroneal tendon sheath. 5. Chronic plantar fasciitis. 6. Distal Achilles tendinosis. 7. Mild degenerative changes of the midfoot. Electronically Signed   By: Mannie Seek M.D.   On: 04/13/2023 09:39   DG Foot Complete Left Result Date: 04/12/2023 CLINICAL DATA:  sepsis, foot infection. EXAM: LEFT FOOT - COMPLETE 3+ VIEW COMPARISON:  02/08/2023. FINDINGS: There is diffuse osteopenia of the visualized osseous structures. Since the prior study, patient underwent amputation of fifth toe, with resection at the level of middle third of the proximal phalanx. Resection margin is sharp. No focal bone erosions. No acute fracture or dislocation. No aggressive osseous lesion. Mild diffuse degenerative changes of imaged joints. Calcaneal spur noted along the Achilles tendon and Plantar aponeurosis attachment sites. No focal soft tissue swelling. No radiopaque foreign bodies. IMPRESSION: No acute osseous abnormality of the left foot. Electronically Signed   By: Beula Brunswick M.D.   On: 04/12/2023 13:35   DG Chest Port 1 View Result Date: 04/12/2023 CLINICAL DATA:  weakness, foot redness, sepsis. EXAM: PORTABLE CHEST 1 VIEW COMPARISON:  11/24/2018. FINDINGS: Low lung volume. There are probable atelectatic changes at the lung bases. Bilateral lung fields are otherwise clear. No dense consolidation or lung collapse. No pulmonary edema. Bilateral costophrenic angles are clear. Mildly enlarged cardio-mediastinal silhouette. There are surgical staples along the heart border and sternotomy wires, status post CABG (coronary artery bypass graft). No acute osseous abnormalities. The soft tissues are within normal limits. IMPRESSION: No active disease. Electronically Signed   By: Beula Brunswick M.D.   On: 04/12/2023 13:34   DG Lumbar Spine Complete Result Date: 04/02/2023 CLINICAL DATA:  Low back pain after fall. EXAM: LUMBAR SPINE - COMPLETE 4+ VIEW COMPARISON:  CT abdomen and pelvis from 08/19/2022. FINDINGS: Minimal grade 1 retrolisthesis of L4 on L5. New but age indeterminate compression fracture of T12 results in roughly 60 percent loss of vertebral body height. Multilevel disc space  narrowing is unchanged significantly and moderate at L5-S1. There is vacuum phenomenon at L5-S1. Osteopenia. Intact pedicles. Numerous calculi in the right upper quadrant likely from cholelithiasis. IMPRESSION: New but age indeterminate compression  fracture of T12 results in roughly 60 percent loss of vertebral body height. Correlate with point tenderness and consider MRI. Electronically Signed   By: Bryon  Nastasi M.D.   On: 04/02/2023 09:29     Radiology CT Head Wo Contrast Result Date: 04/25/2023 CLINICAL DATA:  Level II trauma.  Blunt poly trauma. EXAM: CT HEAD WITHOUT CONTRAST CT CERVICAL SPINE WITHOUT CONTRAST TECHNIQUE: Multidetector CT imaging of the head and cervical spine was performed following the standard protocol without intravenous contrast. Multiplanar CT image reconstructions of the cervical spine were also generated. RADIATION DOSE REDUCTION: This exam was performed according to the departmental dose-optimization program which includes automated exposure control, adjustment of the mA and/or kV according to patient size and/or use of iterative reconstruction technique. COMPARISON:  Head CT 11/16/2022. FINDINGS: CT HEAD FINDINGS Brain: There is no evidence for acute hemorrhage, hydrocephalus, mass lesion, or abnormal extra-axial fluid collection. No definite CT evidence for acute infarction. Diffuse loss of parenchymal volume is consistent with atrophy. Patchy low attenuation in the deep hemispheric and periventricular white matter is nonspecific, but likely reflects chronic microvascular ischemic demyelination. Stable chronic infarct right basal ganglia. Tiny lacunar infarcts are seen in the left cerebellum. Vascular: No hyperdense vessel or unexpected calcification. Skull: No evidence for fracture. No worrisome lytic or sclerotic lesion. Sinuses/Orbits: Trace chronic mucosal disease posterior right ethmoid air cells. The visualized paranasal sinuses and mastoid air cells are otherwise clear.  Visualized portions of the globes and intraorbital fat are unremarkable. Other: None CT CERVICAL SPINE FINDINGS Alignment: No evidence for traumatic subluxation. Skull base and vertebrae: No acute fracture. No primary bone lesion or focal pathologic process. Soft tissues and spinal canal: No prevertebral fluid or swelling. No visible canal hematoma. Disc levels: There is diffuse loss of disc height in the cervical spine, most prominent at C5-6. Facets are well aligned bilaterally. Upper chest: Pleural fluid visible in the left lung apex suggest the presence of a large left pleural effusion. Other: None. IMPRESSION: 1. No acute intracranial abnormality. 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 2. Atrophy with chronic small vessel white matter ischemic disease. 3. No evidence for cervical spine fracture or traumatic subluxation. 4. Pleural fluid visible in the left lung apex suggest the presence of a large left pleural effusion. Electronically Signed   By: Donnal Fusi M.D.   On: 04/25/2023 05:24   CT Cervical Spine Wo Contrast Result Date: 04/25/2023 CLINICAL DATA:  Level II trauma.  Blunt poly trauma. EXAM: CT HEAD WITHOUT CONTRAST CT CERVICAL SPINE WITHOUT CONTRAST TECHNIQUE: Multidetector CT imaging of the head and cervical spine was performed following the standard protocol without intravenous contrast. Multiplanar CT image reconstructions of the cervical spine were also generated. RADIATION DOSE REDUCTION: This exam was performed according to the departmental dose-optimization program which includes automated exposure control, adjustment of the mA and/or kV according to patient size and/or use of iterative reconstruction technique. COMPARISON:  Head CT 11/16/2022. FINDINGS: CT HEAD FINDINGS Brain: There is no evidence for acute hemorrhage, hydrocephalus, mass lesion, or abnormal extra-axial fluid collection. No definite CT evidence for acute infarction. Diffuse loss of  parenchymal volume is consistent with atrophy. Patchy low attenuation in the deep hemispheric and periventricular white matter is nonspecific, but likely reflects chronic microvascular ischemic demyelination. Stable chronic infarct right basal ganglia. Tiny lacunar infarcts are seen in the left cerebellum. Vascular: No hyperdense vessel or unexpected calcification. Skull: No evidence for fracture. No worrisome lytic or sclerotic lesion. Sinuses/Orbits: Trace  chronic mucosal disease posterior right ethmoid air cells. The visualized paranasal sinuses and mastoid air cells are otherwise clear. Visualized portions of the globes and intraorbital fat are unremarkable. Other: None CT CERVICAL SPINE FINDINGS Alignment: No evidence for traumatic subluxation. Skull base and vertebrae: No acute fracture. No primary bone lesion or focal pathologic process. Soft tissues and spinal canal: No prevertebral fluid or swelling. No visible canal hematoma. Disc levels: There is diffuse loss of disc height in the cervical spine, most prominent at C5-6. Facets are well aligned bilaterally. Upper chest: Pleural fluid visible in the left lung apex suggest the presence of a large left pleural effusion. Other: None. IMPRESSION: 1. No acute intracranial abnormality. 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 2. Atrophy with chronic small vessel white matter ischemic disease. 3. No evidence for cervical spine fracture or traumatic subluxation. 4. Pleural fluid visible in the left lung apex suggest the presence of a large left pleural effusion. Electronically Signed   By: Kennith Center M.D.   On: 04/25/2023 05:24   DG Pelvis Portable Result Date: 04/25/2023 CLINICAL DATA:  Trauma due to fall EXAM: PORTABLE PELVIS 1 VIEWS COMPARISON:  None Available. FINDINGS: Generalized osteopenia. No evidence of fracture or hip dislocation. No pelvic ring diastasis. IMPRESSION: No acute finding. Electronically Signed   By:  Tiburcio Pea M.D.   On: 04/25/2023 05:15   DG Chest Portable 1 View Result Date: 04/25/2023 CLINICAL DATA:  Trauma with fall EXAM: PORTABLE CHEST 1 VIEW COMPARISON:  04/14/2023 FINDINGS: Cardiomegaly with vascular pedicle widening.  There has been CABG. Congested appearance of vessels with hazy density at the left base likely reflecting scar. No pneumothorax. No acute osseous finding. IMPRESSION: Stable cardiomegaly and vascular congestion. Electronically Signed   By: Tiburcio Pea M.D.   On: 04/25/2023 05:13    Procedures Procedures    Medications Ordered in ED Medications - No data to display  ED Course/ Medical Decision Making/ A&P                                 Medical Decision Making BIB EMS for fall on thinners   Amount and/or Complexity of Data Reviewed Independent Historian: EMS    Details: See above  External Data Reviewed: notes.    Details: Previous notes reviewed  Labs: ordered.    Details: Normal white count 9.3, low hemoglobin 10.4, normal platelets. Normal sodium 141, slight low potassium 3.4, normal creatinine  Radiology: ordered and independent interpretation performed.    Details: No PNA by me on CXR, no hip fractures     Final Clinical Impression(s) / ED Diagnoses Final diagnoses:  Pleural effusion  Fall, initial encounter   No signs of systemic illness or infection. The patient is nontoxic-appearing on exam and vital signs are within normal limits.  I have reviewed the triage vital signs and the nursing notes. Pertinent labs & imaging results that were available during my care of the patient were reviewed by me and considered in my medical decision making (see chart for details). After history, exam, and medical workup I feel the patient has been appropriately medically screened and is safe for discharge home. Pertinent diagnoses were discussed with the patient. Patient was given return precautions.    Rx / DC Orders ED Discharge Orders     None          Phallon Haydu, MD 04/25/23 [phone]

## 2023-04-25 NOTE — ED Triage Notes (Signed)
 Pt on eliquis BIBA from [facility] as Level 2 trauma with reports of unwitnessed fall with pain to back of head. Pt is on eliquis. Pt denies LOC but unsure how long they were on the floor before being found by staff. No visible injury noted. Pt Aox4. CBG 133.

## 2023-04-25 NOTE — ED Notes (Signed)
 Pt transported to CT via stretcher by [employer].

## 2023-04-25 NOTE — ED Notes (Signed)
 Trauma Response Nurse Documentation   Donald Rios is a 83 y.o. male arriving to [facility] via [facility]  On Eliquis (apixaban) daily. Trauma was activated as a Level 2 by Adair Actis RN based on the following trauma criteria Elderly patients > 65 with head trauma on anti-coagulation (excluding ASA).  Patient cleared for CT by Dr. Maralee Senate. Pt transported to CT with trauma response nurse present to monitor. RN remained with the patient throughout their absence from the department for clinical observation.   GCS 15.  Trauma MD Arrival Time: N/A.  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    History of MI (myocardial infarction)    History of nuclear stress test 08/07/2010   dipyridamole; EKG negative for ischemia, low risk scan    Hyperlipidemia    Hyperplastic colon polyp 05/2008   Hypertension    Ischemic cardiomyopathy    EF 45%, with inferior wall motion abnormality    Myocardial infarction (HCC)    Valvular regurgitation    mitral and tricuspid (mild)     Past Surgical History:  Procedure Laterality Date   ABDOMINAL AORTOGRAM W/LOWER EXTREMITY N/A 02/14/2023   Procedure: ABDOMINAL AORTOGRAM W/LOWER EXTREMITY;  Surgeon: Philipp Brawn, MD;  Location: [facility] INVASIVE CV LAB;  Service: Cardiovascular;  Laterality: N/A;   AMPUTATION Left 03/22/2023   Procedure: FIFTH TOE AMPUTATION LEFT FOOT;  Surgeon: Philipp Brawn, MD;  Location: [facility] OR;  Service: Vascular;  Laterality: Left;   CARDIOVERSION N/A 08/16/2022   Procedure:  CARDIOVERSION;  Surgeon: Hugh Madura, MD;  Location: [facility] INVASIVE CV LAB;  Service: Cardiovascular;  Laterality: N/A;   CORONARY ARTERY BYPASS GRAFT  2000   LIMA to LAD, free IMA to OM2, sequential graft to PLA & PLD   ESOPHAGOGASTRODUODENOSCOPY N/A 05/07/2013   Procedure: ESOPHAGOGASTRODUODENOSCOPY (EGD);  Surgeon: Kenney Peacemaker, MD;  Location: [facility] ENDOSCOPY;  Service: Endoscopy;  Laterality: N/A;   PERIPHERAL VASCULAR INTERVENTION Left 02/14/2023   Procedure: PERIPHERAL VASCULAR INTERVENTION;  Surgeon: Philipp Brawn, MD;  Location: [facility] INVASIVE CV LAB;  Service: Cardiovascular;  Laterality: Left;  SFA   TEE WITHOUT CARDIOVERSION N/A 08/16/2022   Procedure: TRANSESOPHAGEAL ECHOCARDIOGRAM;  Surgeon: Hugh Madura, MD;  Location: [facility] INVASIVE CV LAB;  Service: Cardiovascular;  Laterality: N/A;   TRANSTHORACIC ECHOCARDIOGRAM  09/07/2010   EF 45-50%; LV systolic function mildly reduced; LA mildly dilated; mild-mod MR & mild-mod TR; aortic root sclerosis/calcification;        Initial Focused Assessment (If applicable, or please see trauma documentation): Airway-- intact, no visible obstruction Breathing-- spontaneous, unlabored Circulation-- no obvious bleeding noted  CT's Completed:   CT Head and CT C-Spine   Interventions:  See event summary  Plan for disposition:  Discharge home   Consults completed:  none at 0604.  Event Summary: Patient brought in by [facility]. Patient found having fallen out of bed at facility. Patient arrives  alert and oriented, gcs 15. Manual BP obtained. Basic labs obtained. Xray chest and pelvis completed. Patient to CT with TRN. CT head and c-spine completed.   MTP Summary (If applicable):  N/A  Bedside handoff with ED RN Nerissa Bannister.    Brunetta Capes  Trauma Response RN  Please call TRN at [phone] for further assistance.

## 2023-04-26 ENCOUNTER — Telehealth: Payer: Self-pay

## 2023-04-26 DIAGNOSIS — K219 Gastro-esophageal reflux disease without esophagitis: Secondary | ICD-10-CM | POA: Diagnosis not present

## 2023-04-26 DIAGNOSIS — E43 Unspecified severe protein-calorie malnutrition: Secondary | ICD-10-CM | POA: Diagnosis not present

## 2023-04-26 DIAGNOSIS — I70245 Atherosclerosis of native arteries of left leg with ulceration of other part of foot: Secondary | ICD-10-CM | POA: Diagnosis not present

## 2023-04-26 DIAGNOSIS — D631 Anemia in chronic kidney disease: Secondary | ICD-10-CM | POA: Diagnosis not present

## 2023-04-26 DIAGNOSIS — A419 Sepsis, unspecified organism: Secondary | ICD-10-CM | POA: Diagnosis not present

## 2023-04-26 DIAGNOSIS — E785 Hyperlipidemia, unspecified: Secondary | ICD-10-CM | POA: Diagnosis not present

## 2023-04-26 DIAGNOSIS — I70244 Atherosclerosis of native arteries of left leg with ulceration of heel and midfoot: Secondary | ICD-10-CM | POA: Diagnosis not present

## 2023-04-26 DIAGNOSIS — M6281 Muscle weakness (generalized): Secondary | ICD-10-CM | POA: Diagnosis not present

## 2023-04-26 DIAGNOSIS — I110 Hypertensive heart disease with heart failure: Secondary | ICD-10-CM | POA: Diagnosis not present

## 2023-04-26 DIAGNOSIS — D849 Immunodeficiency, unspecified: Secondary | ICD-10-CM | POA: Diagnosis not present

## 2023-04-26 DIAGNOSIS — E118 Type 2 diabetes mellitus with unspecified complications: Secondary | ICD-10-CM | POA: Diagnosis not present

## 2023-04-26 DIAGNOSIS — E440 Moderate protein-calorie malnutrition: Secondary | ICD-10-CM | POA: Diagnosis not present

## 2023-04-26 DIAGNOSIS — L988 Other specified disorders of the skin and subcutaneous tissue: Secondary | ICD-10-CM | POA: Diagnosis not present

## 2023-04-26 DIAGNOSIS — Z89422 Acquired absence of other left toe(s): Secondary | ICD-10-CM | POA: Diagnosis not present

## 2023-04-26 DIAGNOSIS — R2689 Other abnormalities of gait and mobility: Secondary | ICD-10-CM | POA: Diagnosis not present

## 2023-04-26 DIAGNOSIS — I5022 Chronic systolic (congestive) heart failure: Secondary | ICD-10-CM | POA: Diagnosis not present

## 2023-04-26 DIAGNOSIS — R6520 Severe sepsis without septic shock: Secondary | ICD-10-CM | POA: Diagnosis not present

## 2023-04-26 DIAGNOSIS — I482 Chronic atrial fibrillation, unspecified: Secondary | ICD-10-CM | POA: Diagnosis not present

## 2023-04-26 DIAGNOSIS — L03116 Cellulitis of left lower limb: Secondary | ICD-10-CM | POA: Diagnosis not present

## 2023-04-26 DIAGNOSIS — L89153 Pressure ulcer of sacral region, stage 3: Secondary | ICD-10-CM | POA: Diagnosis not present

## 2023-04-26 NOTE — Transitions of Care (Post Inpatient/ED Visit) (Signed)
   04/26/2023  Name: Donald Rios MRN: [id number] DOB: 11-04-1940  Today's TOC FU Call Status: Today's TOC FU Call Status:: Unsuccessful Call (1st Attempt) Unsuccessful Call (1st Attempt) Date: 04/26/23  Attempted to reach the patient regarding the most recent Inpatient/ED visit.  Follow Up Plan: No further outreach attempts will be made at this time. We have been unable to contact the patient. Patient in SNF Signature Darrall Ellison, LPN [facility] Nurse Health Advisor Direct Dial [phone]

## 2023-04-26 NOTE — Telephone Encounter (Signed)
 Requested Prescriptions  Pending Prescriptions Disp Refills   allopurinol (ZYLOPRIM) 300 MG tablet [Pharmacy Med Name: ALLOPURINOL 300 MG TABLET] 90 tablet 0    Sig: TAKE 1 TABLET BY MOUTH DAILY     Endocrinology:  Gout Agents - allopurinol Failed - 04/26/2023 10:51 AM      Failed - Uric Acid in normal range and within 360 days    Uric Acid, Serum  Date Value Ref Range Status  04/11/2012 4.6 4.0 - 7.8 mg/dL Final         Failed - CBC within normal limits and completed in the last 12 months    WBC  Date Value Ref Range Status  04/25/2023 9.3 4.0 - 10.5 K/uL Final   RBC  Date Value Ref Range Status  04/25/2023 3.35 (L) 4.22 - 5.81 MIL/uL Final   Hemoglobin  Date Value Ref Range Status  04/25/2023 11.6 (L) 13.0 - 17.0 g/dL Final  16/10/9602 54.0 (L) 13.0 - 17.7 g/dL Final   Total hemoglobin  Date Value Ref Range Status  04/14/2023 8.0 (L) 12.0 - 16.0 g/dL Final   HCT  Date Value Ref Range Status  04/25/2023 34.0 (L) 39.0 - 52.0 % Final   Hematocrit  Date Value Ref Range Status  09/24/2022 39.9 37.5 - 51.0 % Final   MCHC  Date Value Ref Range Status  04/25/2023 30.6 30.0 - 36.0 g/dL Final   [facility]  Date Value Ref Range Status  04/25/2023 31.0 26.0 - 34.0 pg Final   MCV  Date Value Ref Range Status  04/25/2023 101.5 (H) 80.0 - 100.0 fL Final  09/24/2022 103 (H) 79 - 97 fL Final   No results found for: "PLTCOUNTKUC", "LABPLAT", "POCPLA" RDW  Date Value Ref Range Status  04/25/2023 19.3 (H) 11.5 - 15.5 % Final  09/24/2022 16.1 (H) 11.6 - 15.4 % Final         Passed - Cr in normal range and within 360 days    Creat  Date Value Ref Range Status  03/07/2023 1.24 (H) 0.70 - 1.22 mg/dL Final   Creatinine, Ser  Date Value Ref Range Status  04/25/2023 1.00 0.61 - 1.24 mg/dL Final   Creatinine, Urine  Date Value Ref Range Status  11/13/2021 94 20 - 320 mg/dL Final         Passed - Valid encounter within last 12 months    Recent Outpatient Visits           1  month ago Acute midline low back pain without sciatica   [facility] Austine Lefort, MD   3 months ago Ulcerated, foot, left, with fat layer exposed [facility])   [place] [facility] Austine Lefort, MD   3 months ago Ulcerated, foot, left, with fat layer exposed [facility])   [place] [facility] Austine Lefort, MD   3 months ago Ulcerated, foot, left, with fat layer exposed [facility])   [place] [facility] Austine Lefort, MD   3 months ago Ulcerated, foot, left, with fat layer exposed [facility])   [place] [facility] Austine Lefort, MD       Future Appointments             In 2 weeks Cleaver, Chet Cota, NP [facility]   In 1 month Rice, Haig Levan, MD [facility] - [facility] Dept Of [place]  In 3 months Hilty, Aviva Lemmings, MD [facility] at [facility]

## 2023-04-27 DIAGNOSIS — E118 Type 2 diabetes mellitus with unspecified complications: Secondary | ICD-10-CM | POA: Diagnosis not present

## 2023-04-27 DIAGNOSIS — E785 Hyperlipidemia, unspecified: Secondary | ICD-10-CM | POA: Diagnosis not present

## 2023-04-27 DIAGNOSIS — I482 Chronic atrial fibrillation, unspecified: Secondary | ICD-10-CM | POA: Diagnosis not present

## 2023-04-27 DIAGNOSIS — L03116 Cellulitis of left lower limb: Secondary | ICD-10-CM | POA: Diagnosis not present

## 2023-04-27 DIAGNOSIS — E43 Unspecified severe protein-calorie malnutrition: Secondary | ICD-10-CM | POA: Diagnosis not present

## 2023-04-27 DIAGNOSIS — J449 Chronic obstructive pulmonary disease, unspecified: Secondary | ICD-10-CM | POA: Diagnosis not present

## 2023-04-27 DIAGNOSIS — I110 Hypertensive heart disease with heart failure: Secondary | ICD-10-CM | POA: Diagnosis not present

## 2023-04-27 DIAGNOSIS — K219 Gastro-esophageal reflux disease without esophagitis: Secondary | ICD-10-CM | POA: Diagnosis not present

## 2023-04-27 DIAGNOSIS — I5022 Chronic systolic (congestive) heart failure: Secondary | ICD-10-CM | POA: Diagnosis not present

## 2023-04-27 DIAGNOSIS — D631 Anemia in chronic kidney disease: Secondary | ICD-10-CM | POA: Diagnosis not present

## 2023-04-27 DIAGNOSIS — R6520 Severe sepsis without septic shock: Secondary | ICD-10-CM | POA: Diagnosis not present

## 2023-04-27 DIAGNOSIS — Z89422 Acquired absence of other left toe(s): Secondary | ICD-10-CM | POA: Diagnosis not present

## 2023-04-29 DIAGNOSIS — E440 Moderate protein-calorie malnutrition: Secondary | ICD-10-CM | POA: Diagnosis not present

## 2023-04-29 DIAGNOSIS — E118 Type 2 diabetes mellitus with unspecified complications: Secondary | ICD-10-CM | POA: Diagnosis not present

## 2023-04-29 DIAGNOSIS — D631 Anemia in chronic kidney disease: Secondary | ICD-10-CM | POA: Diagnosis not present

## 2023-04-29 DIAGNOSIS — J449 Chronic obstructive pulmonary disease, unspecified: Secondary | ICD-10-CM | POA: Diagnosis not present

## 2023-04-29 DIAGNOSIS — L03116 Cellulitis of left lower limb: Secondary | ICD-10-CM | POA: Diagnosis not present

## 2023-04-29 DIAGNOSIS — I482 Chronic atrial fibrillation, unspecified: Secondary | ICD-10-CM | POA: Diagnosis not present

## 2023-04-29 DIAGNOSIS — A419 Sepsis, unspecified organism: Secondary | ICD-10-CM | POA: Diagnosis not present

## 2023-04-29 DIAGNOSIS — Z89422 Acquired absence of other left toe(s): Secondary | ICD-10-CM | POA: Diagnosis not present

## 2023-04-29 DIAGNOSIS — E43 Unspecified severe protein-calorie malnutrition: Secondary | ICD-10-CM | POA: Diagnosis not present

## 2023-04-29 DIAGNOSIS — D849 Immunodeficiency, unspecified: Secondary | ICD-10-CM | POA: Diagnosis not present

## 2023-04-29 DIAGNOSIS — I5022 Chronic systolic (congestive) heart failure: Secondary | ICD-10-CM | POA: Diagnosis not present

## 2023-04-29 DIAGNOSIS — R2689 Other abnormalities of gait and mobility: Secondary | ICD-10-CM | POA: Diagnosis not present

## 2023-04-29 DIAGNOSIS — K219 Gastro-esophageal reflux disease without esophagitis: Secondary | ICD-10-CM | POA: Diagnosis not present

## 2023-04-29 DIAGNOSIS — R6520 Severe sepsis without septic shock: Secondary | ICD-10-CM | POA: Diagnosis not present

## 2023-04-29 DIAGNOSIS — I739 Peripheral vascular disease, unspecified: Secondary | ICD-10-CM | POA: Diagnosis not present

## 2023-04-29 DIAGNOSIS — M6281 Muscle weakness (generalized): Secondary | ICD-10-CM | POA: Diagnosis not present

## 2023-04-29 DIAGNOSIS — I110 Hypertensive heart disease with heart failure: Secondary | ICD-10-CM | POA: Diagnosis not present

## 2023-05-03 ENCOUNTER — Encounter (HOSPITAL_BASED_OUTPATIENT_CLINIC_OR_DEPARTMENT_OTHER): Admitting: General Surgery

## 2023-05-03 ENCOUNTER — Ambulatory Visit: Payer: Self-pay

## 2023-05-03 DIAGNOSIS — I5022 Chronic systolic (congestive) heart failure: Secondary | ICD-10-CM | POA: Diagnosis not present

## 2023-05-03 DIAGNOSIS — I70244 Atherosclerosis of native arteries of left leg with ulceration of heel and midfoot: Secondary | ICD-10-CM | POA: Diagnosis not present

## 2023-05-03 DIAGNOSIS — D849 Immunodeficiency, unspecified: Secondary | ICD-10-CM | POA: Diagnosis not present

## 2023-05-03 DIAGNOSIS — L988 Other specified disorders of the skin and subcutaneous tissue: Secondary | ICD-10-CM | POA: Diagnosis not present

## 2023-05-03 DIAGNOSIS — E440 Moderate protein-calorie malnutrition: Secondary | ICD-10-CM | POA: Diagnosis not present

## 2023-05-03 DIAGNOSIS — L03116 Cellulitis of left lower limb: Secondary | ICD-10-CM | POA: Diagnosis not present

## 2023-05-03 DIAGNOSIS — M6281 Muscle weakness (generalized): Secondary | ICD-10-CM | POA: Diagnosis not present

## 2023-05-03 DIAGNOSIS — I70245 Atherosclerosis of native arteries of left leg with ulceration of other part of foot: Secondary | ICD-10-CM | POA: Diagnosis not present

## 2023-05-03 DIAGNOSIS — L89153 Pressure ulcer of sacral region, stage 3: Secondary | ICD-10-CM | POA: Diagnosis not present

## 2023-05-03 DIAGNOSIS — A419 Sepsis, unspecified organism: Secondary | ICD-10-CM | POA: Diagnosis not present

## 2023-05-03 DIAGNOSIS — I482 Chronic atrial fibrillation, unspecified: Secondary | ICD-10-CM | POA: Diagnosis not present

## 2023-05-03 DIAGNOSIS — R2689 Other abnormalities of gait and mobility: Secondary | ICD-10-CM | POA: Diagnosis not present

## 2023-05-03 NOTE — Telephone Encounter (Signed)
 Chief Complaint: Back pain  Symptoms: Pain Frequency:  Pertinent Negatives: Patient denies new symptoms  Disposition: [] ED /[] Urgent Care (no appt availability in office) / [] Appointment(In office/virtual)/ []  [place] [facility]/ [] Home Care/ [] Refused Recommended Disposition /[] [place] [facility]/ []  Follow-up with PCP Additional Notes:  Donald Rios about 5 weeks ago, he had an MRI. He is currently inpatient at [facility]. He is calling to let Dr. Cheril Cork know he cannot do rehab exercises due to the pain. They are giving him pain medication but not helpful, he states he let the rehab provider know but they never got back to him. This morning they tried medication before PT but he states it was to painful and rehab is not helping with his back pain. He would like to know Dr. Cheril Cork recommendations, states they talked about "cement" in his back. Donald Rios is requesting Dr. Cheril Cork to call wife Donald Rios [phone] for recommendations.    Copied from CRM [id number]. Topic: Clinical - Red Word Triage >> May 03, 2023  5:15 PM Donald Rios wrote: Red Word that prompted transfer to Nurse Triage: Pain- Pt state his back been in so much pain Reason for Disposition  [1] SEVERE back pain (e.g., excruciating, unable to do any normal activities) AND [2] not improved 2 hours after pain medicine  Protocols used: Back Pain-A-AH

## 2023-05-04 DIAGNOSIS — I110 Hypertensive heart disease with heart failure: Secondary | ICD-10-CM | POA: Diagnosis not present

## 2023-05-04 DIAGNOSIS — I5022 Chronic systolic (congestive) heart failure: Secondary | ICD-10-CM | POA: Diagnosis not present

## 2023-05-04 DIAGNOSIS — D631 Anemia in chronic kidney disease: Secondary | ICD-10-CM | POA: Diagnosis not present

## 2023-05-04 DIAGNOSIS — E43 Unspecified severe protein-calorie malnutrition: Secondary | ICD-10-CM | POA: Diagnosis not present

## 2023-05-04 DIAGNOSIS — Z89422 Acquired absence of other left toe(s): Secondary | ICD-10-CM | POA: Diagnosis not present

## 2023-05-04 DIAGNOSIS — R6520 Severe sepsis without septic shock: Secondary | ICD-10-CM | POA: Diagnosis not present

## 2023-05-04 DIAGNOSIS — E118 Type 2 diabetes mellitus with unspecified complications: Secondary | ICD-10-CM | POA: Diagnosis not present

## 2023-05-04 DIAGNOSIS — K219 Gastro-esophageal reflux disease without esophagitis: Secondary | ICD-10-CM | POA: Diagnosis not present

## 2023-05-04 DIAGNOSIS — M316 Other giant cell arteritis: Secondary | ICD-10-CM | POA: Diagnosis not present

## 2023-05-04 DIAGNOSIS — I482 Chronic atrial fibrillation, unspecified: Secondary | ICD-10-CM | POA: Diagnosis not present

## 2023-05-04 DIAGNOSIS — L03116 Cellulitis of left lower limb: Secondary | ICD-10-CM | POA: Diagnosis not present

## 2023-05-04 DIAGNOSIS — S22089A Unspecified fracture of T11-T12 vertebra, initial encounter for closed fracture: Secondary | ICD-10-CM | POA: Diagnosis not present

## 2023-05-06 ENCOUNTER — Other Ambulatory Visit: Admitting: *Deleted

## 2023-05-06 DIAGNOSIS — A419 Sepsis, unspecified organism: Secondary | ICD-10-CM | POA: Diagnosis not present

## 2023-05-06 DIAGNOSIS — D849 Immunodeficiency, unspecified: Secondary | ICD-10-CM | POA: Diagnosis not present

## 2023-05-06 DIAGNOSIS — E440 Moderate protein-calorie malnutrition: Secondary | ICD-10-CM | POA: Diagnosis not present

## 2023-05-06 DIAGNOSIS — L03116 Cellulitis of left lower limb: Secondary | ICD-10-CM | POA: Diagnosis not present

## 2023-05-06 DIAGNOSIS — I482 Chronic atrial fibrillation, unspecified: Secondary | ICD-10-CM | POA: Diagnosis not present

## 2023-05-06 DIAGNOSIS — M6281 Muscle weakness (generalized): Secondary | ICD-10-CM | POA: Diagnosis not present

## 2023-05-06 DIAGNOSIS — I5022 Chronic systolic (congestive) heart failure: Secondary | ICD-10-CM | POA: Diagnosis not present

## 2023-05-06 DIAGNOSIS — R2689 Other abnormalities of gait and mobility: Secondary | ICD-10-CM | POA: Diagnosis not present

## 2023-05-09 DIAGNOSIS — E43 Unspecified severe protein-calorie malnutrition: Secondary | ICD-10-CM | POA: Diagnosis not present

## 2023-05-09 DIAGNOSIS — E118 Type 2 diabetes mellitus with unspecified complications: Secondary | ICD-10-CM | POA: Diagnosis not present

## 2023-05-09 DIAGNOSIS — I110 Hypertensive heart disease with heart failure: Secondary | ICD-10-CM | POA: Diagnosis not present

## 2023-05-09 DIAGNOSIS — I959 Hypotension, unspecified: Secondary | ICD-10-CM | POA: Diagnosis not present

## 2023-05-09 DIAGNOSIS — I482 Chronic atrial fibrillation, unspecified: Secondary | ICD-10-CM | POA: Diagnosis not present

## 2023-05-09 DIAGNOSIS — D631 Anemia in chronic kidney disease: Secondary | ICD-10-CM | POA: Diagnosis not present

## 2023-05-09 DIAGNOSIS — L03116 Cellulitis of left lower limb: Secondary | ICD-10-CM | POA: Diagnosis not present

## 2023-05-09 DIAGNOSIS — R627 Adult failure to thrive: Secondary | ICD-10-CM | POA: Diagnosis not present

## 2023-05-09 DIAGNOSIS — E785 Hyperlipidemia, unspecified: Secondary | ICD-10-CM | POA: Diagnosis not present

## 2023-05-09 DIAGNOSIS — I10 Essential (primary) hypertension: Secondary | ICD-10-CM | POA: Diagnosis not present

## 2023-05-09 DIAGNOSIS — K219 Gastro-esophageal reflux disease without esophagitis: Secondary | ICD-10-CM | POA: Diagnosis not present

## 2023-05-09 DIAGNOSIS — I5022 Chronic systolic (congestive) heart failure: Secondary | ICD-10-CM | POA: Diagnosis not present

## 2023-05-10 ENCOUNTER — Telehealth: Payer: Self-pay

## 2023-05-10 ENCOUNTER — Other Ambulatory Visit: Payer: Self-pay | Admitting: *Deleted

## 2023-05-10 ENCOUNTER — Ambulatory Visit: Admitting: Internal Medicine

## 2023-05-10 NOTE — Patient Outreach (Signed)
 Per [facility] Mr. Prouse was discharged from [facility] SNF on 05/09/23. Mr. Campisano was active with [facility] CCM team prior.   Secure message sent to [facility] SNF social worker to inquire about home health arrangements.   Will update [facility] RN CM.   Nolberto Batty, MSN, RN, BSN [place]  [facility] RN Post- Acute Care Manager Direct Dial: [phone]

## 2023-05-10 NOTE — Transitions of Care (Post Inpatient/ED Visit) (Unsigned)
   05/10/2023  Name: Donald Rios MRN: [id number] DOB: January 07, 1941  Today's TOC FU Call Status: Today's TOC FU Call Status:: Unsuccessful Call (1st Attempt) Unsuccessful Call (1st Attempt) Date: 05/10/23  Attempted to reach the patient regarding the most recent Inpatient/ED visit.  Follow Up Plan: Additional outreach attempts will be made to reach the patient to complete the Transitions of Care (Post Inpatient/ED visit) call.   Signature Darrall Ellison, LPN [facility] Nurse Health Advisor Direct Dial [phone]

## 2023-05-10 NOTE — Telephone Encounter (Signed)
 Copied from CRM [id number]. Topic: Clinical - Medical Advice >> May 10, 2023  9:18 AM Carlatta H wrote: Reason for CRM: patient received a call from Nurse [facility] and would like a call back

## 2023-05-11 ENCOUNTER — Telehealth: Payer: Self-pay

## 2023-05-11 NOTE — Telephone Encounter (Signed)
 Verbal orders given for okay for start of care. Mjp,lpn  Copied from CRM [id number]. Topic: Clinical - Home Health Verbal Orders >> May 11, 2023  2:44 PM Everette C wrote: Caller/Agency: Charlynn Coombe / [facility]  Callback Number: [phone] Service Requested: Skilled Nursing, PT and OT    Charlynn Coombe has called to request a delay in start of care until 05/12/22

## 2023-05-11 NOTE — Transitions of Care (Post Inpatient/ED Visit) (Signed)
 05/11/2023  Name: Donald Rios MRN: [id number] DOB: Jun 22, 1940  Today's TOC FU Call Status: Today's TOC FU Call Status:: Successful TOC FU Call Completed Unsuccessful Call (1st Attempt) Date: 05/10/23 [facility] FU Call Complete Date: 05/11/23 Patient's Name and Date of Birth confirmed.  Transition Care Management Follow-up Telephone Call Date of Discharge: 05/09/23 Discharge Facility: Other [facility]) Name of Other (Non[facility]) Discharge Facility: [facility] Type of Discharge: Inpatient Admission Primary Inpatient Discharge Diagnosis:: osteoarthritis How have you been since you were released from the hospital?: Better Any questions or concerns?: No  Items Reviewed: Did you receive and understand the discharge instructions provided?: Yes Medications obtained,verified, and reconciled?: Yes (Medications Reviewed) Any new allergies since your discharge?: No Dietary orders reviewed?: Yes Do you have support at home?: Yes People in Home [RPT]: spouse  Medications Reviewed Today: Medications Reviewed Today     Reviewed by Darrall Ellison, LPN (Licensed Practical Nurse) on 05/11/23 at 1154  Med List Status: <None>   Medication Order Taking? Sig Documenting Provider Last Dose Status Informant  acetaminophen  (TYLENOL ) 325 MG tablet [id number]  Take 2 tablets (650 mg total) by mouth every 6 (six) hours as needed for mild pain (pain score 1-3) (or Fever >/= 101). Hongalgi, Anand D, MD  Active   allopurinol  (ZYLOPRIM ) 300 MG tablet [id number]  TAKE 1 TABLET BY MOUTH DAILY Austine Lefort, MD  Active   amiodarone  (PACERONE ) 200 MG tablet [id number]  Take 1 tablet (200 mg total) by mouth every morning. Hongalgi, Anand D, MD  Active   ascorbic acid  (VITAMIN C ) 250 MG tablet [id number]  Take 1 tablet (250 mg total) by mouth 2 (two) times daily. Hongalgi, Anand D, MD  Active   atorvastatin  (LIPITOR) 40 MG tablet [id number]  Take 1 tablet (40 mg total) by mouth daily. Hongalgi, Anand D, MD   Active   bisacodyl  (DULCOLAX) 10 MG suppository [id number]  Place 1 suppository (10 mg total) rectally daily as needed for moderate constipation. Hongalgi, Anand D, MD  Active   cholecalciferol  (CHOLECALCIFEROL ) 25 MCG tablet [id number]  Take 1 tablet (1,000 Units total) by mouth daily. Hongalgi, Anand D, MD  Active   cyanocobalamin  (VITAMIN B12) 500 MCG tablet [id number]  Take 1 tablet (500 mcg total) by mouth daily. Hongalgi, Anand D, MD  Active   digoxin  (LANOXIN ) 0.125 MG tablet [id number]  Take 1 tablet (0.125 mg total) by mouth daily. Hongalgi, Anand D, MD  Active   ELIQUIS  5 MG TABS tablet [id number] No TAKE 1 TABLET BY MOUTH 2 TIMES A DAY Austine Lefort, MD 04/11/2023  9:20 AM Active Self, Pharmacy Records, Spouse/Significant Other  empagliflozin  (JARDIANCE ) 10 MG TABS tablet [id number] No Take 1 tablet (10 mg total) by mouth daily.  Patient taking differently: Take 10 mg by mouth in the morning.   Sanjuanita Cruz, NP 04/11/2023 Active Self, Pharmacy Records, Spouse/Significant Other  feeding supplement (ENSURE ENLIVE / ENSURE PLUS) LIQD [id number]  Take 237 mLs by mouth 3 (three) times daily between meals. Hongalgi, Anand D, MD  Active   fludrocortisone  (FLORINEF ) 0.1 MG tablet [id number]  Take 2 tablets (0.2 mg total) by mouth 2 (two) times daily. Hongalgi, Anand D, MD  Active   insulin  aspart (NOVOLOG ) 100 UNIT/ML injection [id number]  Inject 0-9 Units into the skin 3 (three) times daily with meals. 0-9 Units, Subcutaneous, 3 times daily with meals, First dose on Wed 04/13/23 at 0800 Correction coverage: Sensitive (thin, NPO, renal) CBG < 70: Implement Hypoglycemia protocol, CBG 70 -  120: 0 units CBG 121 - 150: 1 unit CBG 151 - 200: 2 units CBG 201 - 250: 3 units CBG 251 - 300: 5 units CBG 301 - 350: 7 units CBG 351 - 400: 9 units CBG > 400: call MD. Hongalgi, Anand D, MD  Active   metoprolol  tartrate (LOPRESSOR ) 50 MG tablet [id number]  Take 1.5 tablets (75 mg total) by mouth 2 (two) times daily.  Hongalgi, Anand D, MD  Active   midodrine  (PROAMATINE ) 5 MG tablet [id number]  Take 3 tablets (15 mg total) by mouth 3 (three) times daily with meals. Hongalgi, Anand D, MD  Active   mirtazapine  (REMERON ) 30 MG tablet [id number] No TAKE 1 TABLET BY MOUTH EVERY NIGHT AT BEDTIME FOR APPETITE  Patient taking differently: Take 30 mg by mouth at bedtime. TAKE 1 TABLET BY MOUTH EVERY NIGHT AT BEDTIME FOR APPETITE   Austine Lefort, MD 04/10/2023 Active Self, Pharmacy Records, Spouse/Significant Other  Multiple Vitamin (MULTIVITAMIN WITH MINERALS) TABS tablet [id number]  Take 1 tablet by mouth daily. Hongalgi, Anand D, MD  Active   pantoprazole  (PROTONIX ) 40 MG tablet [id number] No TAKE 1 TABLET BY MOUTH DAILY  Patient taking differently: Take 40 mg by mouth in the morning.   Austine Lefort, MD 04/11/2023 Active Self, Pharmacy Records, Spouse/Significant Other  polyethylene glycol (MIRALAX  / GLYCOLAX ) 17 g packet [id number]  Take 17 g by mouth daily. Hongalgi, Anand D, MD  Active   senna-docusate (SENOKOT-S) 8.6-50 MG tablet [id number]  Take 1 tablet by mouth at bedtime. Hongalgi, Anand D, MD  Active   thiamine  (VITAMIN B-1) 100 MG tablet [id number]  Take 1 tablet (100 mg total) by mouth daily. Hongalgi, Anand D, MD  Active   Tocilizumab  (ACTEMRA  IV) [id number] No Inject 6 mg/kg into the vein every 28 (twenty-eight) days. [provider] Unknown Active Self, Pharmacy Records, Spouse/Significant Other           Med Note Mliss Anderson RODRIGUEZ, SUSAN A   Tue Apr 12, 2023  3:53 PM) Patient unsure of last infusion date.   vitamin A  3 MG (10000 UNITS) capsule [id number]  Take 1 capsule (10,000 Units total) by mouth daily. Hongalgi, Anand D, MD  Active   zinc  sulfate, 50mg  elemental zinc , 220 (50 Zn) MG capsule [id number]  Take 1 capsule (220 mg total) by mouth daily. Casey Clay, MD  Active             Home Care and Equipment/Supplies: Were Home Health Services Ordered?: Yes Name of Home Health  Agency:: unknown Has Agency set up a time to come to your home?: No Any new equipment or medical supplies ordered?: NA  Functional Questionnaire: Do you need assistance with bathing/showering or dressing?: No Do you need assistance with meal preparation?: No Do you need assistance with eating?: No Do you have difficulty maintaining continence: No Do you need assistance with getting out of bed/getting out of a chair/moving?: No Do you have difficulty managing or taking your medications?: No  Follow up appointments reviewed: PCP Follow-up appointment confirmed?: Yes Date of PCP follow-up appointment?: 05/12/23 Follow-up Provider: [facility] Follow-up appointment confirmed?: NA Do you need transportation to your follow-up appointment?: No Do you understand care options if your condition(s) worsen?: Yes-patient verbalized understanding    SIGNATURE Darrall Ellison, LPN [facility] Nurse Health Advisor Direct Dial [phone]

## 2023-05-12 ENCOUNTER — Ambulatory Visit: Admitting: General Practice

## 2023-05-12 ENCOUNTER — Ambulatory Visit: Admitting: Family Medicine

## 2023-05-12 ENCOUNTER — Emergency Department (HOSPITAL_COMMUNITY)

## 2023-05-12 ENCOUNTER — Inpatient Hospital Stay (HOSPITAL_COMMUNITY)
Admission: EM | Admit: 2023-05-12 | Discharge: 2023-06-12 | DRG: 871 | Disposition: E | Attending: Pulmonary Disease | Admitting: Pulmonary Disease

## 2023-05-12 ENCOUNTER — Other Ambulatory Visit: Payer: Self-pay

## 2023-05-12 ENCOUNTER — Encounter (HOSPITAL_COMMUNITY): Payer: Self-pay | Admitting: Family Medicine

## 2023-05-12 ENCOUNTER — Encounter (HOSPITAL_COMMUNITY): Admission: EM | Disposition: E | Payer: Self-pay | Source: Home / Self Care | Attending: Pulmonary Disease

## 2023-05-12 DIAGNOSIS — Z66 Do not resuscitate: Secondary | ICD-10-CM | POA: Diagnosis not present

## 2023-05-12 DIAGNOSIS — I482 Chronic atrial fibrillation, unspecified: Secondary | ICD-10-CM | POA: Diagnosis not present

## 2023-05-12 DIAGNOSIS — Z95828 Presence of other vascular implants and grafts: Secondary | ICD-10-CM | POA: Diagnosis not present

## 2023-05-12 DIAGNOSIS — Z7952 Long term (current) use of systemic steroids: Secondary | ICD-10-CM

## 2023-05-12 DIAGNOSIS — Z7901 Long term (current) use of anticoagulants: Secondary | ICD-10-CM | POA: Diagnosis not present

## 2023-05-12 DIAGNOSIS — R404 Transient alteration of awareness: Secondary | ICD-10-CM | POA: Diagnosis not present

## 2023-05-12 DIAGNOSIS — Z87891 Personal history of nicotine dependence: Secondary | ICD-10-CM

## 2023-05-12 DIAGNOSIS — Z681 Body mass index (BMI) 19 or less, adult: Secondary | ICD-10-CM

## 2023-05-12 DIAGNOSIS — E785 Hyperlipidemia, unspecified: Secondary | ICD-10-CM | POA: Diagnosis present

## 2023-05-12 DIAGNOSIS — E861 Hypovolemia: Secondary | ICD-10-CM | POA: Diagnosis present

## 2023-05-12 DIAGNOSIS — J90 Pleural effusion, not elsewhere classified: Secondary | ICD-10-CM | POA: Diagnosis present

## 2023-05-12 DIAGNOSIS — E440 Moderate protein-calorie malnutrition: Secondary | ICD-10-CM

## 2023-05-12 DIAGNOSIS — J9690 Respiratory failure, unspecified, unspecified whether with hypoxia or hypercapnia: Secondary | ICD-10-CM | POA: Diagnosis not present

## 2023-05-12 DIAGNOSIS — I509 Heart failure, unspecified: Secondary | ICD-10-CM | POA: Diagnosis not present

## 2023-05-12 DIAGNOSIS — E162 Hypoglycemia, unspecified: Secondary | ICD-10-CM | POA: Diagnosis present

## 2023-05-12 DIAGNOSIS — R627 Adult failure to thrive: Secondary | ICD-10-CM | POA: Diagnosis present

## 2023-05-12 DIAGNOSIS — E1152 Type 2 diabetes mellitus with diabetic peripheral angiopathy with gangrene: Secondary | ICD-10-CM | POA: Diagnosis present

## 2023-05-12 DIAGNOSIS — I2489 Other forms of acute ischemic heart disease: Secondary | ICD-10-CM | POA: Diagnosis not present

## 2023-05-12 DIAGNOSIS — I70245 Atherosclerosis of native arteries of left leg with ulceration of other part of foot: Secondary | ICD-10-CM | POA: Diagnosis not present

## 2023-05-12 DIAGNOSIS — L03116 Cellulitis of left lower limb: Secondary | ICD-10-CM | POA: Diagnosis present

## 2023-05-12 DIAGNOSIS — E876 Hypokalemia: Secondary | ICD-10-CM | POA: Diagnosis present

## 2023-05-12 DIAGNOSIS — J9600 Acute respiratory failure, unspecified whether with hypoxia or hypercapnia: Secondary | ICD-10-CM | POA: Diagnosis not present

## 2023-05-12 DIAGNOSIS — L89153 Pressure ulcer of sacral region, stage 3: Secondary | ICD-10-CM | POA: Diagnosis not present

## 2023-05-12 DIAGNOSIS — R7982 Elevated C-reactive protein (CRP): Secondary | ICD-10-CM | POA: Diagnosis present

## 2023-05-12 DIAGNOSIS — Z7984 Long term (current) use of oral hypoglycemic drugs: Secondary | ICD-10-CM

## 2023-05-12 DIAGNOSIS — Z95820 Peripheral vascular angioplasty status with implants and grafts: Secondary | ICD-10-CM

## 2023-05-12 DIAGNOSIS — M316 Other giant cell arteritis: Secondary | ICD-10-CM | POA: Diagnosis not present

## 2023-05-12 DIAGNOSIS — R001 Bradycardia, unspecified: Secondary | ICD-10-CM | POA: Diagnosis present

## 2023-05-12 DIAGNOSIS — E11649 Type 2 diabetes mellitus with hypoglycemia without coma: Secondary | ICD-10-CM | POA: Diagnosis not present

## 2023-05-12 DIAGNOSIS — L98499 Non-pressure chronic ulcer of skin of other sites with unspecified severity: Secondary | ICD-10-CM | POA: Diagnosis not present

## 2023-05-12 DIAGNOSIS — R531 Weakness: Secondary | ICD-10-CM | POA: Diagnosis not present

## 2023-05-12 DIAGNOSIS — L89893 Pressure ulcer of other site, stage 3: Secondary | ICD-10-CM | POA: Diagnosis not present

## 2023-05-12 DIAGNOSIS — G9341 Metabolic encephalopathy: Secondary | ICD-10-CM | POA: Diagnosis not present

## 2023-05-12 DIAGNOSIS — J1282 Pneumonia due to coronavirus disease 2019: Secondary | ICD-10-CM | POA: Diagnosis not present

## 2023-05-12 DIAGNOSIS — R700 Elevated erythrocyte sedimentation rate: Secondary | ICD-10-CM | POA: Diagnosis present

## 2023-05-12 DIAGNOSIS — Z89422 Acquired absence of other left toe(s): Secondary | ICD-10-CM | POA: Diagnosis not present

## 2023-05-12 DIAGNOSIS — E161 Other hypoglycemia: Secondary | ICD-10-CM | POA: Diagnosis not present

## 2023-05-12 DIAGNOSIS — D638 Anemia in other chronic diseases classified elsewhere: Secondary | ICD-10-CM | POA: Diagnosis present

## 2023-05-12 DIAGNOSIS — I081 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: Secondary | ICD-10-CM | POA: Diagnosis not present

## 2023-05-12 DIAGNOSIS — I25810 Atherosclerosis of coronary artery bypass graft(s) without angina pectoris: Secondary | ICD-10-CM | POA: Diagnosis not present

## 2023-05-12 DIAGNOSIS — I2510 Atherosclerotic heart disease of native coronary artery without angina pectoris: Secondary | ICD-10-CM | POA: Diagnosis present

## 2023-05-12 DIAGNOSIS — R6521 Severe sepsis with septic shock: Secondary | ICD-10-CM | POA: Diagnosis not present

## 2023-05-12 DIAGNOSIS — R7989 Other specified abnormal findings of blood chemistry: Secondary | ICD-10-CM | POA: Diagnosis present

## 2023-05-12 DIAGNOSIS — D631 Anemia in chronic kidney disease: Secondary | ICD-10-CM | POA: Diagnosis present

## 2023-05-12 DIAGNOSIS — Z794 Long term (current) use of insulin: Secondary | ICD-10-CM

## 2023-05-12 DIAGNOSIS — I5020 Unspecified systolic (congestive) heart failure: Secondary | ICD-10-CM | POA: Diagnosis not present

## 2023-05-12 DIAGNOSIS — H9190 Unspecified hearing loss, unspecified ear: Secondary | ICD-10-CM | POA: Diagnosis present

## 2023-05-12 DIAGNOSIS — I4891 Unspecified atrial fibrillation: Secondary | ICD-10-CM | POA: Diagnosis not present

## 2023-05-12 DIAGNOSIS — J984 Other disorders of lung: Secondary | ICD-10-CM | POA: Diagnosis not present

## 2023-05-12 DIAGNOSIS — R4182 Altered mental status, unspecified: Secondary | ICD-10-CM | POA: Diagnosis not present

## 2023-05-12 DIAGNOSIS — E119 Type 2 diabetes mellitus without complications: Secondary | ICD-10-CM

## 2023-05-12 DIAGNOSIS — E1169 Type 2 diabetes mellitus with other specified complication: Secondary | ICD-10-CM | POA: Diagnosis present

## 2023-05-12 DIAGNOSIS — J918 Pleural effusion in other conditions classified elsewhere: Secondary | ICD-10-CM | POA: Diagnosis present

## 2023-05-12 DIAGNOSIS — Z515 Encounter for palliative care: Secondary | ICD-10-CM | POA: Diagnosis not present

## 2023-05-12 DIAGNOSIS — S40021A Contusion of right upper arm, initial encounter: Secondary | ICD-10-CM | POA: Diagnosis present

## 2023-05-12 DIAGNOSIS — D649 Anemia, unspecified: Secondary | ICD-10-CM | POA: Diagnosis not present

## 2023-05-12 DIAGNOSIS — M86172 Other acute osteomyelitis, left ankle and foot: Secondary | ICD-10-CM | POA: Diagnosis not present

## 2023-05-12 DIAGNOSIS — E11621 Type 2 diabetes mellitus with foot ulcer: Secondary | ICD-10-CM | POA: Diagnosis present

## 2023-05-12 DIAGNOSIS — Z79899 Other long term (current) drug therapy: Secondary | ICD-10-CM

## 2023-05-12 DIAGNOSIS — J9811 Atelectasis: Secondary | ICD-10-CM | POA: Diagnosis not present

## 2023-05-12 DIAGNOSIS — N184 Chronic kidney disease, stage 4 (severe): Secondary | ICD-10-CM | POA: Diagnosis present

## 2023-05-12 DIAGNOSIS — X58XXXA Exposure to other specified factors, initial encounter: Secondary | ICD-10-CM | POA: Diagnosis present

## 2023-05-12 DIAGNOSIS — E2740 Unspecified adrenocortical insufficiency: Secondary | ICD-10-CM | POA: Diagnosis present

## 2023-05-12 DIAGNOSIS — B371 Pulmonary candidiasis: Secondary | ICD-10-CM | POA: Diagnosis not present

## 2023-05-12 DIAGNOSIS — Z48813 Encounter for surgical aftercare following surgery on the respiratory system: Secondary | ICD-10-CM | POA: Diagnosis not present

## 2023-05-12 DIAGNOSIS — N189 Chronic kidney disease, unspecified: Secondary | ICD-10-CM | POA: Diagnosis present

## 2023-05-12 DIAGNOSIS — D849 Immunodeficiency, unspecified: Secondary | ICD-10-CM | POA: Diagnosis present

## 2023-05-12 DIAGNOSIS — I13 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, or unspecified chronic kidney disease: Secondary | ICD-10-CM | POA: Diagnosis not present

## 2023-05-12 DIAGNOSIS — Z823 Family history of stroke: Secondary | ICD-10-CM

## 2023-05-12 DIAGNOSIS — J189 Pneumonia, unspecified organism: Secondary | ICD-10-CM | POA: Diagnosis not present

## 2023-05-12 DIAGNOSIS — Z800 Family history of malignant neoplasm of digestive organs: Secondary | ICD-10-CM

## 2023-05-12 DIAGNOSIS — M7989 Other specified soft tissue disorders: Secondary | ICD-10-CM | POA: Diagnosis not present

## 2023-05-12 DIAGNOSIS — L97529 Non-pressure chronic ulcer of other part of left foot with unspecified severity: Secondary | ICD-10-CM | POA: Diagnosis not present

## 2023-05-12 DIAGNOSIS — E43 Unspecified severe protein-calorie malnutrition: Secondary | ICD-10-CM | POA: Diagnosis present

## 2023-05-12 DIAGNOSIS — M869 Osteomyelitis, unspecified: Secondary | ICD-10-CM

## 2023-05-12 DIAGNOSIS — N182 Chronic kidney disease, stage 2 (mild): Secondary | ICD-10-CM

## 2023-05-12 DIAGNOSIS — E1140 Type 2 diabetes mellitus with diabetic neuropathy, unspecified: Secondary | ICD-10-CM | POA: Diagnosis present

## 2023-05-12 DIAGNOSIS — R739 Hyperglycemia, unspecified: Secondary | ICD-10-CM

## 2023-05-12 DIAGNOSIS — L97919 Non-pressure chronic ulcer of unspecified part of right lower leg with unspecified severity: Secondary | ICD-10-CM | POA: Diagnosis present

## 2023-05-12 DIAGNOSIS — Z4682 Encounter for fitting and adjustment of non-vascular catheter: Secondary | ICD-10-CM | POA: Diagnosis not present

## 2023-05-12 DIAGNOSIS — L97519 Non-pressure chronic ulcer of other part of right foot with unspecified severity: Secondary | ICD-10-CM | POA: Diagnosis not present

## 2023-05-12 DIAGNOSIS — Z8711 Personal history of peptic ulcer disease: Secondary | ICD-10-CM

## 2023-05-12 DIAGNOSIS — G9340 Encephalopathy, unspecified: Secondary | ICD-10-CM | POA: Diagnosis not present

## 2023-05-12 DIAGNOSIS — E8809 Other disorders of plasma-protein metabolism, not elsewhere classified: Secondary | ICD-10-CM | POA: Diagnosis present

## 2023-05-12 DIAGNOSIS — Z833 Family history of diabetes mellitus: Secondary | ICD-10-CM

## 2023-05-12 DIAGNOSIS — J440 Chronic obstructive pulmonary disease with acute lower respiratory infection: Secondary | ICD-10-CM | POA: Diagnosis not present

## 2023-05-12 DIAGNOSIS — R918 Other nonspecific abnormal finding of lung field: Secondary | ICD-10-CM | POA: Diagnosis not present

## 2023-05-12 DIAGNOSIS — R300 Dysuria: Secondary | ICD-10-CM | POA: Diagnosis present

## 2023-05-12 DIAGNOSIS — I213 ST elevation (STEMI) myocardial infarction of unspecified site: Secondary | ICD-10-CM

## 2023-05-12 DIAGNOSIS — I252 Old myocardial infarction: Secondary | ICD-10-CM

## 2023-05-12 DIAGNOSIS — Z860102 Personal history of hyperplastic colon polyps: Secondary | ICD-10-CM

## 2023-05-12 DIAGNOSIS — Z951 Presence of aortocoronary bypass graft: Secondary | ICD-10-CM

## 2023-05-12 DIAGNOSIS — Z5181 Encounter for therapeutic drug level monitoring: Secondary | ICD-10-CM

## 2023-05-12 DIAGNOSIS — I5022 Chronic systolic (congestive) heart failure: Secondary | ICD-10-CM | POA: Diagnosis present

## 2023-05-12 DIAGNOSIS — A419 Sepsis, unspecified organism: Principal | ICD-10-CM | POA: Diagnosis present

## 2023-05-12 DIAGNOSIS — E1122 Type 2 diabetes mellitus with diabetic chronic kidney disease: Secondary | ICD-10-CM | POA: Diagnosis not present

## 2023-05-12 DIAGNOSIS — S40022A Contusion of left upper arm, initial encounter: Secondary | ICD-10-CM | POA: Diagnosis present

## 2023-05-12 DIAGNOSIS — U099 Post covid-19 condition, unspecified: Secondary | ICD-10-CM | POA: Diagnosis present

## 2023-05-12 DIAGNOSIS — I4510 Unspecified right bundle-branch block: Secondary | ICD-10-CM | POA: Diagnosis present

## 2023-05-12 DIAGNOSIS — J449 Chronic obstructive pulmonary disease, unspecified: Secondary | ICD-10-CM | POA: Diagnosis present

## 2023-05-12 DIAGNOSIS — J9601 Acute respiratory failure with hypoxia: Secondary | ICD-10-CM | POA: Diagnosis present

## 2023-05-12 DIAGNOSIS — M86171 Other acute osteomyelitis, right ankle and foot: Secondary | ICD-10-CM | POA: Diagnosis not present

## 2023-05-12 DIAGNOSIS — Z7189 Other specified counseling: Secondary | ICD-10-CM | POA: Diagnosis not present

## 2023-05-12 DIAGNOSIS — R1011 Right upper quadrant pain: Secondary | ICD-10-CM | POA: Diagnosis present

## 2023-05-12 DIAGNOSIS — Z8249 Family history of ischemic heart disease and other diseases of the circulatory system: Secondary | ICD-10-CM

## 2023-05-12 DIAGNOSIS — I255 Ischemic cardiomyopathy: Secondary | ICD-10-CM | POA: Diagnosis present

## 2023-05-12 DIAGNOSIS — I6782 Cerebral ischemia: Secondary | ICD-10-CM | POA: Diagnosis not present

## 2023-05-12 DIAGNOSIS — I739 Peripheral vascular disease, unspecified: Secondary | ICD-10-CM | POA: Diagnosis present

## 2023-05-12 DIAGNOSIS — I440 Atrioventricular block, first degree: Secondary | ICD-10-CM | POA: Diagnosis present

## 2023-05-12 LAB — TROPONIN I (HIGH SENSITIVITY)
Troponin I (High Sensitivity): 81 ng/L — ABNORMAL HIGH (ref <18)
Troponin I (High Sensitivity): 39 ng/L — ABNORMAL HIGH (ref <18)

## 2023-05-12 LAB — CBG MONITORING, ED
Glucose-Capillary: 108 mg/dL — ABNORMAL HIGH (ref 70–99)
Glucose-Capillary: 155 mg/dL — ABNORMAL HIGH (ref 70–99)
Glucose-Capillary: 66 mg/dL — ABNORMAL LOW (ref 70–99)
Glucose-Capillary: 141 mg/dL — ABNORMAL HIGH (ref 70–99)
Glucose-Capillary: 169 mg/dL — ABNORMAL HIGH (ref 70–99)

## 2023-05-12 LAB — I-STAT ARTERIAL BLOOD GAS, ED
Acid-Base Excess: 0 mmol/L (ref 0.0–2.0)
Bicarbonate: 24.4 mmol/L (ref 20.0–28.0)
Calcium, Ion: 1.07 mmol/L — ABNORMAL LOW (ref 1.15–1.40)
HCT: 24 % — ABNORMAL LOW (ref 39.0–52.0)
Hemoglobin: 8.2 g/dL — ABNORMAL LOW (ref 13.0–17.0)
O2 Saturation: 96 %
Potassium: 2.7 mmol/L — CL (ref 3.5–5.1)
Sodium: 135 mmol/L (ref 135–145)
TCO2: 26 mmol/L (ref 22–32)
pCO2 arterial: 39.5 mmHg (ref 32–48)
pH, Arterial: 7.398 (ref 7.35–7.45)
pO2, Arterial: 85 mmHg (ref 83–108)

## 2023-05-12 LAB — CBC WITH DIFFERENTIAL/PLATELET
Abs Immature Granulocytes: 0.09 10*3/uL — ABNORMAL HIGH (ref 0.00–0.07)
Basophils Absolute: 0 10*3/uL (ref 0.0–0.1)
Basophils Relative: 0 %
Eosinophils Absolute: 0 10*3/uL (ref 0.0–0.5)
Eosinophils Relative: 0 %
HCT: 25.5 % — ABNORMAL LOW (ref 39.0–52.0)
Hemoglobin: 7.7 g/dL — ABNORMAL LOW (ref 13.0–17.0)
Immature Granulocytes: 1 %
Lymphocytes Relative: 7 %
Lymphs Abs: 0.7 10*3/uL (ref 0.7–4.0)
MCH: 29.6 pg (ref 26.0–34.0)
MCHC: 30.2 g/dL (ref 30.0–36.0)
MCV: 98.1 fL (ref 80.0–100.0)
Monocytes Absolute: 0.5 10*3/uL (ref 0.1–1.0)
Monocytes Relative: 5 %
Neutro Abs: 7.9 10*3/uL — ABNORMAL HIGH (ref 1.7–7.7)
Neutrophils Relative %: 87 %
Platelets: 214 10*3/uL (ref 150–400)
RBC: 2.6 MIL/uL — ABNORMAL LOW (ref 4.22–5.81)
RDW: 20.6 % — ABNORMAL HIGH (ref 11.5–15.5)
WBC: 9.1 10*3/uL (ref 4.0–10.5)
nRBC: 0.3 % — ABNORMAL HIGH (ref 0.0–0.2)

## 2023-05-12 LAB — HEPATIC FUNCTION PANEL
ALT: 13 U/L (ref 0–44)
AST: 32 U/L (ref 15–41)
Albumin: 1.6 g/dL — ABNORMAL LOW (ref 3.5–5.0)
Alkaline Phosphatase: 75 U/L (ref 38–126)
Bilirubin, Direct: 0.2 mg/dL (ref 0.0–0.2)
Indirect Bilirubin: 0.7 mg/dL (ref 0.3–0.9)
Total Bilirubin: 0.9 mg/dL (ref 0.0–1.2)
Total Protein: 4 g/dL — ABNORMAL LOW (ref 6.5–8.1)

## 2023-05-12 LAB — COMPREHENSIVE METABOLIC PANEL WITH GFR
ALT: 13 U/L (ref 0–44)
AST: 24 U/L (ref 15–41)
Albumin: 1.7 g/dL — ABNORMAL LOW (ref 3.5–5.0)
Alkaline Phosphatase: 81 U/L (ref 38–126)
Anion gap: 8 (ref 5–15)
BUN: 8 mg/dL (ref 8–23)
CO2: 30 mmol/L (ref 22–32)
Calcium: 7.2 mg/dL — ABNORMAL LOW (ref 8.9–10.3)
Chloride: 101 mmol/L (ref 98–111)
Creatinine, Ser: 0.9 mg/dL (ref 0.61–1.24)
GFR, Estimated: >60 mL/min (ref >60)
Glucose, Bld: 118 mg/dL — ABNORMAL HIGH (ref 70–99)
Potassium: 2 mmol/L — CL (ref 3.5–5.1)
Sodium: 139 mmol/L (ref 135–145)
Total Bilirubin: 1.1 mg/dL (ref 0.0–1.2)
Total Protein: 4 g/dL — ABNORMAL LOW (ref 6.5–8.1)

## 2023-05-12 LAB — URINALYSIS, W/ REFLEX TO CULTURE (INFECTION SUSPECTED)
Bacteria, UA: NONE SEEN
Bilirubin Urine: NEGATIVE
Glucose, UA: >=500 mg/dL — AB
Hgb urine dipstick: NEGATIVE
Ketones, ur: 5 mg/dL — AB
Leukocytes,Ua: NEGATIVE
Nitrite: NEGATIVE
Protein, ur: NEGATIVE mg/dL
Specific Gravity, Urine: 1.02 (ref 1.005–1.030)
pH: 5 (ref 5.0–8.0)

## 2023-05-12 LAB — BASIC METABOLIC PANEL WITH GFR
Anion gap: 11 (ref 5–15)
BUN: 9 mg/dL (ref 8–23)
CO2: 23 mmol/L (ref 22–32)
Calcium: 7.2 mg/dL — ABNORMAL LOW (ref 8.9–10.3)
Chloride: 102 mmol/L (ref 98–111)
Creatinine, Ser: 1.1 mg/dL (ref 0.61–1.24)
GFR, Estimated: >60 mL/min (ref >60)
Glucose, Bld: 163 mg/dL — ABNORMAL HIGH (ref 70–99)
Potassium: 3.2 mmol/L — ABNORMAL LOW (ref 3.5–5.1)
Sodium: 136 mmol/L (ref 135–145)

## 2023-05-12 LAB — PHOSPHORUS: Phosphorus: 2.2 mg/dL — ABNORMAL LOW (ref 2.5–4.6)

## 2023-05-12 LAB — ETHANOL: Alcohol, Ethyl (B): <15 mg/dL (ref <15)

## 2023-05-12 LAB — RAPID URINE DRUG SCREEN, HOSP PERFORMED
Amphetamines: NOT DETECTED
Barbiturates: NOT DETECTED
Benzodiazepines: NOT DETECTED
Cocaine: NOT DETECTED
Opiates: NOT DETECTED
Tetrahydrocannabinol: NOT DETECTED

## 2023-05-12 LAB — I-STAT CG4 LACTIC ACID, ED
Lactic Acid, Venous: 1.4 mmol/L (ref 0.5–1.9)
Lactic Acid, Venous: 0.6 mmol/L (ref 0.5–1.9)

## 2023-05-12 LAB — PROCALCITONIN: Procalcitonin: <0.1 ng/mL

## 2023-05-12 LAB — LACTATE DEHYDROGENASE: LDH: 267 U/L — ABNORMAL HIGH (ref 98–192)

## 2023-05-12 LAB — POC OCCULT BLOOD, ED: Occult Blood, Feces: NEGATIVE

## 2023-05-12 LAB — EXPECTORATED SPUTUM ASSESSMENT W GRAM STAIN, RFLX TO RESP C

## 2023-05-12 LAB — MRSA NEXT GEN BY PCR, NASAL: MRSA by PCR Next Gen: NOT DETECTED

## 2023-05-12 LAB — AMMONIA: Ammonia: 13 umol/L (ref 9–35)

## 2023-05-12 LAB — GLUCOSE, CAPILLARY
Glucose-Capillary: 163 mg/dL — ABNORMAL HIGH (ref 70–99)
Glucose-Capillary: 222 mg/dL — ABNORMAL HIGH (ref 70–99)
Glucose-Capillary: 227 mg/dL — ABNORMAL HIGH (ref 70–99)
Glucose-Capillary: 255 mg/dL — ABNORMAL HIGH (ref 70–99)

## 2023-05-12 LAB — MAGNESIUM
Magnesium: 0.8 mg/dL — CL (ref 1.7–2.4)
Magnesium: 3.1 mg/dL — ABNORMAL HIGH (ref 1.7–2.4)

## 2023-05-12 LAB — SEDIMENTATION RATE: Sed Rate: 26 mm/h — ABNORMAL HIGH (ref 0–16)

## 2023-05-12 LAB — C-REACTIVE PROTEIN: CRP: 6.5 mg/dL — ABNORMAL HIGH (ref <1.0)

## 2023-05-12 SURGERY — CORONARY/GRAFT ACUTE MI REVASCULARIZATION
Anesthesia: LOCAL

## 2023-05-12 MED ORDER — DEXTROSE 50 % IV SOLN
INTRAVENOUS | Status: AC
  Filled 2023-05-12: qty 50

## 2023-05-12 MED ORDER — MIDODRINE HCL 5 MG PO TABS
15.0000 mg | ORAL_TABLET | Freq: Three times a day (TID) | ORAL | Status: DC
Start: 2023-05-12 — End: 2023-05-12
  Administered 2023-05-12: 15 mg via ORAL
  Filled 2023-05-12: qty 3

## 2023-05-12 MED ORDER — METHYLPREDNISOLONE SODIUM SUCC 40 MG IJ SOLR
40.0000 mg | Freq: Two times a day (BID) | INTRAMUSCULAR | Status: DC
  Administered 2023-05-12 – 2023-05-13 (×2): 40 mg via INTRAVENOUS
  Filled 2023-05-12 (×2): qty 1

## 2023-05-12 MED ORDER — HEPARIN (PORCINE) 25000 UT/250ML-% IV SOLN: 10.0000 [IU]/kg/h | INTRAVENOUS | Status: DC

## 2023-05-12 MED ORDER — SODIUM CHLORIDE 0.9 % IV SOLN: INTRAVENOUS | Status: DC

## 2023-05-12 MED ORDER — HYDROCORTISONE SOD SUC (PF) 100 MG IJ SOLR
100.0000 mg | Freq: Once | INTRAMUSCULAR | Status: AC
  Administered 2023-05-12: 100 mg via INTRAVENOUS
  Filled 2023-05-12: qty 2

## 2023-05-12 MED ORDER — LACTATED RINGERS IV SOLN
INTRAVENOUS | Status: AC
  Administered 2023-05-12: 40 mL/h via INTRAVENOUS

## 2023-05-12 MED ORDER — NOREPINEPHRINE 4 MG/250ML-% IV SOLN
2.0000 ug/min | INTRAVENOUS | Status: DC
  Administered 2023-05-12: 2 ug/min via INTRAVENOUS
  Administered 2023-05-12: 7 ug/min via INTRAVENOUS
  Filled 2023-05-12 (×2): qty 250

## 2023-05-12 MED ORDER — CHLORHEXIDINE GLUCONATE CLOTH 2 % EX PADS
6.0000 | MEDICATED_PAD | Freq: Every day | CUTANEOUS | Status: DC
  Administered 2023-05-12 – 2023-05-16 (×4): 6 via TOPICAL

## 2023-05-12 MED ORDER — DEXTROSE 10 % IV SOLN: INTRAVENOUS | Status: AC

## 2023-05-12 MED ORDER — SENNOSIDES-DOCUSATE SODIUM 8.6-50 MG PO TABS: 1.0000 | ORAL_TABLET | Freq: Every evening | ORAL | Status: DC | PRN

## 2023-05-12 MED ORDER — HEPARIN (PORCINE) 25000 UT/250ML-% IV SOLN
900.0000 [IU]/h | INTRAVENOUS | Status: DC
  Administered 2023-05-12: 900 [IU]/h via INTRAVENOUS
  Filled 2023-05-12: qty 250

## 2023-05-12 MED ORDER — SODIUM CHLORIDE 0.9 % IV SOLN
2.0000 g | Freq: Once | INTRAVENOUS | Status: AC
  Administered 2023-05-12: 2 g via INTRAVENOUS
  Filled 2023-05-12: qty 20

## 2023-05-12 MED ORDER — CALCIUM GLUCONATE-NACL 1-0.675 GM/50ML-% IV SOLN
1.0000 g | Freq: Once | INTRAVENOUS | Status: AC
  Administered 2023-05-12: 1000 mg via INTRAVENOUS
  Filled 2023-05-12: qty 50

## 2023-05-12 MED ORDER — POTASSIUM CHLORIDE 10 MEQ/100ML IV SOLN
10.0000 meq | INTRAVENOUS | Status: AC
  Administered 2023-05-12 (×5): 10 meq via INTRAVENOUS
  Filled 2023-05-12 (×4): qty 100

## 2023-05-12 MED ORDER — MAGNESIUM SULFATE 2 GM/50ML IV SOLN
2.0000 g | Freq: Once | INTRAVENOUS | Status: AC
  Administered 2023-05-12: 2 g via INTRAVENOUS
  Filled 2023-05-12: qty 50

## 2023-05-12 MED ORDER — PANTOPRAZOLE SODIUM 40 MG PO TBEC
40.0000 mg | DELAYED_RELEASE_TABLET | Freq: Every day | ORAL | Status: DC
  Administered 2023-05-12: 40 mg via ORAL
  Filled 2023-05-12: qty 1

## 2023-05-12 MED ORDER — HYDROCORTISONE SOD SUC (PF) 100 MG IJ SOLR: 50.0000 mg | Freq: Four times a day (QID) | INTRAMUSCULAR | Status: DC

## 2023-05-12 MED ORDER — POTASSIUM PHOSPHATES 15 MMOLE/5ML IV SOLN
15.0000 mmol | Freq: Once | INTRAVENOUS | Status: AC
  Administered 2023-05-12: 15 mmol via INTRAVENOUS
  Filled 2023-05-12: qty 5

## 2023-05-12 MED ORDER — NOREPINEPHRINE 4 MG/250ML-% IV SOLN
0.0000 ug/min | INTRAVENOUS | Status: DC
  Administered 2023-05-12: 13 ug/min via INTRAVENOUS
  Administered 2023-05-12: 16 ug/min via INTRAVENOUS
  Administered 2023-05-13: 10 ug/min via INTRAVENOUS
  Administered 2023-05-13: 15 ug/min via INTRAVENOUS
  Administered 2023-05-13: 13 ug/min via INTRAVENOUS
  Administered 2023-05-13: 9 ug/min via INTRAVENOUS
  Filled 2023-05-12 (×6): qty 250

## 2023-05-12 MED ORDER — SODIUM CHLORIDE 0.9 % IV SOLN
250.0000 mL | INTRAVENOUS | Status: AC
  Administered 2023-05-12: 250 mL via INTRAVENOUS

## 2023-05-12 MED ORDER — POTASSIUM CHLORIDE CRYS ER 20 MEQ PO TBCR
20.0000 meq | EXTENDED_RELEASE_TABLET | Freq: Three times a day (TID) | ORAL | Status: DC
  Administered 2023-05-12: 20 meq via ORAL
  Filled 2023-05-12 (×2): qty 1

## 2023-05-12 MED ORDER — POTASSIUM CHLORIDE 20 MEQ PO PACK
40.0000 meq | PACK | Freq: Once | ORAL | Status: AC
  Administered 2023-05-12: 40 meq via ORAL
  Filled 2023-05-12: qty 2

## 2023-05-12 MED ORDER — DEXTROSE 10 % IV SOLN: INTRAVENOUS | Status: DC

## 2023-05-12 MED ORDER — MIDODRINE HCL 5 MG PO TABS
15.0000 mg | ORAL_TABLET | Freq: Three times a day (TID) | ORAL | Status: DC
  Administered 2023-05-12 – 2023-05-16 (×7): 15 mg via ORAL
  Filled 2023-05-12 (×8): qty 3

## 2023-05-12 MED ORDER — DEXTROSE 50 % IV SOLN
50.0000 mL | Freq: Once | INTRAVENOUS | Status: AC
  Administered 2023-05-12: 50 mL via INTRAVENOUS

## 2023-05-12 MED ORDER — MAGNESIUM SULFATE 2 GM/50ML IV SOLN
2.0000 g | Freq: Once | INTRAVENOUS | Status: AC
Start: 2023-05-12 — End: 2023-05-12
  Administered 2023-05-12: 2 g via INTRAVENOUS
  Filled 2023-05-12: qty 50

## 2023-05-12 MED ORDER — ORAL CARE MOUTH RINSE: 15.0000 mL | OROMUCOSAL | Status: DC | PRN

## 2023-05-12 MED ORDER — VANCOMYCIN HCL 1.25 G IV SOLR
1250.0000 mg | INTRAVENOUS | Status: DC
  Administered 2023-05-13: 1250 mg via INTRAVENOUS
  Filled 2023-05-12: qty 25

## 2023-05-12 MED ORDER — INSULIN ASPART 100 UNIT/ML IJ SOLN
0.0000 [IU] | INTRAMUSCULAR | Status: DC
  Administered 2023-05-12: 2 [IU] via SUBCUTANEOUS
  Administered 2023-05-12: 1 [IU] via SUBCUTANEOUS
  Administered 2023-05-12: 2 [IU] via SUBCUTANEOUS
  Administered 2023-05-13: 3 [IU] via SUBCUTANEOUS
  Administered 2023-05-13: 1 [IU] via SUBCUTANEOUS
  Administered 2023-05-13 (×2): 2 [IU] via SUBCUTANEOUS
  Administered 2023-05-13: 3 [IU] via SUBCUTANEOUS
  Administered 2023-05-13 – 2023-05-14 (×2): 2 [IU] via SUBCUTANEOUS
  Administered 2023-05-14: 1 [IU] via SUBCUTANEOUS
  Administered 2023-05-14: 2 [IU] via SUBCUTANEOUS

## 2023-05-12 MED ORDER — SODIUM CHLORIDE 0.9 % IV SOLN
2.0000 g | Freq: Two times a day (BID) | INTRAVENOUS | Status: DC
  Administered 2023-05-12 – 2023-05-16 (×10): 2 g via INTRAVENOUS
  Filled 2023-05-12 (×10): qty 12.5

## 2023-05-12 MED ORDER — ATORVASTATIN CALCIUM 40 MG PO TABS
40.0000 mg | ORAL_TABLET | Freq: Every day | ORAL | Status: DC
  Administered 2023-05-12 – 2023-05-16 (×4): 40 mg via ORAL
  Filled 2023-05-12 (×4): qty 1

## 2023-05-12 MED ORDER — MAGNESIUM SULFATE 2 GM/50ML IV SOLN
2.0000 g | Freq: Once | INTRAVENOUS | Status: AC
  Administered 2023-05-12: 2 g via INTRAVENOUS

## 2023-05-12 MED ORDER — IPRATROPIUM-ALBUTEROL 0.5-2.5 (3) MG/3ML IN SOLN
3.0000 mL | Freq: Four times a day (QID) | RESPIRATORY_TRACT | Status: DC
  Administered 2023-05-12 – 2023-05-16 (×16): 3 mL via RESPIRATORY_TRACT
  Filled 2023-05-12 (×12): qty 3
  Filled 2023-05-12: qty 6
  Filled 2023-05-12 (×4): qty 3

## 2023-05-12 MED ORDER — SODIUM CHLORIDE 0.9% FLUSH
3.0000 mL | Freq: Two times a day (BID) | INTRAVENOUS | Status: DC
  Administered 2023-05-12 – 2023-05-16 (×5): 3 mL via INTRAVENOUS

## 2023-05-12 MED ORDER — SODIUM CHLORIDE 0.9% FLUSH
10.0000 mL | Freq: Two times a day (BID) | INTRAVENOUS | Status: DC
  Administered 2023-05-12 – 2023-05-15 (×6): 10 mL
  Administered 2023-05-15: 40 mL
  Administered 2023-05-16: 20 mL

## 2023-05-12 MED ORDER — MAGNESIUM SULFATE 2 GM/50ML IV SOLN: 2.0000 g | Freq: Once | INTRAVENOUS | Status: DC

## 2023-05-12 MED ORDER — AMIODARONE HCL 200 MG PO TABS
200.0000 mg | ORAL_TABLET | Freq: Every morning | ORAL | Status: DC
  Administered 2023-05-12 – 2023-05-16 (×4): 200 mg via ORAL
  Filled 2023-05-12 (×4): qty 1

## 2023-05-12 MED ORDER — HYDROCORTISONE SOD SUC (PF) 100 MG IJ SOLR: 100.0000 mg | Freq: Two times a day (BID) | INTRAMUSCULAR | Status: DC

## 2023-05-12 MED ORDER — ORAL CARE MOUTH RINSE
15.0000 mL | OROMUCOSAL | Status: DC
  Administered 2023-05-12 – 2023-05-16 (×13): 15 mL via OROMUCOSAL

## 2023-05-12 MED ORDER — SODIUM CHLORIDE 0.9% FLUSH: 10.0000 mL | INTRAVENOUS | Status: DC | PRN

## 2023-05-12 MED ORDER — TRIMETHOBENZAMIDE HCL 100 MG/ML IM SOLN: 200.0000 mg | Freq: Four times a day (QID) | INTRAMUSCULAR | Status: DC | PRN

## 2023-05-12 MED ORDER — MEDIHONEY WOUND/BURN DRESSING EX PSTE
1.0000 | PASTE | Freq: Every day | CUTANEOUS | Status: DC
  Administered 2023-05-12 – 2023-05-14 (×3): 1 via TOPICAL
  Filled 2023-05-12: qty 44

## 2023-05-12 MED ORDER — DIGOXIN 125 MCG PO TABS
0.1250 mg | ORAL_TABLET | Freq: Every day | ORAL | Status: DC
  Administered 2023-05-12: 0.125 mg via ORAL
  Filled 2023-05-12: qty 1

## 2023-05-12 MED ORDER — ACETAMINOPHEN 650 MG RE SUPP: 650.0000 mg | Freq: Four times a day (QID) | RECTAL | Status: DC | PRN

## 2023-05-12 MED ORDER — ALLOPURINOL 300 MG PO TABS
300.0000 mg | ORAL_TABLET | Freq: Every day | ORAL | Status: DC
  Administered 2023-05-12: 300 mg via ORAL
  Filled 2023-05-12: qty 3

## 2023-05-12 MED ORDER — VANCOMYCIN HCL IN DEXTROSE 1-5 GM/200ML-% IV SOLN
1000.0000 mg | Freq: Once | INTRAVENOUS | Status: AC
  Administered 2023-05-12: 1000 mg via INTRAVENOUS
  Filled 2023-05-12: qty 200

## 2023-05-12 MED ORDER — ACETAMINOPHEN 325 MG PO TABS
650.0000 mg | ORAL_TABLET | Freq: Four times a day (QID) | ORAL | Status: DC | PRN
  Administered 2023-05-14 (×2): 650 mg via ORAL
  Filled 2023-05-12 (×2): qty 2

## 2023-05-12 MED ORDER — SODIUM CHLORIDE 0.9 % IV BOLUS
1000.0000 mL | Freq: Once | INTRAVENOUS | Status: AC
  Administered 2023-05-12: 1000 mL via INTRAVENOUS

## 2023-05-12 NOTE — Progress Notes (Signed)
 Peripherally Inserted Central Catheter Placement  The IV Nurse has discussed with the patient and/or persons authorized to consent for the patient, the purpose of this procedure and the potential benefits and risks involved with this procedure.  The benefits include less needle sticks, lab draws from the catheter, and the patient may be discharged home with the catheter. Risks include, but not limited to, infection, bleeding, blood clot (thrombus formation), and puncture of an artery; nerve damage and irregular heartbeat and possibility to perform a PICC exchange if needed/ordered by physician.  Alternatives to this procedure were also discussed.  Bard Power PICC patient education guide, fact sheet on infection prevention and patient information card has been provided to patient /or left at bedside.   Consent obtained with wife at bedside  PICC Placement Documentation  PICC Triple Lumen 05/12/23 Right Brachial 38 cm 0 cm (Active)  Indication for Insertion or Continuance of Line Vasoactive infusions 05/12/23 1300  Exposed Catheter (cm) 0 cm 05/12/23 1300  Site Assessment Clean, Dry, Intact 05/12/23 1300  Lumen #1 Status Flushed;Saline locked;Blood return noted 05/12/23 1300  Lumen #2 Status Flushed;Saline locked;Blood return noted 05/12/23 1300  Lumen #3 Status Flushed;Saline locked;Blood return noted 05/12/23 1300  Dressing Type Transparent;Securing device 05/12/23 1300  Dressing Status Antimicrobial disc/dressing in place;Clean, Dry, Intact 05/12/23 1300  Line Care Connections checked and tightened 05/12/23 1300  Line Adjustment (NICU/IV Team Only) No 05/12/23 1300  Dressing Intervention New dressing;Adhesive placed at insertion site (IV team only) 05/12/23 1300  Dressing Change Due 05/19/23 05/12/23 1300       Roseanne Cones Renee 05/12/2023, 1:28 PM

## 2023-05-12 NOTE — Consult Note (Addendum)
 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 male with hx of angiogram with left SFA stenting on 02/14/2023 by Dr. Susi Eric. 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 Susi Eric.  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 Susi Eric.   He was admitted to the hospital ER earlier today with lethragy.  He did have a CXR that revealed near complete opacification of the left thorax that favored a pleural effusion and atelectasis.  His head CT was negative.  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  Blood cx have been obtained and he was started on IV abx.  Vascular surgery is consulted for further evaluation.   He states he was at the nursing home for about 20 days and then went home for a day and now he is back in the ER with trouble breathing.  He states he has a new sore on his right heel.  He states that he has pain in the left foot as well.  He also has a sore on the right 2nd toe that he says a shoe rubbed the wrong way and has been there 5-6 weeks.  No better and no worse.  He gets around in a wheelchair.    Pt is on eliquis  for afib.  He also has hx of COPD, DM, GERD, CAD with hx of CABG; hx of gastric ulcer, neuropathy.   The pt is on a statin for cholesterol management.  The pt is not on a daily aspirin .   Other AC:  Eliquis  The pt is on BB, diuretic for hypertension.   The pt is  on medication for diabetes PTA. Tobacco hx:  former  Past Medical History:  Diagnosis Date   Acute gastric ulcer with hemorrhage 05/06/2013   Arthritis    Asthma    Atrial enlargement, left    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    Cataract    COPD (chronic obstructive pulmonary disease) (HCC)     Diabetes mellitus    Diabetic nephropathy (HCC)    Diverticulosis    ED (erectile dysfunction)    GERD (gastroesophageal reflux disease)    Gout    Hearing loss    Hiatal hernia 05/2008   EGD with HH and reflux esophagitis.    History of MI (myocardial infarction)    History of nuclear stress test 08/07/2010   dipyridamole; EKG negative for ischemia, low risk scan    Hyperlipidemia    Hyperplastic colon polyp 05/2008   Hypertension    Ischemic cardiomyopathy    EF 45%, with inferior wall motion abnormality    Myocardial infarction (HCC)    Valvular regurgitation    mitral and tricuspid (mild)    Past Surgical History:  Procedure Laterality Date   ABDOMINAL AORTOGRAM W/LOWER EXTREMITY N/A 02/14/2023   Procedure: ABDOMINAL AORTOGRAM W/LOWER EXTREMITY;  Surgeon: Philipp Brawn, MD;  Location: [facility] INVASIVE CV LAB;  Service: Cardiovascular;  Laterality: N/A;   AMPUTATION Left 03/22/2023   Procedure: FIFTH TOE AMPUTATION LEFT FOOT;  Surgeon: Philipp Brawn, MD;  [facility];  Service: Vascular;  Laterality: Left;   CARDIOVERSION N/A 08/16/2022   Procedure: CARDIOVERSION;  Surgeon: Dorothye Gathers  C, MD;  Location: [facility] INVASIVE CV LAB;  Service: Cardiovascular;  Laterality: N/A;   CORONARY ARTERY BYPASS GRAFT  2000   LIMA to LAD, free IMA to OM2, sequential graft to PLA & PLD   ESOPHAGOGASTRODUODENOSCOPY N/A 05/07/2013   Procedure: ESOPHAGOGASTRODUODENOSCOPY (EGD);  Surgeon: Kenney Peacemaker, MD;  Location: [facility] ENDOSCOPY;  Service: Endoscopy;  Laterality: N/A;   PERIPHERAL VASCULAR INTERVENTION Left 02/14/2023   Procedure: PERIPHERAL VASCULAR INTERVENTION;  Surgeon: Philipp Brawn, MD;  Location: [facility] INVASIVE CV LAB;  Service: Cardiovascular;  Laterality: Left;  SFA   TEE WITHOUT CARDIOVERSION N/A 08/16/2022   Procedure: TRANSESOPHAGEAL ECHOCARDIOGRAM;  Surgeon: Hugh Madura, MD;  Location: [facility] INVASIVE CV LAB;  Service: Cardiovascular;  Laterality: N/A;   TRANSTHORACIC ECHOCARDIOGRAM  09/07/2010    EF 45-50%; LV systolic function mildly reduced; LA mildly dilated; mild-mod MR & mild-mod TR; aortic root sclerosis/calcification;     No Known Allergies  Prior to Admission medications   Medication Sig Start Date End Date Taking? Authorizing Provider  acetaminophen  (TYLENOL ) 325 MG tablet Take 2 tablets (650 mg total) by mouth every 6 (six) hours as needed for mild pain (pain score 1-3) (or Fever >/= 101). 04/19/23  Yes Hongalgi, Thomasene Flemings, MD  allopurinol  (ZYLOPRIM ) 300 MG tablet TAKE 1 TABLET BY MOUTH DAILY 04/26/23  Yes Austine Lefort, MD  amiodarone  (PACERONE ) 200 MG tablet Take 1 tablet (200 mg total) by mouth every morning. 04/19/23  Yes Hongalgi, Anand D, MD  doxazosin  (CARDURA ) 2 MG tablet Take 2 mg by mouth daily. 04/29/23  Yes [provider]  ELIQUIS  5 MG TABS tablet TAKE 1 TABLET BY MOUTH 2 TIMES A DAY 03/23/23  Yes Austine Lefort, MD  glipiZIDE  (GLUCOTROL  XL) 5 MG 24 hr tablet Take 5 mg by mouth daily. 04/28/23  Yes [provider]  metoprolol  tartrate (LOPRESSOR ) 50 MG tablet Take 1.5 tablets (75 mg total) by mouth 2 (two) times daily. 04/19/23  Yes Hongalgi, Anand D, MD  mirtazapine  (REMERON ) 30 MG tablet TAKE 1 TABLET BY MOUTH EVERY NIGHT AT BEDTIME FOR APPETITE Patient taking differently: Take 30 mg by mouth at bedtime. TAKE 1 TABLET BY MOUTH EVERY NIGHT AT BEDTIME FOR APPETITE 03/07/23  Yes Austine Lefort, MD  NOVOLOG  FLEXPEN 100 UNIT/ML FlexPen Inject 0-9 Units into the skin 3 (three) times daily with meals as needed for high blood sugar.  Inject 0-9 Units into the skin 3 (three) times daily with meals. 0-9 Units, Subcutaneous, 3 times daily with meals, First dose on Wed 04/13/23 at 0800 Correction coverage: Sensitive (thin, NPO, renal) CBG < 70: Implement Hypoglycemia protocol, CBG 70 - 120: 0 units CBG 121 - 150: 1 unit CBG 151 - 200: 2 units CBG 201 - 250: 3 units CBG 251 - 300: 5 units CBG 301 - 350: 7 units CBG 351 - 400: 9 units CBG > 400: call MD. 04/  05/09/23  Yes [provider]  simvastatin  (ZOCOR ) 40 MG tablet Take 40 mg by mouth daily.   Yes [provider]  spironolactone  (ALDACTONE ) 25 MG tablet Take 25 mg by mouth 2 (two) times daily.   Yes [provider]  Tocilizumab  (ACTEMRA  IV) Inject 6 mg/kg into the vein every 28 (twenty-eight) days.   Yes [provider]    Social History   Socioeconomic History   Marital status: Married    Spouse name: Not on file   Number of children: 1   Years of education: Not on  file   Highest education level: Not on file  Occupational History   Not on file  Tobacco Use   Smoking status: Former    Current packs/day: 0.00    Types: Cigarettes    Quit date: 11/04/1985    Years since quitting: 37.5   Smokeless tobacco: Never  Vaping Use   Vaping status: Never Used  Substance and Sexual Activity   Alcohol use: Not Currently   Drug use: No   Sexual activity: Not on file  Other Topics Concern   Not on file  Social History Narrative   Not on file   Social Drivers of Health   Financial Resource Strain: Low Risk  (08/24/2022)   Overall Financial Resource Strain (CARDIA)    Difficulty of Paying Living Expenses: Not hard at all  Food Insecurity: No Food Insecurity (04/13/2023)   Hunger Vital Sign    Worried About Running Out of Food in the Last Year: Never true    Ran Out of Food in the Last Year: Never true  Transportation Needs: No Transportation Needs (04/13/2023)   PRAPARE - Administrator, Civil Service (Medical): No    Lack of Transportation (Non-Medical): No  Physical Activity: Sufficiently Active (07/10/2021)   Exercise Vital Sign    Days of Exercise per Week: 5 days    Minutes of Exercise per Session: 30 min  Stress: No Stress Concern Present (08/24/2022)   [employer] of Occupational Health - Occupational Stress Questionnaire    Feeling of Stress : Only a little  Social Connections: Moderately Integrated (04/12/2023)   Social  Connection and Isolation Panel [NHANES]    Frequency of Communication with Friends and Family: Twice a week    Frequency of Social Gatherings with Friends and Family: Twice a week    Attends Religious Services: More than 4 times per year    Active Member of [employer] or Organizations: No    Attends Banker Meetings: Never    Marital Status: Married  Catering manager Violence: Not At Risk (04/13/2023)   Humiliation, Afraid, Rape, and Kick questionnaire    Fear of Current or Ex-Partner: No    Emotionally Abused: No    Physically Abused: No    Sexually Abused: No    Family History  Problem Relation Age of Onset   Stroke Father    Hypertension Father    Diabetes Brother    Esophageal cancer Brother    Stroke Maternal Grandmother    Stroke Maternal Grandfather    Stomach cancer Maternal Aunt    Colon cancer Neg Hx    Rectal cancer Neg Hx      Physical Examination  Vitals:   05/12/23 0800 05/12/23 0830  BP: (!) 103/39 (!) 87/57  Pulse: 65 (!) 59  Resp: (!) 21 (!) 23  Temp:    SpO2: 100% 100%   Body mass index is 21.45 kg/m.  General:  WDWN in NAD Gait: Not observed HENT: WNL, normocephalic Pulmonary: normal non-labored breathing Skin: with frail skin and bruising of the arms.  Vascular Exam/Pulses: Brisk right AT doppler flow Brisk left AT and PT doppler flow Extremities: ulcerations as pictured.  He has pitting edema as well.  Right foot:   Left foot:               CBC    Component Value Date/Time   WBC 9.1 05/12/2023 0327   RBC 2.60 (L) 05/12/2023 0327   HGB 8.2 (L) 05/12/2023 0756  HGB 12.7 (L) 09/24/2022 1507   HCT 24.0 (L) 05/12/2023 0756   HCT 39.9 09/24/2022 1507   PLT 214 05/12/2023 0327   PLT 128 (L) 09/24/2022 1507   MCV 98.1 05/12/2023 0327   MCV 103 (H) 09/24/2022 1507   MCH 29.6 05/12/2023 0327   MCHC 30.2 05/12/2023 0327   RDW 20.6 (H) 05/12/2023 0327   RDW 16.1 (H) 09/24/2022 1507   LYMPHSABS 0.7 05/12/2023 0327    MONOABS 0.5 05/12/2023 0327   EOSABS 0.0 05/12/2023 0327   BASOSABS 0.0 05/12/2023 0327    BMET    Component Value Date/Time   NA 135 05/12/2023 0756   NA 142 08/30/2022 1436   K 2.7 (LL) 05/12/2023 0756   CL 101 05/12/2023 0327   CO2 30 05/12/2023 0327   GLUCOSE 118 (H) 05/12/2023 0327   BUN 8 05/12/2023 0327   BUN 22 08/30/2022 1436   CREATININE 0.90 05/12/2023 0327   CREATININE 1.24 (H) 03/07/2023 1111   CALCIUM  7.2 (L) 05/12/2023 0327   GFRNONAA >60 05/12/2023 0327   GFRNONAA 62 09/13/2019 0907   GFRAA 72 09/13/2019 0907    COAGS: Lab Results  Component Value Date   INR 1.6 (H) 04/13/2023   INR 1.7 (H) 04/12/2023   INR 1.3 (H) 03/22/2023     Non-Invasive Vascular Imaging:   Left foot xray 05/12/2023: IMPRESSION: 1. Destructive changes involving the left fifth metatarsal head, suggesting acute osteomyelitis, with adjacent soft tissue swelling/ulcer. 2. 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  PCXR 05/12/2023: IMPRESSION: 1. Near complete opacification of the left hemithorax, favoring a moderate to large left pleural effusion with underlying atelectasis, although obscured  Chest CT 05/12/2023: IMPRESSION: 1. Dense consolidative airspace disease in the left upper lobe with left lower lobe collapse/consolidation. Patchy nodular airspace opacity in the posterior right upper lobe and scattered through the right middle lobe. Dense consolidative airspace disease right lower lobe. Imaging features compatible with multifocal pneumonia. 2. Moderate left pleural effusion. 3. Cholelithiasis. 4. Scattered small low-density liver lesions cannot be definitively characterized on this noncontrast study. While likely benign, follow-up CT or ultrasound in 3 months recommended to ensure stability. Given the patient's extensive lung disease, immediate follow-up MRI of the abdomen is not recommended due to the potential for substantial imitation by  motion artifact. 5. T12 compression fracture is age indeterminate but new since abdomen/pelvis CT of 08/19/2022   ASSESSMENT/PLAN: This is a 83 y.o. male with hx of angiogram with left SFA stenting on 02/14/2023 by Dr. Susi Eric. 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 Susi Eric.  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 Susi Eric.    -pt with new ulcerations on his feet since I saw him in the office in March.  He has brisk doppler flow left AT and PT.  Xray shows acute osteomyelitis.  He likely would benefit from angiogram however, currently he most likely would not be able to lay flat and requiring mild pressor support currently.  He is at high risk for more proximal amputation.  Continue to float heels off bed. -Dr. [name] to evaluate pt and determine further plan -pt on Eliquis  for afib   Maryanna Smart, PA-C [facility] [phone]  VASCULAR STAFF ADDENDUM: I have independently interviewed and examined the patient. I agree with the above.  Status post L SFA stenting 02/14/23 for CLTI. Has developed  new ischemic ulceration about bilateral feet. He has been admitted to ICU for respiratory failure and shock. He is on 15L/m [place] and norepinephrine . Doppler flow in bilateral feet noted. See above for photos of new ischemic ulcers. Not a candidate for intervention at the moment. Will check ABI and BLE arterial duplex.  Heber Little. [name], MD [facility]  [facility] Phone Number: [phone] 05/12/2023 3:17 PM

## 2023-05-12 NOTE — ED Notes (Signed)
 Dr. Brice Campi made aware of pts O2 status.

## 2023-05-12 NOTE — Progress Notes (Signed)
 Pharmacy Electrolyte Replacement  Recent Labs:  Recent Labs    05/12/23 1355  K 3.2*  MG 3.1*  PHOS 2.2*  CREATININE 1.10    Low Critical Values (K </= 2.5, Phos </= 1, Mg </= 1) Present: None  MD Contacted: N/A  Plan: Give potassium phosphate 15 mmol IV x 1 dose and complete ordered potassium 20 mEq for the remaining two doses.   Juleen Oakland, PharmD PGY1 Pharmacy Resident 05/12/2023 2:59 PM

## 2023-05-12 NOTE — TOC Initial Note (Signed)
 Transition of Care [facility]) - Initial/Assessment Note    Patient Details  Name: Donald Rios MRN: [id number] Date of Birth: 1940/06/23  Transition [facility] CM/SW Contact:    Juliane Och, LCSW Phone Number: 05/12/2023, 12:28 PM  Clinical Narrative:                  12:28 PM CSW introduced self and role to patient's spouse, Devra Fontana. Devra Fontana confirmed that patient recently discharged from [facility] (05/09/2023) and returned home with her prior to current hospitalization. Devra Fontana confirmed her and spouse reside together. Devra Fontana stated that patient's brothers could provide transportation and home support upon discharge but that the live approximately 45 minutes away from patient's residence with spouse. Devra Fontana denied SNF history prior to patient's admissions to [facility]. Devra Fontana confirmed patient has HH/DME History. Devra Fontana stated patient was active with [facility] in the past. Devra Fontana stated that they ordered a hospital bed for patient that has yet to be deliver to their home, but that patient has other DME (walker, wheelchair, cane).  Expected Discharge Plan: Home/Self Care Barriers to Discharge: Continued Medical Work up   Patient Goals and CMS Choice            Expected Discharge Plan and Services       Living arrangements for the past 2 months: Single Family Home                                      Prior Living Arrangements/Services Living arrangements for the past 2 months: Single Family Home Lives with:: Spouse Patient language and need for interpreter reviewed:: Yes        Need for Family Participation in Patient Care: Yes (Comment) Care giver support system in place?: Yes (comment)   Criminal Activity/Legal Involvement Pertinent to Current Situation/Hospitalization: No - Comment as needed  Activities of Daily Living      Permission Sought/Granted Permission sought to share information with : Family Supports Permission  granted to share information with : No (Contact information on chart)  Share Information with NAME: Kyri Carpio     Permission granted to share info w Relationship: Spouse  Permission granted to share info w Contact Information: [phone]  Emotional Assessment Appearance:: Appears stated age Attitude/Demeanor/Rapport: Unable to Assess Affect (typically observed): Unable to Assess Orientation: : Oriented to Self, Oriented to Place Alcohol / Substance Use: Not Applicable Psych Involvement: No (comment)  Admission diagnosis:  Hypokalemia [E87.6] Hypoglycemia [E16.2] STEMI (ST elevation myocardial infarction) (HCC) [I21.3] Acute respiratory failure with hypoxia (HCC) [J96.01] Normochromic normocytic anemia [D64.9] Elevated random blood glucose level [R73.9] Severe anemia [D64.9] Other acute osteomyelitis of left foot (HCC) [W29.562] Patient Active Problem List   Diagnosis Date Noted   Acute respiratory failure with hypoxia (HCC) 05/12/2023   Pleural effusion on left 05/12/2023   Acute osteomyelitis of left foot (HCC) 05/12/2023   Hypoglycemia 05/12/2023   Acute encephalopathy 05/12/2023   Elevated troponin 05/12/2023   Hypokalemia 05/12/2023   Pyogenic inflammation of bone (HCC) 05/12/2023   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    Protein-calorie malnutrition, severe 04/15/2023   Severe sepsis (HCC) 04/14/2023   Sepsis (HCC) 04/12/2023   Pseudoaneurysm of right femoral artery (HCC) 03/22/2023   PAD (peripheral artery disease) (HCC) 03/22/2023   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) 02/24/2023   Encntr for surgical aftcr following surgery  on the circ sys 02/24/2023   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) 02/24/2023   Cutaneous abscess of left foot 02/24/2023   Gangrene of toe of left foot (HCC) 02/10/2023   Cellulitis of left lower limb 02/09/2023   Sepsis due to cellulitis (HCC) 02/08/2023    Athscl heart disease of native coronary artery w/o ang pctrs 01/12/2023   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 01/12/2023   Unspecified hearing loss, unspecified ear 01/12/2023   Hyperlipidemia, unspecified 01/12/2023   Personal history of nicotine dependence 01/12/2023   Presence of aortocoronary bypass graft 01/12/2023   COPD (chronic obstructive pulmonary disease) (HCC) 01/12/2023   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/12/2023   Unspecified osteoarthritis, unspecified site 01/12/2023   Unqualified visual loss, left eye, normal vision right eye 01/12/2023   Gastro-esophageal reflux disease with esophagitis, without bleeding 01/12/2023   Long term (current) use of oral hypoglycemic drugs 01/12/2023   Long term (current) use of anticoagulants 01/12/2023   Hypotension, unspecified 01/12/2023   Type 2 diabetes mellitus with diabetic chronic kidney disease (HCC) 01/12/2023   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, or unspecified chronic kidney disease (HCC) 01/12/2023   History of falling 01/12/2023   Gout, unspecified 01/12/2023   Dvrtclos of intest, part unsp, 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 01/12/2023   Incisional hernia, without obstruction or gangrene 01/12/2023   High risk medication use 10/25/2022   Screening for tuberculosis 10/25/2022   Chronic atrial fibrillation (HCC) 08/15/2022   Cardiomyopathy (HCC) 08/15/2022   Temporal arteritis (HCC) 08/13/2022   Anemia in chronic kidney disease 08/04/2022   Protein-calorie malnutrition (HCC) 11/21/2018   Hx of CABG 08/30/2017   Ischemic cardiomyopathy 12/23/2015   Chronic systolic (congestive) heart failure (HCC) 12/23/2015   Acute gastric ulcer with hemorrhage 05/06/2013   Mild tricuspid regurgitation    Atrial enlargement, left    Diverticulosis    Hiatal hernia    Gout    DM2 (diabetes mellitus, type 2) (HCC)    GERD (gastroesophageal reflux  disease)    Essential hypertension    PCP:  Austine Lefort, MD Pharmacy:   [facility] [id number] - [address] [place] - [address] [place] [address] Phone: [phone] Fax: [phone]  [place] [facility] [address] [place] [address] Phone: [phone] Fax: [phone]     Social Drivers of Health (SDOH) Social History: SDOH Screenings   Food Insecurity: No Food Insecurity (04/13/2023)  Housing: Low Risk  (04/13/2023)  Transportation Needs: No Transportation Needs (04/13/2023)  Utilities: Not At Risk (04/12/2023)  Alcohol Screen: Low Risk  (07/22/2022)  Depression (PHQ2-9): Low Risk  (01/07/2023)  Financial Resource Strain: Low Risk  (08/24/2022)  Physical Activity: Sufficiently Active (07/10/2021)  Social Connections: Moderately Integrated (04/12/2023)  Stress: No Stress Concern Present (08/24/2022)  Tobacco Use: Medium Risk (05/12/2023)  Health Literacy: Adequate Health Literacy (08/24/2022)   SDOH Interventions:     Readmission Risk Interventions     No data to display

## 2023-05-12 NOTE — Progress Notes (Signed)
   05/12/23 0741  BiPAP/CPAP/SIPAP  BiPAP/CPAP/SIPAP Pt Type Adult  BiPAP/CPAP/SIPAP SERVO  Mask Type Full face mask  Mask Size Medium  Set Rate (S)  18 breaths/min  Respiratory Rate 25 breaths/min  IPAP 10 cmH20  EPAP 6 cmH2O  Pressure Support 16 cmH20  PEEP 6 cmH20  FiO2 (%) 100 %  Minute Ventilation 13.2  Leak 39  Peak Inspiratory Pressure (PIP) 18  Tidal Volume (Vt) 533  Patient Home Machine No  Patient Home Mask No  Patient Home Tubing No  Auto Titrate No  Press High Alarm 25 cmH2O  Press Low Alarm 5 cmH2O  BiPAP/CPAP /SiPAP Vitals  Pulse Rate (!) 54  Resp (!) 24  SpO2 100 %  MEWS Score/Color  MEWS Score 2  MEWS Score Color Yellow    RT came to bedside and changed BIPAP to NIV/PRESSURE control with a rate.

## 2023-05-12 NOTE — Evaluation (Signed)
 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    History of MI (myocardial infarction)    History of nuclear stress test 08/07/2010   dipyridamole; EKG negative for ischemia, low risk scan    Hyperlipidemia    Hyperplastic colon polyp 05/2008   Hypertension    Ischemic cardiomyopathy    EF 45%, with inferior wall motion abnormality    Myocardial infarction (HCC)    Valvular regurgitation    mitral and tricuspid (mild)   Past Surgical History:  Past Surgical History:  Procedure Laterality Date   ABDOMINAL AORTOGRAM W/LOWER EXTREMITY N/A 02/14/2023   Procedure: ABDOMINAL AORTOGRAM W/LOWER EXTREMITY;  Surgeon: Philipp Brawn, MD;  Location: [facility] INVASIVE CV LAB;  Service: Cardiovascular;  Laterality: N/A;   AMPUTATION Left 03/22/2023   Procedure: FIFTH TOE AMPUTATION LEFT FOOT;  Surgeon: Philipp Brawn, MD;  Location: [facility] OR;  Service: Vascular;  Laterality: Left;   CARDIOVERSION N/A 08/16/2022   Procedure: CARDIOVERSION;  Surgeon: Hugh Madura, MD;  Location: [facility] INVASIVE CV LAB;  Service: Cardiovascular;  Laterality: N/A;   CORONARY ARTERY BYPASS GRAFT  2000   LIMA to LAD, free IMA to OM2, sequential graft to PLA & PLD   ESOPHAGOGASTRODUODENOSCOPY N/A 05/07/2013    Procedure: ESOPHAGOGASTRODUODENOSCOPY (EGD);  Surgeon: Kenney Peacemaker, MD;  Location: [facility] ENDOSCOPY;  Service: Endoscopy;  Laterality: N/A;   PERIPHERAL VASCULAR INTERVENTION Left 02/14/2023   Procedure: PERIPHERAL VASCULAR INTERVENTION;  Surgeon: Philipp Brawn, MD;  Location: [facility] INVASIVE CV LAB;  Service: Cardiovascular;  Laterality: Left;  SFA   TEE WITHOUT CARDIOVERSION N/A 08/16/2022   Procedure: TRANSESOPHAGEAL ECHOCARDIOGRAM;  Surgeon: Hugh Madura, MD;  Location: [facility] INVASIVE CV LAB;  Service: Cardiovascular;  Laterality: N/A;   TRANSTHORACIC ECHOCARDIOGRAM  09/07/2010   EF 45-50%; LV systolic function mildly reduced; LA mildly dilated; mild-mod MR & mild-mod TR; aortic root sclerosis/calcification;    HPI:  Donald Rios is an 83 yo male presenting to ED 5/1 with AMS and hypoglycemia. Recently admitted 4/1 to [facility] for sepsis secondary to LLE cellulitis complicated by hypotension. CT Chest concerning for L hemithorax opacification and patchy nodular airspace opacity in the posterior RUL and scattered throughout the RLL with dense consolidative airspace disease through the RLL, concerning for multifocal PNA. Seen by SLP 08/14/22 with WFL oropharyngeal swallow. PMH includes CAD s/p CABG, COPD, DM, ischemic cardiomyopathy with chronic HFrEF, A-fib on Eliquis , GERD    Assessment / Plan / Recommendation  Clinical Impression  Pt presents with a congested quality of voice and cough on 14L HFNC. He endorses difficulty swallowing PTA, described by a globus sensation. Pt's upper dentures are currently unavailable and his lower dentition are  in poor condition resulting in prolonged mastication with solids. His vocal quality grows progressively more wet after trials of thin liquids and he consistently coughs after swallowing. Discussed completing an MBS prior to diet initiation with pt, his wife, and RN who are all in agreement given chest imaging and respiratory status. Recommend NPO status be maintained except  for ice chips in moderation and meds which can be given in puree. RN asks that MBS be held until next date to allow admission/orientation to the unit so SLP will f/u at that time.   SLP Visit Diagnosis: Dysphagia, unspecified (R13.10)    Aspiration Risk  Moderate aspiration risk    Diet Recommendation NPO except meds    Medication Administration: Crushed with puree    Other  Recommendations Oral Care Recommendations: Oral care QID;Oral care prior to ice chip/H20    Recommendations for follow up therapy are one component of a multi-disciplinary discharge planning process, led by the attending physician.  Recommendations may be updated based on patient status, additional functional criteria and insurance authorization.  Follow up Recommendations Skilled nursing-short term rehab (<3 hours/day)      Assistance Recommended at Discharge    Functional Status Assessment Patient has had a recent decline in their functional status and demonstrates the ability to make significant improvements in function in a reasonable and predictable amount of time.  Frequency and Duration min 2x/week  2 weeks       Prognosis Prognosis for improved oropharyngeal function: Good Barriers to Reach Goals: Cognitive deficits      Swallow Study   General HPI: Donald Rios is an 83 yo male presenting to ED 5/1 with AMS and hypoglycemia. Recently admitted 4/1 to [facility] for sepsis secondary to LLE cellulitis complicated by hypotension. CT Chest concerning for L hemithorax opacification and patchy nodular airspace opacity in the posterior RUL and scattered throughout the RLL with dense consolidative airspace disease through the RLL, concerning for multifocal PNA. Seen by SLP 08/14/22 with WFL oropharyngeal swallow. PMH includes CAD s/p CABG, COPD, DM, ischemic cardiomyopathy with chronic HFrEF, A-fib on Eliquis , GERD Type of Study: Bedside Swallow Evaluation Previous Swallow Assessment: see HPI Diet Prior to this Study:  NPO Temperature Spikes Noted: No Respiratory Status: Nasal cannula (14L salter HFNC) History of Recent Intubation: No Behavior/Cognition: Alert;Cooperative Oral Cavity Assessment: Within Functional Limits Oral Care Completed by SLP: Yes Oral Cavity - Dentition: Dentures, top;Dentures, not available;Poor condition Vision: Functional for self-feeding Self-Feeding Abilities: Needs assist Patient Positioning: Upright in bed Baseline Vocal Quality: Wet Volitional Cough: Congested Volitional Swallow: Able to elicit    Oral/Motor/Sensory Function Overall Oral Motor/Sensory Function: Within functional limits   Ice Chips Ice chips: Not tested   Thin Liquid Thin Liquid: Impaired Presentation: Straw Pharyngeal  Phase Impairments: Cough - Immediate    Nectar Thick Nectar Thick Liquid: Not tested   Honey Thick Honey Thick Liquid: Not tested   Puree Puree: Within functional limits Presentation: Spoon   Solid     Solid: Impaired Presentation: Self Fed Oral Phase Functional Implications: Impaired mastication      Amil Kale, M.A., CCC-SLP Speech Language Pathology, Acute Rehabilitation Services  Secure Chat preferred [phone]  05/12/2023,1:13 PM

## 2023-05-12 NOTE — Consult Note (Addendum)
 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  Sacrum Stage 3 Pressure Injury; L ischium Stage 3 Pressure Injury  2.  Full thickness B knees, L forearm, R shin  3. Right posterior heel full thickness likely r/t PVD  4.  L posterior heel, L plantar foot, L lateral foot, base of L great toe  distal L great toe full thickness r/t PVD  5.  Amputation site L 5th digit with sutures intact  (brown eschar noted between 4th and 5th digits)  Pressure Injury POA: Yes  Measurement: see nursing flowsheet  Wound bed: 1.  Sacrum Stage 3  ulcerations with varying amounts of necrotic tissue, L ischium 75% pink 25% yellow  2.  B knees with dry brown necrotic tissue, 3. L forearm and R shin red and moist  4. R posterior heel brown necrotic tissue, L heel and plantar foot necrotic tissue, L lateral foot wounds necrotic tissue  5.  L 5th digit sutures intact  Drainage (amount, consistency, odor) see nursing flowsheet  Periwound: purple red discoloration noted to feet likely from PVD  Dressing procedure/placement/frequency:  Cleanse sacrum/buttocks/ L ischial wounds with Vashe wound cleanser [employer]m Foot [phone]), do not rinse and allow to air dry.  Apply Medihoney to wound beds daily, cover with dry gauze and silicone foam. Cleanse L forearm and R shin wounds with NS, apply Xeroform gauze to wound beds daily, cover with silicone foam.  SOAK OFF XEROFORM IF STUCK TO WOUND BEDS FOR ATRAUMATIC REMOVAL.  Cleanse B knee wounds with Vashe, apply Medihoney to wound beds daily, cover with dry gauze and silicone foams.  Cleanse R posterior heel, L posterior heel, L plantar foot and L lateral and medial foot wounds with Vashe wound cleanser, do not rinse and  allow to air dry. Apply Medihoney to wound beds daily, cover with dry gauze and silicone foam or Kerlix roll gauze whichever is preferred.  Cleanse L 4th and 5th toes (suture site) with Vashe and apply Xeroform gauze ([place] [phone]) to areas daily, cover with dry gauze and silicone foam or Kerlix roll gauze.   Place bilateral feet in Prevalon boots to offload pressure. Timm Foot [phone]  POC discussed with bedside nurse. WOC team will not follow. Re-consult if further needs arise.   Thank you,    Ronni Colace MSN, RN-BC, [employer] [phone]

## 2023-05-12 NOTE — Consult Note (Signed)
 NAME:  Donald Rios, MRN:  [id number], DOB:  06/10/1940, LOS: 0 ADMISSION DATE:  05/12/2023, CONSULTATION DATE: 5/1 REFERRING MD: Dr. Nichole Barker, CHIEF COMPLAINT: Hypotension  History of Present Illness:  83 year old male with past medical history as below, which is significant for CAD status post CABG, COPD, diabetes mellitus, ischemic cardiomyopathy with chronic HFrEF, and atrial fibrillation on Eliquis .  He was recently admitted 4/1 to [facility] for sepsis secondary to left lower extremity cellulitis.  Course was complicated by hypotension which was felt to be secondary to hypovolemia, sepsis, or adrenal insufficiency.  He was started on midodrine  and fludrocortisone  with improvement.  He was treated for cellulitis with antibiotics and was discharged to SNF.  He has since been discharged from SNF to home.  He was in his usual state of health until approximately 4/30 when he became lethargic around 2 PM.  EMS was called and he was found to be hypoglycemic to 60 which improved with dextrose  administration.  He was transported to [facility] emergency department for further evaluation.  Initially upon arrival he was minimally responsive, but improved over the course of his stay in the ED.  He was hypoxemic to the mid 80s on room air and hypotensive.  Imaging of the chest was concerning for L hemithorax opacification and CT was done showing moderal left pleural effusion and atelectasis vs pneumonia as well as a right sided pneumonia. He was placed on BiPAP for hypoxia and started on empiric antibiotics. He was admitted to the hospitalists. Despite BiPAP he remained somnolent. And hypotension persisted as well. PCCM was asked to evaluate for ICU transfer.   Pertinent  Medical History   has a past medical history of Acute gastric ulcer with hemorrhage (05/06/2013), Arthritis, Asthma, Atrial enlargement, left, 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, Cataract, COPD (chronic obstructive pulmonary disease)  (HCC), Diabetes mellitus, Diabetic nephropathy (HCC), Diverticulosis, ED (erectile dysfunction), GERD (gastroesophageal reflux disease), Gout, Hearing loss, Hiatal hernia (05/2008), History of MI (myocardial infarction), History of nuclear stress test (08/07/2010), Hyperlipidemia, Hyperplastic colon polyp (05/2008), Hypertension, Ischemic cardiomyopathy, Myocardial infarction (HCC), and Valvular regurgitation.   Significant Hospital Events: Including procedures, antibiotic start and stop dates in addition to other pertinent events     Interim History / Subjective:    Objective   Blood pressure (!) 87/57, pulse (!) 59, temperature 97.8 F (36.6 C), temperature source Rectal, resp. rate (!) 23, height 5\' 8"  (1.727 m), weight 64 kg, SpO2 100%.    FiO2 (%):  [100 %] 100 % PEEP:  [6 cmH20] 6 cmH20 Pressure Support:  [12 cmH20-16 cmH20] 16 cmH20   Intake/Output Summary (Last 24 hours) at 05/12/2023 0912 Last data filed at 05/12/2023 0755 Gross per 24 hour  Intake 1928.51 ml  Output --  Net 1928.51 ml   Filed Weights   05/12/23 0403  Weight: 64 kg    Examination: General: Elderly, chronically ill appearing male in NAD HENT: [place]/AT, PERRL, no JVD Lungs: Rhonchi throughout all lung fields.  Cardiovascular: IRIR regular rate. No MRG Abdomen: Soft, NT, ND Extremities: Multiple ulcerations on left foot and well and R lateral lower extremity pictured below. 2+ pitting edema to bilateral lower extremities.  Neuro: Alert, oriented, non-focal           Resolved Hospital Problem list     Assessment & Plan:   Acute respiratory failure: multifocal pneumonia + pleural effusion. - Transitioned from BiPAP to high flow and tolerating well - Keep sats > 90% - Empiric  HAP treatment with cefepime /vancomycin  - Planning for IR thoracentesis - Pulmonary hygiene  - Sputum culture if able.   Sepsis secondary to PNA, osteomyelitis Hypotension - Admit to ICU - Hypotensive but no evidence of  end organ damage - Stress steroids - Gentle IVF - Midodrine  - Will start NE and have PICC placed.  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  - WOC consult  Chronic HFrEF: LVEF 25% secondary to ischemic cardiomyopathy - hold home metoprolol  and spironolactone  for now - check trop  Atrial fibrillation - Holding Eliquis  in anticipation of possible surgery - Amiodarone  continue - Can start heparin  if necessary for [facility]  Hypokalemia Hypomagnesemia - Replace K, Mg - Trend lytes  QT prolongation - Replace mag  Normocytic anemia - Hemoglobin at his recent baseline - trend CBC - Transfuse for hemoglobin < 7  DM - CBG monitoring and SSI   Best Practice (right click and "Reselect all SmartList Selections" daily)   Diet/type: NPO DVT prophylaxis DOAC - on hold Pressure ulcer(s): identified on: 01/May/2025 sacral and foot present on admission GI prophylaxis: N/A Lines: N/A Foley:  N/A Code Status:  full code Last date of multidisciplinary goals of care discussion [ ]   Labs   CBC: Recent Labs  Lab 05/12/23 0327 05/12/23 0756  WBC 9.1  --   NEUTROABS 7.9*  --   HGB 7.7* 8.2*  HCT 25.5* 24.0*  MCV 98.1  --   PLT 214  --     Basic Metabolic Panel: Recent Labs  Lab 05/12/23 0327 05/12/23 0530 05/12/23 0756  NA 139  --  135  K 2.0*  --  2.7*  CL 101  --   --   CO2 30  --   --   GLUCOSE 118*  --   --   BUN 8  --   --   CREATININE 0.90  --   --   CALCIUM  7.2*  --   --   MG  --  0.8*  --    GFR: Estimated Creatinine Clearance: 58.3 mL/min (by C-G formula based on SCr of 0.9 mg/dL). Recent Labs  Lab 05/12/23 0327 05/12/23 0334 05/12/23 0530 05/12/23 0541  PROCALCITON  --   --  <0.10  --   WBC 9.1  --   --   --   LATICACIDVEN  --  1.4  --  0.6    Liver Function Tests: Recent Labs  Lab 05/12/23 0327  AST 24  ALT 13   ALKPHOS 81  BILITOT 1.1  PROT 4.0*  ALBUMIN  1.7*   No results for input(s): "LIPASE", "AMYLASE" in the last 168 hours. No results for input(s): "AMMONIA" in the last 168 hours.  ABG    Component Value Date/Time   PHART 7.398 05/12/2023 0756   PCO2ART 39.5 05/12/2023 0756   PO2ART 85 05/12/2023 0756   HCO3 24.4 05/12/2023 0756   TCO2 26 05/12/2023 0756   O2SAT 96 05/12/2023 0756     Coagulation Profile: No results for input(s): "INR", "PROTIME" in the last 168 hours.  Cardiac Enzymes: No results for input(s): "CKTOTAL", "CKMB", "CKMBINDEX", "TROPONINI" in the last 168 hours.  HbA1C: Hgb A1c MFr Bld  Date/Time Value Ref Range Status  02/08/2023 10:52 PM 6.2 (H) 4.8 - 5.6 % Final    Comment:    (NOTE) Pre diabetes:          5.7%-6.4%  Diabetes:              >6.4%  Glycemic control for   <7.0% adults with diabetes   08/14/2022 03:17 AM 5.8 (H) 4.8 - 5.6 % Final    Comment:    (NOTE) Pre diabetes:          5.7%-6.4%  Diabetes:              >6.4%  Glycemic control for   <7.0% adults with diabetes     CBG: Recent Labs  Lab 05/12/23 0323 05/12/23 0446 05/12/23 0520 05/12/23 0652 05/12/23 0742  GLUCAP 108* 66* 155* 141* 169*    Review of Systems:   Bolds are positive  Constitutional: weight loss, gain, night sweats, Fevers, chills, fatigue .thirst  HEENT: headaches, Sore throat, sneezing, nasal congestion, post nasal drip, Difficulty swallowing, Tooth/dental problems, visual complaints visual changes, ear ache CV:  chest pain, radiates:,Orthopnea, PND, swelling in lower extremities, dizziness, palpitations, syncope.  GI  heartburn, indigestion, abdominal pain, nausea, vomiting, diarrhea, change in bowel habits, loss of appetite, bloody stools.  Resp: cough, productive: , hemoptysis, dyspnea, chest pain, pleuritic.  Skin: rash or itching or icterus GU: dysuria, change in color of urine, urgency or frequency. flank pain, hematuria  MS: joint pain or  swelling. decreased range of motion  Psych: change in mood or affect. depression or anxiety.  Neuro: difficulty with speech, weakness, numbness, ataxia    Past Medical History:  He,  has a past medical history of Acute gastric ulcer with hemorrhage (05/06/2013), Arthritis, Asthma, Atrial enlargement, left, 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, Cataract, COPD (chronic obstructive pulmonary disease) (HCC), Diabetes mellitus, Diabetic nephropathy (HCC), Diverticulosis, ED (erectile dysfunction), GERD (gastroesophageal reflux disease), Gout, Hearing loss, Hiatal hernia (05/2008), History of MI (myocardial infarction), History of nuclear stress test (08/07/2010), Hyperlipidemia, Hyperplastic colon polyp (05/2008), Hypertension, Ischemic cardiomyopathy, Myocardial infarction (HCC), and Valvular regurgitation.   Surgical History:   Past Surgical History:  Procedure Laterality Date   ABDOMINAL AORTOGRAM W/LOWER EXTREMITY N/A 02/14/2023   Procedure: ABDOMINAL AORTOGRAM W/LOWER EXTREMITY;  Surgeon: Philipp Brawn, MD;  Location[facility] INVASIVE CV LAB;  Service: Cardiovascular;  Laterality: N/A;   AMPUTATION Left 03/22/2023   Procedure: FIFTH TOE AMPUTATION LEFT FOOT;  Surgeon: Philipp Brawn, MD;  Location[facility];  Service: Vascular;  Laterality: Left;   CARDIOVERSION N/A 08/16/2022   Procedure: CARDIOVERSION;  Surgeon: Hugh Madura, MD;  Location: [facility] INVASIVE CV LAB;  Service: Cardiovascular;  Laterality: N/A;   CORONARY ARTERY BYPASS GRAFT  2000   LIMA to LAD, free IMA to OM2, sequential graft to PLA & PLD   ESOPHAGOGASTRODUODENOSCOPY N/A 05/07/2013   Procedure: ESOPHAGOGASTRODUODENOSCOPY (EGD);  Surgeon: Kenney Peacemaker, MD;  Location: [facility] ENDOSCOPY;  Service: Endoscopy;  Laterality: N/A;   PERIPHERAL VASCULAR INTERVENTION Left 02/14/2023   Procedure: PERIPHERAL VASCULAR INTERVENTION;  Surgeon: Philipp Brawn, MD;  Location: [facility] INVASIVE CV LAB;  Service: Cardiovascular;  Laterality: Left;  SFA   TEE  WITHOUT CARDIOVERSION N/A 08/16/2022   Procedure: TRANSESOPHAGEAL ECHOCARDIOGRAM;  Surgeon: Hugh Madura, MD;  Location: [facility] INVASIVE CV LAB;  Service: Cardiovascular;  Laterality: N/A;   TRANSTHORACIC ECHOCARDIOGRAM  09/07/2010   EF 45-50%; LV systolic function mildly reduced; LA mildly dilated; mild-mod MR & mild-mod TR; aortic root sclerosis/calcification;      Social History:   reports that he quit smoking about 37 years ago. His smoking use included cigarettes. He has never used smokeless tobacco. He reports that  he does not currently use alcohol. He reports that he does not use drugs.   Family History:  His family history includes Diabetes in his brother; Esophageal cancer in his brother; Hypertension in his father; Stomach cancer in his maternal aunt; Stroke in his father, maternal grandfather, and maternal grandmother. There is no history of Colon cancer or Rectal cancer.   Allergies No Known Allergies   Home Medications  Prior to Admission medications   Medication Sig Start Date End Date Taking? Authorizing Provider  acetaminophen  (TYLENOL ) 325 MG tablet Take 2 tablets (650 mg total) by mouth every 6 (six) hours as needed for mild pain (pain score 1-3) (or Fever >/= 101). 04/19/23  Yes Hongalgi, Thomasene Flemings, MD  allopurinol  (ZYLOPRIM ) 300 MG tablet TAKE 1 TABLET BY MOUTH DAILY 04/26/23  Yes Austine Lefort, MD  amiodarone  (PACERONE ) 200 MG tablet Take 1 tablet (200 mg total) by mouth every morning. 04/19/23  Yes Hongalgi, Anand D, MD  doxazosin  (CARDURA ) 2 MG tablet Take 2 mg by mouth daily. 04/29/23  Yes [provider]  ELIQUIS  5 MG TABS tablet TAKE 1 TABLET BY MOUTH 2 TIMES A DAY 03/23/23  Yes Austine Lefort, MD  glipiZIDE  (GLUCOTROL  XL) 5 MG 24 hr tablet Take 5 mg by mouth daily. 04/28/23  Yes [provider]  metoprolol  tartrate (LOPRESSOR ) 50 MG tablet Take 1.5 tablets (75 mg total) by mouth 2 (two) times daily. 04/19/23  Yes Hongalgi, Anand D, MD  mirtazapine  (REMERON )  30 MG tablet TAKE 1 TABLET BY MOUTH EVERY NIGHT AT BEDTIME FOR APPETITE Patient taking differently: Take 30 mg by mouth at bedtime. TAKE 1 TABLET BY MOUTH EVERY NIGHT AT BEDTIME FOR APPETITE 03/07/23  Yes Austine Lefort, MD  NOVOLOG  FLEXPEN 100 UNIT/ML FlexPen Inject 0-9 Units into the skin 3 (three) times daily with meals as needed for high blood sugar.  Inject 0-9 Units into the skin 3 (three) times daily with meals. 0-9 Units, Subcutaneous, 3 times daily with meals, First dose on Wed 04/13/23 at 0800 Correction coverage: Sensitive (thin, NPO, renal) CBG < 70: Implement Hypoglycemia protocol, CBG 70 - 120: 0 units CBG 121 - 150: 1 unit CBG 151 - 200: 2 units CBG 201 - 250: 3 units CBG 251 - 300: 5 units CBG 301 - 350: 7 units CBG 351 - 400: 9 units CBG > 400: call MD. 04/ 05/09/23  Yes [provider]  simvastatin  (ZOCOR ) 40 MG tablet Take 40 mg by mouth daily.   Yes [provider]  spironolactone  (ALDACTONE ) 25 MG tablet Take 25 mg by mouth 2 (two) times daily.   Yes [provider]  Tocilizumab  (ACTEMRA  IV) Inject 6 mg/kg into the vein every 28 (twenty-eight) days.   Yes [provider]     Critical care time: 48 mins     Roz Cornelia, AGACNP-BC [facility]  See Amion for personal pager PCCM on call pager [phone] until 7pm. Please call Elink 7p-7a. [phone]  05/12/2023 11:18 AM

## 2023-05-12 NOTE — ED Notes (Signed)
 Upon arrival, Antibiotics started prior to 2nd blood culture. Patient extremely hard stick due to anasarca

## 2023-05-12 NOTE — ED Triage Notes (Signed)
 Pt BIB [facility] from home. Pts wife call 911 due to pt being lethargic and hard to wakeup. On EMS arrival pt CBG 60, D10 given. CBG brought up to 220. Pt still lethargic, only responding to painful stimuli.  HR 40s BP 99/75

## 2023-05-12 NOTE — H&P (Addendum)
 History and Physical    Donald Rios BJY:[id number] DOB: Dec 22, 1940 DOA: 05/12/2023  PCP: Austine Lefort, MD   Patient coming from: home   Chief Complaint: lethargic, hypoglycemic   HPI: Donald Rios is a 83 y.o. male with medical history significant for type 2 diabetes mellitus, COPD, temporal arteritis, CAD, chronic HFrEF, atrial fibrillation on Eliquis , PAD, gout, chronic anemia, and recent admission for left lower extremity cellulitis complicated by hypotension, rapid atrial fibrillation, and encephalopathy who now returns with lethargy and hypoglycemia.  Patient's wife reports that the patient had recently returned home from an SNF, had a fairly normal day yesterday initially, but became lethargic around 2 PM.  He went to sleep and his wife was having difficulty waking him.  She eventually called EMS, CBG was 60, IV dextrose  was administered with improvement in CBG to 220, but the patient remained lethargic and was brought into the ED.  He was responsive to painful stimuli only initially, but his condition has improved in the emergency department and he denies chest pain, abdominal pain, vomiting, or diarrhea.  He reports intermittent dysuria and recent shortness of breath with increase in his chronic cough.  ED Course: Upon arrival to the ED, patient is found to be afebrile and saturating mid 80s on room air with systolic blood pressure in the 90s.  Labs are most notable for potassium 2.0, albumin  1.7, normal WBC, hemoglobin 7.7, normal lactic acid, troponin 81, and CRP 6.5.  Chest x-ray is concerning for near complete opacification of the left hemithorax which is favored to represent a pleural effusion and atelectasis.  Head CT was negative for acute findings.  Plain radiographs of the left foot demonstrate acute osteomyelitis involving the left fifth metatarsal head.  Blood cultures were ordered and the patient was treated with 1 L NS, IV potassium, IV dextrose , vancomycin , and  Rocephin  in the ED.  Review of Systems:  All other systems reviewed and apart from HPI, are negative.  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    History of MI (myocardial infarction)    History of nuclear stress test 08/07/2010   dipyridamole; EKG negative for ischemia, low risk scan    Hyperlipidemia    Hyperplastic colon polyp 05/2008   Hypertension    Ischemic cardiomyopathy    EF 45%, with inferior wall motion abnormality    Myocardial infarction (HCC)    Valvular regurgitation    mitral and tricuspid (mild)    Past Surgical History:  Procedure Laterality Date   ABDOMINAL AORTOGRAM W/LOWER EXTREMITY N/A 02/14/2023   Procedure: ABDOMINAL AORTOGRAM W/LOWER EXTREMITY;  Surgeon: Philipp Brawn, MD;  Location: [facility]AS[facility];  Service: Cardiovascular;  Laterality: N/A;   AMPUTATION Left 03/22/2023   Procedure: FIFTH TOE AMPUTATION LEFT FOOT;  Surgeon: Philipp Brawn, MD;  Location: [facility] OR;  Service: Vascular;  Laterality: Left;   CARDIOVERSION N/A 08/16/2022   Procedure: CARDIOVERSION;  Surgeon: Dorothye Gathers  C, MD;  Location: [facility] CV [facility];  Service: Cardiovascular;  Laterality: N/A;   CORONARY ARTERY BYPASS GRAFT  2000   LIMA to LAD, free IMA to OM2, sequential graft to PLA & PLD   ESOPHAGOGASTRODUODENOSCOPY N/A 05/07/2013   Procedure: ESOPHAGOGASTRODUODENOSCOPY (EGD);  Surgeon: Kenney Peacemaker, MD;  Location: [facility] ENDOSCOPY;  Service: Endoscopy;  Laterality: N/A;   PERIPHERAL VASCULAR INTERVENTION Left 02/14/2023   Procedure: PERIPHERAL  VASCULAR INTERVENTION;  Surgeon: Philipp Brawn, MD;  Location: [facility] INVASIVE CV LAB;  Service: Cardiovascular;  Laterality: Left;  SFA   TEE WITHOUT CARDIOVERSION N/A 08/16/2022   Procedure: TRANSESOPHAGEAL ECHOCARDIOGRAM;  Surgeon: Hugh Madura, MD;  Location: [facility];  Service: Cardiovascular;  Laterality: N/A;   TRANSTHORACIC ECHOCARDIOGRAM  09/07/2010   EF 45-50%; LV systolic function mildly reduced; LA mildly dilated; mild-mod MR & mild-mod TR; aortic root sclerosis/calcification;     Social History:   reports that he quit smoking about 37 years ago. His smoking use included cigarettes. He has never used smokeless tobacco. He reports that he does not currently use alcohol. He reports that he does not use drugs.  No Known Allergies  Family History  Problem Relation Age of Onset   Stroke Father    Hypertension Father    Diabetes Brother    Esophageal cancer Brother    Stroke Maternal Grandmother    Stroke Maternal Grandfather    Stomach cancer Maternal Aunt    Colon cancer Neg Hx    Rectal cancer Neg Hx      Prior to Admission medications   Medication Sig Start Date End Date Taking? Authorizing Provider  acetaminophen  (TYLENOL ) 325 MG tablet Take 2 tablets (650 mg total) by mouth every 6 (six) hours as needed for mild pain (pain score 1-3) (or Fever >/= 101). 04/19/23   Hongalgi, Thomasene Flemings, MD  allopurinol  (ZYLOPRIM ) 300 MG tablet TAKE 1 TABLET BY MOUTH DAILY 04/26/23   Austine Lefort, MD  amiodarone  (PACERONE ) 200 MG tablet Take 1 tablet (200 mg total) by mouth every morning. 04/19/23   Hongalgi, Anand D, MD  ascorbic acid  (VITAMIN C ) 250 MG tablet Take 1 tablet (250 mg total) by mouth 2 (two) times daily. 04/19/23   Hongalgi, Anand D, MD  atorvastatin  (LIPITOR) 40 MG tablet Take 1 tablet (40 mg total) by mouth daily. 04/20/23   Hongalgi, Anand D, MD  bisacodyl  (DULCOLAX) 10 MG suppository Place 1 suppository (10 mg total) rectally daily as needed for moderate constipation.  04/19/23   Hongalgi, Anand D, MD  cholecalciferol  (CHOLECALCIFEROL ) 25 MCG tablet Take 1 tablet (1,000 Units total) by mouth daily. 04/20/23   Hongalgi, Anand D, MD  cyanocobalamin  (VITAMIN B12) 500 MCG tablet Take 1 tablet (500 mcg total) by mouth daily. 04/20/23   Hongalgi, Anand D, MD  digoxin  (LANOXIN ) 0.125 MG tablet Take 1 tablet (0.125 mg total) by mouth daily. 04/20/23   Hongalgi, Anand D, MD  ELIQUIS  5 MG TABS tablet TAKE 1 TABLET BY MOUTH 2 TIMES A DAY 03/23/23   Austine Lefort, MD  empagliflozin  (JARDIANCE ) 10 MG TABS tablet Take 1 tablet (10 mg total) by mouth daily. Patient taking differently: Take 10 mg by mouth in the morning. 08/16/22   Sanjuanita Cruz, NP  feeding supplement (ENSURE ENLIVE / ENSURE PLUS) LIQD Take 237 mLs by mouth 3 (three) times daily between meals. 04/19/23   Hongalgi, Anand D, MD  fludrocortisone  (FLORINEF ) 0.1 MG tablet Take 2 tablets (0.2 mg  total) by mouth 2 (two) times daily. 04/19/23   Hongalgi, Anand D, MD  insulin  aspart (NOVOLOG ) 100 UNIT/ML injection Inject 0-9 Units into the skin 3 (three) times daily with meals. 0-9 Units, Subcutaneous, 3 times daily with meals, First dose on Wed 04/13/23 at 0800 Correction coverage: Sensitive (thin, NPO, renal) CBG < 70: Implement Hypoglycemia protocol, CBG 70 - 120: 0 units CBG 121 - 150: 1 unit CBG 151 - 200: 2 units CBG 201 - 250: 3 units CBG 251 - 300: 5 units CBG 301 - 350: 7 units CBG 351 - 400: 9 units CBG > 400: call MD. 04/19/23   Hongalgi, Anand D, MD  metoprolol  tartrate (LOPRESSOR ) 50 MG tablet Take 1.5 tablets (75 mg total) by mouth 2 (two) times daily. 04/19/23   Hongalgi, Anand D, MD  midodrine  (PROAMATINE ) 5 MG tablet Take 3 tablets (15 mg total) by mouth 3 (three) times daily with meals. 04/19/23   Hongalgi, Anand D, MD  mirtazapine  (REMERON ) 30 MG tablet TAKE 1 TABLET BY MOUTH EVERY NIGHT AT BEDTIME FOR APPETITE Patient taking differently: Take 30 mg by mouth at bedtime. TAKE 1 TABLET BY MOUTH EVERY NIGHT AT BEDTIME FOR  APPETITE 03/07/23   Austine Lefort, MD  Multiple Vitamin (MULTIVITAMIN WITH MINERALS) TABS tablet Take 1 tablet by mouth daily. 04/20/23   Hongalgi, Anand D, MD  pantoprazole  (PROTONIX ) 40 MG tablet TAKE 1 TABLET BY MOUTH DAILY Patient taking differently: Take 40 mg by mouth in the morning. 03/04/23   Austine Lefort, MD  polyethylene glycol (MIRALAX  / GLYCOLAX ) 17 g packet Take 17 g by mouth daily. 04/19/23   Hongalgi, Anand D, MD  senna-docusate (SENOKOT-S) 8.6-50 MG tablet Take 1 tablet by mouth at bedtime. 04/19/23   Hongalgi, Anand D, MD  thiamine  (VITAMIN B-1) 100 MG tablet Take 1 tablet (100 mg total) by mouth daily. 04/20/23   Hongalgi, Anand D, MD  Tocilizumab  (ACTEMRA  IV) Inject 6 mg/kg into the vein every 28 (twenty-eight) days.    [provider]  vitamin A  3 MG (10000 UNITS) capsule Take 1 capsule (10,000 Units total) by mouth daily. 04/20/23   Hongalgi, Anand D, MD  zinc  sulfate, 50mg  elemental zinc , 220 (50 Zn) MG capsule Take 1 capsule (220 mg total) by mouth daily. 04/20/23   Casey Clay, MD    Physical Exam: Vitals:   05/12/23 0415 05/12/23 0430 05/12/23 0445 05/12/23 0500  BP: (!) 117/50 (!) 123/56 (!) 112/39 (!) 116/51  Pulse: 62 65 65 71  Resp: 17 20 17 18   Temp:   97.8 F (36.6 C)   TempSrc:   Rectal   SpO2: 100% 100% 100% 100%  Weight:      Height:        Constitutional: NAD, no pallor or diaphoresis   Eyes: PERTLA, lids and conjunctivae normal ENMT: Mucous membranes are dry. Posterior pharynx clear of any exudate or lesions.   Neck: supple, no masses  Respiratory: Diminished on left, no wheezing. No accessory muscle use.  Cardiovascular: S1 & S2 heard, regular rate and rhythm. Pretibial pitting edema.  Abdomen: Soft, no tenderness. Bowel sounds active.  Musculoskeletal: no clubbing / cyanosis. No joint deformity upper and lower extremities.   Skin: Left foot and posterior leg ulcerations. Warm, dry, well-perfused. Neurologic: CN 2-12 grossly intact.  Moving all extremities. Sleeping. Wakes to voice and oriented to person, place, and situation.    Psychiatric: Calm. Cooperative.    Labs and Imaging on Admission: I have  personally reviewed following labs and imaging studies  CBC: Recent Labs  Lab 05/12/23 0327  WBC 9.1  NEUTROABS 7.9*  HGB 7.7*  HCT 25.5*  MCV 98.1  PLT 214   Basic Metabolic Panel: Recent Labs  Lab 05/12/23 0327  NA 139  K 2.0*  CL 101  CO2 30  GLUCOSE 118*  BUN 8  CREATININE 0.90  CALCIUM  7.2*   GFR: Estimated Creatinine Clearance: 58.3 mL/min (by C-G formula based on SCr of 0.9 mg/dL). Liver Function Tests: Recent Labs  Lab 05/12/23 0327  AST 24  ALT 13  ALKPHOS 81  BILITOT 1.1  PROT 4.0*  ALBUMIN  1.7*   No results for input(s): "LIPASE", "AMYLASE" in the last 168 hours. No results for input(s): "AMMONIA" in the last 168 hours. Coagulation Profile: No results for input(s): "INR", "PROTIME" in the last 168 hours. Cardiac Enzymes: No results for input(s): "CKTOTAL", "CKMB", "CKMBINDEX", "TROPONINI" in the last 168 hours. BNP (last 3 results) No results for input(s): "PROBNP" in the last 8760 hours. HbA1C: No results for input(s): "HGBA1C" in the last 72 hours. CBG: Recent Labs  Lab 05/12/23 0323 05/12/23 0446 05/12/23 0520  GLUCAP 108* 66* 155*   Lipid Profile: No results for input(s): "CHOL", "HDL", "LDLCALC", "TRIG", "CHOLHDL", "LDLDIRECT" in the last 72 hours. Thyroid  Function Tests: No results for input(s): "TSH", "T4TOTAL", "FREET4", "T3FREE", "THYROIDAB" in the last 72 hours. Anemia Panel: No results for input(s): "VITAMINB12", "FOLATE", "FERRITIN", "TIBC", "IRON", "RETICCTPCT" in the last 72 hours. Urine analysis:    Component Value Date/Time   COLORURINE AMBER (A) 05/12/2023 0430   APPEARANCEUR HAZY (A) 05/12/2023 0430   LABSPEC 1.020 05/12/2023 0430   PHURINE 5.0 05/12/2023 0430   GLUCOSEU >=500 (A) 05/12/2023 0430   HGBUR NEGATIVE 05/12/2023 0430   BILIRUBINUR  NEGATIVE 05/12/2023 0430   KETONESUR 5 (A) 05/12/2023 0430   PROTEINUR NEGATIVE 05/12/2023 0430   UROBILINOGEN 0.2 05/06/2013 2202   NITRITE NEGATIVE 05/12/2023 0430   LEUKOCYTESUR NEGATIVE 05/12/2023 0430   Sepsis Labs: @LABRCNTIP (procalcitonin:4,lacticidven:4) )No results found for this or any previous visit (from the past 240 hours).   Radiological Exams on Admission: CT HEAD WO CONTRAST Result Date: 05/12/2023 EXAM: CT HEAD WITHOUT 05/12/2023 03:55:56 AM TECHNIQUE: CT of the head was performed without the administration of intravenous contrast. Automated exposure control, iterative reconstruction, and/or weight based adjustment of the mA/kV was utilized to reduce the radiation dose to as low as reasonably achievable. COMPARISON: 04/25/2023 CLINICAL HISTORY: Mental status change, unknown cause. FINDINGS: BRAIN AND VENTRICLES: There is no acute intracranial hemorrhage, mass effect or midline shift. No abnormal extra-axial fluid collection. The gray-white differentiation is maintained without evidence of an acute infarct. There is no evidence of hydrocephalus. Global cortical atrophy. Subcortical and periventricular small vessel ischemic changes. ORBITS: The visualized portion of the orbits demonstrate no acute abnormality. SINUSES: The visualized paranasal sinuses and mastoid air cells demonstrate no acute abnormality. SOFT TISSUES AND SKULL: No acute abnormality of the visualized skull or soft tissues. IMPRESSION: 1. No acute intracranial abnormality. 2. Atrophy with small vessel ischemic changes. Electronically signed by: Zadie Herter MD 05/12/2023 03:59 AM EDT RP Workstation: [id number]   DG Foot 2 Views Left Result Date: 05/12/2023 EXAM: 2 VIEW(S) XRAY OF THE LEFT FOOT 05/12/2023 03:46:00 AM COMPARISON: None available. CLINICAL HISTORY: Possible osteomyelitis. Encounter for altered mental status, possible osteomyelitis. FINDINGS: BONES AND JOINTS: Prior fifth digit amputation at the level of  the mid proximal phalanx. Destructive changes involving the fifth metatarsal head, suggesting acute  osteomyelitis. Small plantar and posterior calcaneal osteophytes. SOFT TISSUES: Adjacent soft tissue swelling/ulcer. Vascular calcifications. IMPRESSION: 1. Destructive changes involving the left fifth metatarsal head, suggesting acute osteomyelitis, with adjacent soft tissue swelling/ulcer. 2. 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 Electronically signed by: Zadie Herter MD 05/12/2023 03:50 AM EDT RP Workstation: [id number]   DG Chest Port 1 View Result Date: 05/12/2023 EXAM: 1 VIEW XRAY OF THE CHEST 05/12/2023 03:45:00 AM COMPARISON: 04/25/2023 CLINICAL HISTORY: Altered mental status. Encounter for altered mental status, septic foot, ulcer on the side and swollen. FINDINGS: LUNGS AND PLEURA: Near complete opacification of the left hemithorax, favoring a moderate to large left pleural effusion with underlying atelectasis, although obscured by defibrillator pads. The right lung is clear. HEART AND MEDIASTINUM: Postsurgical changes related to prior CABG. Median sternotomy. BONES AND SOFT TISSUES: No acute osseous abnormality. IMPRESSION: 1. Near complete opacification of the left hemithorax, favoring a moderate to large left pleural effusion with underlying atelectasis, although obscured. Electronically signed by: Zadie Herter MD 05/12/2023 03:49 AM EDT RP Workstation: [id number]    EKG: Independently reviewed. Sinus or ectopic atrial rhythm, 1st degree AV block, LVH with IVCD, LAD, and repolarization abnormality.   Assessment/Plan   1. Left pleural effusion; acute hypoxic respiratory failure  - Saturating mid-80s on room air in ED; CXR concerning for near-complete opacification of left hemithorax  - Check chest CT, continue supplemental O2 as-needed, continue empiric antibiotics    Addendum: Oxygen saturations now staying in 80s despite 15 Lpm supplemental O2.  Appreciate Dr. Mason Sole of [facility] reviewing the case; will start BiPAP and consult IR for thoracentesis.    2. Acute osteomyelitis  - Plain films concerning for acute osteomyelitis involving the 5th metatarsal head on the left  - Continue empiric antibiotics, discuss with surgery in the morning    3. Hypoglycemia; type II DM  - A1c was 6.2% in January 2025  - Continue IV dextrose  infusion for now with frequent CBGs    4. Hypokalemia  - Replacing    5. Anemia  - Hgb is 7.7 on admission, down from 10.4 on 04/25/23  - No overt bleeding  - Hold Eliquis  for now, follow-up on FOBT, trend CBCs    6. Chronic HFrEF  - Appears compensated  - Monitor weight and I/Os, hold beta-blocker in light of initial hypotension   7. Atrial fibrillation  - Hold Eliquis  and metoprolol  for now given concern for acute on chronic anemia and hypotension on presentation, continue amiodarone  and digoxin  as tolerated    8. Acute encephalopathy  - No acute findings on head CT   - He is improving with treatment of hypoglycemia in the ED  - Anticipate continued improvement with treatment of suspected infection, hypoxia, hypoglycemia, hypokalemia   9. Hypotension  - MAP now >65 with 1 liter NS in ED  - Continue midodrine , use stress-dose steroids for now   10. CAD; elevated troponin  - Troponin is 81 in ED  - He denies any recent chest pain and suspicion for ACS is low  - Treat underlying illness as above, repeat troponin, continue Lipitor     DVT prophylaxis: SCDs   Code Status: Full, discussed with patient and his wife in the ED  Level of Care: Level of care: [facility] Family Communication: Wife at bedside  Disposition Plan:  Patient is from: Home  Anticipated d/c is to: SNF  Anticipated d/c date is: TBD Patient currently: pending chest CT, stable BP, stable glucose,  stable H&H  Consults called: None   Admission status: Inpatient    Walton Guppy, MD [facility]  05/12/2023, 5:34 AM

## 2023-05-12 NOTE — Progress Notes (Signed)
   05/12/23 0958  Therapy Vitals  Temp (!) 97.3 F (36.3 C)  Temp Source Oral  MEWS Score/Color  MEWS Score 3  MEWS Score Color Yellow  Oxygen Therapy/Pulse Ox  O2 Device (S)  HFNC  O2 Therapy Oxygen humidified  O2 Flow Rate (L/min) 14 L/min    Pt taken off BIPAP and placed on 14L salter per CCM. Pt is tolerating well at this time. RT will monitor. BIPAP on standby.

## 2023-05-12 NOTE — ED Notes (Signed)
 Spoke with MD who is going to come down and assess patient as patient appears to unstable to go to [facility].

## 2023-05-12 NOTE — Progress Notes (Addendum)
 Pharmacy Antibiotic Note  TREMAR DUPLANTIER is a 83 y.o. male admitted on 05/12/2023 with acute respiratory failure and concern for pneumonia. Also concern for acute osteomyelitis of toe and plans  Pharmacy has been consulted for vancomycin  dosing.  Plan: Vancomycin  1250mg  q24h (eAUC 483, Scr 0.9)  F/u renal function, infectious work up and length of therapy Vancomycin  levels as needed  Height: 5\' 8"  (172.7 cm) Weight: 64 kg (141 lb 1.5 oz) IBW/kg (Calculated) : 68.4  Temp (24hrs), Avg:97.8 F (36.6 C), Min:97.8 F (36.6 C), Max:97.8 F (36.6 C)  Recent Labs  Lab 05/12/23 0327 05/12/23 0334  WBC 9.1  --   CREATININE 0.90  --   LATICACIDVEN  --  1.4    Estimated Creatinine Clearance: 58.3 mL/min (by C-G formula based on SCr of 0.9 mg/dL).    No Known Allergies  Antimicrobials this admission: Ceftriaxone  5/1 Cefepime  5/1 > Vancomycin  5/1 >  Microbiology results: 5/1 bcx:  Thank you for allowing pharmacy to be a part of this patient's care.  Fonda Hymen 05/12/2023 5:37 AM

## 2023-05-12 NOTE — Consult Note (Signed)
 [facility]    Date of Admission:  05/12/2023     Total days of antibiotics 2               Reason for Consult: Pneumonia and Osteomyelitis   Referring Provider: Dr. Marene Shape  Primary Care Provider: Austine Lefort, MD   ASSESSMENT:  Donald Rios is an 83 year old Caucasian male with recent diagnosis of COVID-19 and left fifth toe amputation in March 2025 secondary to peripheral artery disease presenting with altered mental status and bradycardia and found to have hypoxia and findings concerning for postviral pneumonia in the setting of recent COVID-19 infection and acute osteomyelitis of the left fifth metatarsal head.  Recommend continuing with broad-spectrum coverage with vancomycin  and cefepime .  Sputum culture and blood cultures pending and can narrow antibiotics as able.  No surgical intervention pending secondary to respiratory failure and does not appear to the primary problem at present. Therapeutic drug monitoring of renal function and vancomycin  levels. Add droplet precautions.  Suspect COVID recovery slow given immune suppression. Remaining medical and supportive care per PCCM.   PLAN:  Continue broad-spectrum antibiotics with vancomycin  and cefepime . Monitor sputum and blood cultures and adjust antibiotics as appropriate. Therapeutic drug monitoring of renal function and vancomycin  levels. Droplet precautions. Remaining medical and supportive care per PCCM.   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   Pyogenic inflammation of bone (HCC)    amiodarone   200 mg Oral q morning   atorvastatin   40 mg Oral Daily   Chlorhexidine  Gluconate Cloth  6 each Topical Daily   hydrocortisone  sod succinate (SOLU-CORTEF ) inj  100 mg Intravenous Q12H   insulin  aspart  0-6 Units Subcutaneous Q4H   ipratropium-albuterol   3 mL Nebulization Q6H   leptospermum manuka honey  1 Application Topical Daily   midodrine   15 mg Oral Q8H   pantoprazole   40 mg Oral Daily   potassium chloride   20 mEq Oral TID   sodium chloride  flush  10-40 mL Intracatheter Q12H   sodium chloride  flush  3 mL Intravenous Q12H     HPI: Donald Rios is a 83 y.o. male with previous medical history of COPD, type 2 diabetes complicated with neuropathy, hearing loss, hypertension, ischemic cardiomyopathy, and peripheral artery disease with recent left fifth toe amputation presenting to the hospital with altered mental status and bradycardia.  Donald Rios was brought to the hospital via EMS when he was found being lethargic and hard to wake up.  Initially believed to be hypoglycemia with treatment not improving his mental status.  ECG was concerning for STEMI although deemed unlikely..  Also noted to be hypoxic with saturations dropping down into the 80s off of supplemental oxygen.  Afebrile on arrival with no leukocytosis.  Chest x-ray with near complete opacification of left hemothorax favoring a moderate to large left pleural effusion with underlying atelectasis.  CT chest with  dense consolidative airspace disease in the left upper lobe with left lower lobe collapse/consolidation with imaging compatible with multifocal pneumonia.  Left foot x-rays with destructive changes involving the left fifth metatarsal head suggesting acute osteomyelitis with adjacent soft tissue swelling/ulcer.  Previously tested positive for  COVID on 04/12/2023.  Vascular surgery consulted with new ischemic ulcerations about bilateral feet.  In the setting of current respiratory failure not a candidate for intervention presently.  Donald Rios remains afebrile with no leukocytosis.  2 different blood cultures from 05/12/2023 are pending.  Sputum culture collected and pending.  MRSA PCR negative.  Review of Systems: Review of Systems  Constitutional:  Negative for chills, fever and weight loss.  Respiratory:  Positive for cough, sputum production and shortness of breath. Negative for wheezing.   Cardiovascular:  Negative for chest pain and leg swelling.  Gastrointestinal:  Negative for abdominal pain, constipation, diarrhea, nausea and vomiting.  Skin:  Negative for rash.     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    History of MI (myocardial infarction)    History of nuclear stress test 08/07/2010   dipyridamole; EKG negative for ischemia, low risk scan    Hyperlipidemia    Hyperplastic colon polyp 05/2008   Hypertension    Ischemic cardiomyopathy    EF 45%, with inferior wall motion abnormality    Myocardial infarction (HCC)    Valvular regurgitation    mitral and tricuspid (mild)    Social History   Tobacco Use   Smoking status: Former    Current packs/day: 0.00    Types: Cigarettes    Quit date: 11/04/1985    Years since quitting: 37.5   Smokeless tobacco: Never  Vaping Use   Vaping status: Never Used  Substance Use Topics   Alcohol use: Not Currently   Drug use: No     Family History  Problem Relation Age of Onset   Stroke Father    Hypertension Father    Diabetes Brother    Esophageal cancer Brother    Stroke Maternal Grandmother    Stroke Maternal Grandfather    Stomach cancer Maternal Aunt    Colon cancer Neg Hx    Rectal cancer Neg Hx     No Known Allergies  OBJECTIVE: Blood pressure (!) 112/45, pulse 66, temperature 98 F (36.7 C), temperature source Oral, resp. rate 20, height 5\' 8"  (1.727 m), weight 58 kg, SpO2 96%.  Physical Exam Constitutional:      General: He is not in acute distress.    Appearance: He is well-developed.  Cardiovascular:     Rate and Rhythm: Normal rate and regular rhythm.     Heart sounds: Normal heart sounds.     Comments: Left lower extremity appears dusky compared to right side. Palpable dorsalis pedis pulse. 2+ pitting edema on the left. Multiple ulcers on left foot. No clear drainage or odors. Pulmonary:     Effort: Pulmonary effort is normal.  Breath sounds: Rhonchi present.  Skin:    General: Skin is warm and dry.  Neurological:     Mental Status: He is alert and oriented to person, place, and time.  Psychiatric:        Mood and Affect: Mood normal.     Lab Results Lab Results  Component Value Date   WBC 9.1 05/12/2023   HGB 8.2 (L) 05/12/2023   HCT 24.0 (L) 05/12/2023   MCV 98.1 05/12/2023   PLT 214 05/12/2023    Lab Results  Component Value Date   CREATININE 0.90 05/12/2023   BUN 8 05/12/2023   NA 135 05/12/2023   K 2.7 (LL) 05/12/2023   CL 101 05/12/2023   CO2 30 05/12/2023    Lab Results  Component Value Date   ALT 13 05/12/2023   AST 24 05/12/2023   ALKPHOS 81 05/12/2023   BILITOT 1.1 05/12/2023     Microbiology: Recent Results (from the past 240 hours)  Culture, blood (routine x 2)     Status: None (Preliminary result)   Collection Time: 05/12/23  3:28 AM   Specimen: BLOOD RIGHT HAND  Result Value Ref Range Status   Specimen Description BLOOD RIGHT HAND  Final    Special Requests   Final    BOTTLES DRAWN AEROBIC AND ANAEROBIC Blood Culture adequate volume Performed at [facility], [address], [place], [place] [address]    Culture PENDING  Incomplete   Report Status PENDING  Incomplete  MRSA Next Gen by PCR, Nasal     Status: None   Collection Time: 05/12/23 10:52 AM   Specimen: Nasal Mucosa; Nasal Swab  Result Value Ref Range Status   MRSA by PCR Next Gen NOT DETECTED NOT DETECTED Final    Comment: (NOTE) The GeneXpert MRSA Assay (FDA approved for NASAL specimens only), is one component of a comprehensive MRSA colonization surveillance program. It is not intended to diagnose MRSA infection nor to guide or monitor treatment for MRSA infections. Test performance is not FDA approved in patients less than 44 years old. Performed at [facility], [address] [place], [place] [address]     I have personally spent 30 minutes involved in face-to-face and non-face-to-face activities for this patient on the day of the visit. Professional time spent includes the following activities: Preparing to see the patient (review of tests), Obtaining and/or reviewing separately obtained history (admission/discharge record), Performing a medically appropriate examination and/or evaluation , Ordering medications/tests/procedures, referring and communicating with other health care professionals, Documenting clinical information in the EMR, Independently interpreting results (not separately reported), Communicating results to the patient/family/caregiver, Counseling and educating the patient/family/caregiver and Care coordination (not separately reported).   Greg Talayah Picardi, NP [facility]  05/12/2023  1:55 PM

## 2023-05-12 NOTE — Progress Notes (Signed)
 PHARMACY - ANTICOAGULATION CONSULT NOTE  Pharmacy Consult for heparin  Indication: atrial fibrillation  No Known Allergies  Patient Measurements: Height: 5\' 8"  (172.7 cm) Weight: 58 kg (127 lb 13.9 oz) IBW/kg (Calculated) : 68.4 HEPARIN  DW (KG): 64  Vital Signs: Temp: 98 F (36.7 C) (05/01 1157) Temp Source: Oral (05/01 1157) BP: 113/39 (05/01 1400) Pulse Rate: 64 (05/01 1400)  Labs: Recent Labs    05/12/23 0327 05/12/23 0530 05/12/23 0756  HGB 7.7*  --  8.2*  HCT 25.5*  --  24.0*  PLT 214  --   --   CREATININE 0.90  --   --   TROPONINIHS 81* 39*  --     Estimated Creatinine Clearance: 52.8 mL/min (by C-G formula based on SCr of 0.9 mg/dL).   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    History of MI (myocardial infarction)    History of nuclear stress test 08/07/2010   dipyridamole; EKG negative for ischemia, low risk scan    Hyperlipidemia    Hyperplastic colon polyp 05/2008   Hypertension    Ischemic cardiomyopathy    EF 45%, with inferior wall motion abnormality    Myocardial infarction (HCC)    Valvular regurgitation    mitral and tricuspid (mild)    Medications:  Scheduled:   amiodarone   200 mg Oral q morning   atorvastatin   40 mg Oral Daily   Chlorhexidine  Gluconate Cloth  6 each Topical Daily   insulin  aspart  0-6 Units Subcutaneous Q4H   ipratropium-albuterol   3 mL Nebulization Q6H   leptospermum manuka honey  1 Application Topical Daily   midodrine   15 mg Oral Q8H   pantoprazole   40 mg Oral  Daily   potassium chloride   20 mEq Oral TID   sodium chloride  flush  10-40 mL Intracatheter Q12H   sodium chloride  flush  3 mL Intravenous Q12H    Assessment: 40 YOM who presented with with lethargy and hypoglycemia. He became hypoxic and CXR was positive for L hemithroax opacification. Concern for possible pneumonia and osteomyelitis of food was also raised. He has a history of atrial fibrillation for which he takes Eliquis . Last dose was on 4/30 at 2100. Due to potential for procedures, pharmacy has been consulted to dose heparin .   Hgb 7.7, plt 214 at baseline. Will forego bolus due to recent Eliquis  use. Will monitor with aPTT levels until they correlate with heparin  levels.   Goal of Therapy:  Heparin  level 0.3-0.7 units/ml aPTT 66-102 seconds Monitor platelets by anticoagulation protocol: Yes   Plan:  Initiate heparin  infusion at 900 units/hr Obtain aPTT and heparin  level in 8 hours Obtain aPTT, heparin  level and CBC daily while on heparin  infusion Monitor for s/sx of bleeding Follow-up transition back to PO anticoagulation  Adelyn Roscher, PharmD PGY1 Pharmacy Resident 05/12/2023 2:51 PM

## 2023-05-12 NOTE — ED Provider Notes (Signed)
  EMERGENCY DEPARTMENT AT [facility] Provider Note   CSN: [id number] Arrival date & time: 05/12/23  4098     History  Chief Complaint  Patient presents with   Altered Mental Status    Donald Rios is a 83 y.o. male.  The history is provided by the patient and the EMS personnel. The history is limited by the condition of the patient (Altered mental status).  Altered Mental Status He has history of hypertension, diabetes, hyperlipidemia, coronary artery disease, COPD, recent hospitalization for cellulitis of left foot and amputation of left fifth toe and came in by ambulance as a code STEMI.  EMS was called out for altered mental status and noted bradycardia, glucose of 60.  He was given intravenous dextrose  and glucose went up to 208 without any improvement in his mental status.  ECG was concerning for STEMI.  He did have episodes of bradycardia down to the mid 30s.  He also was noted to have low blood pressure into the 80s but improved with IV fluids.  I was able to get him to answer questions he actually is denying chest pain or dyspnea or nausea.  He does endorse feeling generally weak.   Home Medications Prior to Admission medications   Medication Sig Start Date End Date Taking? Authorizing Provider  acetaminophen  (TYLENOL ) 325 MG tablet Take 2 tablets (650 mg total) by mouth every 6 (six) hours as needed for mild pain (pain score 1-3) (or Fever >/= 101). 04/19/23   Hongalgi, Anand D, MD  allopurinol  (ZYLOPRIM ) 300 MG tablet TAKE 1 TABLET BY MOUTH DAILY 04/26/23   Austine Lefort, MD  amiodarone  (PACERONE ) 200 MG tablet Take 1 tablet (200 mg total) by mouth every morning. 04/19/23   Hongalgi, Anand D, MD  ascorbic acid  (VITAMIN C ) 250 MG tablet Take 1 tablet (250 mg total) by mouth 2 (two) times daily. 04/19/23   Hongalgi, Anand D, MD  atorvastatin  (LIPITOR) 40 MG tablet Take 1 tablet (40 mg total) by mouth daily. 04/20/23   Hongalgi, Anand D, MD  bisacodyl  (DULCOLAX) 10  MG suppository Place 1 suppository (10 mg total) rectally daily as needed for moderate constipation. 04/19/23   Hongalgi, Anand D, MD  cholecalciferol  (CHOLECALCIFEROL ) 25 MCG tablet Take 1 tablet (1,000 Units total) by mouth daily. 04/20/23   Hongalgi, Anand D, MD  cyanocobalamin  (VITAMIN B12) 500 MCG tablet Take 1 tablet (500 mcg total) by mouth daily. 04/20/23   Hongalgi, Anand D, MD  digoxin  (LANOXIN ) 0.125 MG tablet Take 1 tablet (0.125 mg total) by mouth daily. 04/20/23   Hongalgi, Anand D, MD  ELIQUIS  5 MG TABS tablet TAKE 1 TABLET BY MOUTH 2 TIMES A DAY 03/23/23   Austine Lefort, MD  empagliflozin  (JARDIANCE ) 10 MG TABS tablet Take 1 tablet (10 mg total) by mouth daily. Patient taking differently: Take 10 mg by mouth in the morning. 08/16/22   Sanjuanita Cruz, NP  feeding supplement (ENSURE ENLIVE / ENSURE PLUS) LIQD Take 237 mLs by mouth 3 (three) times daily between meals. 04/19/23   Hongalgi, Anand D, MD  fludrocortisone  (FLORINEF ) 0.1 MG tablet Take 2 tablets (0.2 mg total) by mouth 2 (two) times daily. 04/19/23   Hongalgi, Anand D, MD  insulin  aspart (NOVOLOG ) 100 UNIT/ML injection Inject 0-9 Units into the skin 3 (three) times daily with meals. 0-9 Units, Subcutaneous, 3 times daily with meals, First dose on Wed 04/13/23 at 0800 Correction coverage: Sensitive (thin, NPO, renal) CBG < 70:  Implement Hypoglycemia protocol, CBG 70 - 120: 0 units CBG 121 - 150: 1 unit CBG 151 - 200: 2 units CBG 201 - 250: 3 units CBG 251 - 300: 5 units CBG 301 - 350: 7 units CBG 351 - 400: 9 units CBG > 400: call MD. 04/19/23   Casey Clay, MD  metoprolol  tartrate (LOPRESSOR ) 50 MG tablet Take 1.5 tablets (75 mg total) by mouth 2 (two) times daily. 04/19/23   Hongalgi, Anand D, MD  midodrine  (PROAMATINE ) 5 MG tablet Take 3 tablets (15 mg total) by mouth 3 (three) times daily with meals. 04/19/23   Hongalgi, Anand D, MD  mirtazapine  (REMERON ) 30 MG tablet TAKE 1 TABLET BY MOUTH EVERY NIGHT AT BEDTIME FOR APPETITE Patient  taking differently: Take 30 mg by mouth at bedtime. TAKE 1 TABLET BY MOUTH EVERY NIGHT AT BEDTIME FOR APPETITE 03/07/23   Austine Lefort, MD  Multiple Vitamin (MULTIVITAMIN WITH MINERALS) TABS tablet Take 1 tablet by mouth daily. 04/20/23   Hongalgi, Anand D, MD  pantoprazole  (PROTONIX ) 40 MG tablet TAKE 1 TABLET BY MOUTH DAILY Patient taking differently: Take 40 mg by mouth in the morning. 03/04/23   Austine Lefort, MD  polyethylene glycol (MIRALAX  / GLYCOLAX ) 17 g packet Take 17 g by mouth daily. 04/19/23   Hongalgi, Anand D, MD  senna-docusate (SENOKOT-S) 8.6-50 MG tablet Take 1 tablet by mouth at bedtime. 04/19/23   Hongalgi, Anand D, MD  thiamine  (VITAMIN B-1) 100 MG tablet Take 1 tablet (100 mg total) by mouth daily. 04/20/23   Hongalgi, Anand D, MD  Tocilizumab  (ACTEMRA  IV) Inject 6 mg/kg into the vein every 28 (twenty-eight) days.    [provider]  vitamin A  3 MG (10000 UNITS) capsule Take 1 capsule (10,000 Units total) by mouth daily. 04/20/23   Hongalgi, Anand D, MD  zinc  sulfate, 50mg  elemental zinc , 220 (50 Zn) MG capsule Take 1 capsule (220 mg total) by mouth daily. 04/20/23   Hongalgi, Anand D, MD      Allergies    Patient has no known allergies.    Review of Systems   Review of Systems  Unable to perform ROS: Mental status change    Physical Exam Updated Vital Signs BP (!) 98/44   Pulse 61   Resp (!) 21   SpO2 97%  Physical Exam Vitals and nursing note reviewed.   83 year old male, resting comfortably and in no acute distress. Vital signs are significant for low blood pressure and borderline elevated respirations. Oxygen saturation is 97%, which is normal but only obtained while on supplemental oxygen.  When taken off of oxygen, he becomes hypoxic with oxygen saturation dropping down into the 80s. Head is normocephalic and atraumatic. PERRLA.  Lungs are clear without rales, wheezes, or rhonchi. Chest is nontender. Heart has regular rate and rhythm without  murmur. Abdomen is soft, flat. Extremities: Amputation of the left fifth toe with sutures still in place and wound appearing clean.  Left foot appears slightly dusky and is warm to the touch.  There is an ulceration over the medial aspect of the left first MTP joint. Skin is warm and dry without rash. Neurologic: Somnolent but arousable and oriented to person.  Cranial nerves are grossly intact.  He does move all extremities.      ED Results / Procedures / Treatments   Labs (all labs ordered are listed, but only abnormal results are displayed) Labs Reviewed  CBG MONITORING, ED - Abnormal; Notable for  the following components:      Result Value   Glucose-Capillary 108 (*)    All other components within normal limits  CULTURE, BLOOD (ROUTINE X 2)  CULTURE, BLOOD (ROUTINE X 2)  COMPREHENSIVE METABOLIC PANEL WITH GFR  CBC WITH DIFFERENTIAL/PLATELET  AMMONIA  ETHANOL  URINALYSIS, W/ REFLEX TO CULTURE (INFECTION SUSPECTED)  RAPID URINE DRUG SCREEN, HOSP PERFORMED  SEDIMENTATION RATE  C-REACTIVE PROTEIN  I-STAT CG4 LACTIC ACID, ED  TROPONIN I (HIGH SENSITIVITY)    EKG None  Radiology No results found.  Procedures Procedures  Cardiac monitor shows sinus rhythm with episodes of sinus bradycardia, per my interpretation.  Medications Ordered in ED Medications  dextrose  10 % infusion ( Intravenous New Bag/Given 05/12/23 0405)  potassium chloride  10 mEq in 100 mL IVPB (10 mEq Intravenous New Bag/Given 05/12/23 0505)  cefTRIAXone  (ROCEPHIN ) 2 g in sodium chloride  0.9 % 100 mL IVPB (2 g Intravenous New Bag/Given 05/12/23 0516)  vancomycin  (VANCOCIN ) IVPB 1000 mg/200 mL premix (has no administration in time range)  magnesium  sulfate IVPB 2 g 50 mL (has no administration in time range)  allopurinol  (ZYLOPRIM ) tablet 300 mg (has no administration in time range)  amiodarone  (PACERONE ) tablet 200 mg (has no administration in time range)  atorvastatin  (LIPITOR) tablet 40 mg (has no  administration in time range)  digoxin  (LANOXIN ) tablet 0.125 mg (has no administration in time range)  midodrine  (PROAMATINE ) tablet 15 mg (has no administration in time range)  pantoprazole  (PROTONIX ) EC tablet 40 mg (has no administration in time range)  hydrocortisone  sodium succinate  (SOLU-CORTEF ) 100 MG injection 100 mg (has no administration in time range)  hydrocortisone  sodium succinate  (SOLU-CORTEF ) 100 MG injection 50 mg (has no administration in time range)  insulin  aspart (novoLOG ) injection 0-6 Units (has no administration in time range)  sodium chloride  flush (NS) 0.9 % injection 3 mL (has no administration in time range)  potassium chloride  SA (KLOR-CON  M) CR tablet 20 mEq (has no administration in time range)  acetaminophen  (TYLENOL ) tablet 650 mg (has no administration in time range)    Or  acetaminophen  (TYLENOL ) suppository 650 mg (has no administration in time range)  senna-docusate (Senokot-S) tablet 1 tablet (has no administration in time range)  trimethobenzamide  (TIGAN ) injection 200 mg (has no administration in time range)  sodium chloride  0.9 % bolus 1,000 mL (1,000 mLs Intravenous New Bag/Given 05/12/23 0440)  dextrose  50 % solution 50 mL ( Intravenous Not Given 05/12/23 0454)    ED Course/ Medical Decision Making/ A&P                                 Medical Decision Making Amount and/or Complexity of Data Reviewed Labs: ordered. Radiology: ordered.  Risk Prescription drug management. Decision regarding hospitalization.   Altered mental status.  I have reviewed the ECG from EMS and the shows a bundle branch block without any clear ST elevation.  Dr. Arlester Ladd of cardiology was here and canceled code STEMI.  STEMI is very unlikely especially with patient denying chest pain.  I am concerned about altered mentation and possible sepsis.  This is a presentation with wide range of options and has significant risk for morbidity and complications.  I have ordered blood  and urine cultures and laboratory workup.  I have ordered chest x-ray and CT of head.  Glucose in the ED was 108.  Since his weight had dropped from 200, I have started a glucose  infusion.  I note hospitalization on 04/12/2023 for acute encephalopathy and hypoglycemia.  That presentation was actually very similar to today.  I have reviewed his electrocardiogram, and my interpretation is right bundle branch block, left ventricular hypertrophy with secondary repolarization changes.  QRS and repolarization changes are essentially the same as on ECG from 04/12/2023.  I reviewed his laboratory tests, my interpretation is normal lactic acid level, negative drug screen, normal urinalysis, severe hypokalemia, elevated random glucose level severe hypoalbuminemia CBC with normal WBC but with a left shift, significant anemia with hemoglobin dropping 2.7 g compared with 04/25/2023, elevated CRP but only minimally elevated sedimentation rate, elevated troponin which is felt to represent demand ischemia and not STEMI although repeat is pending.  CT head shows no acute intracranial abnormality, chest x-ray shows opacification of left hemithorax concerning for effusion, foot x-ray shows changes of osteomyelitis.  I have independently viewed all of these images, and agree with radiologist's interpretation.  I have ordered CT of chest to further evaluate the left chest opacification.  I have ordered multiple doses of IV potassium.  Magnesium  level is pending but I am empirically ordering a dose of intravenous magnesium .  I have started antibiotics for osteomyelitis-ceftriaxone  and vancomycin .  I discussed case with Dr. Brice Campi of [facility], who agrees to admit the patient.  Glucose level dropped down in spite of dextrose  infusion.  I have ordered an additional dose of intravenous dextrose  and increased the rate of dextrose  infusion.  CRITICAL CARE Performed by: Alissa April Total critical care time: 140 minutes Critical care  time was exclusive of separately billable procedures and treating other patients. Critical care was necessary to treat or prevent imminent or life-threatening deterioration. Critical care was time spent personally by me on the following activities: development of treatment plan with patient and/or surrogate as well as nursing, discussions with consultants, evaluation of patient's response to treatment, examination of patient, obtaining history from patient or surrogate, ordering and performing treatments and interventions, ordering and review of laboratory studies, ordering and review of radiographic studies, pulse oximetry and re-evaluation of patient's condition.  Final Clinical Impression(s) / ED Diagnoses Final diagnoses:  Other acute osteomyelitis of left foot (HCC)  Hypokalemia  Hypoglycemia  Normochromic normocytic anemia  Elevated random blood glucose level  Severe anemia    Rx / DC Orders ED Discharge Orders     None         Alissa April, MD 05/12/23 [phone]

## 2023-05-12 NOTE — Progress Notes (Addendum)
 eLink Physician-Brief Progress Note Patient Name: Donald Rios DOB: Nov 25, 1940 MRN: [id number]   Date of Service  05/12/2023  HPI/Events of Note  levo order states titrate for MAP >65, SBP >90.  SBP meets parameters but DBP is very low, so cannot meet MAP goal  eICU Interventions  Adjust parameters for SBP goal of greater than 100   0549 -notified of electrolyte abnormalities-calcium  6.7 (albumin  1.5, corrected 8.7), sodium 128, and potassium 3.5.  Add KCl replacement.  Intervention Category Intermediate Interventions: Hypotension - evaluation and management  Katrine Radich 05/12/2023, 10:08 PM

## 2023-05-12 NOTE — ED Notes (Signed)
 Attempted to collect blood culture x2. Unsuccessful

## 2023-05-13 ENCOUNTER — Telehealth: Payer: Self-pay | Admitting: *Deleted

## 2023-05-13 ENCOUNTER — Inpatient Hospital Stay (HOSPITAL_COMMUNITY)

## 2023-05-13 DIAGNOSIS — J189 Pneumonia, unspecified organism: Secondary | ICD-10-CM

## 2023-05-13 DIAGNOSIS — L98499 Non-pressure chronic ulcer of skin of other sites with unspecified severity: Secondary | ICD-10-CM

## 2023-05-13 DIAGNOSIS — J90 Pleural effusion, not elsewhere classified: Secondary | ICD-10-CM | POA: Diagnosis not present

## 2023-05-13 DIAGNOSIS — J9600 Acute respiratory failure, unspecified whether with hypoxia or hypercapnia: Secondary | ICD-10-CM | POA: Diagnosis not present

## 2023-05-13 DIAGNOSIS — E876 Hypokalemia: Secondary | ICD-10-CM | POA: Diagnosis not present

## 2023-05-13 DIAGNOSIS — M86171 Other acute osteomyelitis, right ankle and foot: Secondary | ICD-10-CM | POA: Diagnosis not present

## 2023-05-13 DIAGNOSIS — I4891 Unspecified atrial fibrillation: Secondary | ICD-10-CM | POA: Diagnosis not present

## 2023-05-13 DIAGNOSIS — A419 Sepsis, unspecified organism: Secondary | ICD-10-CM | POA: Diagnosis not present

## 2023-05-13 LAB — BASIC METABOLIC PANEL WITH GFR
Anion gap: 10 (ref 5–15)
BUN: 11 mg/dL (ref 8–23)
CO2: 20 mmol/L — ABNORMAL LOW (ref 22–32)
Calcium: 6.7 mg/dL — ABNORMAL LOW (ref 8.9–10.3)
Chloride: 98 mmol/L (ref 98–111)
Creatinine, Ser: 1.02 mg/dL (ref 0.61–1.24)
GFR, Estimated: >60 mL/min (ref >60)
Glucose, Bld: 420 mg/dL — ABNORMAL HIGH (ref 70–99)
Potassium: 3.5 mmol/L (ref 3.5–5.1)
Sodium: 128 mmol/L — ABNORMAL LOW (ref 135–145)

## 2023-05-13 LAB — CBC
HCT: 22.6 % — ABNORMAL LOW (ref 39.0–52.0)
Hemoglobin: 7 g/dL — ABNORMAL LOW (ref 13.0–17.0)
MCH: 29.9 pg (ref 26.0–34.0)
MCHC: 31 g/dL (ref 30.0–36.0)
MCV: 96.6 fL (ref 80.0–100.0)
Platelets: 236 10*3/uL (ref 150–400)
RBC: 2.34 MIL/uL — ABNORMAL LOW (ref 4.22–5.81)
RDW: 20.7 % — ABNORMAL HIGH (ref 11.5–15.5)
WBC: 36.2 10*3/uL — ABNORMAL HIGH (ref 4.0–10.5)
nRBC: 0.3 % — ABNORMAL HIGH (ref 0.0–0.2)

## 2023-05-13 LAB — HEPARIN LEVEL (UNFRACTIONATED)
Heparin Unfractionated: >1.1 [IU]/mL — ABNORMAL HIGH (ref 0.30–0.70)
Heparin Unfractionated: >1.1 [IU]/mL — ABNORMAL HIGH (ref 0.30–0.70)

## 2023-05-13 LAB — APTT
aPTT: >200 s (ref 24–36)
aPTT: 180 s (ref 24–36)

## 2023-05-13 LAB — HEMOGLOBIN A1C
Hgb A1c MFr Bld: 5 % (ref 4.8–5.6)
Mean Plasma Glucose: 96.8 mg/dL

## 2023-05-13 LAB — GLUCOSE, CAPILLARY
Glucose-Capillary: 177 mg/dL — ABNORMAL HIGH (ref 70–99)
Glucose-Capillary: 236 mg/dL — ABNORMAL HIGH (ref 70–99)
Glucose-Capillary: 241 mg/dL — ABNORMAL HIGH (ref 70–99)
Glucose-Capillary: 221 mg/dL — ABNORMAL HIGH (ref 70–99)
Glucose-Capillary: 262 mg/dL — ABNORMAL HIGH (ref 70–99)
Glucose-Capillary: 209 mg/dL — ABNORMAL HIGH (ref 70–99)

## 2023-05-13 LAB — VAS US ABI WITH/WO TBI

## 2023-05-13 LAB — MAGNESIUM: Magnesium: 2.3 mg/dL (ref 1.7–2.4)

## 2023-05-13 LAB — PHOSPHORUS: Phosphorus: 2.8 mg/dL (ref 2.5–4.6)

## 2023-05-13 MED ORDER — SENNOSIDES-DOCUSATE SODIUM 8.6-50 MG PO TABS
1.0000 | ORAL_TABLET | Freq: Every day | ORAL | Status: DC
  Administered 2023-05-13: 1 via ORAL
  Filled 2023-05-13: qty 1

## 2023-05-13 MED ORDER — JUVEN PO PACK
1.0000 | PACK | Freq: Two times a day (BID) | ORAL | Status: DC
  Administered 2023-05-13 – 2023-05-16 (×2): 1 via ORAL
  Filled 2023-05-13 (×2): qty 1

## 2023-05-13 MED ORDER — POLYETHYLENE GLYCOL 3350 17 G PO PACK
17.0000 g | PACK | Freq: Every day | ORAL | Status: DC
  Administered 2023-05-16: 17 g via ORAL
  Filled 2023-05-13: qty 1

## 2023-05-13 MED ORDER — HEPARIN (PORCINE) 25000 UT/250ML-% IV SOLN
850.0000 [IU]/h | INTRAVENOUS | Status: DC
  Administered 2023-05-13: 650 [IU]/h via INTRAVENOUS
  Filled 2023-05-13: qty 250

## 2023-05-13 MED ORDER — ALBUMIN HUMAN 25 % IV SOLN
25.0000 g | Freq: Four times a day (QID) | INTRAVENOUS | Status: AC
  Administered 2023-05-13: 25 g via INTRAVENOUS
  Administered 2023-05-13: 12.5 g via INTRAVENOUS
  Administered 2023-05-14: 25 g via INTRAVENOUS
  Administered 2023-05-14: 12.5 g via INTRAVENOUS
  Filled 2023-05-13 (×4): qty 100

## 2023-05-13 MED ORDER — FAMOTIDINE 20 MG PO TABS
20.0000 mg | ORAL_TABLET | Freq: Every day | ORAL | Status: DC
  Administered 2023-05-13 – 2023-05-16 (×3): 20 mg via ORAL
  Filled 2023-05-13 (×3): qty 1

## 2023-05-13 MED ORDER — ZINC SULFATE 220 (50 ZN) MG PO CAPS
220.0000 mg | ORAL_CAPSULE | Freq: Every day | ORAL | Status: DC
  Administered 2023-05-13 – 2023-05-16 (×3): 220 mg via ORAL
  Filled 2023-05-13 (×3): qty 1

## 2023-05-13 MED ORDER — PREDNISONE 20 MG PO TABS
40.0000 mg | ORAL_TABLET | Freq: Every day | ORAL | Status: DC
  Administered 2023-05-15: 40 mg via ORAL
  Filled 2023-05-13: qty 2

## 2023-05-13 MED ORDER — GUAIFENESIN 100 MG/5ML PO LIQD
5.0000 mL | ORAL | Status: DC
  Administered 2023-05-13 – 2023-05-14 (×4): 5 mL via ORAL
  Filled 2023-05-13 (×4): qty 5

## 2023-05-13 MED ORDER — POTASSIUM CHLORIDE 10 MEQ/100ML IV SOLN
10.0000 meq | INTRAVENOUS | Status: DC
  Administered 2023-05-13: 10 meq via INTRAVENOUS
  Filled 2023-05-13: qty 100

## 2023-05-13 MED ORDER — HEPARIN (PORCINE) 25000 UT/250ML-% IV SOLN
650.0000 [IU]/h | INTRAVENOUS | Status: DC
  Administered 2023-05-13: 650 [IU]/h via INTRAVENOUS

## 2023-05-13 MED ORDER — MIRTAZAPINE 15 MG PO TABS
30.0000 mg | ORAL_TABLET | Freq: Every day | ORAL | Status: DC
  Administered 2023-05-13 – 2023-05-15 (×3): 30 mg via ORAL
  Filled 2023-05-13 (×3): qty 2

## 2023-05-13 MED ORDER — VITAMIN B-6 25 MG PO TABS
25.0000 mg | ORAL_TABLET | Freq: Every day | ORAL | Status: DC
  Administered 2023-05-13 – 2023-05-16 (×3): 25 mg via ORAL
  Filled 2023-05-13 (×4): qty 1

## 2023-05-13 MED ORDER — ADULT MULTIVITAMIN W/MINERALS CH
1.0000 | ORAL_TABLET | Freq: Every day | ORAL | Status: DC
  Filled 2023-05-13: qty 1

## 2023-05-13 MED ORDER — VITAMIN C 500 MG PO TABS
250.0000 mg | ORAL_TABLET | Freq: Two times a day (BID) | ORAL | Status: DC
  Administered 2023-05-13 – 2023-05-16 (×5): 250 mg via ORAL
  Filled 2023-05-13 (×5): qty 1

## 2023-05-13 MED ORDER — POTASSIUM CHLORIDE 20 MEQ PO PACK
40.0000 meq | PACK | Freq: Once | ORAL | Status: AC
  Administered 2023-05-13: 40 meq via ORAL
  Filled 2023-05-13: qty 2

## 2023-05-13 NOTE — Progress Notes (Signed)
 PHARMACY - ANTICOAGULATION CONSULT NOTE  Pharmacy Consult for heparin  Indication: atrial fibrillation  Labs: Recent Labs    05/12/23 0327 05/12/23 0530 05/12/23 0756 05/12/23 1355 05/12/23 2334 05/13/23 0450  HGB 7.7*  --  8.2*  --   --  7.0*  HCT 25.5*  --  24.0*  --   --  22.6*  PLT 214  --   --   --   --  236  APTT  --   --   --   --  >200* 180*  HEPARINUNFRC  --   --   --   --  >1.10*  --   CREATININE 0.90  --   --  1.10  --  1.02  TROPONINIHS 81* 39*  --   --   --   --    Assessment: 82yo male supratherapeutic on heparin  with initial dosing while apixaban  on hold; no infusion issues per RN but he does note that multiple wounds have been oozing; Hgb is down.  Heparin  has been moved to PIV with most recent results being drawn from PICC (initial labs drawn from PICC while UFH was running through PICC).  Goal of Therapy:  aPTT 66-102 seconds   Plan:  Hold heparin  infusion x1h. Decrease heparin  infusion by 4 units/kg/hr to 650 units/hr. Check PTT in 8 hours.   Lonnie Roberts, PharmD, BCPS 05/13/2023 6:36 AM

## 2023-05-13 NOTE — Progress Notes (Signed)
 [facility]    Date of Admission:  05/12/2023   Total days of antibiotics 3   ID: Donald Rios is a 83 y.o. 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   Pyogenic inflammation of bone (HCC)    Subjective: He reports feeling better still productive cough but easier work of breathing, did receive steroids yesterday (explains leukocytosis of 36K on labs)  Undergoing ABI on right leg to see if sufficient blood flow to ischemic wounds to right foot  Medications:   amiodarone   200 mg Oral q morning   ascorbic acid   250 mg Oral BID   atorvastatin   40 mg Oral Daily   Chlorhexidine  Gluconate Cloth  6 each Topical Daily   famotidine   20 mg Oral Daily   guaiFENesin   5 mL Oral Q4H   insulin  aspart  0-6 Units Subcutaneous Q4H   ipratropium-albuterol   3 mL Nebulization Q6H   leptospermum manuka honey  1 Application Topical Daily   midodrine   15 mg Oral Q8H   mirtazapine   30 mg Oral QHS   multivitamin with minerals  1 tablet Per Tube Daily   nutrition supplement (JUVEN)  1 packet Oral BID BM   mouth rinse  15 mL Mouth Rinse 4 times per day   polyethylene glycol  17 g Oral Daily   [START ON 05/14/2023] predniSONE   40 mg Oral Q breakfast   pyridOXINE   25 mg Oral Daily   senna-docusate  1 tablet Oral QHS   sodium chloride  flush  10-40 mL Intracatheter Q12H   sodium chloride  flush  3 mL  Intravenous Q12H   zinc  sulfate (50mg  elemental zinc )  220 mg Oral Daily    Objective: Vital signs in last 24 hours: Temp:  [97.7 F (36.5 C)-99.1 F (37.3 C)] 98.4 F (36.9 C) (05/02 1525) Pulse Rate:  [63-95] 95 (05/02 1200) Resp:  [17-31] 27 (05/02 1200) BP: (78-136)/(26-54) 119/54 (05/02 1200) SpO2:  [91 %-100 %] 94 % (05/02 1200) Weight:  [58.1 kg] 58.1 kg (05/02 0511) Physical Exam  Constitutional: He is oriented to person, place, and time. He appears frail, and stated age and well-nourished. No distress.  HENT:  Mouth/Throat: Oropharynx is clear and moist. No oropharyngeal exudate.  Cardiovascular: Normal rate, regular rhythm and normal heart sounds. Exam reveals no gallop and no friction rub.  No murmur heard.  Pulmonary/Chest: Effort normal and breath sounds normal. No respiratory distress. He has no wheezes.  Abdominal: Soft. Bowel sounds are normal. He exhibits no distension. There is no tenderness.  Lymphadenopathy:  He has no cervical adenopathy.  Neurological: He is alert and oriented to person, place, and time.  Skin: ischemic wounds/eschar to right foot, healing 5th toe amputation Psychiatric: He has a normal mood and affect. His behavior is normal.    Lab Results Recent Labs    05/12/23 0327 05/12/23 0756 05/12/23 1355  05/13/23 0450  WBC 9.1  --   --  36.2*  HGB 7.7* 8.2*  --  7.0*  HCT 25.5* 24.0*  --  22.6*  NA 139 135 136 128*  K 2.0* 2.7* 3.2* 3.5  CL 101  --  102 98  CO2 30  --  23 20*  BUN 8  --  9 11  CREATININE 0.90  --  1.10 1.02   Liver Panel Recent Labs    05/12/23 0327 05/12/23 1355  PROT 4.0* 4.0*  ALBUMIN  1.7* 1.6*  AST 24 32  ALT 13 13  ALKPHOS 81 75  BILITOT 1.1 0.9  BILIDIR  --  0.2  IBILI  --  0.7   Sedimentation Rate Recent Labs    05/12/23 0327  ESRSEDRATE 26*   C-Reactive Protein Recent Labs    05/12/23 0327  CRP 6.5*    Microbiology: 5/1 blood cx ngtd 5/1 sputum cx pending Studies/Results: DG  Swallowing Func-Speech Pathology Result Date: 05/13/2023 Table formatting from the original result was not included. Modified Barium Swallow Study Patient Details Name: Donald Rios MRN: [id number] Date of Birth: 01-29-1940 Today's Date: 05/13/2023 HPI/PMH: HPI: Donald Rios is an 83 yo male presenting to ED 5/1 with AMS and hypoglycemia. Recently admitted 4/1 to [facility] for sepsis secondary to LLE cellulitis complicated by hypotension. CT Chest concerning for L hemithorax opacification and patchy nodular airspace opacity in the posterior RUL and scattered throughout the RLL with dense consolidative airspace disease through the RLL, concerning for multifocal PNA. Seen by SLP 08/14/22 with WFL oropharyngeal swallow. PMH includes CAD s/p CABG, COPD, DM, ischemic cardiomyopathy with chronic HFrEF, A-fib on Eliquis , GERD Clinical Impression: Pt presents with mild oropharyngeal dysphagia which is suspected to be impacted by coordination of breathing and swallowing secondary to acute change in respiratory status. Pt is currently on 8 LPM HFNC and was quite anxious re: his breathing which resulted in increased WOB and tachypnea. Pt's RR and SpO2 overall remained stable when challeneged with presentations of sequential sips. He initiates a swallow at the level of the valleculae but due to incomplete laryngeal elevation, penetration occurs above the level of the vocal folds with thin and nectar thick liquids (PAS 3). Penetrates were expelled when pt was cued to clear his throat. BOT retraction is incomplete resulting in trace pharyngeal residue. Mastication was mildly prolonged with regular solids as pt's dentures are unavailable. Recommend initiating a diet of Dys 3 solids with thin liquids using intermittent throat clearance. If pt's dentures become available over the weekend, pt's diet can be upgraded to regular per RN/MD/RD discretion. SLP will f/u at least briefly to provide education. Factors that may increase risk of  adverse event in presence of aspiration Donald Rios & Donald Rios 2021): Factors that may increase risk of adverse event in presence of aspiration Donald Rios & Donald Rios 2021): Poor general health and/or compromised immunity; Respiratory or GI disease; Limited mobility; Frail or deconditioned; Inadequate oral hygiene Recommendations/Plan: Swallowing Evaluation Recommendations Swallowing Evaluation Recommendations Recommendations: PO diet PO Diet Recommendation: Dysphagia 3 (Mechanical soft); Thin liquids (Level 0) Liquid Administration via: Cup; Straw Medication Administration: Whole meds with puree Supervision: Staff to assist with self-feeding; Full supervision/cueing for swallowing strategies Swallowing strategies  : Minimize environmental distractions; Slow rate; Small bites/sips; Clear throat intermittently Postural changes: Position pt fully upright for meals Oral care recommendations: Oral care BID (2x/day) Treatment Plan Treatment Plan Treatment recommendations: Therapy as outlined in treatment plan below Follow-up recommendations: No SLP follow up Functional status  assessment: Patient has had a recent decline in their functional status and demonstrates the ability to make significant improvements in function in a reasonable and predictable amount of time. Treatment frequency: Min 2x/week Treatment duration: 1 week Interventions: Aspiration precaution training; Compensatory techniques; Patient/family education; Trials of upgraded texture/liquids Recommendations Recommendations for follow up therapy are one component of a multi-disciplinary discharge planning process, led by the attending physician.  Recommendations may be updated based on patient status, additional functional criteria and insurance authorization. Assessment: Orofacial Exam: Orofacial Exam Oral Cavity: Oral Hygiene: WFL Oral Cavity - Dentition: Dentures, top; Dentures, not available; Poor condition Orofacial Anatomy: WFL Oral Motor/Sensory Function: WFL  Anatomy: Anatomy: Suspected cervical osteophytes; Prominent cricopharyngeus Boluses Administered: Boluses Administered Boluses Administered: Thin liquids (Level 0); Mildly thick liquids (Level 2, nectar thick); Moderately thick liquids (Level 3, honey thick); Puree; Solid  Oral Impairment Domain: Oral Impairment Domain Lip Closure: Interlabial escape, no progression to anterior lip Tongue control during bolus hold: Cohesive bolus between tongue to palatal seal Bolus preparation/mastication: Slow prolonged chewing/mashing with complete recollection Bolus transport/lingual motion: Brisk tongue motion Oral residue: Trace residue lining oral structures Location of oral residue : Tongue; Palate Initiation of pharyngeal swallow : Posterior laryngeal surface of the epiglottis  Pharyngeal Impairment Domain: Pharyngeal Impairment Domain Soft palate elevation: No bolus between soft palate (SP)/pharyngeal wall (PW) Laryngeal elevation: Partial superior movement of thyroid  cartilage/partial approximation of arytenoids to epiglottic petiole Anterior hyoid excursion: Complete anterior movement Epiglottic movement: Complete inversion Laryngeal vestibule closure: Complete, no air/contrast in laryngeal vestibule Pharyngeal stripping wave : Present - complete Pharyngeal contraction (A/P view only): N/A Pharyngoesophageal segment opening: Partial distention/partial duration, partial obstruction of flow Tongue base retraction: Narrow column of contrast or air between tongue base and PPW Pharyngeal residue: Trace residue within or on pharyngeal structures Location of pharyngeal residue: Tongue base; Valleculae; Pyriform sinuses  Esophageal Impairment Domain: No data recorded Pill: No data recorded Penetration/Aspiration Scale Score: Penetration/Aspiration Scale Score 1.  Material does not enter airway: Moderately thick liquids (Level 3, honey thick); Puree; Solid 3.  Material enters airway, remains ABOVE vocal cords and not ejected out:  Thin liquids (Level 0); Mildly thick liquids (Level 2, nectar thick) Compensatory Strategies: Compensatory Strategies Compensatory strategies: No   General Information: Caregiver present: Yes  Diet Prior to this Study: NPO   Temperature : Normal   Respiratory Status: Tachypneic   Supplemental O2: High flow nasal cannula (8L)   History of Recent Intubation: No  Behavior/Cognition: Alert; Cooperative Self-Feeding Abilities: Needs assist with self-feeding Baseline vocal quality/speech: Normal Volitional Cough: Able to elicit Volitional Swallow: Able to elicit Exam Limitations: No limitations Goal Planning: Prognosis for improved oropharyngeal function: Good Barriers to Reach Goals: Cognitive deficits; Time post onset No data recorded Patient/Family Stated Goal: none stated Consulted and agree with results and recommendations: Patient; Nurse Pain: Pain Assessment Pain Assessment: No/denies pain End of Session: Start Time:SLP Start Time (ACUTE ONLY): 1232 Stop Time: SLP Stop Time (ACUTE ONLY): 1250 Time Calculation:SLP Time Calculation (min) (ACUTE ONLY): 18 min Charges: SLP Evaluations $ SLP Speech Visit: 1 Visit SLP Evaluations $BSS Swallow: 1 Procedure $MBS Swallow: 1 Procedure SLP visit diagnosis: SLP Visit Diagnosis: Dysphagia, oropharyngeal phase (R13.12) Past Medical History: Past Medical History: Diagnosis Date  Acute gastric ulcer with hemorrhage 05/06/2013  Arthritis   Asthma   Atrial enlargement, left   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   History of MI (myocardial infarction)   History of nuclear stress test 08/07/2010  dipyridamole; EKG negative for ischemia, low risk scan   Hyperlipidemia   Hyperplastic colon polyp 05/2008   Hypertension   Ischemic cardiomyopathy   EF 45%, with inferior wall motion abnormality   Myocardial infarction (HCC)   Valvular regurgitation   mitral and tricuspid (mild) Past Surgical History: Past Surgical History: Procedure Laterality Date  ABDOMINAL AORTOGRAM W/LOWER EXTREMITY N/A 02/14/2023  Procedure: ABDOMINAL AORTOGRAM W/LOWER EXTREMITY;  Surgeon: Philipp Brawn, MD;  Location: [facility] INVASIVE CV LAB;  Service: Cardiovascular;  Laterality: N/A;  AMPUTATION Left 03/22/2023  Procedure: FIFTH TOE AMPUTATION LEFT FOOT;  Surgeon: Philipp Brawn, MD;  Location: [facility] OR;  Service: Vascular;  Laterality: Left;  CARDIOVERSION N/A 08/16/2022  Procedure: CARDIOVERSION;  Surgeon: Hugh Madura, MD;  Location: [facility] INVASIVE CV LAB;  Service: Cardiovascular;  Laterality: N/A;  CORONARY ARTERY BYPASS GRAFT  2000  LIMA to LAD, free IMA to OM2, sequential graft to PLA & PLD  ESOPHAGOGASTRODUODENOSCOPY N/A 05/07/2013  Procedure: ESOPHAGOGASTRODUODENOSCOPY (EGD);  Surgeon: Kenney Peacemaker, MD;  Location: [facility] ENDOSCOPY;  Service: Endoscopy;  Laterality: N/A;  PERIPHERAL VASCULAR INTERVENTION Left 02/14/2023  Procedure: PERIPHERAL VASCULAR INTERVENTION;  Surgeon: Philipp Brawn, MD;  Location: [facility] INVASIVE CV LAB;  Service: Cardiovascular;  Laterality: Left;  SFA  TEE WITHOUT CARDIOVERSION N/A 08/16/2022  Procedure: TRANSESOPHAGEAL ECHOCARDIOGRAM;  Surgeon: Hugh Madura, MD;  Location: [facility] INVASIVE CV LAB;  Service: Cardiovascular;  Laterality: N/A;  TRANSTHORACIC ECHOCARDIOGRAM  09/07/2010  EF 45-50%; LV systolic function mildly reduced; LA mildly dilated; mild-mod MR & mild-mod TR; aortic root sclerosis/calcification;  Amil Kale, M.A., CCC-SLP Speech Language Pathology, Acute Rehabilitation Services Secure Chat preferred [phone] 05/13/2023, 1:52 PM  VAS US  ABI WITH/WO TBI Result Date: 05/13/2023  LOWER EXTREMITY DOPPLER STUDY Patient Name:  BALDO GERSHON  Date of Exam:   05/13/2023 Medical Rec #: [id number]       Accession #:     [id number] Date of Birth: 1940/08/09       Patient Gender: M Patient Age:   57 years Exam Location:  [facility] Procedure:      VAS US  ABI WITH/WO TBI Referring Phys: Runell Countryman --------------------------------------------------------------------------------  Indications: Ulceration. High Risk Factors: Hypertension, hyperlipidemia, Diabetes, past history of                    smoking, prior MI, coronary artery disease. 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  Vascular Interventions: LLE SFA stent placement on 02/14/2023. Comparison Study: Previous exam on 03/18/2023 Performing Technologist: Arlyce Berger RVT, RDMS  Examination Guidelines: A complete evaluation includes at minimum, Doppler waveform signals and systolic blood pressure reading at the level of bilateral brachial, anterior tibial, and posterior tibial arteries, when vessel segments are accessible. Bilateral testing is considered an integral part of a complete examination. Photoelectric Plethysmograph (PPG) waveforms and toe systolic pressure readings are included as required and additional duplex testing as needed. Limited examinations for reoccurring indications may be performed as noted.  ABI Findings: +---------+------------------+-----+--------+---------+ Right    Rt Pressure (mmHg)IndexWaveformComment   +---------+------------------+-----+--------+---------+ Brachial  PICC line +---------+------------------+-----+--------+---------+ PTA                             biphasicNC        +---------+------------------+-----+--------+---------+ DP                              biphasicNC        +---------+------------------+-----+--------+---------+ Great Toe42                0.33 Abnormal          +---------+------------------+-----+--------+---------+ +---------+------------------+-----+--------+-------+ Left     Lt Pressure (mmHg)IndexWaveformComment  +---------+------------------+-----+--------+-------+ Brachial 129                                    +---------+------------------+-----+--------+-------+ PTA                             biphasicNC      +---------+------------------+-----+--------+-------+ DP                              biphasicNC      +---------+------------------+-----+--------+-------+ Great Toe0                 0.00 Absent          +---------+------------------+-----+--------+-------+ +-------+-----------+-----------+------------+------------+ ABI/TBIToday's ABIToday's TBIPrevious ABIPrevious TBI +-------+-----------+-----------+------------+------------+ Right  [place]         0.33       [place]          0.65         +-------+-----------+-----------+------------+------------+ Left   [place]         absent     [place]          0.51         +-------+-----------+-----------+------------+------------+ Arterial wall calcification precludes accurate ankle pressures and ABIs.  Summary: Right: Resting right ankle-brachial index indicates noncompressible right lower extremity arteries. The right toe-brachial index is abnormal. Left: Resting left ankle-brachial index indicates noncompressible left lower extremity arteries. The left toe-brachial index is absent. *See table(s) above for measurements and observations.  Electronically signed by Delaney Fearing on 05/13/2023 at 1:39:34 PM.    Final    VAS US  LOWER EXTREMITY ARTERIAL DUPLEX Result Date: 05/13/2023 LOWER EXTREMITY ARTERIAL DUPLEX STUDY Patient Name:  GERHARD FERRIN  Date of Exam:   05/13/2023 Medical Rec #: [id number]       Accession #:    [id number] Date of Birth: [birth year]       Patient Gender: M Patient Age:   79 years Exam Location:  [facility] Procedure:      VAS US  LOWER EXTREMITY ARTERIAL DUPLEX Referring Phys: Runell Countryman --------------------------------------------------------------------------------  Indications: Ulceration. High Risk Factors:  Hypertension, hyperlipidemia, Diabetes, past history of                    smoking, prior MI, coronary artery disease. 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  Vascular Interventions: LLA SFA stent placement on 2/030/2025. Current ABI:            RT [place] / 0.33 LT [place]/absent Limitations: Calcific shadowing, poor ultrasound/tissue interface, patient              movement Comparison Study: Previous  exam 03/18/2023 Performing Technologist: Arlyce Berger RVT, RDMS  Examination Guidelines: A complete evaluation includes B-mode imaging, spectral Doppler, color Doppler, and power Doppler as needed of all accessible portions of each vessel. Bilateral testing is considered an integral part of a complete examination. Limited examinations for reoccurring indications may be performed as noted.  +-----------+--------+-----+--------+---------+------------------------+ RIGHT      PSV cm/sRatioStenosisWaveform Comments                 +-----------+--------+-----+--------+---------+------------------------+ CFA Mid    113                  biphasic                          +-----------+--------+-----+--------+---------+------------------------+ CFA Distal 104                  biphasic                          +-----------+--------+-----+--------+---------+------------------------+ DFA        64                   biphasic                          +-----------+--------+-----+--------+---------+------------------------+ SFA Prox   93                   biphasic                          +-----------+--------+-----+--------+---------+------------------------+ SFA Mid    123                  biphasic                          +-----------+--------+-----+--------+---------+------------------------+ SFA Distal 129                  biphasic                          +-----------+--------+-----+--------+---------+------------------------+ POP Prox   98                    biphasic                          +-----------+--------+-----+--------+---------+------------------------+ POP Distal 64                   biphasic                          +-----------+--------+-----+--------+---------+------------------------+ TP Trunk   127                  biphasic                          +-----------+--------+-----+--------+---------+------------------------+ ATA Prox   141                  triphasic                         +-----------+--------+-----+--------+---------+------------------------+ ATA Mid    90  biphasic                          +-----------+--------+-----+--------+---------+------------------------+ ATA Distal                               not visualized - bandage +-----------+--------+-----+--------+---------+------------------------+ PTA Prox   53                   biphasic                          +-----------+--------+-----+--------+---------+------------------------+ PTA Mid    53                   biphasic                          +-----------+--------+-----+--------+---------+------------------------+ PTA Distal 75                   biphasic                          +-----------+--------+-----+--------+---------+------------------------+ PERO Prox  82                   biphasic                          +-----------+--------+-----+--------+---------+------------------------+ PERO Mid   90                   biphasic                          +-----------+--------+-----+--------+---------+------------------------+ PERO Distal81                   biphasic                          +-----------+--------+-----+--------+---------+------------------------+ DP         85                   biphasic                          +-----------+--------+-----+--------+---------+------------------------+  +-----------+--------+-----+---------------+----------+---------------+ LEFT        PSV cm/sRatioStenosis       Waveform  Comments        +-----------+--------+-----+---------------+----------+---------------+ CFA Mid    136                         biphasic                  +-----------+--------+-----+---------------+----------+---------------+ CFA Distal 123                         biphasic                  +-----------+--------+-----+---------------+----------+---------------+ DFA        195          30-49% stenosis          calcific plaque +-----------+--------+-----+---------------+----------+---------------+ SFA Prox   145                         biphasic                  +-----------+--------+-----+---------------+----------+---------------+  SFA Mid    94                          biphasic                  +-----------+--------+-----+---------------+----------+---------------+ POP Prox   95                          biphasic                  +-----------+--------+-----+---------------+----------+---------------+ POP Mid                                          calcific plaque +-----------+--------+-----+---------------+----------+---------------+ POP Distal 76                          biphasic                  +-----------+--------+-----+---------------+----------+---------------+ TP Trunk   88                          biphasic                  +-----------+--------+-----+---------------+----------+---------------+ ATA Prox   243          50-74% stenosisbiphasic                  +-----------+--------+-----+---------------+----------+---------------+ ATA Mid    65                          monophasic                +-----------+--------+-----+---------------+----------+---------------+ ATA Distal 65                          monophasic                +-----------+--------+-----+---------------+----------+---------------+ PTA Prox   87                          biphasic                   +-----------+--------+-----+---------------+----------+---------------+ PTA Mid    69                          biphasic                  +-----------+--------+-----+---------------+----------+---------------+ PTA Distal 139                         biphasic                  +-----------+--------+-----+---------------+----------+---------------+ PERO Prox  114                         biphasic                  +-----------+--------+-----+---------------+----------+---------------+ PERO Mid   109                         biphasic                  +-----------+--------+-----+---------------+----------+---------------+  PERO Distal63                          biphasic                  +-----------+--------+-----+---------------+----------+---------------+ DP         178                         monophasic                +-----------+--------+-----+---------------+----------+---------------+  Left Stent(s): +----------------+--------+--------+--------+--------+ LLE SFA (distal)PSV cm/sStenosisWaveformComments +----------------+--------+--------+--------+--------+ Prox to Stent   101             biphasic         +----------------+--------+--------+--------+--------+ Proximal Stent  97              biphasic         +----------------+--------+--------+--------+--------+ Mid Stent       105             biphasic         +----------------+--------+--------+--------+--------+ Distal Stent    98              biphasic         +----------------+--------+--------+--------+--------+ Distal to Stent 80              biphasic         +----------------+--------+--------+--------+--------+    Summary: Right: No evidence of significant stenosis seen in the RLE. Significant arterial wall calcifications seen throughout extremity. Left: 30-49% stenosis noted in the deep femoral artery. 50-74% stenosis noted in the proximal anterior tibial artery. Distal SFA stent appears  patent wihtout evidence of stenosis. Signifcant arterial wall calcifications seen throughout extremity.  See table(s) above for measurements and observations. Electronically signed by Delaney Fearing on 05/13/2023 at 1:39:10 PM.    Final    US  EKG SITE RITE Result Date: 05/12/2023 If Site Rite image not attached, placement could not be confirmed due to current cardiac rhythm.  CT Chest Wo Contrast Result Date: 05/12/2023 CLINICAL DATA:  Pneumonia with effusion. EXAM: CT CHEST WITHOUT CONTRAST TECHNIQUE: Multidetector CT imaging of the chest was performed following the standard protocol without IV contrast. RADIATION DOSE REDUCTION: This exam was performed according to the departmental dose-optimization program which includes automated exposure control, adjustment of the mA and/or kV according to patient size and/or use of iterative reconstruction technique. COMPARISON:  Chest x-ray 05/12/2023. FINDINGS: Cardiovascular: The heart is enlarged. No substantial pericardial effusion. Status post CABG. Moderate atherosclerotic calcification is noted in the wall of the thoracic aorta. Mediastinum/Nodes: No mediastinal lymphadenopathy. No evidence for gross hilar lymphadenopathy although assessment is limited by the lack of intravenous contrast on the current study. The esophagus has normal imaging features. There is no axillary lymphadenopathy. Lungs/Pleura: Dense consolidative airspace disease is seen in the left upper lobe with left lower lobe collapse/consolidation. Patchy nodular airspace opacity is seen in the posterior right upper lobe in scattered through the right middle lobe. Dense consolidative airspace disease noted right lower lobe. There is a moderate left pleural effusion with no substantial right-sided pleural fluid collection. Upper Abdomen: Small hypodensities in the liver parenchyma measure up to about 11 mm. These cannot be definitively characterized. Numerous calcified gallstones evident, incompletely  visualized. 19 mm exophytic lesion upper interpolar left kidney has been incompletely visualized but approaches water density and is likely a cyst. Musculoskeletal: Bones are diffusely demineralized. Multiple nonacute rib fractures  are identified bilaterally T12 compression fracture is age indeterminate but new since abdomen/pelvis CT of 08/19/2022. IMPRESSION: 1. Dense consolidative airspace disease in the left upper lobe with left lower lobe collapse/consolidation. Patchy nodular airspace opacity in the posterior right upper lobe and scattered through the right middle lobe. Dense consolidative airspace disease right lower lobe. Imaging features compatible with multifocal pneumonia. 2. Moderate left pleural effusion. 3. Cholelithiasis. 4. Scattered small low-density liver lesions cannot be definitively characterized on this noncontrast study. While likely benign, follow-up CT or ultrasound in 3 months recommended to ensure stability. Given the patient's extensive lung disease, immediate follow-up MRI of the abdomen is not recommended due to the potential for substantial imitation by motion artifact. 5. T12 compression fracture is age indeterminate but new since abdomen/pelvis CT of 08/19/2022. 6.  Aortic Atherosclerosis (ICD10-I70.0). Electronically Signed   By: Donnal Fusi M.D.   On: 05/12/2023 06:10   CT HEAD WO CONTRAST Result Date: 05/12/2023 EXAM: CT HEAD WITHOUT 05/12/2023 03:55:56 AM TECHNIQUE: CT of the head was performed without the administration of intravenous contrast. Automated exposure control, iterative reconstruction, and/or weight based adjustment of the mA/kV was utilized to reduce the radiation dose to as low as reasonably achievable. COMPARISON: 04/25/2023 CLINICAL HISTORY: Mental status change, unknown cause. FINDINGS: BRAIN AND VENTRICLES: There is no acute intracranial hemorrhage, mass effect or midline shift. No abnormal extra-axial fluid collection. The gray-white differentiation is  maintained without evidence of an acute infarct. There is no evidence of hydrocephalus. Global cortical atrophy. Subcortical and periventricular small vessel ischemic changes. ORBITS: The visualized portion of the orbits demonstrate no acute abnormality. SINUSES: The visualized paranasal sinuses and mastoid air cells demonstrate no acute abnormality. SOFT TISSUES AND SKULL: No acute abnormality of the visualized skull or soft tissues. IMPRESSION: 1. No acute intracranial abnormality. 2. Atrophy with small vessel ischemic changes. Electronically signed by: Zadie Herter MD 05/12/2023 03:59 AM EDT RP Workstation: [id number]   DG Foot 2 Views Left Result Date: 05/12/2023 EXAM: 2 VIEW(S) XRAY OF THE LEFT FOOT 05/12/2023 03:46:00 AM COMPARISON: None available. CLINICAL HISTORY: Possible osteomyelitis. Encounter for altered mental status, possible osteomyelitis. 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 Destructive changes involving the fifth metatarsal head, suggesting acute osteomyelitis. Small plantar and posterior calcaneal osteophytes. SOFT TISSUES: Adjacent soft tissue swelling/ulcer. Vascular calcifications. IMPRESSION: 1. Destructive changes involving the left fifth metatarsal head, suggesting acute osteomyelitis, with adjacent soft tissue swelling/ulcer. 2. 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 Electronically signed by: Zadie Herter MD 05/12/2023 03:50 AM EDT RP Workstation: [id number]   DG Chest Port 1 View Result Date: 05/12/2023 EXAM: 1 VIEW XRAY OF THE CHEST 05/12/2023 03:45:00 AM COMPARISON: 04/25/2023 CLINICAL HISTORY: Altered mental status. Encounter for altered mental status, septic foot, ulcer on the side and swollen. FINDINGS: LUNGS AND PLEURA: Near complete opacification of the left hemithorax, favoring a moderate to large left pleural effusion with underlying atelectasis, although obscured by  defibrillator pads. The right lung is clear. HEART AND MEDIASTINUM: Postsurgical changes related to prior CABG. Median sternotomy. BONES AND SOFT TISSUES: No acute osseous abnormality. IMPRESSION: 1. Near complete opacification of the left hemithorax, favoring a moderate to large left pleural effusion with underlying atelectasis, although obscured. Electronically signed by: Zadie Herter MD 05/12/2023 03:49 AM EDT RP Workstation: [id number]     Assessment/Plan: Multifocal pneumonia c/b pleural effusion in immunocompromised host= continue with iv abtx and agree with plan for thoracentesis. Please  send fluid for culture/light's criteria.  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 And can transition to orals as discharge  Therapeutic drug monitoring = will closely follow vancomycin  levels and cr function.  Hx of covid-19 = he is 30d out from illness. No airborne/contact needed  [facility] Pager: [phone]  05/13/2023, 4:55 PM

## 2023-05-13 NOTE — Progress Notes (Signed)
 ABI and BLE arterial duplex ultrasounds have been completed.    Results can be found under chart review under CV PROC. 05/13/2023 1:16 PM [place] Souter RVT, RDMS

## 2023-05-13 NOTE — Progress Notes (Signed)
 Modified Barium Swallow Study  Patient Details  Name: Donald Rios MRN: [id number] Date of Birth: 02-27-1940  Today's Date: 05/13/2023  Modified Barium Swallow completed.  Full report located under Chart Review in the Imaging Section.  History of Present Illness Donald Rios is an 83 yo male presenting to ED 5/1 with AMS and hypoglycemia. Recently admitted 4/1 to [facility] for sepsis secondary to LLE cellulitis complicated by hypotension. CT Chest concerning for L hemithorax opacification and patchy nodular airspace opacity in the posterior RUL and scattered throughout the RLL with dense consolidative airspace disease through the RLL, concerning for multifocal PNA. Seen by SLP 08/14/22 with WFL oropharyngeal swallow. PMH includes CAD s/p CABG, COPD, DM, ischemic cardiomyopathy with chronic HFrEF, A-fib on Eliquis , GERD   Clinical Impression Pt presents with mild oropharyngeal dysphagia which is suspected to be impacted by coordination of breathing and swallowing secondary to acute change in respiratory status. Pt is currently on 8 LPM HFNC and was quite anxious re: his breathing which resulted in increased WOB and tachypnea. Pt's RR and SpO2 overall remained stable when challeneged with presentations of sequential sips. He initiates a swallow at the level of the valleculae but due to incomplete laryngeal elevation, penetration occurs above the level of the vocal folds with thin and nectar thick liquids (PAS 3). Penetrates were expelled when pt was cued to clear his throat. BOT retraction is incomplete resulting in trace pharyngeal residue. Mastication was mildly prolonged with regular solids as pt's dentures are unavailable. Recommend initiating a diet of Dys 3 solids with thin liquids using intermittent throat clearance. If pt's dentures become available over the weekend, pt's diet can be upgraded to regular per RN/MD/RD discretion. SLP will f/u at least briefly to provide education. Factors that may  increase risk of adverse event in presence of aspiration Donald Rios & Donald Rios 2021): Poor general health and/or compromised immunity;Respiratory or GI disease;Limited mobility;Frail or deconditioned;Inadequate oral hygiene  Swallow Evaluation Recommendations Recommendations: PO diet PO Diet Recommendation: Dysphagia 3 (Mechanical soft);Thin liquids (Level 0) Liquid Administration via: Cup;Straw Medication Administration: Whole meds with puree Supervision: Staff to assist with self-feeding;Full supervision/cueing for swallowing strategies Swallowing strategies  : Minimize environmental distractions;Slow rate;Small bites/sips;Clear throat intermittently Postural changes: Position pt fully upright for meals Oral care recommendations: Oral care BID (2x/day)    Amil Kale, M.A., CCC-SLP Speech Language Pathology, Acute Rehabilitation Services  Secure Chat preferred [phone]  05/13/2023,1:48 PM

## 2023-05-13 NOTE — Progress Notes (Signed)
 RT held CPT at this time due to bedside ECHO taking place. RT will resume at next scheduled time.

## 2023-05-13 NOTE — Inpatient Diabetes Management (Signed)
 Inpatient Diabetes Program Recommendations  AACE/ADA: New Consensus Statement on Inpatient Glycemic Control (2015)  Target Ranges:  Prepandial:   less than 140 mg/dL      Peak postprandial:   less than 180 mg/dL (1-2 hours)      Critically ill patients:  140 - 180 mg/dL   Lab Results  Component Value Date   GLUCAP 241 (H) 05/13/2023   HGBA1C 5.0 05/13/2023    Review of Glycemic Control  Latest Reference Range & Units 05/12/23 07:42 05/12/23 10:49 05/12/23 15:24 05/12/23 19:49 05/12/23 23:42 05/13/23 04:06 05/13/23 07:42 05/13/23 08:50  Glucose-Capillary 70 - 99 mg/dL 409 (H) 811 (H) 914 (H) 222 (H) 255 (H) 177 (H) 236 (H) 241 (H)  (H): Data is abnormally high  Diabetes history: DM2 Outpatient Diabetes medications: Novolog  0-9 units TID and Glipizide  5 mg QD Current orders for Inpatient glycemic control: Novolog  0-6 units TID, Solumedrol 40 mg Q12H  Inpatient Diabetes Program Recommendations:    While receiving steroids, please consider:  Novolog  0-15 units Q4H.    Will continue to follow while inpatient.  Thank you, Hays Lipschutz, MSN, CDCES Diabetes Coordinator Inpatient Diabetes Program [phone] (team pager from 8a-5p)

## 2023-05-13 NOTE — Progress Notes (Signed)
 PHARMACY - ANTICOAGULATION CONSULT NOTE  Pharmacy Consult for heparin  Indication: atrial fibrillation  No Known Allergies  Patient Measurements: Height: 5\' 8"  (172.7 cm) Weight: 58.1 kg (128 lb 1.4 oz) IBW/kg (Calculated) : 68.4 HEPARIN  DW (KG): 64  Vital Signs: Temp: 98.4 F (36.9 C) (05/02 1525) Temp Source: Oral (05/02 1525) BP: 119/54 (05/02 1200) Pulse Rate: 95 (05/02 1200)  Labs: Recent Labs    05/12/23 0327 05/12/23 0530 05/12/23 0756 05/12/23 1355 05/12/23 2334 05/13/23 0450 05/13/23 0602  HGB 7.7*  --  8.2*  --   --  7.0*  --   HCT 25.5*  --  24.0*  --   --  22.6*  --   PLT 214  --   --   --   --  236  --   APTT  --   --   --   --  >200* 180*  --   HEPARINUNFRC  --   --   --   --  >1.10*  --  >1.10*  CREATININE 0.90  --   --  1.10  --  1.02  --   TROPONINIHS 81* 39*  --   --   --   --   --     Estimated Creatinine Clearance: 46.7 mL/min (by C-G formula based on SCr of 1.02 mg/dL).   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    History of MI (myocardial infarction)    History of nuclear stress test 08/07/2010   dipyridamole; EKG negative for ischemia, low risk scan    Hyperlipidemia    Hyperplastic colon polyp 05/2008   Hypertension    Ischemic cardiomyopathy    EF 45%, with inferior wall motion abnormality    Myocardial infarction (HCC)    Valvular regurgitation    mitral and tricuspid (mild)    Medications:  Scheduled:   amiodarone   200 mg Oral q morning   ascorbic acid   250 mg Oral BID    atorvastatin   40 mg Oral Daily   Chlorhexidine  Gluconate Cloth  6 each Topical Daily   famotidine   20 mg Oral Daily   guaiFENesin   5 mL Oral Q4H   insulin  aspart  0-6 Units Subcutaneous Q4H   ipratropium-albuterol   3 mL Nebulization Q6H   leptospermum manuka honey  1 Application Topical Daily   midodrine   15 mg Oral Q8H   mirtazapine   30 mg Oral QHS   multivitamin with minerals  1 tablet Per Tube Daily   nutrition supplement (JUVEN)  1 packet Oral BID BM   mouth rinse  15 mL Mouth Rinse 4 times per day   polyethylene glycol  17 g Oral Daily   [START ON 05/14/2023] predniSONE   40 mg Oral Q breakfast   pyridOXINE   25 mg Oral Daily   senna-docusate  1 tablet Oral QHS   sodium chloride  flush  10-40 mL Intracatheter Q12H   sodium chloride  flush  3 mL  Intravenous Q12H   zinc  sulfate (50mg  elemental zinc )  220 mg Oral Daily    Assessment: 54 YOM who presented with with lethargy and hypoglycemia. He became hypoxic and CXR was positive for L hemithroax opacification. Concern for possible pneumonia and osteomyelitis of food was also raised. He has a history of atrial fibrillation for which he takes Eliquis . Last dose was on 4/30 at 2100. Due to potential for procedures, pharmacy has been consulted to dose heparin .   OK to restart heparin , planning thoracentesis tomorrow, heparin  to stop at 10a.   Hgb 7, pltc 236  Goal of Therapy:  Heparin  level 0.3-0.7 units/ml aPTT 66-102 seconds Monitor platelets by anticoagulation protocol: Yes   Plan:  RESTART heparin  IV at 650 units/hr 8 hour aPTT Obtain aPTT, heparin  level and CBC daily while on heparin  infusion Monitor for s/sx of bleeding F/U after thoracentesis tomorrow  Cecillia Cogan, PharmD Clinical Pharmacist 05/13/2023  4:43 PM

## 2023-05-13 NOTE — Progress Notes (Signed)
 Awaiting non-invasive testing. Not yet a candidate for intervention. Will check back Monday. Please call on-call team for questions or issues in the interim.  Donald Rios. [name], MD [facility] Phone Number: [phone] 05/13/2023 12:02 PM

## 2023-05-13 NOTE — Progress Notes (Signed)
 Initial Nutrition Assessment  DOCUMENTATION CODES:  Severe malnutrition in context of chronic illness  INTERVENTION:  Continue current diet as ordered, encourage PO intake 1 packet Juven BID, each packet provides 95 calories, 2.5 grams of protein (collagen), and 9.8 grams of carbohydrate (3 grams sugar); also contains 7 grams of L-arginine and L-glutamine, 300 mg vitamin C , 15 mg vitamin E, 1.2 mcg vitamin B-12, 9.5 mg zinc , 200 mg calcium , and 1.5 g  Calcium  Beta-hydroxy-Beta-methylbutyrate to support wound healing Magic cup TID with meals, each supplement provides 290 kcal and 9 grams of protein Carnation Instant Breakfast BID- each packet provides 130kcal and 5g protein +milk Replace vitamin C  (250mg  BID x 30 days), zinc  (220mg  x 14 days), and B6 (25mcg x 14 days)  NUTRITION DIAGNOSIS:  Severe Malnutrition related to chronic illness (COPD, CHF) as evidenced by severe fat depletion, severe muscle depletion, percent weight loss (17.5% x 6 months).  GOAL:  Patient will meet greater than or equal to 90% of their needs  MONITOR:  PO intake, Supplement acceptance, Skin, Labs  REASON FOR ASSESSMENT:  Consult Assessment of nutrition requirement/status  ASSESSMENT:  Pt with hx of DM type 2, COPD, CAD, CHF, atrial fibrillation, PAD, gout, HTN, and HLD presented to ED with lethargy and hypoxia. Found to be in septic shock likely due to osteomyelitis.  5/1 - admitted to ICU 5/2 - MBS, DYS3/thin liquids  Pt resting in bed at the time of assessment, wife at bedside. Pt able to provide a nutrition hx. States that his appetite has been poor for weeks. Wife endorses that he ate and drank very little while he was at the rehab facility.  Discussed intake here, pt prefers to eat soup. Does not like ensure. Did discuss importance of adequate nutrition for wound healing. Agreeable to Magic cup, juven, and carnation instant breakfast.  Noted that pt has been followed by RD team at prior admission and  was dx with severe malnutrition and multiple micronutrient deficiencies. Will replace Vitamin C , Zinc , and B6 and they resulted after discharge.   Admit weight: 64 kg   Current weight: 58.1 kg  17.5% weight loss x 6 month (11/2022-05/2023) which is severe   Intake/Output Summary (Last 24 hours) at 05/13/2023 1606 Last data filed at 05/13/2023 1300 Gross per 24 hour  Intake 3055.98 ml  Output 350 ml  Net 2705.98 ml  Net IO Since Admission: 5,456.63 mL [05/13/23 1606]  Drains/Lines: PICC triple lumen UOP x 24 hours  Nutritionally Relevant Medications: Scheduled Meds:  atorvastatin   40 mg Oral Daily   insulin  aspart  0-6 Units Subcutaneous Q4H   methylPREDNISolone   40 mg Intravenous Q12H   pantoprazole   40 mg Oral Daily   Continuous Infusions:  ceFEPime  (MAXIPIME ) IV 2 g (05/13/23 0601)   lactated ringers  40 mL/hr at 05/13/23 0600   norepinephrine  (LEVOPHED ) Adult infusion 13 mcg/min (05/13/23 0846)   PRN Meds: senna-docusate  Labs Reviewed: Sodium 128 CBG ranges from 66-255 mg/dL over the last 24 hours HgbA1c 5.0%  Micronutrient Labs from 04/14/23:  Reference Range  04/14/23   CRP <1.0 mg/dL 8.8 (H)  Vitamin D , 25-Hydroxy 30 - 100 ng/mL 20.65 (L)  Vitamin A   22.0 - 69.5 ug/dL 16.1 (L)  Vitamin B12 096 - 914 pg/mL 533  Vitamin B6 3.4 - 65.2 ug/L 2.6 (L)  Vitamin C  0.4 - 2.0 mg/dL 0.3 (L)  Zinc  44 - 115 ug/dL 36 (L)   NUTRITION - FOCUSED PHYSICAL EXAM: Flowsheet Row Most Recent Value  Orbital Region Severe depletion  Upper Arm Region Moderate depletion  Thoracic and Lumbar Region Moderate depletion  Buccal Region Severe depletion  Temple Region Severe depletion  Clavicle Bone Region Moderate depletion  Clavicle and Acromion Bone Region Moderate depletion  Scapular Bone Region Moderate depletion  Dorsal Hand Severe depletion  Patellar Region Severe depletion  Anterior Thigh Region Severe depletion  Posterior Calf Region Severe depletion  Edema (RD Assessment)  None  Hair Reviewed  Eyes Reviewed  Mouth Reviewed  Skin Reviewed  Nails Reviewed    Diet Order:   Diet Order             DIET DYS 3 Room service appropriate? Yes with Assist; Fluid consistency: Thin  Diet effective now                   EDUCATION NEEDS:  Education needs have been addressed  Skin:  Skin Assessment: Reviewed RN Assessment Stage 2:  - sacrum Unstageable: - right heel - left heel - right knee Venous Stasis Ulcer - right foot - left ulcer Skin Tear/Incisions: - right hand - left arm  Last BM:  4/29  Height:  Ht Readings from Last 1 Encounters:  05/12/23 5\' 8"  (1.727 m)    Weight:  Wt Readings from Last 1 Encounters:  05/13/23 58.1 kg    Ideal Body Weight:  70 kg  BMI:  Body mass index is 19.48 kg/m.  Estimated Nutritional Needs:  Kcal:  1700-1900 kcal/d Protein:  85-100 g/d Fluid:  1.8L/d    Edwena Graham, RD, LDN Registered Dietitian II Please reach out via secure chat

## 2023-05-13 NOTE — Plan of Care (Signed)
  Problem: Metabolic: Goal: Ability to maintain appropriate glucose levels will improve Outcome: Progressing   Problem: Clinical Measurements: Goal: Ability to maintain clinical measurements within normal limits will improve Outcome: Progressing   Problem: Education: Goal: Ability to describe self-care measures that may prevent or decrease complications (Diabetes Survival Skills Education) will improve Outcome: Not Progressing Goal: Individualized Educational Video(s) Outcome: Not Progressing   Problem: Coping: Goal: Ability to adjust to condition or change in health will improve Outcome: Not Progressing   Problem: Fluid Volume: Goal: Ability to maintain a balanced intake and output will improve Outcome: Not Progressing   Problem: Health Behavior/Discharge Planning: Goal: Ability to identify and utilize available resources and services will improve Outcome: Not Progressing Goal: Ability to manage health-related needs will improve Outcome: Not Progressing   Problem: Nutritional: Goal: Maintenance of adequate nutrition will improve Outcome: Not Progressing Goal: Progress toward achieving an optimal weight will improve Outcome: Not Progressing   Problem: Skin Integrity: Goal: Risk for impaired skin integrity will decrease Outcome: Not Progressing   Problem: Tissue Perfusion: Goal: Adequacy of tissue perfusion will improve Outcome: Not Progressing   Problem: Fluid Volume: Goal: Hemodynamic stability will improve Outcome: Not Progressing   Problem: Clinical Measurements: Goal: Diagnostic test results will improve Outcome: Not Progressing Goal: Signs and symptoms of infection will decrease Outcome: Not Progressing   Problem: Respiratory: Goal: Ability to maintain adequate ventilation will improve Outcome: Not Progressing   Problem: Education: Goal: Knowledge of General Education information will improve Description: Including pain rating scale, medication(s)/side  effects and non-pharmacologic comfort measures Outcome: Not Progressing   Problem: Health Behavior/Discharge Planning: Goal: Ability to manage health-related needs will improve Outcome: Not Progressing   Problem: Clinical Measurements: Goal: Will remain free from infection Outcome: Not Progressing Goal: Diagnostic test results will improve Outcome: Not Progressing Goal: Respiratory complications will improve Outcome: Not Progressing Goal: Cardiovascular complication will be avoided Outcome: Not Progressing   Problem: Activity: Goal: Risk for activity intolerance will decrease Outcome: Not Progressing   Problem: Nutrition: Goal: Adequate nutrition will be maintained Outcome: Not Progressing   Problem: Coping: Goal: Level of anxiety will decrease Outcome: Not Progressing   Problem: Elimination: Goal: Will not experience complications related to bowel motility Outcome: Not Progressing Goal: Will not experience complications related to urinary retention Outcome: Not Progressing   Problem: Pain Managment: Goal: General experience of comfort will improve and/or be controlled Outcome: Not Progressing   Problem: Safety: Goal: Ability to remain free from injury will improve Outcome: Not Progressing   Problem: Skin Integrity: Goal: Risk for impaired skin integrity will decrease Outcome: Not Progressing

## 2023-05-13 NOTE — Progress Notes (Signed)
 Upon chart review patient has been admitted to [facility]. Routed message to [facility] RNCM   Barnie Bora  [facility]  [facility] Guide  Direct Dial: [phone]  Fax [phone]

## 2023-05-13 NOTE — Consult Note (Signed)
 NAME:  Donald Rios, MRN:  [id number], DOB:  1940/08/10, LOS: 1 ADMISSION DATE:  05/12/2023, CONSULTATION DATE: 5/1 REFERRING MD: Dr. Nichole Barker, CHIEF COMPLAINT: Hypotension  History of Present Illness:  83 year old male with past medical history as below, which is significant for CAD status post CABG, COPD, diabetes mellitus, ischemic cardiomyopathy with chronic HFrEF, and atrial fibrillation on Eliquis .  He was recently admitted 4/1 to [facility] health for sepsis secondary to left lower extremity cellulitis.  Course was complicated by hypotension which was felt to be secondary to hypovolemia, sepsis, or adrenal insufficiency.  He was started on midodrine  and fludrocortisone  with improvement.  He was treated for cellulitis with antibiotics and was discharged to SNF.  He has since been discharged from SNF to home.  He was in his usual state of health until approximately 4/30 when he became lethargic around 2 PM.  EMS was called and he was found to be hypoglycemic to 60 which improved with dextrose  administration.  He was transported to [facility] emergency department for further evaluation.  Initially upon arrival he was minimally responsive, but improved over the course of his stay in the ED.  He was hypoxemic to the mid 80s on room air and hypotensive.  Imaging of the chest was concerning for L hemithorax opacification and CT was done showing moderal left pleural effusion and atelectasis vs pneumonia as well as a right sided pneumonia. He was placed on BiPAP for hypoxia and started on empiric antibiotics. He was admitted to the hospitalists. Despite BiPAP he remained somnolent. And hypotension persisted as well. PCCM was asked to evaluate for ICU transfer.   Pertinent  Medical History   has a past medical history of Acute gastric ulcer with hemorrhage (05/06/2013), Arthritis, Asthma, Atrial enlargement, left, 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, Cataract, COPD (chronic obstructive pulmonary disease)  (HCC), Diabetes mellitus, Diabetic nephropathy (HCC), Diverticulosis, ED (erectile dysfunction), GERD (gastroesophageal reflux disease), Gout, Hearing loss, Hiatal hernia (05/2008), History of MI (myocardial infarction), History of nuclear stress test (08/07/2010), Hyperlipidemia, Hyperplastic colon polyp (05/2008), Hypertension, Ischemic cardiomyopathy, Myocardial infarction (HCC), and Valvular regurgitation.   Significant Hospital Events: Including procedures, antibiotic start and stop dates in addition to other pertinent events   5/2 admitted for respiratory failure and pneumonia, shock  Interim History / Subjective:   Patient feeling ok today, productive cough No acute complaints  Objective   Blood pressure (!) 128/36, pulse 88, temperature 97.7 F (36.5 C), temperature source Oral, resp. rate 20, height 5\' 8"  (1.727 m), weight 58.1 kg, SpO2 96%.        Intake/Output Summary (Last 24 hours) at 05/13/2023 0751 Last data filed at 05/13/2023 0600 Gross per 24 hour  Intake 3409.03 ml  Output 350 ml  Net 3059.03 ml   Filed Weights   05/12/23 0403 05/12/23 1052 05/13/23 0511  Weight: 64 kg 58 kg 58.1 kg   Examination: General: Elderly, chronically ill appearing male in NAD HENT: [place]/AT, PERRL, no JVD Lungs: Rhonchi throughout all lung fields.  Cardiovascular: irrgularly irregular rate. No MRG Abdomen: Soft, NT, ND Extremities: Multiple ulcerations on left foot and well and R lateral lower extremity pictured below. 2+ pitting edema to bilateral lower extremities.  Neuro: Alert, oriented, non-focal  Resolved Hospital Problem list     Assessment & Plan:   Acute respiratory failure: multifocal pneumonia + pleural effusion. - PRN Bipap - Keep sats > 90% - Empiric HAP treatment with cefepime /vancomycin  - Plan for thoracentesis at bedside tomorrow, will hold  heparin  - Pulmonary hygiene  - Sputum culture if able.   Sepsis secondary to PNA, osteomyelitis Hypotension - Hypotensive  but no evidence of end organ damage - give albumin  doses - Gentle IVF - Midodrine  - Will start NE and have PICC placed.  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 - Appreciate vascular surgery consult - WOC consult  Chronic HFrEF: LVEF 25% secondary to ischemic cardiomyopathy - hold home metoprolol  and spironolactone  for now - check trop  Atrial fibrillation - Holding Eliquis  in anticipation of possible surgery - Amiodarone  continue - IV heparin   Hypokalemia - Replace K - Trend lytes  Normocytic anemia - Hemoglobin at his recent baseline - trend CBC - Transfuse for hemoglobin < 7  DM - CBG monitoring and SSI   Best Practice (right click and "Reselect all SmartList Selections" daily)   Diet/type: dysphagia diet (see orders) DVT prophylaxis systemic heparin   Pressure ulcer(s): identified on: 01/May/2025 sacral and foot present on admission GI prophylaxis: N/A Lines: Central line Foley:  N/A Code Status:  full code Last date of multidisciplinary goals of care discussion [ ]   Labs   CBC: Recent Labs  Lab 05/12/23 0327 05/12/23 0756 05/13/23 0450  WBC 9.1  --  36.2*  NEUTROABS 7.9*  --   --   HGB 7.7* 8.2* 7.0*  HCT 25.5* 24.0* 22.6*  MCV 98.1  --  96.6  PLT 214  --  236    Basic Metabolic Panel: Recent Labs  Lab 05/12/23 0327 05/12/23 0530 05/12/23 0756 05/12/23 1355 05/13/23 0450  NA 139  --  135 136 128*  K 2.0*  --  2.7* 3.2* 3.5  CL 101  --   --  102 98  CO2 30  --   --  23 20*  GLUCOSE 118*  --   --  163* 420*  BUN 8  --   --  9 11  CREATININE 0.90  --   --  1.10 1.02  CALCIUM  7.2*  --   --  7.2* 6.7*  MG  --  0.8*  --  3.1* 2.3  PHOS  --   --   --  2.2* 2.8   GFR: Estimated Creatinine Clearance: 46.7 mL/min (by C-G formula based on SCr of 1.02 mg/dL). Recent Labs  Lab 05/12/23 0327 05/12/23 0334 05/12/23 0530 05/12/23 0541 05/13/23 0450  PROCALCITON  --   --  <0.10  --   --   WBC  9.1  --   --   --  36.2*  LATICACIDVEN  --  1.4  --  0.6  --     Liver Function Tests: Recent Labs  Lab 05/12/23 0327 05/12/23 1355  AST 24 32  ALT 13 13  ALKPHOS 81 75  BILITOT 1.1 0.9  PROT 4.0* 4.0*  ALBUMIN  1.7* 1.6*   No results for input(s): "LIPASE", "AMYLASE" in the last 168 hours. Recent Labs  Lab 05/12/23 1355  AMMONIA 13    ABG    Component Value Date/Time   PHART 7.398 05/12/2023 0756   PCO2ART 39.5 05/12/2023 0756   PO2ART 85 05/12/2023 0756   HCO3 24.4 05/12/2023 0756   TCO2 26 05/12/2023 0756   O2SAT 96 05/12/2023 0756     Coagulation Profile: No results for input(s): "INR", "PROTIME" in the last 168 hours.  Cardiac Enzymes: No results for input(s): "CKTOTAL", "CKMB", "CKMBINDEX", "TROPONINI" in the last 168 hours.  HbA1C: Hgb A1c MFr Bld  Date/Time  Value Ref Range Status  05/13/2023 05:00 AM 5.0 4.8 - 5.6 % Final    Comment:    (NOTE) Pre diabetes:          5.7%-6.4%  Diabetes:              >6.4%  Glycemic control for   <7.0% adults with diabetes   02/08/2023 10:52 PM 6.2 (H) 4.8 - 5.6 % Final    Comment:    (NOTE) Pre diabetes:          5.7%-6.4%  Diabetes:              >6.4%  Glycemic control for   <7.0% adults with diabetes     CBG: Recent Labs  Lab 05/12/23 1049 05/12/23 1524 05/12/23 1949 05/12/23 2342 05/13/23 0406  GLUCAP 227* 163* 222* 255* 177*    Critical care time: 40 mins     Duaine German, MD [facility] Office: [phone]   See Amion for personal pager PCCM on call pager [phone] until 7pm. Please call Elink 7p-7a. [phone]

## 2023-05-13 NOTE — Progress Notes (Signed)
 Patient refused prevalon boots. Educated patient on use of prevalon boots and importance considering wounds feet but patient continued to refuse.

## 2023-05-14 ENCOUNTER — Other Ambulatory Visit: Payer: Self-pay

## 2023-05-14 ENCOUNTER — Inpatient Hospital Stay (HOSPITAL_COMMUNITY)

## 2023-05-14 DIAGNOSIS — J9601 Acute respiratory failure with hypoxia: Secondary | ICD-10-CM | POA: Diagnosis not present

## 2023-05-14 DIAGNOSIS — Z515 Encounter for palliative care: Secondary | ICD-10-CM | POA: Diagnosis not present

## 2023-05-14 DIAGNOSIS — J90 Pleural effusion, not elsewhere classified: Secondary | ICD-10-CM | POA: Diagnosis not present

## 2023-05-14 DIAGNOSIS — Z48813 Encounter for surgical aftercare following surgery on the respiratory system: Secondary | ICD-10-CM | POA: Diagnosis not present

## 2023-05-14 DIAGNOSIS — E876 Hypokalemia: Secondary | ICD-10-CM | POA: Diagnosis not present

## 2023-05-14 DIAGNOSIS — Z7189 Other specified counseling: Secondary | ICD-10-CM

## 2023-05-14 DIAGNOSIS — A419 Sepsis, unspecified organism: Secondary | ICD-10-CM | POA: Diagnosis not present

## 2023-05-14 DIAGNOSIS — J9690 Respiratory failure, unspecified, unspecified whether with hypoxia or hypercapnia: Secondary | ICD-10-CM | POA: Diagnosis not present

## 2023-05-14 DIAGNOSIS — I4891 Unspecified atrial fibrillation: Secondary | ICD-10-CM | POA: Diagnosis not present

## 2023-05-14 DIAGNOSIS — J189 Pneumonia, unspecified organism: Secondary | ICD-10-CM | POA: Diagnosis not present

## 2023-05-14 DIAGNOSIS — R918 Other nonspecific abnormal finding of lung field: Secondary | ICD-10-CM | POA: Diagnosis not present

## 2023-05-14 DIAGNOSIS — J9600 Acute respiratory failure, unspecified whether with hypoxia or hypercapnia: Secondary | ICD-10-CM | POA: Diagnosis not present

## 2023-05-14 DIAGNOSIS — J984 Other disorders of lung: Secondary | ICD-10-CM | POA: Diagnosis not present

## 2023-05-14 LAB — CBC WITH DIFFERENTIAL/PLATELET
Abs Immature Granulocytes: 0.39 10*3/uL — ABNORMAL HIGH (ref 0.00–0.07)
Basophils Absolute: 0 10*3/uL (ref 0.0–0.1)
Basophils Relative: 0 %
Eosinophils Absolute: 0 10*3/uL (ref 0.0–0.5)
Eosinophils Relative: 0 %
HCT: 25.9 % — ABNORMAL LOW (ref 39.0–52.0)
Hemoglobin: 8.3 g/dL — ABNORMAL LOW (ref 13.0–17.0)
Immature Granulocytes: 2 %
Lymphocytes Relative: 2 %
Lymphs Abs: 0.5 10*3/uL — ABNORMAL LOW (ref 0.7–4.0)
MCH: 29.3 pg (ref 26.0–34.0)
MCHC: 32 g/dL (ref 30.0–36.0)
MCV: 91.5 fL (ref 80.0–100.0)
Monocytes Absolute: 0.4 10*3/uL (ref 0.1–1.0)
Monocytes Relative: 2 %
Neutro Abs: 19.9 10*3/uL — ABNORMAL HIGH (ref 1.7–7.7)
Neutrophils Relative %: 94 %
Platelets: 155 10*3/uL (ref 150–400)
RBC: 2.83 MIL/uL — ABNORMAL LOW (ref 4.22–5.81)
RDW: 23.9 % — ABNORMAL HIGH (ref 11.5–15.5)
Smear Review: NORMAL
WBC: 21.3 10*3/uL — ABNORMAL HIGH (ref 4.0–10.5)
nRBC: 0.3 % — ABNORMAL HIGH (ref 0.0–0.2)

## 2023-05-14 LAB — CBC
HCT: 21.9 % — ABNORMAL LOW (ref 39.0–52.0)
Hemoglobin: 6.6 g/dL — CL (ref 13.0–17.0)
MCH: 29.7 pg (ref 26.0–34.0)
MCHC: 30.1 g/dL (ref 30.0–36.0)
MCV: 98.6 fL (ref 80.0–100.0)
Platelets: 223 10*3/uL (ref 150–400)
RBC: 2.22 MIL/uL — ABNORMAL LOW (ref 4.22–5.81)
RDW: 21.1 % — ABNORMAL HIGH (ref 11.5–15.5)
WBC: 25.3 10*3/uL — ABNORMAL HIGH (ref 4.0–10.5)
nRBC: 0.3 % — ABNORMAL HIGH (ref 0.0–0.2)

## 2023-05-14 LAB — ALBUMIN, PLEURAL OR PERITONEAL FLUID: Albumin, Fluid: <1.5 g/dL

## 2023-05-14 LAB — BASIC METABOLIC PANEL WITH GFR
Anion gap: 11 (ref 5–15)
BUN: 15 mg/dL (ref 8–23)
CO2: 20 mmol/L — ABNORMAL LOW (ref 22–32)
Calcium: 7.6 mg/dL — ABNORMAL LOW (ref 8.9–10.3)
Chloride: 101 mmol/L (ref 98–111)
Creatinine, Ser: 1.04 mg/dL (ref 0.61–1.24)
GFR, Estimated: >60 mL/min (ref >60)
Glucose, Bld: 286 mg/dL — ABNORMAL HIGH (ref 70–99)
Potassium: 4.1 mmol/L (ref 3.5–5.1)
Sodium: 132 mmol/L — ABNORMAL LOW (ref 135–145)

## 2023-05-14 LAB — LACTATE DEHYDROGENASE: LDH: 634 U/L — ABNORMAL HIGH (ref 98–192)

## 2023-05-14 LAB — GLUCOSE, CAPILLARY
Glucose-Capillary: 100 mg/dL — ABNORMAL HIGH (ref 70–99)
Glucose-Capillary: 155 mg/dL — ABNORMAL HIGH (ref 70–99)
Glucose-Capillary: 168 mg/dL — ABNORMAL HIGH (ref 70–99)
Glucose-Capillary: 232 mg/dL — ABNORMAL HIGH (ref 70–99)
Glucose-Capillary: 240 mg/dL — ABNORMAL HIGH (ref 70–99)
Glucose-Capillary: 86 mg/dL (ref 70–99)

## 2023-05-14 LAB — PROTEIN, PLEURAL OR PERITONEAL FLUID: Total protein, fluid: <3 g/dL

## 2023-05-14 LAB — HEPARIN LEVEL (UNFRACTIONATED): Heparin Unfractionated: >1.1 [IU]/mL — ABNORMAL HIGH (ref 0.30–0.70)

## 2023-05-14 LAB — BODY FLUID CELL COUNT WITH DIFFERENTIAL
Eos, Fluid: 0 %
Lymphs, Fluid: 12 %
Monocyte-Macrophage-Serous Fluid: 7 % — ABNORMAL LOW (ref 50–90)
Neutrophil Count, Fluid: 81 % — ABNORMAL HIGH (ref 0–25)
Total Nucleated Cell Count, Fluid: 666 uL (ref 0–1000)

## 2023-05-14 LAB — LACTATE DEHYDROGENASE, PLEURAL OR PERITONEAL FLUID: LD, Fluid: 192 U/L — ABNORMAL HIGH (ref 3–23)

## 2023-05-14 LAB — ALBUMIN: Albumin: 3.1 g/dL — ABNORMAL LOW (ref 3.5–5.0)

## 2023-05-14 LAB — APTT
aPTT: 33 s (ref 24–36)
aPTT: 64 s — ABNORMAL HIGH (ref 24–36)

## 2023-05-14 LAB — PREPARE RBC (CROSSMATCH)

## 2023-05-14 LAB — TROPONIN I (HIGH SENSITIVITY)
Troponin I (High Sensitivity): 94 ng/L — ABNORMAL HIGH (ref <18)
Troponin I (High Sensitivity): 122 ng/L (ref <18)

## 2023-05-14 LAB — PROTEIN, TOTAL: Total Protein: 5.1 g/dL — ABNORMAL LOW (ref 6.5–8.1)

## 2023-05-14 MED ORDER — HEPARIN (PORCINE) 25000 UT/250ML-% IV SOLN
900.0000 [IU]/h | INTRAVENOUS | Status: AC
  Administered 2023-05-15: 900 [IU]/h via INTRAVENOUS
  Filled 2023-05-14: qty 250

## 2023-05-14 MED ORDER — GUAIFENESIN 100 MG/5ML PO LIQD: 5.0000 mL | ORAL | Status: DC | PRN

## 2023-05-14 MED ORDER — INSULIN ASPART 100 UNIT/ML IJ SOLN
0.0000 [IU] | INTRAMUSCULAR | Status: DC
  Administered 2023-05-14: 3 [IU] via SUBCUTANEOUS

## 2023-05-14 MED ORDER — HEPARIN (PORCINE) 25000 UT/250ML-% IV SOLN: 900.0000 [IU]/h | INTRAVENOUS | Status: DC

## 2023-05-14 MED ORDER — SODIUM CHLORIDE 0.9% IV SOLUTION: Freq: Once | INTRAVENOUS | Status: DC

## 2023-05-14 MED ORDER — FUROSEMIDE 10 MG/ML IJ SOLN
40.0000 mg | Freq: Once | INTRAMUSCULAR | Status: AC
  Administered 2023-05-14: 40 mg via INTRAVENOUS
  Filled 2023-05-14: qty 4

## 2023-05-14 MED ORDER — ADULT MULTIVITAMIN W/MINERALS CH
1.0000 | ORAL_TABLET | Freq: Every day | ORAL | Status: DC
  Administered 2023-05-15 – 2023-05-16 (×2): 1 via ORAL
  Filled 2023-05-14 (×2): qty 1

## 2023-05-14 NOTE — Progress Notes (Signed)
  Daily Progress Note    Subjective: No complaints, comfortable on Bipap  Objective: Vitals:   05/14/23 0735 05/14/23 0812  BP:    Pulse: 84   Resp: (!) 28   Temp:  (!) 96.6 F (35.9 C)  SpO2: 100%     Physical Examination Unstable on Levo On Bipap Foot wound dressings c/d/i  ASSESSMENT/PLAN:  This is a 83 y.o. male with hx of angiogram with left SFA stenting on 02/14/2023 by Dr. Susi Eric. 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 Susi Eric.  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 Susi Eric.   He now has some new shallow ulcerations on his feet. These are not infected and appears that his current illness is likely more related to a pulmonary source.  His noninvasive testing was encouraging with a widely patent stent on the left and patent tibials.  His toe pressures were noted to be severely diminished although he was on high dose levo at that time.  Will plan to follow-up once out of ICU to determine any need for repeat intervention.  Philipp Brawn MD [facility] [phone] 05/14/2023  8:17 AM

## 2023-05-14 NOTE — Procedures (Signed)
 Thoracentesis  Procedure Note  Donald Rios  [id number]  [birth year]  Date:05/14/23  Time:4:54 PM   Provider Performing:Kamelia Lampkins B Demya Scruggs   Procedure: Thoracentesis with imaging guidance (64403)  Indication(s) Pleural Effusion  Consent Risks of the procedure as well as the alternatives and risks of each were explained to the patient and/or caregiver.  Consent for the procedure was obtained and is signed in the bedside chart  Anesthesia Topical only with 1% lidocaine     Time Out Verified patient identification, verified procedure, site/side was marked, verified correct patient position, special equipment/implants available, medications/allergies/relevant history reviewed, required imaging and test results available.   Sterile Technique Maximal sterile technique including full sterile barrier drape, hand hygiene, sterile gown, sterile gloves, mask, hair covering, sterile ultrasound probe cover (if used).  Procedure Description Ultrasound was used to identify appropriate pleural anatomy for placement and overlying skin marked.  Area of drainage cleaned and draped in sterile fashion. Lidocaine  was used to anesthetize the skin and subcutaneous tissue.  1,100 cc's of cloudy amber appearing fluid was drained from the left pleural space. Catheter then removed and bandaid applied to site.   Complications/Tolerance None; patient tolerated the procedure well. Chest X-ray is ordered to confirm no post-procedural complication.   EBL Minimal   Specimen(s) Pleural fluid

## 2023-05-14 NOTE — Plan of Care (Signed)
  Problem: Nutritional: Goal: Progress toward achieving an optimal weight will improve Outcome: Progressing   Problem: Skin Integrity: Goal: Risk for impaired skin integrity will decrease Outcome: Progressing   Problem: Tissue Perfusion: Goal: Adequacy of tissue perfusion will improve Outcome: Progressing   Problem: Fluid Volume: Goal: Hemodynamic stability will improve Outcome: Progressing   Problem: Clinical Measurements: Goal: Diagnostic test results will improve Outcome: Progressing Goal: Signs and symptoms of infection will decrease Outcome: Progressing   Problem: Clinical Measurements: Goal: Will remain free from infection Outcome: Progressing Goal: Diagnostic test results will improve Outcome: Progressing Goal: Respiratory complications will improve Outcome: Progressing Goal: Cardiovascular complication will be avoided Outcome: Progressing   Problem: Nutrition: Goal: Adequate nutrition will be maintained Outcome: Progressing   Problem: Coping: Goal: Level of anxiety will decrease Outcome: Progressing   Problem: Elimination: Goal: Will not experience complications related to bowel motility Outcome: Progressing Goal: Will not experience complications related to urinary retention Outcome: Progressing   Problem: Pain Managment: Goal: General experience of comfort will improve and/or be controlled Outcome: Progressing   Problem: Safety: Goal: Ability to remain free from injury will improve Outcome: Progressing   Problem: Skin Integrity: Goal: Risk for impaired skin integrity will decrease Outcome: Progressing   Problem: Education: Goal: Ability to describe self-care measures that may prevent or decrease complications (Diabetes Survival Skills Education) will improve Outcome: Not Progressing Goal: Individualized Educational Video(s) Outcome: Not Progressing   Problem: Coping: Goal: Ability to adjust to condition or change in health will  improve Outcome: Not Progressing   Problem: Fluid Volume: Goal: Ability to maintain a balanced intake and output will improve Outcome: Not Progressing   Problem: Health Behavior/Discharge Planning: Goal: Ability to identify and utilize available resources and services will improve Outcome: Not Progressing Goal: Ability to manage health-related needs will improve Outcome: Not Progressing   Problem: Metabolic: Goal: Ability to maintain appropriate glucose levels will improve Outcome: Not Progressing   Problem: Nutritional: Goal: Maintenance of adequate nutrition will improve Outcome: Not Progressing   Problem: Respiratory: Goal: Ability to maintain adequate ventilation will improve Outcome: Not Progressing   Problem: Education: Goal: Knowledge of General Education information will improve Description: Including pain rating scale, medication(s)/side effects and non-pharmacologic comfort measures Outcome: Not Progressing   Problem: Health Behavior/Discharge Planning: Goal: Ability to manage health-related needs will improve Outcome: Not Progressing   Problem: Clinical Measurements: Goal: Ability to maintain clinical measurements within normal limits will improve Outcome: Not Progressing   Problem: Activity: Goal: Risk for activity intolerance will decrease Outcome: Not Progressing

## 2023-05-14 NOTE — Progress Notes (Signed)
 PHARMACY - ANTICOAGULATION CONSULT NOTE  Pharmacy Consult for heparin  Indication: atrial fibrillation  No Known Allergies  Patient Measurements: Height: 5\' 8"  (172.7 cm) Weight: 58.1 kg (128 lb 1.4 oz) IBW/kg (Calculated) : 68.4 HEPARIN  DW (KG): 64  Vital Signs: Temp: 96.6 F (35.9 C) (05/03 0812) Temp Source: Axillary (05/03 0812) BP: 124/60 (05/03 0730) Pulse Rate: 90 (05/03 1015)  Labs: Recent Labs    05/12/23 0327 05/12/23 0530 05/12/23 0756 05/12/23 0756 05/12/23 1355 05/12/23 2334 05/13/23 0450 05/13/23 0602 05/13/23 2346 05/14/23 0147 05/14/23 0340 05/14/23 1121  HGB 7.7*  --  8.2*  --   --   --  7.0*  --   --  6.6*  --   --   HCT 25.5*  --  24.0*  --   --   --  22.6*  --   --  21.9*  --   --   PLT 214  --   --   --   --   --  236  --   --  223  --   --   APTT  --   --   --    < >  --  >200* 180*  --  33  --   --  64*  HEPARINUNFRC  --   --   --   --   --  >1.10*  --  >1.10*  --   --   --  >1.10*  CREATININE 0.90  --   --   --  1.10  --  1.02  --   --  1.04  --   --   TROPONINIHS 81* 39*  --   --   --   --   --   --   --  94* 122*  --    < > = values in this interval not displayed.    Estimated Creatinine Clearance: 45.8 mL/min (by C-G formula based on SCr of 1.04 mg/dL).   Assessment: 25 YOM who presented with with lethargy and hypoglycemia. He became hypoxic and CXR was positive for L hemithroax opacification. Concern for possible pneumonia and osteomyelitis of foot was also raised. He has a history of atrial fibrillation for which he takes Eliquis . Last dose was on 4/30 at 2100. Due to potential for procedures, pharmacy has been consulted to dose heparin .   Heparin  level and aPTT were drawn ~15-20 min after heparin  was stopped for thoracentesis.  Expect true aPTT to be therapeutic (heparin  level falsely elevated as expected).  Hemoglobin dropped and patient is being transfused; no bleeding reported.  Goal of Therapy:  Heparin  level 0.3-0.7  units/ml aPTT 66-102 seconds Monitor platelets by anticoagulation protocol: Yes   Plan:  When ready to resume IV heparin , resume at 900 units/hr Check 8 hr aPTT Daily heparin  level, aPTT and CBC Monitor for s/sx of bleeding  Ader Fritze D. Marikay Show, PharmD, BCPS, BCCCP 05/14/2023, 2:39 PM

## 2023-05-14 NOTE — Progress Notes (Signed)
 PHARMACY - ANTICOAGULATION CONSULT NOTE  Pharmacy Consult for heparin  Indication: atrial fibrillation  No Known Allergies  Patient Measurements: Height: 5\' 8"  (172.7 cm) Weight: 58.1 kg (128 lb 1.4 oz) IBW/kg (Calculated) : 68.4 HEPARIN  DW (KG): 64  Vital Signs: Temp: 97.4 F (36.3 C) (05/03 1555) Temp Source: Oral (05/03 1555) BP: 107/76 (05/03 1800) Pulse Rate: 95 (05/03 1800)  Labs: Recent Labs    05/12/23 0327 05/12/23 0530 05/12/23 0756 05/12/23 0756 05/12/23 1355 05/12/23 2334 05/13/23 0450 05/13/23 0602 05/13/23 2346 05/14/23 0147 05/14/23 0340 05/14/23 1121  HGB 7.7*  --  8.2*  --   --   --  7.0*  --   --  6.6*  --   --   HCT 25.5*  --  24.0*  --   --   --  22.6*  --   --  21.9*  --   --   PLT 214  --   --   --   --   --  236  --   --  223  --   --   APTT  --   --   --    < >  --  >200* 180*  --  33  --   --  64*  HEPARINUNFRC  --   --   --   --   --  >1.10*  --  >1.10*  --   --   --  >1.10*  CREATININE 0.90  --   --   --  1.10  --  1.02  --   --  1.04  --   --   TROPONINIHS 81* 39*  --   --   --   --   --   --   --  94* 122*  --    < > = values in this interval not displayed.    Estimated Creatinine Clearance: 45.8 mL/min (by C-G formula based on SCr of 1.04 mg/dL).   Assessment: 37 YOM who presented with with lethargy and hypoglycemia. He became hypoxic and CXR was positive for L hemithroax opacification. Concern for possible pneumonia and osteomyelitis of foot was also raised. He has a history of atrial fibrillation for which he takes Eliquis . Last dose was on 4/30 at 2100. Due to potential for procedures, pharmacy has been consulted to dose heparin .   Heparin  level and aPTT were drawn ~15-20 min after heparin  was stopped for thoracentesis.  Expect true aPTT to be therapeutic (heparin  level falsely elevated as expected).  Hemoglobin dropped and patient is being transfused; no bleeding reported.   5/3 PM - heparin  resumed ~ 1 hour post intervention at  previous therapeutic rate. Will get aPTT/ heparin  level in the morning with AM labs.   Goal of Therapy:  Heparin  level 0.3-0.7 units/ml aPTT 66-102 seconds Monitor platelets by anticoagulation protocol: Yes   Plan:  Resume IV heparin , resume at 900 units/hr Check 8 hr aPTT Daily heparin  level, aPTT and CBC Monitor for s/sx of bleeding  Mohammed Andrew, PharmD Clinical Pharmacist 05/14/2023 6:37 PM Please check AMION for all [facility] Pharmacy numbers

## 2023-05-14 NOTE — Progress Notes (Signed)
 PHARMACY - ANTICOAGULATION CONSULT NOTE  Pharmacy Consult for heparin  Indication: atrial fibrillation  Labs: Recent Labs    05/12/23 0327 05/12/23 0530 05/12/23 0756 05/12/23 1355 05/12/23 2334 05/13/23 0450 05/13/23 0602 05/13/23 2346  HGB 7.7*  --  8.2*  --   --  7.0*  --   --   HCT 25.5*  --  24.0*  --   --  22.6*  --   --   PLT 214  --   --   --   --  236  --   --   APTT  --   --   --   --  >200* 180*  --  33  HEPARINUNFRC  --   --   --   --  >1.10*  --  >1.10*  --   CREATININE 0.90  --   --  1.10  --  1.02  --   --   TROPONINIHS 81* 39*  --   --   --   --   --   --    Assessment: 83yo male now subtherapeutic on heparin  after resuming; no infusion issues per RN but he does note that multiple wounds have been oozing (no worse than previous).  Heparin  has been moved back to PICC but phlebotomy was able to collect labs peripherally, so results should be more accurate than previously.  Goal of Therapy:  aPTT 66-102 seconds   Plan:  Increase heparin  infusion by 3-4 units/kg/hr to 850 units/hr. Check PTT in 8 hours.   Lonnie Roberts, PharmD, BCPS 05/14/2023 12:56 AM

## 2023-05-14 NOTE — Progress Notes (Signed)
 NAME:  Donald Rios, MRN:  [id number], DOB:  May 11, 1940, LOS: 2 ADMISSION DATE:  05/12/2023, CONSULTATION DATE: 5/1 REFERRING MD: Dr. Nichole Barker, CHIEF COMPLAINT: Hypotension  History of Present Illness:  83 year old male with past medical history as below, which is significant for CAD status post CABG, COPD, diabetes mellitus, ischemic cardiomyopathy with chronic HFrEF, and atrial fibrillation on Eliquis .  He was recently admitted 4/1 to [facility] health for sepsis secondary to left lower extremity cellulitis.  Course was complicated by hypotension which was felt to be secondary to hypovolemia, sepsis, or adrenal insufficiency.  He was started on midodrine  and fludrocortisone  with improvement.  He was treated for cellulitis with antibiotics and was discharged to SNF.  He has since been discharged from SNF to home.  He was in his usual state of health until approximately 4/30 when he became lethargic around 2 PM.  EMS was called and he was found to be hypoglycemic to 60 which improved with dextrose  administration.  He was transported to [facility] emergency department for further evaluation.  Initially upon arrival he was minimally responsive, but improved over the course of his stay in the ED.  He was hypoxemic to the mid 80s on room air and hypotensive.  Imaging of the chest was concerning for L hemithorax opacification and CT was done showing moderal left pleural effusion and atelectasis vs pneumonia as well as a right sided pneumonia. He was placed on BiPAP for hypoxia and started on empiric antibiotics. He was admitted to the hospitalists. Despite BiPAP he remained somnolent. And hypotension persisted as well. PCCM was asked to evaluate for ICU transfer.   Pertinent  Medical History   has a past medical history of Acute gastric ulcer with hemorrhage (05/06/2013), Arthritis, Asthma, Atrial enlargement, left, 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, Cataract, COPD (chronic obstructive pulmonary disease)  (HCC), Diabetes mellitus, Diabetic nephropathy (HCC), Diverticulosis, ED (erectile dysfunction), GERD (gastroesophageal reflux disease), Gout, Hearing loss, Hiatal hernia (05/2008), History of MI (myocardial infarction), History of nuclear stress test (08/07/2010), Hyperlipidemia, Hyperplastic colon polyp (05/2008), Hypertension, Ischemic cardiomyopathy, Myocardial infarction (HCC), and Valvular regurgitation.   Significant Hospital Events: Including procedures, antibiotic start and stop dates in addition to other pertinent events   5/2 admitted for respiratory failure and pneumonia, shock 5/3 remained on levophed , O2 requirements increased overnight - placed on Bipap  Interim History / Subjective:   Required bipap overnight CXR with increasing infiltrates Hemoglobin less than 7, 1 unit PRBCs ordered  Objective   Blood pressure 124/60, pulse 84, temperature (!) 96.6 F (35.9 C), temperature source Axillary, resp. rate (!) 28, height 5\' 8"  (1.727 m), weight 58.1 kg, SpO2 100%.    FiO2 (%):  [60 %] 60 %   Intake/Output Summary (Last 24 hours) at 05/14/2023 0839 Last data filed at 05/14/2023 0730 Gross per 24 hour  Intake 1882.62 ml  Output 1300 ml  Net 582.62 ml   Filed Weights   05/12/23 0403 05/12/23 1052 05/13/23 0511  Weight: 64 kg 58 kg 58.1 kg   Examination: General: Elderly, chronically ill appearing male, mild respiratory distress HENT: [place]/AT, PERRL, no JVD Lungs: course breath sounds, crackles present  Cardiovascular: irrgularly irregular rate. No MRG Abdomen: Soft, NT, ND Extremities: Multiple ulcerations on left foot and well and R lateral lower extremity pictured below. 2+ pitting edema to bilateral lower extremities.  Neuro: Alert, oriented, non-focal  Resolved Hospital Problem list     Assessment & Plan:   Acute respiratory failure: multifocal pneumonia +  pleural effusion. 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 - Appreciate vascular surgery consult - WOC consult  Chronic HFrEF: LVEF 25% secondary to ischemic cardiomyopathy - hold home metoprolol  and spironolactone  for now - check trop - give lasix  40mg  IV  Atrial fibrillation - Holding Eliquis  in anticipation of possible surgery - Amiodarone  continue - IV heparin   Hypokalemia - Replace K - Trend lytes  Normocytic anemia - Hemoglobin at his recent baseline - trend CBC - Transfuse for hemoglobin < 7  DM - CBG monitoring and SSI   Best Practice (right click and "Reselect all SmartList Selections" daily)   Diet/type: dysphagia diet (see orders) DVT prophylaxis systemic heparin   Pressure ulcer(s): identified on: 01/May/2025 sacral and foot present on admission GI prophylaxis: N/A Lines: Central line Foley:  N/A Code Status:  full code Last date of multidisciplinary goals of care discussion [ ]   Labs   CBC: Recent Labs  Lab 05/12/23 0327 05/12/23 0756 05/13/23 0450 05/14/23 0147  WBC 9.1  --  36.2* 25.3*  NEUTROABS 7.9*  --   --   --   HGB 7.7* 8.2* 7.0* 6.6*  HCT 25.5* 24.0* 22.6* 21.9*  MCV 98.1  --  96.6 98.6  PLT 214  --  236 223    Basic Metabolic Panel: Recent Labs  Lab 05/12/23 0327 05/12/23 0530 05/12/23 0756 05/12/23 1355 05/13/23 0450 05/14/23 0147  NA 139  --  135 136 128* 132*  K 2.0*  --  2.7* 3.2* 3.5 4.1  CL 101  --   --  102 98 101  CO2 30  --   --  23 20* 20*  GLUCOSE 118*  --   --  163* 420* 286*  BUN 8  --   --  9 11 15   CREATININE 0.90  --   --  1.10 1.02 1.04  CALCIUM   7.2*  --   --  7.2* 6.7* 7.6*  MG  --  0.8*  --  3.1* 2.3  --   PHOS  --   --   --  2.2* 2.8  --    GFR: Estimated Creatinine Clearance: 45.8 mL/min (by C-G formula based on SCr of 1.04 mg/dL). Recent Labs  Lab 05/12/23 0327 05/12/23 0334 05/12/23 0530 05/12/23 0541 05/13/23 0450 05/14/23 0147  PROCALCITON  --   --  <0.10  --   --   --   WBC 9.1  --   --   --  36.2* 25.3*  LATICACIDVEN  --  1.4  --  0.6  --   --     Liver Function Tests: Recent Labs  Lab 05/12/23 0327 05/12/23 1355  AST 24 32  ALT 13 13  ALKPHOS 81 75  BILITOT 1.1 0.9  PROT 4.0* 4.0*  ALBUMIN  1.7* 1.6*   No results for input(s): "LIPASE", "AMYLASE" in the last 168 hours. Recent Labs  Lab 05/12/23 1355  AMMONIA 13    ABG    Component Value Date/Time   PHART 7.398 05/12/2023 0756  PCO2ART 39.5 05/12/2023 0756   PO2ART 85 05/12/2023 0756   HCO3 24.4 05/12/2023 0756   TCO2 26 05/12/2023 0756   O2SAT 96 05/12/2023 0756     Coagulation Profile: No results for input(s): "INR", "PROTIME" in the last 168 hours.  Cardiac Enzymes: No results for input(s): "CKTOTAL", "CKMB", "CKMBINDEX", "TROPONINI" in the last 168 hours.  HbA1C: Hgb A1c MFr Bld  Date/Time Value Ref Range Status  05/13/2023 05:00 AM 5.0 4.8 - 5.6 % Final    Comment:    (NOTE) Pre diabetes:          5.7%-6.4%  Diabetes:              >6.4%  Glycemic control for   <7.0% adults with diabetes   02/08/2023 10:52 PM 6.2 (H) 4.8 - 5.6 % Final    Comment:    (NOTE) Pre diabetes:          5.7%-6.4%  Diabetes:              >6.4%  Glycemic control for   <7.0% adults with diabetes     CBG: Recent Labs  Lab 05/13/23 1521 05/13/23 2043 05/14/23 0021 05/14/23 0352 05/14/23 0810  GLUCAP 262* 209* 240* 232* 168*    Critical care time: 35 mins     Duaine German, MD [place] [facility] Office: [phone]   See Amion for personal pager PCCM on call pager [phone] until 7pm. Please call  Elink 7p-7a. [phone]

## 2023-05-14 NOTE — Progress Notes (Addendum)
 eLink Physician-Brief Progress Note Patient Name: DANIELS THOMA DOB: 1940/08/16 MRN: [id number]   Date of Service  05/14/2023  HPI/Events of Note  c/o of RUQ pain 4/10, difficulty breathing, labored.  Monitor alarming STE. anticipating thoracentesis tomorrow  eICU Interventions  Patient is mildly distressed on observation.  Has a BiPAP in the room which may be beneficial overnight for his increased work of breathing.  If he is intolerant of the BiPAP, would switch to heated high flow rather than salter cannula for support.  Will review pending EKG, but alarms review shows that he has been alarming ST elevation for more than 24 hours.  Low suspicion for ACS at this time.   0139 -ECG reviewed with similar but more pronounced appearance compared to last with repolarization abnormalities and incomplete right bundle branch mimicking MI.  Will send for troponins, but suspect this is unrelated.  Already heparinized.  4098 -critical hemoglobin 6.6.  Bruising throughout from his wounds.  Heparinized.  Transfuse 1 unit PRBC.  Intervention Category Major Interventions: Arrhythmia - evaluation and management  Boleslaus Holloway 05/14/2023, 1:28 AM

## 2023-05-14 NOTE — Consult Note (Signed)
 Palliative Care Consult Note                                  Date: 05/14/2023   Patient Name: Donald Rios  DOB: 27-Nov-1940  MRN: [id number]  Age / Sex: 83 y.o., male  PCP: Austine Lefort, MD Referring Physician: Wilfredo Hanly, MD  Reason for Consultation: Establishing goals of care  HPI/Patient Profile: 83 y.o. male  with past medical history of CAD status post CABG, COPD, diabetes mellitus, ischemic cardiomyopathy with chronic HFrEF, and atrial fibrillation on Eliquis  admitted on 05/12/2023 with lethargy.   CT Chest concerning for L hemithorax opacification and patchy nodular airspace opacity in the posterior RUL and scattered throughout the RLL with dense consolidative airspace disease through the RLL, concerning for multifocal PNA.   He was recently admitted 04/12/23 to [facility] for sepsis secondary to left lower extremity cellulitis. Course was complicated by hypotension which was felt to be secondary to hypovolemia, sepsis, or adrenal insufficiency. He was started on midodrine  and fludrocortisone  with improvement. He was treated for cellulitis with antibiotics and was discharged to SNF. He has since been discharged from SNF to home   Past Medical History:  Diagnosis Date   Acute gastric ulcer with hemorrhage 05/06/2013   Arthritis    Asthma    Atrial enlargement, left    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    History of MI (myocardial infarction)    History of nuclear stress test 08/07/2010   dipyridamole; EKG negative for ischemia, low risk scan    Hyperlipidemia    Hyperplastic colon polyp 05/2008   Hypertension     Ischemic cardiomyopathy    EF 45%, with inferior wall motion abnormality    Myocardial infarction (HCC)    Valvular regurgitation    mitral and tricuspid (mild)    Subjective:   I have reviewed medical records including EPIC notes, labs and imaging, received updates from nursing and attending MD, assessed the patient and then met with the patient and his wife Jakwon Hacke to discuss diagnosis prognosis, GOC, EOL wishes, disposition and options.  Today's Discussion: Patient sitting up in bed in NAD. No family at bedside. Introduced Palliative Medicine as specialized medical care for people living with serious illness. It focuses on providing relief from symptoms and stress of a serious illness. The goal is to improve quality of life for both the patient and the family. Patient faces treatment option decisions, advanced directive decisions, and anticipatory care needs. Patient would like his wife to be present for goals of care conversations. Called his wife Devra Fontana. We will meet at 2:30 pm.  3:20 pm: Met with patient and wife. Patient is on BiPAP for much of the conversation and defers to his wife to answer questions or responds with yes/no. At the end of our conversation he asks to be taken off BiPAP to be able to share his thoughts.  Patients wife Devra Fontana shared her understanding of the patient's chronic conditions and acute hospitalization. We discuss the patient's frequent rehospitalizations  over the last six months. We discuss his April 2025 with sepsis. After his hospitalization he went to SNF for 20 days was discharged home and then almost immediately was readmitted. They share that he has had no energy or appetite during this time. We discussed his failure to thrive and lack of reserve.  The patient is married and has a daughter from a previous marriage. 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 He has always enjoyed being outside and tinkering with tractors and mowing. Due to  his failure to thrive and chronic conditions he is unable to do these tasks. He enjoys watching NASCAR.  A discussion was had today regarding advanced directives. We discussed code status. Recommended consideration of DNR status, understanding evidenced-based poor outcomes in similar hospitalized patients, as the cause of the arrest is likely associated with chronic/terminal disease rather than a reversible acute cardio-pulmonary event. He would like to remain FULL code.  We discussed scopes of care. We discussed the difference between an aggressive medical intervention path and a palliative comfort care path. The patient shared he wants to remain FULL scope of care.  I encouraged him to consider if there was a time when he would not want aggressive medical interventions and he replied if he were in pain or if he was "not good." However he could not articulate what "not good" meant to him. Encouraged patient and his wife to continue conversations around goals of care.  Discussed the importance of continued conversation with family and the medical providers regarding overall plan of care and treatment options, ensuring decisions are within the context of the patient's values and GOCs.  Questions and concerns were addressed. Hard Choices booklet left for review. The family was encouraged to call with questions or concerns. PMT will continue to support holistically.  Review of Systems  Constitutional:  Positive for fatigue.  Respiratory:  Positive for shortness of breath.     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  Constitutional:      General: He is not in acute distress.    Appearance: He is ill-appearing.  HENT:     Head: Normocephalic and atraumatic.  Cardiovascular:     Rate and Rhythm: Normal rate.  Pulmonary:     Effort: Tachypnea present.  Neurological:     Mental Status: He is alert.     Vital Signs:  BP 124/60   Pulse 90   Temp (!) 96.6 F (35.9 C) (Axillary)   Resp (!) 24   Ht 5\' 8"  (1.727 m)   Wt 58.1 kg   SpO2 100%   BMI 19.48 kg/m     Advanced Care Planning:   Existing Vynca/ACP Documentation: None  Primary Decision Maker: PATIENT  Code Status/Advance Care Planning: Full code    Assessment & Plan:   SUMMARY OF RECOMMENDATIONS   Full code  Full scope of care Encouraged patient and spouse to continue conversations around goals of care  PMT to support  Discussed with: bedside RN and Dr. Diania Fortes  Time Total: 75 minutes    Thank you for allowing us  to participate in the care of MACKLAN GUAGLIARDO PMT will continue to support holistically.   Signed by: Joaquim Muir, NP Palliative Medicine Team  Team Phone # [phone] (Nights/Weekends)  05/14/2023, 11:38 AM

## 2023-05-15 DIAGNOSIS — A419 Sepsis, unspecified organism: Secondary | ICD-10-CM | POA: Diagnosis not present

## 2023-05-15 DIAGNOSIS — J9601 Acute respiratory failure with hypoxia: Secondary | ICD-10-CM | POA: Diagnosis not present

## 2023-05-15 DIAGNOSIS — Z7189 Other specified counseling: Secondary | ICD-10-CM | POA: Diagnosis not present

## 2023-05-15 DIAGNOSIS — J9600 Acute respiratory failure, unspecified whether with hypoxia or hypercapnia: Secondary | ICD-10-CM | POA: Diagnosis not present

## 2023-05-15 DIAGNOSIS — J189 Pneumonia, unspecified organism: Secondary | ICD-10-CM | POA: Diagnosis not present

## 2023-05-15 DIAGNOSIS — Z515 Encounter for palliative care: Secondary | ICD-10-CM | POA: Diagnosis not present

## 2023-05-15 DIAGNOSIS — E876 Hypokalemia: Secondary | ICD-10-CM | POA: Diagnosis not present

## 2023-05-15 DIAGNOSIS — J90 Pleural effusion, not elsewhere classified: Secondary | ICD-10-CM | POA: Diagnosis not present

## 2023-05-15 DIAGNOSIS — I4891 Unspecified atrial fibrillation: Secondary | ICD-10-CM | POA: Diagnosis not present

## 2023-05-15 LAB — CBC
HCT: 25.6 % — ABNORMAL LOW (ref 39.0–52.0)
Hemoglobin: 8.2 g/dL — ABNORMAL LOW (ref 13.0–17.0)
MCH: 29 pg (ref 26.0–34.0)
MCHC: 32 g/dL (ref 30.0–36.0)
MCV: 90.5 fL (ref 80.0–100.0)
Platelets: 125 10*3/uL — ABNORMAL LOW (ref 150–400)
RBC: 2.83 MIL/uL — ABNORMAL LOW (ref 4.22–5.81)
RDW: 23.7 % — ABNORMAL HIGH (ref 11.5–15.5)
WBC: 17.8 10*3/uL — ABNORMAL HIGH (ref 4.0–10.5)
nRBC: 0.2 % (ref 0.0–0.2)

## 2023-05-15 LAB — GLUCOSE, CAPILLARY
Glucose-Capillary: 108 mg/dL — ABNORMAL HIGH (ref 70–99)
Glucose-Capillary: 116 mg/dL — ABNORMAL HIGH (ref 70–99)
Glucose-Capillary: 119 mg/dL — ABNORMAL HIGH (ref 70–99)
Glucose-Capillary: 120 mg/dL — ABNORMAL HIGH (ref 70–99)
Glucose-Capillary: 138 mg/dL — ABNORMAL HIGH (ref 70–99)
Glucose-Capillary: 81 mg/dL (ref 70–99)

## 2023-05-15 LAB — BPAM RBC
Blood Product Expiration Date: 202505272359
ISSUE DATE / TIME: 202505030452
Unit Type and Rh: 6200

## 2023-05-15 LAB — CULTURE, RESPIRATORY W GRAM STAIN

## 2023-05-15 LAB — TYPE AND SCREEN
ABO/RH(D): A POS
Antibody Screen: NEGATIVE
Unit division: 0

## 2023-05-15 LAB — BASIC METABOLIC PANEL WITH GFR
Anion gap: 9 (ref 5–15)
BUN: 21 mg/dL (ref 8–23)
CO2: 24 mmol/L (ref 22–32)
Calcium: 8.2 mg/dL — ABNORMAL LOW (ref 8.9–10.3)
Chloride: 106 mmol/L (ref 98–111)
Creatinine, Ser: 1.12 mg/dL (ref 0.61–1.24)
GFR, Estimated: >60 mL/min (ref >60)
Glucose, Bld: 89 mg/dL (ref 70–99)
Potassium: 3.7 mmol/L (ref 3.5–5.1)
Sodium: 139 mmol/L (ref 135–145)

## 2023-05-15 LAB — PROCALCITONIN: Procalcitonin: 8.99 ng/mL

## 2023-05-15 LAB — MAGNESIUM: Magnesium: 2.2 mg/dL (ref 1.7–2.4)

## 2023-05-15 LAB — HEPARIN LEVEL (UNFRACTIONATED): Heparin Unfractionated: >1.1 [IU]/mL — ABNORMAL HIGH (ref 0.30–0.70)

## 2023-05-15 LAB — BRAIN NATRIURETIC PEPTIDE: B Natriuretic Peptide: 1714.3 pg/mL — ABNORMAL HIGH (ref 0.0–100.0)

## 2023-05-15 LAB — APTT: aPTT: 76 s — ABNORMAL HIGH (ref 24–36)

## 2023-05-15 MED ORDER — ENOXAPARIN SODIUM 60 MG/0.6ML IJ SOSY
60.0000 mg | PREFILLED_SYRINGE | Freq: Two times a day (BID) | INTRAMUSCULAR | Status: DC
  Administered 2023-05-15 – 2023-05-16 (×3): 60 mg via SUBCUTANEOUS
  Filled 2023-05-15 (×3): qty 0.6

## 2023-05-15 MED ORDER — INSULIN ASPART 100 UNIT/ML IJ SOLN
0.0000 [IU] | INTRAMUSCULAR | Status: DC
  Administered 2023-05-15: 1 [IU] via SUBCUTANEOUS

## 2023-05-15 MED ORDER — MEDIHONEY WOUND/BURN DRESSING EX PSTE: 1.0000 | PASTE | Freq: Every day | CUTANEOUS | Status: DC

## 2023-05-15 MED ORDER — MEDIHONEY WOUND/BURN DRESSING EX PSTE
1.0000 | PASTE | Freq: Every day | CUTANEOUS | Status: DC
  Administered 2023-05-15: 1 via TOPICAL
  Filled 2023-05-15: qty 44

## 2023-05-15 MED ORDER — POTASSIUM CHLORIDE CRYS ER 20 MEQ PO TBCR
40.0000 meq | EXTENDED_RELEASE_TABLET | Freq: Once | ORAL | Status: AC
  Administered 2023-05-15: 40 meq via ORAL
  Filled 2023-05-15: qty 2

## 2023-05-15 MED ORDER — ALBUMIN HUMAN 25 % IV SOLN
25.0000 g | Freq: Four times a day (QID) | INTRAVENOUS | Status: AC
Start: 2023-05-15 — End: 2023-05-15
  Administered 2023-05-15 (×3): 25 g via INTRAVENOUS
  Filled 2023-05-15 (×3): qty 100

## 2023-05-15 MED ORDER — FUROSEMIDE 10 MG/ML IJ SOLN
40.0000 mg | Freq: Two times a day (BID) | INTRAMUSCULAR | Status: DC
  Administered 2023-05-15 – 2023-05-16 (×3): 40 mg via INTRAVENOUS
  Filled 2023-05-15 (×3): qty 4

## 2023-05-15 NOTE — Progress Notes (Signed)
 NAME:  Donald Rios, MRN:  [id number], DOB:  January 18, 1940, LOS: 3 ADMISSION DATE:  05/12/2023, CONSULTATION DATE: 5/1 REFERRING MD: Dr. Nichole Barker, CHIEF COMPLAINT: Hypotension  History of Present Illness:  83 year old male with past medical history as below, which is significant for CAD status post CABG, COPD, diabetes mellitus, ischemic cardiomyopathy with chronic HFrEF, and atrial fibrillation on Eliquis .  He was recently admitted 4/1 to [facility] health for sepsis secondary to left lower extremity cellulitis.  Course was complicated by hypotension which was felt to be secondary to hypovolemia, sepsis, or adrenal insufficiency.  He was started on midodrine  and fludrocortisone  with improvement.  He was treated for cellulitis with antibiotics and was discharged to SNF.  He has since been discharged from SNF to home.  He was in his usual state of health until approximately 4/30 when he became lethargic around 2 PM.  EMS was called and he was found to be hypoglycemic to 60 which improved with dextrose  administration.  He was transported to [facility] emergency department for further evaluation.  Initially upon arrival he was minimally responsive, but improved over the course of his stay in the ED.  He was hypoxemic to the mid 80s on room air and hypotensive.  Imaging of the chest was concerning for L hemithorax opacification and CT was done showing moderal left pleural effusion and atelectasis vs pneumonia as well as a right sided pneumonia. He was placed on BiPAP for hypoxia and started on empiric antibiotics. He was admitted to the hospitalists. Despite BiPAP he remained somnolent. And hypotension persisted as well. PCCM was asked to evaluate for ICU transfer.   Pertinent  Medical History   has a past medical history of Acute gastric ulcer with hemorrhage (05/06/2013), Arthritis, Asthma, Atrial enlargement, left, 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, Cataract, COPD (chronic obstructive pulmonary disease)  (HCC), Diabetes mellitus, Diabetic nephropathy (HCC), Diverticulosis, ED (erectile dysfunction), GERD (gastroesophageal reflux disease), Gout, Hearing loss, Hiatal hernia (05/2008), History of MI (myocardial infarction), History of nuclear stress test (08/07/2010), Hyperlipidemia, Hyperplastic colon polyp (05/2008), Hypertension, Ischemic cardiomyopathy, Myocardial infarction (HCC), and Valvular regurgitation.   Significant Hospital Events: Including procedures, antibiotic start and stop dates in addition to other pertinent events   5/2 admitted for respiratory failure and pneumonia, shock 5/3 remained on levophed , O2 requirements increased overnight - placed on Bipap  Interim History / Subjective:   He continues to feel short of breath today, on 12-13L of O2. On bipap overnight  Objective   Blood pressure (!) 117/49, pulse 93, temperature 97.8 F (36.6 C), temperature source Axillary, resp. rate (!) 31, height 5\' 8"  (1.727 m), weight 58.1 kg, SpO2 97%.    FiO2 (%):  [50 %] 50 %   Intake/Output Summary (Last 24 hours) at 05/15/2023 0750 Last data filed at 05/15/2023 0700 Gross per 24 hour  Intake 374.18 ml  Output 1300 ml  Net -925.82 ml   Filed Weights   05/12/23 0403 05/12/23 1052 05/13/23 0511  Weight: 64 kg 58 kg 58.1 kg   Examination: General: Elderly, chronically ill appearing male, mild respiratory distress HENT: [place]/AT, PERRL, no JVD Lungs: course breath sounds, crackles present  Cardiovascular: irrgularly irregular rate. No MRG Abdomen: Soft, NT, ND Extremities: Multiple ulcerations on left foot and well and R lateral lower extremity pictured below. 1+ pitting edema to bilateral lower extremities.  Neuro: Alert, oriented, non-focal  Resolved Hospital Problem list   Shock  Assessment & Plan:   Acute respiratory failure: multifocal pneumonia +  pleural effusion. - PRN Bipap - Keep sats > 90% - Empiric HAP treatment with cefepime /vancomycin  - s/p thoracentesis 5/3 with  1.1L removed. Exudative based on LDH greater than 2/3 upper limit of normal. Neutrophil predominant. 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 Can resume eliquis  once no more procedures planned   Normocytic anemia - Hemoglobin at his recent baseline - trend CBC - Transfuse for hemoglobin < 7  DM - CBG monitoring and SSI   Best Practice (right click and "Reselect all SmartList Selections" daily)   Diet/type: dysphagia diet (see orders) DVT prophylaxis LMWH  Pressure ulcer(s): identified on: 01/May/2025 sacral and foot present on admission GI prophylaxis: N/A Lines: Central line Foley:  N/A Code Status:  full code Last date of multidisciplinary goals of care discussion [ palliative met with family yesterday, remains full code]  Labs   CBC: Recent Labs  Lab 05/12/23 0327 05/12/23 0756 05/13/23 0450 05/14/23 0147 05/14/23 1913 05/15/23 0418  WBC 9.1  --  36.2* 25.3* 21.3* 17.8*  NEUTROABS 7.9*  --   --   --  19.9*  --   HGB 7.7* 8.2* 7.0* 6.6* 8.3* 8.2*  HCT 25.5* 24.0* 22.6* 21.9* 25.9* 25.6*  MCV 98.1  --  96.6 98.6 91.5 90.5  PLT 214  --  236 223 155 125*    Basic Metabolic Panel: Recent Labs  Lab 05/12/23 0327 05/12/23 0530 05/12/23 0756 05/12/23 1355 05/13/23 0450 05/14/23 0147 05/15/23 0418  NA 139  --  135 136 128* 132* 139  K 2.0*  --  2.7* 3.2* 3.5 4.1 3.7  CL 101  --   --  102 98 101 106  CO2 30  --   --  23 20* 20* 24  GLUCOSE 118*  --   --  163* 420* 286* 89  BUN 8  --   --  9 11 15 21    CREATININE 0.90  --   --  1.10 1.02 1.04 1.12  CALCIUM  7.2*  --   --  7.2* 6.7* 7.6* 8.2*  MG  --  0.8*  --  3.1* 2.3  --   --   PHOS  --   --   --  2.2* 2.8  --   --    GFR: Estimated Creatinine Clearance: 42.5 mL/min (by C-G formula based on SCr of 1.12 mg/dL). Recent Labs  Lab 05/12/23 0334 05/12/23 0530 05/12/23 0541 05/13/23 0450 05/14/23 0147 05/14/23 1913 05/15/23 0418  PROCALCITON  --  <0.10  --   --   --   --   --   WBC  --   --   --  36.2* 25.3* 21.3* 17.8*  LATICACIDVEN 1.4  --  0.6  --   --   --   --     Liver Function Tests: Recent Labs  Lab 05/12/23 0327 05/12/23 1355 05/14/23 1913  AST 24 32  --   ALT 13 13  --   ALKPHOS 81 75  --   BILITOT 1.1 0.9  --   PROT 4.0* 4.0*  5.1*  ALBUMIN  1.7* 1.6* 3.1*   No results for input(s): "LIPASE", "AMYLASE" in the last 168 hours. Recent Labs  Lab 05/12/23 1355  AMMONIA 13    ABG    Component Value Date/Time   PHART 7.398 05/12/2023 0756   PCO2ART 39.5 05/12/2023 0756   PO2ART 85 05/12/2023 0756   HCO3 24.4 05/12/2023 0756   TCO2 26 05/12/2023 0756   O2SAT 96 05/12/2023 0756     Coagulation Profile: No results for input(s): "INR", "PROTIME" in the last 168 hours.  Cardiac Enzymes: No results for input(s): "CKTOTAL", "CKMB", "CKMBINDEX", "TROPONINI" in the last 168 hours.  HbA1C: Hgb A1c MFr Bld  Date/Time Value Ref Range Status  05/13/2023 05:00 AM 5.0 4.8 - 5.6 % Final    Comment:    (NOTE) Pre diabetes:          5.7%-6.4%  Diabetes:              >6.4%  Glycemic control for   <7.0% adults with diabetes   02/08/2023 10:52 PM 6.2 (H) 4.8 - 5.6 % Final    Comment:    (NOTE) Pre diabetes:          5.7%-6.4%  Diabetes:              >6.4%  Glycemic control for   <7.0% adults with diabetes     CBG: Recent Labs  Lab 05/14/23 1122 05/14/23 1554 05/14/23 2030 05/15/23 0017 05/15/23 0344  GLUCAP 155* 86 100* 108* 81    Critical care time: 45 mins     Duaine German,  MD [place] [facility] Office: [phone]   See Amion for personal pager PCCM on call pager [phone] until 7pm. Please call Elink 7p-7a. [phone]

## 2023-05-15 NOTE — Plan of Care (Signed)
  Problem: Fluid Volume: Goal: Ability to maintain a balanced intake and output will improve Outcome: Progressing   Problem: Metabolic: Goal: Ability to maintain appropriate glucose levels will improve Outcome: Progressing   Problem: Tissue Perfusion: Goal: Adequacy of tissue perfusion will improve Outcome: Progressing   Problem: Fluid Volume: Goal: Hemodynamic stability will improve Outcome: Progressing   Problem: Clinical Measurements: Goal: Signs and symptoms of infection will decrease Outcome: Progressing   Problem: Education: Goal: Knowledge of General Education information will improve Description: Including pain rating scale, medication(s)/side effects and non-pharmacologic comfort measures Outcome: Progressing   Problem: Health Behavior/Discharge Planning: Goal: Ability to manage health-related needs will improve Outcome: Progressing   Problem: Clinical Measurements: Goal: Ability to maintain clinical measurements within normal limits will improve Outcome: Progressing Goal: Will remain free from infection Outcome: Progressing Goal: Diagnostic test results will improve Outcome: Progressing Goal: Respiratory complications will improve Outcome: Progressing Goal: Cardiovascular complication will be avoided Outcome: Progressing   Problem: Activity: Goal: Risk for activity intolerance will decrease Outcome: Progressing   Problem: Nutrition: Goal: Adequate nutrition will be maintained Outcome: Progressing   Problem: Coping: Goal: Level of anxiety will decrease Outcome: Progressing   Problem: Elimination: Goal: Will not experience complications related to bowel motility Outcome: Progressing Goal: Will not experience complications related to urinary retention Outcome: Progressing   Problem: Pain Managment: Goal: General experience of comfort will improve and/or be controlled Outcome: Progressing   Problem: Safety: Goal: Ability to remain free from injury  will improve Outcome: Progressing

## 2023-05-15 NOTE — Progress Notes (Signed)
 PHARMACY - ANTICOAGULATION CONSULT NOTE  Pharmacy Consult for heparin  Indication: atrial fibrillation  No Known Allergies  Patient Measurements: Height: 5\' 8"  (172.7 cm) Weight: 58.1 kg (128 lb 1.4 oz) IBW/kg (Calculated) : 68.4 HEPARIN  DW (KG): 64  Vital Signs: Temp: 97.8 F (36.6 C) (05/04 0346) Temp Source: Axillary (05/04 0346) BP: 117/49 (05/04 0700) Pulse Rate: 93 (05/04 0700)  Labs: Recent Labs    05/13/23 0450 05/13/23 0602 05/13/23 2346 05/14/23 0147 05/14/23 0340 05/14/23 1121 05/14/23 1913 05/15/23 0418  HGB 7.0*  --   --  6.6*  --   --  8.3* 8.2*  HCT 22.6*  --   --  21.9*  --   --  25.9* 25.6*  PLT 236  --   --  223  --   --  155 125*  APTT 180*  --  33  --   --  64*  --  76*  HEPARINUNFRC  --  >1.10*  --   --   --  >1.10*  --  >1.10*  CREATININE 1.02  --   --  1.04  --   --   --  1.12  TROPONINIHS  --   --   --  94* 122*  --   --   --     Estimated Creatinine Clearance: 42.5 mL/min (by C-G formula based on SCr of 1.12 mg/dL).   Assessment: 89 YOM who presented with with lethargy and hypoglycemia. He became hypoxic and CXR was positive for L hemithroax opacification. Concern for possible pneumonia and osteomyelitis of foot was also raised. He has a history of atrial fibrillation for which he takes Eliquis . Last dose was on 4/30 at 2100. Due to potential for procedures, pharmacy has been consulted to dose heparin .   aPTT therapeutic at 76 sec; heparin  level falsely elevated as expected from Eliquis .  Hemoglobin increased post transfusion; platelet count is trending down.  No bleeding reported.  Goal of Therapy:  Heparin  level 0.3-0.7 units/ml aPTT 66-102 seconds Monitor platelets by anticoagulation protocol: Yes   Plan:  Continue IV heparin  at 900 units/hr Daily heparin  level, aPTT and CBC Monitor for s/sx of bleeding  Hersh Minney D. Marikay Show, PharmD, BCPS, BCCCP 05/15/2023, 8:28 AM

## 2023-05-15 NOTE — Progress Notes (Signed)
 Placed patient on bipap due to increased shortness of breath.

## 2023-05-15 NOTE — Plan of Care (Signed)
   Problem: Education: Goal: Ability to describe self-care measures that may prevent or decrease complications (Diabetes Survival Skills Education) will improve Outcome: Not Progressing Goal: Individualized Educational Video(s) Outcome: Not Progressing   Problem: Coping: Goal: Ability to adjust to condition or change in health will improve Outcome: Not Progressing

## 2023-05-15 NOTE — Progress Notes (Signed)
 Daily Progress Note   Patient Name: Donald Rios       Date: 05/15/2023 DOB: 1940-06-06  Age: 83 y.o. MRN#: [id number] Attending Physician: Wilfredo Hanly, MD Primary Care Physician: Austine Lefort, MD Admit Date: 05/12/2023  Reason for Consultation/Follow-up: Establishing goals of care  Length of Stay: 3  Current Medications: Scheduled Meds:   sodium chloride    Intravenous Once   amiodarone   200 mg Oral q morning   ascorbic acid   250 mg Oral BID   atorvastatin   40 mg Oral Daily   Chlorhexidine  Gluconate Cloth  6 each Topical Daily   enoxaparin (LOVENOX) injection  60 mg Subcutaneous BID   famotidine   20 mg Oral Daily   furosemide   40 mg Intravenous BID   insulin  aspart  0-9 Units Subcutaneous Q4H   ipratropium-albuterol   3 mL Nebulization Q6H   leptospermum manuka honey  1 Application Topical Daily   midodrine   15 mg Oral Q8H   mirtazapine   30 mg Oral QHS   multivitamin with minerals  1 tablet Oral Daily   nutrition supplement (JUVEN)  1 packet Oral BID BM   mouth rinse  15 mL Mouth Rinse 4 times per day   polyethylene glycol  17 g Oral Daily   pyridOXINE   25 mg Oral Daily   senna-docusate  1 tablet Oral QHS   sodium chloride  flush  10-40 mL Intracatheter Q12H   sodium chloride  flush  3 mL Intravenous Q12H   zinc  sulfate (50mg  elemental zinc )  220 mg Oral Daily    Continuous Infusions:  albumin  human 25 g (05/15/23 1520)   ceFEPime  (MAXIPIME ) IV Stopped (05/15/23 0646)   norepinephrine  (LEVOPHED ) Adult infusion Stopped (05/14/23 1246)    PRN Meds: acetaminophen  **OR** acetaminophen , guaiFENesin , mouth rinse, sodium chloride  flush  Physical Exam Vitals reviewed.  Constitutional:      General: He is not in acute distress.    Appearance: He is ill-appearing.      Interventions: Nasal cannula in place.  Cardiovascular:     Rate and Rhythm: Normal rate.  Pulmonary:     Effort: Tachypnea present.  Skin:    General: Skin is warm and dry.     Findings: Bruising present.  Neurological:     Mental Status: He is alert. He is confused.  Vital Signs: BP 92/72   Pulse 99   Temp (!) 96.3 F (35.7 C) (Axillary)   Resp (!) 30   Ht 5\' 8"  (1.727 m)   Wt 58.1 kg   SpO2 100%   BMI 19.48 kg/m  SpO2: SpO2: 100 % O2 Device: O2 Device: CPAP O2 Flow Rate: O2 Flow Rate (L/min): 13 L/min   Patient Active Problem List   Diagnosis Date Noted   Acute respiratory failure with hypoxia (HCC) 05/12/2023   Pleural effusion on left 05/12/2023   Acute osteomyelitis of left foot (HCC) 05/12/2023   Hypoglycemia 05/12/2023   Acute encephalopathy 05/12/2023   Elevated troponin 05/12/2023   Hypokalemia 05/12/2023   Pyogenic inflammation of bone (HCC) 05/12/2023   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    Protein-calorie malnutrition, severe 04/15/2023   Severe sepsis (HCC) 04/14/2023   Sepsis (HCC) 04/12/2023   Pseudoaneurysm of right femoral artery (HCC) 03/22/2023   PAD (peripheral artery disease) (HCC) 03/22/2023   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) 02/24/2023   Encntr for surgical aftcr following surgery on the circ sys 02/24/2023   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) 02/24/2023   Cutaneous abscess of left foot 02/24/2023   Gangrene of toe of left foot (HCC) 02/10/2023   Cellulitis of left lower limb 02/09/2023   Sepsis due to cellulitis (HCC) 02/08/2023   Athscl heart disease of native coronary artery w/o ang pctrs 01/12/2023   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 01/12/2023   Unspecified hearing loss, unspecified ear 01/12/2023   Hyperlipidemia, unspecified 01/12/2023   Personal history of nicotine dependence 01/12/2023   Presence of  aortocoronary bypass graft 01/12/2023   COPD (chronic obstructive pulmonary disease) (HCC) 01/12/2023   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/12/2023   Unspecified osteoarthritis, unspecified site 01/12/2023   Unqualified visual loss, left eye, normal vision right eye 01/12/2023   Gastro-esophageal reflux disease with esophagitis, without bleeding 01/12/2023   Long term (current) use of oral hypoglycemic drugs 01/12/2023   Long term (current) use of anticoagulants 01/12/2023   Hypotension, unspecified 01/12/2023   Type 2 diabetes mellitus with diabetic chronic kidney disease (HCC) 01/12/2023   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, or unspecified chronic kidney disease (HCC) 01/12/2023   History of falling 01/12/2023   Gout, unspecified 01/12/2023   Dvrtclos of intest, part unsp, 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 01/12/2023   Incisional hernia, without obstruction or gangrene 01/12/2023   High risk medication use 10/25/2022   Screening for tuberculosis 10/25/2022   Chronic atrial fibrillation (HCC) 08/15/2022   Cardiomyopathy (HCC) 08/15/2022   Temporal arteritis (HCC) 08/13/2022   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 male  with past medical history of CAD status post CABG, COPD, diabetes mellitus, ischemic cardiomyopathy with chronic HFrEF, and atrial fibrillation on Eliquis  admitted on  05/12/2023 with lethargy.    CT  Chest concerning for L hemithorax opacification and patchy nodular airspace opacity in the posterior RUL and scattered throughout the RLL with dense consolidative airspace disease through the RLL, concerning for multifocal PNA.    He was recently admitted 04/12/23 to [facility] for sepsis secondary to left lower extremity cellulitis. Course was complicated by hypotension which was felt to be secondary to hypovolemia, sepsis, or adrenal insufficiency. He was started on midodrine  and fludrocortisone  with improvement. He was treated for cellulitis with antibiotics and was discharged to SNF. He has since been discharged from SNF to home   Today's Discussion: Received update from nursing. Patient sitting up in bed with nasal cannula on. He is more confused today and very focused on wanting to walk again. Attempted to redirect several times. He was unable to participate in goals of care conversations or make complex decisions.  Patient's wife and daughter were present at bedside. Wife is proxy Management consultant. We reviewed our conversation from yesterday regarding goals of care. I shared my concern that the patient is at high risk for further decline despite aggressive treatment efforts. His respiratory status is fragile and his reserve is small. They understand. I encourage them to look for ACP documents which patient has hidden in the house to see if patient has listed any instances in which he would want a natural death. Encouraged them to discuss goals with patient when he is able to have complex conversations.   Daughter asked me to speak outside the room. She shared that they have had a difficult relationship over the last 10 years but she wants to support him. She does not believe the patient has a good understanding of the severity of his hospitalization. We discussed confusion/delirium in hospital. Hard choices book given to daughter. Questions answered and emotional support provided.  Encouraged family  to call PMT with questions or concerns. PMT will support.  Recommendations/Plan: Full code  Full scope of care Encouraged spouse to continue considering goals of care  PMT to support    Code Status:    Code Status Orders  (From admission, onward)           Start     Ordered   05/12/23 0533  Full code  Continuous       Question:  By:  Answer:  Consent: discussion documented in EHR   05/12/23 0534           Extensive chart review has been completed prior to seeing the patient including labs, vital signs, imaging, progress/consult notes, orders, medications, and available advance directive documents.   Care plan was discussed with bedside RN  Time spent: 50 minutes  Thank you for allowing the Palliative Medicine Team to assist in the care of this patient.    Daina Drum, NP  Please contact Palliative Medicine Team phone at [phone] for questions and concerns.

## 2023-05-16 ENCOUNTER — Inpatient Hospital Stay (HOSPITAL_COMMUNITY)

## 2023-05-16 DIAGNOSIS — Z4682 Encounter for fitting and adjustment of non-vascular catheter: Secondary | ICD-10-CM | POA: Diagnosis not present

## 2023-05-16 DIAGNOSIS — B371 Pulmonary candidiasis: Secondary | ICD-10-CM

## 2023-05-16 DIAGNOSIS — J9601 Acute respiratory failure with hypoxia: Secondary | ICD-10-CM | POA: Diagnosis not present

## 2023-05-16 DIAGNOSIS — Z515 Encounter for palliative care: Secondary | ICD-10-CM | POA: Diagnosis not present

## 2023-05-16 DIAGNOSIS — I509 Heart failure, unspecified: Secondary | ICD-10-CM | POA: Diagnosis not present

## 2023-05-16 DIAGNOSIS — J1282 Pneumonia due to coronavirus disease 2019: Secondary | ICD-10-CM | POA: Diagnosis not present

## 2023-05-16 DIAGNOSIS — I5022 Chronic systolic (congestive) heart failure: Secondary | ICD-10-CM

## 2023-05-16 DIAGNOSIS — Z7189 Other specified counseling: Secondary | ICD-10-CM | POA: Diagnosis not present

## 2023-05-16 DIAGNOSIS — M86171 Other acute osteomyelitis, right ankle and foot: Secondary | ICD-10-CM | POA: Diagnosis not present

## 2023-05-16 DIAGNOSIS — J90 Pleural effusion, not elsewhere classified: Secondary | ICD-10-CM | POA: Diagnosis not present

## 2023-05-16 DIAGNOSIS — M86172 Other acute osteomyelitis, left ankle and foot: Secondary | ICD-10-CM | POA: Diagnosis not present

## 2023-05-16 LAB — BASIC METABOLIC PANEL WITH GFR
Anion gap: 7 (ref 5–15)
BUN: 28 mg/dL — ABNORMAL HIGH (ref 8–23)
CO2: 23 mmol/L (ref 22–32)
Calcium: 8.4 mg/dL — ABNORMAL LOW (ref 8.9–10.3)
Chloride: 109 mmol/L (ref 98–111)
Creatinine, Ser: 1.38 mg/dL — ABNORMAL HIGH (ref 0.61–1.24)
GFR, Estimated: 51 mL/min — ABNORMAL LOW (ref >60)
Glucose, Bld: 118 mg/dL — ABNORMAL HIGH (ref 70–99)
Potassium: 4.3 mmol/L (ref 3.5–5.1)
Sodium: 139 mmol/L (ref 135–145)

## 2023-05-16 LAB — ECHOCARDIOGRAM LIMITED
AR max vel: 2.13 cm2
AV Peak grad: 5.3 mmHg
Ao pk vel: 1.15 m/s
Area-P 1/2: 6.17 cm2
Height: 68 in
MV M vel: 4.96 m/s
MV Peak grad: 98.2 mmHg
S' Lateral: 4.6 cm
Weight: 2049.4 [oz_av]

## 2023-05-16 LAB — GLUCOSE, CAPILLARY
Glucose-Capillary: 99 mg/dL (ref 70–99)
Glucose-Capillary: 119 mg/dL — ABNORMAL HIGH (ref 70–99)
Glucose-Capillary: 98 mg/dL (ref 70–99)
Glucose-Capillary: 83 mg/dL (ref 70–99)
Glucose-Capillary: 77 mg/dL (ref 70–99)

## 2023-05-16 LAB — POCT I-STAT 7, (LYTES, BLD GAS, ICA,H+H)
Acid-base deficit: 3 mmol/L — ABNORMAL HIGH (ref 0.0–2.0)
Bicarbonate: 20.7 mmol/L (ref 20.0–28.0)
Calcium, Ion: 1.16 mmol/L (ref 1.15–1.40)
HCT: 23 % — ABNORMAL LOW (ref 39.0–52.0)
Hemoglobin: 7.8 g/dL — ABNORMAL LOW (ref 13.0–17.0)
O2 Saturation: 92 %
Patient temperature: 97.5
Potassium: 4.1 mmol/L (ref 3.5–5.1)
Sodium: 141 mmol/L (ref 135–145)
TCO2: 22 mmol/L (ref 22–32)
pCO2 arterial: 31.7 mmHg — ABNORMAL LOW (ref 32–48)
pH, Arterial: 7.42 (ref 7.35–7.45)
pO2, Arterial: 59 mmHg — ABNORMAL LOW (ref 83–108)

## 2023-05-16 LAB — CBC
HCT: 24.7 % — ABNORMAL LOW (ref 39.0–52.0)
Hemoglobin: 7.8 g/dL — ABNORMAL LOW (ref 13.0–17.0)
MCH: 29 pg (ref 26.0–34.0)
MCHC: 31.6 g/dL (ref 30.0–36.0)
MCV: 91.8 fL (ref 80.0–100.0)
Platelets: 90 10*3/uL — ABNORMAL LOW (ref 150–400)
RBC: 2.69 MIL/uL — ABNORMAL LOW (ref 4.22–5.81)
RDW: 23.5 % — ABNORMAL HIGH (ref 11.5–15.5)
WBC: 12.1 10*3/uL — ABNORMAL HIGH (ref 4.0–10.5)
nRBC: 0.3 % — ABNORMAL HIGH (ref 0.0–0.2)

## 2023-05-16 LAB — COOXEMETRY PANEL
Carboxyhemoglobin: 2.7 % — ABNORMAL HIGH (ref 0.5–1.5)
Methemoglobin: <0.7 % (ref 0.0–1.5)
O2 Saturation: 48.7 %
Total hemoglobin: 7.6 g/dL — ABNORMAL LOW (ref 12.0–16.0)

## 2023-05-16 LAB — PHOSPHORUS: Phosphorus: 3 mg/dL (ref 2.5–4.6)

## 2023-05-16 LAB — MAGNESIUM: Magnesium: 2.4 mg/dL (ref 1.7–2.4)

## 2023-05-16 MED ORDER — GUAIFENESIN 100 MG/5ML PO LIQD: 5.0000 mL | ORAL | Status: DC | PRN

## 2023-05-16 MED ORDER — FUROSEMIDE 10 MG/ML IJ SOLN
15.0000 mg/h | INTRAVENOUS | Status: DC
  Administered 2023-05-16: 15 mg/h via INTRAVENOUS
  Filled 2023-05-16: qty 20

## 2023-05-16 MED ORDER — ZINC SULFATE 220 (50 ZN) MG PO CAPS
220.0000 mg | ORAL_CAPSULE | Freq: Every day | ORAL | Status: DC
Start: 2023-05-17 — End: 2023-05-16

## 2023-05-16 MED ORDER — ONDANSETRON HCL 4 MG/2ML IJ SOLN: 4.0000 mg | Freq: Four times a day (QID) | INTRAMUSCULAR | Status: DC | PRN

## 2023-05-16 MED ORDER — GLYCOPYRROLATE 1 MG PO TABS: 1.0000 mg | ORAL_TABLET | ORAL | Status: DC | PRN

## 2023-05-16 MED ORDER — GLYCOPYRROLATE 0.2 MG/ML IJ SOLN: 0.2000 mg | INTRAMUSCULAR | Status: DC | PRN

## 2023-05-16 MED ORDER — ONDANSETRON 4 MG PO TBDP: 4.0000 mg | ORAL_TABLET | Freq: Four times a day (QID) | ORAL | Status: DC | PRN

## 2023-05-16 MED ORDER — VITAMIN B-6 25 MG PO TABS: 25.0000 mg | ORAL_TABLET | Freq: Every day | ORAL | Status: DC

## 2023-05-16 MED ORDER — PERFLUTREN LIPID MICROSPHERE
1.0000 mL | INTRAVENOUS | Status: DC | PRN
  Administered 2023-05-16: 4 mL via INTRAVENOUS

## 2023-05-16 MED ORDER — DOBUTAMINE-DEXTROSE 4-5 MG/ML-% IV SOLN
2.5000 ug/kg/min | INTRAVENOUS | Status: DC
  Administered 2023-05-16: 2.5 ug/kg/min via INTRAVENOUS
  Filled 2023-05-16: qty 250

## 2023-05-16 MED ORDER — JUVEN PO PACK
1.0000 | PACK | Freq: Two times a day (BID) | ORAL | Status: DC
  Administered 2023-05-16: 1
  Filled 2023-05-16: qty 1

## 2023-05-16 MED ORDER — MIDODRINE HCL 5 MG PO TABS: 10.0000 mg | ORAL_TABLET | Freq: Three times a day (TID) | ORAL | Status: DC

## 2023-05-16 MED ORDER — OSMOLITE 1.5 CAL PO LIQD
1000.0000 mL | ORAL | Status: DC
  Administered 2023-05-16: 1000 mL
  Filled 2023-05-16: qty 1000

## 2023-05-16 MED ORDER — FUROSEMIDE 10 MG/ML IJ SOLN: 80.0000 mg | Freq: Two times a day (BID) | INTRAMUSCULAR | Status: DC

## 2023-05-16 MED ORDER — FUROSEMIDE 10 MG/ML IJ SOLN
40.0000 mg | Freq: Once | INTRAMUSCULAR | Status: AC
  Administered 2023-05-16: 40 mg via INTRAVENOUS
  Filled 2023-05-16: qty 4

## 2023-05-16 MED ORDER — PROSOURCE TF20 ENFIT COMPATIBL EN LIQD
60.0000 mL | Freq: Every day | ENTERAL | Status: DC
  Administered 2023-05-16: 60 mL
  Filled 2023-05-16: qty 60

## 2023-05-16 MED ORDER — MIDODRINE HCL 5 MG PO TABS
10.0000 mg | ORAL_TABLET | Freq: Three times a day (TID) | ORAL | Status: DC
  Administered 2023-05-16: 10 mg
  Filled 2023-05-16: qty 2

## 2023-05-16 MED ORDER — MORPHINE 100MG IN NS 100ML (1MG/ML) PREMIX INFUSION
0.0000 mg/h | INTRAVENOUS | Status: DC
Start: 2023-05-16 — End: 2023-05-16
  Administered 2023-05-16: 5 mg/h via INTRAVENOUS
  Filled 2023-05-16: qty 100

## 2023-05-16 MED ORDER — SODIUM CHLORIDE 0.9% FLUSH: 3.0000 mL | INTRAVENOUS | Status: DC | PRN

## 2023-05-16 MED ORDER — MIRTAZAPINE 15 MG PO TABS: 30.0000 mg | ORAL_TABLET | Freq: Every day | ORAL | Status: DC

## 2023-05-16 MED ORDER — ATORVASTATIN CALCIUM 40 MG PO TABS
40.0000 mg | ORAL_TABLET | Freq: Every day | ORAL | Status: DC
Start: 2023-05-17 — End: 2023-05-16

## 2023-05-16 MED ORDER — POLYETHYLENE GLYCOL 3350 17 G PO PACK: 17.0000 g | PACK | Freq: Every day | ORAL | Status: DC

## 2023-05-16 MED ORDER — APIXABAN 2.5 MG PO TABS: 2.5000 mg | ORAL_TABLET | Freq: Two times a day (BID) | ORAL | Status: DC

## 2023-05-16 MED ORDER — SENNOSIDES-DOCUSATE SODIUM 8.6-50 MG PO TABS: 1.0000 | ORAL_TABLET | Freq: Every day | ORAL | Status: DC

## 2023-05-16 MED ORDER — FAMOTIDINE 20 MG PO TABS: 20.0000 mg | ORAL_TABLET | Freq: Every day | ORAL | Status: DC

## 2023-05-16 MED ORDER — METOLAZONE 2.5 MG PO TABS
5.0000 mg | ORAL_TABLET | Freq: Once | ORAL | Status: AC
  Administered 2023-05-16: 5 mg
  Filled 2023-05-16: qty 2

## 2023-05-16 MED ORDER — THIAMINE MONONITRATE 100 MG PO TABS
100.0000 mg | ORAL_TABLET | Freq: Every day | ORAL | Status: DC
  Administered 2023-05-16: 100 mg
  Filled 2023-05-16: qty 1

## 2023-05-16 MED ORDER — ADULT MULTIVITAMIN W/MINERALS CH: 1.0000 | ORAL_TABLET | Freq: Every day | ORAL | Status: DC

## 2023-05-16 MED ORDER — VITAMIN C 500 MG PO TABS: 250.0000 mg | ORAL_TABLET | Freq: Two times a day (BID) | ORAL | Status: DC

## 2023-05-16 MED ORDER — SODIUM CHLORIDE 0.9% FLUSH: 3.0000 mL | Freq: Two times a day (BID) | INTRAVENOUS | Status: DC

## 2023-05-16 MED ORDER — LORAZEPAM 2 MG/ML IJ SOLN: 2.0000 mg | INTRAMUSCULAR | Status: DC | PRN

## 2023-05-16 MED ORDER — AMIODARONE HCL 200 MG PO TABS: 200.0000 mg | ORAL_TABLET | Freq: Every morning | ORAL | Status: DC

## 2023-05-16 MED ORDER — POLYVINYL ALCOHOL 1.4 % OP SOLN: 1.0000 [drp] | Freq: Four times a day (QID) | OPHTHALMIC | Status: DC | PRN

## 2023-05-16 MED ORDER — VITAL HIGH PROTEIN PO LIQD: 1000.0000 mL | ORAL | Status: DC

## 2023-05-16 MED ORDER — MORPHINE BOLUS VIA INFUSION: 5.0000 mg | INTRAVENOUS | Status: DC | PRN

## 2023-05-17 LAB — BODY FLUID CULTURE W GRAM STAIN

## 2023-05-17 LAB — CULTURE, BLOOD (ROUTINE X 2)
Culture: NO GROWTH
Culture: NO GROWTH
Special Requests: ADEQUATE

## 2023-05-17 LAB — CYTOLOGY - NON PAP

## 2023-05-18 LAB — FUNGITELL BETA-D-GLUCAN
Fungitell Value:: 51.827 pg/mL
Result Name:: NEGATIVE

## 2023-06-07 ENCOUNTER — Ambulatory Visit: Payer: Medicare Other | Admitting: Internal Medicine

## 2023-06-12 NOTE — Procedures (Signed)
 Cortrak  Person Inserting Tube:  Norvel Beer, RD Tube Type:  Cortrak - 43 inches Tube Size:  10 Tube Location:  Left nare Secured by: Bridle Technique Used to Measure Tube Placement:  Marking at nare/corner of mouth Cortrak Secured At:  75 cm   Cortrak Tube Team Note:  Consult received to place a Cortrak feeding tube.   Difficult feeding tube placement; abd xray ordered to confirm placement prior to use. RN is aware.  If the tube becomes dislodged please keep the tube and contact the Cortrak team at [email or web address] for replacement.  If after hours and replacement cannot be delayed, place a NG tube and confirm placement with an abdominal x-ray.    Norvel Beer MS, RDN, LDN, CNSC Registered Dietitian 3 Clinical Nutrition RD Inpatient Contact Info in Amion

## 2023-06-12 NOTE — Progress Notes (Cosign Needed Addendum)
 [facility]  Date of Admission:  05/12/2023     Reason for Follow Up: Acute respiratory failure with hypoxia (HCC)  Total days of antibiotics 6         ASSESSMENT:  Donald Rios remains on high flow nasal cannula in the setting of acute respiratory failure secondary to post COVID-pneumonia and pleural effusion along with findings of osteomyelitis of the left fifth metatarsal head.  Pleural fluid and blood cultures without growth to date.  Sputum culture with rare Candida albicans likely colonization.  Discussed plan of care to continue current dose of cefepime .  Vascular surgery awaiting improvement in respiratory status prior to any additional surgical interventions for osteomyelitis.  Standard/universal precautions.  Respiratory and remaining medical and supportive care per PCCM.    PLAN:  Continue current dose of cefepime . Monitor pleural fluid and blood cultures for any growth. Standard/universal precautions. Additional surgical interventions for osteomyelitis per vascular surgery. Remaining medical and supportive care per PCCM.  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   Pyogenic inflammation of bone (HCC)    sodium chloride    Intravenous Once   amiodarone   200 mg Oral q morning   apixaban   2.5 mg Oral BID   ascorbic acid   250 mg Oral BID   atorvastatin   40 mg Oral Daily    Chlorhexidine  Gluconate Cloth  6 each Topical Daily   famotidine   20 mg Oral Daily   furosemide   80 mg Intravenous BID   insulin  aspart  0-9 Units Subcutaneous Q4H   ipratropium-albuterol   3 mL Nebulization Q6H   leptospermum manuka honey  1 Application Topical Daily   midodrine   10 mg Oral Q8H   mirtazapine   30 mg Oral QHS   multivitamin with minerals  1 tablet Oral Daily   nutrition supplement (JUVEN)  1 packet Oral BID BM   mouth rinse  15 mL Mouth Rinse 4 times per day   polyethylene glycol  17 g Oral Daily   pyridOXINE   25 mg Oral Daily   senna-docusate  1 tablet Oral QHS   sodium chloride  flush  10-40 mL Intracatheter Q12H   sodium chloride  flush  3 mL Intravenous Q12H   zinc  sulfate (50mg  elemental zinc )  220 mg Oral Daily    SUBJECTIVE:  Afebrile overnight with no acute events and improving leukocytosis with most recent white blood cell count of 12,100.  Core track inserted today.  Lethargic during visit today.  No Known Allergies   Review of Systems: Review of Systems  Constitutional:  Negative for chills, fever and weight loss.  Respiratory:  Positive for cough. Negative for shortness of breath and wheezing.   Cardiovascular:  Negative for chest pain and leg swelling.  Gastrointestinal:  Negative for abdominal pain, constipation, diarrhea, nausea and vomiting.  Skin:  Negative for rash.      OBJECTIVE: Vitals:   06/11/2023 1000 06/10/2023 1100 05/22/2023  1128 05/25/2023 1200  BP: (!) 129/57 99/76 (!) 117/54 (!) 121/52  Pulse: 89 86 84 81  Resp: (!) 32 (!) 27 (!) 30 (!) 31  Temp:   97.6 F (36.4 C)   TempSrc:   Axillary   SpO2: 99% 95% 97% 97%  Weight:      Height:       Body mass index is 19.48 kg/m.  Physical Exam Constitutional:      General: He is not in acute distress.    Appearance: He is well-developed. He is ill-appearing.     Interventions: Nasal cannula in place.  Cardiovascular:     Rate and Rhythm: Normal rate and regular rhythm.     Heart  sounds: Normal heart sounds.  Pulmonary:     Effort: Pulmonary effort is normal. Tachypnea present.     Breath sounds: Normal breath sounds.  Skin:    General: Skin is warm and dry.  Neurological:     Mental Status: He is oriented to person, place, and time. He is lethargic.     Lab Results Lab Results  Component Value Date   WBC 12.1 (H) 06/11/2023   HGB 7.8 (L) 05/20/2023   HCT 24.7 (L) 05/19/2023   MCV 91.8 05/25/2023   PLT 90 (L) 06/06/2023    Lab Results  Component Value Date   CREATININE 1.38 (H) 05/13/2023   BUN 28 (H) 06/09/2023   NA 139 05/30/2023   K 4.3 06/06/2023   CL 109 05/28/2023   CO2 23 05/22/2023    Lab Results  Component Value Date   ALT 13 05/12/2023   AST 32 05/12/2023   ALKPHOS 75 05/12/2023   BILITOT 0.9 05/12/2023     Microbiology: Recent Results (from the past 240 hours)  Culture, blood (routine x 2)     Status: None (Preliminary result)   Collection Time: 05/12/23  3:28 AM   Specimen: BLOOD RIGHT HAND  Result Value Ref Range Status   Specimen Description BLOOD RIGHT HAND  Final   Special Requests   Final    BOTTLES DRAWN AEROBIC AND ANAEROBIC Blood Culture adequate volume   Culture   Final    NO GROWTH 4 DAYS Performed at [facility], [address] [place], [place] [address]    Report Status PENDING  Incomplete  Expectorated Sputum Assessment w Gram Stain, Rflx to Resp Cult     Status: None   Collection Time: 05/12/23 10:46 AM   Specimen: Expectorated Sputum  Result Value Ref Range Status   Specimen Description EXPECTORATED SPUTUM  Final   Special Requests NONE  Final   Sputum evaluation   Final    THIS SPECIMEN IS ACCEPTABLE FOR SPUTUM CULTURE Performed at [facility], [address] [place], [place] [address]    Report Status 05/12/2023 FINAL  Final  Culture, Respiratory w Gram Stain     Status: None   Collection Time: 05/12/23 10:46 AM  Result Value Ref Range Status   Specimen Description EXPECTORATED  SPUTUM  Final   Special Requests NONE Reflexed from [id number]  Final   Gram Stain   Final    MODERATE WBC PRESENT, PREDOMINANTLY PMN RARE GRAM POSITIVE RODS RARE YEAST WITH PSEUDOHYPHAE RARE GRAM POSITIVE COCCI IN CHAINS Performed at [facility], [address], [place], [place] [address]    Culture RARE CANDIDA ALBICANS  Final   Report Status 05/15/2023 FINAL  Final  MRSA Next Gen by PCR, Nasal  Status: None   Collection Time: 05/12/23 10:52 AM   Specimen: Nasal Mucosa; Nasal Swab  Result Value Ref Range Status   MRSA by PCR Next Gen NOT DETECTED NOT DETECTED Final    Comment: (NOTE) The GeneXpert MRSA Assay (FDA approved for NASAL specimens only), is one component of a comprehensive MRSA colonization surveillance program. It is not intended to diagnose MRSA infection nor to guide or monitor treatment for MRSA infections. Test performance is not FDA approved in patients less than 39 years old. Performed at [facility], [address], [place], [place] [address]   Culture, blood (routine x 2)     Status: None (Preliminary result)   Collection Time: 05/12/23 12:32 PM   Specimen: BLOOD  Result Value Ref Range Status   Specimen Description BLOOD SITE NOT SPECIFIED  Final   Special Requests   Final    BOTTLES DRAWN AEROBIC AND ANAEROBIC Blood Culture results may not be optimal due to an inadequate volume of blood received in culture bottles   Culture   Final    NO GROWTH 4 DAYS Performed at [facility], [address] [place], [place] [address]    Report Status PENDING  Incomplete  Body fluid culture w Gram Stain     Status: None (Preliminary result)   Collection Time: 05/14/23  5:08 PM   Specimen: Pleural Fluid  Result Value Ref Range Status   Specimen Description PLEURAL  Final   Special Requests LEFT  Final   Gram Stain RARE WBC SEEN NO ORGANISMS SEEN   Final   Culture   Final    NO GROWTH 2 DAYS Performed at [facility], [address] [place], [place] [address]    Report Status PENDING  Incomplete     Donald Silva, NP [facility]  06/05/2023  12:28 PM

## 2023-06-12 NOTE — Progress Notes (Signed)
 Nutrition Follow-up  DOCUMENTATION CODES:  Severe malnutrition in context of chronic illness  INTERVENTION:  Initiate tube feeding via cortrak: Osmolite 1.5 at 50 ml/h (1200 ml per day) Start at 20 and advance by 10mL q8h to goal Prosource TF20 60 ml 1x/d Provides 1880 kcal, 95 gm protein, 914 ml free water daily Pt at risk for refeeding as nutrition has been inadequate, will provide 100mg  of thiamine  x 5 days while initiating feeds.  Resume diet when breathing improves  1 packet Juven BID, each packet provides 95 calories, 2.5 grams of protein (collagen), and 9.8 grams of carbohydrate (3 grams sugar) + micronutrients to support wound healing When able to take in PO, resume Magic cup TID with meals, each supplement provides 290 kcal and 9 grams of protein When able to take in PO, Carnation Instant Breakfast BID- each packet provides 130kcal and 5g protein +milk Replace vitamin C  (250mg  BID x 30 days), zinc  (220mg  x 14 days), and B6 (25mcg x 14 days)  NUTRITION DIAGNOSIS:  Severe Malnutrition related to chronic illness (COPD, CHF) as evidenced by severe fat depletion, severe muscle depletion, percent weight loss (17.5% x 6 months). - remains applicable  GOAL:  Patient will meet greater than or equal to 90% of their needs - progressing  MONITOR:  PO intake, Supplement acceptance, Skin, Labs  REASON FOR ASSESSMENT:  Consult Assessment of nutrition requirement/status  ASSESSMENT:  Pt with hx of DM type 2, COPD, CAD, CHF, atrial fibrillation, PAD, gout, HTN, and HLD presented to ED with lethargy and hypoxia. Found to be in septic shock likely due to osteomyelitis.  5/1 - admitted to ICU 5/2 - MBS, DYS3/thin liquids 5/3 - thoracentesis 5/4 - Placed on BiPAP for SOB 5/5 - cortrak placed gastric  Pt resting in bed at the time of assessment, just finished having cortrak placed and currently undergoing ECHO.   Discussed in rounds, pt much more short of breath and agitated today.  Will initiate and advance TF slowly as pt has been without adequate nutrition this admission and also while at SNF.   PMT following and assisting with GOC.  Admit weight: 64 kg   Current weight: 58.1 kg  17.5% weight loss x 6 month (11/2022-05/2023) which is severe   Intake/Output Summary (Last 24 hours) at 06/10/2023 1037 Last data filed at 05/30/2023 0600 Gross per 24 hour  Intake 1163.48 ml  Output 350 ml  Net 813.48 ml  Net IO Since Admission: 5,605.17 mL [05/14/2023 1037]  Drains/Lines: PICC triple lumen UOP x 24 hours  Nutritionally Relevant Medications: Scheduled Meds:  ascorbic acid   250 mg Oral BID   atorvastatin   40 mg Oral Daily   famotidine   20 mg Oral Daily   furosemide   80 mg Intravenous BID   insulin  aspart  0-9 Units Subcutaneous Q4H   mirtazapine   30 mg Oral QHS   multivitamin with minerals  1 tablet Oral Daily   JUVEN  1 packet Oral BID BM   polyethylene glycol  17 g Oral Daily   pyridOXINE   25 mg Oral Daily   senna-docusate  1 tablet Oral QHS   zinc  sulfate  220 mg Oral Daily   Continuous Infusions:  ceFEPime  (MAXIPIME ) IV Stopped (06/01/2023 0513)   Labs Reviewed: BUN 28, creatinine 1.38 CBG ranges from 81-138 mg/dL over the last 24 hours HgbA1c 5.0%  Micronutrient Labs from 04/14/23:  Reference Range  04/14/23   CRP <1.0 mg/dL 8.8 (H)  Vitamin D , 25-Hydroxy 30 - 100 ng/mL  20.65 (L)  Vitamin A   22.0 - 69.5 ug/dL 21.3 (L)  Vitamin B12 086 - 914 pg/mL 533  Vitamin B6 3.4 - 65.2 ug/L 2.6 (L)  Vitamin C  0.4 - 2.0 mg/dL 0.3 (L)  Zinc  44 - 115 ug/dL 36 (L)   NUTRITION - FOCUSED PHYSICAL EXAM: Flowsheet Row Most Recent Value  Orbital Region Severe depletion  Upper Arm Region Moderate depletion  Thoracic and Lumbar Region Moderate depletion  Buccal Region Severe depletion  Temple Region Severe depletion  Clavicle Bone Region Moderate depletion  Clavicle and Acromion Bone Region Moderate depletion  Scapular Bone Region Moderate depletion  Dorsal  Hand Severe depletion  Patellar Region Severe depletion  Anterior Thigh Region Severe depletion  Posterior Calf Region Severe depletion  Edema (RD Assessment) None  Hair Reviewed  Eyes Reviewed  Mouth Reviewed  Skin Reviewed  Nails Reviewed    Diet Order:   Diet Order             DIET DYS 3 Room service appropriate? Yes with Assist; Fluid consistency: Thin  Diet effective now                   EDUCATION NEEDS:  Education needs have been addressed  Skin:  Skin Assessment: Reviewed RN Assessment Stage 2:  - sacrum Unstageable: - right heel - left heel - right knee Venous Stasis Ulcer - right foot - left ulcer Skin Tear/Incisions: - right hand - left arm  Last BM:  5/5 - type 6  Height:  Ht Readings from Last 1 Encounters:  05/12/23 5\' 8"  (1.727 m)    Weight:  Wt Readings from Last 1 Encounters:  05/13/23 58.1 kg    Ideal Body Weight:  70 kg  BMI:  Body mass index is 19.48 kg/m.  Estimated Nutritional Needs:  Kcal:  1700-1900 kcal/d Protein:  85-100 g/d Fluid:  1.8L/d    Edwena Graham, RD, LDN Registered Dietitian II Please reach out via secure chat

## 2023-06-12 NOTE — Progress Notes (Signed)
 Patients spouse arrived to unit, explained the process of transitioning to comfort care to Donald Rios. Allowed spouse space to ask questions and gain clarification of this process. Other family members have arrived at bedside to support Donald Rios. Covering MD made aware of comfort care transition.

## 2023-06-12 NOTE — Progress Notes (Signed)
 Cross covering ICU physician  Called to eval pt for increasing wob on escalating oxygen as well as worsening mentation. He is quite lethargic at this time and appears uncomfortable with his respiratory pattern (utilizing accessory muscles, tachypneic), unable to hold his head up and communicate. His oxygen saturations are decreasing to 90% on 45L and 60%. He is also suffering from multiple other comorbidities, including but not limited to post covid pna, osteomyelitis of L foot, copd, t2dm, hfref with lvef 20-25% on dobutamine and failure to thrive.   Concern with pt's ability to protect airway at this time. Calls placed to pt's wife who understands her husbands grave condition at this time and wants him to be comfortable. She will notify other family members and return to hospital. I have placed orders for RN to stop lasix  infusion at this time. Continue dobutamine until family arrives. Ensure comfort. We can remove oxygen when family arrives as well.   We will start morphine  infusion to improve respiratory distress and comfort as well as anxiolytic prn.   RN updated at bedside as well as daytime provider.

## 2023-06-12 NOTE — Progress Notes (Signed)
 SLP Cancellation Note  Patient Details Name: Donald Rios MRN: [id number] DOB: 27-Mar-1940   Cancelled treatment:       Reason Eval/Treat Not Completed: Medical issues which prohibited therapy. Pt is currently lethargic, requiring 40 LPM HHFNC. Discussed with RN, who states pt was orally holding POs earlier this date. If he continues to require this much supplemental O2 or more, recommend he be NPO. SLP will f/u subsequent date for reassessment as respiratory status allows.    Amil Kale, M.A., CCC-SLP Speech Language Pathology, Acute Rehabilitation Services  Secure Chat preferred [phone]  06/04/2023, 4:46 PM

## 2023-06-12 NOTE — Death Summary Note (Signed)
 DEATH SUMMARY   Patient Details  Name: Donald Rios MRN: [id number] DOB: 02-05-1940  Admission/Discharge Information   Admit Date:  May 31, 2023  Date of Death: Date of Death: Jun 04, 2023  Time of Death: Time of Death: Jun 16, 2201  Length of Stay: 4  Referring Physician: Austine Lefort, MD   Reason(s) for Hospitalization  Sepsis due to pneumonia  Diagnoses  Preliminary cause of death:  Multifocal Pneumonia  Respiratory Failure Secondary Diagnoses (including complications and co-morbidities):  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 year old male with past medical history as below, which is significant for CAD status post CABG, COPD, diabetes mellitus, ischemic cardiomyopathy with chronic HFrEF, and atrial fibrillation on Eliquis . He was recently admitted 4/1 to [facility] health for sepsis secondary to left lower extremity cellulitis. Course was complicated by hypotension which was felt to be secondary to hypovolemia, sepsis, or adrenal insufficiency. He was started on midodrine  and fludrocortisone  with improvement. He was treated for cellulitis with antibiotics and was discharged to SNF. He has since been discharged from SNF to home. He  was in his usual state of health until approximately 4/30 when he became lethargic around 2 PM. EMS was called and he was found to be hypoglycemic to 60 which improved with dextrose  administration. He was transported to [facility] emergency department for further evaluation. Initially upon arrival he was minimally responsive, but improved over the course of his stay in the ED. He was hypoxemic to the mid 80s on room air and hypotensive. Imaging of the chest was concerning for L hemithorax opacification and CT was done showing moderal left pleural effusion and atelectasis vs pneumonia as well as a right sided pneumonia. He was placed on BiPAP for hypoxia and started on empiric antibiotics. He was admitted to the hospitalists. Despite BiPAP he remained somnolent. And hypotension persisted as well. PCCM was asked to evaluate for ICU transfer. He was transferred to the ICU on 5/2 and started on levophed  for septic shock due to pneumonia. He was treated with broad spectrum antibiotics. He had left thoracentesis done on 5/3. He was weaned of levophed  and started on midodrine . Diuresis was started for heart failure. Patient unfortunately continued to decline on 5/5 with progressive respiratory failure and failture to thrive. He was transitioned to comfort care and passed away Jun 04, 2023 at 06-16-2201.    Pertinent Labs and Studies  Significant Diagnostic Studies ECHOCARDIOGRAM LIMITED Result Date: 06-04-2023    ECHOCARDIOGRAM LIMITED REPORT   Patient Name:   Donald Rios Date of Exam: 2023-06-04 Medical Rec #:  [id number]      Height:       68.0 in Accession #:    [id number]     Weight:  128.1 lb Date of Birth:  08-02-1940      BSA:          1.691 m Patient Age:    83 years       BP:           133/61 mmHg Patient Gender: M              HR:           84 bpm. Exam Location:  Inpatient Procedure: Limited Echo, Limited Color Doppler and Intracardiac Opacification            Agent (Both Spectral and Color Flow Doppler were  utilized during            procedure). Indications:    CHF I50.9  History:        Patient has prior history of Echocardiogram examinations, most                 recent 04/14/2023. Cardiomyopathy and CHF, Previous Myocardial                 Infarction and CAD, Prior CABG, COPD, PAD and CKD, stage 4,                 Arrythmias:Atrial Fibrillation, Signs/Symptoms:Hypotension; Risk                 Factors:Diabetes and Dyslipidemia.  Sonographer:    Terrilee Few RCS Referring Phys: [id number] Francyne Islam Wayne Brunker IMPRESSIONS  1. Left ventricular ejection fraction, by estimation, is 20 to 25%. The left ventricle has severely decreased function. The left ventricle demonstrates global hypokinesis.  2. Right ventricular systolic function is moderately reduced. The right ventricular size is moderately enlarged. There is moderately elevated pulmonary artery systolic pressure. The estimated right ventricular systolic pressure is 58.6 mmHg.  3. The mitral valve is abnormal. Moderate to severe mitral valve regurgitation.  4. Tricuspid valve regurgitation is moderate to severe.  5. The aortic valve is tricuspid.  6. The inferior vena cava is dilated in size with <50% respiratory variability, suggesting right atrial pressure of 15 mmHg. Comparison(s): Prior images reviewed side by side. Conclusion(s)/Recommendation(s): EF remains severely reduced, and RV with moderate enlargement/moderately decreased function. Moderate-severe MR, moderate-severe TR. FINDINGS  Left Ventricle: Left ventricular ejection fraction, by estimation, is 20 to 25%. The left ventricle has severely decreased function. The left ventricle demonstrates global hypokinesis. Definity  contrast agent was given IV to delineate the left ventricular endocardial borders. Abnormal (paradoxical) septal motion consistent with post-operative status. Right Ventricle: The right ventricular size is moderately enlarged. Right ventricular systolic function is moderately reduced. There  is moderately elevated pulmonary artery systolic pressure. The tricuspid regurgitant velocity is 3.30 m/s, and with an assumed right atrial pressure of 15 mmHg, the estimated right ventricular systolic pressure is 58.6 mmHg. Mitral Valve: The mitral valve is abnormal. Moderate to severe mitral valve regurgitation, with eccentric posteriorly directed jet. Tricuspid Valve: The tricuspid valve is normal in structure. Tricuspid valve regurgitation is moderate to severe. No evidence of tricuspid stenosis. Aortic Valve: The aortic valve is tricuspid. Aortic valve peak gradient measures 5.3 mmHg. Pulmonic Valve: The pulmonic valve was not well visualized. Pulmonic valve regurgitation is trivial. No evidence of pulmonic stenosis. Venous: The inferior vena cava is dilated in size with less than 50% respiratory variability, suggesting right atrial pressure of 15 mmHg. IAS/Shunts: There is left bowing of the interatrial septum, suggestive of elevated right atrial pressure. LEFT VENTRICLE PLAX 2D LVIDd:  5.26 cm   Diastology LVIDs:         4.60 cm   LV e' medial:    7.51 cm/s LV PW:         2.00 cm   LV E/e' medial:  12.6 LV IVS:        1.10 cm   LV e' lateral:   10.70 cm/s LVOT diam:     2.00 cm   LV E/e' lateral: 8.9 LV SV:         43 LV SV Index:   25 LVOT Area:     3.14 cm  RIGHT VENTRICLE            IVC RV Basal diam:  4.60 cm    IVC diam: 2.30 cm RV S prime:     5.96 cm/s TAPSE (M-mode): 1.2 cm LEFT ATRIUM         Index LA diam:    5.60 cm 3.31 cm/m  AORTIC VALVE AV Area (Vmax): 2.13 cm AV Vmax:        115.00 cm/s AV Peak Grad:   5.3 mmHg LVOT Vmax:      78.10 cm/s LVOT Vmean:     46.600 cm/s LVOT VTI:       0.136 m  AORTA Ao Root diam: 3.20 cm Ao Asc diam:  3.10 cm MITRAL VALVE               TRICUSPID VALVE MV Area (PHT): 6.17 cm    TR Peak grad:   43.6 mmHg MV Decel Time: 123 msec    TR Vmax:        330.00 cm/s MR Peak grad: 98.2 mmHg MR Vmax:      495.50 cm/s  SHUNTS MV E velocity: 95.00 cm/s  Systemic VTI:   0.14 m                            Systemic Diam: 2.00 cm Sheryle Donning MD Electronically signed by Sheryle Donning MD Signature Date/Time: 06/14/23/5:28:48 PM    Final    DG Abd Portable 1V Result Date: 2023/06/14 CLINICAL DATA:  Feeding tube placement EXAM: PORTABLE ABDOMEN - 1 VIEW COMPARISON:  None Available. FINDINGS: the tip of the nasogastric tube is located within the distal antrum stomach in good position. IMPRESSION: Nasogastric tube in good position. Electronically Signed   By: Fredrich Jefferson M.D.   On: 06-14-23 12:48   DG CHEST PORT 1 VIEW Result Date: 05/14/2023 CLINICAL DATA:  Status post thoracentesis EXAM: PORTABLE CHEST 1 VIEW COMPARISON:  chest x-ray 05/14/2023 FINDINGS: Right-sided central venous catheter projects over the caval atrial junction. The cardiomediastinal silhouette is enlarged. Patient is status post cardiac surgery. Patchy multifocal bilateral airspace opacities throughout the mid and lower lungs bilaterally has slightly increased in the right upper lobe. Small left pleural effusion has resolved. Very small right pleural effusion appears stable. No pneumothorax or acute osseous abnormality. IMPRESSION: 1. Small left pleural effusion has resolved. No pneumothorax. 2. Patchy multifocal bilateral airspace opacities throughout the mid and lower lungs bilaterally has slightly increased in the right upper lobe. Electronically Signed   By: Tyron Gallon M.D.   On: 05/14/2023 18:52   DG CHEST PORT 1 VIEW Result Date: 05/14/2023 CLINICAL DATA:  Respiratory failure. EXAM: PORTABLE CHEST 1 VIEW COMPARISON:  One-view chest x-ray 05/12/2023. FINDINGS: The heart is enlarged. Median sternotomy for CABG is noted. Atherosclerotic calcifications are present at the aorta. Right-sided PICC  line terminates at the cavoatrial junction. Progressive diffuse interstitial and airspace disease is present. Bilateral pleural effusions are present, left greater than right scratched at bilateral  pleural effusions have increased in size, left greater than right. The visualized soft tissues and bony thorax unremarkable. IMPRESSION: 1. Progressive diffuse interstitial and airspace disease compatible with pulmonary edema and/or infection. 2. Increasing bilateral pleural effusions, left greater than right. Electronically Signed   By: Audree Leas M.D.   On: 05/14/2023 11:30   DG Swallowing Func-Speech Pathology Result Date: 05/13/2023 Table formatting from the original result was not included. Modified Barium Swallow Study Patient Details Name: MARGARITO BONIFAY MRN: [id number] Date of Birth: [birth year] Today's Date: 05/13/2023 HPI/PMH: HPI: OATHER GONSALVES is an 83 yo male presenting to ED 5/1 with AMS and hypoglycemia. Recently admitted 4/1 to [facility] for sepsis secondary to LLE cellulitis complicated by hypotension. CT Chest concerning for L hemithorax opacification and patchy nodular airspace opacity in the posterior RUL and scattered throughout the RLL with dense consolidative airspace disease through the RLL, concerning for multifocal PNA. Seen by SLP 08/14/22 with WFL oropharyngeal swallow. PMH includes CAD s/p CABG, COPD, DM, ischemic cardiomyopathy with chronic HFrEF, A-fib on Eliquis , GERD Clinical Impression: Pt presents with mild oropharyngeal dysphagia which is suspected to be impacted by coordination of breathing and swallowing secondary to acute change in respiratory status. Pt is currently on 8 LPM HFNC and was quite anxious re: his breathing which resulted in increased WOB and tachypnea. Pt's RR and SpO2 overall remained stable when challeneged with presentations of sequential sips. He initiates a swallow at the level of the valleculae but due to incomplete laryngeal elevation, penetration occurs above the level of the vocal folds with thin and nectar thick liquids (PAS 3). Penetrates were expelled when pt was cued to clear his throat. BOT retraction is incomplete resulting in trace pharyngeal  residue. Mastication was mildly prolonged with regular solids as pt's dentures are unavailable. Recommend initiating a diet of Dys 3 solids with thin liquids using intermittent throat clearance. If pt's dentures become available over the weekend, pt's diet can be upgraded to regular per RN/MD/RD discretion. SLP will f/u at least briefly to provide education. Factors that may increase risk of adverse event in presence of aspiration [name] & Jessy Morocco 2021): Factors that may increase risk of adverse event in presence of aspiration [name] & Jessy Morocco 2021): Poor general health and/or compromised immunity; Respiratory or GI disease; Limited mobility; Frail or deconditioned; Inadequate oral hygiene Recommendations/Plan: Swallowing Evaluation Recommendations Swallowing Evaluation Recommendations Recommendations: PO diet PO Diet Recommendation: Dysphagia 3 (Mechanical soft); Thin liquids (Level 0) Liquid Administration via: Cup; Straw Medication Administration: Whole meds with puree Supervision: Staff to assist with self-feeding; Full supervision/cueing for swallowing strategies Swallowing strategies  : Minimize environmental distractions; Slow rate; Small bites/sips; Clear throat intermittently Postural changes: Position pt fully upright for meals Oral care recommendations: Oral care BID (2x/day) Treatment Plan Treatment Plan Treatment recommendations: Therapy as outlined in treatment plan below Follow-up recommendations: No SLP follow up Functional status assessment: Patient has had a recent decline in their functional status and demonstrates the ability to make significant improvements in function in a reasonable and predictable amount of time. Treatment frequency: Min 2x/week Treatment duration: 1 week Interventions: Aspiration precaution training; Compensatory techniques; Patient/family education; Trials of upgraded texture/liquids Recommendations Recommendations for follow up therapy are one component of a  multi-disciplinary discharge planning process, led by the attending physician.  Recommendations may be updated based on patient status,  additional functional criteria and insurance authorization. Assessment: Orofacial Exam: Orofacial Exam Oral Cavity: Oral Hygiene: WFL Oral Cavity - Dentition: Dentures, top; Dentures, not available; Poor condition Orofacial Anatomy: WFL Oral Motor/Sensory Function: WFL Anatomy: Anatomy: Suspected cervical osteophytes; Prominent cricopharyngeus Boluses Administered: Boluses Administered Boluses Administered: Thin liquids (Level 0); Mildly thick liquids (Level 2, nectar thick); Moderately thick liquids (Level 3, honey thick); Puree; Solid  Oral Impairment Domain: Oral Impairment Domain Lip Closure: Interlabial escape, no progression to anterior lip Tongue control during bolus hold: Cohesive bolus between tongue to palatal seal Bolus preparation/mastication: Slow prolonged chewing/mashing with complete recollection Bolus transport/lingual motion: Brisk tongue motion Oral residue: Trace residue lining oral structures Location of oral residue : Tongue; Palate Initiation of pharyngeal swallow : Posterior laryngeal surface of the epiglottis  Pharyngeal Impairment Domain: Pharyngeal Impairment Domain Soft palate elevation: No bolus between soft palate (SP)/pharyngeal wall (PW) Laryngeal elevation: Partial superior movement of thyroid  cartilage/partial approximation of arytenoids to epiglottic petiole Anterior hyoid excursion: Complete anterior movement Epiglottic movement: Complete inversion Laryngeal vestibule closure: Complete, no air/contrast in laryngeal vestibule Pharyngeal stripping wave : Present - complete Pharyngeal contraction (A/P view only): N/A Pharyngoesophageal segment opening: Partial distention/partial duration, partial obstruction of flow Tongue base retraction: Narrow column of contrast or air between tongue base and PPW Pharyngeal residue: Trace residue within or on  pharyngeal structures Location of pharyngeal residue: Tongue base; Valleculae; Pyriform sinuses  Esophageal Impairment Domain: No data recorded Pill: No data recorded Penetration/Aspiration Scale Score: Penetration/Aspiration Scale Score 1.  Material does not enter airway: Moderately thick liquids (Level 3, honey thick); Puree; Solid 3.  Material enters airway, remains ABOVE vocal cords and not ejected out: Thin liquids (Level 0); Mildly thick liquids (Level 2, nectar thick) Compensatory Strategies: Compensatory Strategies Compensatory strategies: No   General Information: Caregiver present: Yes  Diet Prior to this Study: NPO   Temperature : Normal   Respiratory Status: Tachypneic   Supplemental O2: High flow nasal cannula (8L)   History of Recent Intubation: No  Behavior/Cognition: Alert; Cooperative Self-Feeding Abilities: Needs assist with self-feeding Baseline vocal quality/speech: Normal Volitional Cough: Able to elicit Volitional Swallow: Able to elicit Exam Limitations: No limitations Goal Planning: Prognosis for improved oropharyngeal function: Good Barriers to Reach Goals: Cognitive deficits; Time post onset No data recorded Patient/Family Stated Goal: none stated Consulted and agree with results and recommendations: Patient; Nurse Pain: Pain Assessment Pain Assessment: No/denies pain End of Session: Start Time:SLP Start Time (ACUTE ONLY): 1232 Stop Time: SLP Stop Time (ACUTE ONLY): 1250 Time Calculation:SLP Time Calculation (min) (ACUTE ONLY): 18 min Charges: SLP Evaluations $ SLP Speech Visit: 1 Visit SLP Evaluations $BSS Swallow: 1 Procedure $MBS Swallow: 1 Procedure SLP visit diagnosis: SLP Visit Diagnosis: Dysphagia, oropharyngeal phase (R13.12) Past Medical History: Past Medical History: Diagnosis Date  Acute gastric ulcer with hemorrhage 05/06/2013  Arthritis   Asthma   Atrial enlargement, left   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   History of MI (myocardial infarction)   History of nuclear stress test 08/07/2010  dipyridamole; EKG negative for ischemia, low risk scan   Hyperlipidemia   Hyperplastic colon polyp 05/2008  Hypertension   Ischemic cardiomyopathy   EF  45%, with inferior wall motion abnormality   Myocardial infarction (HCC)   Valvular regurgitation   mitral and tricuspid (mild) Past Surgical History: Past Surgical History: Procedure Laterality Date  ABDOMINAL AORTOGRAM W/LOWER EXTREMITY N/A 02/14/2023  Procedure: ABDOMINAL AORTOGRAM W/LOWER EXTREMITY;  Surgeon: Philipp Brawn, MD;  Location: [facility] INVASIVE CV LAB;  Service: Cardiovascular;  Laterality: N/A;  AMPUTATION Left 03/22/2023  Procedure: FIFTH TOE AMPUTATION LEFT FOOT;  Surgeon: Philipp Brawn, MD;  Location: [facility] OR;  Service: Vascular;  Laterality: Left;  CARDIOVERSION N/A 08/16/2022  Procedure: CARDIOVERSION;  Surgeon: Hugh Madura, MD;  Location: [facility] INVASIVE CV LAB;  Service: Cardiovascular;  Laterality: N/A;  CORONARY ARTERY BYPASS GRAFT  2000  LIMA to LAD, free IMA to OM2, sequential graft to PLA & PLD  ESOPHAGOGASTRODUODENOSCOPY N/A 05/07/2013  Procedure: ESOPHAGOGASTRODUODENOSCOPY (EGD);  Surgeon: Kenney Peacemaker, MD;  Location[facility] ENDOSCOPY;  Service: Endoscopy;  Laterality: N/A;  PERIPHERAL VASCULAR INTERVENTION Left 02/14/2023  Procedure: PERIPHERAL VASCULAR INTERVENTION;  Surgeon: Philipp Brawn, MD;  Location: [facility] INVASIVE CV LAB;  Service: Cardiovascular;  Laterality: Left;  SFA  TEE WITHOUT CARDIOVERSION N/A 08/16/2022  Procedure: TRANSESOPHAGEAL ECHOCARDIOGRAM;  Surgeon: Hugh Madura, MD;  Location: [facility] INVASIVE CV LAB;  Service: Cardiovascular;  Laterality: N/A;  TRANSTHORACIC ECHOCARDIOGRAM  09/07/2010  EF 45-50%; LV systolic function mildly  reduced; LA mildly dilated; mild-mod MR & mild-mod TR; aortic root sclerosis/calcification;  Amil Kale, M.A., CCC-SLP Speech Language Pathology, Acute Rehabilitation Services Secure Chat preferred [phone] 05/13/2023, 1:52 PM  VAS US  ABI WITH/WO TBI Result Date: 05/13/2023  LOWER EXTREMITY DOPPLER STUDY Patient Name:  ELLIE DEMETRO  Date of Exam:   05/13/2023 Medical Rec #: [id number]       Accession #:    [id number] Date of Birth: [birth year]       Patient Gender: M Patient Age:   7 years Exam Location:  [facility] Procedure:      VAS US  ABI WITH/WO TBI Referring Phys: Runell Countryman --------------------------------------------------------------------------------  Indications: Ulceration. High Risk Factors: Hypertension, hyperlipidemia, Diabetes, past history of                    smoking, prior MI, coronary artery disease. 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  Vascular Interventions: LLE SFA stent placement on 02/14/2023. Comparison Study: Previous exam on 03/18/2023 Performing Technologist: Arlyce Berger RVT, RDMS  Examination Guidelines: A complete evaluation includes at minimum, Doppler waveform signals and systolic blood pressure reading at the level of bilateral brachial, anterior tibial, and posterior tibial arteries, when vessel segments are accessible. Bilateral testing is considered an integral part of a complete examination. Photoelectric Plethysmograph (PPG) waveforms and toe systolic pressure readings are included as required and additional duplex testing as needed. Limited examinations for reoccurring indications may be performed as noted.  ABI Findings: +---------+------------------+-----+--------+---------+ Right    Rt Pressure (mmHg)IndexWaveformComment   +---------+------------------+-----+--------+---------+ Brachial                                PICC line +---------+------------------+-----+--------+---------+ PTA                              biphasicNC        +---------+------------------+-----+--------+---------+ DP  biphasicNC        +---------+------------------+-----+--------+---------+ Great Toe42                0.33 Abnormal          +---------+------------------+-----+--------+---------+ +---------+------------------+-----+--------+-------+ Left     Lt Pressure (mmHg)IndexWaveformComment +---------+------------------+-----+--------+-------+ Brachial 129                                    +---------+------------------+-----+--------+-------+ PTA                             biphasicNC      +---------+------------------+-----+--------+-------+ DP                              biphasicNC      +---------+------------------+-----+--------+-------+ Great Toe0                 0.00 Absent          +---------+------------------+-----+--------+-------+ +-------+-----------+-----------+------------+------------+ ABI/TBIToday's ABIToday's TBIPrevious ABIPrevious TBI +-------+-----------+-----------+------------+------------+ Right  [place]         0.33       [place]          0.65         +-------+-----------+-----------+------------+------------+ Left   [place]         absent     [place]          0.51         +-------+-----------+-----------+------------+------------+ Arterial wall calcification precludes accurate ankle pressures and ABIs.  Summary: Right: Resting right ankle-brachial index indicates noncompressible right lower extremity arteries. The right toe-brachial index is abnormal. Left: Resting left ankle-brachial index indicates noncompressible left lower extremity arteries. The left toe-brachial index is absent. *See table(s) above for measurements and observations.  Electronically signed by Delaney Fearing on 05/13/2023 at 1:39:34 PM.    Final    VAS US  LOWER EXTREMITY ARTERIAL DUPLEX Result Date: 05/13/2023 LOWER EXTREMITY ARTERIAL DUPLEX STUDY Patient Name:  LEONEL WAKLEY   Date of Exam:   05/13/2023 Medical Rec #: [id number]       Accession #:    [id number] Date of Birth: [birth year]       Patient Gender: M Patient Age:   62 years Exam Location:  [facility] Procedure:      VAS US  LOWER EXTREMITY ARTERIAL DUPLEX Referring Phys: Runell Countryman --------------------------------------------------------------------------------  Indications: Ulceration. High Risk Factors: Hypertension, hyperlipidemia, Diabetes, past history of                    smoking, prior MI, coronary artery disease. 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  Vascular Interventions: LLA SFA stent placement on 2/030/2025. Current ABI:            RT [place] / 0.33 LT [place]/absent Limitations: Calcific shadowing, poor ultrasound/tissue interface, patient              movement Comparison Study: Previous exam 03/18/2023 Performing Technologist: Arlyce Berger RVT, RDMS  Examination Guidelines: A complete evaluation includes B-mode imaging, spectral Doppler, color Doppler, and power Doppler as needed of all accessible portions of each vessel. Bilateral testing is considered an integral part of a complete examination. Limited examinations for reoccurring indications may be performed as noted.  +-----------+--------+-----+--------+---------+------------------------+ RIGHT      PSV cm/sRatioStenosisWaveform Comments                 +-----------+--------+-----+--------+---------+------------------------+  CFA Mid    113                  biphasic                          +-----------+--------+-----+--------+---------+------------------------+ CFA Distal 104                  biphasic                          +-----------+--------+-----+--------+---------+------------------------+ DFA        64                   biphasic                          +-----------+--------+-----+--------+---------+------------------------+ SFA Prox   93                   biphasic                           +-----------+--------+-----+--------+---------+------------------------+ SFA Mid    123                  biphasic                          +-----------+--------+-----+--------+---------+------------------------+ SFA Distal 129                  biphasic                          +-----------+--------+-----+--------+---------+------------------------+ POP Prox   98                   biphasic                          +-----------+--------+-----+--------+---------+------------------------+ POP Distal 64                   biphasic                          +-----------+--------+-----+--------+---------+------------------------+ TP Trunk   127                  biphasic                          +-----------+--------+-----+--------+---------+------------------------+ ATA Prox   141                  triphasic                         +-----------+--------+-----+--------+---------+------------------------+ ATA Mid    90                   biphasic                          +-----------+--------+-----+--------+---------+------------------------+ ATA Distal                               not visualized - bandage +-----------+--------+-----+--------+---------+------------------------+ PTA Prox   53  biphasic                          +-----------+--------+-----+--------+---------+------------------------+ PTA Mid    53                   biphasic                          +-----------+--------+-----+--------+---------+------------------------+ PTA Distal 75                   biphasic                          +-----------+--------+-----+--------+---------+------------------------+ PERO Prox  82                   biphasic                          +-----------+--------+-----+--------+---------+------------------------+ PERO Mid   90                   biphasic                           +-----------+--------+-----+--------+---------+------------------------+ PERO Distal81                   biphasic                          +-----------+--------+-----+--------+---------+------------------------+ DP         85                   biphasic                          +-----------+--------+-----+--------+---------+------------------------+  +-----------+--------+-----+---------------+----------+---------------+ LEFT       PSV cm/sRatioStenosis       Waveform  Comments        +-----------+--------+-----+---------------+----------+---------------+ CFA Mid    136                         biphasic                  +-----------+--------+-----+---------------+----------+---------------+ CFA Distal 123                         biphasic                  +-----------+--------+-----+---------------+----------+---------------+ DFA        195          30-49% stenosis          calcific plaque +-----------+--------+-----+---------------+----------+---------------+ SFA Prox   145                         biphasic                  +-----------+--------+-----+---------------+----------+---------------+ SFA Mid    94                          biphasic                  +-----------+--------+-----+---------------+----------+---------------+ POP Prox   95  biphasic                  +-----------+--------+-----+---------------+----------+---------------+ POP Mid                                          calcific plaque +-----------+--------+-----+---------------+----------+---------------+ POP Distal 76                          biphasic                  +-----------+--------+-----+---------------+----------+---------------+ TP Trunk   88                          biphasic                  +-----------+--------+-----+---------------+----------+---------------+ ATA Prox   243          50-74% stenosisbiphasic                   +-----------+--------+-----+---------------+----------+---------------+ ATA Mid    65                          monophasic                +-----------+--------+-----+---------------+----------+---------------+ ATA Distal 65                          monophasic                +-----------+--------+-----+---------------+----------+---------------+ PTA Prox   87                          biphasic                  +-----------+--------+-----+---------------+----------+---------------+ PTA Mid    69                          biphasic                  +-----------+--------+-----+---------------+----------+---------------+ PTA Distal 139                         biphasic                  +-----------+--------+-----+---------------+----------+---------------+ PERO Prox  114                         biphasic                  +-----------+--------+-----+---------------+----------+---------------+ PERO Mid   109                         biphasic                  +-----------+--------+-----+---------------+----------+---------------+ PERO Distal63                          biphasic                  +-----------+--------+-----+---------------+----------+---------------+ DP         178  monophasic                +-----------+--------+-----+---------------+----------+---------------+  Left Stent(s): +----------------+--------+--------+--------+--------+ LLE SFA (distal)PSV cm/sStenosisWaveformComments +----------------+--------+--------+--------+--------+ Prox to Stent   101             biphasic         +----------------+--------+--------+--------+--------+ Proximal Stent  97              biphasic         +----------------+--------+--------+--------+--------+ Mid Stent       105             biphasic         +----------------+--------+--------+--------+--------+ Distal Stent    98              biphasic          +----------------+--------+--------+--------+--------+ Distal to Stent 80              biphasic         +----------------+--------+--------+--------+--------+    Summary: Right: No evidence of significant stenosis seen in the RLE. Significant arterial wall calcifications seen throughout extremity. Left: 30-49% stenosis noted in the deep femoral artery. 50-74% stenosis noted in the proximal anterior tibial artery. Distal SFA stent appears patent wihtout evidence of stenosis. Signifcant arterial wall calcifications seen throughout extremity.  See table(s) above for measurements and observations. Electronically signed by Delaney Fearing on 05/13/2023 at 1:39:10 PM.    Final    US  EKG SITE RITE Result Date: 05/12/2023 If Site Rite image not attached, placement could not be confirmed due to current cardiac rhythm.  CT Chest Wo Contrast Result Date: 05/12/2023 CLINICAL DATA:  Pneumonia with effusion. EXAM: CT CHEST WITHOUT CONTRAST TECHNIQUE: Multidetector CT imaging of the chest was performed following the standard protocol without IV contrast. RADIATION DOSE REDUCTION: This exam was performed according to the departmental dose-optimization program which includes automated exposure control, adjustment of the mA and/or kV according to patient size and/or use of iterative reconstruction technique. COMPARISON:  Chest x-ray 05/12/2023. FINDINGS: Cardiovascular: The heart is enlarged. No substantial pericardial effusion. Status post CABG. Moderate atherosclerotic calcification is noted in the wall of the thoracic aorta. Mediastinum/Nodes: No mediastinal lymphadenopathy. No evidence for gross hilar lymphadenopathy although assessment is limited by the lack of intravenous contrast on the current study. The esophagus has normal imaging features. There is no axillary lymphadenopathy. Lungs/Pleura: Dense consolidative airspace disease is seen in the left upper lobe with left lower lobe collapse/consolidation. Patchy nodular  airspace opacity is seen in the posterior right upper lobe in scattered through the right middle lobe. Dense consolidative airspace disease noted right lower lobe. There is a moderate left pleural effusion with no substantial right-sided pleural fluid collection. Upper Abdomen: Small hypodensities in the liver parenchyma measure up to about 11 mm. These cannot be definitively characterized. Numerous calcified gallstones evident, incompletely visualized. 19 mm exophytic lesion upper interpolar left kidney has been incompletely visualized but approaches water density and is likely a cyst. Musculoskeletal: Bones are diffusely demineralized. Multiple nonacute rib fractures are identified bilaterally T12 compression fracture is age indeterminate but new since abdomen/pelvis CT of 08/19/2022. IMPRESSION: 1. Dense consolidative airspace disease in the left upper lobe with left lower lobe collapse/consolidation. Patchy nodular airspace opacity in the posterior right upper lobe and scattered through the right middle lobe. Dense consolidative airspace disease right lower lobe. Imaging features compatible with multifocal pneumonia. 2. Moderate left pleural effusion. 3. Cholelithiasis. 4. Scattered small low-density liver lesions cannot be definitively characterized on  this noncontrast study. While likely benign, follow-up CT or ultrasound in 3 months recommended to ensure stability. Given the patient's extensive lung disease, immediate follow-up MRI of the abdomen is not recommended due to the potential for substantial imitation by motion artifact. 5. T12 compression fracture is age indeterminate but new since abdomen/pelvis CT of 08/19/2022. 6.  Aortic Atherosclerosis (ICD10-I70.0). Electronically Signed   By: Donnal Fusi M.D.   On: 05/12/2023 06:10   CT HEAD WO CONTRAST Result Date: 05/12/2023 EXAM: CT HEAD WITHOUT 05/12/2023 03:55:56 AM TECHNIQUE: CT of the head was performed without the administration of intravenous  contrast. Automated exposure control, iterative reconstruction, and/or weight based adjustment of the mA/kV was utilized to reduce the radiation dose to as low as reasonably achievable. COMPARISON: 04/25/2023 CLINICAL HISTORY: Mental status change, unknown cause. FINDINGS: BRAIN AND VENTRICLES: There is no acute intracranial hemorrhage, mass effect or midline shift. No abnormal extra-axial fluid collection. The gray-white differentiation is maintained without evidence of an acute infarct. There is no evidence of hydrocephalus. Global cortical atrophy. Subcortical and periventricular small vessel ischemic changes. ORBITS: The visualized portion of the orbits demonstrate no acute abnormality. SINUSES: The visualized paranasal sinuses and mastoid air cells demonstrate no acute abnormality. SOFT TISSUES AND SKULL: No acute abnormality of the visualized skull or soft tissues. IMPRESSION: 1. No acute intracranial abnormality. 2. Atrophy with small vessel ischemic changes. Electronically signed by: Zadie Herter MD 05/12/2023 03:59 AM EDT RP Workstation: [id number]   DG Foot 2 Views Left Result Date: 05/12/2023 EXAM: 2 VIEW(S) XRAY OF THE LEFT FOOT 05/12/2023 03:46:00 AM COMPARISON: None available. CLINICAL HISTORY: Possible osteomyelitis. Encounter for altered mental status, possible osteomyelitis. 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 Destructive changes involving the fifth metatarsal head, suggesting acute osteomyelitis. Small plantar and posterior calcaneal osteophytes. SOFT TISSUES: Adjacent soft tissue swelling/ulcer. Vascular calcifications. IMPRESSION: 1. Destructive changes involving the left fifth metatarsal head, suggesting acute osteomyelitis, with adjacent soft tissue swelling/ulcer. 2. 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 Electronically signed by: Zadie Herter MD 05/12/2023 03:50 AM EDT RP Workstation:  [id number]   DG Chest Port 1 View Result Date: 05/12/2023 EXAM: 1 VIEW XRAY OF THE CHEST 05/12/2023 03:45:00 AM COMPARISON: 04/25/2023 CLINICAL HISTORY: Altered mental status. Encounter for altered mental status, septic foot, ulcer on the side and swollen. FINDINGS: LUNGS AND PLEURA: Near complete opacification of the left hemithorax, favoring a moderate to large left pleural effusion with underlying atelectasis, although obscured by defibrillator pads. The right lung is clear. HEART AND MEDIASTINUM: Postsurgical changes related to prior CABG. Median sternotomy. BONES AND SOFT TISSUES: No acute osseous abnormality. IMPRESSION: 1. Near complete opacification of the left hemithorax, favoring a moderate to large left pleural effusion with underlying atelectasis, although obscured. Electronically signed by: Zadie Herter MD 05/12/2023 03:49 AM EDT RP Workstation: [id number]   CT Head Wo Contrast Result Date: 04/25/2023 CLINICAL DATA:  Level II trauma.  Blunt poly trauma. EXAM: CT HEAD WITHOUT CONTRAST CT CERVICAL SPINE WITHOUT CONTRAST TECHNIQUE: Multidetector CT imaging of the head and cervical spine was performed following the standard protocol without intravenous contrast. Multiplanar CT image reconstructions of the cervical spine were also generated. RADIATION DOSE REDUCTION: This exam was performed according to the departmental dose-optimization program which includes automated exposure control, adjustment of the mA and/or kV according to patient size and/or use of iterative reconstruction technique. COMPARISON:  Head CT 11/16/2022. FINDINGS: CT HEAD FINDINGS Brain: There  is no evidence for acute hemorrhage, hydrocephalus, mass lesion, or abnormal extra-axial fluid collection. No definite CT evidence for acute infarction. Diffuse loss of parenchymal volume is consistent with atrophy. Patchy low attenuation in the deep hemispheric and periventricular white matter is nonspecific, but likely reflects chronic  microvascular ischemic demyelination. Stable chronic infarct right basal ganglia. Tiny lacunar infarcts are seen in the left cerebellum. Vascular: No hyperdense vessel or unexpected calcification. Skull: No evidence for fracture. No worrisome lytic or sclerotic lesion. Sinuses/Orbits: Trace chronic mucosal disease posterior right ethmoid air cells. The visualized paranasal sinuses and mastoid air cells are otherwise clear. Visualized portions of the globes and intraorbital fat are unremarkable. Other: None CT CERVICAL SPINE FINDINGS Alignment: No evidence for traumatic subluxation. Skull base and vertebrae: No acute fracture. No primary bone lesion or focal pathologic process. Soft tissues and spinal canal: No prevertebral fluid or swelling. No visible canal hematoma. Disc levels: There is diffuse loss of disc height in the cervical spine, most prominent at C5-6. Facets are well aligned bilaterally. Upper chest: Pleural fluid visible in the left lung apex suggest the presence of a large left pleural effusion. Other: None. IMPRESSION: 1. No acute intracranial abnormality. 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 2. Atrophy with chronic small vessel white matter ischemic disease. 3. No evidence for cervical spine fracture or traumatic subluxation. 4. Pleural fluid visible in the left lung apex suggest the presence of a large left pleural effusion. Electronically Signed   By: Donnal Fusi M.D.   On: 04/25/2023 05:24   CT Cervical Spine Wo Contrast Result Date: 04/25/2023 CLINICAL DATA:  Level II trauma.  Blunt poly trauma. EXAM: CT HEAD WITHOUT CONTRAST CT CERVICAL SPINE WITHOUT CONTRAST TECHNIQUE: Multidetector CT imaging of the head and cervical spine was performed following the standard protocol without intravenous contrast. Multiplanar CT image reconstructions of the cervical spine were also generated. RADIATION DOSE REDUCTION: This exam was performed according to the  departmental dose-optimization program which includes automated exposure control, adjustment of the mA and/or kV according to patient size and/or use of iterative reconstruction technique. COMPARISON:  Head CT 11/16/2022. FINDINGS: CT HEAD FINDINGS Brain: There is no evidence for acute hemorrhage, hydrocephalus, mass lesion, or abnormal extra-axial fluid collection. No definite CT evidence for acute infarction. Diffuse loss of parenchymal volume is consistent with atrophy. Patchy low attenuation in the deep hemispheric and periventricular white matter is nonspecific, but likely reflects chronic microvascular ischemic demyelination. Stable chronic infarct right basal ganglia. Tiny lacunar infarcts are seen in the left cerebellum. Vascular: No hyperdense vessel or unexpected calcification. Skull: No evidence for fracture. No worrisome lytic or sclerotic lesion. Sinuses/Orbits: Trace chronic mucosal disease posterior right ethmoid air cells. The visualized paranasal sinuses and mastoid air cells are otherwise clear. Visualized portions of the globes and intraorbital fat are unremarkable. Other: None CT CERVICAL SPINE FINDINGS Alignment: No evidence for traumatic subluxation. Skull base and vertebrae: No acute fracture. No primary bone lesion or focal pathologic process. Soft tissues and spinal canal: No prevertebral fluid or swelling. No visible canal hematoma. Disc levels: There is diffuse loss of disc height in the cervical spine, most prominent at C5-6. Facets are well aligned bilaterally. Upper chest: Pleural fluid visible in the left lung apex suggest the presence of a large left pleural effusion. Other: None. IMPRESSION: 1. No acute intracranial abnormality. 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 2. Atrophy with chronic small vessel white matter ischemic disease. 3.  No evidence for cervical spine fracture or traumatic subluxation. 4. Pleural fluid visible in the left lung  apex suggest the presence of a large left pleural effusion. Electronically Signed   By: Donnal Fusi M.D.   On: 04/25/2023 05:24   DG Pelvis Portable Result Date: 04/25/2023 CLINICAL DATA:  Trauma due to fall EXAM: PORTABLE PELVIS 1 VIEWS COMPARISON:  None Available. FINDINGS: Generalized osteopenia. No evidence of fracture or hip dislocation. No pelvic ring diastasis. IMPRESSION: No acute finding. Electronically Signed   By: Ronnette Coke M.D.   On: 04/25/2023 05:15   DG Chest Portable 1 View Result Date: 04/25/2023 CLINICAL DATA:  Trauma with fall EXAM: PORTABLE CHEST 1 VIEW COMPARISON:  04/14/2023 FINDINGS: Cardiomegaly with vascular pedicle widening.  There has been CABG. Congested appearance of vessels with hazy density at the left base likely reflecting scar. No pneumothorax. No acute osseous finding. IMPRESSION: Stable cardiomegaly and vascular congestion. Electronically Signed   By: Ronnette Coke M.D.   On: 04/25/2023 05:13    Microbiology Recent Results (from the past 240 hours)  Culture, blood (routine x 2)     Status: None   Collection Time: 05/12/23  3:28 AM   Specimen: BLOOD RIGHT HAND  Result Value Ref Range Status   Specimen Description BLOOD RIGHT HAND  Final   Special Requests   Final    BOTTLES DRAWN AEROBIC AND ANAEROBIC Blood Culture adequate volume   Culture   Final    NO GROWTH 5 DAYS Performed at [facility], [address], [place], [place] [address]    Report Status 05/17/2023 FINAL  Final  Expectorated Sputum Assessment w Gram Stain, Rflx to Resp Cult     Status: None   Collection Time: 05/12/23 10:46 AM   Specimen: Expectorated Sputum  Result Value Ref Range Status   Specimen Description EXPECTORATED SPUTUM  Final   Special Requests NONE  Final   Sputum evaluation   Final    THIS SPECIMEN IS ACCEPTABLE FOR SPUTUM CULTURE Performed at [facility], [address] [place], [place] [address]    Report Status 05/12/2023 FINAL  Final   Culture, Respiratory w Gram Stain     Status: None   Collection Time: 05/12/23 10:46 AM  Result Value Ref Range Status   Specimen Description EXPECTORATED SPUTUM  Final   Special Requests NONE Reflexed from [id number]  Final   Gram Stain   Final    MODERATE WBC PRESENT, PREDOMINANTLY PMN RARE GRAM POSITIVE RODS RARE YEAST WITH PSEUDOHYPHAE RARE GRAM POSITIVE COCCI IN CHAINS Performed at [facility], [address], [place], [place] [address]    Culture RARE CANDIDA ALBICANS  Final   Report Status 05/15/2023 FINAL  Final  MRSA Next Gen by PCR, Nasal     Status: None   Collection Time: 05/12/23 10:52 AM   Specimen: Nasal Mucosa; Nasal Swab  Result Value Ref Range Status   MRSA by PCR Next Gen NOT DETECTED NOT DETECTED Final    Comment: (NOTE) The GeneXpert MRSA Assay (FDA approved for NASAL specimens only), is one component of a comprehensive MRSA colonization surveillance program. It is not intended to diagnose MRSA infection nor to guide or monitor treatment for MRSA infections. Test performance is not FDA approved in patients less than 34 years old. Performed at [facility], [address] [place], [place] [address]   Culture, blood (routine x 2)     Status: None   Collection  Time: 05/12/23 12:32 PM   Specimen: BLOOD  Result Value Ref Range Status   Specimen Description BLOOD SITE NOT SPECIFIED  Final   Special Requests   Final    BOTTLES DRAWN AEROBIC AND ANAEROBIC Blood Culture results may not be optimal due to an inadequate volume of blood received in culture bottles   Culture   Final    NO GROWTH 5 DAYS Performed at [facility], [address] [place], [place] [address]    Report Status 05/17/2023 FINAL  Final  Body fluid culture w Gram Stain     Status: None   Collection Time: 05/14/23  5:08 PM   Specimen: Pleural Fluid  Result Value Ref Range Status   Specimen Description PLEURAL  Final   Special Requests LEFT  Final   Gram Stain RARE WBC  SEEN NO ORGANISMS SEEN   Final   Culture   Final    NO GROWTH 3 DAYS Performed at [facility], [address] [place], [place] [address]    Report Status 05/17/2023 FINAL  Final  Fungus Culture With Stain     Status: None (Preliminary result)   Collection Time: 05/14/23  5:08 PM   Specimen: Pleural, Left; Pleural Fluid  Result Value Ref Range Status   Fungus Stain Final report  Final    Comment: (NOTE) Performed At: [facility] [address] [place], [place] [id number] Pearlean Botts MD [id number]    Fungus (Mycology) Culture PENDING  Incomplete   Fungal Source PLEURAL  Final    Comment: LEFT Performed at [facility], [address] [place], [place] [address]   Fungus Culture Result     Status: None   Collection Time: 05/14/23  5:08 PM  Result Value Ref Range Status   Result 1 Comment  Final    Comment: (NOTE) KOH/Calcofluor preparation:  no fungus observed. Performed At: [facility] [address] [place], [place] [id number] Pearlean Botts MD [id number]     Lab Basic Metabolic Panel: Recent Labs  Lab 05/14/23 0147 05/15/23 0418 Jun 04, 2023 0435 04-Jun-2023 1529 Jun 04, 2023 1535  NA 132* 139 139 141  --   K 4.1 3.7 4.3 4.1  --   CL 101 106 109  --   --   CO2 20* 24 23  --   --   GLUCOSE 286* 89 118*  --   --   BUN 15 21 28*  --   --   CREATININE 1.04 1.12 1.38*  --   --   CALCIUM  7.6* 8.2* 8.4*  --   --   MG  --  2.2  --   --  2.4  PHOS  --   --   --   --  3.0   Liver Function Tests: Recent Labs  Lab 05/14/23 1913  PROT 5.1*  ALBUMIN  3.1*   No results for input(s): "LIPASE", "AMYLASE" in the last 168 hours. No results for input(s): "AMMONIA" in the last 168 hours. CBC: Recent Labs  Lab 05/14/23 0147 05/14/23 1913 05/15/23 0418 Jun 04, 2023 0435 Jun 04, 2023 1529  WBC 25.3* 21.3* 17.8* 12.1*  --   NEUTROABS  --  19.9*  --   --   --   HGB 6.6* 8.3* 8.2* 7.8* 7.8*  HCT 21.9* 25.9* 25.6* 24.7* 23.0*  MCV 98.6 91.5 90.5  91.8  --   PLT 223 155 125* 90*  --    Cardiac Enzymes: No results for input(s): "CKTOTAL", "CKMB", "CKMBINDEX", "TROPONINI" in  the last 168 hours. Sepsis Labs: Recent Labs  Lab 05/14/23 0147 05/14/23 1913 05/15/23 0418 05/15/23 1104 May 21, 2023 0435  PROCALCITON  --   --   --  8.99  --   WBC 25.3* 21.3* 17.8*  --  12.1*    Procedures/Operations  Thoracentesis    Wilfredo Hanly 05/20/2023, 6:10 PM

## 2023-06-12 NOTE — Progress Notes (Signed)
 Patient ID: DODGER SHY, male   DOB: August 25, 1940, 83 y.o.   MRN: [id number]    Progress Note from the Palliative Medicine Team at [facility]   Patient Name: Donald Rios        Date: 06/09/2023 DOB: [birth year]  Age: 83 y.o. MRN#: [id number] Attending Physician: Arlina Lair, MD Primary Care Physician: Austine Lefort, MD Admit Date: 05/12/2023   Reason for Consultation/Follow-up   Establishing Goals of Care   HPI/ Brief Hospital Review  83 y.o. male  with past medical history of CAD status post CABG, COPD, diabetes mellitus, ischemic cardiomyopathy with chronic HFrEF, and atrial fibrillation, admitted on 05/12/2023 with lethargy.    CT Chest concerning for L hemithorax opacification,  concerning for multifocal PNA.    He was recently admitted 04/12/23 to [facility] for sepsis secondary to left lower extremity cellulitis.,  complicated by hypotension which was felt to be secondary to hypovolemia, sepsis, or adrenal insufficiency. He was started on midodrine  and fludrocortisone  with improvement. He was treated for cellulitis with antibiotics and was discharged to SNF. He has since been discharged from SNF to home   Family face treatment option decisions, advanced directive decisions and anticipatory care needs.   Subjective  Extensive chart review has been completed including labs, vital signs, imaging, progress/consult notes, orders, medications and available advance directive documents.    This NP assessed patient at the bedside and met with his wife for ongoing conversation regarding current medical situation and to assess for palliative medicine needs and emotional support.  Today is day 4 of this hospitalization and patient is having poor response to full medical support.    Patient remains critically ill within the context of full medical support.  He is high risk for ongoing decompensation.  Created space for wife to explore her thoughts and feelings regarding her  husband's current  medical situation..   Wife is tearful as she acknowledges the seriousness of the situation.  She understands he is high risk for need for intubation.    Education offered regarding the ability of family to make decisions regarding health care decisions for a loved one.   Education offered on the difference between a full medical support path verses a path focused more on comfort, limiting life prolonging measures specific to intubation.  Wife understands and is considering her decisions.     Education offered today regarding  the importance of continued conversation with family and their  medical providers regarding overall plan of care and treatment options,  ensuring decisions are within the context of the patients values and GOCs.  Questions and concerns addressed    PMT will continue to support holistically    Discussed with  nursing staff   Time: 50   minutes  Detailed review of medical records ( labs, imaging, vital signs), medically appropriate exam ( MS, skin, cardiac,  resp)   discussed with treatment team, counseling and education to patient, family, staff, documenting clinical information, medication management, coordination of care    Thena Fireman NP  Palliative Medicine Team Team Phone # [phone] Pager [phone]

## 2023-06-12 NOTE — Progress Notes (Signed)
 Seen on morning rounds.  Patient still in significant hypoxemic respiratory failure, with labored breathing on high flow nasal cannula.  His lower extremity wounds appear stable to me.  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  He will benefit from angiography but this should be delayed until he is much better medically.  I will check in again on Thursday unless the situation changes.  Please call for any questions in the interim.  Donald Rios. [name], MD [facility] Phone Number: [phone] 05/29/2023 10:34 AM

## 2023-06-12 NOTE — Plan of Care (Signed)
  Problem: Education: Goal: Ability to describe self-care measures that may prevent or decrease complications (Diabetes Survival Skills Education) will improve Outcome: Not Progressing Goal: Individualized Educational Video(s) Outcome: Not Progressing   Problem: Coping: Goal: Ability to adjust to condition or change in health will improve Outcome: Not Progressing   Problem: Fluid Volume: Goal: Ability to maintain a balanced intake and output will improve Outcome: Not Progressing   Problem: Health Behavior/Discharge Planning: Goal: Ability to manage health-related needs will improve Outcome: Not Progressing   Problem: Nutritional: Goal: Maintenance of adequate nutrition will improve Outcome: Not Progressing Goal: Progress toward achieving an optimal weight will improve Outcome: Not Progressing

## 2023-06-12 NOTE — Progress Notes (Signed)
 Echocardiogram 2D Echocardiogram has been performed.  Donald Rios 05/14/2023, 11:52 AM

## 2023-06-12 NOTE — Progress Notes (Addendum)
 NAME:  Donald Rios, MRN:  [id number], DOB:  [birth year], LOS: 4 ADMISSION DATE:  05/12/2023, CONSULTATION DATE: 5/1 REFERRING MD: Dr. Nichole Barker, CHIEF COMPLAINT: Hypotension  History of Present Illness:  83 year old male with past medical history as below, which is significant for CAD status post CABG, COPD, diabetes mellitus, ischemic cardiomyopathy with chronic HFrEF, and atrial fibrillation on Eliquis .  He was recently admitted 4/1 to [facility] for sepsis secondary to left lower extremity cellulitis.  Course was complicated by hypotension which was felt to be secondary to hypovolemia, sepsis, or adrenal insufficiency.  He was started on midodrine  and fludrocortisone  with improvement.  He was treated for cellulitis with antibiotics and was discharged to SNF.  He has since been discharged from SNF to home.  He was in his usual state of health until approximately 4/30 when he became lethargic around 2 PM.  EMS was called and he was found to be hypoglycemic to 60 which improved with dextrose  administration.  He was transported to [facility] emergency department for further evaluation.  Initially upon arrival he was minimally responsive, but improved over the course of his stay in the ED.  He was hypoxemic to the mid 80s on room air and hypotensive.  Imaging of the chest was concerning for L hemithorax opacification and CT was done showing moderal left pleural effusion and atelectasis vs pneumonia as well as a right sided pneumonia. He was placed on BiPAP for hypoxia and started on empiric antibiotics. He was admitted to the hospitalists. Despite BiPAP he remained somnolent. And hypotension persisted as well. PCCM was asked to evaluate for ICU transfer.   Pertinent  Medical History   has a past medical history of Acute gastric ulcer with hemorrhage (05/06/2013), Arthritis, Asthma, Atrial enlargement, left, 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, Cataract, COPD (chronic obstructive pulmonary disease)  (HCC), Diabetes mellitus, Diabetic nephropathy (HCC), Diverticulosis, ED (erectile dysfunction), GERD (gastroesophageal reflux disease), Gout, Hearing loss, Hiatal hernia (05/2008), History of MI (myocardial infarction), History of nuclear stress test (08/07/2010), Hyperlipidemia, Hyperplastic colon polyp (05/2008), Hypertension, Ischemic cardiomyopathy, Myocardial infarction (HCC), and Valvular regurgitation.   Significant Hospital Events: Including procedures, antibiotic start and stop dates in addition to other pertinent events   5/2 admitted for respiratory failure and pneumonia, shock 5/3 remained on levophed , O2 requirements increased overnight - placed on Bipap  Interim History / Subjective:   Used bipap overnight, currently on 8L [place].   Objective   Blood pressure 133/61, pulse 91, temperature 97.9 F (36.6 C), temperature source Axillary, resp. rate (!) 25, height 5\' 8"  (1.727 m), weight 58.1 kg, SpO2 100%.    Vent Mode: PCV;BIPAP FiO2 (%):  [40 %-50 %] 40 % Set Rate:  [15 bmp] 15 bmp   Intake/Output Summary (Last 24 hours) at 05/21/2023 6213 Last data filed at 06/06/2023 0600 Gross per 24 hour  Intake 1513.48 ml  Output 550 ml  Net 963.48 ml   Filed Weights   05/12/23 0403 05/12/23 1052 05/13/23 0511  Weight: 64 kg 58 kg 58.1 kg   Examination: General: Elderly, chronically ill appearing male, mild respiratory distress HENT: [place]/AT, PERRL, JVD appreciated Lungs: course breath sounds, crackles present  Cardiovascular: irrgularly irregular rate. No MRG Abdomen: Soft, NT, ND Extremities: Multiple ulcerations on left foot and well and R lateral lower extremity . 2+ pitting edema to bilateral lower extremities.  Neuro: Alert, oriented, non-focal  Resolved Hospital Problem list   Shock  Assessment & Plan:   Acute respiratory failure: multifocal  pneumonia + pleural effusion. - PRN Bipap, currently on 8L nasal cannula - Keep sats > 90% - Covering with Cefepime  for multifocal  pneumonia and osteomyelitis - s/p thoracentesis 5/3 with 1.1L removed. Exudative based on LDH greater than 2/3 upper limit of normal. Neutrophil predominant. 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  - WOC consult  Chronic HFrEF: LVEF 25% secondary to ischemic cardiomyopathy - hold home metoprolol  and spironolactone  for now - check trop - Increase lasix  to 80mg  IV BID  Atrial fibrillation - Amiodarone  continue - transition to eliquis  2.5mg  BID   Normocytic anemia - Hemoglobin at his recent baseline - trend CBC - Transfuse for hemoglobin < 7  DM - CBG monitoring and SSI  Poor Nutrition Placing Cortrak today    Best Practice (right click and "Reselect all SmartList Selections" daily)   Diet/type: dysphagia diet (see orders) DVT prophylaxis LMWH  Pressure ulcer(s): identified on: 01/May/2025 sacral and foot present on admission GI prophylaxis: N/A Lines: Central line Foley:  N/A Code Status:  full code Last date of multidisciplinary goals of care discussion [ palliative met with family yesterday, remains full code]  Labs   CBC: Recent Labs  Lab 05/12/23 0327 05/12/23 0756 05/13/23 0450 05/14/23 0147 05/14/23 1913 05/15/23 0418 05/14/2023 0435  WBC 9.1  --  36.2* 25.3* 21.3* 17.8* 12.1*  NEUTROABS 7.9*  --   --   --  19.9*  --   --   HGB 7.7*   < > 7.0* 6.6* 8.3* 8.2* 7.8*  HCT 25.5*   < > 22.6* 21.9* 25.9* 25.6* 24.7*  MCV 98.1  --  96.6 98.6 91.5 90.5 91.8  PLT 214  --  236 223 155 125* 90*   < > = values in this interval not displayed.    Basic Metabolic Panel: Recent Labs  Lab 05/12/23 0530 05/12/23 0756  05/12/23 1355 05/13/23 0450 05/14/23 0147 05/15/23 0418 05/31/2023 0435  NA  --    < > 136 128* 132* 139 139  K  --    < > 3.2* 3.5 4.1 3.7 4.3  CL  --   --  102 98 101 106 109  CO2  --   --  23 20* 20* 24 23  GLUCOSE  --   --  163* 420* 286* 89 118*  BUN  --   --  9 11 15 21  28*  CREATININE  --   --  1.10 1.02 1.04 1.12 1.38*  CALCIUM   --   --  7.2* 6.7* 7.6* 8.2* 8.4*  MG 0.8*  --  3.1* 2.3  --  2.2  --   PHOS  --   --  2.2* 2.8  --   --   --    < > = values in this interval not displayed.   GFR: Estimated Creatinine Clearance: 34.5 mL/min (A) (by C-G formula based on SCr of 1.38 mg/dL (H)). Recent Labs  Lab 05/12/23 0334 05/12/23 0530 05/12/23 0541 05/13/23 0450 05/14/23 0147 05/14/23 1913 05/15/23 0418 05/15/23 1104 05/18/2023 0435  PROCALCITON  --  <0.10  --   --   --   --   --  8.99  --  WBC  --   --   --    < > 25.3* 21.3* 17.8*  --  12.1*  LATICACIDVEN 1.4  --  0.6  --   --   --   --   --   --    < > = values in this interval not displayed.    Liver Function Tests: Recent Labs  Lab 05/12/23 0327 05/12/23 1355 05/14/23 1913  AST 24 32  --   ALT 13 13  --   ALKPHOS 81 75  --   BILITOT 1.1 0.9  --   PROT 4.0* 4.0* 5.1*  ALBUMIN  1.7* 1.6* 3.1*   No results for input(s): "LIPASE", "AMYLASE" in the last 168 hours. Recent Labs  Lab 05/12/23 1355  AMMONIA 13    ABG    Component Value Date/Time   PHART 7.398 05/12/2023 0756   PCO2ART 39.5 05/12/2023 0756   PO2ART 85 05/12/2023 0756   HCO3 24.4 05/12/2023 0756   TCO2 26 05/12/2023 0756   O2SAT 96 05/12/2023 0756     Coagulation Profile: No results for input(s): "INR", "PROTIME" in the last 168 hours.  Cardiac Enzymes: No results for input(s): "CKTOTAL", "CKMB", "CKMBINDEX", "TROPONINI" in the last 168 hours.  HbA1C: Hgb A1c MFr Bld  Date/Time Value Ref Range Status  05/13/2023 05:00 AM 5.0 4.8 - 5.6 % Final    Comment:    (NOTE) Pre diabetes:          5.7%-6.4%  Diabetes:               >6.4%  Glycemic control for   <7.0% adults with diabetes   02/08/2023 10:52 PM 6.2 (H) 4.8 - 5.6 % Final    Comment:    (NOTE) Pre diabetes:          5.7%-6.4%  Diabetes:              >6.4%  Glycemic control for   <7.0% adults with diabetes     CBG: Recent Labs  Lab 05/15/23 1544 05/15/23 1940 05/15/23 2327 05/13/2023 0253 05/18/2023 0726  GLUCAP 138* 116* 120* 119* 98    Critical care time: 45 mins    Chrissie Coupe, MD  [facility] Internal Medicine Resident, PGY-2   See Amion for personal pager PCCM on call pager [phone] until 7pm. Please call Elink 7p-7a. [phone]

## 2023-06-12 NOTE — Progress Notes (Signed)
 Spiritual care contacted for support at this time.

## 2023-06-12 NOTE — Progress Notes (Signed)
   05/18/2023 2010  Spiritual Encounters  Type of Visit Initial  Care provided to: [facility] partners present during encounter Nurse  Referral source Nurse (RN/NT/LPN)  Reason for visit End-of-life  OnCall Visit No   Chaplain responded to a call for support. I met with Donald Rios's family who were all grieving. Present was his wife, daughter and sisters. His wife thanked me for coming and asked for prayer. I stayed for a period of time with them,  being quiet and listening. If a chaplain is requested someone will respond.  Donald Rios [facility]  [phone]

## 2023-06-12 NOTE — Progress Notes (Signed)
 Patient passed 2203. Confirmation of death by this Clinical research associate and Renay Carota, Charity fundraiser. MD coverage notified. [employer] notified. Family communication and plan following this event explained to spouse and family at bedside. All questions answered. Family has left the unit at this time

## 2023-06-12 DEATH — deceased

## 2023-06-13 LAB — FUNGUS CULTURE WITH STAIN

## 2023-06-13 LAB — FUNGAL ORGANISM REFLEX

## 2023-06-13 LAB — FUNGUS CULTURE RESULT

## 2023-07-26 ENCOUNTER — Ambulatory Visit: Admitting: Internal Medicine

## 2023-07-28 ENCOUNTER — Ambulatory Visit: Payer: Medicare Other

## 2023-08-17 IMAGING — US US RENAL
1 series · 14 of 25 positions shown · non-contrast
Comparison: None.

CLINICAL DATA: Renal dysfunction

EXAM:
RENAL / URINARY TRACT ULTRASOUND COMPLETE

[Series 1: us renal · 0.23mm/px · 14 of 41 slices shown]
[im 1/41]
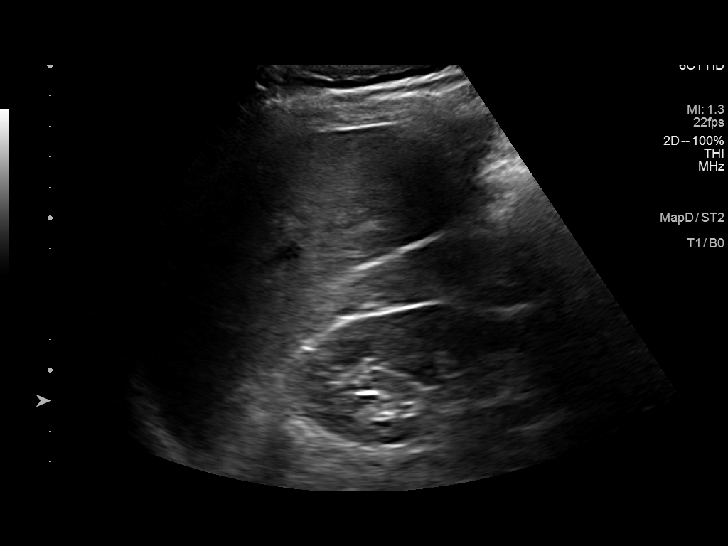
[im 4/41]
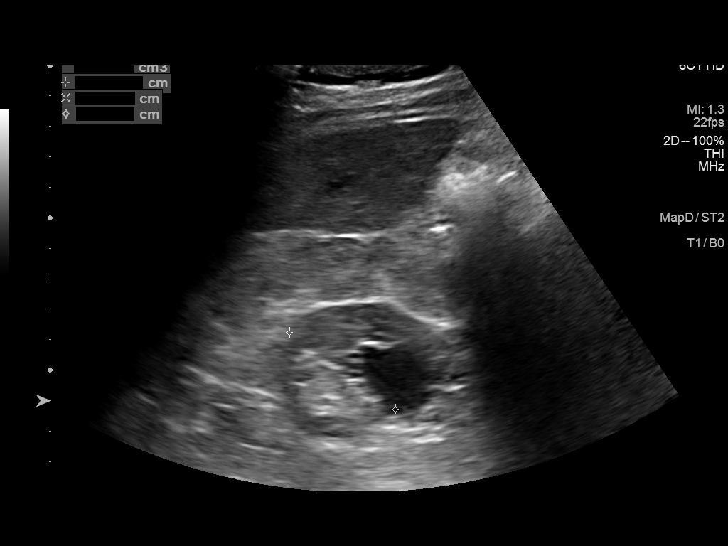
[im 7/41]
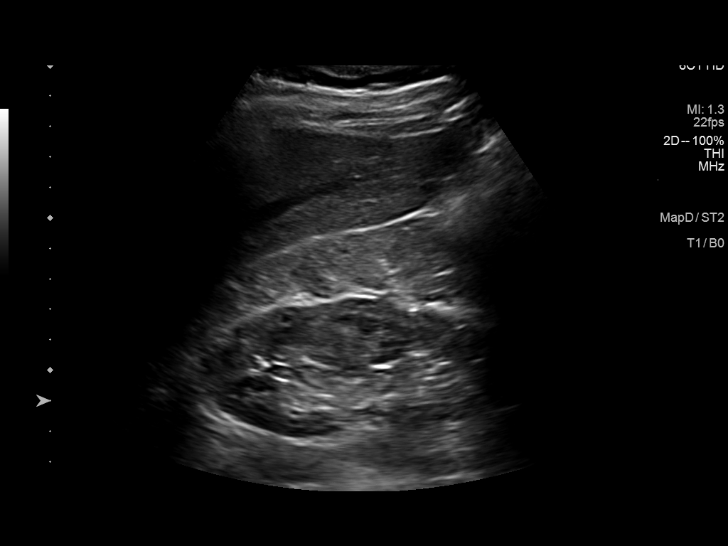
[im 11/41]
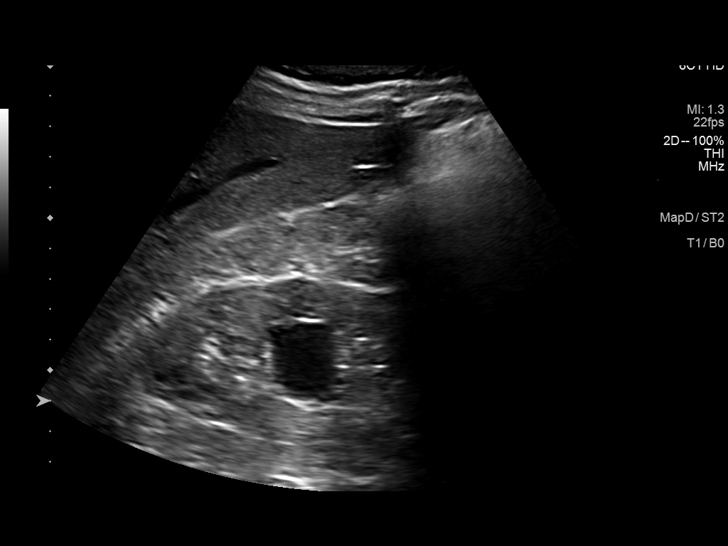
[im 14/41]
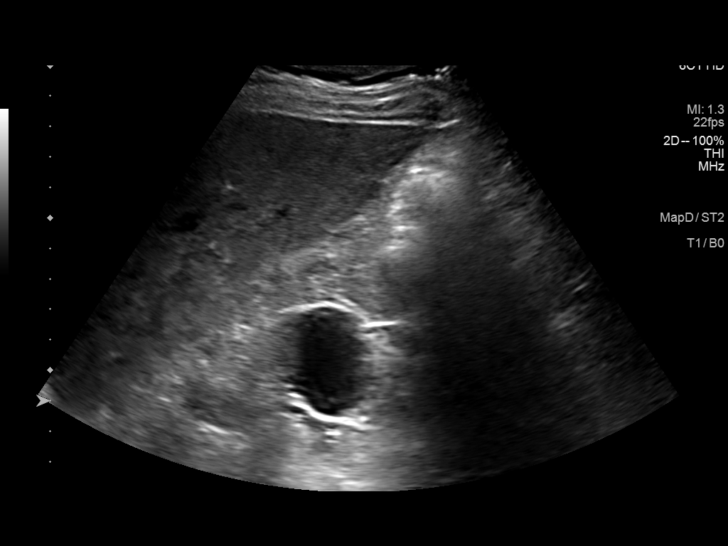
[im 16/41]
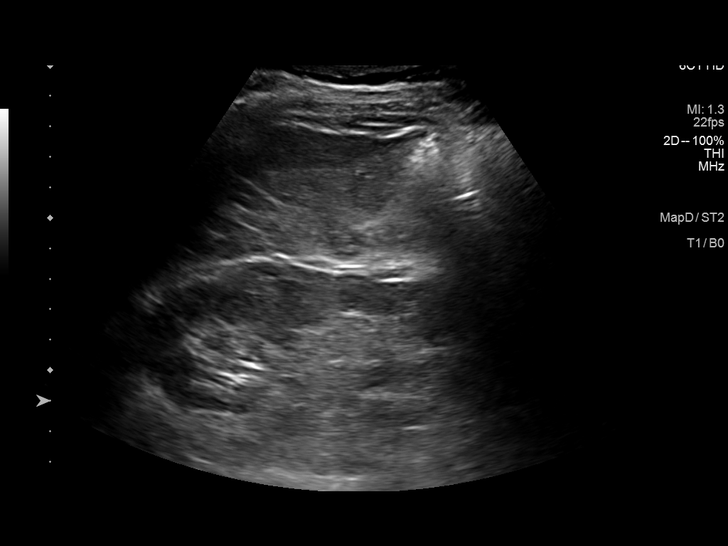
[im 19/41]
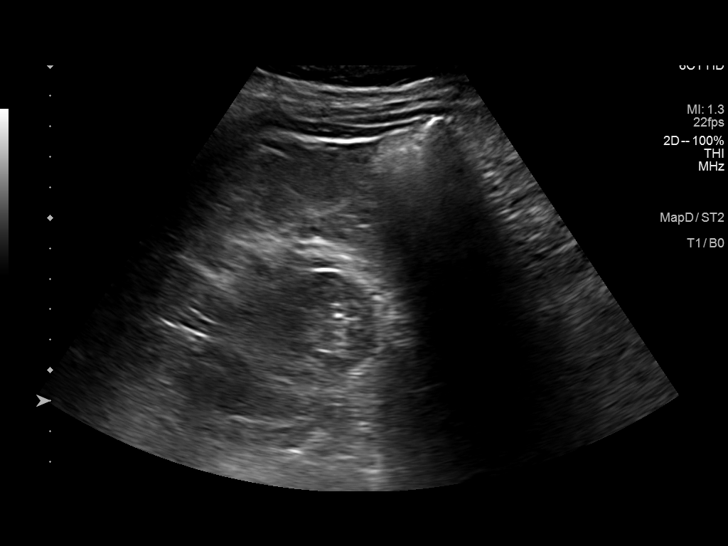
[im 22/41]
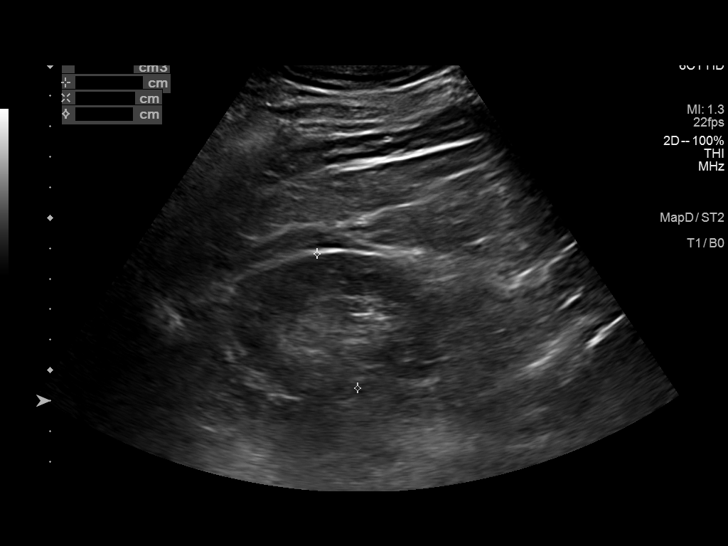
[im 26/41]
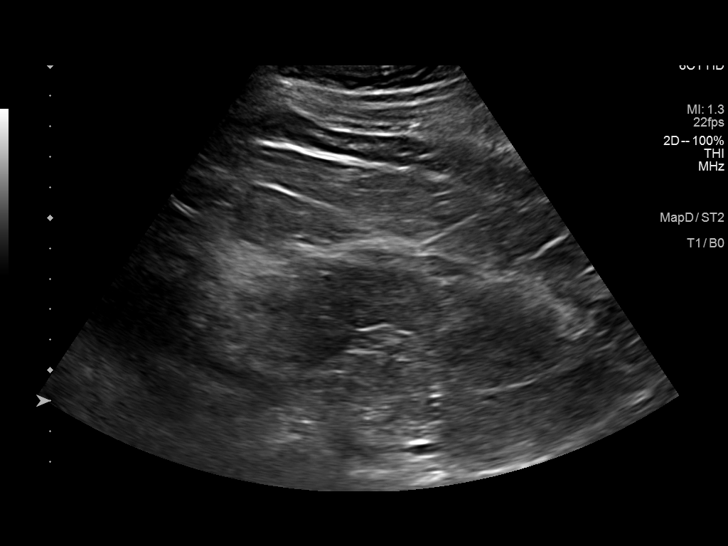
[im 27/41]
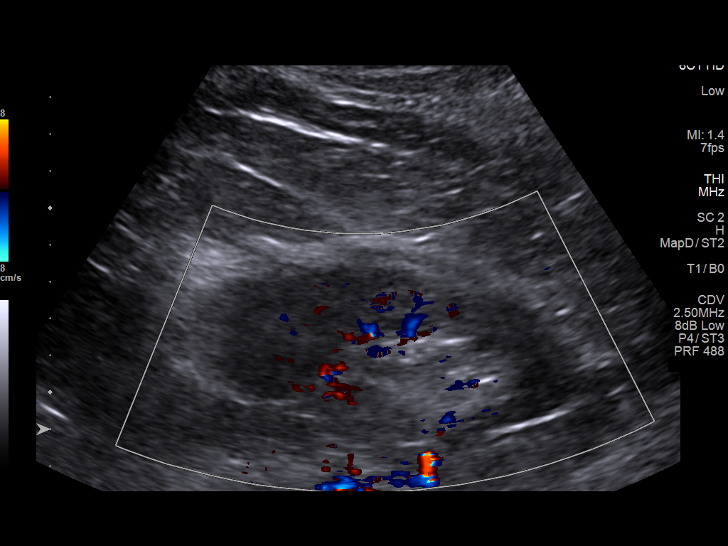
[im 31/41]
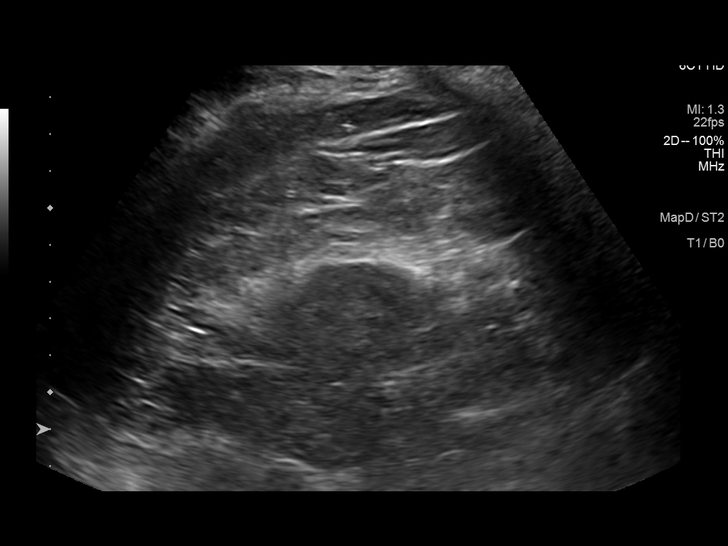
[im 34/41]
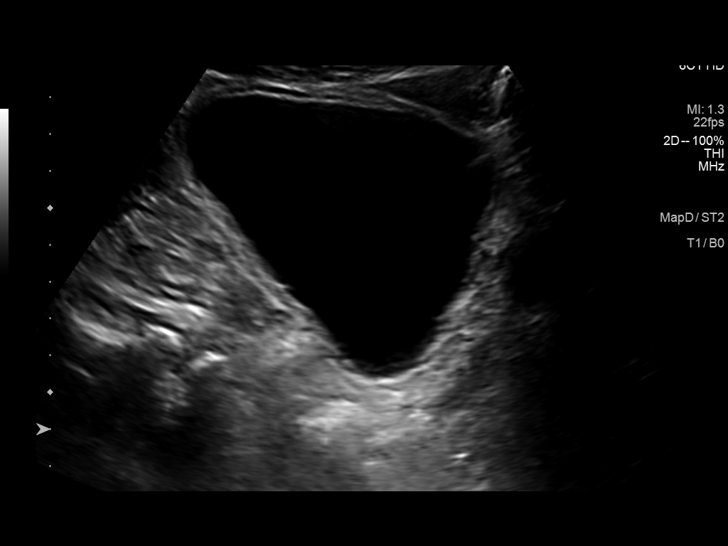
[im 37/41]
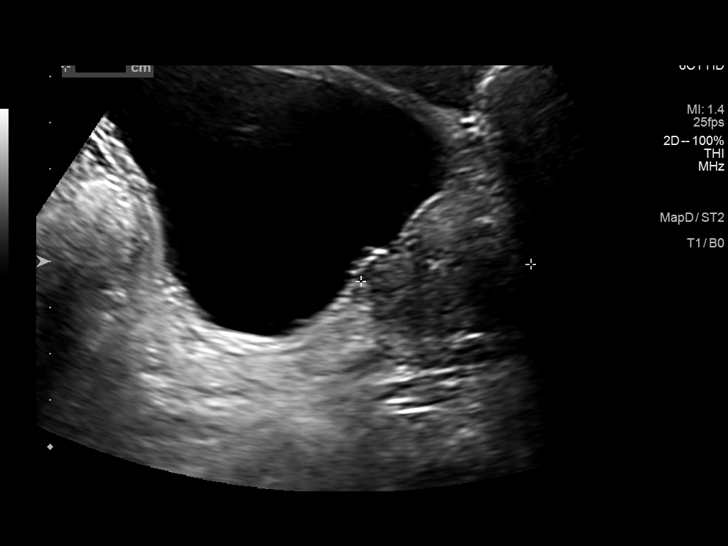
[im 41/41]
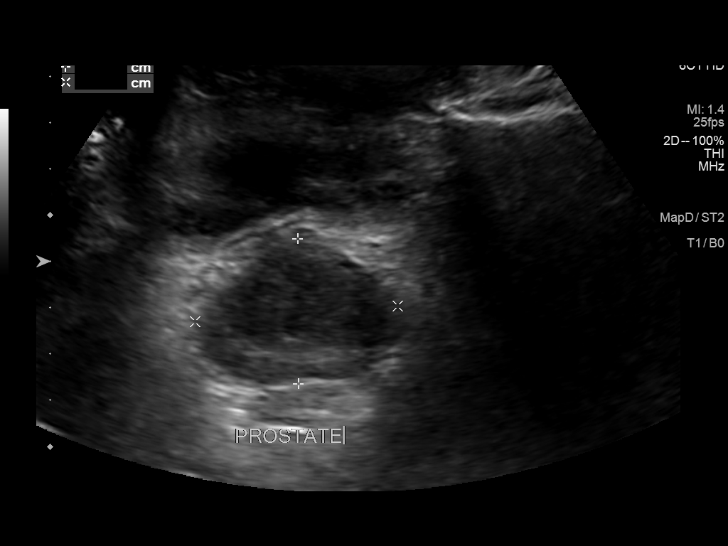

[14 of 25 positions shown; findings below may reference images not displayed]

FINDINGS: Right Kidney:

Renal measurements: 11.3 x 4.3 x 4.3 cm = volume: 1 mL. There is no
hydronephrosis. There is increased cortical echogenicity. There is
3.6 cm cyst in the midportion of right kidney.

Left Kidney:

Renal measurements: 11 x 4.3 x 4.6 cm = volume: 100 mL. There is no
hydronephrosis. There is increased cortical echogenicity.

Bladder:

Appears normal for degree of bladder distention.

Other:

None.
IMPRESSION: There is no hydronephrosis. There is increased cortical echogenicity
suggesting medical renal disease. There is 3.6 cm cyst in the
midportion of right kidney.

## 2023-11-14 IMAGING — US US CAROTID DUPLEX BILAT
1 series · 13 of 24 positions shown · non-contrast
Comparison: None available

CLINICAL DATA: Right carotid bruit

EXAM:
BILATERAL CAROTID DUPLEX ULTRASOUND
TECHNIQUE: Gray scale imaging, color Doppler and duplex ultrasound were
performed of bilateral carotid and vertebral arteries in the neck.

[Series 1: us carotid duplex bilat · 0.06mm/px · 13 of 58 slices shown]
[im 1/58]
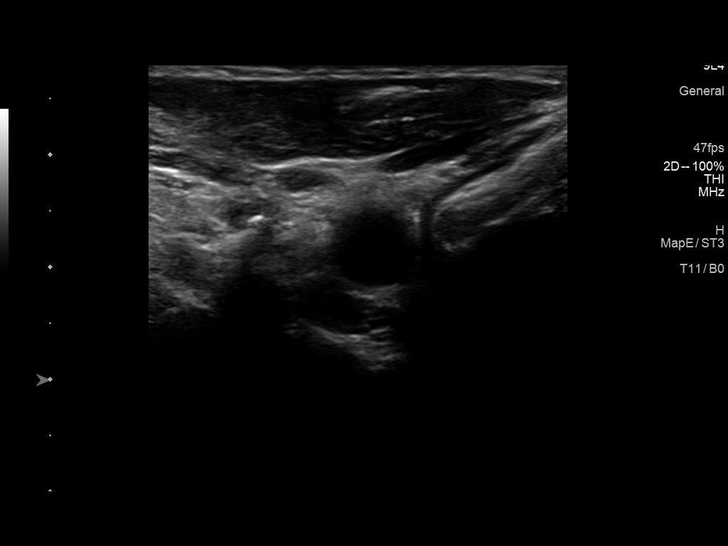
[im 5/58]
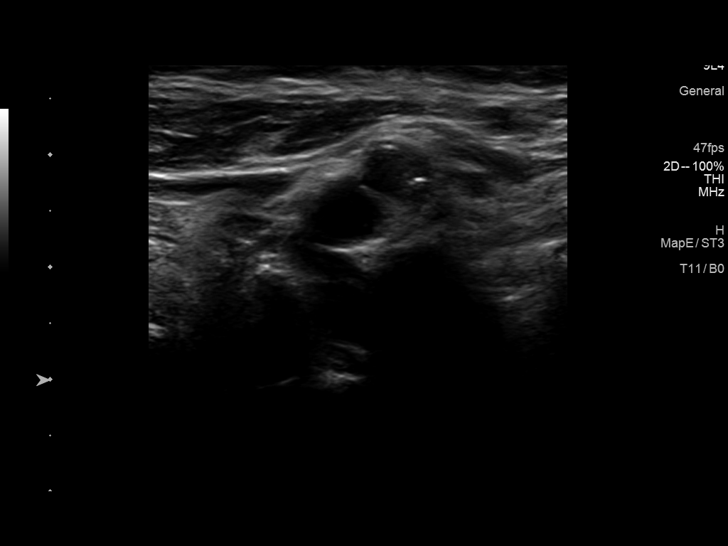
[im 10/58]
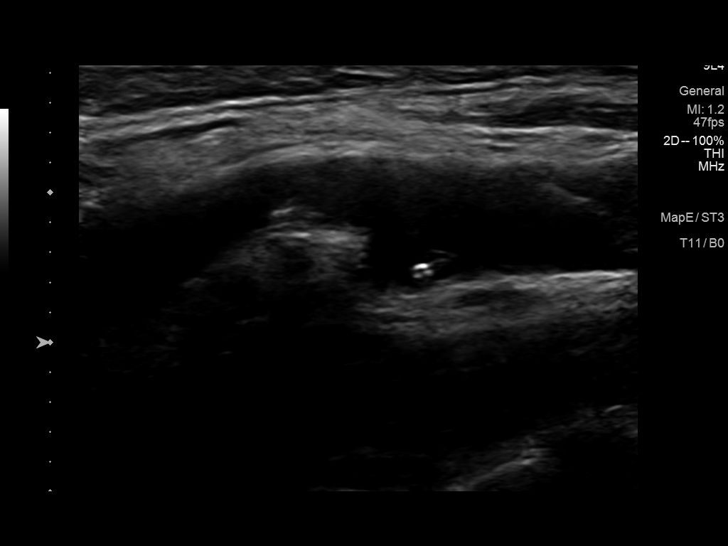
[im 15/58]
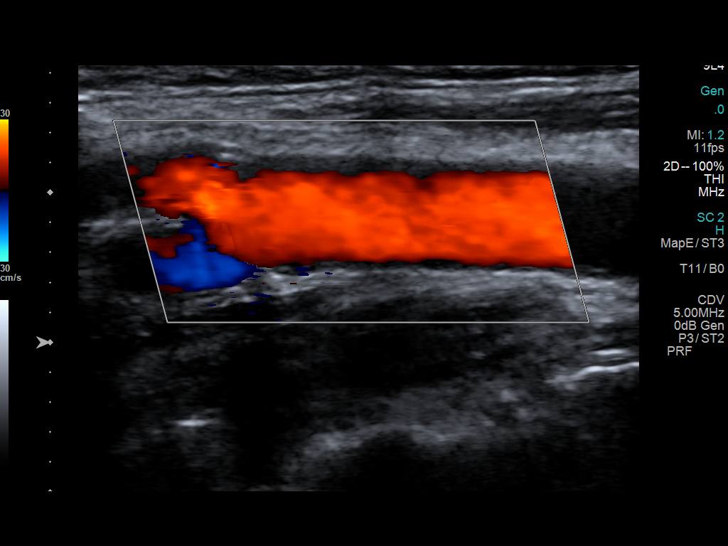
[im 20/58]
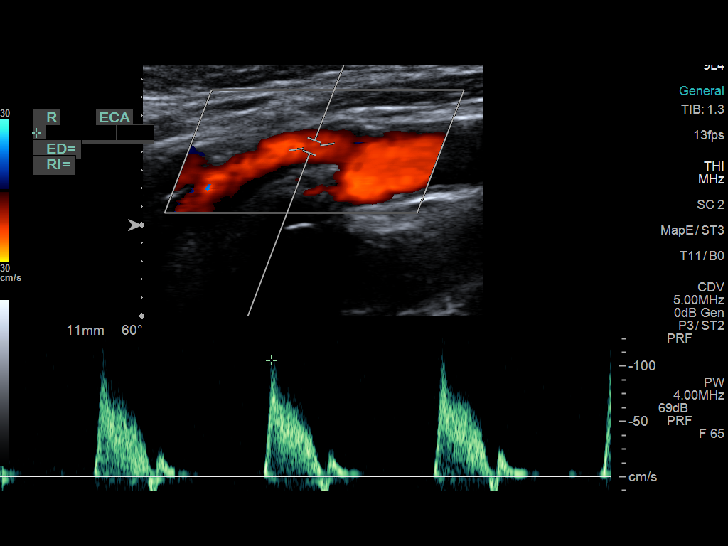
[im 25/58]
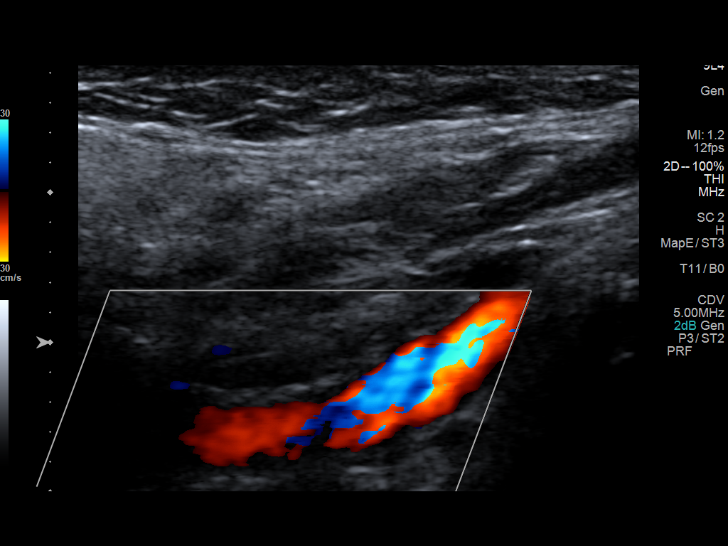
[im 30/58]
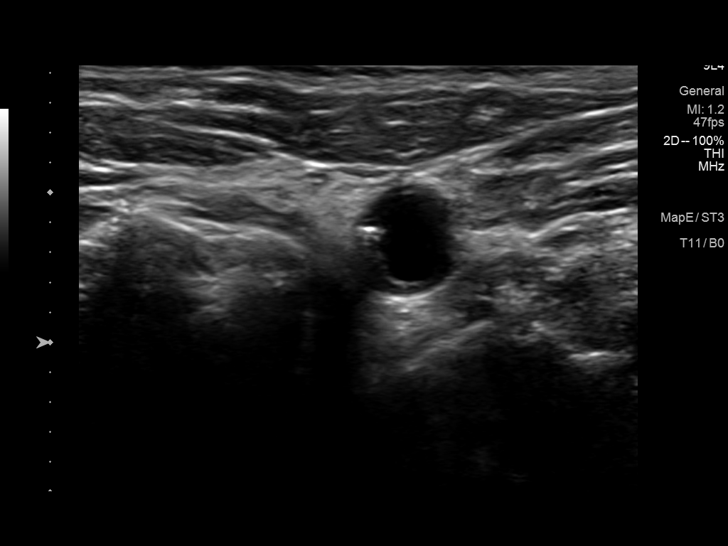
[im 33/58]
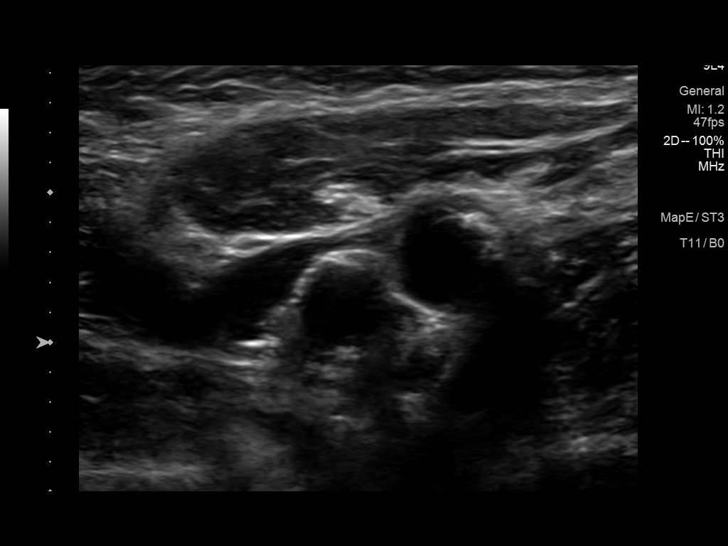
[im 38/58]
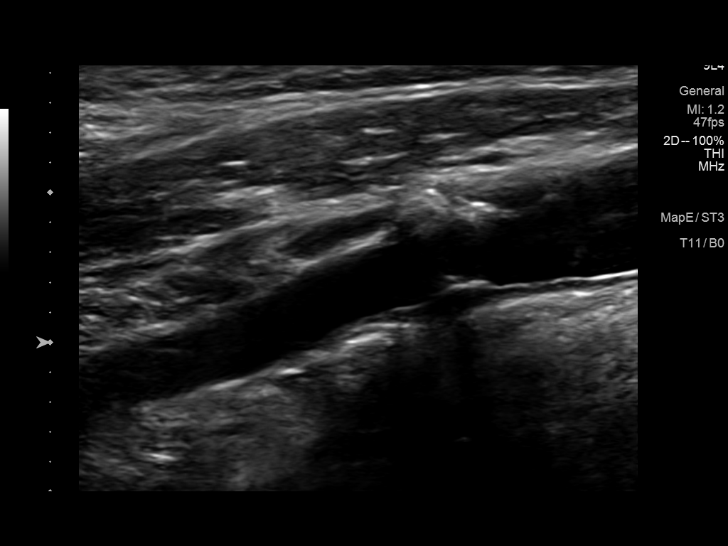
[im 43/58]
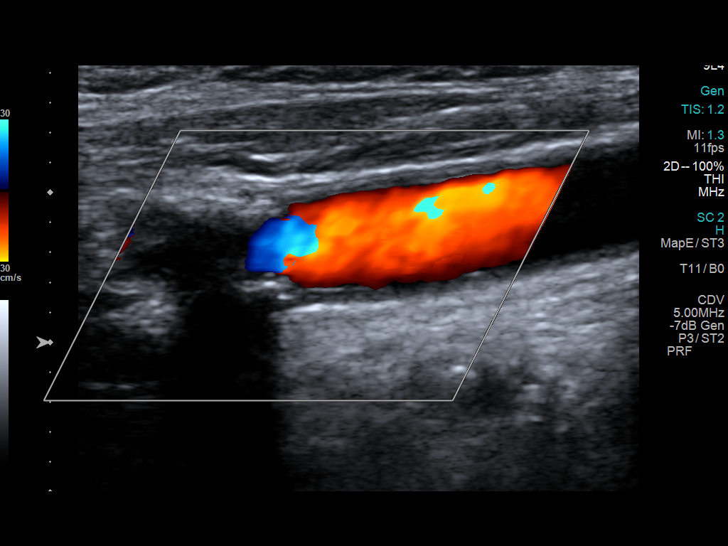
[im 48/58]
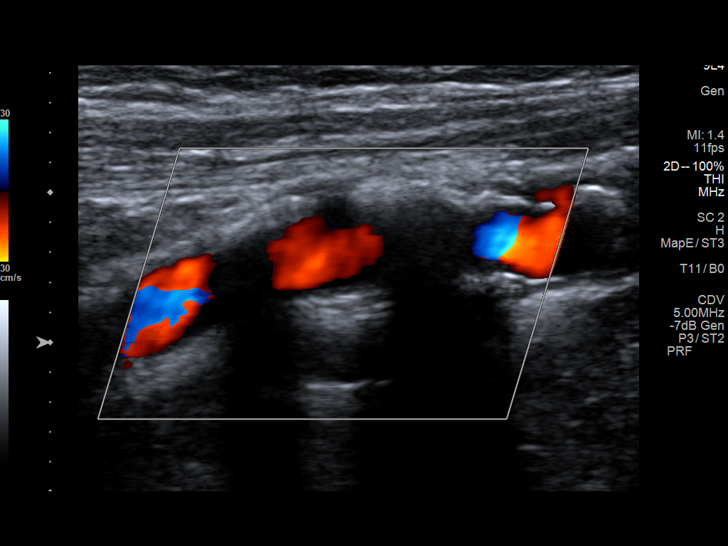
[im 53/58]
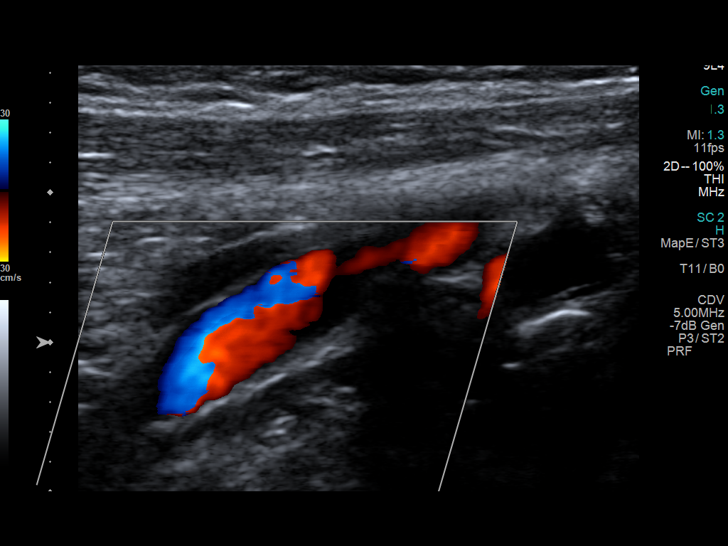
[im 58/58]
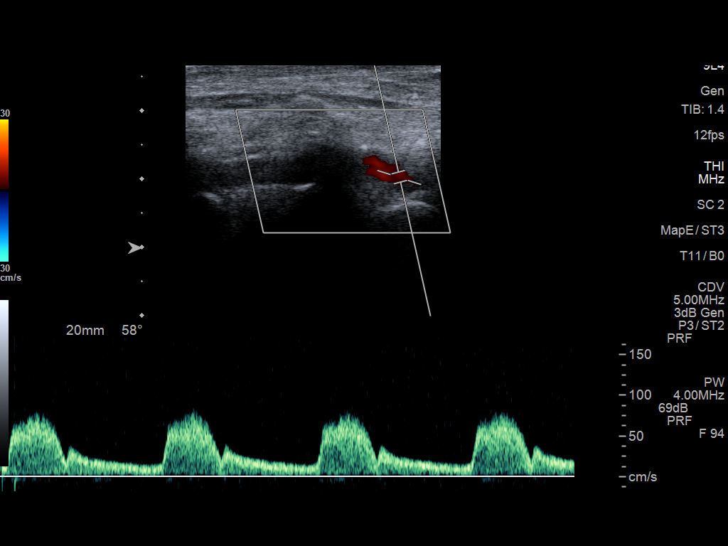

[13 of 24 positions shown; findings below may reference images not displayed]

FINDINGS: Criteria: Quantification of carotid stenosis is based on velocity
parameters that correlate the residual internal carotid diameter
with NASCET-based stenosis levels, using the diameter of the distal
internal carotid lumen as the denominator for stenosis measurement.

The following velocity measurements were obtained:

RIGHT

ICA: 135/25 cm/sec

CCA: 107/11 cm/sec

SYSTOLIC ICA/CCA RATIO:

ECA: 105 cm/sec

LEFT

ICA: 118/24 cm/sec

CCA: 123/15 cm/sec

SYSTOLIC ICA/CCA RATIO:

ECA: 151 cm/sec

RIGHT CAROTID ARTERY: Minimal atheromatous plaque of the right
carotid bifurcation.

RIGHT VERTEBRAL ARTERY:  Antegrade flow.

LEFT CAROTID ARTERY: Moderate calcified plaque of the left proximal
common carotid artery. Moderate calcified plaque of the left carotid
bifurcation.

LEFT VERTEBRAL ARTERY:  Antegrade flow.
IMPRESSION: 1. Minimal atheromatous plaque of the right carotid bifurcation with
less than 50% stenosis.
2. Moderate calcified plaque of the common and proximal internal
carotid arteries with Doppler parameters indicative of less than 50%
stenosis.
